# Patient Record
Sex: Female | Born: 1945 | Race: White | Hispanic: No | Marital: Married | Smoking: Never smoker
Health system: Southern US, Community
[De-identification: ages and names within clinical notes are randomized; demographics above are authoritative.]

## PROBLEM LIST (undated history)

## (undated) ENCOUNTER — Emergency Department (HOSPITAL_BASED_OUTPATIENT_CLINIC_OR_DEPARTMENT_OTHER): Payer: Medicare Other | Source: Home / Self Care

## (undated) DIAGNOSIS — T884XXA Failed or difficult intubation, initial encounter: Secondary | ICD-10-CM

## (undated) DIAGNOSIS — C4339 Malignant melanoma of other parts of face: Secondary | ICD-10-CM

## (undated) DIAGNOSIS — E039 Hypothyroidism, unspecified: Secondary | ICD-10-CM

## (undated) DIAGNOSIS — G4700 Insomnia, unspecified: Secondary | ICD-10-CM

## (undated) DIAGNOSIS — I639 Cerebral infarction, unspecified: Secondary | ICD-10-CM

## (undated) DIAGNOSIS — I10 Essential (primary) hypertension: Secondary | ICD-10-CM

## (undated) DIAGNOSIS — D45 Polycythemia vera: Secondary | ICD-10-CM

## (undated) DIAGNOSIS — I491 Atrial premature depolarization: Secondary | ICD-10-CM

## (undated) DIAGNOSIS — I358 Other nonrheumatic aortic valve disorders: Secondary | ICD-10-CM

## (undated) DIAGNOSIS — R0609 Other forms of dyspnea: Secondary | ICD-10-CM

## (undated) DIAGNOSIS — K219 Gastro-esophageal reflux disease without esophagitis: Secondary | ICD-10-CM

## (undated) DIAGNOSIS — M797 Fibromyalgia: Secondary | ICD-10-CM

## (undated) DIAGNOSIS — G8929 Other chronic pain: Secondary | ICD-10-CM

## (undated) DIAGNOSIS — J45909 Unspecified asthma, uncomplicated: Secondary | ICD-10-CM

## (undated) DIAGNOSIS — R569 Unspecified convulsions: Secondary | ICD-10-CM

## (undated) DIAGNOSIS — I6529 Occlusion and stenosis of unspecified carotid artery: Secondary | ICD-10-CM

## (undated) DIAGNOSIS — K5909 Other constipation: Secondary | ICD-10-CM

## (undated) DIAGNOSIS — E785 Hyperlipidemia, unspecified: Secondary | ICD-10-CM

## (undated) DIAGNOSIS — M549 Dorsalgia, unspecified: Secondary | ICD-10-CM

## (undated) DIAGNOSIS — G40209 Localization-related (focal) (partial) symptomatic epilepsy and epileptic syndromes with complex partial seizures, not intractable, without status epilepticus: Secondary | ICD-10-CM

## (undated) DIAGNOSIS — Z9289 Personal history of other medical treatment: Secondary | ICD-10-CM

## (undated) DIAGNOSIS — R0600 Dyspnea, unspecified: Secondary | ICD-10-CM

## (undated) DIAGNOSIS — Z9889 Other specified postprocedural states: Secondary | ICD-10-CM

## (undated) DIAGNOSIS — K810 Acute cholecystitis: Principal | ICD-10-CM

## (undated) DIAGNOSIS — R112 Nausea with vomiting, unspecified: Secondary | ICD-10-CM

## (undated) DIAGNOSIS — Z780 Asymptomatic menopausal state: Secondary | ICD-10-CM

## (undated) DIAGNOSIS — F419 Anxiety disorder, unspecified: Secondary | ICD-10-CM

## (undated) DIAGNOSIS — J302 Other seasonal allergic rhinitis: Secondary | ICD-10-CM

## (undated) DIAGNOSIS — D649 Anemia, unspecified: Secondary | ICD-10-CM

## (undated) DIAGNOSIS — R011 Cardiac murmur, unspecified: Secondary | ICD-10-CM

## (undated) HISTORY — DX: Atrial premature depolarization: I49.1

## (undated) HISTORY — DX: Polycythemia vera: D45

## (undated) HISTORY — DX: Occlusion and stenosis of unspecified carotid artery: I65.29

## (undated) HISTORY — DX: Cerebral infarction, unspecified: I63.9

## (undated) HISTORY — PX: TONSILLECTOMY: SUR1361

## (undated) HISTORY — DX: Cardiac murmur, unspecified: R01.1

## (undated) HISTORY — DX: Asymptomatic menopausal state: Z78.0

## (undated) HISTORY — PX: COLONOSCOPY: SHX174

## (undated) HISTORY — DX: Anemia, unspecified: D64.9

## (undated) HISTORY — PX: NASAL SEPTUM SURGERY: SHX37

## (undated) HISTORY — DX: Hypothyroidism, unspecified: E03.9

## (undated) HISTORY — PX: OTHER SURGICAL HISTORY: SHX169

## (undated) HISTORY — DX: Gastro-esophageal reflux disease without esophagitis: K21.9

## (undated) HISTORY — DX: Unspecified convulsions: R56.9

## (undated) HISTORY — DX: Acute cholecystitis: K81.0

## (undated) HISTORY — DX: Other nonrheumatic aortic valve disorders: I35.8

## (undated) HISTORY — DX: Hyperlipidemia, unspecified: E78.5

## (undated) HISTORY — DX: Fibromyalgia: M79.7

## (undated) HISTORY — DX: Essential (primary) hypertension: I10

## (undated) HISTORY — PX: TUBAL LIGATION: SHX77

## (undated) HISTORY — DX: Unspecified asthma, uncomplicated: J45.909

## (undated) HISTORY — DX: Localization-related (focal) (partial) symptomatic epilepsy and epileptic syndromes with complex partial seizures, not intractable, without status epilepticus: G40.209

## (undated) HISTORY — DX: Malignant melanoma of other parts of face: C43.39

## (undated) SURGERY — COLONOSCOPY
Anesthesia: Moderate Sedation

---

## 1994-03-29 HISTORY — PX: ABDOMINAL HYSTERECTOMY: SHX81

## 1998-08-08 ENCOUNTER — Ambulatory Visit (HOSPITAL_COMMUNITY): Admission: RE | Admit: 1998-08-08 | Discharge: 1998-08-08 | Payer: Self-pay | Admitting: Gastroenterology

## 1999-03-30 DIAGNOSIS — C4339 Malignant melanoma of other parts of face: Secondary | ICD-10-CM

## 1999-03-30 HISTORY — DX: Malignant melanoma of other parts of face: C43.39

## 2000-04-08 ENCOUNTER — Encounter: Admission: RE | Admit: 2000-04-08 | Discharge: 2000-04-08 | Payer: Self-pay | Admitting: Hematology & Oncology

## 2000-04-08 ENCOUNTER — Encounter: Payer: Self-pay | Admitting: Hematology & Oncology

## 2003-01-02 ENCOUNTER — Inpatient Hospital Stay (HOSPITAL_COMMUNITY): Admission: AD | Admit: 2003-01-02 | Discharge: 2003-01-04 | Payer: Self-pay | Admitting: Hematology & Oncology

## 2003-01-02 ENCOUNTER — Encounter: Payer: Self-pay | Admitting: Hematology & Oncology

## 2003-04-05 ENCOUNTER — Inpatient Hospital Stay (HOSPITAL_COMMUNITY): Admission: AD | Admit: 2003-04-05 | Discharge: 2003-04-07 | Payer: Self-pay | Admitting: Hematology & Oncology

## 2003-05-06 ENCOUNTER — Ambulatory Visit (HOSPITAL_COMMUNITY): Admission: RE | Admit: 2003-05-06 | Discharge: 2003-05-06 | Payer: Self-pay | Admitting: Infectious Diseases

## 2003-05-06 ENCOUNTER — Encounter: Admission: RE | Admit: 2003-05-06 | Discharge: 2003-05-06 | Payer: Self-pay | Admitting: Infectious Diseases

## 2003-05-09 ENCOUNTER — Ambulatory Visit (HOSPITAL_COMMUNITY): Admission: RE | Admit: 2003-05-09 | Discharge: 2003-05-09 | Payer: Self-pay | Admitting: Infectious Diseases

## 2003-05-21 ENCOUNTER — Encounter: Admission: RE | Admit: 2003-05-21 | Discharge: 2003-05-21 | Payer: Self-pay | Admitting: Infectious Diseases

## 2003-06-12 ENCOUNTER — Encounter: Admission: RE | Admit: 2003-06-12 | Discharge: 2003-06-12 | Payer: Self-pay | Admitting: Infectious Diseases

## 2003-07-10 ENCOUNTER — Encounter: Admission: RE | Admit: 2003-07-10 | Discharge: 2003-07-10 | Payer: Self-pay | Admitting: Cardiology

## 2003-10-02 ENCOUNTER — Inpatient Hospital Stay (HOSPITAL_COMMUNITY): Admission: RE | Admit: 2003-10-02 | Discharge: 2003-10-04 | Payer: Self-pay | Admitting: Hematology & Oncology

## 2004-01-28 ENCOUNTER — Ambulatory Visit: Payer: Self-pay | Admitting: Hematology & Oncology

## 2004-03-16 ENCOUNTER — Ambulatory Visit: Payer: Self-pay | Admitting: Hematology & Oncology

## 2004-04-01 ENCOUNTER — Inpatient Hospital Stay (HOSPITAL_COMMUNITY): Admission: AD | Admit: 2004-04-01 | Discharge: 2004-04-03 | Payer: Self-pay | Admitting: Hematology & Oncology

## 2004-04-03 ENCOUNTER — Ambulatory Visit: Payer: Self-pay | Admitting: Hematology & Oncology

## 2004-05-13 ENCOUNTER — Ambulatory Visit: Payer: Self-pay | Admitting: Hematology & Oncology

## 2004-07-07 ENCOUNTER — Ambulatory Visit: Payer: Self-pay | Admitting: Hematology & Oncology

## 2004-09-01 ENCOUNTER — Ambulatory Visit: Payer: Self-pay | Admitting: Hematology & Oncology

## 2004-09-30 ENCOUNTER — Inpatient Hospital Stay (HOSPITAL_COMMUNITY): Admission: AD | Admit: 2004-09-30 | Discharge: 2004-10-02 | Payer: Self-pay | Admitting: Hematology & Oncology

## 2004-10-05 ENCOUNTER — Ambulatory Visit: Payer: Self-pay | Admitting: Hematology & Oncology

## 2004-11-03 ENCOUNTER — Ambulatory Visit: Payer: Self-pay | Admitting: Hematology & Oncology

## 2004-12-29 ENCOUNTER — Ambulatory Visit: Payer: Self-pay | Admitting: Hematology & Oncology

## 2005-01-01 ENCOUNTER — Ambulatory Visit (HOSPITAL_COMMUNITY): Admission: RE | Admit: 2005-01-01 | Discharge: 2005-01-01 | Payer: Self-pay | Admitting: Hematology & Oncology

## 2005-02-24 ENCOUNTER — Ambulatory Visit: Payer: Self-pay | Admitting: Hematology & Oncology

## 2005-03-31 ENCOUNTER — Inpatient Hospital Stay (HOSPITAL_COMMUNITY): Admission: EM | Admit: 2005-03-31 | Discharge: 2005-04-02 | Payer: Self-pay | Admitting: Hematology & Oncology

## 2005-03-31 ENCOUNTER — Ambulatory Visit: Payer: Self-pay | Admitting: Hematology & Oncology

## 2005-05-18 ENCOUNTER — Ambulatory Visit: Payer: Self-pay | Admitting: Hematology & Oncology

## 2005-06-30 LAB — CBC WITH DIFFERENTIAL/PLATELET
BASO%: 1.5 % (ref 0.0–2.0)
EOS%: 4.7 % (ref 0.0–7.0)
MCH: 14.2 pg — ABNORMAL LOW (ref 26.0–34.0)
MCHC: 25.9 g/dL — ABNORMAL LOW (ref 32.0–36.0)
MONO#: 0.5 10*3/uL (ref 0.1–0.9)
RBC: 4.48 10*6/uL (ref 3.70–5.32)
RDW: 17.3 % — ABNORMAL HIGH (ref 11.3–14.5)
WBC: 4.5 10*3/uL (ref 3.9–10.0)
lymph#: 0.8 10*3/uL — ABNORMAL LOW (ref 0.9–3.3)

## 2005-07-13 ENCOUNTER — Ambulatory Visit: Payer: Self-pay | Admitting: Hematology & Oncology

## 2005-07-14 LAB — COMPREHENSIVE METABOLIC PANEL
AST: 12 U/L (ref 0–37)
Albumin: 4.7 g/dL (ref 3.5–5.2)
Alkaline Phosphatase: 59 U/L (ref 39–117)
Potassium: 4.2 mEq/L (ref 3.5–5.3)
Sodium: 136 mEq/L (ref 135–145)
Total Bilirubin: 0.5 mg/dL (ref 0.3–1.2)
Total Protein: 7.2 g/dL (ref 6.0–8.3)

## 2005-07-14 LAB — CBC WITH DIFFERENTIAL/PLATELET
Basophils Absolute: 0 10*3/uL (ref 0.0–0.1)
EOS%: 3.9 % (ref 0.0–7.0)
HGB: 6.7 g/dL — CL (ref 11.6–15.9)
MCH: 14.1 pg — ABNORMAL LOW (ref 26.0–34.0)
MONO#: 0.6 10*3/uL (ref 0.1–0.9)
NEUT#: 2.8 10*3/uL (ref 1.5–6.5)
RDW: 16.9 % — ABNORMAL HIGH (ref 11.3–14.5)
WBC: 4.5 10*3/uL (ref 3.9–10.0)
lymph#: 0.9 10*3/uL (ref 0.9–3.3)

## 2005-07-28 LAB — CBC WITH DIFFERENTIAL/PLATELET
Basophils Absolute: 0 10*3/uL (ref 0.0–0.1)
EOS%: 8.5 % — ABNORMAL HIGH (ref 0.0–7.0)
Eosinophils Absolute: 0.4 10*3/uL (ref 0.0–0.5)
HCT: 24.3 % — ABNORMAL LOW (ref 34.8–46.6)
HGB: 6.4 g/dL — CL (ref 11.6–15.9)
MCH: 14.5 pg — ABNORMAL LOW (ref 26.0–34.0)
MCV: 55.1 fL — ABNORMAL LOW (ref 81.0–101.0)
MONO%: 12.9 % (ref 0.0–13.0)
NEUT#: 2.9 10*3/uL (ref 1.5–6.5)
NEUT%: 61 % (ref 39.6–76.8)
Platelets: 213 10*3/uL (ref 145–400)

## 2005-08-11 LAB — CBC WITH DIFFERENTIAL/PLATELET
Basophils Absolute: 0.1 10*3/uL (ref 0.0–0.1)
EOS%: 9.6 % — ABNORMAL HIGH (ref 0.0–7.0)
Eosinophils Absolute: 0.5 10*3/uL (ref 0.0–0.5)
LYMPH%: 16.3 % (ref 14.0–48.0)
MCH: 14.7 pg — ABNORMAL LOW (ref 26.0–34.0)
MCV: 55.3 fL — ABNORMAL LOW (ref 81.0–101.0)
MONO%: 9.4 % (ref 0.0–13.0)
NEUT#: 3.5 10*3/uL (ref 1.5–6.5)
Platelets: 332 10*3/uL (ref 145–400)
RBC: 4.88 10*6/uL (ref 3.70–5.32)
RDW: 17.5 % — ABNORMAL HIGH (ref 11.3–14.5)

## 2005-08-21 ENCOUNTER — Ambulatory Visit: Payer: Self-pay | Admitting: Hematology & Oncology

## 2005-08-26 LAB — CBC WITH DIFFERENTIAL/PLATELET
BASO%: 0 % (ref 0.0–2.0)
LYMPH%: 12.2 % — ABNORMAL LOW (ref 14.0–48.0)
MCHC: 27.9 g/dL — ABNORMAL LOW (ref 32.0–36.0)
MCV: 54.7 fL — ABNORMAL LOW (ref 81.0–101.0)
MONO%: 12.5 % (ref 0.0–13.0)
NEUT#: 3.6 10*3/uL (ref 1.5–6.5)
Platelets: 285 10*3/uL (ref 145–400)
RBC: 4.16 10*6/uL (ref 3.70–5.32)
RDW: 23.5 % — ABNORMAL HIGH (ref 11.3–14.5)
WBC: 5.1 10*3/uL (ref 3.9–10.0)

## 2005-08-26 LAB — TECHNOLOGIST REVIEW

## 2005-09-08 LAB — CBC WITH DIFFERENTIAL/PLATELET
BASO%: 0 % (ref 0.0–2.0)
Basophils Absolute: 0 10*3/uL (ref 0.0–0.1)
EOS%: 2.9 % (ref 0.0–7.0)
HGB: 6.4 g/dL — CL (ref 11.6–15.9)
MCH: 15.3 pg — ABNORMAL LOW (ref 26.0–34.0)
MCV: 54.8 fL — ABNORMAL LOW (ref 81.0–101.0)
MONO%: 15.8 % — ABNORMAL HIGH (ref 0.0–13.0)
RBC: 4.22 10*6/uL (ref 3.70–5.32)
RDW: 23.5 % — ABNORMAL HIGH (ref 11.3–14.5)
lymph#: 0.8 10*3/uL — ABNORMAL LOW (ref 0.9–3.3)

## 2005-09-08 LAB — TECHNOLOGIST REVIEW

## 2005-09-22 LAB — CBC WITH DIFFERENTIAL/PLATELET
BASO%: 0.7 % (ref 0.0–2.0)
Eosinophils Absolute: 0.2 10*3/uL (ref 0.0–0.5)
LYMPH%: 16.5 % (ref 14.0–48.0)
MCHC: 25.5 g/dL — ABNORMAL LOW (ref 32.0–36.0)
MONO#: 0.5 10*3/uL (ref 0.1–0.9)
NEUT#: 3.1 10*3/uL (ref 1.5–6.5)
Platelets: 267 10*3/uL (ref 145–400)
RBC: 4.53 10*6/uL (ref 3.70–5.32)
RDW: 16.6 % — ABNORMAL HIGH (ref 11.3–14.5)
WBC: 4.6 10*3/uL (ref 3.9–10.0)
lymph#: 0.8 10*3/uL — ABNORMAL LOW (ref 0.9–3.3)

## 2005-09-27 ENCOUNTER — Ambulatory Visit: Payer: Self-pay | Admitting: Hematology & Oncology

## 2005-09-27 ENCOUNTER — Inpatient Hospital Stay (HOSPITAL_COMMUNITY): Admission: EM | Admit: 2005-09-27 | Discharge: 2005-09-29 | Payer: Self-pay | Admitting: Hematology & Oncology

## 2005-10-06 ENCOUNTER — Ambulatory Visit: Payer: Self-pay | Admitting: Hematology & Oncology

## 2005-10-06 LAB — CBC WITH DIFFERENTIAL/PLATELET
BASO%: 1.2 % (ref 0.0–2.0)
Basophils Absolute: 0.1 10*3/uL (ref 0.0–0.1)
EOS%: 6.3 % (ref 0.0–7.0)
HCT: 25 % — ABNORMAL LOW (ref 34.8–46.6)
HGB: 6.6 g/dL — CL (ref 11.6–15.9)
LYMPH%: 18.6 % (ref 14.0–48.0)
MCH: 14.6 pg — ABNORMAL LOW (ref 26.0–34.0)
MCHC: 26.6 g/dL — ABNORMAL LOW (ref 32.0–36.0)
MCV: 55 fL — ABNORMAL LOW (ref 81.0–101.0)
NEUT%: 60.5 % (ref 39.6–76.8)
Platelets: 325 10*3/uL (ref 145–400)

## 2005-10-20 LAB — CBC WITH DIFFERENTIAL/PLATELET
BASO%: 0.7 % (ref 0.0–2.0)
Basophils Absolute: 0 10*3/uL (ref 0.0–0.1)
EOS%: 4.3 % (ref 0.0–7.0)
MCH: 13.8 pg — ABNORMAL LOW (ref 26.0–34.0)
MCHC: 25.3 g/dL — ABNORMAL LOW (ref 32.0–36.0)
MCV: 54.7 fL — ABNORMAL LOW (ref 81.0–101.0)
MONO%: 10.8 % (ref 0.0–13.0)
NEUT%: 68.6 % (ref 39.6–76.8)
RDW: 16.2 % — ABNORMAL HIGH (ref 11.3–14.5)
lymph#: 0.8 10*3/uL — ABNORMAL LOW (ref 0.9–3.3)

## 2005-11-03 LAB — CBC WITH DIFFERENTIAL/PLATELET
Basophils Absolute: 0 10*3/uL (ref 0.0–0.1)
Eosinophils Absolute: 0.3 10*3/uL (ref 0.0–0.5)
HGB: 5.9 g/dL — CL (ref 11.6–15.9)
MCV: 55.2 fL — ABNORMAL LOW (ref 81.0–101.0)
MONO#: 0.5 10*3/uL (ref 0.1–0.9)
MONO%: 12 % (ref 0.0–13.0)
NEUT#: 2.6 10*3/uL (ref 1.5–6.5)
RBC: 4.38 10*6/uL (ref 3.70–5.32)
RDW: 16.7 % — ABNORMAL HIGH (ref 11.3–14.5)
WBC: 4.4 10*3/uL (ref 3.9–10.0)
lymph#: 0.9 10*3/uL (ref 0.9–3.3)

## 2005-11-17 LAB — CBC WITH DIFFERENTIAL/PLATELET
Basophils Absolute: 0.1 10*3/uL (ref 0.0–0.1)
Eosinophils Absolute: 0.2 10*3/uL (ref 0.0–0.5)
HGB: 6.6 g/dL — CL (ref 11.6–15.9)
LYMPH%: 14.6 % (ref 14.0–48.0)
MCV: 55 fL — ABNORMAL LOW (ref 81.0–101.0)
MONO#: 0.7 10*3/uL (ref 0.1–0.9)
MONO%: 13 % (ref 0.0–13.0)
NEUT#: 3.5 10*3/uL (ref 1.5–6.5)
Platelets: 294 10*3/uL (ref 145–400)
RBC: 4.75 10*6/uL (ref 3.70–5.32)
WBC: 5.1 10*3/uL (ref 3.9–10.0)

## 2005-11-30 ENCOUNTER — Ambulatory Visit: Payer: Self-pay | Admitting: Hematology & Oncology

## 2005-12-01 LAB — CBC WITH DIFFERENTIAL/PLATELET
Eosinophils Absolute: 0.3 10*3/uL (ref 0.0–0.5)
HCT: 26.3 % — ABNORMAL LOW (ref 34.8–46.6)
LYMPH%: 18.3 % (ref 14.0–48.0)
MCHC: 25.5 g/dL — ABNORMAL LOW (ref 32.0–36.0)
MCV: 55.4 fL — ABNORMAL LOW (ref 81.0–101.0)
MONO#: 0.6 10*3/uL (ref 0.1–0.9)
MONO%: 12.9 % (ref 0.0–13.0)
NEUT#: 3 10*3/uL (ref 1.5–6.5)
NEUT%: 61.3 % (ref 39.6–76.8)
Platelets: 282 10*3/uL (ref 145–400)
WBC: 4.9 10*3/uL (ref 3.9–10.0)

## 2005-12-15 LAB — CBC WITH DIFFERENTIAL/PLATELET
BASO%: 1.2 % (ref 0.0–2.0)
EOS%: 6.7 % (ref 0.0–7.0)
HCT: 26.9 % — ABNORMAL LOW (ref 34.8–46.6)
LYMPH%: 15.4 % (ref 14.0–48.0)
MCH: 13.6 pg — ABNORMAL LOW (ref 26.0–34.0)
MCHC: 25 g/dL — ABNORMAL LOW (ref 32.0–36.0)
MONO%: 8.9 % (ref 0.0–13.0)
NEUT%: 67.8 % (ref 39.6–76.8)
Platelets: 281 10*3/uL (ref 145–400)
RBC: 4.93 10*6/uL (ref 3.70–5.32)

## 2006-01-24 ENCOUNTER — Ambulatory Visit: Payer: Self-pay | Admitting: Hematology & Oncology

## 2006-02-09 LAB — CBC WITH DIFFERENTIAL/PLATELET
BASO%: 1.1 % (ref 0.0–2.0)
EOS%: 7.9 % — ABNORMAL HIGH (ref 0.0–7.0)
HCT: 25.4 % — ABNORMAL LOW (ref 34.8–46.6)
LYMPH%: 14.9 % (ref 14.0–48.0)
MCH: 13.8 pg — ABNORMAL LOW (ref 26.0–34.0)
MCHC: 24.5 g/dL — ABNORMAL LOW (ref 32.0–36.0)
MCV: 56.2 fL — ABNORMAL LOW (ref 81.0–101.0)
MONO%: 10.9 % (ref 0.0–13.0)
NEUT%: 65.2 % (ref 39.6–76.8)
lymph#: 0.7 10*3/uL — ABNORMAL LOW (ref 0.9–3.3)

## 2006-02-24 ENCOUNTER — Ambulatory Visit: Payer: Self-pay | Admitting: Hematology & Oncology

## 2006-02-24 LAB — CBC & DIFF AND RETIC
Basophils Absolute: 0 10*3/uL (ref 0.0–0.1)
EOS%: 5.4 % (ref 0.0–7.0)
Eosinophils Absolute: 0.3 10*3/uL (ref 0.0–0.5)
HCT: 26.6 % — ABNORMAL LOW (ref 34.8–46.6)
HGB: 6.6 g/dL — CL (ref 11.6–15.9)
IRF: 0.45 — ABNORMAL HIGH (ref 0.130–0.330)
MCH: 13.8 pg — ABNORMAL LOW (ref 26.0–34.0)
NEUT#: 3.4 10*3/uL (ref 1.5–6.5)
NEUT%: 67.5 % (ref 39.6–76.8)
RDW: 16 % — ABNORMAL HIGH (ref 11.3–14.5)
RETIC #: 89.8 10*3/uL (ref 19.7–115.1)
lymph#: 0.8 10*3/uL — ABNORMAL LOW (ref 0.9–3.3)

## 2006-02-24 LAB — FERRITIN: Ferritin: <1 ng/mL — ABNORMAL LOW (ref 10–291)

## 2006-03-09 LAB — CBC WITH DIFFERENTIAL/PLATELET
Eosinophils Absolute: 0.4 10*3/uL (ref 0.0–0.5)
HCT: 27.6 % — ABNORMAL LOW (ref 34.8–46.6)
LYMPH%: 17 % (ref 14.0–48.0)
MONO#: 0.7 10*3/uL (ref 0.1–0.9)
NEUT#: 3.6 10*3/uL (ref 1.5–6.5)
NEUT%: 64.4 % (ref 39.6–76.8)
Platelets: 316 10*3/uL (ref 145–400)
WBC: 5.6 10*3/uL (ref 3.9–10.0)

## 2006-03-23 LAB — CBC WITH DIFFERENTIAL/PLATELET
BASO%: 1 % (ref 0.0–2.0)
EOS%: 8.8 % — ABNORMAL HIGH (ref 0.0–7.0)
HCT: 24.1 % — ABNORMAL LOW (ref 34.8–46.6)
LYMPH%: 14.8 % (ref 14.0–48.0)
MCH: 13.8 pg — ABNORMAL LOW (ref 26.0–34.0)
MCHC: 25 g/dL — ABNORMAL LOW (ref 32.0–36.0)
MONO#: 0.6 10*3/uL (ref 0.1–0.9)
NEUT%: 66.5 % (ref 39.6–76.8)
Platelets: 305 10*3/uL (ref 145–400)

## 2006-03-30 ENCOUNTER — Ambulatory Visit: Payer: Self-pay | Admitting: Hematology & Oncology

## 2006-03-30 ENCOUNTER — Inpatient Hospital Stay (HOSPITAL_COMMUNITY): Admission: EM | Admit: 2006-03-30 | Discharge: 2006-04-01 | Payer: Self-pay | Admitting: Hematology & Oncology

## 2006-04-06 ENCOUNTER — Ambulatory Visit: Payer: Self-pay | Admitting: Hematology & Oncology

## 2006-04-06 LAB — CBC WITH DIFFERENTIAL/PLATELET
BASO%: 0.6 % (ref 0.0–2.0)
Basophils Absolute: 0 10*3/uL (ref 0.0–0.1)
HCT: 24 % — ABNORMAL LOW (ref 34.8–46.6)
HGB: 5.9 g/dL — CL (ref 11.6–15.9)
LYMPH%: 17 % (ref 14.0–48.0)
MCH: 13.5 pg — ABNORMAL LOW (ref 26.0–34.0)
MCHC: 24.4 g/dL — ABNORMAL LOW (ref 32.0–36.0)
MONO#: 0.6 10*3/uL (ref 0.1–0.9)
NEUT%: 59 % (ref 39.6–76.8)
Platelets: 268 10*3/uL (ref 145–400)
WBC: 5 10*3/uL (ref 3.9–10.0)

## 2006-04-20 LAB — CBC WITH DIFFERENTIAL/PLATELET
BASO%: 0.6 % (ref 0.0–2.0)
Basophils Absolute: 0 10*3/uL (ref 0.0–0.1)
EOS%: 10.3 % — ABNORMAL HIGH (ref 0.0–7.0)
HCT: 26.1 % — ABNORMAL LOW (ref 34.8–46.6)
LYMPH%: 19.7 % (ref 14.0–48.0)
MCH: 13.6 pg — ABNORMAL LOW (ref 26.0–34.0)
MCHC: 24 g/dL — ABNORMAL LOW (ref 32.0–36.0)
MCV: 56.5 fL — ABNORMAL LOW (ref 81.0–101.0)
MONO%: 12.5 % (ref 0.0–13.0)
NEUT%: 56.8 % (ref 39.6–76.8)
lymph#: 0.8 10*3/uL — ABNORMAL LOW (ref 0.9–3.3)

## 2006-05-04 LAB — CBC WITH DIFFERENTIAL/PLATELET
EOS%: 9.6 % — ABNORMAL HIGH (ref 0.0–7.0)
Eosinophils Absolute: 0.4 10*3/uL (ref 0.0–0.5)
MCV: 56.3 fL — ABNORMAL LOW (ref 81.0–101.0)
MONO%: 11.2 % (ref 0.0–13.0)
NEUT#: 2.7 10*3/uL (ref 1.5–6.5)
RBC: 4.8 10*6/uL (ref 3.70–5.32)
RDW: 15.7 % — ABNORMAL HIGH (ref 11.3–14.5)
WBC: 4.4 10*3/uL (ref 3.9–10.0)

## 2006-05-16 ENCOUNTER — Ambulatory Visit: Payer: Self-pay | Admitting: Hematology & Oncology

## 2006-05-18 LAB — CBC WITH DIFFERENTIAL/PLATELET
Eosinophils Absolute: 0.4 10*3/uL (ref 0.0–0.5)
MONO#: 0.6 10*3/uL (ref 0.1–0.9)
NEUT#: 3.2 10*3/uL (ref 1.5–6.5)
Platelets: 257 10*3/uL (ref 145–400)
RBC: 4.75 10*6/uL (ref 3.70–5.32)
RDW: 16.1 % — ABNORMAL HIGH (ref 11.3–14.5)
WBC: 5 10*3/uL (ref 3.9–10.0)
lymph#: 0.8 10*3/uL — ABNORMAL LOW (ref 0.9–3.3)

## 2006-05-18 LAB — COMPREHENSIVE METABOLIC PANEL
Albumin: 4.2 g/dL (ref 3.5–5.2)
CO2: 27 mEq/L (ref 19–32)
Chloride: 103 mEq/L (ref 96–112)
Glucose, Bld: 88 mg/dL (ref 70–99)
Potassium: 4.1 mEq/L (ref 3.5–5.3)
Sodium: 137 mEq/L (ref 135–145)
Total Protein: 6.5 g/dL (ref 6.0–8.3)

## 2006-05-18 LAB — FERRITIN: Ferritin: <1 ng/mL — ABNORMAL LOW (ref 10–291)

## 2006-06-01 LAB — CBC WITH DIFFERENTIAL/PLATELET
BASO%: 0.5 % (ref 0.0–2.0)
EOS%: 11.3 % — ABNORMAL HIGH (ref 0.0–7.0)
LYMPH%: 17.6 % (ref 14.0–48.0)
MCH: 14.1 pg — ABNORMAL LOW (ref 26.0–34.0)
MCHC: 24.9 g/dL — ABNORMAL LOW (ref 32.0–36.0)
MONO#: 0.5 10*3/uL (ref 0.1–0.9)
RBC: 4.21 10*6/uL (ref 3.70–5.32)
WBC: 5 10*3/uL (ref 3.9–10.0)
lymph#: 0.9 10*3/uL (ref 0.9–3.3)

## 2006-06-15 LAB — CBC WITH DIFFERENTIAL/PLATELET
Basophils Absolute: 0 10*3/uL (ref 0.0–0.1)
Eosinophils Absolute: 0.3 10*3/uL (ref 0.0–0.5)
HGB: 6.4 g/dL — CL (ref 11.6–15.9)
MONO#: 0.5 10*3/uL (ref 0.1–0.9)
NEUT#: 3.9 10*3/uL (ref 1.5–6.5)
RDW: 16 % — ABNORMAL HIGH (ref 11.3–14.5)
lymph#: 0.7 10*3/uL — ABNORMAL LOW (ref 0.9–3.3)

## 2006-06-27 ENCOUNTER — Ambulatory Visit: Payer: Self-pay | Admitting: Hematology & Oncology

## 2006-06-29 LAB — CBC WITH DIFFERENTIAL/PLATELET
Basophils Absolute: 0 10*3/uL (ref 0.0–0.1)
Eosinophils Absolute: 0.3 10*3/uL (ref 0.0–0.5)
HCT: 27.1 % — ABNORMAL LOW (ref 34.8–46.6)
HGB: 6.8 g/dL — CL (ref 11.6–15.9)
MCV: 56.1 fL — ABNORMAL LOW (ref 81.0–101.0)
MONO%: 8.8 % (ref 0.0–13.0)
NEUT#: 3.7 10*3/uL (ref 1.5–6.5)
Platelets: 272 10*3/uL (ref 145–400)
RDW: 15.2 % — ABNORMAL HIGH (ref 11.3–14.5)

## 2006-07-13 LAB — CBC WITH DIFFERENTIAL/PLATELET
Basophils Absolute: 0.1 10*3/uL (ref 0.0–0.1)
Eosinophils Absolute: 0.5 10*3/uL (ref 0.0–0.5)
LYMPH%: 15.5 % (ref 14.0–48.0)
MCV: 56 fL — ABNORMAL LOW (ref 81.0–101.0)
MONO%: 11.2 % (ref 0.0–13.0)
NEUT#: 3.8 10*3/uL (ref 1.5–6.5)
Platelets: 322 10*3/uL (ref 145–400)
RBC: 5.13 10*6/uL (ref 3.70–5.32)

## 2006-07-27 LAB — CBC WITH DIFFERENTIAL/PLATELET
BASO%: 0.7 % (ref 0.0–2.0)
EOS%: 7.7 % — ABNORMAL HIGH (ref 0.0–7.0)
LYMPH%: 18.1 % (ref 14.0–48.0)
MCH: 13.5 pg — ABNORMAL LOW (ref 26.0–34.0)
MCHC: 24.3 g/dL — ABNORMAL LOW (ref 32.0–36.0)
MCV: 55.5 fL — ABNORMAL LOW (ref 81.0–101.0)
MONO%: 10.6 % (ref 0.0–13.0)
Platelets: 264 10*3/uL (ref 145–400)
RBC: 4.66 10*6/uL (ref 3.70–5.32)
WBC: 5.2 10*3/uL (ref 3.9–10.0)

## 2006-08-09 ENCOUNTER — Ambulatory Visit: Payer: Self-pay | Admitting: Hematology & Oncology

## 2006-08-10 LAB — CBC WITH DIFFERENTIAL/PLATELET
BASO%: 0.5 % (ref 0.0–2.0)
EOS%: 8.6 % — ABNORMAL HIGH (ref 0.0–7.0)
MCHC: 24.8 g/dL — ABNORMAL LOW (ref 32.0–36.0)
MONO#: 0.5 10*3/uL (ref 0.1–0.9)
RBC: 4.7 10*6/uL (ref 3.70–5.32)
WBC: 4.8 10*3/uL (ref 3.9–10.0)
lymph#: 0.8 10*3/uL — ABNORMAL LOW (ref 0.9–3.3)

## 2006-08-17 LAB — CBC WITH DIFFERENTIAL/PLATELET
BASO%: 0.6 % (ref 0.0–2.0)
Basophils Absolute: 0 10*3/uL (ref 0.0–0.1)
EOS%: 6.2 % (ref 0.0–7.0)
HCT: 25.9 % — ABNORMAL LOW (ref 34.8–46.6)
HGB: 6.4 g/dL — CL (ref 11.6–15.9)
LYMPH%: 21.9 % (ref 14.0–48.0)
MCH: 13.9 pg — ABNORMAL LOW (ref 26.0–34.0)
MCHC: 24.7 g/dL — ABNORMAL LOW (ref 32.0–36.0)
NEUT%: 59 % (ref 39.6–76.8)
Platelets: 336 10*3/uL (ref 145–400)
lymph#: 0.9 10*3/uL (ref 0.9–3.3)

## 2006-08-24 LAB — CBC WITH DIFFERENTIAL/PLATELET
Basophils Absolute: 0 10*3/uL (ref 0.0–0.1)
EOS%: 4 % (ref 0.0–7.0)
Eosinophils Absolute: 0.2 10*3/uL (ref 0.0–0.5)
HGB: 7.1 g/dL — ABNORMAL LOW (ref 11.6–15.9)
LYMPH%: 17.1 % (ref 14.0–48.0)
MCH: 14 pg — ABNORMAL LOW (ref 26.0–34.0)
MCV: 55.7 fL — ABNORMAL LOW (ref 81.0–101.0)
MONO%: 9.5 % (ref 0.0–13.0)
NEUT#: 3.9 10*3/uL (ref 1.5–6.5)
Platelets: 388 10*3/uL (ref 145–400)
RBC: 5.11 10*6/uL (ref 3.70–5.32)

## 2006-09-07 LAB — CBC WITH DIFFERENTIAL/PLATELET
BASO%: 0.5 % (ref 0.0–2.0)
EOS%: 5.3 % (ref 0.0–7.0)
LYMPH%: 16.3 % (ref 14.0–48.0)
MCHC: 23.7 g/dL — ABNORMAL LOW (ref 32.0–36.0)
MCV: 54.9 fL — ABNORMAL LOW (ref 81.0–101.0)
MONO%: 9.4 % (ref 0.0–13.0)
Platelets: 306 10*3/uL (ref 145–400)
RBC: 4.14 10*6/uL (ref 3.70–5.32)
RDW: 14.6 % — ABNORMAL HIGH (ref 11.3–14.5)

## 2006-09-21 LAB — CBC WITH DIFFERENTIAL/PLATELET
BASO%: 0.5 % (ref 0.0–2.0)
Basophils Absolute: 0 10*3/uL (ref 0.0–0.1)
EOS%: 4.7 % (ref 0.0–7.0)
Eosinophils Absolute: 0.2 10*3/uL (ref 0.0–0.5)
HCT: 24.5 % — ABNORMAL LOW (ref 34.8–46.6)
HGB: 6.1 g/dL — CL (ref 11.6–15.9)
LYMPH%: 17.8 % (ref 14.0–48.0)
MCH: 13.5 pg — ABNORMAL LOW (ref 26.0–34.0)
MCHC: 24.8 g/dL — ABNORMAL LOW (ref 32.0–36.0)
MCV: 54.4 fL — ABNORMAL LOW (ref 81.0–101.0)
MONO#: 0.5 10*3/uL (ref 0.1–0.9)
MONO%: 10.8 % (ref 0.0–13.0)
NEUT#: 3.2 10*3/uL (ref 1.5–6.5)
NEUT%: 66.1 % (ref 39.6–76.8)
Platelets: 238 10*3/uL (ref 145–400)
RBC: 4.51 10*6/uL (ref 3.70–5.32)
RDW: 14.6 % — ABNORMAL HIGH (ref 11.3–14.5)
WBC: 4.9 10*3/uL (ref 3.9–10.0)
lymph#: 0.9 10*3/uL (ref 0.9–3.3)

## 2006-09-28 ENCOUNTER — Inpatient Hospital Stay (HOSPITAL_COMMUNITY): Admission: EM | Admit: 2006-09-28 | Discharge: 2006-09-30 | Payer: Self-pay | Admitting: Hematology & Oncology

## 2006-09-28 ENCOUNTER — Ambulatory Visit: Payer: Self-pay | Admitting: Hematology & Oncology

## 2006-10-03 ENCOUNTER — Ambulatory Visit: Payer: Self-pay | Admitting: Hematology & Oncology

## 2006-10-05 LAB — CBC WITH DIFFERENTIAL/PLATELET
BASO%: 0.8 % (ref 0.0–2.0)
Eosinophils Absolute: 0.4 10*3/uL (ref 0.0–0.5)
HCT: 26.1 % — ABNORMAL LOW (ref 34.8–46.6)
MCHC: 24.1 g/dL — ABNORMAL LOW (ref 32.0–36.0)
MONO#: 0.3 10*3/uL (ref 0.1–0.9)
NEUT#: 2.3 10*3/uL (ref 1.5–6.5)
NEUT%: 63.4 % (ref 39.6–76.8)
WBC: 3.6 10*3/uL — ABNORMAL LOW (ref 3.9–10.0)
lymph#: 0.6 10*3/uL — ABNORMAL LOW (ref 0.9–3.3)

## 2006-10-19 LAB — CBC WITH DIFFERENTIAL/PLATELET
BASO%: 1 % (ref 0.0–2.0)
EOS%: 4.1 % (ref 0.0–7.0)
MCH: 13.7 pg — ABNORMAL LOW (ref 26.0–34.0)
MCHC: 24.3 g/dL — ABNORMAL LOW (ref 32.0–36.0)
RDW: 15.3 % — ABNORMAL HIGH (ref 11.3–14.5)
lymph#: 0.9 10*3/uL (ref 0.9–3.3)

## 2006-11-02 LAB — CBC WITH DIFFERENTIAL/PLATELET
Basophils Absolute: 0 10*3/uL (ref 0.0–0.1)
EOS%: 5.3 % (ref 0.0–7.0)
Eosinophils Absolute: 0.3 10*3/uL (ref 0.0–0.5)
HGB: 6.6 g/dL — CL (ref 11.6–15.9)
MONO#: 0.6 10*3/uL (ref 0.1–0.9)
NEUT#: 3.5 10*3/uL (ref 1.5–6.5)
RDW: 15.6 % — ABNORMAL HIGH (ref 11.3–14.5)
WBC: 5.2 10*3/uL (ref 3.9–10.0)
lymph#: 0.9 10*3/uL (ref 0.9–3.3)

## 2006-11-16 ENCOUNTER — Ambulatory Visit: Payer: Self-pay | Admitting: Hematology & Oncology

## 2006-11-16 LAB — CBC WITH DIFFERENTIAL/PLATELET
Basophils Absolute: 0.1 10*3/uL (ref 0.0–0.1)
Eosinophils Absolute: 0.2 10*3/uL (ref 0.0–0.5)
HGB: 7.3 g/dL — ABNORMAL LOW (ref 11.6–15.9)
MCV: 57.9 fL — ABNORMAL LOW (ref 81.0–101.0)
MONO%: 12.2 % (ref 0.0–13.0)
NEUT#: 4.6 10*3/uL (ref 1.5–6.5)
RDW: 16.3 % — ABNORMAL HIGH (ref 11.3–14.5)

## 2006-11-30 LAB — CBC WITH DIFFERENTIAL/PLATELET
Basophils Absolute: 0 10*3/uL (ref 0.0–0.1)
Eosinophils Absolute: 0.3 10*3/uL (ref 0.0–0.5)
HGB: 6.9 g/dL — CL (ref 11.6–15.9)
LYMPH%: 20.4 % (ref 14.0–48.0)
MCV: 57.3 fL — ABNORMAL LOW (ref 81.0–101.0)
MONO%: 10.7 % (ref 0.0–13.0)
NEUT#: 3.3 10*3/uL (ref 1.5–6.5)
NEUT%: 62.9 % (ref 39.6–76.8)
Platelets: 294 10*3/uL (ref 145–400)
RBC: 4.66 10*6/uL (ref 3.70–5.32)

## 2006-12-07 LAB — CBC WITH DIFFERENTIAL/PLATELET
BASO%: 1.5 % (ref 0.0–2.0)
LYMPH%: 20.1 % (ref 14.0–48.0)
MCH: 13.9 pg — ABNORMAL LOW (ref 26.0–34.0)
MCHC: 24 g/dL — ABNORMAL LOW (ref 32.0–36.0)
MCV: 57.7 fL — ABNORMAL LOW (ref 81.0–101.0)
MONO%: 13.3 % — ABNORMAL HIGH (ref 0.0–13.0)
Platelets: 253 10*3/uL (ref 145–400)
RBC: 4.19 10*6/uL (ref 3.70–5.32)

## 2006-12-14 LAB — CBC WITH DIFFERENTIAL/PLATELET
BASO%: 0.9 % (ref 0.0–2.0)
Basophils Absolute: 0.1 10*3/uL (ref 0.0–0.1)
Eosinophils Absolute: 0.3 10*3/uL (ref 0.0–0.5)
HCT: 26.1 % — ABNORMAL LOW (ref 34.8–46.6)
HGB: 6.4 g/dL — CL (ref 11.6–15.9)
LYMPH%: 18.5 % (ref 14.0–48.0)
MCHC: 24.4 g/dL — ABNORMAL LOW (ref 32.0–36.0)
MONO#: 0.6 10*3/uL (ref 0.1–0.9)
NEUT%: 63.5 % (ref 39.6–76.8)
Platelets: 304 10*3/uL (ref 145–400)
WBC: 5.4 10*3/uL (ref 3.9–10.0)
lymph#: 1 10*3/uL (ref 0.9–3.3)

## 2006-12-28 LAB — CBC WITH DIFFERENTIAL/PLATELET
Basophils Absolute: 0 10*3/uL (ref 0.0–0.1)
EOS%: 5 % (ref 0.0–7.0)
Eosinophils Absolute: 0.2 10*3/uL (ref 0.0–0.5)
HCT: 27.3 % — ABNORMAL LOW (ref 34.8–46.6)
HGB: 6.8 g/dL — CL (ref 11.6–15.9)
LYMPH%: 20.5 % (ref 14.0–48.0)
MCH: 14.7 pg — ABNORMAL LOW (ref 26.0–34.0)
MCV: 58.8 fL — ABNORMAL LOW (ref 81.0–101.0)
MONO%: 12.4 % (ref 0.0–13.0)
NEUT#: 2.6 10*3/uL (ref 1.5–6.5)
NEUT%: 61.6 % (ref 39.6–76.8)
Platelets: 269 10*3/uL (ref 145–400)

## 2007-01-06 ENCOUNTER — Ambulatory Visit: Payer: Self-pay | Admitting: Hematology & Oncology

## 2007-01-11 LAB — CBC WITH DIFFERENTIAL/PLATELET
EOS%: 5.5 % (ref 0.0–7.0)
Eosinophils Absolute: 0.3 10*3/uL (ref 0.0–0.5)
LYMPH%: 19.4 % (ref 14.0–48.0)
MCH: 14.2 pg — ABNORMAL LOW (ref 26.0–34.0)
MCV: 58.5 fL — ABNORMAL LOW (ref 81.0–101.0)
MONO%: 10 % (ref 0.0–13.0)
Platelets: 300 10*3/uL (ref 145–400)
RBC: 4.47 10*6/uL (ref 3.70–5.32)
RDW: 14.7 % — ABNORMAL HIGH (ref 11.3–14.5)

## 2007-01-25 LAB — CBC & DIFF AND RETIC
BASO%: 0.9 % (ref 0.0–2.0)
Eosinophils Absolute: 0.4 10*3/uL (ref 0.0–0.5)
LYMPH%: 14.8 % (ref 14.0–48.0)
MCHC: 23.9 g/dL — ABNORMAL LOW (ref 32.0–36.0)
MCV: 58.3 fL — ABNORMAL LOW (ref 81.0–101.0)
MONO#: 0.6 10*3/uL (ref 0.1–0.9)
MONO%: 9.5 % (ref 0.0–13.0)
NEUT#: 4 10*3/uL (ref 1.5–6.5)
Platelets: 301 10*3/uL (ref 145–400)
RBC: 4.87 10*6/uL (ref 3.70–5.32)
RDW: 14.3 % (ref 11.3–14.5)
RETIC #: 66.7 10*3/uL (ref 19.7–115.1)
Retic %: 1.4 % (ref 0.4–2.3)
WBC: 5.9 10*3/uL (ref 3.9–10.0)

## 2007-02-08 LAB — CBC WITH DIFFERENTIAL/PLATELET
BASO%: 0.9 % (ref 0.0–2.0)
EOS%: 7.9 % — ABNORMAL HIGH (ref 0.0–7.0)
MCH: 14.3 pg — ABNORMAL LOW (ref 26.0–34.0)
MCV: 57.9 fL — ABNORMAL LOW (ref 81.0–101.0)
MONO%: 11.9 % (ref 0.0–13.0)
RBC: 4.25 10*6/uL (ref 3.70–5.32)
RDW: 14.2 % (ref 11.3–14.5)
lymph#: 0.9 10*3/uL (ref 0.9–3.3)

## 2007-02-19 ENCOUNTER — Ambulatory Visit: Payer: Self-pay | Admitting: Hematology & Oncology

## 2007-02-22 LAB — CBC WITH DIFFERENTIAL/PLATELET
Basophils Absolute: 0.1 10*3/uL (ref 0.0–0.1)
EOS%: 3.5 % (ref 0.0–7.0)
HCT: 24.5 % — ABNORMAL LOW (ref 34.8–46.6)
HGB: 6.3 g/dL — CL (ref 11.6–15.9)
MCH: 14.6 pg — ABNORMAL LOW (ref 26.0–34.0)
MCV: 57 fL — ABNORMAL LOW (ref 81.0–101.0)
MONO%: 11.3 % (ref 0.0–13.0)
NEUT%: 68.7 % (ref 39.6–76.8)
Platelets: 284 10*3/uL (ref 145–400)

## 2007-03-08 LAB — CBC WITH DIFFERENTIAL/PLATELET
Basophils Absolute: 0 10*3/uL (ref 0.0–0.1)
EOS%: 9.9 % — ABNORMAL HIGH (ref 0.0–7.0)
HGB: 6.2 g/dL — CL (ref 11.6–15.9)
MCH: 13.7 pg — ABNORMAL LOW (ref 26.0–34.0)
MCV: 56.4 fL — ABNORMAL LOW (ref 81.0–101.0)
MONO%: 10.3 % (ref 0.0–13.0)
RBC: 4.52 10*6/uL (ref 3.70–5.32)
RDW: 13.7 % (ref 11.3–14.5)

## 2007-03-22 LAB — CBC WITH DIFFERENTIAL/PLATELET
BASO%: 1 % (ref 0.0–2.0)
EOS%: 5.7 % (ref 0.0–7.0)
Eosinophils Absolute: 0.3 10*3/uL (ref 0.0–0.5)
MCHC: 23.6 g/dL — ABNORMAL LOW (ref 32.0–36.0)
MCV: 57.4 fL — ABNORMAL LOW (ref 81.0–101.0)
MONO%: 11.4 % (ref 0.0–13.0)
NEUT#: 3.5 10*3/uL (ref 1.5–6.5)
RBC: 4.2 10*6/uL (ref 3.70–5.32)
RDW: 13.4 % (ref 11.3–14.5)

## 2007-03-22 LAB — FERRITIN: Ferritin: <1 ng/mL — ABNORMAL LOW (ref 10–291)

## 2007-03-22 LAB — IRON AND TIBC
Iron: 12 ug/dL — ABNORMAL LOW (ref 42–145)
UIBC: 464 ug/dL

## 2007-04-03 ENCOUNTER — Inpatient Hospital Stay (HOSPITAL_COMMUNITY): Admission: EM | Admit: 2007-04-03 | Discharge: 2007-04-05 | Payer: Self-pay | Admitting: Hematology & Oncology

## 2007-04-03 ENCOUNTER — Ambulatory Visit: Payer: Self-pay | Admitting: Hematology & Oncology

## 2007-04-05 LAB — CBC WITH DIFFERENTIAL/PLATELET
BASO%: 0.4 % (ref 0.0–2.0)
Basophils Absolute: 0 10*3/uL (ref 0.0–0.1)
HCT: 29 % — ABNORMAL LOW (ref 34.8–46.6)
HGB: 6.9 g/dL — CL (ref 11.6–15.9)
MCHC: 23.8 g/dL — ABNORMAL LOW (ref 32.0–36.0)
MONO#: 0.5 10*3/uL (ref 0.1–0.9)
NEUT#: 4.3 10*3/uL (ref 1.5–6.5)
NEUT%: 73.9 % (ref 39.6–76.8)
WBC: 5.8 10*3/uL (ref 3.9–10.0)
lymph#: 0.8 10*3/uL — ABNORMAL LOW (ref 0.9–3.3)

## 2007-04-19 LAB — CBC WITH DIFFERENTIAL/PLATELET
Basophils Absolute: 0.1 10*3/uL (ref 0.0–0.1)
EOS%: 4.7 % (ref 0.0–7.0)
HCT: 25 % — ABNORMAL LOW (ref 34.8–46.6)
HGB: 5.9 g/dL — CL (ref 11.6–15.9)
MCH: 13.5 pg — ABNORMAL LOW (ref 26.0–34.0)
NEUT%: 69.9 % (ref 39.6–76.8)
lymph#: 1 10*3/uL (ref 0.9–3.3)

## 2007-05-03 LAB — CBC & DIFF AND RETIC
Basophils Absolute: 0 10*3/uL (ref 0.0–0.1)
EOS%: 6.1 % (ref 0.0–7.0)
Eosinophils Absolute: 0.3 10*3/uL (ref 0.0–0.5)
HGB: 5.6 g/dL — CL (ref 11.6–15.9)
MCH: 13.3 pg — ABNORMAL LOW (ref 26.0–34.0)
NEUT#: 3.7 10*3/uL (ref 1.5–6.5)
RBC: 4.16 10*6/uL (ref 3.70–5.32)
RDW: 13.8 % (ref 11.3–14.5)
RETIC #: 62 10*3/uL (ref 19.7–115.1)
Retic %: 1.5 % (ref 0.4–2.3)
lymph#: 0.7 10*3/uL — ABNORMAL LOW (ref 0.9–3.3)

## 2007-05-03 LAB — CHCC SMEAR

## 2007-05-17 LAB — CBC WITH DIFFERENTIAL/PLATELET
Eosinophils Absolute: 0.2 10*3/uL (ref 0.0–0.5)
MONO#: 0.6 10*3/uL (ref 0.1–0.9)
NEUT#: 3.5 10*3/uL (ref 1.5–6.5)
Platelets: 264 10*3/uL (ref 145–400)
RBC: 4.37 10*6/uL (ref 3.70–5.32)
RDW: UNDETERMINED % (ref 11.3–14.5)
WBC: 5.1 10*3/uL (ref 3.9–10.0)

## 2007-05-29 ENCOUNTER — Ambulatory Visit: Payer: Self-pay | Admitting: Hematology & Oncology

## 2007-06-05 LAB — CBC WITH DIFFERENTIAL/PLATELET
Basophils Absolute: 0.1 10*3/uL (ref 0.0–0.1)
EOS%: 5 % (ref 0.0–7.0)
HGB: 6.4 g/dL — CL (ref 11.6–15.9)
MCH: 14 pg — ABNORMAL LOW (ref 26.0–34.0)
MCV: 51.5 fL — ABNORMAL LOW (ref 81.0–101.0)
MONO%: 8 % (ref 0.0–13.0)
RDW: 18.3 % — ABNORMAL HIGH (ref 11.3–14.5)

## 2007-06-14 LAB — CBC WITH DIFFERENTIAL/PLATELET
BASO%: 0.8 % (ref 0.0–2.0)
EOS%: 11.2 % — ABNORMAL HIGH (ref 0.0–7.0)
MCH: 14.3 pg — ABNORMAL LOW (ref 26.0–34.0)
MCHC: 27.6 g/dL — ABNORMAL LOW (ref 32.0–36.0)
MCV: 51.8 fL — ABNORMAL LOW (ref 81.0–101.0)
MONO%: 7.3 % (ref 0.0–13.0)
NEUT#: 4.5 10*3/uL (ref 1.5–6.5)
RBC: 4.69 10*6/uL (ref 3.70–5.32)
RDW: 18.8 % — ABNORMAL HIGH (ref 11.3–14.5)

## 2007-06-26 LAB — CBC WITH DIFFERENTIAL/PLATELET
BASO%: 0.7 % (ref 0.0–2.0)
Eosinophils Absolute: 0.6 10*3/uL — ABNORMAL HIGH (ref 0.0–0.5)
MONO#: 0.5 10*3/uL (ref 0.1–0.9)
NEUT#: 3.3 10*3/uL (ref 1.5–6.5)
RBC: 4.24 10*6/uL (ref 3.70–5.32)
RDW: 18.9 % — ABNORMAL HIGH (ref 11.3–14.5)
WBC: 5.3 10*3/uL (ref 3.9–10.0)

## 2007-06-26 LAB — FERRITIN: Ferritin: <1 ng/mL — ABNORMAL LOW (ref 10–291)

## 2007-07-07 ENCOUNTER — Ambulatory Visit: Payer: Self-pay | Admitting: Hematology & Oncology

## 2007-07-12 LAB — CBC WITH DIFFERENTIAL/PLATELET
EOS%: 7.6 % — ABNORMAL HIGH (ref 0.0–7.0)
Eosinophils Absolute: 0.4 10*3/uL (ref 0.0–0.5)
LYMPH%: 19.8 % (ref 14.0–48.0)
MCH: 13.7 pg — ABNORMAL LOW (ref 26.0–34.0)
MCV: 51.4 fL — ABNORMAL LOW (ref 81.0–101.0)
MONO%: 9.5 % (ref 0.0–13.0)
NEUT#: 3.3 10*3/uL (ref 1.5–6.5)
Platelets: 255 10*3/uL (ref 145–400)
RBC: 4.35 10*6/uL (ref 3.70–5.32)

## 2007-07-25 LAB — CBC WITH DIFFERENTIAL/PLATELET
BASO%: 0.5 % (ref 0.0–2.0)
LYMPH%: 18.7 % (ref 14.0–48.0)
MCHC: 24.4 g/dL — ABNORMAL LOW (ref 32.0–36.0)
MCV: 52.3 fL — ABNORMAL LOW (ref 81.0–101.0)
MONO#: 0.7 10*3/uL (ref 0.1–0.9)
MONO%: 11.5 % (ref 0.0–13.0)
Platelets: 289 10*3/uL (ref 145–400)
RBC: 5.04 10*6/uL (ref 3.70–5.32)
WBC: 6 10*3/uL (ref 3.9–10.0)

## 2007-07-26 LAB — C-REACTIVE PROTEIN: CRP: 0.8 mg/dL — ABNORMAL HIGH (ref <0.6)

## 2007-08-11 LAB — CBC WITH DIFFERENTIAL/PLATELET
Basophils Absolute: 0 10*3/uL (ref 0.0–0.1)
EOS%: 4.9 % (ref 0.0–7.0)
LYMPH%: 15.5 % (ref 14.0–48.0)
MCH: 13.9 pg — ABNORMAL LOW (ref 26.0–34.0)
MCV: 52.2 fL — ABNORMAL LOW (ref 81.0–101.0)
MONO%: 9.9 % (ref 0.0–13.0)
Platelets: 265 10*3/uL (ref 145–400)
RBC: 4.33 10*6/uL (ref 3.70–5.32)
RDW: 18.9 % — ABNORMAL HIGH (ref 11.3–14.5)

## 2007-08-17 ENCOUNTER — Ambulatory Visit: Payer: Self-pay | Admitting: Hematology & Oncology

## 2007-09-06 LAB — CBC WITH DIFFERENTIAL/PLATELET
Basophils Absolute: 0 10*3/uL (ref 0.0–0.1)
EOS%: 11.4 % — ABNORMAL HIGH (ref 0.0–7.0)
Eosinophils Absolute: 0.7 10*3/uL — ABNORMAL HIGH (ref 0.0–0.5)
HCT: 25.5 % — ABNORMAL LOW (ref 34.8–46.6)
HGB: 6.7 g/dL — CL (ref 11.6–15.9)
MCH: 13.7 pg — ABNORMAL LOW (ref 26.0–34.0)
MONO#: 0.5 10*3/uL (ref 0.1–0.9)
NEUT%: 65.7 % (ref 39.6–76.8)
lymph#: 0.7 10*3/uL — ABNORMAL LOW (ref 0.9–3.3)

## 2007-09-20 LAB — CBC WITH DIFFERENTIAL/PLATELET
Basophils Absolute: 0 10*3/uL (ref 0.0–0.1)
EOS%: 4.5 % (ref 0.0–7.0)
HCT: 23.7 % — ABNORMAL LOW (ref 34.8–46.6)
HGB: 6.4 g/dL — CL (ref 11.6–15.9)
LYMPH%: 21.2 % (ref 14.0–48.0)
MCH: 14.4 pg — ABNORMAL LOW (ref 26.0–34.0)
MCV: 53.4 fL — ABNORMAL LOW (ref 81.0–101.0)
MONO%: 10 % (ref 0.0–13.0)
NEUT%: 63.3 % (ref 39.6–76.8)
Platelets: 249 10*3/uL (ref 145–400)

## 2007-09-28 ENCOUNTER — Ambulatory Visit: Payer: Self-pay | Admitting: Hematology & Oncology

## 2007-09-28 ENCOUNTER — Inpatient Hospital Stay (HOSPITAL_COMMUNITY): Admission: AD | Admit: 2007-09-28 | Discharge: 2007-09-30 | Payer: Self-pay | Admitting: Hematology & Oncology

## 2007-09-28 ENCOUNTER — Encounter: Payer: Self-pay | Admitting: Hematology & Oncology

## 2007-10-02 ENCOUNTER — Ambulatory Visit: Payer: Self-pay | Admitting: Hematology & Oncology

## 2007-10-04 ENCOUNTER — Ambulatory Visit: Payer: Self-pay | Admitting: Hematology & Oncology

## 2007-10-19 LAB — CBC WITH DIFFERENTIAL (CANCER CENTER ONLY)
HCT: 24.1 % — ABNORMAL LOW (ref 34.8–46.6)
MCH: 14.9 pg — ABNORMAL LOW (ref 26.0–34.0)
MCV: 55 fL — ABNORMAL LOW (ref 81–101)
Platelets: 372 10*3/uL (ref 145–400)
RBC: 4.36 10*6/uL (ref 3.70–5.32)

## 2007-10-19 LAB — MANUAL DIFFERENTIAL (CHCC SATELLITE)
ANC (CHCC HP manual diff): 3.3 10*3/uL (ref 1.5–6.7)
LYMPH: 11 % — ABNORMAL LOW (ref 14–48)
MONO: 7 % (ref 0–13)
PLT EST ~~LOC~~: ADEQUATE

## 2007-11-01 LAB — CBC WITH DIFFERENTIAL (CANCER CENTER ONLY)
BASO#: 0 10*3/uL (ref 0.0–0.2)
Eosinophils Absolute: 0.5 10*3/uL (ref 0.0–0.5)
HGB: 6.8 g/dL — CL (ref 11.6–15.9)
LYMPH#: 1 10*3/uL (ref 0.9–3.3)
NEUT#: 3.4 10*3/uL (ref 1.5–6.5)
Platelets: 277 10*3/uL (ref 145–400)
RBC: 4.58 10*6/uL (ref 3.70–5.32)
WBC: 5.4 10*3/uL (ref 3.9–10.0)

## 2007-11-15 LAB — CBC WITH DIFFERENTIAL (CANCER CENTER ONLY)
BASO#: 0 10*3/uL (ref 0.0–0.2)
BASO%: 0.3 % (ref 0.0–2.0)
EOS%: 7.4 % — ABNORMAL HIGH (ref 0.0–7.0)
HCT: 23.1 % — ABNORMAL LOW (ref 34.8–46.6)
HGB: 6.2 g/dL — CL (ref 11.6–15.9)
LYMPH#: 0.8 10*3/uL — ABNORMAL LOW (ref 0.9–3.3)
LYMPH%: 16.3 % (ref 14.0–48.0)
MCH: 14.4 pg — ABNORMAL LOW (ref 26.0–34.0)
MCHC: 26.9 g/dL — ABNORMAL LOW (ref 32.0–36.0)
MONO%: 8.6 % (ref 0.0–13.0)
NEUT%: 67.4 % (ref 39.6–80.0)
RDW: 19.1 % — ABNORMAL HIGH (ref 10.5–14.6)

## 2007-11-28 ENCOUNTER — Ambulatory Visit: Payer: Self-pay | Admitting: Hematology & Oncology

## 2007-11-29 LAB — CBC WITH DIFFERENTIAL (CANCER CENTER ONLY)
BASO#: 0 10*3/uL (ref 0.0–0.2)
Eosinophils Absolute: 0.3 10*3/uL (ref 0.0–0.5)
HGB: 6.3 g/dL — CL (ref 11.6–15.9)
LYMPH#: 0.9 10*3/uL (ref 0.9–3.3)
NEUT#: 2.5 10*3/uL (ref 1.5–6.5)
RBC: 4.41 10*6/uL (ref 3.70–5.32)
WBC: 4.1 10*3/uL (ref 3.9–10.0)

## 2007-12-13 LAB — CBC WITH DIFFERENTIAL (CANCER CENTER ONLY)
BASO#: 0 10*3/uL (ref 0.0–0.2)
Eosinophils Absolute: 0.3 10*3/uL (ref 0.0–0.5)
HGB: 6.5 g/dL — CL (ref 11.6–15.9)
LYMPH#: 0.9 10*3/uL (ref 0.9–3.3)
MONO#: 0.3 10*3/uL (ref 0.1–0.9)
NEUT#: 3.6 10*3/uL (ref 1.5–6.5)
RBC: 4.61 10*6/uL (ref 3.70–5.32)

## 2007-12-20 LAB — CBC WITH DIFFERENTIAL (CANCER CENTER ONLY)
BASO#: 0 10*3/uL (ref 0.0–0.2)
BASO%: 0.3 % (ref 0.0–2.0)
EOS%: 5.4 % (ref 0.0–7.0)
HGB: 7 g/dL — CL (ref 11.6–15.9)
LYMPH#: 0.7 10*3/uL — ABNORMAL LOW (ref 0.9–3.3)
MCHC: 27.3 g/dL — ABNORMAL LOW (ref 32.0–36.0)
NEUT#: 2.4 10*3/uL (ref 1.5–6.5)
RDW: 20.5 % — ABNORMAL HIGH (ref 10.5–14.6)

## 2007-12-27 LAB — CBC WITH DIFFERENTIAL (CANCER CENTER ONLY)
BASO%: 0.4 % (ref 0.0–2.0)
EOS%: 5.9 % (ref 0.0–7.0)
HGB: 6.1 g/dL — CL (ref 11.6–15.9)
LYMPH#: 0.9 10*3/uL (ref 0.9–3.3)
MCH: 14.5 pg — ABNORMAL LOW (ref 26.0–34.0)
MCV: 54 fL — ABNORMAL LOW (ref 81–101)
NEUT%: 64.4 % (ref 39.6–80.0)
Platelets: 189 10*3/uL (ref 145–400)
RBC: 4.22 10*6/uL (ref 3.70–5.32)
WBC: 4.6 10*3/uL (ref 3.9–10.0)

## 2008-01-10 LAB — CBC WITH DIFFERENTIAL (CANCER CENTER ONLY)
BASO%: 0.3 % (ref 0.0–2.0)
EOS%: 3.8 % (ref 0.0–7.0)
HGB: 6.5 g/dL — CL (ref 11.6–15.9)
LYMPH#: 1 10*3/uL (ref 0.9–3.3)
MCHC: 27.1 g/dL — ABNORMAL LOW (ref 32.0–36.0)
NEUT#: 3.9 10*3/uL (ref 1.5–6.5)
Platelets: 247 10*3/uL (ref 145–400)
RDW: 20.1 % — ABNORMAL HIGH (ref 10.5–14.6)

## 2008-01-10 LAB — TECHNOLOGIST REVIEW CHCC SATELLITE

## 2008-01-23 ENCOUNTER — Ambulatory Visit: Payer: Self-pay | Admitting: Hematology & Oncology

## 2008-01-24 LAB — CBC WITH DIFFERENTIAL (CANCER CENTER ONLY)
BASO#: 0 10*3/uL (ref 0.0–0.2)
Eosinophils Absolute: 0.3 10*3/uL (ref 0.0–0.5)
HGB: 6.9 g/dL — CL (ref 11.6–15.9)
LYMPH%: 21.7 % (ref 14.0–48.0)
MCH: 14.9 pg — ABNORMAL LOW (ref 26.0–34.0)
MCV: 55 fL — ABNORMAL LOW (ref 81–101)
MONO#: 0.5 10*3/uL (ref 0.1–0.9)
MONO%: 8.2 % (ref 0.0–13.0)
NEUT#: 3.6 10*3/uL (ref 1.5–6.5)
RBC: 4.61 10*6/uL (ref 3.70–5.32)
WBC: 5.6 10*3/uL (ref 3.9–10.0)

## 2008-01-24 LAB — TECHNOLOGIST REVIEW CHCC SATELLITE

## 2008-02-07 LAB — CBC WITH DIFFERENTIAL (CANCER CENTER ONLY)
BASO%: 0.4 % (ref 0.0–2.0)
EOS%: 2.4 % (ref 0.0–7.0)
HCT: 22.6 % — ABNORMAL LOW (ref 34.8–46.6)
LYMPH%: 14.9 % (ref 14.0–48.0)
MCHC: 27.3 g/dL — ABNORMAL LOW (ref 32.0–36.0)
MCV: 56 fL — ABNORMAL LOW (ref 81–101)
MONO#: 0.4 10*3/uL (ref 0.1–0.9)
MONO%: 7.8 % (ref 0.0–13.0)
NEUT%: 74.5 % (ref 39.6–80.0)
Platelets: 261 10*3/uL (ref 145–400)
RDW: 20.2 % — ABNORMAL HIGH (ref 10.5–14.6)
WBC: 5 10*3/uL (ref 3.9–10.0)

## 2008-02-21 LAB — CBC WITH DIFFERENTIAL (CANCER CENTER ONLY)
BASO%: 0.3 % (ref 0.0–2.0)
EOS%: 4 % (ref 0.0–7.0)
HCT: 24.5 % — ABNORMAL LOW (ref 34.8–46.6)
LYMPH#: 1.1 10*3/uL (ref 0.9–3.3)
MCHC: 27.1 g/dL — ABNORMAL LOW (ref 32.0–36.0)
MONO#: 0.5 10*3/uL (ref 0.1–0.9)
NEUT#: 5.4 10*3/uL (ref 1.5–6.5)
NEUT%: 74.1 % (ref 39.6–80.0)
Platelets: 280 10*3/uL (ref 145–400)
RDW: 19.1 % — ABNORMAL HIGH (ref 10.5–14.6)
WBC: 7.3 10*3/uL (ref 3.9–10.0)

## 2008-02-28 LAB — FERRITIN: Ferritin: 1 ng/mL — ABNORMAL LOW (ref 10–291)

## 2008-02-28 LAB — CBC WITH DIFFERENTIAL (CANCER CENTER ONLY)
BASO#: 0 10*3/uL (ref 0.0–0.2)
BASO%: 0.2 % (ref 0.0–2.0)
EOS%: 5 % (ref 0.0–7.0)
HGB: 6.3 g/dL — CL (ref 11.6–15.9)
LYMPH#: 0.9 10*3/uL (ref 0.9–3.3)
MCHC: 27.8 g/dL — ABNORMAL LOW (ref 32.0–36.0)
NEUT#: 3.4 10*3/uL (ref 1.5–6.5)
Platelets: 284 10*3/uL (ref 145–400)

## 2008-02-28 LAB — TECHNOLOGIST REVIEW CHCC SATELLITE

## 2008-03-13 ENCOUNTER — Ambulatory Visit: Payer: Self-pay | Admitting: Hematology & Oncology

## 2008-03-13 LAB — TECHNOLOGIST REVIEW CHCC SATELLITE

## 2008-03-13 LAB — CBC WITH DIFFERENTIAL (CANCER CENTER ONLY)
BASO#: 0 10*3/uL (ref 0.0–0.2)
Eosinophils Absolute: 0.2 10*3/uL (ref 0.0–0.5)
HCT: 20.7 % — ABNORMAL LOW (ref 34.8–46.6)
HGB: 5.5 g/dL — CL (ref 11.6–15.9)
LYMPH%: 18.5 % (ref 14.0–48.0)
MCH: 14.7 pg — ABNORMAL LOW (ref 26.0–34.0)
MCV: 55 fL — ABNORMAL LOW (ref 81–101)
MONO#: 0.5 10*3/uL (ref 0.1–0.9)
MONO%: 10 % (ref 0.0–13.0)
RBC: 3.73 10*6/uL (ref 3.70–5.32)
WBC: 4.7 10*3/uL (ref 3.9–10.0)

## 2008-03-27 LAB — TECHNOLOGIST REVIEW CHCC SATELLITE

## 2008-03-27 LAB — CBC WITH DIFFERENTIAL (CANCER CENTER ONLY)
Eosinophils Absolute: 0.4 10*3/uL (ref 0.0–0.5)
HCT: 23.1 % — ABNORMAL LOW (ref 34.8–46.6)
LYMPH%: 15.4 % (ref 14.0–48.0)
MCH: 14.4 pg — ABNORMAL LOW (ref 26.0–34.0)
MCV: 54 fL — ABNORMAL LOW (ref 81–101)
MONO#: 0.4 10*3/uL (ref 0.1–0.9)
MONO%: 7 % (ref 0.0–13.0)
NEUT%: 70.9 % (ref 39.6–80.0)
Platelets: 265 10*3/uL (ref 145–400)
RBC: 4.29 10*6/uL (ref 3.70–5.32)
RDW: 18.5 % — ABNORMAL HIGH (ref 10.5–14.6)

## 2008-04-01 ENCOUNTER — Ambulatory Visit: Payer: Self-pay | Admitting: Hematology & Oncology

## 2008-04-01 ENCOUNTER — Observation Stay (HOSPITAL_COMMUNITY): Admission: AD | Admit: 2008-04-01 | Discharge: 2008-04-03 | Payer: Self-pay | Admitting: Hematology & Oncology

## 2008-04-02 ENCOUNTER — Encounter: Payer: Self-pay | Admitting: Hematology & Oncology

## 2008-04-10 LAB — CBC WITH DIFFERENTIAL (CANCER CENTER ONLY)
BASO#: 0 10*3/uL (ref 0.0–0.2)
BASO%: 0.4 % (ref 0.0–2.0)
EOS%: 5.5 % (ref 0.0–7.0)
HGB: 6.6 g/dL — CL (ref 11.6–15.9)
LYMPH#: 1.1 10*3/uL (ref 0.9–3.3)
MCHC: 26.8 g/dL — ABNORMAL LOW (ref 32.0–36.0)
NEUT#: 4.8 10*3/uL (ref 1.5–6.5)

## 2008-04-24 LAB — CBC WITH DIFFERENTIAL (CANCER CENTER ONLY)
BASO#: 0 10*3/uL (ref 0.0–0.2)
Eosinophils Absolute: 0.4 10*3/uL (ref 0.0–0.5)
HGB: 6 g/dL — CL (ref 11.6–15.9)
LYMPH#: 1 10*3/uL (ref 0.9–3.3)
MCH: 14.5 pg — ABNORMAL LOW (ref 26.0–34.0)
MONO#: 0.4 10*3/uL (ref 0.1–0.9)
NEUT#: 3.2 10*3/uL (ref 1.5–6.5)
RBC: 4.14 10*6/uL (ref 3.70–5.32)
WBC: 5.1 10*3/uL (ref 3.9–10.0)

## 2008-04-24 LAB — TECHNOLOGIST REVIEW CHCC SATELLITE

## 2008-05-07 ENCOUNTER — Ambulatory Visit: Payer: Self-pay | Admitting: Hematology & Oncology

## 2008-05-15 LAB — CBC WITH DIFFERENTIAL (CANCER CENTER ONLY)
BASO%: 0.2 % (ref 0.0–2.0)
EOS%: 3.8 % (ref 0.0–7.0)
HCT: 22.4 % — ABNORMAL LOW (ref 34.8–46.6)
LYMPH%: 25.2 % (ref 14.0–48.0)
MCH: 14.5 pg — ABNORMAL LOW (ref 26.0–34.0)
MCHC: 26.3 g/dL — ABNORMAL LOW (ref 32.0–36.0)
MCV: 55 fL — ABNORMAL LOW (ref 81–101)
MONO%: 6.8 % (ref 0.0–13.0)
NEUT%: 64 % (ref 39.6–80.0)
RDW: 20.3 % — ABNORMAL HIGH (ref 10.5–14.6)

## 2008-05-15 LAB — COMPREHENSIVE METABOLIC PANEL
ALT: <8 U/L (ref 0–35)
BUN: 4 mg/dL — ABNORMAL LOW (ref 6–23)
CO2: 30 mEq/L (ref 19–32)
Calcium: 8.6 mg/dL (ref 8.4–10.5)
Chloride: 100 mEq/L (ref 96–112)
Creatinine, Ser: 0.63 mg/dL (ref 0.40–1.20)

## 2008-05-15 LAB — FERRITIN: Ferritin: 1 ng/mL — ABNORMAL LOW (ref 10–291)

## 2008-05-29 LAB — CBC WITH DIFFERENTIAL (CANCER CENTER ONLY)
BASO#: 0 10*3/uL (ref 0.0–0.2)
Eosinophils Absolute: 0.5 10*3/uL (ref 0.0–0.5)
HGB: 7 g/dL — CL (ref 11.6–15.9)
LYMPH#: 1.1 10*3/uL (ref 0.9–3.3)
LYMPH%: 16.2 % (ref 14.0–48.0)
MCH: 14.6 pg — ABNORMAL LOW (ref 26.0–34.0)
MCV: 55 fL — ABNORMAL LOW (ref 81–101)
MONO#: 0.7 10*3/uL (ref 0.1–0.9)
Platelets: 322 10*3/uL (ref 145–400)
RBC: 4.79 10*6/uL (ref 3.70–5.32)
WBC: 7 10*3/uL (ref 3.9–10.0)

## 2008-06-12 LAB — CBC WITH DIFFERENTIAL (CANCER CENTER ONLY)
Eosinophils Absolute: 0.3 10*3/uL (ref 0.0–0.5)
HCT: 21.7 % — ABNORMAL LOW (ref 34.8–46.6)
LYMPH%: 16.2 % (ref 14.0–48.0)
MCH: 14.5 pg — ABNORMAL LOW (ref 26.0–34.0)
MCV: 55 fL — ABNORMAL LOW (ref 81–101)
MONO#: 0.3 10*3/uL (ref 0.1–0.9)
MONO%: 6.2 % (ref 0.0–13.0)
NEUT%: 71 % (ref 39.6–80.0)
RBC: 3.94 10*6/uL (ref 3.70–5.32)
RDW: 18.9 % — ABNORMAL HIGH (ref 10.5–14.6)
WBC: 5 10*3/uL (ref 3.9–10.0)

## 2008-06-12 LAB — TECHNOLOGIST REVIEW CHCC SATELLITE

## 2008-06-24 ENCOUNTER — Ambulatory Visit: Payer: Self-pay | Admitting: Hematology & Oncology

## 2008-06-26 LAB — CBC WITH DIFFERENTIAL (CANCER CENTER ONLY)
BASO%: 0.3 % (ref 0.0–2.0)
EOS%: 7.3 % — ABNORMAL HIGH (ref 0.0–7.0)
HCT: 22.8 % — ABNORMAL LOW (ref 34.8–46.6)
LYMPH#: 0.8 10*3/uL — ABNORMAL LOW (ref 0.9–3.3)
MCHC: 25.9 g/dL — ABNORMAL LOW (ref 32.0–36.0)
NEUT#: 3.5 10*3/uL (ref 1.5–6.5)
NEUT%: 68.2 % (ref 39.6–80.0)
Platelets: 242 10*3/uL (ref 145–400)
RDW: 19.9 % — ABNORMAL HIGH (ref 10.5–14.6)

## 2008-07-10 LAB — CBC WITH DIFFERENTIAL (CANCER CENTER ONLY)
BASO#: 0 10*3/uL (ref 0.0–0.2)
Eosinophils Absolute: 0.3 10*3/uL (ref 0.0–0.5)
HCT: 24.5 % — ABNORMAL LOW (ref 34.8–46.6)
HGB: 6.3 g/dL — CL (ref 11.6–15.9)
LYMPH%: 20 % (ref 14.0–48.0)
MCH: 14.2 pg — ABNORMAL LOW (ref 26.0–34.0)
MCV: 55 fL — ABNORMAL LOW (ref 81–101)
MONO%: 9.6 % (ref 0.0–13.0)
RBC: 4.47 10*6/uL (ref 3.70–5.32)

## 2008-07-10 LAB — TECHNOLOGIST REVIEW CHCC SATELLITE

## 2008-07-24 LAB — CBC WITH DIFFERENTIAL (CANCER CENTER ONLY)
BASO#: 0 10*3/uL (ref 0.0–0.2)
Eosinophils Absolute: 0.3 10*3/uL (ref 0.0–0.5)
HGB: 6.5 g/dL — CL (ref 11.6–15.9)
LYMPH#: 0.8 10*3/uL — ABNORMAL LOW (ref 0.9–3.3)
NEUT#: 3.9 10*3/uL (ref 1.5–6.5)
RBC: 4.47 10*6/uL (ref 3.70–5.32)

## 2008-07-31 ENCOUNTER — Ambulatory Visit: Payer: Self-pay | Admitting: Hematology & Oncology

## 2008-07-31 LAB — CBC WITH DIFFERENTIAL (CANCER CENTER ONLY)
HGB: 5.7 g/dL — CL (ref 11.6–15.9)
MCHC: 26.7 g/dL — ABNORMAL LOW (ref 32.0–36.0)
RDW: 19.9 % — ABNORMAL HIGH (ref 10.5–14.6)

## 2008-07-31 LAB — MANUAL DIFFERENTIAL (CHCC SATELLITE)
LYMPH: 10 % — ABNORMAL LOW (ref 14–48)
SEG: 80 % — ABNORMAL HIGH (ref 40–75)

## 2008-08-07 LAB — CBC WITH DIFFERENTIAL (CANCER CENTER ONLY)
BASO#: 0 10*3/uL (ref 0.0–0.2)
BASO%: 0.2 % (ref 0.0–2.0)
EOS%: 4.8 % (ref 0.0–7.0)
HGB: 5.6 g/dL — CL (ref 11.6–15.9)
LYMPH#: 1.1 10*3/uL (ref 0.9–3.3)
MCHC: 26.8 g/dL — ABNORMAL LOW (ref 32.0–36.0)
MONO#: 0.4 10*3/uL (ref 0.1–0.9)
NEUT#: 2.7 10*3/uL (ref 1.5–6.5)
RDW: 18.9 % — ABNORMAL HIGH (ref 10.5–14.6)
WBC: 4.4 10*3/uL (ref 3.9–10.0)

## 2008-08-07 LAB — TECHNOLOGIST REVIEW CHCC SATELLITE

## 2008-08-21 LAB — CBC WITH DIFFERENTIAL (CANCER CENTER ONLY)
BASO#: 0 10*3/uL (ref 0.0–0.2)
BASO%: 0.1 % (ref 0.0–2.0)
EOS%: 5.2 % (ref 0.0–7.0)
Eosinophils Absolute: 0.3 10*3/uL (ref 0.0–0.5)
HCT: 22.4 % — ABNORMAL LOW (ref 34.8–46.6)
HGB: 6 g/dL — CL (ref 11.6–15.9)
LYMPH#: 1.2 10*3/uL (ref 0.9–3.3)
LYMPH%: 21.5 % (ref 14.0–48.0)
MCH: 14.5 pg — ABNORMAL LOW (ref 26.0–34.0)
MCHC: 27 g/dL — ABNORMAL LOW (ref 32.0–36.0)
MCV: 54 fL — ABNORMAL LOW (ref 81–101)
MONO#: 0.5 10*3/uL (ref 0.1–0.9)
MONO%: 9.5 % (ref 0.0–13.0)
NEUT#: 3.4 10*3/uL (ref 1.5–6.5)
NEUT%: 63.7 % (ref 39.6–80.0)
Platelets: 293 10*3/uL (ref 145–400)
RBC: 4.17 10*6/uL (ref 3.70–5.32)
RDW: 19.3 % — ABNORMAL HIGH (ref 10.5–14.6)
WBC: 5.4 10*3/uL (ref 3.9–10.0)

## 2008-08-21 LAB — TECHNOLOGIST REVIEW CHCC SATELLITE

## 2008-09-04 ENCOUNTER — Ambulatory Visit: Payer: Self-pay | Admitting: Hematology & Oncology

## 2008-09-04 LAB — CBC WITH DIFFERENTIAL (CANCER CENTER ONLY)
BASO%: 0.2 % (ref 0.0–2.0)
EOS%: 6.8 % (ref 0.0–7.0)
HCT: 22.4 % — ABNORMAL LOW (ref 34.8–46.6)
LYMPH#: 0.8 10*3/uL — ABNORMAL LOW (ref 0.9–3.3)
MCHC: 26.3 g/dL — ABNORMAL LOW (ref 32.0–36.0)
NEUT%: 64.7 % (ref 39.6–80.0)
Platelets: 275 10*3/uL (ref 145–400)
RDW: 19.3 % — ABNORMAL HIGH (ref 10.5–14.6)

## 2008-09-18 LAB — MANUAL DIFFERENTIAL (CHCC SATELLITE)
Eos: 3 % (ref 0–7)
SEG: 78 % — ABNORMAL HIGH (ref 40–75)

## 2008-09-18 LAB — CBC WITH DIFFERENTIAL (CANCER CENTER ONLY)
MCH: 14.1 pg — ABNORMAL LOW (ref 26.0–34.0)
MCHC: 26.8 g/dL — ABNORMAL LOW (ref 32.0–36.0)
MCV: 53 fL — ABNORMAL LOW (ref 81–101)
Platelets: 340 10*3/uL (ref 145–400)
RBC: 4.86 10*6/uL (ref 3.70–5.32)
RDW: 18.6 % — ABNORMAL HIGH (ref 10.5–14.6)

## 2008-09-25 LAB — MANUAL DIFFERENTIAL (CHCC SATELLITE)
ALC: 0.6 10*3/uL (ref 0.6–2.2)
Eos: 4 % (ref 0–7)
MONO: 8 % (ref 0–13)

## 2008-09-25 LAB — CBC WITH DIFFERENTIAL (CANCER CENTER ONLY)
HCT: 25.5 % — ABNORMAL LOW (ref 34.8–46.6)
HGB: 6.7 g/dL — CL (ref 11.6–15.9)
MCH: 13.9 pg — ABNORMAL LOW (ref 26.0–34.0)
MCHC: 26.2 g/dL — ABNORMAL LOW (ref 32.0–36.0)
MCV: 53 fL — ABNORMAL LOW (ref 81–101)
RDW: 19.8 % — ABNORMAL HIGH (ref 10.5–14.6)

## 2008-10-02 LAB — CBC WITH DIFFERENTIAL (CANCER CENTER ONLY)
BASO#: 0 10*3/uL (ref 0.0–0.2)
Eosinophils Absolute: 0.3 10*3/uL (ref 0.0–0.5)
HCT: 18.8 % — ABNORMAL LOW (ref 34.8–46.6)
LYMPH%: 24 % (ref 14.0–48.0)
MCH: 13.8 pg — ABNORMAL LOW (ref 26.0–34.0)
MCV: 53 fL — ABNORMAL LOW (ref 81–101)
MONO#: 0.4 10*3/uL (ref 0.1–0.9)
MONO%: 10.2 % (ref 0.0–13.0)
NEUT%: 58.4 % (ref 39.6–80.0)
RBC: 3.56 10*6/uL — ABNORMAL LOW (ref 3.70–5.32)
WBC: 3.9 10*3/uL (ref 3.9–10.0)

## 2008-10-15 ENCOUNTER — Ambulatory Visit: Payer: Self-pay | Admitting: Hematology & Oncology

## 2008-10-16 LAB — CBC WITH DIFFERENTIAL (CANCER CENTER ONLY)
BASO%: 0.2 % (ref 0.0–2.0)
EOS%: 3.9 % (ref 0.0–7.0)
HCT: 21 % — ABNORMAL LOW (ref 34.8–46.6)
LYMPH%: 17.9 % (ref 14.0–48.0)
MCH: 14.1 pg — ABNORMAL LOW (ref 26.0–34.0)
MCHC: 26.7 g/dL — ABNORMAL LOW (ref 32.0–36.0)
MCV: 53 fL — ABNORMAL LOW (ref 81–101)
MONO#: 0.5 10*3/uL (ref 0.1–0.9)
MONO%: 7.8 % (ref 0.0–13.0)
NEUT%: 70.2 % (ref 39.6–80.0)
RDW: 19.6 % — ABNORMAL HIGH (ref 10.5–14.6)

## 2008-10-30 LAB — COMPREHENSIVE METABOLIC PANEL
Albumin: 4 g/dL (ref 3.5–5.2)
Alkaline Phosphatase: 54 U/L (ref 39–117)
CO2: 26 mEq/L (ref 19–32)
Calcium: 8.4 mg/dL (ref 8.4–10.5)
Chloride: 103 mEq/L (ref 96–112)
Glucose, Bld: 122 mg/dL — ABNORMAL HIGH (ref 70–99)
Potassium: 3.7 mEq/L (ref 3.5–5.3)
Sodium: 138 mEq/L (ref 135–145)
Total Protein: 6.3 g/dL (ref 6.0–8.3)

## 2008-10-30 LAB — CBC WITH DIFFERENTIAL (CANCER CENTER ONLY)
BASO%: 0.2 % (ref 0.0–2.0)
EOS%: 5.9 % (ref 0.0–7.0)
HGB: 5.5 g/dL — CL (ref 11.6–15.9)
LYMPH#: 0.8 10*3/uL — ABNORMAL LOW (ref 0.9–3.3)
MCHC: 26.3 g/dL — ABNORMAL LOW (ref 32.0–36.0)
NEUT#: 2.1 10*3/uL (ref 1.5–6.5)
Platelets: 255 10*3/uL (ref 145–400)
RDW: 19.5 % — ABNORMAL HIGH (ref 10.5–14.6)

## 2008-10-30 LAB — TECHNOLOGIST REVIEW CHCC SATELLITE

## 2008-10-30 LAB — FERRITIN: Ferritin: <1 ng/mL — ABNORMAL LOW (ref 10–291)

## 2008-10-30 LAB — RETICULOCYTES (CHCC): RBC.: 3.97 MIL/uL (ref 3.87–5.11)

## 2008-11-13 LAB — CBC WITH DIFFERENTIAL (CANCER CENTER ONLY)
BASO#: 0 10*3/uL (ref 0.0–0.2)
Eosinophils Absolute: 0.2 10*3/uL (ref 0.0–0.5)
HGB: 6.1 g/dL — CL (ref 11.6–15.9)
MCH: 14.3 pg — ABNORMAL LOW (ref 26.0–34.0)
MCV: 53 fL — ABNORMAL LOW (ref 81–101)
MONO#: 0.3 10*3/uL (ref 0.1–0.9)
NEUT#: 2.6 10*3/uL (ref 1.5–6.5)
Platelets: 283 10*3/uL (ref 145–400)
RBC: 4.24 10*6/uL (ref 3.70–5.32)
WBC: 4.1 10*3/uL (ref 3.9–10.0)

## 2008-11-13 LAB — TECHNOLOGIST REVIEW CHCC SATELLITE

## 2008-11-26 ENCOUNTER — Ambulatory Visit: Payer: Self-pay | Admitting: Hematology & Oncology

## 2008-11-27 LAB — CBC WITH DIFFERENTIAL (CANCER CENTER ONLY)
BASO#: 0 10*3/uL (ref 0.0–0.2)
BASO%: 0.2 % (ref 0.0–2.0)
HCT: 25.2 % — ABNORMAL LOW (ref 34.8–46.6)
HGB: 6.7 g/dL — CL (ref 11.6–15.9)
LYMPH#: 0.8 10*3/uL — ABNORMAL LOW (ref 0.9–3.3)
MONO#: 0.4 10*3/uL (ref 0.1–0.9)
NEUT%: 63.8 % (ref 39.6–80.0)
RBC: 4.76 10*6/uL (ref 3.70–5.32)
WBC: 4 10*3/uL (ref 3.9–10.0)

## 2008-11-27 LAB — TECHNOLOGIST REVIEW CHCC SATELLITE

## 2008-12-11 LAB — MANUAL DIFFERENTIAL (CHCC SATELLITE)
ALC: 0.5 10*3/uL — ABNORMAL LOW (ref 0.6–2.2)
ANC (CHCC HP manual diff): 2.8 10*3/uL (ref 1.5–6.7)
Eos: 1 % (ref 0–7)
LYMPH: 13 % — ABNORMAL LOW (ref 14–48)
MONO: 16 % — ABNORMAL HIGH (ref 0–13)
SEG: 70 % (ref 40–75)

## 2008-12-11 LAB — CBC WITH DIFFERENTIAL (CANCER CENTER ONLY)
HCT: 22.9 % — ABNORMAL LOW (ref 34.8–46.6)
HGB: 6.1 g/dL — CL (ref 11.6–15.9)
MCH: 14.1 pg — ABNORMAL LOW (ref 26.0–34.0)
MCHC: 26.8 g/dL — ABNORMAL LOW (ref 32.0–36.0)

## 2008-12-18 LAB — CBC WITH DIFFERENTIAL (CANCER CENTER ONLY)
HCT: 23.2 % — ABNORMAL LOW (ref 34.8–46.6)
HGB: 6.2 g/dL — CL (ref 11.6–15.9)
MCH: 14.2 pg — ABNORMAL LOW (ref 26.0–34.0)
MCHC: 26.6 g/dL — ABNORMAL LOW (ref 32.0–36.0)
MCV: 53 fL — ABNORMAL LOW (ref 81–101)
Platelets: 325 10*3/uL (ref 145–400)
RBC: 4.37 10*6/uL (ref 3.70–5.32)
RDW: 19.2 % — ABNORMAL HIGH (ref 10.5–14.6)
WBC: 5.9 10*3/uL (ref 3.9–10.0)

## 2008-12-18 LAB — MANUAL DIFFERENTIAL (CHCC SATELLITE)
ALC: 1.2 10*3/uL (ref 0.6–2.2)
ANC (CHCC HP manual diff): 4.3 10*3/uL (ref 1.5–6.7)
MONO: 2 % (ref 0–13)
SEG: 72 % (ref 40–75)

## 2008-12-25 LAB — CBC WITH DIFFERENTIAL (CANCER CENTER ONLY)
BASO%: 0.2 % (ref 0.0–2.0)
EOS%: 3.8 % (ref 0.0–7.0)
HCT: 23.5 % — ABNORMAL LOW (ref 34.8–46.6)
LYMPH#: 0.8 10*3/uL — ABNORMAL LOW (ref 0.9–3.3)
LYMPH%: 18.3 % (ref 14.0–48.0)
MCH: 14.4 pg — ABNORMAL LOW (ref 26.0–34.0)
MCHC: 27.1 g/dL — ABNORMAL LOW (ref 32.0–36.0)
MONO%: 9.3 % (ref 0.0–13.0)
NEUT%: 68.4 % (ref 39.6–80.0)
RDW: 20.1 % — ABNORMAL HIGH (ref 10.5–14.6)

## 2008-12-25 LAB — TECHNOLOGIST REVIEW CHCC SATELLITE

## 2009-01-07 ENCOUNTER — Ambulatory Visit: Payer: Self-pay | Admitting: Hematology & Oncology

## 2009-01-08 LAB — CBC WITH DIFFERENTIAL (CANCER CENTER ONLY)
BASO%: 0.2 % (ref 0.0–2.0)
EOS%: 8.6 % — ABNORMAL HIGH (ref 0.0–7.0)
LYMPH#: 1 10*3/uL (ref 0.9–3.3)
MCHC: 26.8 g/dL — ABNORMAL LOW (ref 32.0–36.0)
NEUT#: 3.3 10*3/uL (ref 1.5–6.5)
Platelets: 308 10*3/uL (ref 145–400)
RDW: 20.6 % — ABNORMAL HIGH (ref 10.5–14.6)

## 2009-01-22 LAB — CBC WITH DIFFERENTIAL (CANCER CENTER ONLY)
BASO%: 0.3 % (ref 0.0–2.0)
LYMPH#: 0.9 10*3/uL (ref 0.9–3.3)
MONO#: 0.4 10*3/uL (ref 0.1–0.9)
Platelets: 287 10*3/uL (ref 145–400)
RDW: 20 % — ABNORMAL HIGH (ref 10.5–14.6)
WBC: 4.6 10*3/uL (ref 3.9–10.0)

## 2009-02-05 LAB — CBC WITH DIFFERENTIAL (CANCER CENTER ONLY)
BASO#: 0 10*3/uL (ref 0.0–0.2)
Eosinophils Absolute: 0.2 10*3/uL (ref 0.0–0.5)
HGB: 7.5 g/dL — ABNORMAL LOW (ref 11.6–15.9)
LYMPH#: 1 10*3/uL (ref 0.9–3.3)
MONO#: 0.6 10*3/uL (ref 0.1–0.9)
NEUT#: 5.2 10*3/uL (ref 1.5–6.5)
Platelets: 328 10*3/uL (ref 145–400)
RBC: 5.12 10*6/uL (ref 3.70–5.32)
WBC: 7.1 10*3/uL (ref 3.9–10.0)

## 2009-02-05 LAB — TECHNOLOGIST REVIEW CHCC SATELLITE

## 2009-02-06 ENCOUNTER — Ambulatory Visit: Payer: Self-pay | Admitting: Hematology & Oncology

## 2009-02-12 LAB — CBC WITH DIFFERENTIAL (CANCER CENTER ONLY)
BASO#: 0 10*3/uL (ref 0.0–0.2)
Eosinophils Absolute: 0.1 10*3/uL (ref 0.0–0.5)
HCT: 19.3 % — ABNORMAL LOW (ref 34.8–46.6)
HGB: 5.2 g/dL — CL (ref 11.6–15.9)
LYMPH%: 17.5 % (ref 14.0–48.0)
MCH: 14.6 pg — ABNORMAL LOW (ref 26.0–34.0)
MCV: 54 fL — ABNORMAL LOW (ref 81–101)
MONO#: 0.4 10*3/uL (ref 0.1–0.9)
MONO%: 8.1 % (ref 0.0–13.0)
NEUT%: 71.3 % (ref 39.6–80.0)
Platelets: 295 10*3/uL (ref 145–400)
RBC: 3.6 10*6/uL — ABNORMAL LOW (ref 3.70–5.32)
WBC: 4.7 10*3/uL (ref 3.9–10.0)

## 2009-02-12 LAB — TECHNOLOGIST REVIEW CHCC SATELLITE

## 2009-02-19 LAB — CBC WITH DIFFERENTIAL (CANCER CENTER ONLY)
BASO%: 0.2 % (ref 0.0–2.0)
EOS%: 4.2 % (ref 0.0–7.0)
HCT: 21.5 % — ABNORMAL LOW (ref 34.8–46.6)
LYMPH%: 21.4 % (ref 14.0–48.0)
MCHC: 27.2 g/dL — ABNORMAL LOW (ref 32.0–36.0)
MCV: 54 fL — ABNORMAL LOW (ref 81–101)
NEUT%: 63.3 % (ref 39.6–80.0)
Platelets: 238 10*3/uL (ref 145–400)
RDW: 19.3 % — ABNORMAL HIGH (ref 10.5–14.6)

## 2009-02-26 LAB — CBC WITH DIFFERENTIAL (CANCER CENTER ONLY)
BASO#: 0 10*3/uL (ref 0.0–0.2)
EOS%: 3.7 % (ref 0.0–7.0)
HCT: 21.4 % — ABNORMAL LOW (ref 34.8–46.6)
HGB: 5.9 g/dL — CL (ref 11.6–15.9)
LYMPH%: 19.7 % (ref 14.0–48.0)
MCH: 14.9 pg — ABNORMAL LOW (ref 26.0–34.0)
MCHC: 27.7 g/dL — ABNORMAL LOW (ref 32.0–36.0)
MCV: 54 fL — ABNORMAL LOW (ref 81–101)
MONO%: 10.9 % (ref 0.0–13.0)
NEUT%: 65.5 % (ref 39.6–80.0)

## 2009-02-26 LAB — TECHNOLOGIST REVIEW CHCC SATELLITE

## 2009-03-05 LAB — MANUAL DIFFERENTIAL (CHCC SATELLITE)
Eos: 3 % (ref 0–7)
SEG: 79 % — ABNORMAL HIGH (ref 40–75)
nRBC: 1 % — ABNORMAL HIGH (ref 0–0)

## 2009-03-05 LAB — CBC WITH DIFFERENTIAL (CANCER CENTER ONLY)
MCH: 14.8 pg — ABNORMAL LOW (ref 26.0–34.0)
MCHC: 27.2 g/dL — ABNORMAL LOW (ref 32.0–36.0)
Platelets: 328 10*3/uL (ref 145–400)

## 2009-03-06 LAB — COMPREHENSIVE METABOLIC PANEL
Albumin: 4.5 g/dL (ref 3.5–5.2)
Alkaline Phosphatase: 56 U/L (ref 39–117)
BUN: 8 mg/dL (ref 6–23)
Calcium: 8.8 mg/dL (ref 8.4–10.5)
Chloride: 98 mEq/L (ref 96–112)
Creatinine, Ser: 0.71 mg/dL (ref 0.40–1.20)
Glucose, Bld: 93 mg/dL (ref 70–99)
Potassium: 3.8 mEq/L (ref 3.5–5.3)

## 2009-03-06 LAB — VITAMIN D 25 HYDROXY (VIT D DEFICIENCY, FRACTURES): Vit D, 25-Hydroxy: 39 ng/mL (ref 30–89)

## 2009-03-12 ENCOUNTER — Ambulatory Visit: Payer: Self-pay | Admitting: Hematology & Oncology

## 2009-03-12 LAB — CBC WITH DIFFERENTIAL (CANCER CENTER ONLY)
BASO%: 0.2 % (ref 0.0–2.0)
EOS%: 3.3 % (ref 0.0–7.0)
LYMPH#: 1 10*3/uL (ref 0.9–3.3)
LYMPH%: 26.7 % (ref 14.0–48.0)
MCHC: 27.8 g/dL — ABNORMAL LOW (ref 32.0–36.0)
MONO#: 0.4 10*3/uL (ref 0.1–0.9)
Platelets: 269 10*3/uL (ref 145–400)
RDW: 20.3 % — ABNORMAL HIGH (ref 10.5–14.6)
WBC: 3.9 10*3/uL (ref 3.9–10.0)

## 2009-03-19 LAB — TECHNOLOGIST REVIEW CHCC SATELLITE

## 2009-03-19 LAB — CBC WITH DIFFERENTIAL (CANCER CENTER ONLY)
BASO%: 0.3 % (ref 0.0–2.0)
EOS%: 3.8 % (ref 0.0–7.0)
HCT: 22.5 % — ABNORMAL LOW (ref 34.8–46.6)
HGB: 6.1 g/dL — CL (ref 11.6–15.9)
LYMPH#: 0.8 10*3/uL — ABNORMAL LOW (ref 0.9–3.3)
MONO#: 0.4 10*3/uL (ref 0.1–0.9)
NEUT#: 2.6 10*3/uL (ref 1.5–6.5)
NEUT%: 65.2 % (ref 39.6–80.0)
RDW: 20.3 % — ABNORMAL HIGH (ref 10.5–14.6)
WBC: 3.9 10*3/uL (ref 3.9–10.0)

## 2009-03-26 LAB — CBC WITH DIFFERENTIAL (CANCER CENTER ONLY)
BASO%: 0.4 % (ref 0.0–2.0)
HCT: 23.8 % — ABNORMAL LOW (ref 34.8–46.6)
LYMPH%: 27.6 % (ref 14.0–48.0)
MCH: 14.7 pg — ABNORMAL LOW (ref 26.0–34.0)
MCV: 55 fL — ABNORMAL LOW (ref 81–101)
MONO#: 0.3 10*3/uL (ref 0.1–0.9)
MONO%: 9.3 % (ref 0.0–13.0)
NEUT%: 56.9 % (ref 39.6–80.0)
RDW: 18.1 % — ABNORMAL HIGH (ref 10.5–14.6)
WBC: 3.6 10*3/uL — ABNORMAL LOW (ref 3.9–10.0)

## 2009-04-02 LAB — CBC WITH DIFFERENTIAL (CANCER CENTER ONLY)
BASO#: 0 10*3/uL (ref 0.0–0.2)
BASO%: 0.2 % (ref 0.0–2.0)
HCT: 26.1 % — ABNORMAL LOW (ref 34.8–46.6)
HGB: 6.9 g/dL — CL (ref 11.6–15.9)
LYMPH#: 1 10*3/uL (ref 0.9–3.3)
LYMPH%: 23.3 % (ref 14.0–48.0)
MCV: 56 fL — ABNORMAL LOW (ref 81–101)
MONO#: 0.3 10*3/uL (ref 0.1–0.9)
NEUT%: 64.1 % (ref 39.6–80.0)
RDW: 18.9 % — ABNORMAL HIGH (ref 10.5–14.6)
WBC: 4.5 10*3/uL (ref 3.9–10.0)

## 2009-04-15 ENCOUNTER — Ambulatory Visit: Payer: Self-pay | Admitting: Hematology & Oncology

## 2009-04-16 LAB — CBC WITH DIFFERENTIAL (CANCER CENTER ONLY)
HCT: 27.1 % — ABNORMAL LOW (ref 34.8–46.6)
HGB: 7.1 g/dL — ABNORMAL LOW (ref 11.6–15.9)
MCH: 14.6 pg — ABNORMAL LOW (ref 26.0–34.0)
MCHC: 26.3 g/dL — ABNORMAL LOW (ref 32.0–36.0)
MCV: 55 fL — ABNORMAL LOW (ref 81–101)
RBC: 4.89 10*6/uL (ref 3.70–5.32)

## 2009-04-16 LAB — MANUAL DIFFERENTIAL (CHCC SATELLITE)
BASO: 1 % (ref 0–2)
Band Neutrophils: 2 % (ref 0–10)
LYMPH: 10 % — ABNORMAL LOW (ref 14–48)
MONO: 7 % (ref 0–13)
PLT EST ~~LOC~~: ADEQUATE

## 2009-04-22 LAB — CBC WITH DIFFERENTIAL (CANCER CENTER ONLY)
EOS%: 4.1 % (ref 0.0–7.0)
Eosinophils Absolute: 0.2 10*3/uL (ref 0.0–0.5)
LYMPH%: 17.6 % (ref 14.0–48.0)
MCH: 14.8 pg — ABNORMAL LOW (ref 26.0–34.0)
MCHC: 26.9 g/dL — ABNORMAL LOW (ref 32.0–36.0)
MCV: 55 fL — ABNORMAL LOW (ref 81–101)
MONO%: 7.6 % (ref 0.0–13.0)
NEUT#: 3.5 10*3/uL (ref 1.5–6.5)
Platelets: 289 10*3/uL (ref 145–400)
RBC: 3.64 10*6/uL — ABNORMAL LOW (ref 3.70–5.32)

## 2009-04-30 LAB — CBC WITH DIFFERENTIAL (CANCER CENTER ONLY)
HGB: 5.6 g/dL — CL (ref 11.6–15.9)
MCH: 14.6 pg — ABNORMAL LOW (ref 26.0–34.0)
MCHC: 26.9 g/dL — ABNORMAL LOW (ref 32.0–36.0)
MCV: 54 fL — ABNORMAL LOW (ref 81–101)

## 2009-04-30 LAB — MANUAL DIFFERENTIAL (CHCC SATELLITE)
BASO: 1 % (ref 0–2)
Eos: 3 % (ref 0–7)
LYMPH: 14 % (ref 14–48)
PLT EST ~~LOC~~: ADEQUATE
SEG: 71 % (ref 40–75)
nRBC: 1 % — ABNORMAL HIGH (ref 0–0)

## 2009-04-30 LAB — COMPREHENSIVE METABOLIC PANEL
ALT: 10 U/L (ref 0–35)
AST: 12 U/L (ref 0–37)
Albumin: 4.2 g/dL (ref 3.5–5.2)
CO2: 30 mEq/L (ref 19–32)
Calcium: 8.6 mg/dL (ref 8.4–10.5)
Chloride: 99 mEq/L (ref 96–112)
Creatinine, Ser: 0.65 mg/dL (ref 0.40–1.20)
Potassium: 3.6 mEq/L (ref 3.5–5.3)
Sodium: 137 mEq/L (ref 135–145)
Total Protein: 6.9 g/dL (ref 6.0–8.3)

## 2009-04-30 LAB — CHCC SATELLITE - SMEAR

## 2009-04-30 LAB — FERRITIN: Ferritin: 1 ng/mL — ABNORMAL LOW (ref 10–291)

## 2009-05-14 LAB — CBC WITH DIFFERENTIAL (CANCER CENTER ONLY)
MCH: 14.4 pg — ABNORMAL LOW (ref 26.0–34.0)
MCHC: 26.1 g/dL — ABNORMAL LOW (ref 32.0–36.0)
MCV: 55 fL — ABNORMAL LOW (ref 81–101)
Platelets: 374 10*3/uL (ref 145–400)
RBC: 4.56 10*6/uL (ref 3.70–5.32)
RDW: 19.5 % — ABNORMAL HIGH (ref 10.5–14.6)

## 2009-05-14 LAB — MANUAL DIFFERENTIAL (CHCC SATELLITE)
ANC (CHCC HP manual diff): 3.5 10*3/uL (ref 1.5–6.7)
LYMPH: 18 % (ref 14–48)
PLT EST ~~LOC~~: ADEQUATE
nRBC: 1 % — ABNORMAL HIGH (ref 0–0)

## 2009-05-15 ENCOUNTER — Ambulatory Visit: Payer: Self-pay | Admitting: Hematology & Oncology

## 2009-05-28 LAB — CBC WITH DIFFERENTIAL (CANCER CENTER ONLY)
BASO#: 0 10*3/uL (ref 0.0–0.2)
Eosinophils Absolute: 0.1 10*3/uL (ref 0.0–0.5)
HCT: 23.1 % — ABNORMAL LOW (ref 34.8–46.6)
LYMPH%: 20.4 % (ref 14.0–48.0)
MCH: 14.1 pg — ABNORMAL LOW (ref 26.0–34.0)
MCV: 54 fL — ABNORMAL LOW (ref 81–101)
MONO#: 0.3 10*3/uL (ref 0.1–0.9)
NEUT%: 67.1 % (ref 39.6–80.0)
RBC: 4.27 10*6/uL (ref 3.70–5.32)
WBC: 3.3 10*3/uL — ABNORMAL LOW (ref 3.9–10.0)

## 2009-05-28 LAB — TECHNOLOGIST REVIEW CHCC SATELLITE

## 2009-06-11 LAB — CBC WITH DIFFERENTIAL (CANCER CENTER ONLY)
BASO#: 0 10*3/uL (ref 0.0–0.2)
EOS%: 3.9 % (ref 0.0–7.0)
HCT: 26.3 % — ABNORMAL LOW (ref 34.8–46.6)
HGB: 6.8 g/dL — CL (ref 11.6–15.9)
LYMPH#: 0.9 10*3/uL (ref 0.9–3.3)
LYMPH%: 22.4 % (ref 14.0–48.0)
MCH: 14.2 pg — ABNORMAL LOW (ref 26.0–34.0)
MCHC: 25.8 g/dL — ABNORMAL LOW (ref 32.0–36.0)
MCV: 55 fL — ABNORMAL LOW (ref 81–101)
NEUT%: 61.3 % (ref 39.6–80.0)

## 2009-06-23 ENCOUNTER — Ambulatory Visit: Payer: Self-pay | Admitting: Hematology & Oncology

## 2009-06-25 LAB — CBC WITH DIFFERENTIAL (CANCER CENTER ONLY)
BASO%: 0.2 % (ref 0.0–2.0)
HCT: 25.2 % — ABNORMAL LOW (ref 34.8–46.6)
LYMPH%: 25.4 % (ref 14.0–48.0)
MCH: 14.1 pg — ABNORMAL LOW (ref 26.0–34.0)
MCV: 54 fL — ABNORMAL LOW (ref 81–101)
MONO%: 9.7 % (ref 0.0–13.0)
NEUT%: 59.5 % (ref 39.6–80.0)
Platelets: 263 10*3/uL (ref 145–400)
RDW: 18.4 % — ABNORMAL HIGH (ref 10.5–14.6)

## 2009-06-25 LAB — TECHNOLOGIST REVIEW CHCC SATELLITE

## 2009-07-02 LAB — CBC WITH DIFFERENTIAL (CANCER CENTER ONLY)
BASO%: 0.4 % (ref 0.0–2.0)
Eosinophils Absolute: 0.1 10*3/uL (ref 0.0–0.5)
LYMPH#: 0.9 10*3/uL (ref 0.9–3.3)
LYMPH%: 30.5 % (ref 14.0–48.0)
MCV: 53 fL — ABNORMAL LOW (ref 81–101)
MONO#: 0.4 10*3/uL (ref 0.1–0.9)
NEUT#: 1.6 10*3/uL (ref 1.5–6.5)
Platelets: 281 10*3/uL (ref 145–400)
RBC: 3.99 10*6/uL (ref 3.70–5.32)
RDW: 19.3 % — ABNORMAL HIGH (ref 10.5–14.6)
WBC: 3.1 10*3/uL — ABNORMAL LOW (ref 3.9–10.0)

## 2009-07-02 LAB — TECHNOLOGIST REVIEW CHCC SATELLITE

## 2009-07-09 LAB — CBC WITH DIFFERENTIAL (CANCER CENTER ONLY)
BASO#: 0 10*3/uL (ref 0.0–0.2)
BASO%: 0.3 % (ref 0.0–2.0)
EOS%: 3.9 % (ref 0.0–7.0)
HCT: 23.1 % — ABNORMAL LOW (ref 34.8–46.6)
HGB: 6 g/dL — CL (ref 11.6–15.9)
LYMPH#: 0.9 10*3/uL (ref 0.9–3.3)
MCHC: 25.9 g/dL — ABNORMAL LOW (ref 32.0–36.0)
MONO#: 0.4 10*3/uL (ref 0.1–0.9)
NEUT#: 2.9 10*3/uL (ref 1.5–6.5)
NEUT%: 66.4 % (ref 39.6–80.0)
WBC: 4.4 10*3/uL (ref 3.9–10.0)

## 2009-07-23 ENCOUNTER — Ambulatory Visit: Payer: Self-pay | Admitting: Hematology & Oncology

## 2009-07-23 LAB — CBC WITH DIFFERENTIAL (CANCER CENTER ONLY)
BASO#: 0 10*3/uL (ref 0.0–0.2)
Eosinophils Absolute: 0.1 10*3/uL (ref 0.0–0.5)
HGB: 6.2 g/dL — CL (ref 11.6–15.9)
MCV: 54 fL — ABNORMAL LOW (ref 81–101)
MONO#: 0.5 10*3/uL (ref 0.1–0.9)
NEUT#: 2.7 10*3/uL (ref 1.5–6.5)
Platelets: 205 10*3/uL (ref 145–400)
RBC: 4.39 10*6/uL (ref 3.70–5.32)
WBC: 4.2 10*3/uL (ref 3.9–10.0)

## 2009-08-06 LAB — CBC WITH DIFFERENTIAL (CANCER CENTER ONLY)
BASO%: 0.3 % (ref 0.0–2.0)
Eosinophils Absolute: 0.2 10*3/uL (ref 0.0–0.5)
HCT: 25.4 % — ABNORMAL LOW (ref 34.8–46.6)
HGB: 6.5 g/dL — CL (ref 11.6–15.9)
LYMPH#: 0.9 10*3/uL (ref 0.9–3.3)
MONO#: 0.3 10*3/uL (ref 0.1–0.9)
NEUT%: 66.7 % (ref 39.6–80.0)
RBC: 4.75 10*6/uL (ref 3.70–5.32)
RDW: 19.4 % — ABNORMAL HIGH (ref 10.5–14.6)
WBC: 4.1 10*3/uL (ref 3.9–10.0)

## 2009-08-06 LAB — COMPREHENSIVE METABOLIC PANEL
ALT: <8 U/L (ref 0–35)
AST: 13 U/L (ref 0–37)
Albumin: 4.5 g/dL (ref 3.5–5.2)
BUN: 6 mg/dL (ref 6–23)
Calcium: 8.7 mg/dL (ref 8.4–10.5)
Chloride: 99 mEq/L (ref 96–112)
Potassium: 3.9 mEq/L (ref 3.5–5.3)

## 2009-08-20 LAB — CBC WITH DIFFERENTIAL (CANCER CENTER ONLY)
BASO#: 0 10*3/uL (ref 0.0–0.2)
BASO%: 0.3 % (ref 0.0–2.0)
EOS%: 2.7 % (ref 0.0–7.0)
HGB: 6.2 g/dL — CL (ref 11.6–15.9)
LYMPH#: 0.9 10*3/uL (ref 0.9–3.3)
MCH: 14 pg — ABNORMAL LOW (ref 26.0–34.0)
MCHC: 26.7 g/dL — ABNORMAL LOW (ref 32.0–36.0)
MONO%: 7.3 % (ref 0.0–13.0)
NEUT#: 3.1 10*3/uL (ref 1.5–6.5)
Platelets: 280 10*3/uL (ref 145–400)

## 2009-08-20 LAB — TECHNOLOGIST REVIEW CHCC SATELLITE

## 2009-09-01 ENCOUNTER — Ambulatory Visit: Payer: Self-pay | Admitting: Hematology & Oncology

## 2009-09-03 LAB — CBC WITH DIFFERENTIAL (CANCER CENTER ONLY)
BASO%: 0.2 % (ref 0.0–2.0)
Eosinophils Absolute: 0.1 10*3/uL (ref 0.0–0.5)
LYMPH#: 1 10*3/uL (ref 0.9–3.3)
MONO#: 0.4 10*3/uL (ref 0.1–0.9)
Platelets: 322 10*3/uL (ref 145–400)
RBC: 4.38 10*6/uL (ref 3.70–5.32)
RDW: 19.4 % — ABNORMAL HIGH (ref 10.5–14.6)
WBC: 4.6 10*3/uL (ref 3.9–10.0)

## 2009-09-17 LAB — MANUAL DIFFERENTIAL (CHCC SATELLITE)
ALC: 0.7 10*3/uL (ref 0.6–2.2)
Eos: 2 % (ref 0–7)
LYMPH: 18 % (ref 14–48)
MONO: 7 % (ref 0–13)
SEG: 73 % (ref 40–75)

## 2009-09-17 LAB — CBC WITH DIFFERENTIAL (CANCER CENTER ONLY)
HCT: 24.3 % — ABNORMAL LOW (ref 34.8–46.6)
HGB: 6.5 g/dL — CL (ref 11.6–15.9)
MCV: 52 fL — ABNORMAL LOW (ref 81–101)
RBC: 4.72 10*6/uL (ref 3.70–5.32)
WBC: 4.2 10*3/uL (ref 3.9–10.0)

## 2009-09-30 ENCOUNTER — Ambulatory Visit: Payer: Self-pay | Admitting: Hematology & Oncology

## 2009-10-01 LAB — COMPREHENSIVE METABOLIC PANEL
Alkaline Phosphatase: 46 U/L (ref 39–117)
BUN: 4 mg/dL — ABNORMAL LOW (ref 6–23)
CO2: 23 mEq/L (ref 19–32)
Glucose, Bld: 109 mg/dL — ABNORMAL HIGH (ref 70–99)
Total Bilirubin: 0.4 mg/dL (ref 0.3–1.2)
Total Protein: 6.6 g/dL (ref 6.0–8.3)

## 2009-10-01 LAB — CBC WITH DIFFERENTIAL (CANCER CENTER ONLY)
BASO#: 0 10*3/uL (ref 0.0–0.2)
EOS%: 3.2 % (ref 0.0–7.0)
Eosinophils Absolute: 0.1 10*3/uL (ref 0.0–0.5)
HCT: 22.4 % — ABNORMAL LOW (ref 34.8–46.6)
HGB: 5.9 g/dL — CL (ref 11.6–15.9)
LYMPH%: 26 % (ref 14.0–48.0)
MCH: 13.6 pg — ABNORMAL LOW (ref 26.0–34.0)
MCHC: 26.4 g/dL — ABNORMAL LOW (ref 32.0–36.0)
MCV: 51 fL — ABNORMAL LOW (ref 81–101)
MONO%: 9.4 % (ref 0.0–13.0)
NEUT%: 61.1 % (ref 39.6–80.0)
RBC: 4.35 10*6/uL (ref 3.70–5.32)

## 2009-10-01 LAB — FERRITIN: Ferritin: <1 ng/mL — ABNORMAL LOW (ref 10–291)

## 2009-10-15 LAB — TECHNOLOGIST REVIEW CHCC SATELLITE

## 2009-10-15 LAB — CBC WITH DIFFERENTIAL (CANCER CENTER ONLY)
BASO#: 0 10*3/uL (ref 0.0–0.2)
EOS%: 3.7 % (ref 0.0–7.0)
Eosinophils Absolute: 0.2 10*3/uL (ref 0.0–0.5)
HGB: 6.3 g/dL — CL (ref 11.6–15.9)
LYMPH#: 1 10*3/uL (ref 0.9–3.3)
MCH: 13.5 pg — ABNORMAL LOW (ref 26.0–34.0)
MONO%: 9.4 % (ref 0.0–13.0)
NEUT#: 2.6 10*3/uL (ref 1.5–6.5)
Platelets: 299 10*3/uL (ref 145–400)
RBC: 4.68 10*6/uL (ref 3.70–5.32)

## 2009-11-10 ENCOUNTER — Ambulatory Visit: Payer: Self-pay | Admitting: Hematology & Oncology

## 2009-11-12 LAB — CBC WITH DIFFERENTIAL (CANCER CENTER ONLY)
BASO#: 0 10*3/uL (ref 0.0–0.2)
Eosinophils Absolute: 0.1 10*3/uL (ref 0.0–0.5)
HGB: 6.1 g/dL — CL (ref 11.6–15.9)
LYMPH#: 1.1 10*3/uL (ref 0.9–3.3)
NEUT#: 2.5 10*3/uL (ref 1.5–6.5)
RBC: 4.54 10*6/uL (ref 3.70–5.32)
WBC: 4.2 10*3/uL (ref 3.9–10.0)

## 2009-11-19 LAB — CBC WITH DIFFERENTIAL (CANCER CENTER ONLY)
Eosinophils Absolute: 0.2 10*3/uL (ref 0.0–0.5)
HCT: 23.5 % — ABNORMAL LOW (ref 34.8–46.6)
LYMPH#: 1 10*3/uL (ref 0.9–3.3)
LYMPH%: 23.5 % (ref 14.0–48.0)
MCV: 51 fL — ABNORMAL LOW (ref 81–101)
MONO#: 0.4 10*3/uL (ref 0.1–0.9)
NEUT%: 62.8 % (ref 39.6–80.0)
Platelets: 285 10*3/uL (ref 145–400)
RBC: 4.56 10*6/uL (ref 3.70–5.32)
WBC: 4.1 10*3/uL (ref 3.9–10.0)

## 2009-11-26 LAB — CBC WITH DIFFERENTIAL (CANCER CENTER ONLY)
BASO#: 0 10*3/uL (ref 0.0–0.2)
BASO%: 0.4 % (ref 0.0–2.0)
Eosinophils Absolute: 0.2 10*3/uL (ref 0.0–0.5)
HCT: 24.2 % — ABNORMAL LOW (ref 34.8–46.6)
HGB: 6.3 g/dL — CL (ref 11.6–15.9)
LYMPH%: 20.3 % (ref 14.0–48.0)
MCV: 52 fL — ABNORMAL LOW (ref 81–101)
MONO#: 0.5 10*3/uL (ref 0.1–0.9)
NEUT%: 63.9 % (ref 39.6–80.0)
RDW: 19.4 % — ABNORMAL HIGH (ref 10.5–14.6)
WBC: 4.2 10*3/uL (ref 3.9–10.0)

## 2009-12-03 ENCOUNTER — Ambulatory Visit: Payer: Self-pay | Admitting: Cardiology

## 2009-12-04 ENCOUNTER — Ambulatory Visit (HOSPITAL_COMMUNITY): Admission: RE | Admit: 2009-12-04 | Discharge: 2009-12-04 | Payer: Self-pay | Admitting: Cardiology

## 2009-12-04 ENCOUNTER — Ambulatory Visit: Payer: Self-pay | Admitting: Cardiology

## 2009-12-10 ENCOUNTER — Ambulatory Visit: Payer: Self-pay | Admitting: Hematology & Oncology

## 2009-12-24 LAB — CBC WITH DIFFERENTIAL (CANCER CENTER ONLY)
BASO%: 0.2 % (ref 0.0–2.0)
HCT: 21.1 % — ABNORMAL LOW (ref 34.8–46.6)
LYMPH#: 1 10*3/uL (ref 0.9–3.3)
MONO#: 0.5 10*3/uL (ref 0.1–0.9)
Platelets: 272 10*3/uL (ref 145–400)
RDW: 19.7 % — ABNORMAL HIGH (ref 10.5–14.6)
WBC: 4 10*3/uL (ref 3.9–10.0)

## 2009-12-24 LAB — TECHNOLOGIST REVIEW CHCC SATELLITE

## 2010-01-07 LAB — CBC WITH DIFFERENTIAL (CANCER CENTER ONLY)
BASO%: 0.2 % (ref 0.0–2.0)
LYMPH%: 22.2 % (ref 14.0–48.0)
MCH: 13.6 pg — ABNORMAL LOW (ref 26.0–34.0)
MCV: 52 fL — ABNORMAL LOW (ref 81–101)
MONO#: 0.4 10*3/uL (ref 0.1–0.9)
MONO%: 10.7 % (ref 0.0–13.0)
NEUT#: 2.4 10*3/uL (ref 1.5–6.5)
Platelets: 312 10*3/uL (ref 145–400)
RDW: 19.6 % — ABNORMAL HIGH (ref 10.5–14.6)
WBC: 3.8 10*3/uL — ABNORMAL LOW (ref 3.9–10.0)

## 2010-01-07 LAB — TECHNOLOGIST REVIEW CHCC SATELLITE

## 2010-01-19 ENCOUNTER — Ambulatory Visit: Payer: Self-pay | Admitting: Hematology & Oncology

## 2010-01-21 LAB — CBC WITH DIFFERENTIAL (CANCER CENTER ONLY)
BASO%: 0.2 % (ref 0.0–2.0)
EOS%: 8.4 % — ABNORMAL HIGH (ref 0.0–7.0)
LYMPH#: 1 10*3/uL (ref 0.9–3.3)
MCHC: 26.2 g/dL — ABNORMAL LOW (ref 32.0–36.0)
MONO#: 0.5 10*3/uL (ref 0.1–0.9)
NEUT#: 2.5 10*3/uL (ref 1.5–6.5)
RDW: 19.6 % — ABNORMAL HIGH (ref 10.5–14.6)
WBC: 4.3 10*3/uL (ref 3.9–10.0)

## 2010-01-21 LAB — TECHNOLOGIST REVIEW CHCC SATELLITE

## 2010-02-04 LAB — COMPREHENSIVE METABOLIC PANEL
ALT: <8 U/L (ref 0–35)
AST: 14 U/L (ref 0–37)
CO2: 24 mEq/L (ref 19–32)
Calcium: 9 mg/dL (ref 8.4–10.5)
Chloride: 97 mEq/L (ref 96–112)
Sodium: 136 mEq/L (ref 135–145)
Total Bilirubin: 0.4 mg/dL (ref 0.3–1.2)
Total Protein: 6.9 g/dL (ref 6.0–8.3)

## 2010-02-04 LAB — CBC WITH DIFFERENTIAL (CANCER CENTER ONLY)
BASO%: 0.3 % (ref 0.0–2.0)
Eosinophils Absolute: 0.2 10*3/uL (ref 0.0–0.5)
MONO#: 0.5 10*3/uL (ref 0.1–0.9)
NEUT#: 2.4 10*3/uL (ref 1.5–6.5)
Platelets: 334 10*3/uL (ref 145–400)
RBC: 4.58 10*6/uL (ref 3.70–5.32)
WBC: 4.4 10*3/uL (ref 3.9–10.0)

## 2010-02-04 LAB — FERRITIN: Ferritin: <1 ng/mL — ABNORMAL LOW (ref 10–291)

## 2010-02-04 LAB — TECHNOLOGIST REVIEW CHCC SATELLITE

## 2010-02-18 ENCOUNTER — Ambulatory Visit: Payer: Self-pay | Admitting: Hematology & Oncology

## 2010-02-18 LAB — CBC WITH DIFFERENTIAL (CANCER CENTER ONLY)
BASO#: 0 10*3/uL (ref 0.0–0.2)
EOS%: 4.6 % (ref 0.0–7.0)
HCT: 20.4 % — ABNORMAL LOW (ref 34.8–46.6)
HGB: 5.3 g/dL — CL (ref 11.6–15.9)
MCH: 13.3 pg — ABNORMAL LOW (ref 26.0–34.0)
MCHC: 26.1 g/dL — ABNORMAL LOW (ref 32.0–36.0)
MCV: 51 fL — ABNORMAL LOW (ref 81–101)
MONO%: 12.3 % (ref 0.0–13.0)
NEUT%: 59.3 % (ref 39.6–80.0)

## 2010-03-04 LAB — CBC WITH DIFFERENTIAL (CANCER CENTER ONLY)
BASO%: 0.2 % (ref 0.0–2.0)
HCT: 21.1 % — ABNORMAL LOW (ref 34.8–46.6)
LYMPH%: 22.6 % (ref 14.0–48.0)
MCHC: 26.3 g/dL — ABNORMAL LOW (ref 32.0–36.0)
MCV: 51 fL — ABNORMAL LOW (ref 81–101)
MONO#: 0.4 10*3/uL (ref 0.1–0.9)
NEUT%: 61.3 % (ref 39.6–80.0)
RDW: 19.7 % — ABNORMAL HIGH (ref 10.5–14.6)

## 2010-03-18 LAB — COMPREHENSIVE METABOLIC PANEL
Albumin: 4.3 g/dL (ref 3.5–5.2)
BUN: 6 mg/dL (ref 6–23)
CO2: 29 mEq/L (ref 19–32)
Calcium: 8.9 mg/dL (ref 8.4–10.5)
Chloride: 98 mEq/L (ref 96–112)
Glucose, Bld: 108 mg/dL — ABNORMAL HIGH (ref 70–99)
Potassium: 3.7 mEq/L (ref 3.5–5.3)

## 2010-03-18 LAB — CBC WITH DIFFERENTIAL (CANCER CENTER ONLY)
BASO#: 0 10*3/uL (ref 0.0–0.2)
EOS%: 5.8 % (ref 0.0–7.0)
HGB: 5.7 g/dL — CL (ref 11.6–15.9)
MCH: 13.5 pg — ABNORMAL LOW (ref 26.0–34.0)
MCHC: 26 g/dL — ABNORMAL LOW (ref 32.0–36.0)
MONO%: 10.5 % (ref 0.0–13.0)
NEUT#: 2.4 10*3/uL (ref 1.5–6.5)
NEUT%: 59.5 % (ref 39.6–80.0)

## 2010-03-18 LAB — FERRITIN: Ferritin: 1 ng/mL — ABNORMAL LOW (ref 10–291)

## 2010-03-18 LAB — RETICULOCYTES (CHCC): Retic Ct Pct: 1.1 % (ref 0.4–3.1)

## 2010-03-31 ENCOUNTER — Ambulatory Visit (HOSPITAL_BASED_OUTPATIENT_CLINIC_OR_DEPARTMENT_OTHER): Payer: Medicare Other | Admitting: Hematology & Oncology

## 2010-04-02 LAB — CBC WITH DIFFERENTIAL (CANCER CENTER ONLY)
BASO#: 0 10*3/uL (ref 0.0–0.2)
BASO%: 0.1 % (ref 0.0–2.0)
EOS%: 3.4 % (ref 0.0–7.0)
Eosinophils Absolute: 0.1 10*3/uL (ref 0.0–0.5)
HCT: 24.3 % — ABNORMAL LOW (ref 34.8–46.6)
HGB: 6.4 g/dL — CL (ref 11.6–15.9)
LYMPH#: 0.7 10*3/uL — ABNORMAL LOW (ref 0.9–3.3)
LYMPH%: 17.4 % (ref 14.0–48.0)
MCH: 13.7 pg — ABNORMAL LOW (ref 26.0–34.0)
MCHC: 26.2 g/dL — ABNORMAL LOW (ref 32.0–36.0)
MCV: 52 fL — ABNORMAL LOW (ref 81–101)
MONO#: 0.4 10*3/uL (ref 0.1–0.9)
MONO%: 10.6 % (ref 0.0–13.0)
NEUT#: 2.7 10*3/uL (ref 1.5–6.5)
NEUT%: 68.5 % (ref 39.6–80.0)
Platelets: 240 10*3/uL (ref 145–400)
RBC: 4.65 10*6/uL (ref 3.70–5.32)
RDW: 20 % — ABNORMAL HIGH (ref 10.5–14.6)
WBC: 4 10*3/uL (ref 3.9–10.0)

## 2010-04-02 LAB — TECHNOLOGIST REVIEW CHCC SATELLITE

## 2010-04-15 LAB — CBC WITH DIFFERENTIAL (CANCER CENTER ONLY)
BASO#: 0 10*3/uL (ref 0.0–0.2)
BASO%: 0.3 % (ref 0.0–2.0)
EOS%: 3 % (ref 0.0–7.0)
Eosinophils Absolute: 0.2 10*3/uL (ref 0.0–0.5)
HCT: 22.3 % — ABNORMAL LOW (ref 34.8–46.6)
HGB: 5.8 g/dL — CL (ref 11.6–15.9)
LYMPH#: 1.1 10*3/uL (ref 0.9–3.3)
LYMPH%: 20 % (ref 14.0–48.0)
MCH: 13.8 pg — ABNORMAL LOW (ref 26.0–34.0)
MCHC: 26.1 g/dL — ABNORMAL LOW (ref 32.0–36.0)
MCV: 53 fL — ABNORMAL LOW (ref 81–101)
MONO#: 0.6 10*3/uL (ref 0.1–0.9)
MONO%: 10.7 % (ref 0.0–13.0)
NEUT#: 3.5 10*3/uL (ref 1.5–6.5)
NEUT%: 66 % (ref 39.6–80.0)
Platelets: 371 10*3/uL (ref 145–400)
RBC: 4.24 10*6/uL (ref 3.70–5.32)
RDW: 19.8 % — ABNORMAL HIGH (ref 10.5–14.6)
WBC: 5.2 10*3/uL (ref 3.9–10.0)

## 2010-04-29 ENCOUNTER — Encounter (HOSPITAL_BASED_OUTPATIENT_CLINIC_OR_DEPARTMENT_OTHER): Payer: Medicare Other | Admitting: Hematology & Oncology

## 2010-04-29 DIAGNOSIS — IMO0001 Reserved for inherently not codable concepts without codable children: Secondary | ICD-10-CM

## 2010-04-29 DIAGNOSIS — D45 Polycythemia vera: Secondary | ICD-10-CM

## 2010-04-29 LAB — CBC WITH DIFFERENTIAL (CANCER CENTER ONLY)
Eosinophils Absolute: 0.1 10*3/uL (ref 0.0–0.5)
LYMPH#: 1.1 10*3/uL (ref 0.9–3.3)
MONO#: 0.5 10*3/uL (ref 0.1–0.9)
MONO%: 10.7 % (ref 0.0–13.0)
NEUT#: 3.3 10*3/uL (ref 1.5–6.5)
Platelets: 268 10*3/uL (ref 145–400)
RBC: 4.32 10*6/uL (ref 3.70–5.32)
WBC: 5 10*3/uL (ref 3.9–10.0)

## 2010-04-29 LAB — TECHNOLOGIST REVIEW CHCC SATELLITE

## 2010-05-12 ENCOUNTER — Ambulatory Visit (INDEPENDENT_AMBULATORY_CARE_PROVIDER_SITE_OTHER): Payer: Medicare Other | Admitting: Cardiology

## 2010-05-12 DIAGNOSIS — I119 Hypertensive heart disease without heart failure: Secondary | ICD-10-CM

## 2010-05-12 DIAGNOSIS — Z79899 Other long term (current) drug therapy: Secondary | ICD-10-CM

## 2010-05-12 DIAGNOSIS — E039 Hypothyroidism, unspecified: Secondary | ICD-10-CM

## 2010-05-13 ENCOUNTER — Encounter (HOSPITAL_BASED_OUTPATIENT_CLINIC_OR_DEPARTMENT_OTHER): Payer: Medicare Other | Admitting: Hematology & Oncology

## 2010-05-13 ENCOUNTER — Other Ambulatory Visit: Payer: Self-pay | Admitting: Hematology & Oncology

## 2010-05-13 DIAGNOSIS — D45 Polycythemia vera: Secondary | ICD-10-CM

## 2010-05-13 DIAGNOSIS — IMO0001 Reserved for inherently not codable concepts without codable children: Secondary | ICD-10-CM

## 2010-05-13 LAB — CBC WITH DIFFERENTIAL (CANCER CENTER ONLY)
BASO%: 0.3 % (ref 0.0–2.0)
HCT: 24.9 % — ABNORMAL LOW (ref 34.8–46.6)
LYMPH#: 1 10*3/uL (ref 0.9–3.3)
MONO#: 0.5 10*3/uL (ref 0.1–0.9)
NEUT%: 60.5 % (ref 39.6–80.0)
RBC: 4.67 10*6/uL (ref 3.70–5.32)
RDW: 20.1 % — ABNORMAL HIGH (ref 10.5–14.6)
WBC: 4.2 10*3/uL (ref 3.9–10.0)

## 2010-05-13 LAB — FERRITIN: Ferritin: 2 ng/mL — ABNORMAL LOW (ref 10–291)

## 2010-05-13 LAB — CHCC SATELLITE - SMEAR

## 2010-05-27 ENCOUNTER — Encounter (HOSPITAL_BASED_OUTPATIENT_CLINIC_OR_DEPARTMENT_OTHER): Payer: Medicare Other | Admitting: Hematology & Oncology

## 2010-05-27 ENCOUNTER — Other Ambulatory Visit: Payer: Self-pay | Admitting: Hematology & Oncology

## 2010-05-27 DIAGNOSIS — IMO0001 Reserved for inherently not codable concepts without codable children: Secondary | ICD-10-CM

## 2010-05-27 DIAGNOSIS — D45 Polycythemia vera: Secondary | ICD-10-CM

## 2010-05-27 LAB — CBC WITH DIFFERENTIAL (CANCER CENTER ONLY)
Eosinophils Absolute: 0.1 10*3/uL (ref 0.0–0.5)
LYMPH%: 20.7 % (ref 14.0–48.0)
MCH: 14.4 pg — ABNORMAL LOW (ref 26.0–34.0)
MCV: 54 fL — ABNORMAL LOW (ref 81–101)
MONO%: 10.7 % (ref 0.0–13.0)
Platelets: 256 10*3/uL (ref 145–400)
RBC: 4.08 10*6/uL (ref 3.70–5.32)
RDW: 19.5 % — ABNORMAL HIGH (ref 10.5–14.6)

## 2010-05-27 LAB — TECHNOLOGIST REVIEW CHCC SATELLITE

## 2010-06-10 ENCOUNTER — Encounter (HOSPITAL_BASED_OUTPATIENT_CLINIC_OR_DEPARTMENT_OTHER): Payer: Medicare Other | Admitting: Hematology & Oncology

## 2010-06-10 ENCOUNTER — Other Ambulatory Visit: Payer: Self-pay | Admitting: Hematology & Oncology

## 2010-06-10 DIAGNOSIS — D45 Polycythemia vera: Secondary | ICD-10-CM

## 2010-06-10 DIAGNOSIS — IMO0001 Reserved for inherently not codable concepts without codable children: Secondary | ICD-10-CM

## 2010-06-10 LAB — CBC WITH DIFFERENTIAL (CANCER CENTER ONLY)
BASO%: 0.2 % (ref 0.0–2.0)
EOS%: 4.5 % (ref 0.0–7.0)
HCT: 24.4 % — ABNORMAL LOW (ref 34.8–46.6)
LYMPH#: 0.7 10*3/uL — ABNORMAL LOW (ref 0.9–3.3)
LYMPH%: 17.9 % (ref 14.0–48.0)
MCH: 13.9 pg — ABNORMAL LOW (ref 26.0–34.0)
MCHC: 23.4 g/dL — ABNORMAL LOW (ref 32.0–36.0)
MCV: 60 fL — ABNORMAL LOW (ref 81–101)
MONO%: 14.9 % — ABNORMAL HIGH (ref 0.0–13.0)
NEUT%: 62.5 % (ref 39.6–80.0)
RDW: 24.1 % — ABNORMAL HIGH (ref 11.1–15.7)

## 2010-06-17 ENCOUNTER — Other Ambulatory Visit: Payer: Self-pay | Admitting: *Deleted

## 2010-06-17 DIAGNOSIS — I10 Essential (primary) hypertension: Secondary | ICD-10-CM

## 2010-06-17 MED ORDER — LISINOPRIL 10 MG PO TABS: 10.0000 mg | ORAL_TABLET | Freq: Every day | ORAL | Status: DC

## 2010-06-24 ENCOUNTER — Other Ambulatory Visit: Payer: Self-pay | Admitting: Hematology & Oncology

## 2010-06-24 ENCOUNTER — Encounter: Payer: Medicare Other | Admitting: Hematology & Oncology

## 2010-06-24 LAB — CBC WITH DIFFERENTIAL (CANCER CENTER ONLY)
EOS%: 6.9 % (ref 0.0–7.0)
LYMPH%: 18.6 % (ref 14.0–48.0)
MCH: 14 pg — ABNORMAL LOW (ref 26.0–34.0)
MCHC: 23.5 g/dL — ABNORMAL LOW (ref 32.0–36.0)
MONO%: 11.5 % (ref 0.0–13.0)
NEUT#: 3.2 10*3/uL (ref 1.5–6.5)
Platelets: 260 10*3/uL (ref 145–400)
RDW: 25 % — ABNORMAL HIGH (ref 11.1–15.7)

## 2010-06-24 LAB — FERRITIN: Ferritin: 2 ng/mL — ABNORMAL LOW (ref 10–291)

## 2010-06-24 LAB — TECHNOLOGIST REVIEW CHCC SATELLITE

## 2010-06-29 ENCOUNTER — Encounter (HOSPITAL_BASED_OUTPATIENT_CLINIC_OR_DEPARTMENT_OTHER): Payer: Medicare Other | Admitting: Hematology & Oncology

## 2010-06-29 DIAGNOSIS — D45 Polycythemia vera: Secondary | ICD-10-CM

## 2010-07-13 ENCOUNTER — Other Ambulatory Visit: Payer: Self-pay | Admitting: Hematology & Oncology

## 2010-07-13 ENCOUNTER — Encounter (HOSPITAL_BASED_OUTPATIENT_CLINIC_OR_DEPARTMENT_OTHER): Payer: Medicare Other | Admitting: Hematology & Oncology

## 2010-07-13 DIAGNOSIS — IMO0001 Reserved for inherently not codable concepts without codable children: Secondary | ICD-10-CM

## 2010-07-13 DIAGNOSIS — D45 Polycythemia vera: Secondary | ICD-10-CM

## 2010-07-13 LAB — CARDIAC PANEL(CRET KIN+CKTOT+MB+TROPI)
Relative Index: INVALID (ref 0.0–2.5)
Total CK: 30 U/L (ref 7–177)
Troponin I: 0.02 ng/mL (ref 0.00–0.06)

## 2010-07-13 LAB — BASIC METABOLIC PANEL
Calcium: 8.5 mg/dL (ref 8.4–10.5)
GFR calc Af Amer: >60 mL/min (ref >60)
GFR calc non Af Amer: >60 mL/min (ref >60)
Glucose, Bld: 88 mg/dL (ref 70–99)
Potassium: 4.1 mEq/L (ref 3.5–5.1)
Sodium: 138 mEq/L (ref 135–145)

## 2010-07-13 LAB — CBC WITH DIFFERENTIAL (CANCER CENTER ONLY)
EOS%: 6 % (ref 0.0–7.0)
Eosinophils Absolute: 0.2 10*3/uL (ref 0.0–0.5)
LYMPH%: 21.5 % (ref 14.0–48.0)
MCH: 14.1 pg — ABNORMAL LOW (ref 26.0–34.0)
MCHC: 23.9 g/dL — ABNORMAL LOW (ref 32.0–36.0)
MCV: 59 fL — ABNORMAL LOW (ref 81–101)
MONO%: 14.8 % — ABNORMAL HIGH (ref 0.0–13.0)
Platelets: 207 10*3/uL (ref 145–400)
RBC: 4.1 10*6/uL (ref 3.70–5.32)

## 2010-07-13 LAB — TECHNOLOGIST REVIEW CHCC SATELLITE

## 2010-07-15 ENCOUNTER — Other Ambulatory Visit: Payer: Self-pay | Admitting: *Deleted

## 2010-07-15 DIAGNOSIS — R609 Edema, unspecified: Secondary | ICD-10-CM

## 2010-07-15 DIAGNOSIS — K219 Gastro-esophageal reflux disease without esophagitis: Secondary | ICD-10-CM

## 2010-07-15 MED ORDER — FAMOTIDINE 20 MG PO TABS: 20.0000 mg | ORAL_TABLET | Freq: Two times a day (BID) | ORAL | Status: DC

## 2010-07-15 MED ORDER — FUROSEMIDE 40 MG PO TABS: 40.0000 mg | ORAL_TABLET | Freq: Two times a day (BID) | ORAL | Status: DC

## 2010-07-15 NOTE — Telephone Encounter (Signed)
Refilled meds per fax request.  

## 2010-07-22 ENCOUNTER — Encounter (HOSPITAL_BASED_OUTPATIENT_CLINIC_OR_DEPARTMENT_OTHER): Payer: Medicare Other | Admitting: Hematology & Oncology

## 2010-07-22 ENCOUNTER — Other Ambulatory Visit: Payer: Self-pay | Admitting: Hematology & Oncology

## 2010-07-22 DIAGNOSIS — D45 Polycythemia vera: Secondary | ICD-10-CM

## 2010-07-22 DIAGNOSIS — IMO0001 Reserved for inherently not codable concepts without codable children: Secondary | ICD-10-CM

## 2010-07-22 LAB — CBC WITH DIFFERENTIAL (CANCER CENTER ONLY)
BASO#: 0 10*3/uL (ref 0.0–0.2)
EOS%: 4.1 % (ref 0.0–7.0)
HCT: 24.1 % — ABNORMAL LOW (ref 34.8–46.6)
HGB: 5.7 g/dL — CL (ref 11.6–15.9)
MCH: 13.8 pg — ABNORMAL LOW (ref 26.0–34.0)
MCHC: 23.7 g/dL — ABNORMAL LOW (ref 32.0–36.0)
MONO%: 12.4 % (ref 0.0–13.0)
NEUT#: 2.7 10*3/uL (ref 1.5–6.5)
NEUT%: 65.3 % (ref 39.6–80.0)

## 2010-08-05 ENCOUNTER — Other Ambulatory Visit: Payer: Self-pay | Admitting: Hematology & Oncology

## 2010-08-05 ENCOUNTER — Encounter (HOSPITAL_BASED_OUTPATIENT_CLINIC_OR_DEPARTMENT_OTHER): Payer: Medicare Other | Admitting: Hematology & Oncology

## 2010-08-05 DIAGNOSIS — IMO0001 Reserved for inherently not codable concepts without codable children: Secondary | ICD-10-CM

## 2010-08-05 DIAGNOSIS — D45 Polycythemia vera: Secondary | ICD-10-CM

## 2010-08-05 LAB — CBC WITH DIFFERENTIAL (CANCER CENTER ONLY)
BASO%: 0.2 % (ref 0.0–2.0)
EOS%: 4.4 % (ref 0.0–7.0)
LYMPH%: 19.2 % (ref 14.0–48.0)
MCH: 14 pg — ABNORMAL LOW (ref 26.0–34.0)
MCHC: 23.7 g/dL — ABNORMAL LOW (ref 32.0–36.0)
MCV: 59 fL — ABNORMAL LOW (ref 81–101)
MONO%: 13.5 % — ABNORMAL HIGH (ref 0.0–13.0)
Platelets: 238 10*3/uL (ref 145–400)
RDW: 24.5 % — ABNORMAL HIGH (ref 11.1–15.7)

## 2010-08-05 LAB — TECHNOLOGIST REVIEW CHCC SATELLITE

## 2010-08-06 ENCOUNTER — Encounter (HOSPITAL_BASED_OUTPATIENT_CLINIC_OR_DEPARTMENT_OTHER): Payer: Medicare Other | Admitting: Hematology & Oncology

## 2010-08-06 DIAGNOSIS — D45 Polycythemia vera: Secondary | ICD-10-CM

## 2010-08-11 NOTE — H&P (Signed)
[name], [name]                 ACCOUNT NO.:  [id number]   MEDICAL RECORD NO.:  [id number]          PATIENT TYPE:  INP   LOCATION:  1416                         FACILITY:  [facility]   PHYSICIAN:  Rose Phi. [name], M.D. DATE OF BIRTH:  [birth year]   DATE OF ADMISSION:  04/01/2008  DATE OF DISCHARGE:                              HISTORY & PHYSICAL   REASON FOR ADMISSION:  1. Chest pain.  2. History of coronary artery disease.  3. Hypertension .  4. Hypothyroidism.  5. Polycythemia.Marland Kitchen   HISTORY OF PRESENT ILLNESS:  [name] is a very nice 65 year old white  female with multiple medical problems.  She has a history of coronary  artery disease.  She has multiple cardiac risk factors.  She does have a  history of polycythemia.  She is on a phlebotomy program.  She has been  having more in the way of some chest discomfort.  This has been mostly  noted with the colder weather.  The pain does not radiate.  There is no  associated diaphoresis.  She has had no nausea or vomiting with this.   I felt that we needed just to evaluate this as an inpatient as she has  limited resources and does live quite a distance away.   PAST MEDICAL HISTORY:  As above.   ALLERGIES:  PENICILLIN.   MEDICATIONS:  1. Armour Thyroid 90 mg p.o. daily.  2. K-Dur 40 meq  p.o. daily.  3. Lisinopril 20 mg p.o. daily.  4. Lasix 80 mg p.o. daily.  5. Halotestin 5 mg p.o. daily.  6. Premarin 0.625 mg p.o. daily.  7. OxyContin 50 mg p.o. b.i.d.  8. OxyIR 5-10 mg p.o. every 2 hours as needed.  9. Protonix 40 mg p.o. daily.  10.Xanax 0.5 mg every 6 hours p.r.n.   SOCIAL HISTORY:  Is negative for tobacco or alcohol use.   PHYSICAL EXAM:  This is a well-developed, well-nourished white female in  no obvious distress.  VITAL SIGNS:  Show a temperature of 97.7, pulse 66, respiratory rate 16,  blood pressure 119/55.  Oxygen saturation on room air was 100%.  HEAD/NECK EXAM:  Shows normocephalic, atraumatic skull.  There  are no  ocular or oral lesions.  She has no adenopathy in the neck.  Thyroid is  not palpable.  LUNGS:  Clear bilaterally.  CARDIAC EXAM:  Regular rate and rhythm with normal S1, S2.  No murmurs,  rubs or bruits.  ABDOMINAL EXAM:  Soft with good bowel sounds.  There are no palpable  abdominal masses, no palpable hepatosplenomegaly.  BACK EXAM:  No tenderness of the spine, ribs or hips.  EXTREMITIES:  Show no clubbing, cyanosis or edema.  NEUROLOGICAL EXAM:  No focal neurological deficits.   All of her labs are pending.   IMPRESSION:  [name] is a 65 year old white female with chest pain.  She has a number of other medical issues.  She does have risk factors  for coronary artery disease.  1. We are going to have to go ahead and admit her.  I will go ahead  and check cardiac enzymes on her.  We will see what her      electrocardiogram shows.  We will also get a two-dimensional      echocardiogram on her to make sure that there are no wall motion      abnormalities.  2. I am going to see about maybe starting her on some low-dose      aspirin.      Rose Phi. [name], M.D.  Electronically Signed     PRE/MEDQ  D:  04/01/2008  T:  04/01/2008  Job:  [id number]

## 2010-08-11 NOTE — Discharge Summary (Signed)
[name], [name]                 ACCOUNT NO.:  [id number]   MEDICAL RECORD NO.:  [id number]          PATIENT TYPE:  INP   LOCATION:  1420                         FACILITY:  [facility]   PHYSICIAN:  Rose Phi. [name], M.D. DATE OF BIRTH:  1945/08/11   DATE OF ADMISSION:  09/28/2007  DATE OF DISCHARGE:  09/30/2007                               DISCHARGE SUMMARY   DIAGNOSIS UPON DISCHARGE:  1. Chest pain - non-cardiac in etiology.  2. Hypertension.  3. Fibromyalgia.  4. Hypothyroidism.   CONDITION UPON DISCHARGE:  Stable.   ACTIVITIES:  As tolerated.   DIET:  Low-sodium cardiac diet.   FOLLOW UP:  The patient will continue to follow up with Dr. [name] as  scheduled.   MEDICATIONS UPON DISCHARGE:  1. Armour Thyroid 90 mg p.o. daily.  2. K-Dur 40 mEq p.o. daily.  3. Lisinopril 2.5 mg p.o. daily.  4. Lasix 80 mg p.o. daily.  5. Halotussin  5 mg p.o. daily.  6. Premarin 0.625 mg p.o. daily.  7. OxyContin 50 mg p.o. b.i.d.  8. Protonix 40 mg p.o. daily.  9. Xanax 0.5 mg q.8 hours p.r.n.  10.Phenergan suppositories 12.5 mg __________.  11.Xanax p.r.n.  12.OxyIR 5/10 mg p.o. q.6 hours p.r.n.   HOSPITAL COURSE:  Ms. [name] was admitted because of exertional chest  pain.  She does have a history of hypertension.  She does have cardiac  risk factors.   She was admitted.  Her admission EKG showed normal sinus rhythm.  She  had no obvious EKG evidence of myocardial ischemia.  Her cardiac panel  on admission showed normal CK-MB fraction.  Troponin-I was normal.  We  did a chest x-ray on her.  Chest x-ray was unremarkable.  No infiltrates  were noted.   We did do an echocardiogram on her.  Echocardiogram showed good wall  motion.  There was no regional wall motion abnormalities.  Ejection  fraction was 65%.  She had a mildly dilated left atrium.   She continued to do well with observation for the next day.  All of her  vital signs were pretty stable.  Oxygen saturations were fine.   Her  cardiac enzymes all turned out fine.   We did check her TSH.  This also was normal.  I did do a urinalysis on  her.  Everything looked fine with urinalysis.   She is very sensitive to generic medications.  She got nauseated with  taking generic OxyContin that the hospital provided.  She had a headache  and nausea with this.  She required a Phenergan suppository.   On the morning of the 4th, I felt that she could go home.  Everything  looked fine with respect to her cardiac enzymes.  Her vital signs were  all stable.  Blood pressure was a little on the lower side but Ms. [name]  was asymptomatic.  She also wanted to go home.  I did not feel that this  was going to be a problem.  The lungs were clear to percussion and  auscultation bilaterally.  Her cardiac exam showed  regular rate and  rhythm with a normal S1 and S2.  There were no murmurs, rubs or bruits.  Abdominal exam soft with good bowel sounds.      Rose Phi. [name], M.D.  Electronically Signed     PRE/MEDQ  D:  09/30/2007  T:  09/30/2007  Job:  [id number]

## 2010-08-11 NOTE — Discharge Summary (Signed)
[name], [name]                 ACCOUNT NO.:  [id number]   MEDICAL RECORD NO.:  [id number]          PATIENT TYPE:  INP   LOCATION:  1311                         FACILITY:  [facility]   PHYSICIAN:  Rose Phi. [name], M.D. DATE OF BIRTH:  1945-09-17   DATE OF ADMISSION:  04/03/2007  DATE OF DISCHARGE:  04/05/2007                               DISCHARGE SUMMARY   DISCHARGE DIAGNOSES:  1. Fibromyalgia.  2. Hypertension.  3. Polycythemia vera.  4. Hypothyroidism.   CONDITION ON DISCHARGE:  Stable.   ACTIVITY:  As tolerated.   DIET:  No restrictions.   FOLLOWUP:  The patient will continue to follow with Dr. [name] in the  office for blood work every two weeks and phlebotomies as needed.   DISCHARGE MEDICATIONS:  1. Armour thyroid 90 mg daily.  2. Lasix 80 mg daily.  3. Halotestin 5 mg daily.  4. Premarin 0.625 mg p.o. daily.  5. Lisinopril 2.5 mg daily.  6. OxyContin 50 mg p.o. q.12 hours.  7. Oxy-IR 5-10 mg p.o. q.4 hours p.r.n.  8. Protonix 40 mg p.o. daily.  9. K-Dur 40 mEq p.o. daily.  10.Xanax 0.5 - 1 mg p.o. q.6 hours p.r.n.   HOSPITAL COURSE:  The patient was admitted because of fibromyalgia flare  up.  She just has very low tolerance for pain.  She becomes very anxious  and nervous when she gets these fibromyalgia flare-ups.  She thinks  somehow it is related to polycythemia.   We admitted her.  We basically just watched her for a day or two. We  really did not need to do any kind of blood work on her.  She recently  had blood work done in the office.   There was no indication for any kind of x-rays.  X-rays were done as an  outpatient.   We watched the patient.  She did well.  She settled down.  She was more  functional.  She was able to eat okay.  There was no nausea and  vomiting.  She had no problems with going to the bathroom.   She is ready to be discharged on the morning of January 7.  All of her  vital signs were stable.      Rose Phi. [name], M.D.  Electronically Signed     PRE/MEDQ  D:  04/05/2007  T:  04/05/2007  Job:  [id number]

## 2010-08-11 NOTE — H&P (Signed)
NAMEDEANNIE, [name]                 ACCOUNT NO.:  [id number]   MEDICAL RECORD NO.:  [id number]          PATIENT TYPE:  INP   LOCATION:  1311                         FACILITY:  [facility]   PHYSICIAN:  Rose Phi. [name], M.D. DATE OF BIRTH:  1946/03/13   DATE OF ADMISSION:  04/03/2007  DATE OF DISCHARGE:                              HISTORY & PHYSICAL   DIAGNOSES:  1. Fibromyalgia.  2. Polycythemia.  3. Hypertension.   HISTORY OF PRESENT ILLNESS:  [name] is a 65 year old white female who  I admit every 6 months so she can get her Medicaid card.  We basically  have to admit her for 2 days.  She has been doing okay otherwise.  She  has not had any problems except for some fibromyalgia outbreaks.  She is  on OxyContin for this.   PAST MEDICAL HISTORY:  1. Remarkable for fibromyalgia.  2. Polycythemia.  3. Hypothyroidism.  4. Hypertension.   ALLERGIES:  1. PENICILLIN  2. CALCIUM CHANNEL BLOCKERS.   MEDICATION:  1. Armour thyroid 90 mg daily.  2. Lasix 80 mg daily.  3. Halotestin 5 mg daily.  4. Premarin 0.65 mg daily.  5. Lisinopril 2.5 mg daily.  6. OxyContin 50 mg p.o. q.12h.  7. Oxy IR 5-10 mg p.o. q.4h. p.r.n.  8. Protonix 40 mg p.o. daily.  9. K-Dur 40 mEq p.o. daily.  10.Xanax 0.5 to 1 mg p.o. q.6h. p.r.n.   SOCIAL HISTORY:  Negative for tobacco or alcohol use.   PHYSICAL EXAMINATION:  GENERAL APPEARANCE:  This is a well-developed,  well-nourished white female in no obvious distress.  VITAL SIGNS:  Temperature 98, pulse 60, respiratory rate 20, blood  pressure 114/57.  HEENT:  Normocephalic, atraumatic skull.  There are no ocular or oral  lesions.  NECK:  There are no palpable cervical or supraclavicular lymph nodes.  LUNGS:  Clear bilaterally.  CARDIOVASCULAR:  Heart exam regular rate and rhythm with normal S1, S2.  There are no murmurs, rubs or bruits.  ABDOMEN:  Soft, good bowel sounds.  There is no palpable abdominal mass.  No palpable hepatosplenomegaly.  BACK EXAM:  No tenderness of the spine, ribs or hips.  EXTREMITIES:  No clubbing, cyanosis or edema.  NEUROLOGIC EXAM:  No focal neurological deficits.   IMPRESSION:  [name] is a 65-year white female with multiple health  problems.  She needs to be admitted every six months for two days so she  can get her Medicaid card. Why this is, I have no idea, but this is just  now it is with her.  I think shows lives in [place] and I am unsure how  the [place] rules are in [place].   We will just watch her to two days.  I do anticipate any problems with  her.  She does not need any x-rays.  She does not need any blood work.  The patient must take brand name medications.      Rose Phi. [name], M.D.  Electronically Signed     PRE/MEDQ  D:  04/03/2007  T:  04/03/2007  Job:  [id number] 

## 2010-08-11 NOTE — Discharge Summary (Signed)
NAMESIARAH, DELEO                 ACCOUNT NO.:  [id number]   MEDICAL RECORD NO.:  [id number]          PATIENT TYPE:  INP   LOCATION:  1416                         FACILITY:  [facility]   PHYSICIAN:  Rose Phi. [name], M.D. DATE OF BIRTH:  11/28/1945   DATE OF ADMISSION:  04/01/2008  DATE OF DISCHARGE:  04/03/2008                               DISCHARGE SUMMARY   DISCHARGE DIAGNOSES:  1. Chest pain - noncardiac in etiology.  2. Hypertension.  3. Hyperlipidemia.  4. Polycythemia vera.  5. Hypothyroidism.  6. Fibromyalgia.   CONDITION ON DISCHARGE:  Stable.   ACTIVITY:  As tolerated.   DIET:  Low-sodium, 2 gm cardiac diet.   FOLLOW-UP PLAN:  1. The patient will follow up with Dr. [name] as per the schedule set      up previously.  2. The patient will follow with Dr. Patty Sermons according to his      schedule.   DISCHARGE MEDICATIONS:  1. Armour Thyroid 120 mg daily.  2. K-Dur 40 mEq p.o. b.i.d.  3. Lisinopril 5 mg p.o. daily.  4. Lasix 80 mg p.o. daily.  5. Zebeta 5 mg daily.  6. Premarin 0.625 mg p.o. daily.  7. Halotestin 5 mg p.o. daily.  8. OxyContin 50 mg p.o. b.i.d.  9. OxyIR 5-10 mg p.o. q.6 h. p.r.n.  10.Xanax 0.5 mg p.o. q.6 h. p.r.n.  11.Sublingual nitro as needed.   HOSPITAL COURSE:  Ms. Chim was admitted because of chest pain.  This  was exacerbated by the cold weather.  She does have a history of  fibromyalgia.  We admitted her to a monitored unit.  An EKG showed  normal sinus rhythm.  She did have cardiac enzymes done.  The cardiac  enzymes showed her troponin-I to be 0.02.  Her creatinine kinase was 30.  Her metabolic panel showed sodium 138, potassium 4.1, BUN 3, creatinine  0.6.  I did do a TSH on her which was normal at 0.092.  Chest x-ray was  done which did not show any infiltrate.  There are no fractures.  There  are no obvious masses.   We did put her on monitor.  Her monitor showed a couple of intermittent  PVCs, but no sustained runs.  All of her  vital signs remained stable  during the hospital stay.  She is afebrile.  Her blood pressure remained  fine.  We did do a 2-D echocardiogram on her on April 02, 2008.  Today, the results are pending.  She had a couple episodes of chest  discomfort in the hospital, but these were short lasting and did not  need any nitroglycerin for relief.  She was eating well.  There was no  nausea or vomiting.  She was ambulating without any problems.  There is  no problem with chest pain.  I felt that on the morning of April 03, 2008,  we could let her go home.   PHYSICAL EXAMINATION:  VITAL SIGNS:  On the morning of April 03, 2008,  all of her vital signs were fine.  Her temperature was 97.5,  pulse 72,  respiratory rate 20, blood pressure 116/65.  LUNGS:  Clear bilaterally.  There are no rales, wheezes or rhonchi.  CARDIAC:  Regular rate and rhythm with normal S1-S2.  There were no  murmurs, rubs or bruits.  ABDOMEN:  Soft with good bowel sounds.  There was no palpable abdominal  mass.  No palpable hepatosplenomegaly.  EXTREMITIES:  Show no clubbing, cyanosis or edema.  SKIN:  No rash, ecchymosis or petechiae.      Rose Phi. [name], M.D.  Electronically Signed     PRE/MEDQ  D:  04/03/2008  T:  04/03/2008  Job:  [id number]

## 2010-08-11 NOTE — Discharge Summary (Signed)
[name], [name]                 ACCOUNT NO.:  [id number]   MEDICAL RECORD NO.:  [id number]          PATIENT TYPE:  INP   LOCATION:  1317                         FACILITY:  [facility]   PHYSICIAN:  Rose Phi. [name], M.D. DATE OF BIRTH:  [birth year]   DATE OF ADMISSION:  09/28/2006  DATE OF DISCHARGE:  09/30/2006                               DISCHARGE SUMMARY   DIAGNOSES UPON DISCHARGE:  1. Polycythemia vera.  2. Hypertension.  3. Fibromyalgia.  4. Hypothyroidism.  5. Panic attack.   CONDITION UPON DISCHARGE:  Stable.   ACTIVITIES:  As tolerated.   DIET:  No restrictions.   FOLLOWUP:  Patient will continue to follow up at the [facility] for blood work and phlebotomy.   MEDICATIONS UPON DISCHARGE:  1. Armour Thyroid 90 mg daily.  2. Lasix 80 mg daily.  3. Halotestin 5 mg daily.  4. Premarin 0.625 mg daily.  5. Lisinopril 2.5 mg daily.  6. Lyrica 15 mg b.i.d.  7. OxyContin 50 mg p.o. b.i.d.  8. Potassium 40 mEq daily.  9. Protonix 40 mg daily.  10.Xanax 0.5 to 1 mg p.o. q.6h. p.r.n.  11.Ciprofloxacin 500 mg p.o. b.i.d. x7 days.   HOSPITAL COURSE:  [name] was admitted because of severe anxiety.  She  has a history of panic attacks.  She is followed by [place] for polycythemia.  Unfortunately, she really does not have any other medical doctor.  She  just could not be handled at home.   She came in to the hospital.  She improved quite nicely.  We did find  out she had a urinary tract infection.  The urine was quite turbid with  many leukocytes and bacteria.  We went ahead and put her on Cipro.   She improved well.  She did have a little bit of a temperature.   Her blood work on admission was okay.  She had relatively normal  electrolytes.  Her BUN was 1, creatinine 0.6, potassium 3.7.  Albumin  was 3.7 with a protein of 6.4.   We were subsequently able to discharge her on the 4th as she was feeling  better.   All of her vital signs were stable upon  discharge.      Rose Phi. [name], M.D.  Electronically Signed     PRE/MEDQ  D:  09/30/2006  T:  09/30/2006  Job:  [id number]

## 2010-08-11 NOTE — H&P (Signed)
[name], [name]                 ACCOUNT NO.:  [id number]   MEDICAL RECORD NO.:  [id number]          PATIENT TYPE:  INP   LOCATION:                               FACILITY:  [facility]   PHYSICIAN:  Rose Phi. [name], M.D. DATE OF BIRTH:  1945/08/04   DATE OF ADMISSION:  09/28/2007  DATE OF DISCHARGE:                              HISTORY & PHYSICAL   REASON FOR ADMISSION:  1. Chest pain.  2. History of coronary artery disease.  3. Hypertension.  4. Polycythemia vera.   HISTORY OF PRESENT ILLNESS:  [name] is a 62-year white female with  multiple medical problems.  I see her for polycythemia vera.  We have  her on a phlebotomy program.  She does have a history of some coronary  artery disease.  She does have hypertension.  She also has fibromyalgia.  She stated today that she again has some chest pain.  This was noted  with some exertion.  It was nonradiating.  There were some associated  with nausea with this.  There is no emesis. She had a little  diaphoresis.  Given her somewhat tenuous state, I felt that we needed to  admit her to a monitored bed and to make sure that she has not had a  myocardial infarction.   PAST MEDICAL HISTORY:  See old records.   ALLERGIES:  Penicillin.   MEDICATIONS:  1. Armour Thyroid 90 mg p.o. daily.  2. K-Dur 40 mEq p.o. daily.  3. Lisinopril 2.5 mg p.o. daily.  4. Lasix 80 mg p.o. daily.  5. Halotussin  5 mg p.o. daily.  6. Premarin 0.65 mg p.o. daily.  7. OxyContin 50 mg p.o. b.i.d.  8. OxyIR 5/10 mg p.o. q.6 h p.r.n.  9. Protonix 40 mg p.o. daily.  10.Xanax 0.5 mg p.o. q.6 h p.r.n.   SOCIAL HISTORY:  There is a past history of tobacco use.  There is no  alcohol use.   REVIEW OF SYSTEMS:  As stated in the history of present illness.  There  has been no bleeding.  There has been no diarrhea or constipation.  She  has had no pleuritic-type pain.  There has been no obvious reflux.  She  has had no cough.   PHYSICAL EXAMINATION:  GENERAL:   This is a somewhat slightly pale-  appearing white female in no obvious distress.  She is alert and  oriented. X3.  VITAL SIGNS:  Temperature 98.7, pulse 70, respiratory 18, blood pressure  140/76.  HEENT:  Normocephalic, atraumatic skull.  Her conjunctivae are pale.  She has no oral lesions.  No adenopathy in her neck.  LUNGS:  Clear bilaterally.  CARDIAC:  Regular rate and rhythm with normal S1-S2.  No murmurs, rubs  or bruits.  ABDOMEN:  Soft with good bowel sounds.  There is no palpable abdominal  mass.  No palpable hepatosplenomegaly.  BACK:  No tenderness of the spine, ribs or hips.  EXTREMITIES:  Shows no clubbing, cyanosis or edema.  NEUROLOGICAL:  No focal neurological deficits.   LABORATORY DATA:  Labs are pending.  IMPRESSION:  [name] is a 62-year white female with polycythemia vera.  She now has some chest pain.  Given her other medical problems, it is  hard to say what this might be.  It certainly could be reflux.  May be  fibromyalgia.Marland Kitchen  However, I feel that she needs to be admitted to a  monitored bed.  I think we need to rule out myocardial ischemia.  We  will get cardiac enzymes on her.  We will do an EKG on her.  I will also  check a chest x-ray.   Will monitor her.  Will see check her enzymes every 8 hours for three  tests.  Also, check her TSH to make sure she does not have an imbalance  with her thyroid.  Will Also check her metabolic panel.      Rose Phi. [name], M.D.  Electronically Signed     PRE/MEDQ  D:  09/28/2007  T:  09/28/2007  Job:  [id number]

## 2010-08-11 NOTE — H&P (Signed)
[name], [name]                 ACCOUNT NO.:  [id number]   MEDICAL RECORD NO.:  [id number]          PATIENT TYPE:  INP   LOCATION:  1317                         FACILITY:  [facility]   PHYSICIAN:  Rose Phi. [name], M.D. DATE OF BIRTH:  09-Oct-1945   DATE OF ADMISSION:  09/28/2006  DATE OF DISCHARGE:                              HISTORY & PHYSICAL   REASON FOR ADMISSION:  1. Panic attacks.  2. Polycythemia.  3. Hypothyroidism.  4. Hypertension.  5. Fibromyalgia.   HISTORY OF PRESENT ILLNESS:  [name] is a 65 year old female with  polycythemia vera.  She gets phlebotomies.  She has a very unusual  variant in which she is very hyperviscous.  Her normal hemoglobin is  below 7.   She comes in with these acute anxiety episodes.  It is hard for her to  take care of her home because of her poor health.   It is felt that she needs to be admitted for a day or so just so that we  can monitor her and to get her back to her normal baseline.  There has  been no fever.  She does state some slight dysuria.  There has been no  nausea and vomiting.  She has had no bleeding.   PAST MEDICAL HISTORY:  See old records.   ALLERGIES:  ALLERGIES TO PENICILLIN.   MEDICATIONS:  Armour thyroid, OxyContin, Zestril, potassium, Lasix,  Xanax, Protonix, Halotestin, Premarin, Lyrica.   SOCIAL HISTORY:  Negative for tobacco or alcohol use.   REVIEW OF SYSTEMS:  As stated previously.   PHYSICAL EXAMINATION:  GENERAL:  A well-developed, well-nourished white  female in no obvious distress.  VITAL SIGNS:  97.1,  pulse 65, respiratory rate 18, blood pressure  100/65.  HEENT:  Head and neck exam shows no ocular or oral lesions.  No scleral  icterus is noted.  No adenopathy noted in neck.  LUNGS:  Clear bilaterally.  CARDIAC EXAM:  Regular rate and rhythm, with no murmurs, rubs, or  bruits.  ABDOMEN:  Exam soft, with good bowel sounds.  There is no  palpable abdominal mass.  No palpable hepatosplenomegaly.  BACK EXAM:  No tenderness of the spine, ribs, or hips.  EXTREMITIES:  Some tenderness over the muscles and long bones from her  fibromyalgia.    Her labs are pending.   IMPRESSION:  [name] is a 65 year old female with polycythemia.  She  now has some problems with anxiety and panic attacks.  She will be  admitted.  We will just monitor her.  We will check some labs on her.  We will do urine culture.      Rose Phi. [name], M.D.  Electronically Signed     PRE/MEDQ  D:  09/30/2006  T:  09/30/2006  Job:  [id number]

## 2010-08-13 ENCOUNTER — Other Ambulatory Visit: Payer: Self-pay | Admitting: Cardiology

## 2010-08-13 DIAGNOSIS — I10 Essential (primary) hypertension: Secondary | ICD-10-CM

## 2010-08-14 NOTE — Telephone Encounter (Signed)
escribe request  

## 2010-08-19 ENCOUNTER — Encounter (HOSPITAL_BASED_OUTPATIENT_CLINIC_OR_DEPARTMENT_OTHER): Payer: Medicare Other | Admitting: Hematology & Oncology

## 2010-08-19 ENCOUNTER — Other Ambulatory Visit: Payer: Self-pay | Admitting: Hematology & Oncology

## 2010-08-19 DIAGNOSIS — D45 Polycythemia vera: Secondary | ICD-10-CM

## 2010-08-19 DIAGNOSIS — IMO0001 Reserved for inherently not codable concepts without codable children: Secondary | ICD-10-CM

## 2010-08-19 LAB — CBC WITH DIFFERENTIAL (CANCER CENTER ONLY)
BASO%: 0.2 % (ref 0.0–2.0)
EOS%: 4.4 % (ref 0.0–7.0)
HCT: 24.4 % — ABNORMAL LOW (ref 34.8–46.6)
LYMPH#: 0.8 10*3/uL — ABNORMAL LOW (ref 0.9–3.3)
MCHC: 24.6 g/dL — ABNORMAL LOW (ref 32.0–36.0)
NEUT#: 2.9 10*3/uL (ref 1.5–6.5)
NEUT%: 64.1 % (ref 39.6–80.0)
RDW: 24.9 % — ABNORMAL HIGH (ref 11.1–15.7)

## 2010-09-02 ENCOUNTER — Other Ambulatory Visit: Payer: Self-pay | Admitting: Hematology & Oncology

## 2010-09-02 ENCOUNTER — Encounter (HOSPITAL_BASED_OUTPATIENT_CLINIC_OR_DEPARTMENT_OTHER): Payer: Medicare Other | Admitting: Hematology & Oncology

## 2010-09-02 DIAGNOSIS — D45 Polycythemia vera: Secondary | ICD-10-CM

## 2010-09-02 DIAGNOSIS — IMO0001 Reserved for inherently not codable concepts without codable children: Secondary | ICD-10-CM

## 2010-09-02 LAB — CBC WITH DIFFERENTIAL (CANCER CENTER ONLY)
BASO%: 0.2 % (ref 0.0–2.0)
EOS%: 3.3 % (ref 0.0–7.0)
Eosinophils Absolute: 0.2 10*3/uL (ref 0.0–0.5)
MCH: 14.2 pg — ABNORMAL LOW (ref 26.0–34.0)
MONO%: 12 % (ref 0.0–13.0)
NEUT#: 3.3 10*3/uL (ref 1.5–6.5)
Platelets: 240 10*3/uL (ref 145–400)
RBC: 4.45 10*6/uL (ref 3.70–5.32)

## 2010-09-02 LAB — TECHNOLOGIST REVIEW CHCC SATELLITE

## 2010-09-02 LAB — RETICULOCYTES (CHCC): Retic Ct Pct: 1.1 % (ref 0.4–3.1)

## 2010-09-02 LAB — FERRITIN: Ferritin: 2 ng/mL — ABNORMAL LOW (ref 10–291)

## 2010-09-09 ENCOUNTER — Other Ambulatory Visit: Payer: Self-pay | Admitting: Cardiology

## 2010-09-09 DIAGNOSIS — E039 Hypothyroidism, unspecified: Secondary | ICD-10-CM

## 2010-09-09 NOTE — Telephone Encounter (Signed)
escribe request  

## 2010-09-11 ENCOUNTER — Other Ambulatory Visit: Payer: Self-pay | Admitting: Cardiology

## 2010-09-11 NOTE — Telephone Encounter (Signed)
escribe request  

## 2010-09-16 ENCOUNTER — Encounter: Payer: Medicare Other | Admitting: Hematology & Oncology

## 2010-09-16 ENCOUNTER — Other Ambulatory Visit: Payer: Self-pay | Admitting: Hematology & Oncology

## 2010-09-21 ENCOUNTER — Encounter: Payer: Self-pay | Admitting: *Deleted

## 2010-09-25 ENCOUNTER — Other Ambulatory Visit: Payer: Self-pay | Admitting: *Deleted

## 2010-09-25 DIAGNOSIS — E785 Hyperlipidemia, unspecified: Secondary | ICD-10-CM

## 2010-09-28 ENCOUNTER — Ambulatory Visit (INDEPENDENT_AMBULATORY_CARE_PROVIDER_SITE_OTHER): Payer: Medicare Other | Admitting: Cardiology

## 2010-09-28 ENCOUNTER — Encounter: Payer: Self-pay | Admitting: Cardiology

## 2010-09-28 ENCOUNTER — Other Ambulatory Visit (INDEPENDENT_AMBULATORY_CARE_PROVIDER_SITE_OTHER): Payer: Medicare Other | Admitting: *Deleted

## 2010-09-28 ENCOUNTER — Other Ambulatory Visit: Payer: Self-pay | Admitting: Hematology & Oncology

## 2010-09-28 ENCOUNTER — Encounter: Payer: Medicare Other | Admitting: Hematology & Oncology

## 2010-09-28 VITALS — BP 140/72 | HR 68 | Wt 169.0 lb

## 2010-09-28 DIAGNOSIS — R0789 Other chest pain: Secondary | ICD-10-CM

## 2010-09-28 DIAGNOSIS — D45 Polycythemia vera: Secondary | ICD-10-CM

## 2010-09-28 DIAGNOSIS — I119 Hypertensive heart disease without heart failure: Secondary | ICD-10-CM

## 2010-09-28 DIAGNOSIS — M797 Fibromyalgia: Secondary | ICD-10-CM | POA: Insufficient documentation

## 2010-09-28 DIAGNOSIS — E038 Other specified hypothyroidism: Secondary | ICD-10-CM | POA: Insufficient documentation

## 2010-09-28 DIAGNOSIS — Z79899 Other long term (current) drug therapy: Secondary | ICD-10-CM

## 2010-09-28 DIAGNOSIS — R5381 Other malaise: Secondary | ICD-10-CM

## 2010-09-28 DIAGNOSIS — I10 Essential (primary) hypertension: Secondary | ICD-10-CM

## 2010-09-28 DIAGNOSIS — R5383 Other fatigue: Secondary | ICD-10-CM

## 2010-09-28 DIAGNOSIS — I358 Other nonrheumatic aortic valve disorders: Secondary | ICD-10-CM

## 2010-09-28 DIAGNOSIS — Z780 Asymptomatic menopausal state: Secondary | ICD-10-CM | POA: Insufficient documentation

## 2010-09-28 DIAGNOSIS — E039 Hypothyroidism, unspecified: Secondary | ICD-10-CM

## 2010-09-28 DIAGNOSIS — I359 Nonrheumatic aortic valve disorder, unspecified: Secondary | ICD-10-CM

## 2010-09-28 DIAGNOSIS — E785 Hyperlipidemia, unspecified: Secondary | ICD-10-CM

## 2010-09-28 LAB — T4, FREE: Free T4: 0.56 ng/dL — ABNORMAL LOW (ref 0.60–1.60)

## 2010-09-28 LAB — HEPATIC FUNCTION PANEL
Albumin: 4.2 g/dL (ref 3.5–5.2)
Bilirubin, Direct: 0 mg/dL (ref 0.0–0.3)
Total Protein: 6.6 g/dL (ref 6.0–8.3)

## 2010-09-28 LAB — LIPID PANEL
HDL: 28.4 mg/dL — ABNORMAL LOW (ref >39.00)
Total CHOL/HDL Ratio: 4
Triglycerides: 144 mg/dL (ref 0.0–149.0)
VLDL: 28.8 mg/dL (ref 0.0–40.0)

## 2010-09-28 LAB — BASIC METABOLIC PANEL
CO2: 29 mEq/L (ref 19–32)
Calcium: 8.6 mg/dL (ref 8.4–10.5)
Creatinine, Ser: 0.6 mg/dL (ref 0.4–1.2)
GFR: 100.15 mL/min (ref >60.00)
Sodium: 136 mEq/L (ref 135–145)

## 2010-09-28 LAB — HEMOGLOBIN AND HEMATOCRIT, BLOOD: HCT: 25.4 % — ABNORMAL LOW (ref 34.8–46.6)

## 2010-09-28 LAB — TSH: TSH: 0.11 u[IU]/mL — ABNORMAL LOW (ref 0.35–5.50)

## 2010-09-28 MED ORDER — BISOPROLOL FUMARATE 10 MG PO TABS: 10.0000 mg | ORAL_TABLET | Freq: Every day | ORAL | Status: DC

## 2010-09-28 MED ORDER — NITROGLYCERIN 0.4 MG SL SUBL: 0.4000 mg | SUBLINGUAL_TABLET | SUBLINGUAL | Status: DC | PRN

## 2010-09-28 NOTE — Progress Notes (Signed)
[name] Date of Birth:  05-22-1945 [facility] / [facility] [address]   [address] [place], [place]  [address] [phone]           Fax   [phone]  History of Present Illness: This pleasant 65 year old Caucasian female is seen for a scheduled followup office visit.  She has a history of hypothyroidism and a history of polycythemia vera.  Her last phlebotomy was about 4 weeks ago.  She tries to keep her hemoglobin under 6 if possible.  She has not been expressing any recent cardiovascular symptoms such as increased dyspnea or chest pain.  She does have occasional substernal chest pain which may be coronary artery spasm and she does have sublingual nitroglycerin on hand for p.r.n. Use.  She does not have a history of known ischemic heart disease.  She had an echocardiogram in 12/04/09 which showed normal left ventricular systolic function with an ejection fraction of 55-60% and normal diastolic function and mild aortic sclerosis.  Current Outpatient Prescriptions  Medication Sig Dispense Refill  . albuterol-ipratropium (COMBIVENT) 18-103 MCG/ACT inhaler Inhale 2 puffs into the lungs as needed.        . ALPRAZolam (XANAX) 0.5 MG tablet Take 0.5 mg by mouth as needed.        Mack Guise THYROID 60 MG tablet TAKE 2 TABLETS EVERY DAY  60 tablet  12  . aspirin 325 MG tablet Take 325 mg by mouth daily.        . bisoprolol (ZEBETA) 10 MG tablet Take 1 tablet (10 mg total) by mouth daily.  30 tablet  12  . cholecalciferol (VITAMIN D) 1000 UNITS tablet Take 1,000 Units by mouth daily.        . ciprofloxacin (CIPRO) 500 MG tablet TAKE 1 TABLET BY MOUTH TWICE A DAY FOR 14 DAYS  28 tablet  2  . clopidogrel (PLAVIX) 75 MG tablet Take 75 mg by mouth daily.        . cyclobenzaprine (FLEXERIL) 10 MG tablet Take 10 mg by mouth as needed.        . diphenhydrAMINE (BENADRYL) 25 MG tablet Take 25 mg by mouth as needed.        Marland Kitchen DOCUSATE SODIUM PO Take 1 tablet by mouth at bedtime.          Marland Kitchen estrogens, conjugated, (PREMARIN) 0.625 MG tablet Take 0.625 mg by mouth 2 (two) times daily.        . famotidine (PEPCID) 20 MG tablet Take 1 tablet (20 mg total) by mouth 2 (two) times daily.  60 tablet  11  . fexofenadine (ALLEGRA) 180 MG tablet Take 180 mg by mouth daily.        . fish oil-omega-3 fatty acids 1000 MG capsule Take 1 g by mouth daily.        . fluoxymesterone (ANDROXY) 10 MG tablet Take by mouth daily. Taking 1/2 daily       . furosemide (LASIX) 40 MG tablet Take 1 tablet (40 mg total) by mouth 2 (two) times daily.  60 tablet  11  . Garlic Oil 1000 MG CAPS Take by mouth 1 dose over 24 hours.        . hydrochlorothiazide (,MICROZIDE/HYDRODIURIL,) 12.5 MG capsule Take 12.5 mg by mouth daily.        Marland Kitchen lisinopril (PRINIVIL,ZESTRIL) 10 MG tablet Take 1 tablet (10 mg total) by mouth daily.  90 tablet  3  . magnesium oxide (MAG-OX) 400 MG tablet  Take 1,200 mg by mouth daily.        Marland Kitchen oxycodone (OXY-IR) 5 MG capsule Take 5 mg by mouth as needed.        . potassium chloride (KLOR-CON) 10 MEQ CR tablet Take 80 mEq by mouth daily.        . promethazine (PHENERGAN) 25 MG tablet Take 25 mg by mouth as needed.        . Sennosides (SENNA LAX PO) Take 1 tablet by mouth at bedtime.        . temazepam (RESTORIL) 30 MG capsule Take 30 mg by mouth at bedtime as needed.        Marland Kitchen DISCONTD: ZEBETA 10 MG tablet TAKE 1 TABLET BY MOUTH EVERY DAY  30 tablet  12  . nitroGLYCERIN (NITROSTAT) 0.4 MG SL tablet Place 1 tablet (0.4 mg total) under the tongue every 5 (five) minutes as needed for chest pain.  100 tablet  3  . DISCONTD: cetirizine (ZYRTEC) 10 MG tablet Take 10 mg by mouth daily.        Marland Kitchen DISCONTD: fluoxymesterone (HALOTESTIN) 10 MG tablet Take 5 mg by mouth daily.          Allergies  Allergen Reactions  . Calcium Channel Blockers   . Codeine   . Penicillins   . Synthroid (Levothyroxine Sodium)     Patient Active Problem List  Diagnoses  . Benign hypertensive heart disease without  heart failure  . Aortic valve sclerosis  . Hypothyroidism  . Polycythemia vera  . Postmenopausal state  . Fibromyalgia    History  Smoking status  . Never Smoker   Smokeless tobacco  . Not on file    History  Alcohol Use No    Family History  Problem Relation Age of Onset  . Heart attack Father   . Coronary artery disease Mother     had aortic valve replacement    Review of Systems: Constitutional: no fever chills diaphoresis or fatigue or change in weight.  Head and neck: no hearing loss, no epistaxis, no photophobia or visual disturbance. Respiratory: No cough, shortness of breath or wheezing. Cardiovascular: No chest pain peripheral edema, palpitations. Gastrointestinal: No abdominal distention, no abdominal pain, no change in bowel habits hematochezia or melena. Genitourinary: No dysuria, no frequency, no urgency, no nocturia. Musculoskeletal:No arthralgias, no back pain, no gait disturbance or myalgias. Neurological: No dizziness, no headaches, no numbness, no seizures, no syncope, no weakness, no tremors. Hematologic: No lymphadenopathy, no easy bruising. Psychiatric: No confusion, no hallucinations, no sleep disturbance.    Physical Exam: Filed Vitals:   09/28/10 1101  BP: 140/72  Pulse: 68  This is a very pale-appearing middle-age woman in no acute distress.Pupils equal and reactive.   Extraocular Movements are full.  There is no scleral icterus.  The mouth and pharynx are normal.  The neck is supple.  The carotids reveal no bruits.  The jugular venous pressure is normal.  The thyroid is not enlarged.  There is no lymphadenopathy.The chest is clear to percussion and auscultation. There are no rales or rhonchi. Expansion of the chest is symmetrical.  Heart reveals a soft systolic ejection murmur at the base.  The breasts reveal no masses.The abdomen is soft and nontender. Bowel sounds are normal. The liver and spleen are not enlarged. There Are no abdominal masses.  There are no bruits.  Normal extremity without phlebitis or edema.Strength is normal and symmetrical in all extremities.  There is no lateralizing weakness.  There are no sensory deficits.The skin is warm and dry.  There is no rash.  There is marked pallor which is chronic for her   Assessment / Plan: Continue same medication.  We gave her a new prescription for sublingual nitroglycerin as well as a new prescription for her EpiPen which she carries with her at home she'll return at 6 month intervals and we'll repeat any lipid panel and hepatic function panel and basal metabolic panel in 6 months

## 2010-09-28 NOTE — Assessment & Plan Note (Signed)
The patient has a past history of essential hypertension.  This has been aggravated by her polycythemia.  She has not been expressing any increased shortness of breath or palpitations.  Her energy level is fair.

## 2010-09-28 NOTE — Assessment & Plan Note (Signed)
She has a past history of a basilar systolic murmur which may be due in part to her severe anemia and her echocardiogram also shows aortic valve sclerosis.  She has not been expressing any symptoms of congestive heart failure or exertional dizziness or syncope.

## 2010-09-28 NOTE — Assessment & Plan Note (Signed)
The patient has a history of hypothyroidism.  She does not do well with Synthroid but does do well with Armour Thyroid

## 2010-09-28 NOTE — Patient Instructions (Signed)
We gave you an Epi-Pen today

## 2010-09-28 NOTE — Assessment & Plan Note (Signed)
The patient is followed closely for her polycythemia vera by Dr. Rexene Edison.She undergoes frequent phlebotomies to keep her hemoglobin Less than 6 g if possible

## 2010-10-01 ENCOUNTER — Encounter (HOSPITAL_BASED_OUTPATIENT_CLINIC_OR_DEPARTMENT_OTHER): Payer: Medicare Other | Admitting: Hematology & Oncology

## 2010-10-01 DIAGNOSIS — D45 Polycythemia vera: Secondary | ICD-10-CM

## 2010-10-01 NOTE — Progress Notes (Signed)
No answer and no machine to leave a message.

## 2010-10-02 ENCOUNTER — Telehealth: Payer: Self-pay | Admitting: *Deleted

## 2010-10-02 NOTE — Telephone Encounter (Signed)
Message copied by Burnell Blanks on Fri Oct 02, 2010  1:49 PM ------      Message from: [name]      Created: Mon Sep 28, 2010 10:18 PM       Please report.Kidney and liver tests are satisfactory.  The lipids are satisfactory except for the low HDL of 28.The TSH is low. Increase Armour Synthroid to 120 mg daily except on 180 mg on Mondays and Thursdays.

## 2010-10-02 NOTE — Progress Notes (Signed)
Advised of labs 

## 2010-10-02 NOTE — Telephone Encounter (Signed)
Advised of labs 

## 2010-10-09 ENCOUNTER — Telehealth: Payer: Self-pay | Admitting: Cardiology

## 2010-10-09 DIAGNOSIS — E039 Hypothyroidism, unspecified: Secondary | ICD-10-CM

## 2010-10-09 NOTE — Telephone Encounter (Signed)
No answer again 

## 2010-10-09 NOTE — Telephone Encounter (Signed)
No answer and patient doesn't have a machine

## 2010-10-09 NOTE — Telephone Encounter (Signed)
Called because Dr. Patty Sermons took a T3 test and her results were low. She wanted Dr. Patty Sermons to please let her have an increase of 1/2 grain of Armour Thyroid every day (cannot take cynthroid) and believes she just needs a refill of the half grains [facility] in [place] [phone]. And woukld like to retest in 6-8 weeks. Would like to do the increase everyday instead of every two weeks. Please call in- she said she would be leaving and would not be able to answer her phone. I have pulled her chart.

## 2010-10-13 MED ORDER — ARMOUR THYROID 60 MG PO TABS: ORAL_TABLET | ORAL | Status: DC

## 2010-10-13 NOTE — Telephone Encounter (Signed)
advised

## 2010-10-13 NOTE — Telephone Encounter (Signed)
Okay to increase dose of Armour Thyroid to 180 mg daily for one month and then recheck TSH free T4 and free T3

## 2010-10-13 NOTE — Telephone Encounter (Signed)
States she just feels bad and she can hardly move.  Would like to increase her dose to daily instead of just 2 x a week and recheck in 1 month.  Advised had discussed with  Dr. Patty Sermons and he wanted her to continue same dose.  Asked me to please discuss again with  Dr. Patty Sermons again.  Please advise

## 2010-10-14 ENCOUNTER — Encounter: Payer: Medicare Other | Admitting: Hematology & Oncology

## 2010-10-14 ENCOUNTER — Other Ambulatory Visit: Payer: Self-pay | Admitting: Hematology & Oncology

## 2010-10-28 ENCOUNTER — Encounter: Payer: Medicare Other | Admitting: Hematology & Oncology

## 2010-10-28 ENCOUNTER — Other Ambulatory Visit: Payer: Self-pay | Admitting: Hematology & Oncology

## 2010-11-05 ENCOUNTER — Other Ambulatory Visit: Payer: Self-pay | Admitting: *Deleted

## 2010-11-05 ENCOUNTER — Encounter (HOSPITAL_BASED_OUTPATIENT_CLINIC_OR_DEPARTMENT_OTHER): Payer: Medicare Other | Admitting: Hematology & Oncology

## 2010-11-05 DIAGNOSIS — G4700 Insomnia, unspecified: Secondary | ICD-10-CM

## 2010-11-05 DIAGNOSIS — D45 Polycythemia vera: Secondary | ICD-10-CM

## 2010-11-05 MED ORDER — TEMAZEPAM 30 MG PO CAPS: 30.0000 mg | ORAL_CAPSULE | Freq: Every evening | ORAL | Status: DC | PRN

## 2010-11-05 NOTE — Telephone Encounter (Signed)
Faxed signed rx back 

## 2010-11-11 ENCOUNTER — Other Ambulatory Visit: Payer: Self-pay | Admitting: Hematology & Oncology

## 2010-11-11 ENCOUNTER — Other Ambulatory Visit: Payer: Self-pay | Admitting: *Deleted

## 2010-11-11 ENCOUNTER — Encounter (HOSPITAL_BASED_OUTPATIENT_CLINIC_OR_DEPARTMENT_OTHER): Payer: Medicare Other | Admitting: Hematology & Oncology

## 2010-11-11 DIAGNOSIS — IMO0001 Reserved for inherently not codable concepts without codable children: Secondary | ICD-10-CM

## 2010-11-11 DIAGNOSIS — F419 Anxiety disorder, unspecified: Secondary | ICD-10-CM

## 2010-11-11 DIAGNOSIS — D45 Polycythemia vera: Secondary | ICD-10-CM

## 2010-11-11 LAB — CBC WITH DIFFERENTIAL (CANCER CENTER ONLY)
BASO%: 0 % (ref 0.0–2.0)
LYMPH#: 0.6 10*3/uL — ABNORMAL LOW (ref 0.9–3.3)
MONO#: 0.5 10*3/uL (ref 0.1–0.9)
NEUT#: 2.4 10*3/uL (ref 1.5–6.5)
Platelets: 194 10*3/uL (ref 145–400)
RDW: 23.4 % — ABNORMAL HIGH (ref 11.1–15.7)
WBC: 3.6 10*3/uL — ABNORMAL LOW (ref 3.9–10.0)

## 2010-11-11 LAB — TECHNOLOGIST REVIEW CHCC SATELLITE

## 2010-11-11 LAB — FERRITIN: Ferritin: 1 ng/mL — ABNORMAL LOW (ref 10–291)

## 2010-11-11 MED ORDER — ALPRAZOLAM 0.5 MG PO TABS: 0.5000 mg | ORAL_TABLET | Freq: Four times a day (QID) | ORAL | Status: DC | PRN

## 2010-11-11 NOTE — Telephone Encounter (Signed)
Refilled meds per fax request. Faxed signed Rx back 

## 2010-11-12 ENCOUNTER — Telehealth: Payer: Self-pay | Admitting: Cardiology

## 2010-11-12 DIAGNOSIS — E039 Hypothyroidism, unspecified: Secondary | ICD-10-CM

## 2010-11-12 DIAGNOSIS — E785 Hyperlipidemia, unspecified: Secondary | ICD-10-CM

## 2010-11-12 DIAGNOSIS — I119 Hypertensive heart disease without heart failure: Secondary | ICD-10-CM

## 2010-11-12 NOTE — Telephone Encounter (Signed)
Scheduled appointment

## 2010-11-12 NOTE — Telephone Encounter (Signed)
Pt wanted to talk to you and wouldn't tell me why please call back

## 2010-11-25 ENCOUNTER — Encounter (HOSPITAL_BASED_OUTPATIENT_CLINIC_OR_DEPARTMENT_OTHER): Payer: Medicare Other | Admitting: Hematology & Oncology

## 2010-11-25 ENCOUNTER — Other Ambulatory Visit: Payer: Medicare Other | Admitting: *Deleted

## 2010-11-25 ENCOUNTER — Other Ambulatory Visit: Payer: Self-pay | Admitting: Hematology & Oncology

## 2010-11-25 DIAGNOSIS — D649 Anemia, unspecified: Secondary | ICD-10-CM

## 2010-11-25 DIAGNOSIS — D45 Polycythemia vera: Secondary | ICD-10-CM

## 2010-11-25 DIAGNOSIS — IMO0001 Reserved for inherently not codable concepts without codable children: Secondary | ICD-10-CM

## 2010-11-25 LAB — HEMOGLOBIN AND HEMATOCRIT, BLOOD
HCT: 25.3 % — ABNORMAL LOW (ref 34.8–46.6)
HGB: 6.3 g/dL — CL (ref 11.6–15.9)

## 2010-12-09 ENCOUNTER — Other Ambulatory Visit: Payer: Self-pay | Admitting: Hematology & Oncology

## 2010-12-09 ENCOUNTER — Encounter: Payer: Medicare Other | Admitting: Hematology & Oncology

## 2010-12-09 LAB — HEMOGLOBIN AND HEMATOCRIT, BLOOD: HCT: 23.5 % — ABNORMAL LOW (ref 34.8–46.6)

## 2010-12-10 ENCOUNTER — Other Ambulatory Visit: Payer: Self-pay | Admitting: Cardiology

## 2010-12-10 DIAGNOSIS — B999 Unspecified infectious disease: Secondary | ICD-10-CM

## 2010-12-16 LAB — LIPID PANEL
Triglycerides: 120
VLDL: 24

## 2010-12-16 LAB — COMPREHENSIVE METABOLIC PANEL
Alkaline Phosphatase: 54
BUN: 2 — ABNORMAL LOW
Chloride: 100
Glucose, Bld: 86
Potassium: 3.7
Total Bilirubin: 0.6
Total Protein: 6.1

## 2010-12-23 ENCOUNTER — Encounter: Payer: Medicare Other | Admitting: Hematology & Oncology

## 2010-12-23 ENCOUNTER — Other Ambulatory Visit: Payer: Self-pay | Admitting: Hematology & Oncology

## 2010-12-23 LAB — HEMOGLOBIN AND HEMATOCRIT, BLOOD: HGB: 6.2 g/dL — CL (ref 11.6–15.9)

## 2010-12-24 ENCOUNTER — Encounter (HOSPITAL_BASED_OUTPATIENT_CLINIC_OR_DEPARTMENT_OTHER): Payer: Medicare Other | Admitting: Hematology & Oncology

## 2010-12-24 DIAGNOSIS — D45 Polycythemia vera: Secondary | ICD-10-CM

## 2010-12-24 LAB — CARDIAC PANEL(CRET KIN+CKTOT+MB+TROPI)
CK, MB: 1
CK, MB: 1.1
CK, MB: 1.1
Relative Index: INVALID
Relative Index: INVALID
Total CK: 36
Troponin I: <0.01

## 2010-12-24 LAB — URINALYSIS, ROUTINE W REFLEX MICROSCOPIC
Bilirubin Urine: NEGATIVE
Glucose, UA: NEGATIVE
Hgb urine dipstick: NEGATIVE
Specific Gravity, Urine: 1.006
pH: 6

## 2010-12-24 LAB — URINE CULTURE: Special Requests: NEGATIVE

## 2010-12-24 LAB — BASIC METABOLIC PANEL
Chloride: 101
Creatinine, Ser: 0.57
GFR calc Af Amer: >60
Potassium: 3.9

## 2010-12-24 LAB — TSH: TSH: 1.42 (ref 0.350–4.500)

## 2010-12-24 LAB — URINE MICROSCOPIC-ADD ON

## 2011-01-06 ENCOUNTER — Other Ambulatory Visit: Payer: Self-pay | Admitting: Cardiology

## 2011-01-06 ENCOUNTER — Other Ambulatory Visit: Payer: Self-pay | Admitting: Hematology & Oncology

## 2011-01-06 ENCOUNTER — Encounter (HOSPITAL_BASED_OUTPATIENT_CLINIC_OR_DEPARTMENT_OTHER): Payer: Medicare Other | Admitting: Hematology & Oncology

## 2011-01-06 DIAGNOSIS — D45 Polycythemia vera: Secondary | ICD-10-CM

## 2011-01-06 DIAGNOSIS — IMO0001 Reserved for inherently not codable concepts without codable children: Secondary | ICD-10-CM

## 2011-01-06 LAB — CBC WITH DIFFERENTIAL (CANCER CENTER ONLY)
BASO#: 0 10*3/uL (ref 0.0–0.2)
BASO%: 0.4 % (ref 0.0–2.0)
EOS%: 3.3 % (ref 0.0–7.0)
HGB: 6.2 g/dL — CL (ref 11.6–15.9)
LYMPH#: 0.8 10*3/uL — ABNORMAL LOW (ref 0.9–3.3)
MCHC: 25.1 g/dL — ABNORMAL LOW (ref 32.0–36.0)
NEUT#: 3.8 10*3/uL (ref 1.5–6.5)
Platelets: 198 10*3/uL (ref 145–400)

## 2011-01-06 LAB — COMPREHENSIVE METABOLIC PANEL
AST: 14 U/L (ref 0–37)
Alkaline Phosphatase: 66 U/L (ref 39–117)
BUN: 5 mg/dL — ABNORMAL LOW (ref 6–23)
Creatinine, Ser: 0.67 mg/dL (ref 0.50–1.10)
Glucose, Bld: 89 mg/dL (ref 70–99)
Potassium: 3.5 mEq/L (ref 3.5–5.3)
Total Bilirubin: 0.5 mg/dL (ref 0.3–1.2)

## 2011-01-06 LAB — RETICULOCYTES (CHCC)
RBC.: 4.19 MIL/uL (ref 3.87–5.11)
Retic Ct Pct: 1.7 % (ref 0.4–2.3)

## 2011-01-06 LAB — FERRITIN: Ferritin: 2 ng/mL — ABNORMAL LOW (ref 10–291)

## 2011-01-06 LAB — CHCC SATELLITE - SMEAR

## 2011-01-11 ENCOUNTER — Encounter (HOSPITAL_BASED_OUTPATIENT_CLINIC_OR_DEPARTMENT_OTHER): Payer: Medicare Other | Admitting: Hematology & Oncology

## 2011-01-11 DIAGNOSIS — IMO0001 Reserved for inherently not codable concepts without codable children: Secondary | ICD-10-CM

## 2011-01-11 DIAGNOSIS — D45 Polycythemia vera: Secondary | ICD-10-CM

## 2011-01-12 LAB — URINALYSIS, ROUTINE W REFLEX MICROSCOPIC
Glucose, UA: NEGATIVE
Nitrite: NEGATIVE
Specific Gravity, Urine: 1.015
pH: 8

## 2011-01-12 LAB — COMPREHENSIVE METABOLIC PANEL
Albumin: 3.7
Alkaline Phosphatase: 52
BUN: 1 — ABNORMAL LOW
Creatinine, Ser: 0.68
Glucose, Bld: 114 — ABNORMAL HIGH
Potassium: 3.7
Total Bilirubin: 0.6
Total Protein: 6.4

## 2011-01-12 LAB — URINE CULTURE
Colony Count: 30000
Special Requests: NEGATIVE

## 2011-01-12 LAB — URINE MICROSCOPIC-ADD ON

## 2011-01-19 ENCOUNTER — Other Ambulatory Visit: Payer: Self-pay | Admitting: Cardiology

## 2011-01-19 NOTE — Telephone Encounter (Signed)
pls rtn call wants refill called in today, pt completely out

## 2011-01-20 ENCOUNTER — Other Ambulatory Visit: Payer: Self-pay | Admitting: Hematology & Oncology

## 2011-01-20 ENCOUNTER — Encounter (HOSPITAL_BASED_OUTPATIENT_CLINIC_OR_DEPARTMENT_OTHER): Payer: Medicare Other | Admitting: Hematology & Oncology

## 2011-01-20 DIAGNOSIS — IMO0001 Reserved for inherently not codable concepts without codable children: Secondary | ICD-10-CM

## 2011-01-20 DIAGNOSIS — D45 Polycythemia vera: Secondary | ICD-10-CM

## 2011-01-20 LAB — CBC WITH DIFFERENTIAL (CANCER CENTER ONLY)
BASO%: 0.7 % (ref 0.0–2.0)
EOS%: 2.2 % (ref 0.0–7.0)
HCT: 23.1 % — ABNORMAL LOW (ref 34.8–46.6)
LYMPH%: 17.5 % (ref 14.0–48.0)
MCHC: 24.7 g/dL — ABNORMAL LOW (ref 32.0–36.0)
MCV: 62 fL — ABNORMAL LOW (ref 81–101)
MONO%: 14.9 % — ABNORMAL HIGH (ref 0.0–13.0)
NEUT%: 64.7 % (ref 39.6–80.0)
Platelets: 241 10*3/uL (ref 145–400)
RDW: 23.1 % — ABNORMAL HIGH (ref 11.1–15.7)
WBC: 4.6 10*3/uL (ref 3.9–10.0)

## 2011-01-20 LAB — TECHNOLOGIST REVIEW CHCC SATELLITE

## 2011-02-03 ENCOUNTER — Other Ambulatory Visit: Payer: Self-pay | Admitting: Hematology & Oncology

## 2011-02-03 ENCOUNTER — Other Ambulatory Visit (HOSPITAL_BASED_OUTPATIENT_CLINIC_OR_DEPARTMENT_OTHER): Payer: Medicare Other | Admitting: Lab

## 2011-02-03 DIAGNOSIS — IMO0001 Reserved for inherently not codable concepts without codable children: Secondary | ICD-10-CM

## 2011-02-03 DIAGNOSIS — D45 Polycythemia vera: Secondary | ICD-10-CM

## 2011-02-03 LAB — CBC WITH DIFFERENTIAL (CANCER CENTER ONLY)
Eosinophils Absolute: 0.1 10*3/uL (ref 0.0–0.5)
LYMPH#: 0.7 10*3/uL — ABNORMAL LOW (ref 0.9–3.3)
MONO#: 0.5 10*3/uL (ref 0.1–0.9)
MONO%: 10.3 % (ref 0.0–13.0)
NEUT#: 3.3 10*3/uL (ref 1.5–6.5)
Platelets: 211 10*3/uL (ref 145–400)
RBC: 4.12 10*6/uL (ref 3.70–5.32)
WBC: 4.7 10*3/uL (ref 3.9–10.0)

## 2011-02-03 LAB — TECHNOLOGIST REVIEW CHCC SATELLITE

## 2011-02-04 ENCOUNTER — Ambulatory Visit: Payer: Medicare Other

## 2011-02-04 VITALS — BP 122/69 | HR 67 | Temp 97.7°F

## 2011-02-04 DIAGNOSIS — D45 Polycythemia vera: Secondary | ICD-10-CM

## 2011-02-04 NOTE — Progress Notes (Signed)
[name] presents today for phlebotomy per MD orders. Phlebotomy procedure started at 1115 and ended at 1130. 500 grams removed. Patient observed for 30 minutes after procedure without any incident. Patient tolerated procedure well. IV needle removed intact.

## 2011-02-12 ENCOUNTER — Telehealth: Payer: Self-pay | Admitting: Cardiology

## 2011-02-12 ENCOUNTER — Other Ambulatory Visit: Payer: Self-pay | Admitting: *Deleted

## 2011-02-12 NOTE — Telephone Encounter (Signed)
New Problem:  Wanted to know if we were prescribing Ms. Lowden any narcotics.

## 2011-02-12 NOTE — Telephone Encounter (Signed)
Dr Gustavo Lah nurse was called.  I wasn't able to find anything about pt receiving any oxycodone from Dr Patty Sermons.

## 2011-02-16 ENCOUNTER — Other Ambulatory Visit: Payer: Self-pay | Admitting: *Deleted

## 2011-02-16 DIAGNOSIS — D45 Polycythemia vera: Secondary | ICD-10-CM

## 2011-02-16 MED ORDER — CLOPIDOGREL BISULFATE 75 MG PO TABS: 75.0000 mg | ORAL_TABLET | Freq: Every day | ORAL | Status: DC

## 2011-02-17 ENCOUNTER — Other Ambulatory Visit: Payer: Self-pay | Admitting: Hematology & Oncology

## 2011-02-17 ENCOUNTER — Other Ambulatory Visit (HOSPITAL_BASED_OUTPATIENT_CLINIC_OR_DEPARTMENT_OTHER): Payer: Medicare Other | Admitting: Lab

## 2011-02-17 DIAGNOSIS — IMO0001 Reserved for inherently not codable concepts without codable children: Secondary | ICD-10-CM

## 2011-02-17 DIAGNOSIS — D45 Polycythemia vera: Secondary | ICD-10-CM

## 2011-02-17 LAB — CBC WITH DIFFERENTIAL (CANCER CENTER ONLY)
BASO#: 0 10*3/uL (ref 0.0–0.2)
EOS%: 3.9 % (ref 0.0–7.0)
Eosinophils Absolute: 0.2 10*3/uL (ref 0.0–0.5)
LYMPH%: 19.1 % (ref 14.0–48.0)
MCH: 15.4 pg — ABNORMAL LOW (ref 26.0–34.0)
MCHC: 24.6 g/dL — ABNORMAL LOW (ref 32.0–36.0)
MCV: 63 fL — ABNORMAL LOW (ref 81–101)
MONO%: 14.9 % — ABNORMAL HIGH (ref 0.0–13.0)
NEUT#: 2.5 10*3/uL (ref 1.5–6.5)
Platelets: 200 10*3/uL (ref 145–400)
RBC: 3.69 10*6/uL — ABNORMAL LOW (ref 3.70–5.32)

## 2011-03-02 ENCOUNTER — Other Ambulatory Visit: Payer: Medicare Other | Admitting: *Deleted

## 2011-03-03 ENCOUNTER — Other Ambulatory Visit (INDEPENDENT_AMBULATORY_CARE_PROVIDER_SITE_OTHER): Payer: Medicare Other | Admitting: *Deleted

## 2011-03-03 ENCOUNTER — Telehealth: Payer: Self-pay | Admitting: *Deleted

## 2011-03-03 ENCOUNTER — Other Ambulatory Visit: Payer: Self-pay | Admitting: Hematology & Oncology

## 2011-03-03 ENCOUNTER — Other Ambulatory Visit (HOSPITAL_BASED_OUTPATIENT_CLINIC_OR_DEPARTMENT_OTHER): Payer: Medicare Other | Admitting: Lab

## 2011-03-03 DIAGNOSIS — D45 Polycythemia vera: Secondary | ICD-10-CM

## 2011-03-03 DIAGNOSIS — I119 Hypertensive heart disease without heart failure: Secondary | ICD-10-CM

## 2011-03-03 DIAGNOSIS — E039 Hypothyroidism, unspecified: Secondary | ICD-10-CM

## 2011-03-03 DIAGNOSIS — IMO0001 Reserved for inherently not codable concepts without codable children: Secondary | ICD-10-CM

## 2011-03-03 LAB — CBC WITH DIFFERENTIAL (CANCER CENTER ONLY)
BASO%: 0 % (ref 0.0–2.0)
EOS%: 2.3 % (ref 0.0–7.0)
HCT: 24.7 % — ABNORMAL LOW (ref 34.8–46.6)
LYMPH#: 0.6 10*3/uL — ABNORMAL LOW (ref 0.9–3.3)
MCHC: 24.3 g/dL — ABNORMAL LOW (ref 32.0–36.0)
NEUT%: 73.6 % (ref 39.6–80.0)
Platelets: 179 10*3/uL (ref 145–400)
RDW: 22.2 % — ABNORMAL HIGH (ref 11.1–15.7)

## 2011-03-03 LAB — LIPID PANEL
LDL Cholesterol: 80 mg/dL (ref 0–99)
Total CHOL/HDL Ratio: 5
Triglycerides: 93 mg/dL (ref 0.0–149.0)

## 2011-03-03 LAB — CBC WITH DIFFERENTIAL/PLATELET
Basophils Absolute: 0 10*3/uL (ref 0.0–0.1)
Eosinophils Absolute: 0.1 10*3/uL (ref 0.0–0.7)
HCT: 23.3 % — CL (ref 36.0–46.0)
Hemoglobin: 6.4 g/dL — CL (ref 12.0–15.0)
Lymphs Abs: 0.5 10*3/uL — ABNORMAL LOW (ref 0.7–4.0)
MCHC: 27.7 g/dL — ABNORMAL LOW (ref 30.0–36.0)
MCV: 57.1 fl — ABNORMAL LOW (ref 78.0–100.0)
Monocytes Absolute: 0.4 10*3/uL (ref 0.1–1.0)
Monocytes Relative: 9 % (ref 3.0–12.0)
Neutro Abs: 3.6 10*3/uL (ref 1.4–7.7)
Platelets: 106 10*3/uL — ABNORMAL LOW (ref 150.0–400.0)
RDW: 22.6 % — ABNORMAL HIGH (ref 11.5–14.6)

## 2011-03-03 LAB — BASIC METABOLIC PANEL
BUN: 7 mg/dL (ref 6–23)
CO2: 29 mEq/L (ref 19–32)
Calcium: 8.6 mg/dL (ref 8.4–10.5)
Chloride: 102 mEq/L (ref 96–112)
Creatinine, Ser: 0.6 mg/dL (ref 0.4–1.2)
Glucose, Bld: 90 mg/dL (ref 70–99)

## 2011-03-03 LAB — HEPATIC FUNCTION PANEL
AST: 14 U/L (ref 0–37)
Alkaline Phosphatase: 66 U/L (ref 39–117)
Bilirubin, Direct: 0.1 mg/dL (ref 0.0–0.3)

## 2011-03-03 NOTE — Telephone Encounter (Signed)
Spoke with husband and patient aware of labs.  Will forward to Dr [name]

## 2011-03-03 NOTE — Telephone Encounter (Signed)
No answer, will go ahead and forward to

## 2011-03-03 NOTE — Telephone Encounter (Signed)
Lab called with critical results, HGB 6.4/ HCT 23.3.

## 2011-03-03 NOTE — Telephone Encounter (Signed)
Low hemoglobin is normal for her.  She has polycythemia and has frequent phlebotomies.

## 2011-03-05 ENCOUNTER — Other Ambulatory Visit: Payer: Self-pay | Admitting: *Deleted

## 2011-03-05 DIAGNOSIS — D45 Polycythemia vera: Secondary | ICD-10-CM

## 2011-03-05 MED ORDER — OXYCODONE HCL 15 MG PO TB12: 15.0000 mg | ORAL_TABLET | Freq: Three times a day (TID) | ORAL | Status: DC

## 2011-03-05 MED ORDER — OXYCODONE HCL 20 MG PO TB12: 20.0000 mg | ORAL_TABLET | Freq: Two times a day (BID) | ORAL | Status: DC

## 2011-03-05 MED ORDER — OXYCODONE HCL 10 MG PO TB12: 10.0000 mg | ORAL_TABLET | Freq: Two times a day (BID) | ORAL | Status: DC

## 2011-03-08 ENCOUNTER — Encounter: Payer: Self-pay | Admitting: Cardiology

## 2011-03-08 ENCOUNTER — Ambulatory Visit (INDEPENDENT_AMBULATORY_CARE_PROVIDER_SITE_OTHER): Payer: Medicare Other | Admitting: Cardiology

## 2011-03-08 VITALS — BP 126/70 | HR 66 | Ht 63.0 in | Wt 166.0 lb

## 2011-03-08 DIAGNOSIS — I358 Other nonrheumatic aortic valve disorders: Secondary | ICD-10-CM

## 2011-03-08 DIAGNOSIS — E039 Hypothyroidism, unspecified: Secondary | ICD-10-CM

## 2011-03-08 DIAGNOSIS — J40 Bronchitis, not specified as acute or chronic: Secondary | ICD-10-CM

## 2011-03-08 DIAGNOSIS — I359 Nonrheumatic aortic valve disorder, unspecified: Secondary | ICD-10-CM

## 2011-03-08 DIAGNOSIS — I119 Hypertensive heart disease without heart failure: Secondary | ICD-10-CM

## 2011-03-08 DIAGNOSIS — D45 Polycythemia vera: Secondary | ICD-10-CM

## 2011-03-08 DIAGNOSIS — J3489 Other specified disorders of nose and nasal sinuses: Secondary | ICD-10-CM

## 2011-03-08 MED ORDER — AZITHROMYCIN 500 MG PO TABS: 500.0000 mg | ORAL_TABLET | Freq: Two times a day (BID) | ORAL | Status: AC

## 2011-03-08 NOTE — Assessment & Plan Note (Signed)
The patient has had some recent worsening and signs and symptoms of early bronchitis.  She is requesting a azithromycin.  He states that the normal dose never helps her but she is helped with a azithromycin 500 mg one twice a day and we sent that into her pharmacy today

## 2011-03-08 NOTE — Assessment & Plan Note (Signed)
She has a long history of polycythemia rubra para.  She does best if her hemoglobin is Below 6.

## 2011-03-08 NOTE — Assessment & Plan Note (Signed)
Patient has not been aware of any recent PVCs.  She has had no syncopal episodes.

## 2011-03-08 NOTE — Progress Notes (Signed)
Stark Klein Date of Birth:  12-28-1945 [facility] / [facility] [address]   [address] [place], [place]  [address] [phone]           Fax   [phone]  History of Present Illness: This pleasant 65 year old Caucasian female is seen for a scheduled followup office visit.  As a history of polycythemia vera and a history of hypothyroidism to his history of PVCs in the past she has hypothyroidism and aortic valve sclerosis and essential hypertension.  She has atypical chest pain and does not have any history of ischemic heart disease.  Current Outpatient Prescriptions  Medication Sig Dispense Refill  . albuterol-ipratropium (COMBIVENT) 18-103 MCG/ACT inhaler Inhale 2 puffs into the lungs as needed.        . ALPRAZolam (XANAX) 0.5 MG tablet Take 1 tablet (0.5 mg total) by mouth every 6 (six) hours as needed.  120 tablet  5  . aspirin 325 MG tablet Take 325 mg by mouth daily.        . bisoprolol (ZEBETA) 10 MG tablet Take 1 tablet (10 mg total) by mouth daily.  30 tablet  12  . cholecalciferol (VITAMIN D) 1000 UNITS tablet Take 1,000 Units by mouth daily.        . ciprofloxacin (CIPRO) 500 MG tablet TAKE 1 TABLET BY MOUTH TWICE A DAY FOR 14 DAYS  28 tablet  2  . clopidogrel (PLAVIX) 75 MG tablet Take 1 tablet (75 mg total) by mouth daily.  30 tablet  3  . cyclobenzaprine (FLEXERIL) 10 MG tablet Take 10 mg by mouth as needed.        . diphenhydrAMINE (BENADRYL) 25 MG tablet Take 25 mg by mouth as needed.        Marland Kitchen DOCUSATE SODIUM PO Take 1 tablet by mouth at bedtime.        Marland Kitchen estrogens, conjugated, (PREMARIN) 0.625 MG tablet Take 0.625 mg by mouth 2 (two) times daily.        . famotidine (PEPCID) 20 MG tablet Take 1 tablet (20 mg total) by mouth 2 (two) times daily.  60 tablet  11  . fexofenadine (ALLEGRA) 180 MG tablet Take 180 mg by mouth daily.        . fish oil-omega-3 fatty acids 1000 MG capsule Take 1 g by mouth daily.        . fluoxymesterone (ANDROXY) 10 MG tablet  Take by mouth daily. Taking 1/2 daily       . furosemide (LASIX) 40 MG tablet Take 1 tablet (40 mg total) by mouth 2 (two) times daily.  60 tablet  11  . Garlic Oil 1000 MG CAPS Take by mouth 1 dose over 24 hours.        . hydrochlorothiazide (MICROZIDE) 12.5 MG capsule TAKE 1 CAPSULE EVERY DAY  30 capsule  6  . lisinopril (PRINIVIL,ZESTRIL) 10 MG tablet Take 1 tablet (10 mg total) by mouth daily.  90 tablet  3  . magnesium oxide (MAG-OX) 400 MG tablet Take 1,200 mg by mouth daily.        . nitroGLYCERIN (NITROSTAT) 0.4 MG SL tablet Place 1 tablet (0.4 mg total) under the tongue every 5 (five) minutes as needed for chest pain.  100 tablet  3  . oxycodone (OXY-IR) 5 MG capsule Take 5 mg by mouth as needed.        Marland Kitchen oxyCODONE (OXYCONTIN) 10 MG 12 hr tablet Take 1 tablet (10 mg total) by mouth every  12 (twelve) hours.  60 tablet  0  . oxyCODONE (OXYCONTIN) 15 MG TB12 Take 1 tablet (15 mg total) by mouth every 8 (eight) hours.  75 tablet  0  . oxyCODONE (OXYCONTIN) 20 MG 12 hr tablet Take 1 tablet (20 mg total) by mouth every 12 (twelve) hours.  90 tablet  0  . potassium chloride (KLOR-CON) 10 MEQ CR tablet Take 80 mEq by mouth daily.        . promethazine (PHENERGAN) 25 MG tablet Take 25 mg by mouth as needed.        . Sennosides (SENNA LAX PO) Take 1 tablet by mouth at bedtime.        . temazepam (RESTORIL) 30 MG capsule Take 1 capsule (30 mg total) by mouth at bedtime as needed.  30 capsule  3  . thyroid (ARMOUR THYROID) 60 MG tablet as directed. 2 and 1/2 daily       . azithromycin (ZITHROMAX) 500 MG tablet Take 1 tablet (500 mg total) by mouth 2 (two) times daily.  12 tablet  1    Allergies  Allergen Reactions  . Calcium Channel Blockers   . Codeine   . Penicillins   . Synthroid (Levothyroxine Sodium)     Patient Active Problem List  Diagnoses  . Benign hypertensive heart disease without heart failure  . Aortic valve sclerosis  . Hypothyroidism  . Polycythemia vera  . Postmenopausal  state  . Fibromyalgia    History  Smoking status  . Never Smoker   Smokeless tobacco  . Not on file    History  Alcohol Use No    Family History  Problem Relation Age of Onset  . Heart attack Father   . Coronary artery disease Mother     had aortic valve replacement    Review of Systems: Constitutional: no fever chills diaphoresis or fatigue or change in weight.  Head and neck: no hearing loss, no epistaxis, no photophobia or visual disturbance. Respiratory: No cough, shortness of breath or wheezing. Cardiovascular: No chest pain peripheral edema, palpitations. Gastrointestinal: No abdominal distention, no abdominal pain, no change in bowel habits hematochezia or melena. Genitourinary: No dysuria, no frequency, no urgency, no nocturia. Musculoskeletal:No arthralgias, no back pain, no gait disturbance or myalgias. Neurological: No dizziness, no headaches, no numbness, no seizures, no syncope, no weakness, no tremors. Hematologic: No lymphadenopathy, no easy bruising. Psychiatric: No confusion, no hallucinations, no sleep disturbance.    Physical Exam: Filed Vitals:   03/08/11 1435  BP: 126/70  Pulse: 66   the general appearance reveals a well-developed well-nourished woman in no distress.Pupils equal and reactive.   Extraocular Movements are full.  There is no scleral icterus.  The mouth and pharynx are normal.  The neck is supple.  The carotids reveal no bruits.  The jugular venous pressure is normal.  The thyroid is not enlarged.  There is no lymphadenopathy.The chest is clear to percussion and auscultation. There are no rales or rhonchi. Expansion of the chest is symmetrical.  When she lies down there is very distant sporadic wheezing audible.The precordium is quiet.  The first heart sound is normal.  The second heart sound is physiologically split.  There is no murmur gallop rub or click.  There is no abnormal lift or heave.  The abdomen is soft and nontender. Bowel  sounds are normal. The liver and spleen are not enlarged. There Are no abdominal masses. There are no bruits.  Extremities no phlebitis or edema.  Integument reveals pallor from severe chronic anemiaStrength is normal and symmetrical in all extremities.  There is no lateralizing weakness.  There are no sensory deficits.       Assessment / Plan: Continue present medication.  Recheck in 6 months for followup office visit and EKG and fasting lab work including thyroid function

## 2011-03-08 NOTE — Assessment & Plan Note (Signed)
No chest pain.  No shortness of breath.  No headaches or dizzy spells.

## 2011-03-08 NOTE — Patient Instructions (Signed)
Your physician recommends that you continue on your current medications as directed. Please refer to the Current Medication list given to you today.  Your physician wants you to follow-up in: 6 months. You will receive a reminder letter in the mail two months in advance. If you don't receive a letter, please call our office to schedule the follow-up appointment.  

## 2011-03-16 ENCOUNTER — Other Ambulatory Visit: Payer: Self-pay | Admitting: *Deleted

## 2011-03-16 ENCOUNTER — Ambulatory Visit (HOSPITAL_BASED_OUTPATIENT_CLINIC_OR_DEPARTMENT_OTHER): Payer: Medicare Other

## 2011-03-16 DIAGNOSIS — D45 Polycythemia vera: Secondary | ICD-10-CM

## 2011-03-16 DIAGNOSIS — G4700 Insomnia, unspecified: Secondary | ICD-10-CM

## 2011-03-16 MED ORDER — TEMAZEPAM 30 MG PO CAPS: 30.0000 mg | ORAL_CAPSULE | Freq: Every evening | ORAL | Status: DC | PRN

## 2011-03-16 NOTE — Telephone Encounter (Signed)
Refilled meds per fax request.  

## 2011-03-16 NOTE — Progress Notes (Signed)
Patient ok for phlebotomy per dr. [name].  Clarified order prior to doing phlebotomy

## 2011-03-16 NOTE — Patient Instructions (Signed)
Therapeutic Phlebotomy Care After Refer to this sheet in the next few weeks. These instructions provide you with information on caring for yourself after your procedure. Your caregiver may also give you more specific instructions. Your treatment has been planned according to current medical practices, but problems sometimes occur. Call your caregiver if you have any problems or questions after your procedure. HOME CARE INSTRUCTIONS Most people can go back to their normal activities right away. Before you leave, be sure to ask if there is anything you should or should not do. In general, it would be wise to:  Keep the bandage dry. You can remove the bandage after about 5 hours.   Eat well-balanced meals for the next 24 hours.   Drink enough fluids to keep your urine clear or pale yellow.   Avoid drinking alcohol minimally until after eating.   Avoid smoking for at least 30 minutes after the procedure.   Avoid strenous physical activity or heavy lifting or pulling for about 5 hours after the procedure.   Athletes should avoid strenous exercise for 12 hours or more.   Change positions slowly for the remainder of the day to prevent lightheadedness or fainting.   If you feel lightheaded, lie down until the feeling subsides.   If you have bleeding from the needle insertion site, elevate your arm and press firmly on the site until the bleeding stops.   If bruising or bleeding appears under the skin, apply ice to the area for 15 to 20 minutes, 3 to 4 times per day. Put the ice in a plastic bag and place a towel between the bag of ice and your skin. Do this while you are awake for the first 24 hours. The ice packs can be stopped before 24 hours if the swelling goes away. If swelling persists after 24 hours, a warm, moist washcloth can be applied to the area for 15 to 20 minutes, 3 to 4 times per day. The warm, moist treatments can be stopped when the swelling goes away.   It is important to  continue further therapeutic phlebotomy as directed by your caregiver.  SEEK MEDICAL CARE IF:  There is bleeding or fluid leaking from the needle insertion site.   The needle insertion site becomes swollen, red, or sore.   You feel lightheaded, dizzy or nauseated, and the feeling does not go away.   You notice new bruising at the needle insertion site.   You feel more weak or tired than normal.   You develop a fever.  SEEK IMMEDIATE MEDICAL CARE IF:   There is increased bleeding, pain, or swelling from the needle insertion site.   You have severe nausea or vomiting.   You have chest pain.   You have trouble breathing.  MAKE SURE YOU:  Understand these instructions.   Will watch your condition.   Will get help right away if you are not doing well or get worse.  Document Released: 08/17/2010 Document Revised: 11/25/2010 Document Reviewed: 08/17/2010 [facility] Patient Information 2012 [place], [place].

## 2011-03-16 NOTE — Progress Notes (Signed)
[name] presents today for phlebotomy per MD orders. Phlebotomy procedure started at 1130 and ended at 1145.   500 grams removed. Patient observed for 30 minutes after procedure without any incident. Patient tolerated procedure well. IV needle removed intact.

## 2011-03-18 ENCOUNTER — Other Ambulatory Visit: Payer: Self-pay | Admitting: Hematology & Oncology

## 2011-03-18 ENCOUNTER — Ambulatory Visit (HOSPITAL_BASED_OUTPATIENT_CLINIC_OR_DEPARTMENT_OTHER): Payer: Medicare Other | Admitting: Hematology & Oncology

## 2011-03-18 ENCOUNTER — Other Ambulatory Visit (HOSPITAL_BASED_OUTPATIENT_CLINIC_OR_DEPARTMENT_OTHER): Payer: Medicare Other | Admitting: Lab

## 2011-03-18 VITALS — BP 118/66 | HR 93 | Temp 97.0°F | Ht 63.0 in | Wt 167.0 lb

## 2011-03-18 DIAGNOSIS — D45 Polycythemia vera: Secondary | ICD-10-CM

## 2011-03-18 DIAGNOSIS — J329 Chronic sinusitis, unspecified: Secondary | ICD-10-CM

## 2011-03-18 DIAGNOSIS — J3489 Other specified disorders of nose and nasal sinuses: Secondary | ICD-10-CM

## 2011-03-18 DIAGNOSIS — D509 Iron deficiency anemia, unspecified: Secondary | ICD-10-CM

## 2011-03-18 LAB — COMPREHENSIVE METABOLIC PANEL
ALT: 8 U/L (ref 0–35)
AST: 12 U/L (ref 0–37)
Albumin: 4.3 g/dL (ref 3.5–5.2)
BUN: 8 mg/dL (ref 6–23)
Calcium: 8.6 mg/dL (ref 8.4–10.5)
Chloride: 99 mEq/L (ref 96–112)
Potassium: 3.9 mEq/L (ref 3.5–5.3)
Sodium: 138 mEq/L (ref 135–145)
Total Protein: 6.4 g/dL (ref 6.0–8.3)

## 2011-03-18 LAB — CBC WITH DIFFERENTIAL (CANCER CENTER ONLY)
BASO%: 0 % (ref 0.0–2.0)
LYMPH%: 15 % (ref 14.0–48.0)
MCH: 15.2 pg — ABNORMAL LOW (ref 26.0–34.0)
MCV: 63 fL — ABNORMAL LOW (ref 81–101)
MONO%: 11.9 % (ref 0.0–13.0)
Platelets: 222 10*3/uL (ref 145–400)
RDW: 23 % — ABNORMAL HIGH (ref 11.1–15.7)
WBC: 4.8 10*3/uL (ref 3.9–10.0)

## 2011-03-18 LAB — IRON AND TIBC: UIBC: 559 ug/dL — ABNORMAL HIGH (ref 125–400)

## 2011-03-18 NOTE — Progress Notes (Signed)
This office note has been dictated.

## 2011-03-19 NOTE — Progress Notes (Signed)
CC:   [name], M.D.  DIAGNOSES: 1. Polycythemia vera, hyperviscosity variant. 2. Chronic fibromyalgia. 3. Coronary artery disease.  CURRENT THERAPY:  Phlebotomy to maintain a hemoglobin less than 6.  INTERIM HISTORY:  Ms. Harms comes in for her follow-up.  She is doing okay.  She got phlebotomized yesterday.  This did help her out.  She does feel a little bit better today.  She has had no problem with fever.  She has had some sinus congestion. She keeps apparently having sinus problems.  We probably are going to refer her back to the infectious disease clinic at [facility]  She has seen Dr. Ninetta Lights before.  We will see about getting an appointment for her over there.  She has had no change in bowel or bladder habits.  She has gained some weight.  Her appetite remains good.  She has had no leg swelling.  There have been no rashes.  She continues on OxyContin and oxycodone.  This has helped her quite a bit.  She is functional with this.  Her quality of life is certainly a lot better.  PHYSICAL EXAMINATION:  General Appearance:  This is a pale white female in no obvious distress.  Vital Signs:  97.4, pulse 93, respiratory rate 20, blood pressure 118/66.  Weight is 167.  Head and Neck Exam:  Shows a normocephalic, atraumatic skull.  There are no ocular or oral lesions. There are no palpable cervical or supraclavicular lymph nodes.  Lungs: Clear bilaterally.  Cardiac Exam:  Regular rate and rhythm with a normal S1 and S2.  She has a 1/6 systolic ejection murmur.  Abdominal Exam: Soft with good bowel sounds.  There is no fluid wave.  There is no palpable hepatosplenomegaly.  Back Exam:  No tenderness over the spine, ribs or hips.  Extremities:  Show no clubbing, cyanosis or edema.  She has good range of motion of her joints.  Neurological Exam:  Shows no focal neurological deficits.  LABORATORY STUDIES:  White cell count is 4.8, hemoglobin 6.1, hematocrit 25.3,  platelet count 222.  MCV is 63.  IMPRESSION:  Ms. Fulbright is a 65 year old white female with hyperviscosity variant polycythemia.  We really need to be aggressive in keeping her hemoglobin less than 6.  Below 6, she functions incredibly well.  We will continue to have her blood work checked every couple of weeks. We will then plan to get her back to see [place] in another 2 months.  Again, we will see about referring her to the infectious disease clinic at [facility].    ______________________________ Josph Macho, M.D. PRE/MEDQ  D:  03/18/2011  T:  03/19/2011  Job:  [id number]

## 2011-03-24 ENCOUNTER — Telehealth: Payer: Self-pay | Admitting: Hematology & Oncology

## 2011-03-24 NOTE — Telephone Encounter (Signed)
Patient called for phlebotomy appointment on 03-25-11; no times available.  I offered her 03-26-11 at 12:15.  Patient said OK; I scheduled appointment.

## 2011-03-25 ENCOUNTER — Other Ambulatory Visit: Payer: Self-pay | Admitting: Hematology & Oncology

## 2011-03-25 DIAGNOSIS — D45 Polycythemia vera: Secondary | ICD-10-CM

## 2011-03-26 ENCOUNTER — Ambulatory Visit (HOSPITAL_BASED_OUTPATIENT_CLINIC_OR_DEPARTMENT_OTHER): Payer: Medicare Other

## 2011-03-26 VITALS — BP 115/69 | HR 82 | Temp 97.4°F

## 2011-03-26 DIAGNOSIS — A499 Bacterial infection, unspecified: Secondary | ICD-10-CM

## 2011-03-26 DIAGNOSIS — D45 Polycythemia vera: Secondary | ICD-10-CM

## 2011-03-26 MED ORDER — CEPHALEXIN 500 MG PO CAPS: 500.0000 mg | ORAL_CAPSULE | Freq: Two times a day (BID) | ORAL | Status: DC

## 2011-03-26 NOTE — Progress Notes (Signed)
Addended by: Arlan Organ R on: 03/26/2011 01:18 PM   Modules accepted: Orders

## 2011-03-31 ENCOUNTER — Other Ambulatory Visit (HOSPITAL_BASED_OUTPATIENT_CLINIC_OR_DEPARTMENT_OTHER): Payer: Medicare Other | Admitting: Lab

## 2011-03-31 ENCOUNTER — Other Ambulatory Visit: Payer: Self-pay | Admitting: *Deleted

## 2011-03-31 ENCOUNTER — Other Ambulatory Visit: Payer: Self-pay | Admitting: Hematology & Oncology

## 2011-03-31 DIAGNOSIS — D45 Polycythemia vera: Secondary | ICD-10-CM

## 2011-03-31 DIAGNOSIS — IMO0001 Reserved for inherently not codable concepts without codable children: Secondary | ICD-10-CM | POA: Diagnosis not present

## 2011-03-31 DIAGNOSIS — M797 Fibromyalgia: Secondary | ICD-10-CM

## 2011-03-31 LAB — CBC WITH DIFFERENTIAL (CANCER CENTER ONLY)
BASO#: 0 10*3/uL (ref 0.0–0.2)
Eosinophils Absolute: 0.2 10*3/uL (ref 0.0–0.5)
HCT: 23.4 % — ABNORMAL LOW (ref 34.8–46.6)
HGB: 5.6 g/dL — CL (ref 11.6–15.9)
LYMPH#: 0.8 10*3/uL — ABNORMAL LOW (ref 0.9–3.3)
LYMPH%: 12.9 % — ABNORMAL LOW (ref 14.0–48.0)
MCV: 64 fL — ABNORMAL LOW (ref 81–101)
MONO#: 0.6 10*3/uL (ref 0.1–0.9)
NEUT%: 73 % (ref 39.6–80.0)
RBC: 3.65 10*6/uL — ABNORMAL LOW (ref 3.70–5.32)
WBC: 5.8 10*3/uL (ref 3.9–10.0)

## 2011-03-31 MED ORDER — OXYCODONE HCL 15 MG PO TABS: 15.0000 mg | ORAL_TABLET | Freq: Three times a day (TID) | ORAL | Status: DC | PRN

## 2011-03-31 MED ORDER — OXYCODONE HCL 10 MG PO TB12: 10.0000 mg | ORAL_TABLET | Freq: Two times a day (BID) | ORAL | Status: DC

## 2011-03-31 MED ORDER — OXYCODONE HCL 20 MG PO TB12: 20.0000 mg | ORAL_TABLET | Freq: Three times a day (TID) | ORAL | Status: DC

## 2011-03-31 NOTE — Telephone Encounter (Signed)
Pt arrived to the office for her lab appt stating she needs to have her meds refilled. Asked if she called ahead of time and she stated "I did but didn't wait when I was placed on hold because I don't have many minutes on my phone". Reviewed previous rx schedule and dated appropriately. Last rx was given on 03/08/11 for Oxycontin,&  Oxycodone.

## 2011-04-14 ENCOUNTER — Other Ambulatory Visit: Payer: Self-pay | Admitting: Hematology & Oncology

## 2011-04-14 ENCOUNTER — Other Ambulatory Visit (HOSPITAL_BASED_OUTPATIENT_CLINIC_OR_DEPARTMENT_OTHER): Payer: Medicare Other | Admitting: Lab

## 2011-04-14 DIAGNOSIS — J329 Chronic sinusitis, unspecified: Secondary | ICD-10-CM

## 2011-04-14 DIAGNOSIS — D45 Polycythemia vera: Secondary | ICD-10-CM | POA: Diagnosis not present

## 2011-04-14 LAB — CBC WITH DIFFERENTIAL (CANCER CENTER ONLY)
BASO#: 0 10*3/uL (ref 0.0–0.2)
BASO%: 0.3 % (ref 0.0–2.0)
EOS%: 4.1 % (ref 0.0–7.0)
HCT: 24.3 % — ABNORMAL LOW (ref 34.8–46.6)
HGB: 6 g/dL — CL (ref 11.6–15.9)
LYMPH%: 15.8 % (ref 14.0–48.0)
MCHC: 24.7 g/dL — ABNORMAL LOW (ref 32.0–36.0)
MCV: 62 fL — ABNORMAL LOW (ref 81–101)
NEUT%: 68.3 % (ref 39.6–80.0)
RDW: 22.4 % — ABNORMAL HIGH (ref 11.1–15.7)

## 2011-04-28 ENCOUNTER — Other Ambulatory Visit (HOSPITAL_BASED_OUTPATIENT_CLINIC_OR_DEPARTMENT_OTHER): Payer: Medicare Other | Admitting: Lab

## 2011-04-28 DIAGNOSIS — D45 Polycythemia vera: Secondary | ICD-10-CM | POA: Diagnosis not present

## 2011-04-28 DIAGNOSIS — IMO0001 Reserved for inherently not codable concepts without codable children: Secondary | ICD-10-CM

## 2011-04-28 LAB — CBC WITH DIFFERENTIAL (CANCER CENTER ONLY)
BASO#: 0 10*3/uL (ref 0.0–0.2)
BASO%: 0.3 % (ref 0.0–2.0)
Eosinophils Absolute: 0.1 10*3/uL (ref 0.0–0.5)
HCT: 26.5 % — ABNORMAL LOW (ref 34.8–46.6)
HGB: 6.3 g/dL — CL (ref 11.6–15.9)
LYMPH%: 18.4 % (ref 14.0–48.0)
MCV: 63 fL — ABNORMAL LOW (ref 81–101)
MONO#: 0.6 10*3/uL (ref 0.1–0.9)
NEUT%: 63.6 % (ref 39.6–80.0)
RDW: 22.8 % — ABNORMAL HIGH (ref 11.1–15.7)
WBC: 3.9 10*3/uL (ref 3.9–10.0)

## 2011-04-30 ENCOUNTER — Ambulatory Visit (HOSPITAL_BASED_OUTPATIENT_CLINIC_OR_DEPARTMENT_OTHER): Payer: Medicare Other

## 2011-04-30 ENCOUNTER — Other Ambulatory Visit: Payer: Self-pay | Admitting: *Deleted

## 2011-04-30 VITALS — BP 118/65 | HR 71 | Temp 98.0°F

## 2011-04-30 DIAGNOSIS — D45 Polycythemia vera: Secondary | ICD-10-CM | POA: Diagnosis not present

## 2011-04-30 MED ORDER — CEPHALEXIN 500 MG PO CAPS: 500.0000 mg | ORAL_CAPSULE | Freq: Two times a day (BID) | ORAL | Status: DC

## 2011-04-30 NOTE — Patient Instructions (Signed)

## 2011-04-30 NOTE — Progress Notes (Signed)
[name] presents today for phlebotomy per MD orders. Phlebotomy procedure started at 1045 and ended at 1055 500 grams removed. Patient observed for 30 minutes after procedure without any incident. Patient tolerated procedure well. IV needle removed intact.

## 2011-05-06 NOTE — Telephone Encounter (Signed)
Medication approved for Cephalexin per dr. [name].

## 2011-05-10 ENCOUNTER — Other Ambulatory Visit: Payer: Self-pay | Admitting: *Deleted

## 2011-05-10 MED ORDER — AZITHROMYCIN 500 MG PO TABS: 500.0000 mg | ORAL_TABLET | Freq: Two times a day (BID) | ORAL | Status: DC

## 2011-05-10 NOTE — Telephone Encounter (Signed)
Refilled azithromycin one time only

## 2011-05-12 ENCOUNTER — Other Ambulatory Visit: Payer: Self-pay | Admitting: *Deleted

## 2011-05-12 ENCOUNTER — Other Ambulatory Visit (HOSPITAL_BASED_OUTPATIENT_CLINIC_OR_DEPARTMENT_OTHER): Payer: Medicare Other | Admitting: Lab

## 2011-05-12 DIAGNOSIS — D45 Polycythemia vera: Secondary | ICD-10-CM

## 2011-05-12 DIAGNOSIS — IMO0001 Reserved for inherently not codable concepts without codable children: Secondary | ICD-10-CM

## 2011-05-12 DIAGNOSIS — M797 Fibromyalgia: Secondary | ICD-10-CM

## 2011-05-12 LAB — CBC WITH DIFFERENTIAL (CANCER CENTER ONLY)
BASO#: 0 10*3/uL (ref 0.0–0.2)
Eosinophils Absolute: 0.1 10*3/uL (ref 0.0–0.5)
HGB: 5.9 g/dL — CL (ref 11.6–15.9)
LYMPH#: 0.8 10*3/uL — ABNORMAL LOW (ref 0.9–3.3)
NEUT#: 2.6 10*3/uL (ref 1.5–6.5)
RBC: 3.84 10*6/uL (ref 3.70–5.32)
WBC: 4.1 10*3/uL (ref 3.9–10.0)

## 2011-05-12 MED ORDER — OXYCODONE HCL 15 MG PO TABS: 15.0000 mg | ORAL_TABLET | Freq: Three times a day (TID) | ORAL | Status: DC | PRN

## 2011-05-12 MED ORDER — OXYCODONE HCL 20 MG PO TB12: 20.0000 mg | ORAL_TABLET | Freq: Three times a day (TID) | ORAL | Status: DC

## 2011-05-12 MED ORDER — OXYCODONE HCL 10 MG PO TB12: 10.0000 mg | ORAL_TABLET | Freq: Two times a day (BID) | ORAL | Status: DC

## 2011-05-12 NOTE — Telephone Encounter (Signed)
Pt called on Monday 05/10/11 asking to have her Oxycontin 10 and 20 mg tabs along with Oxy-IR prescriptions ready for pick up when she came in for her labs today. Will route for Dr [name] for approval.

## 2011-05-25 ENCOUNTER — Other Ambulatory Visit: Payer: Self-pay | Admitting: *Deleted

## 2011-05-26 ENCOUNTER — Other Ambulatory Visit (HOSPITAL_BASED_OUTPATIENT_CLINIC_OR_DEPARTMENT_OTHER): Payer: Medicare Other | Admitting: Lab

## 2011-05-26 DIAGNOSIS — IMO0001 Reserved for inherently not codable concepts without codable children: Secondary | ICD-10-CM | POA: Diagnosis not present

## 2011-05-26 DIAGNOSIS — D45 Polycythemia vera: Secondary | ICD-10-CM

## 2011-05-26 LAB — CBC WITH DIFFERENTIAL (CANCER CENTER ONLY)
BASO#: 0 10*3/uL (ref 0.0–0.2)
Eosinophils Absolute: 0.2 10*3/uL (ref 0.0–0.5)
HGB: 6.6 g/dL — CL (ref 11.6–15.9)
LYMPH%: 18.2 % (ref 14.0–48.0)
MCH: 15.5 pg — ABNORMAL LOW (ref 26.0–34.0)
MCV: 64 fL — ABNORMAL LOW (ref 81–101)
MONO#: 0.6 10*3/uL (ref 0.1–0.9)
MONO%: 12.6 % (ref 0.0–13.0)
NEUT#: 3 10*3/uL (ref 1.5–6.5)
RBC: 4.26 10*6/uL (ref 3.70–5.32)

## 2011-05-27 ENCOUNTER — Ambulatory Visit (HOSPITAL_BASED_OUTPATIENT_CLINIC_OR_DEPARTMENT_OTHER): Payer: Medicare Other

## 2011-05-27 VITALS — BP 133/72 | HR 63 | Temp 96.6°F

## 2011-05-27 DIAGNOSIS — D45 Polycythemia vera: Secondary | ICD-10-CM

## 2011-05-27 NOTE — Patient Instructions (Signed)

## 2011-05-27 NOTE — Progress Notes (Signed)
[name] presents today for phlebotomy per MD orders. Phlebotomy procedure started at 1100 and ended at 1115. 500 grams removed. Patient observed for 30 minutes after procedure without any incident. Patient tolerated procedure well. IV needle removed intact.   

## 2011-06-04 ENCOUNTER — Other Ambulatory Visit: Payer: Self-pay | Admitting: Hematology & Oncology

## 2011-06-09 ENCOUNTER — Other Ambulatory Visit: Payer: Medicare Other | Admitting: Lab

## 2011-06-09 ENCOUNTER — Other Ambulatory Visit (HOSPITAL_BASED_OUTPATIENT_CLINIC_OR_DEPARTMENT_OTHER): Payer: Medicare Other | Admitting: Lab

## 2011-06-09 ENCOUNTER — Ambulatory Visit (HOSPITAL_BASED_OUTPATIENT_CLINIC_OR_DEPARTMENT_OTHER): Payer: Medicare Other | Admitting: Hematology & Oncology

## 2011-06-09 VITALS — BP 115/54 | HR 76 | Temp 96.6°F | Wt 170.0 lb

## 2011-06-09 DIAGNOSIS — IMO0001 Reserved for inherently not codable concepts without codable children: Secondary | ICD-10-CM

## 2011-06-09 DIAGNOSIS — G8929 Other chronic pain: Secondary | ICD-10-CM

## 2011-06-09 DIAGNOSIS — D45 Polycythemia vera: Secondary | ICD-10-CM

## 2011-06-09 DIAGNOSIS — M797 Fibromyalgia: Secondary | ICD-10-CM

## 2011-06-09 LAB — CBC WITH DIFFERENTIAL (CANCER CENTER ONLY)
BASO%: 0.2 % (ref 0.0–2.0)
EOS%: 3.4 % (ref 0.0–7.0)
HCT: 25.7 % — ABNORMAL LOW (ref 34.8–46.6)
LYMPH#: 0.9 10*3/uL (ref 0.9–3.3)
LYMPH%: 16.2 % (ref 14.0–48.0)
MCHC: 24.1 g/dL — ABNORMAL LOW (ref 32.0–36.0)
MCV: 64 fL — ABNORMAL LOW (ref 81–101)
MONO%: 12 % (ref 0.0–13.0)
NEUT%: 68.2 % (ref 39.6–80.0)
RDW: 22.6 % — ABNORMAL HIGH (ref 11.1–15.7)

## 2011-06-09 MED ORDER — OXYCODONE HCL 10 MG PO TB12: 10.0000 mg | ORAL_TABLET | Freq: Two times a day (BID) | ORAL | Status: DC

## 2011-06-09 MED ORDER — OXYCODONE HCL 20 MG PO TB12: 20.0000 mg | ORAL_TABLET | Freq: Three times a day (TID) | ORAL | Status: DC

## 2011-06-09 MED ORDER — OXYCODONE HCL 15 MG PO TABS: 15.0000 mg | ORAL_TABLET | Freq: Three times a day (TID) | ORAL | Status: DC | PRN

## 2011-06-09 NOTE — Progress Notes (Signed)
Diagnosis: Polycythemia vera-hyperviscosity variant.  Current therapy: Phlebotomy to maintain hemoglobin below 6  Interim history: [name]  comes in for followup. She is doing okay. She was lastphlebotomized 2 weeks ago. She really optimizes her function when her hemoglobin is below 6. She does have bad fibromyalgia and chronic pain issues. She is on OxyContin and oxycodone which does make your life much more functional.  She's not had any chest pain. She is fatigue. I told her that this was because she is markedly iron deficient which she needs to be.  She is on a lot of medications. She is  followed by Dr. Patty Sermons  of cardiology and he has not have to make any adjustments to her medication list.  Her physical exam this is a well-developed well-nourished white female with alopecia. Vital signs show a temperature of 96.6. Pulse 76. Respiratory rate 16. Blood pressure 115/54. Weight is 170 pounds. Head and neck exam shows moderate alopecia. She haspale conjunctiva. There is no adenopathy in her neck. Lungs are clear bilaterally. Cardiac exam regular rate and rhythm with a normal S1 and S2. She has a 1/6 systolic ejection murmur. Abdominal exam soft with good bowel sounds. There is no fluid wave. There's no palpable hepatosplenomegaly. Back exam no tenderness over the spine ribs or hips. Extremities shows no clubbing cyanosis or edem Skin exam no rashes ecchymoses or petechia.    Laboratory studies white count 5.7 he was 6.2 hematocrit 25.7 reticulocyte count 229. MCV is 64.   Impression: [name] is a 66 year old white female with. Polycythemia. She has a markedly hyper viscus for August. She really doesn't with a markedly low hemoglobin.  We will phlebotomize her tomorrow.  We will have her come back every 2 weeks with lab work. Biopsy her back in 8 weeks.

## 2011-06-10 ENCOUNTER — Ambulatory Visit (HOSPITAL_BASED_OUTPATIENT_CLINIC_OR_DEPARTMENT_OTHER): Payer: Medicare Other

## 2011-06-10 VITALS — BP 114/68 | HR 79 | Temp 97.6°F

## 2011-06-10 DIAGNOSIS — D45 Polycythemia vera: Secondary | ICD-10-CM | POA: Diagnosis not present

## 2011-06-10 NOTE — Patient Instructions (Signed)
Therapeutic Phlebotomy Therapeutic phlebotomy is the controlled removal of blood from your body for the purpose of treating a medical condition. It is similar to donating blood. Usually, about a pint (470 mL) of blood is removed. The average adult has 9 to 12 pints (4.3 to 5.7 L) of blood. Therapeutic phlebotomy may be used to treat the following medical conditions:  Hemochromatosis. This is a condition in which there is too much iron in the blood.   Polycythemia vera. This is a condition in which there are too many red cells in the blood.   Porphyria cutanea tarda. This is a disease usually passed from one generation to the next (inherited). It is a condition in which an important part of hemoglobin is not made properly. This results in the build up of abnormal amounts of porphyrins in the body.   Sickle cell disease. This is an inherited disease. It is a condition in which the red blood cells form an abnormal crescent shape rather than a round shape.  LET YOUR CAREGIVER KNOW ABOUT:  Allergies.   Medicines taken including herbs, eyedrops, over-the-counter medicines, and creams.   Use of steroids (by mouth or creams).   Previous problems with anesthetics or numbing medicine.   History of blood clots.   History of bleeding or blood problems.   Previous surgery.   Possibility of pregnancy, if this applies.  RISKS AND COMPLICATIONS This is a simple and safe procedure. Problems are unlikely. However, problems can occur and may include:  Nausea or lightheadedness.   Low blood pressure.   Soreness, bleeding, swelling, or bruising at the needle insertion site.   Infection.  BEFORE THE PROCEDURE  This is a procedure that can be done as an outpatient. Confirm the time that you need to arrive for your procedure. Confirm whether there is a need to fast or withhold any medications. It is helpful to wear clothing with sleeves that can be raised above the elbow. A blood sample may be done  to determine the amount of red blood cells or iron in your blood. Plan ahead of time to have someone drive you home after the procedure. PROCEDURE The entire procedure from preparation through recovery takes about 1 hour. The actual collection takes about 10 to 15 minutes.  A needle will be inserted into your vein.   Tubing and a collection bag will be attached to that needle.   Blood will flow through the needle and tubing into the collection bag.   You may be asked to open and close your hand slowly and continuously during the entire collection.   Once the specified amount of blood has been removed from your body, the collection bag and tubing will be clamped.   The needle will be removed.   Pressure will be held on the site of the needle insertion to stop the bleeding. Then a bandage will be placed over the needle insertion site.  AFTER THE PROCEDURE  Your recovery will be assessed and monitored. If there are no problems, as an outpatient, you should be able to go home shortly after the procedure.  Document Released: 08/17/2010 Document Revised: 03/04/2011 Document Reviewed: 08/17/2010 [employer] Patient Information 2012 [employer]. 

## 2011-06-10 NOTE — Progress Notes (Signed)
[name] presents today for phlebotomy per MD orders. Phlebotomy procedure started at 1250 and ended at 1305  500 grams removed. Patient observed for 30 minutes after procedure without any incident. Patient tolerated procedure well. IV needle removed intact.

## 2011-06-16 ENCOUNTER — Other Ambulatory Visit (HOSPITAL_BASED_OUTPATIENT_CLINIC_OR_DEPARTMENT_OTHER): Payer: Medicare Other | Admitting: Lab

## 2011-06-16 DIAGNOSIS — D45 Polycythemia vera: Secondary | ICD-10-CM | POA: Diagnosis not present

## 2011-06-16 LAB — CBC WITH DIFFERENTIAL (CANCER CENTER ONLY)
EOS%: 2.9 % (ref 0.0–7.0)
MCH: 15.6 pg — ABNORMAL LOW (ref 26.0–34.0)
MCHC: 24.2 g/dL — ABNORMAL LOW (ref 32.0–36.0)
MONO%: 11.6 % (ref 0.0–13.0)
NEUT#: 3.1 10*3/uL (ref 1.5–6.5)
Platelets: 201 10*3/uL (ref 145–400)
RDW: 22.4 % — ABNORMAL HIGH (ref 11.1–15.7)

## 2011-06-23 ENCOUNTER — Other Ambulatory Visit (HOSPITAL_BASED_OUTPATIENT_CLINIC_OR_DEPARTMENT_OTHER): Payer: Medicare Other | Admitting: Lab

## 2011-06-23 DIAGNOSIS — D45 Polycythemia vera: Secondary | ICD-10-CM | POA: Diagnosis not present

## 2011-06-23 DIAGNOSIS — E039 Hypothyroidism, unspecified: Secondary | ICD-10-CM

## 2011-06-23 LAB — CBC WITH DIFFERENTIAL (CANCER CENTER ONLY)
BASO#: 0 10*3/uL (ref 0.0–0.2)
Eosinophils Absolute: 0.3 10*3/uL (ref 0.0–0.5)
HCT: 24 % — ABNORMAL LOW (ref 34.8–46.6)
HGB: 5.7 g/dL — CL (ref 11.6–15.9)
LYMPH%: 14.8 % (ref 14.0–48.0)
MCH: 15.2 pg — ABNORMAL LOW (ref 26.0–34.0)
MCV: 64 fL — ABNORMAL LOW (ref 81–101)
MONO%: 12.3 % (ref 0.0–13.0)
NEUT%: 67.3 % (ref 39.6–80.0)
RBC: 3.75 10*6/uL (ref 3.70–5.32)

## 2011-06-24 DIAGNOSIS — H43819 Vitreous degeneration, unspecified eye: Secondary | ICD-10-CM | POA: Diagnosis not present

## 2011-06-29 ENCOUNTER — Telehealth: Payer: Self-pay | Admitting: Cardiology

## 2011-06-29 DIAGNOSIS — E039 Hypothyroidism, unspecified: Secondary | ICD-10-CM

## 2011-06-29 DIAGNOSIS — I10 Essential (primary) hypertension: Secondary | ICD-10-CM

## 2011-06-29 MED ORDER — THYROID 60 MG PO TABS: 60.0000 mg | ORAL_TABLET | Freq: Every day | ORAL | Status: DC

## 2011-06-29 MED ORDER — BISOPROLOL FUMARATE 10 MG PO TABS: ORAL_TABLET | ORAL | Status: DC

## 2011-06-29 MED ORDER — LISINOPRIL 10 MG PO TABS: ORAL_TABLET | ORAL | Status: DC

## 2011-06-29 NOTE — Telephone Encounter (Signed)
Rx's sent to pharmacy.  

## 2011-06-29 NOTE — Telephone Encounter (Signed)
Those increases are okay.

## 2011-06-29 NOTE — Telephone Encounter (Signed)
Will forward to  Dr. Patty Sermons for review to make sure ok to take 2 Bisoprolol daily

## 2011-06-29 NOTE — Telephone Encounter (Signed)
PT CALLING Re  MEDS THAT NEED TO BE CALLED IN EARLY,  LISINOPRIL 10MG , BISOPROLOL 10MG , AND ARMOUR THYROID 60MG , bp has been high so has been taking two tabs instead of one, and thyroid was two tabs and she takes 2 1/2 , uses [facility] [place] [phone], pt requeseting call when done [phone]

## 2011-07-06 ENCOUNTER — Other Ambulatory Visit: Payer: Self-pay | Admitting: Cardiology

## 2011-07-06 DIAGNOSIS — N390 Urinary tract infection, site not specified: Secondary | ICD-10-CM

## 2011-07-06 DIAGNOSIS — G4700 Insomnia, unspecified: Secondary | ICD-10-CM

## 2011-07-07 ENCOUNTER — Other Ambulatory Visit: Payer: Self-pay | Admitting: *Deleted

## 2011-07-07 ENCOUNTER — Other Ambulatory Visit (HOSPITAL_BASED_OUTPATIENT_CLINIC_OR_DEPARTMENT_OTHER): Payer: Medicare Other | Admitting: Lab

## 2011-07-07 DIAGNOSIS — D45 Polycythemia vera: Secondary | ICD-10-CM

## 2011-07-07 DIAGNOSIS — M797 Fibromyalgia: Secondary | ICD-10-CM

## 2011-07-07 DIAGNOSIS — IMO0001 Reserved for inherently not codable concepts without codable children: Secondary | ICD-10-CM

## 2011-07-07 LAB — CBC WITH DIFFERENTIAL (CANCER CENTER ONLY)
BASO#: 0 10*3/uL (ref 0.0–0.2)
BASO%: 0.2 % (ref 0.0–2.0)
HCT: 25 % — ABNORMAL LOW (ref 34.8–46.6)
HGB: 6 g/dL — CL (ref 11.6–15.9)
LYMPH#: 0.7 10*3/uL — ABNORMAL LOW (ref 0.9–3.3)
MONO#: 0.7 10*3/uL (ref 0.1–0.9)
NEUT%: 63.2 % (ref 39.6–80.0)
WBC: 4.2 10*3/uL (ref 3.9–10.0)

## 2011-07-07 MED ORDER — OXYCODONE HCL 20 MG PO TB12: 20.0000 mg | ORAL_TABLET | Freq: Three times a day (TID) | ORAL | Status: DC

## 2011-07-07 MED ORDER — OXYCODONE HCL 15 MG PO TABS: 15.0000 mg | ORAL_TABLET | Freq: Three times a day (TID) | ORAL | Status: DC | PRN

## 2011-07-07 MED ORDER — OXYCODONE HCL 10 MG PO TB12: 10.0000 mg | ORAL_TABLET | Freq: Two times a day (BID) | ORAL | Status: DC

## 2011-07-07 NOTE — Telephone Encounter (Signed)
Pt called yesterday requesting Oxycontin and Oxy-IR refills to be picked up tomorrow while she is here for her lab appt.

## 2011-07-07 NOTE — Progress Notes (Signed)
Pt came to the office today for a CBC check. She wishes to come back later on this week for a phleb but wants to have her CBC checked prior to doing so. Sent request to scheduling.

## 2011-07-11 MED ORDER — TEMAZEPAM 30 MG PO CAPS: 30.0000 mg | ORAL_CAPSULE | Freq: Every evening | ORAL | Status: DC | PRN

## 2011-07-15 ENCOUNTER — Ambulatory Visit (HOSPITAL_BASED_OUTPATIENT_CLINIC_OR_DEPARTMENT_OTHER): Payer: Medicare Other

## 2011-07-15 ENCOUNTER — Ambulatory Visit: Payer: Medicare Other | Admitting: Lab

## 2011-07-15 DIAGNOSIS — D45 Polycythemia vera: Secondary | ICD-10-CM

## 2011-07-15 LAB — CBC WITH DIFFERENTIAL (CANCER CENTER ONLY)
Eosinophils Absolute: 0.1 10*3/uL (ref 0.0–0.5)
HCT: 26.7 % — ABNORMAL LOW (ref 34.8–46.6)
LYMPH%: 10.9 % — ABNORMAL LOW (ref 14.0–48.0)
MCH: 15.3 pg — ABNORMAL LOW (ref 26.0–34.0)
MCV: 64 fL — ABNORMAL LOW (ref 81–101)
MONO%: 12.1 % (ref 0.0–13.0)
NEUT%: 74.4 % (ref 39.6–80.0)
Platelets: 220 10*3/uL (ref 145–400)
RBC: 4.17 10*6/uL (ref 3.70–5.32)

## 2011-07-15 NOTE — Patient Instructions (Signed)
Therapeutic Phlebotomy Therapeutic phlebotomy is the controlled removal of blood from your body for the purpose of treating a medical condition. It is similar to donating blood. Usually, about a pint (470 mL) of blood is removed. The average adult has 9 to 12 pints (4.3 to 5.7 L) of blood. Therapeutic phlebotomy may be used to treat the following medical conditions:  Hemochromatosis. This is a condition in which there is too much iron in the blood.   Polycythemia vera. This is a condition in which there are too many red cells in the blood.   Porphyria cutanea tarda. This is a disease usually passed from one generation to the next (inherited). It is a condition in which an important part of hemoglobin is not made properly. This results in the build up of abnormal amounts of porphyrins in the body.   Sickle cell disease. This is an inherited disease. It is a condition in which the red blood cells form an abnormal crescent shape rather than a round shape.  LET YOUR CAREGIVER KNOW ABOUT:  Allergies.   Medicines taken including herbs, eyedrops, over-the-counter medicines, and creams.   Use of steroids (by mouth or creams).   Previous problems with anesthetics or numbing medicine.   History of blood clots.   History of bleeding or blood problems.   Previous surgery.   Possibility of pregnancy, if this applies.  RISKS AND COMPLICATIONS This is a simple and safe procedure. Problems are unlikely. However, problems can occur and may include:  Nausea or lightheadedness.   Low blood pressure.   Soreness, bleeding, swelling, or bruising at the needle insertion site.   Infection.  BEFORE THE PROCEDURE  This is a procedure that can be done as an outpatient. Confirm the time that you need to arrive for your procedure. Confirm whether there is a need to fast or withhold any medications. It is helpful to wear clothing with sleeves that can be raised above the elbow. A blood sample may be done  to determine the amount of red blood cells or iron in your blood. Plan ahead of time to have someone drive you home after the procedure. PROCEDURE The entire procedure from preparation through recovery takes about 1 hour. The actual collection takes about 10 to 15 minutes.  A needle will be inserted into your vein.   Tubing and a collection bag will be attached to that needle.   Blood will flow through the needle and tubing into the collection bag.   You may be asked to open and close your hand slowly and continuously during the entire collection.   Once the specified amount of blood has been removed from your body, the collection bag and tubing will be clamped.   The needle will be removed.   Pressure will be held on the site of the needle insertion to stop the bleeding. Then a bandage will be placed over the needle insertion site.  AFTER THE PROCEDURE  Your recovery will be assessed and monitored. If there are no problems, as an outpatient, you should be able to go home shortly after the procedure.  Document Released: 08/17/2010 Document Revised: 03/04/2011 Document Reviewed: 08/17/2010 [employer] Patient Information 2012 [employer]. 

## 2011-07-15 NOTE — Progress Notes (Signed)
Stark Klein presents today for phlebotomy per MD orders. Phlebotomy procedure started at 1250 and ended at 1305 500 grams removed. Patient observed for 30 minutes after procedure without any incident. Patient tolerated procedure well. IV needle removed intact.

## 2011-07-16 ENCOUNTER — Other Ambulatory Visit: Payer: Medicare Other | Admitting: Lab

## 2011-07-21 ENCOUNTER — Other Ambulatory Visit (HOSPITAL_BASED_OUTPATIENT_CLINIC_OR_DEPARTMENT_OTHER): Payer: Medicare Other | Admitting: Lab

## 2011-07-21 DIAGNOSIS — D45 Polycythemia vera: Secondary | ICD-10-CM

## 2011-07-21 LAB — CBC WITH DIFFERENTIAL (CANCER CENTER ONLY)
BASO#: 0 10*3/uL (ref 0.0–0.2)
EOS%: 3 % (ref 0.0–7.0)
Eosinophils Absolute: 0.1 10*3/uL (ref 0.0–0.5)
HCT: 22.5 % — ABNORMAL LOW (ref 34.8–46.6)
HGB: 5.4 g/dL — CL (ref 11.6–15.9)
LYMPH%: 15.3 % (ref 14.0–48.0)
MCH: 15.3 pg — ABNORMAL LOW (ref 26.0–34.0)
MCHC: 24 g/dL — ABNORMAL LOW (ref 32.0–36.0)
MCV: 64 fL — ABNORMAL LOW (ref 81–101)
MONO%: 14 % — ABNORMAL HIGH (ref 0.0–13.0)
NEUT#: 3.2 10*3/uL (ref 1.5–6.5)
NEUT%: 67.5 % (ref 39.6–80.0)

## 2011-07-22 DIAGNOSIS — H04129 Dry eye syndrome of unspecified lacrimal gland: Secondary | ICD-10-CM | POA: Diagnosis not present

## 2011-07-22 DIAGNOSIS — H3410 Central retinal artery occlusion, unspecified eye: Secondary | ICD-10-CM | POA: Diagnosis not present

## 2011-08-04 ENCOUNTER — Ambulatory Visit: Payer: Medicare Other | Admitting: Hematology & Oncology

## 2011-08-04 ENCOUNTER — Other Ambulatory Visit: Payer: Self-pay | Admitting: *Deleted

## 2011-08-04 ENCOUNTER — Other Ambulatory Visit (HOSPITAL_BASED_OUTPATIENT_CLINIC_OR_DEPARTMENT_OTHER): Payer: Medicare Other | Admitting: Lab

## 2011-08-04 DIAGNOSIS — M797 Fibromyalgia: Secondary | ICD-10-CM

## 2011-08-04 DIAGNOSIS — D45 Polycythemia vera: Secondary | ICD-10-CM | POA: Diagnosis not present

## 2011-08-04 LAB — CBC WITH DIFFERENTIAL (CANCER CENTER ONLY)
BASO#: 0 10*3/uL (ref 0.0–0.2)
EOS%: 2.9 % (ref 0.0–7.0)
Eosinophils Absolute: 0.1 10*3/uL (ref 0.0–0.5)
HCT: 24.9 % — ABNORMAL LOW (ref 34.8–46.6)
HGB: 6 g/dL — CL (ref 11.6–15.9)
LYMPH#: 0.5 10*3/uL — ABNORMAL LOW (ref 0.9–3.3)
MCH: 15.2 pg — ABNORMAL LOW (ref 26.0–34.0)
MCHC: 24.1 g/dL — ABNORMAL LOW (ref 32.0–36.0)
NEUT%: 67.1 % (ref 39.6–80.0)
RBC: 3.94 10*6/uL (ref 3.70–5.32)

## 2011-08-04 MED ORDER — OXYCODONE HCL 15 MG PO TABS: 15.0000 mg | ORAL_TABLET | Freq: Three times a day (TID) | ORAL | Status: DC | PRN

## 2011-08-04 MED ORDER — OXYCODONE HCL 10 MG PO TB12: 10.0000 mg | ORAL_TABLET | Freq: Two times a day (BID) | ORAL | Status: DC

## 2011-08-04 MED ORDER — OXYCODONE HCL 20 MG PO TB12: 20.0000 mg | ORAL_TABLET | Freq: Three times a day (TID) | ORAL | Status: DC

## 2011-08-04 NOTE — Telephone Encounter (Signed)
Pt left message asking to have her pain medications refilled while here for her lab appt.  Will route to Dr Ennever for approval and place at the front desk when ready. 

## 2011-08-09 ENCOUNTER — Other Ambulatory Visit: Payer: Self-pay | Admitting: Cardiology

## 2011-08-09 NOTE — Telephone Encounter (Signed)
Refilled furosemide and microzide

## 2011-08-12 ENCOUNTER — Ambulatory Visit (HOSPITAL_BASED_OUTPATIENT_CLINIC_OR_DEPARTMENT_OTHER): Payer: Medicare Other

## 2011-08-12 VITALS — BP 120/69 | HR 76 | Temp 97.4°F

## 2011-08-12 DIAGNOSIS — D45 Polycythemia vera: Secondary | ICD-10-CM | POA: Diagnosis not present

## 2011-08-12 NOTE — Progress Notes (Signed)
[name] presents today for phlebotomy per MD orders. Phlebotomy procedure started at 1155 and ended at 1203. Approximately  removed. Patient observed for 30 minutes after procedure without any incident. Patient tolerated procedure well. IV needle removed intact.

## 2011-08-12 NOTE — Patient Instructions (Signed)

## 2011-08-18 ENCOUNTER — Other Ambulatory Visit (HOSPITAL_BASED_OUTPATIENT_CLINIC_OR_DEPARTMENT_OTHER): Payer: Medicare Other | Admitting: Lab

## 2011-08-18 ENCOUNTER — Ambulatory Visit: Payer: Medicare Other | Admitting: Hematology & Oncology

## 2011-08-18 ENCOUNTER — Other Ambulatory Visit: Payer: Medicare Other | Admitting: Lab

## 2011-08-18 DIAGNOSIS — D45 Polycythemia vera: Secondary | ICD-10-CM

## 2011-08-18 LAB — CBC WITH DIFFERENTIAL (CANCER CENTER ONLY)
BASO%: 0 % (ref 0.0–2.0)
Eosinophils Absolute: 0.1 10*3/uL (ref 0.0–0.5)
LYMPH%: 11 % — ABNORMAL LOW (ref 14.0–48.0)
MCH: 15.3 pg — ABNORMAL LOW (ref 26.0–34.0)
MONO%: 12.2 % (ref 0.0–13.0)
NEUT#: 3.2 10*3/uL (ref 1.5–6.5)
Platelets: 227 10*3/uL (ref 145–400)
RBC: 3.59 10*6/uL — ABNORMAL LOW (ref 3.70–5.32)
RDW: 22.5 % — ABNORMAL HIGH (ref 11.1–15.7)
WBC: 4.3 10*3/uL (ref 3.9–10.0)

## 2011-09-01 ENCOUNTER — Other Ambulatory Visit: Payer: Medicare Other | Admitting: Lab

## 2011-09-01 ENCOUNTER — Ambulatory Visit (HOSPITAL_BASED_OUTPATIENT_CLINIC_OR_DEPARTMENT_OTHER): Payer: Medicare Other | Admitting: Hematology & Oncology

## 2011-09-01 ENCOUNTER — Other Ambulatory Visit (HOSPITAL_BASED_OUTPATIENT_CLINIC_OR_DEPARTMENT_OTHER): Payer: Medicare Other | Admitting: Lab

## 2011-09-01 ENCOUNTER — Other Ambulatory Visit: Payer: Self-pay | Admitting: *Deleted

## 2011-09-01 VITALS — BP 137/52 | HR 81 | Temp 97.6°F | Ht 63.0 in | Wt 172.0 lb

## 2011-09-01 DIAGNOSIS — M797 Fibromyalgia: Secondary | ICD-10-CM

## 2011-09-01 DIAGNOSIS — D509 Iron deficiency anemia, unspecified: Secondary | ICD-10-CM

## 2011-09-01 DIAGNOSIS — D45 Polycythemia vera: Secondary | ICD-10-CM

## 2011-09-01 DIAGNOSIS — J329 Chronic sinusitis, unspecified: Secondary | ICD-10-CM

## 2011-09-01 LAB — CBC WITH DIFFERENTIAL (CANCER CENTER ONLY)
BASO%: 0.2 % (ref 0.0–2.0)
EOS%: 4 % (ref 0.0–7.0)
HCT: 24.8 % — ABNORMAL LOW (ref 34.8–46.6)
LYMPH%: 16.9 % (ref 14.0–48.0)
MCH: 15.5 pg — ABNORMAL LOW (ref 26.0–34.0)
MCHC: 23.4 g/dL — ABNORMAL LOW (ref 32.0–36.0)
MCV: 66 fL — ABNORMAL LOW (ref 81–101)
MONO#: 0.5 10*3/uL (ref 0.1–0.9)
MONO%: 10.8 % (ref 0.0–13.0)
NEUT%: 68.1 % (ref 39.6–80.0)
Platelets: 240 10*3/uL (ref 145–400)
RDW: 22.5 % — ABNORMAL HIGH (ref 11.1–15.7)
WBC: 4.3 10*3/uL (ref 3.9–10.0)

## 2011-09-01 LAB — COMPREHENSIVE METABOLIC PANEL
ALT: <8 U/L (ref 0–35)
AST: 12 U/L (ref 0–37)
Alkaline Phosphatase: 69 U/L (ref 39–117)
CO2: 32 mEq/L (ref 19–32)
Creatinine, Ser: 0.65 mg/dL (ref 0.50–1.10)
Sodium: 139 mEq/L (ref 135–145)
Total Bilirubin: 0.5 mg/dL (ref 0.3–1.2)
Total Protein: 6.6 g/dL (ref 6.0–8.3)

## 2011-09-01 LAB — FERRITIN: Ferritin: <1 ng/mL — ABNORMAL LOW (ref 10–291)

## 2011-09-01 MED ORDER — OXYCODONE HCL 15 MG PO TABS: 15.0000 mg | ORAL_TABLET | Freq: Three times a day (TID) | ORAL | Status: DC | PRN

## 2011-09-01 MED ORDER — OXYCODONE HCL 10 MG PO TB12: 10.0000 mg | ORAL_TABLET | Freq: Two times a day (BID) | ORAL | Status: DC

## 2011-09-01 MED ORDER — OXYCODONE HCL 20 MG PO TB12: 20.0000 mg | ORAL_TABLET | Freq: Three times a day (TID) | ORAL | Status: DC

## 2011-09-01 NOTE — Telephone Encounter (Signed)
Pt here for an appt. Needs to have rx refilled. Routed to Dr [name] for approval.

## 2011-09-01 NOTE — Progress Notes (Signed)
This office note has been dictated.

## 2011-09-02 NOTE — Progress Notes (Signed)
CC:   [name], M.D.  DIAGNOSIS:  Polycythemia vera, hyperviscosity variant.  CURRENT THERAPY:  Phlebotomy to maintain a hemoglobin below 6.  INTERIM HISTORY:  [name] comes in for followup.  She is doing okay. She has really had no specific complaints outside of some issues with trembling.  I am not sure exactly what is going on with that.  She also was noting some pain in the back of her scalp.  This appeared to be more so on the left side. Said the pain was  sharp and radiated up to her eye.  She was wondering if this might be some kind of temporal vein issue.  I told her it did not sound like temporal arteritis to me.  She also is taking, I think, estrogen with a low amount of testosterone. Again, I do not think that this is related to her polycythemia.  I told her this is something that helps her quality of life and that she should continue on this.  She continues to have fibromyalgia.  OxyContin and oxycodone seems to help her out with this.  She is markedly iron deficient.  Her ferritin is routinely less than 4.  She was last phlebotomized, I think, probably back in late April or early May.  PHYSICAL EXAMINATION:  This is a pale appearing white female in no obvious distress.  Vital signs:  97.6, pulse 81, respiratory rate 20, blood pressure 137/52.  Weight is 172.  Head and neck:  Normocephalic, atraumatic skull.  There are no ocular or oral lesions.  There are no palpable cervical or supraclavicular lymph nodes.  Lungs:  Clear to percussion and auscultation bilaterally.  Cardiac:  Regular rate and rhythm with a normal S1 and S2.  She has a 1/6 systolic ejection murmur, which is chronic.  Abdomen:  Soft with good bowel sounds.  She has no fluid wave.  There is no guarding or rebound tenderness.  There is no palpable hepatosplenomegaly. Back: No tenderness over the spine, ribs, or hips.  Extremities:  No clubbing, cyanosis or edema.  Neurologic:  No focal  neurological deficits.  Skin:  No rash, ecchymosis or petechia.  LABORATORY STUDIES:  White cell count is 4.3, hemoglobin 5.8, hematocrit 24.8, platelet count is 240.  MCV is 66.  IMPRESSION:  [name] is a 66 year old white female with a hyperviscosity variant of polycythemia.  She literally "lives" with hemoglobin below 6.  This is how she is most functional.  She is followed every 2 weeks with lab work.  We will continue this. She will always tell us when she feels she needs to be phlebotomized.  Will plan to get her back to see [place] in another 8 weeks.  Again, I am not sure why she is having some of these issues.  I did recommend that she  could be seen by Neurology.  I am not sure if she will do this or not.  She does see Dr. Patty Sermons for some of her primary care issues.  He might be able to make some recommendations.    ______________________________ Josph Macho, M.D. PRE/MEDQ  D:  09/01/2011  T:  09/02/2011  Job:  [id number]

## 2011-09-15 ENCOUNTER — Other Ambulatory Visit (HOSPITAL_BASED_OUTPATIENT_CLINIC_OR_DEPARTMENT_OTHER): Payer: Medicare Other | Admitting: Lab

## 2011-09-15 DIAGNOSIS — D45 Polycythemia vera: Secondary | ICD-10-CM

## 2011-09-15 LAB — CBC WITH DIFFERENTIAL (CANCER CENTER ONLY)
BASO#: 0 10*3/uL (ref 0.0–0.2)
EOS%: 3 % (ref 0.0–7.0)
HGB: 5.8 g/dL — CL (ref 11.6–15.9)
LYMPH#: 0.7 10*3/uL — ABNORMAL LOW (ref 0.9–3.3)
MCHC: 23.9 g/dL — ABNORMAL LOW (ref 32.0–36.0)
MONO#: 0.6 10*3/uL (ref 0.1–0.9)
NEUT#: 2.9 10*3/uL (ref 1.5–6.5)
RBC: 3.67 10*6/uL — ABNORMAL LOW (ref 3.70–5.32)
WBC: 4.3 10*3/uL (ref 3.9–10.0)

## 2011-09-29 ENCOUNTER — Other Ambulatory Visit (HOSPITAL_BASED_OUTPATIENT_CLINIC_OR_DEPARTMENT_OTHER): Payer: Medicare Other | Admitting: Lab

## 2011-09-29 ENCOUNTER — Ambulatory Visit: Payer: Medicare Other | Admitting: Hematology & Oncology

## 2011-09-29 ENCOUNTER — Other Ambulatory Visit: Payer: Self-pay | Admitting: *Deleted

## 2011-09-29 DIAGNOSIS — D45 Polycythemia vera: Secondary | ICD-10-CM | POA: Diagnosis not present

## 2011-09-29 DIAGNOSIS — M797 Fibromyalgia: Secondary | ICD-10-CM

## 2011-09-29 LAB — CBC WITH DIFFERENTIAL (CANCER CENTER ONLY)
BASO#: 0 10*3/uL (ref 0.0–0.2)
Eosinophils Absolute: 0.2 10*3/uL (ref 0.0–0.5)
HCT: 27.3 % — ABNORMAL LOW (ref 34.8–46.6)
HGB: 6.6 g/dL — CL (ref 11.6–15.9)
LYMPH#: 0.8 10*3/uL — ABNORMAL LOW (ref 0.9–3.3)
MCH: 15.7 pg — ABNORMAL LOW (ref 26.0–34.0)
NEUT#: 3 10*3/uL (ref 1.5–6.5)
RBC: 4.2 10*6/uL (ref 3.70–5.32)

## 2011-09-29 MED ORDER — OXYCODONE HCL 20 MG PO TB12: 20.0000 mg | ORAL_TABLET | Freq: Three times a day (TID) | ORAL | Status: DC

## 2011-09-29 MED ORDER — OXYCODONE HCL 15 MG PO TABS: 15.0000 mg | ORAL_TABLET | Freq: Three times a day (TID) | ORAL | Status: DC | PRN

## 2011-09-29 MED ORDER — OXYCODONE HCL 10 MG PO TB12: 10.0000 mg | ORAL_TABLET | Freq: Two times a day (BID) | ORAL | Status: DC

## 2011-09-29 NOTE — Telephone Encounter (Signed)
Pt left message asking to have her pain medications refilled while here for her lab appt.  Will route to Dr Ennever for approval and place at the front desk when ready. 

## 2011-10-04 ENCOUNTER — Ambulatory Visit (HOSPITAL_BASED_OUTPATIENT_CLINIC_OR_DEPARTMENT_OTHER): Payer: Medicare Other

## 2011-10-04 VITALS — BP 129/68 | HR 82 | Temp 97.2°F

## 2011-10-04 DIAGNOSIS — D45 Polycythemia vera: Secondary | ICD-10-CM

## 2011-10-04 NOTE — Patient Instructions (Signed)
Therapeutic Phlebotomy Therapeutic phlebotomy is the controlled removal of blood from your body for the purpose of treating a medical condition. It is similar to donating blood. Usually, about a pint (470 mL) of blood is removed. The average adult has 9 to 12 pints (4.3 to 5.7 L) of blood. Therapeutic phlebotomy may be used to treat the following medical conditions:  Hemochromatosis. This is a condition in which there is too much iron in the blood.   Polycythemia vera. This is a condition in which there are too many red cells in the blood.   Porphyria cutanea tarda. This is a disease usually passed from one generation to the next (inherited). It is a condition in which an important part of hemoglobin is not made properly. This results in the build up of abnormal amounts of porphyrins in the body.   Sickle cell disease. This is an inherited disease. It is a condition in which the red blood cells form an abnormal crescent shape rather than a round shape.  LET YOUR CAREGIVER KNOW ABOUT:  Allergies.   Medicines taken including herbs, eyedrops, over-the-counter medicines, and creams.   Use of steroids (by mouth or creams).   Previous problems with anesthetics or numbing medicine.   History of blood clots.   History of bleeding or blood problems.   Previous surgery.   Possibility of pregnancy, if this applies.  RISKS AND COMPLICATIONS This is a simple and safe procedure. Problems are unlikely. However, problems can occur and may include:  Nausea or lightheadedness.   Low blood pressure.   Soreness, bleeding, swelling, or bruising at the needle insertion site.   Infection.  BEFORE THE PROCEDURE  This is a procedure that can be done as an outpatient. Confirm the time that you need to arrive for your procedure. Confirm whether there is a need to fast or withhold any medications. It is helpful to wear clothing with sleeves that can be raised above the elbow. A blood sample may be done  to determine the amount of red blood cells or iron in your blood. Plan ahead of time to have someone drive you home after the procedure. PROCEDURE The entire procedure from preparation through recovery takes about 1 hour. The actual collection takes about 10 to 15 minutes.  A needle will be inserted into your vein.   Tubing and a collection bag will be attached to that needle.   Blood will flow through the needle and tubing into the collection bag.   You may be asked to open and close your hand slowly and continuously during the entire collection.   Once the specified amount of blood has been removed from your body, the collection bag and tubing will be clamped.   The needle will be removed.   Pressure will be held on the site of the needle insertion to stop the bleeding. Then a bandage will be placed over the needle insertion site.  AFTER THE PROCEDURE  Your recovery will be assessed and monitored. If there are no problems, as an outpatient, you should be able to go home shortly after the procedure.  Document Released: 08/17/2010 Document Revised: 03/04/2011 Document Reviewed: 08/17/2010 [employer] Patient Information 2012 [employer]. 

## 2011-10-04 NOTE — Progress Notes (Signed)
[name] presents today for phlebotomy per MD orders. Phlebotomy procedure started at 1300 and ended at1315 500 grams removed. Patient observed for 30 minutes after procedure without any incident. Patient tolerated procedure well. IV needle removed intact.

## 2011-10-06 ENCOUNTER — Other Ambulatory Visit: Payer: Self-pay | Admitting: Hematology & Oncology

## 2011-10-13 ENCOUNTER — Other Ambulatory Visit (HOSPITAL_BASED_OUTPATIENT_CLINIC_OR_DEPARTMENT_OTHER): Payer: Medicare Other | Admitting: Lab

## 2011-10-13 ENCOUNTER — Telehealth: Payer: Self-pay | Admitting: Hematology & Oncology

## 2011-10-13 DIAGNOSIS — D45 Polycythemia vera: Secondary | ICD-10-CM

## 2011-10-13 LAB — CBC WITH DIFFERENTIAL (CANCER CENTER ONLY)
BASO%: 0 % (ref 0.0–2.0)
EOS%: 5.1 % (ref 0.0–7.0)
HCT: 24.5 % — ABNORMAL LOW (ref 34.8–46.6)
LYMPH#: 0.7 10*3/uL — ABNORMAL LOW (ref 0.9–3.3)
MCHC: 24.1 g/dL — ABNORMAL LOW (ref 32.0–36.0)
MONO#: 0.5 10*3/uL (ref 0.1–0.9)
NEUT#: 1.8 10*3/uL (ref 1.5–6.5)
Platelets: 215 10*3/uL (ref 145–400)
RDW: 22 % — ABNORMAL HIGH (ref 11.1–15.7)
WBC: 3.1 10*3/uL — ABNORMAL LOW (ref 3.9–10.0)

## 2011-10-13 NOTE — Telephone Encounter (Signed)
Pt resch phlebotomy appt from 10/13/11 to 10/20/11

## 2011-10-13 NOTE — Telephone Encounter (Signed)
Pt called and cx her 1:30pm phlebotomy appt but stated she will keep her 1pm lab appt

## 2011-10-20 ENCOUNTER — Ambulatory Visit (HOSPITAL_BASED_OUTPATIENT_CLINIC_OR_DEPARTMENT_OTHER): Payer: Medicare Other

## 2011-10-20 VITALS — BP 139/73 | HR 60

## 2011-10-20 DIAGNOSIS — D45 Polycythemia vera: Secondary | ICD-10-CM | POA: Diagnosis not present

## 2011-10-20 NOTE — Progress Notes (Signed)
[name] presents today for phlebotomy per MD orders. Phlebotomy procedure started at 1150 and ended at 1205 cc  500 cc removed. Patient tolerated procedure well. IV needle removed intact.

## 2011-10-20 NOTE — Patient Instructions (Signed)
Therapeutic Phlebotomy Therapeutic phlebotomy is the controlled removal of blood from your body for the purpose of treating a medical condition. It is similar to donating blood. Usually, about a pint (470 mL) of blood is removed. The average adult has 9 to 12 pints (4.3 to 5.7 L) of blood. Therapeutic phlebotomy may be used to treat the following medical conditions:  Hemochromatosis. This is a condition in which there is too much iron in the blood.   Polycythemia vera. This is a condition in which there are too many red cells in the blood.   Porphyria cutanea tarda. This is a disease usually passed from one generation to the next (inherited). It is a condition in which an important part of hemoglobin is not made properly. This results in the build up of abnormal amounts of porphyrins in the body.   Sickle cell disease. This is an inherited disease. It is a condition in which the red blood cells form an abnormal crescent shape rather than a round shape.  LET YOUR CAREGIVER KNOW ABOUT:  Allergies.   Medicines taken including herbs, eyedrops, over-the-counter medicines, and creams.   Use of steroids (by mouth or creams).   Previous problems with anesthetics or numbing medicine.   History of blood clots.   History of bleeding or blood problems.   Previous surgery.   Possibility of pregnancy, if this applies.  RISKS AND COMPLICATIONS This is a simple and safe procedure. Problems are unlikely. However, problems can occur and may include:  Nausea or lightheadedness.   Low blood pressure.   Soreness, bleeding, swelling, or bruising at the needle insertion site.   Infection.  BEFORE THE PROCEDURE  This is a procedure that can be done as an outpatient. Confirm the time that you need to arrive for your procedure. Confirm whether there is a need to fast or withhold any medications. It is helpful to wear clothing with sleeves that can be raised above the elbow. A blood sample may be done  to determine the amount of red blood cells or iron in your blood. Plan ahead of time to have someone drive you home after the procedure. PROCEDURE The entire procedure from preparation through recovery takes about 1 hour. The actual collection takes about 10 to 15 minutes.  A needle will be inserted into your vein.   Tubing and a collection bag will be attached to that needle.   Blood will flow through the needle and tubing into the collection bag.   You may be asked to open and close your hand slowly and continuously during the entire collection.   Once the specified amount of blood has been removed from your body, the collection bag and tubing will be clamped.   The needle will be removed.   Pressure will be held on the site of the needle insertion to stop the bleeding. Then a bandage will be placed over the needle insertion site.  AFTER THE PROCEDURE  Your recovery will be assessed and monitored. If there are no problems, as an outpatient, you should be able to go home shortly after the procedure.  Document Released: 08/17/2010 Document Revised: 03/04/2011 Document Reviewed: 08/17/2010 [employer] Patient Information 2012 [employer]. 

## 2011-10-26 ENCOUNTER — Other Ambulatory Visit: Payer: Self-pay | Admitting: Cardiology

## 2011-10-26 NOTE — Telephone Encounter (Signed)
Reviewed Rx, Rx bisoprolol has remaining refills within the pharmacy. Spoke with pharmacy to confirm. Refill request denied.

## 2011-10-27 ENCOUNTER — Other Ambulatory Visit (HOSPITAL_BASED_OUTPATIENT_CLINIC_OR_DEPARTMENT_OTHER): Payer: Medicare Other | Admitting: Lab

## 2011-10-27 ENCOUNTER — Ambulatory Visit (HOSPITAL_BASED_OUTPATIENT_CLINIC_OR_DEPARTMENT_OTHER): Payer: Medicare Other | Admitting: Hematology & Oncology

## 2011-10-27 ENCOUNTER — Other Ambulatory Visit: Payer: Medicare Other | Admitting: Lab

## 2011-10-27 ENCOUNTER — Other Ambulatory Visit: Payer: Self-pay | Admitting: *Deleted

## 2011-10-27 VITALS — BP 111/44 | HR 76 | Temp 97.4°F | Ht 63.0 in | Wt 172.0 lb

## 2011-10-27 DIAGNOSIS — M797 Fibromyalgia: Secondary | ICD-10-CM

## 2011-10-27 DIAGNOSIS — D45 Polycythemia vera: Secondary | ICD-10-CM

## 2011-10-27 DIAGNOSIS — D509 Iron deficiency anemia, unspecified: Secondary | ICD-10-CM

## 2011-10-27 LAB — CBC WITH DIFFERENTIAL (CANCER CENTER ONLY)
BASO#: 0 10*3/uL (ref 0.0–0.2)
BASO%: 0.2 % (ref 0.0–2.0)
EOS%: 1.4 % (ref 0.0–7.0)
HCT: 24.5 % — ABNORMAL LOW (ref 34.8–46.6)
HGB: 5.9 g/dL — CL (ref 11.6–15.9)
LYMPH#: 0.8 10*3/uL — ABNORMAL LOW (ref 0.9–3.3)
MONO#: 0.7 10*3/uL (ref 0.1–0.9)
NEUT#: 3.5 10*3/uL (ref 1.5–6.5)
NEUT%: 69.5 % (ref 39.6–80.0)
RBC: 3.73 10*6/uL (ref 3.70–5.32)
WBC: 5.1 10*3/uL (ref 3.9–10.0)

## 2011-10-27 MED ORDER — OXYCODONE HCL 20 MG PO TB12: 20.0000 mg | ORAL_TABLET | Freq: Three times a day (TID) | ORAL | Status: DC

## 2011-10-27 MED ORDER — OXYCODONE HCL 10 MG PO TB12: 10.0000 mg | ORAL_TABLET | Freq: Two times a day (BID) | ORAL | Status: DC

## 2011-10-27 MED ORDER — OXYCODONE HCL 15 MG PO TABS: 15.0000 mg | ORAL_TABLET | Freq: Three times a day (TID) | ORAL | Status: DC | PRN

## 2011-10-27 NOTE — Telephone Encounter (Signed)
Pt left message asking to have her pain medications refilled while here for her lab appt.  Will route to Dr Ennever for approval and place at the front desk when ready. 

## 2011-10-27 NOTE — Progress Notes (Signed)
This office note has been dictated.

## 2011-10-28 NOTE — Progress Notes (Signed)
CC:   [name], M.D.  DIAGNOSIS:  Polycythemia vera, hyperviscosity variant.  CURRENT THERAPY:  Phlebotomy to maintain hemoglobin below 6.  INTERIM HISTORY:  [name] comes in for followup.  She is doing okay. She is having some neurological issues.  She says she is having tremors. She has some weakness in her right leg.  She says that the knee just gives out.  She wants to see a neurologist.  She wants to see 1 in [place].  I certainly have no problems with this.  I could not imagine this being from the polycythemia.  She clearly is not hyperviscous.  I just wonder if it is not from all the medicines that she is taking with some interactions that are ongoing with these medications.  She clearly is iron deficient.  I suppose that being iron deficient would affect her muscle function.  Her last ferritin was less than 1 that we checked back in June.  She has had no bleeding.  She has had no fever.  There has been no cough.  She has awful fibromyalgia.  She is on OxyContin and oxycodone, which actually help her out quite a bit and make her quite functional.  PHYSICAL EXAMINATION:  General:  This is a well-developed, well- nourished white female who is slightly pale.  Vital Signs:  Temperature 97.4, pulse 76, respiratory rate 18, blood pressure 111/44, weight is 172.  Head and Neck Exam:  Shows a normocephalic, atraumatic skull. There are no ocular or oral lesions.  There are no palpable cervical or supraclavicular lymph nodes.  Lungs:  Clear bilaterally.  Cardiac Exam: Regular rate and rhythm with a normal S1 and S2.  She has a 1/6 systolic ejection murmur.  Extremities:  Show no clubbing, cyanosis, or edema. Neurological Exam:  Shows no focal neurological deficits.  I do not detect any obvious tremors.  Skin Exam:  Shows some slight paleness to the skin.  LABORATORY STUDIES:  White cell count 5.1, hemoglobin 5.9, hematocrit 24.5, platelet count 295.  MCV is  66.  IMPRESSION:  [name] is a 66 year old white female with polycythemia vera.  This is a definite hyperviscosity-type variant.  She wants to have another phlebotomy.  We will go ahead and get this set up for next week.  We have her blood checked every 2 weeks.  I gave her scripts for medications.  I will plan to see her back myself in another 2 months.    ______________________________ Josph Macho, M.D. PRE/MEDQ  D:  10/27/2011  T:  10/28/2011  Job:  [id number]

## 2011-11-04 ENCOUNTER — Ambulatory Visit (HOSPITAL_BASED_OUTPATIENT_CLINIC_OR_DEPARTMENT_OTHER): Payer: Medicare Other

## 2011-11-04 VITALS — BP 129/74 | HR 69 | Temp 97.1°F | Resp 20 | Ht 63.0 in | Wt 172.0 lb

## 2011-11-04 DIAGNOSIS — C880 Waldenstrom macroglobulinemia: Secondary | ICD-10-CM | POA: Diagnosis not present

## 2011-11-04 DIAGNOSIS — D45 Polycythemia vera: Secondary | ICD-10-CM

## 2011-11-04 LAB — CBC WITH DIFFERENTIAL (CANCER CENTER ONLY)
BASO#: 0 10*3/uL (ref 0.0–0.2)
EOS%: 2.1 % (ref 0.0–7.0)
Eosinophils Absolute: 0.1 10*3/uL (ref 0.0–0.5)
HCT: 24.5 % — ABNORMAL LOW (ref 34.8–46.6)
HGB: 6 g/dL — CL (ref 11.6–15.9)
LYMPH#: 0.8 10*3/uL — ABNORMAL LOW (ref 0.9–3.3)
MCH: 15.7 pg — ABNORMAL LOW (ref 26.0–34.0)
MCHC: 24.5 g/dL — ABNORMAL LOW (ref 32.0–36.0)
NEUT#: 4.5 10*3/uL (ref 1.5–6.5)
NEUT%: 73.3 % (ref 39.6–80.0)
RBC: 3.82 10*6/uL (ref 3.70–5.32)

## 2011-11-04 NOTE — Progress Notes (Signed)
[name] presents today for phlebotomy per MD orders. Phlebotomy procedure started at 1200 and ended at 1215 500 cc removed. Patient tolerated procedure well. IV needle removed intact.

## 2011-11-04 NOTE — Patient Instructions (Signed)
Therapeutic Phlebotomy Therapeutic phlebotomy is the controlled removal of blood from your body for the purpose of treating a medical condition. It is similar to donating blood. Usually, about a pint (470 mL) of blood is removed. The average adult has 9 to 12 pints (4.3 to 5.7 L) of blood. Therapeutic phlebotomy may be used to treat the following medical conditions:  Hemochromatosis. This is a condition in which there is too much iron in the blood.   Polycythemia vera. This is a condition in which there are too many red cells in the blood.   Porphyria cutanea tarda. This is a disease usually passed from one generation to the next (inherited). It is a condition in which an important part of hemoglobin is not made properly. This results in the build up of abnormal amounts of porphyrins in the body.   Sickle cell disease. This is an inherited disease. It is a condition in which the red blood cells form an abnormal crescent shape rather than a round shape.  LET YOUR CAREGIVER KNOW ABOUT:  Allergies.   Medicines taken including herbs, eyedrops, over-the-counter medicines, and creams.   Use of steroids (by mouth or creams).   Previous problems with anesthetics or numbing medicine.   History of blood clots.   History of bleeding or blood problems.   Previous surgery.   Possibility of pregnancy, if this applies.  RISKS AND COMPLICATIONS This is a simple and safe procedure. Problems are unlikely. However, problems can occur and may include:  Nausea or lightheadedness.   Low blood pressure.   Soreness, bleeding, swelling, or bruising at the needle insertion site.   Infection.  BEFORE THE PROCEDURE  This is a procedure that can be done as an outpatient. Confirm the time that you need to arrive for your procedure. Confirm whether there is a need to fast or withhold any medications. It is helpful to wear clothing with sleeves that can be raised above the elbow. A blood sample may be done  to determine the amount of red blood cells or iron in your blood. Plan ahead of time to have someone drive you home after the procedure. PROCEDURE The entire procedure from preparation through recovery takes about 1 hour. The actual collection takes about 10 to 15 minutes.  A needle will be inserted into your vein.   Tubing and a collection bag will be attached to that needle.   Blood will flow through the needle and tubing into the collection bag.   You may be asked to open and close your hand slowly and continuously during the entire collection.   Once the specified amount of blood has been removed from your body, the collection bag and tubing will be clamped.   The needle will be removed.   Pressure will be held on the site of the needle insertion to stop the bleeding. Then a bandage will be placed over the needle insertion site.  AFTER THE PROCEDURE  Your recovery will be assessed and monitored. If there are no problems, as an outpatient, you should be able to go home shortly after the procedure.  Document Released: 08/17/2010 Document Revised: 03/04/2011 Document Reviewed: 08/17/2010 [employer] Patient Information 2012 [employer]. 

## 2011-11-10 ENCOUNTER — Other Ambulatory Visit (HOSPITAL_BASED_OUTPATIENT_CLINIC_OR_DEPARTMENT_OTHER): Payer: Medicare Other | Admitting: Lab

## 2011-11-10 ENCOUNTER — Other Ambulatory Visit (INDEPENDENT_AMBULATORY_CARE_PROVIDER_SITE_OTHER): Payer: Medicare Other

## 2011-11-10 DIAGNOSIS — I119 Hypertensive heart disease without heart failure: Secondary | ICD-10-CM | POA: Diagnosis not present

## 2011-11-10 DIAGNOSIS — E039 Hypothyroidism, unspecified: Secondary | ICD-10-CM

## 2011-11-10 DIAGNOSIS — D45 Polycythemia vera: Secondary | ICD-10-CM

## 2011-11-10 LAB — BASIC METABOLIC PANEL
BUN: 7 mg/dL (ref 6–23)
CO2: 29 mEq/L (ref 19–32)
Chloride: 98 mEq/L (ref 96–112)
Creatinine, Ser: 0.7 mg/dL (ref 0.4–1.2)
Potassium: 4.2 mEq/L (ref 3.5–5.1)

## 2011-11-10 LAB — LIPID PANEL
LDL Cholesterol: 60 mg/dL (ref 0–99)
Total CHOL/HDL Ratio: 4
VLDL: 25.6 mg/dL (ref 0.0–40.0)

## 2011-11-10 LAB — CBC WITH DIFFERENTIAL (CANCER CENTER ONLY)
BASO%: 0.3 % (ref 0.0–2.0)
EOS%: 3.1 % (ref 0.0–7.0)
LYMPH%: 14 % (ref 14.0–48.0)
MCHC: 23.7 g/dL — ABNORMAL LOW (ref 32.0–36.0)
MCV: 65 fL — ABNORMAL LOW (ref 81–101)
MONO#: 0.4 10*3/uL (ref 0.1–0.9)
MONO%: 11.8 % (ref 0.0–13.0)
NEUT%: 70.8 % (ref 39.6–80.0)
Platelets: 282 10*3/uL (ref 145–400)
RDW: 20.6 % — ABNORMAL HIGH (ref 11.1–15.7)
WBC: 3.6 10*3/uL — ABNORMAL LOW (ref 3.9–10.0)

## 2011-11-10 LAB — HEPATIC FUNCTION PANEL
Albumin: 3.6 g/dL (ref 3.5–5.2)
Alkaline Phosphatase: 58 U/L (ref 39–117)
Bilirubin, Direct: 0 mg/dL (ref 0.0–0.3)

## 2011-11-10 LAB — T4, FREE: Free T4: 0.71 ng/dL (ref 0.60–1.60)

## 2011-11-10 LAB — TSH: TSH: 0.05 u[IU]/mL — ABNORMAL LOW (ref 0.35–5.50)

## 2011-11-10 NOTE — Progress Notes (Signed)
Quick Note:  Please make copy of labs for patient visit. ______ 

## 2011-11-18 ENCOUNTER — Ambulatory Visit (INDEPENDENT_AMBULATORY_CARE_PROVIDER_SITE_OTHER): Payer: Medicare Other | Admitting: Cardiology

## 2011-11-18 ENCOUNTER — Encounter: Payer: Self-pay | Admitting: Cardiology

## 2011-11-18 VITALS — BP 120/60 | HR 65 | Ht 63.0 in | Wt 175.0 lb

## 2011-11-18 DIAGNOSIS — I359 Nonrheumatic aortic valve disorder, unspecified: Secondary | ICD-10-CM

## 2011-11-18 DIAGNOSIS — E039 Hypothyroidism, unspecified: Secondary | ICD-10-CM

## 2011-11-18 DIAGNOSIS — E871 Hypo-osmolality and hyponatremia: Secondary | ICD-10-CM | POA: Diagnosis not present

## 2011-11-18 DIAGNOSIS — I119 Hypertensive heart disease without heart failure: Secondary | ICD-10-CM

## 2011-11-18 DIAGNOSIS — I358 Other nonrheumatic aortic valve disorders: Secondary | ICD-10-CM

## 2011-11-18 NOTE — Assessment & Plan Note (Signed)
The patient remains clinically euthyroid on current therapy.  TSH is normal.

## 2011-11-18 NOTE — Progress Notes (Signed)
[name] Date of Birth:  01-03-1946 [facility] [address] [place], [place]  [address] [phone]         Fax   [phone]  History of Present Illness: This pleasant 66 year old Caucasian female is seen for a scheduled followup office visit. As a history of polycythemia vera and a history of hypothyroidism to his history of PVCs in the past she has hypothyroidism and aortic valve sclerosis and essential hypertension. She has atypical chest pain and does not have any history of ischemic heart disease.  Since last visit she has had a new problem.  She thinks it may have started almost a year ago.  Her right knee he will suddenly almost gave out and she almost falls.  Her right thigh and knee start to shake.  At times her right arm has also been shaking and weak.  There is no associated pain.  She has not had a neurologic evaluation.  She is extremely anemic but this is normal for her after phlebotomy.  Current Outpatient Prescriptions  Medication Sig Dispense Refill  . albuterol-ipratropium (COMBIVENT) 18-103 MCG/ACT inhaler Inhale 2 puffs into the lungs as needed.        . ALPRAZolam (XANAX) 0.5 MG tablet Take 1 tablet (0.5 mg total) by mouth every 6 (six) hours as needed.  120 tablet  5  . aspirin 325 MG tablet Take 325 mg by mouth daily.        . bisoprolol (ZEBETA) 10 MG tablet Daily and as needed  60 tablet  12  . cholecalciferol (VITAMIN D) 1000 UNITS tablet Take 1,000 Units by mouth daily.        . cyclobenzaprine (FLEXERIL) 10 MG tablet Take 10 mg by mouth as needed.        . diphenhydrAMINE (BENADRYL) 25 MG tablet Take 25 mg by mouth as needed.        Marland Kitchen DOCUSATE SODIUM PO Take 1 tablet by mouth at bedtime.        Marland Kitchen estrogens, conjugated, (PREMARIN) 0.625 MG tablet Take 0.625 mg by mouth 2 (two) times daily.        . famotidine (PEPCID) 20 MG tablet TAKE 1 TABLET BY MOUTH TWICE A DAY  60 tablet  11  . fexofenadine (ALLEGRA) 180 MG tablet Take 180 mg by  mouth daily.        . fish oil-omega-3 fatty acids 1000 MG capsule Take 1 g by mouth daily.        . fluoxymesterone (ANDROXY) 10 MG tablet Take by mouth daily. Taking 1/4 of a 10 mg. Tab.  2.5 mg.      . furosemide (LASIX) 40 MG tablet       . Garlic Oil 1000 MG CAPS Take by mouth 1 dose over 24 hours.        . hydrochlorothiazide (MICROZIDE) 12.5 MG capsule TAKE 1 CAPSULE EVERY DAY  30 capsule  6  . lisinopril (PRINIVIL,ZESTRIL) 10 MG tablet Twice a day or as directed  60 tablet  11  . magnesium oxide (MAG-OX) 400 MG tablet Take 1,200 mg by mouth daily.        Marland Kitchen oxyCODONE (OXYCONTIN) 10 MG 12 hr tablet Take 1 tablet (10 mg total) by mouth every 12 (twelve) hours. Brand name medically necessary  60 tablet  0  . oxyCODONE (OXYCONTIN) 20 MG 12 hr tablet Take 1 tablet (20 mg total) by mouth every 8 (eight) hours.  90 tablet  0  .  PLAVIX 75 MG tablet TAKE 1 TABLET EVERY DAY  30 tablet  3  . potassium chloride (KLOR-CON) 10 MEQ CR tablet Take 80 mEq by mouth daily.       . promethazine (PHENERGAN) 25 MG tablet Take 25 mg by mouth as needed.        . Sennosides (SENNA LAX PO) Take 1 tablet by mouth at bedtime.        . temazepam (RESTORIL) 30 MG capsule Take 1 capsule (30 mg total) by mouth at bedtime as needed.  30 capsule  3  . thyroid (ARMOUR THYROID) 60 MG tablet Take 1 tablet (60 mg total) by mouth daily. 2 and 1/2 daily  75 tablet  11  . Valerian 100 MG CAPS Take by mouth daily after supper.      Marland Kitchen DISCONTD: furosemide (LASIX) 40 MG tablet TAKE 1 TABLET BY MOUTH TWICE A DAY  60 tablet  11  . nitroGLYCERIN (NITROSTAT) 0.4 MG SL tablet Place 1 tablet (0.4 mg total) under the tongue every 5 (five) minutes as needed for chest pain.  100 tablet  3  . oxyCODONE (ROXICODONE) 15 MG immediate release tablet Take 1 tablet (15 mg total) by mouth every 8 (eight) hours as needed for pain.  75 tablet  0    Allergies  Allergen Reactions  . Calcium Channel Blockers   . Codeine   . Penicillins   .  Synthroid (Levothyroxine Sodium)     Patient Active Problem List  Diagnosis  . Benign hypertensive heart disease without heart failure  . Aortic valve sclerosis  . Hypothyroidism  . Polycythemia vera  . Postmenopausal state  . Fibromyalgia  . Bronchitis    History  Smoking status  . Never Smoker   Smokeless tobacco  . Not on file    History  Alcohol Use No    Family History  Problem Relation Age of Onset  . Heart attack Father   . Coronary artery disease Mother     had aortic valve replacement    Review of Systems: Constitutional: no fever chills diaphoresis or fatigue or change in weight.  Head and neck: no hearing loss, no epistaxis, no photophobia or visual disturbance. Respiratory: No cough, shortness of breath or wheezing. Cardiovascular: No chest pain peripheral edema, palpitations. Gastrointestinal: No abdominal distention, no abdominal pain, no change in bowel habits hematochezia or melena. Genitourinary: No dysuria, no frequency, no urgency, no nocturia. Musculoskeletal:No arthralgias, no back pain, no gait disturbance or myalgias. Neurological: No dizziness, no headaches, no numbness, no seizures, no syncope, no weakness, no tremors. Hematologic: No lymphadenopathy, no easy bruising. Psychiatric: No confusion, no hallucinations, no sleep disturbance.    Physical Exam: Filed Vitals:   11/18/11 1133  BP: 120/60  Pulse: 65   the general appearance reveals a well-developed well-nourished woman in no distress.The head and neck exam reveals pupils equal and reactive.  Extraocular movements are full.  There is no scleral icterus.  The mouth and pharynx are normal.  The neck is supple.  The carotids reveal no bruits.  The jugular venous pressure is normal.  The  thyroid is not enlarged.  There is no lymphadenopathy.  The chest is clear to percussion and auscultation.  There are no rales or rhonchi.  Expansion of the chest is symmetrical.  The precordium is quiet.   The first heart sound is normal.  The second heart sound is physiologically split.  There is no murmur gallop rub or click.  There is  no abnormal lift or heave.  The abdomen is soft and nontender.  The bowel sounds are normal.  The liver and spleen are not enlarged.  There are no abdominal masses.  There are no abdominal bruits.  Extremities reveal good pedal pulses.  There is no phlebitis or edema.  There is no cyanosis or clubbing.  Strength is normal and symmetrical in all extremities.  There is no lateralizing weakness.  There are no sensory deficits.  Deep tendon reflexes appear to be decreased in the lower extremity.  There is no clonus.  The skin is warm and dry.  There is no rash.   Her electrocardiogram shows normal sinus rhythm and is within normal limits.   Assessment / Plan: The cause for her symptoms of intermittent right leg and right arm weakness is not readily apparent.  Her lab work is satisfactory acknowledging  the chronically low hemoglobin from her polycythemia rubra vera.  Her serum sodium is slightly low at 134 but I doubt if this is playing a significant role in her symptoms.  Nonetheless we will cut back on her furosemide 40 mg to just once a day.  We will refer her to [facility] for further evaluation of her neurologic symptoms Recheck here in 6 months for followup office visit lipid panel hepatic function panel and basal metabolic panel.

## 2011-11-18 NOTE — Patient Instructions (Addendum)
Decrease your Lasix to once daily  Your physician wants you to follow-up in: 6 months with fasting labs (lp/bmet/hfp)  You will receive a reminder letter in the mail two months in advance. If you don't receive a letter, please call our office to schedule the follow-up appointment.   Appointment with Dr Craige Cotta on 12/06/11 at 1:45   [facility] [address]  [address]  [place], [place] [address]

## 2011-11-18 NOTE — Assessment & Plan Note (Signed)
No dizziness.  No syncope.  No symptoms of CHF or angina pectoris.

## 2011-11-18 NOTE — Assessment & Plan Note (Signed)
Blood pressure has been satisfactory on current therapy.  No chest pain.  No increased dyspnea.  No recent palpitations.

## 2011-11-24 ENCOUNTER — Other Ambulatory Visit (HOSPITAL_BASED_OUTPATIENT_CLINIC_OR_DEPARTMENT_OTHER): Payer: Medicare Other | Admitting: Lab

## 2011-11-24 DIAGNOSIS — C8800 Waldenstrom macroglobulinemia not having achieved remission: Secondary | ICD-10-CM

## 2011-11-24 DIAGNOSIS — D45 Polycythemia vera: Secondary | ICD-10-CM

## 2011-11-24 LAB — CBC WITH DIFFERENTIAL (CANCER CENTER ONLY)
BASO%: 0 % (ref 0.0–2.0)
Eosinophils Absolute: 0.1 10*3/uL (ref 0.0–0.5)
HCT: 21.7 % — ABNORMAL LOW (ref 34.8–46.6)
HGB: 5.1 g/dL — CL (ref 11.6–15.9)
LYMPH#: 0.8 10*3/uL — ABNORMAL LOW (ref 0.9–3.3)
LYMPH%: 20 % (ref 14.0–48.0)
MCV: 65 fL — ABNORMAL LOW (ref 81–101)
MONO#: 0.7 10*3/uL (ref 0.1–0.9)
NEUT%: 60 % (ref 39.6–80.0)
RBC: 3.34 10*6/uL — ABNORMAL LOW (ref 3.70–5.32)
RDW: 21 % — ABNORMAL HIGH (ref 11.1–15.7)
WBC: 4 10*3/uL (ref 3.9–10.0)

## 2011-12-02 DIAGNOSIS — D235 Other benign neoplasm of skin of trunk: Secondary | ICD-10-CM | POA: Diagnosis not present

## 2011-12-02 DIAGNOSIS — Z85820 Personal history of malignant melanoma of skin: Secondary | ICD-10-CM | POA: Diagnosis not present

## 2011-12-02 DIAGNOSIS — D485 Neoplasm of uncertain behavior of skin: Secondary | ICD-10-CM | POA: Diagnosis not present

## 2011-12-03 ENCOUNTER — Other Ambulatory Visit: Payer: Self-pay | Admitting: Hematology & Oncology

## 2011-12-06 DIAGNOSIS — G609 Hereditary and idiopathic neuropathy, unspecified: Secondary | ICD-10-CM | POA: Diagnosis not present

## 2011-12-06 DIAGNOSIS — E119 Type 2 diabetes mellitus without complications: Secondary | ICD-10-CM | POA: Diagnosis not present

## 2011-12-06 DIAGNOSIS — R29898 Other symptoms and signs involving the musculoskeletal system: Secondary | ICD-10-CM | POA: Diagnosis not present

## 2011-12-08 ENCOUNTER — Other Ambulatory Visit: Payer: Self-pay | Admitting: *Deleted

## 2011-12-08 ENCOUNTER — Other Ambulatory Visit (HOSPITAL_BASED_OUTPATIENT_CLINIC_OR_DEPARTMENT_OTHER): Payer: Medicare Other | Admitting: Lab

## 2011-12-08 DIAGNOSIS — D45 Polycythemia vera: Secondary | ICD-10-CM

## 2011-12-08 DIAGNOSIS — M797 Fibromyalgia: Secondary | ICD-10-CM

## 2011-12-08 LAB — CBC WITH DIFFERENTIAL (CANCER CENTER ONLY)
BASO#: 0 10*3/uL (ref 0.0–0.2)
Eosinophils Absolute: 0.1 10*3/uL (ref 0.0–0.5)
HCT: 22.4 % — ABNORMAL LOW (ref 34.8–46.6)
HGB: 5.3 g/dL — CL (ref 11.6–15.9)
LYMPH#: 0.7 10*3/uL — ABNORMAL LOW (ref 0.9–3.3)
MCH: 14.9 pg — ABNORMAL LOW (ref 26.0–34.0)
MCHC: 23.7 g/dL — ABNORMAL LOW (ref 32.0–36.0)
MONO%: 15.3 % — ABNORMAL HIGH (ref 0.0–13.0)
NEUT#: 1.9 10*3/uL (ref 1.5–6.5)
NEUT%: 59.5 % (ref 39.6–80.0)
RBC: 3.55 10*6/uL — ABNORMAL LOW (ref 3.70–5.32)

## 2011-12-08 MED ORDER — OXYCODONE HCL 20 MG PO TB12: 20.0000 mg | ORAL_TABLET | Freq: Three times a day (TID) | ORAL | Status: DC

## 2011-12-08 MED ORDER — OXYCODONE HCL 10 MG PO TB12: 10.0000 mg | ORAL_TABLET | Freq: Two times a day (BID) | ORAL | Status: DC

## 2011-12-08 MED ORDER — OXYCODONE HCL 15 MG PO TABS: 15.0000 mg | ORAL_TABLET | Freq: Three times a day (TID) | ORAL | Status: DC | PRN

## 2011-12-08 NOTE — Telephone Encounter (Signed)
Pt left message asking to have her pain medications refilled while here for her lab appt.  Will route to Dr Ennever for approval and place at the front desk when ready. 

## 2011-12-15 DIAGNOSIS — G8190 Hemiplegia, unspecified affecting unspecified side: Secondary | ICD-10-CM | POA: Diagnosis not present

## 2011-12-22 ENCOUNTER — Other Ambulatory Visit (HOSPITAL_BASED_OUTPATIENT_CLINIC_OR_DEPARTMENT_OTHER): Payer: Medicare Other | Admitting: Lab

## 2011-12-22 ENCOUNTER — Ambulatory Visit (HOSPITAL_BASED_OUTPATIENT_CLINIC_OR_DEPARTMENT_OTHER): Payer: Medicare Other | Admitting: Hematology & Oncology

## 2011-12-22 VITALS — BP 116/41 | HR 61 | Temp 98.0°F | Resp 18 | Ht 63.0 in | Wt 178.0 lb

## 2011-12-22 DIAGNOSIS — C880 Waldenstrom macroglobulinemia: Secondary | ICD-10-CM | POA: Diagnosis not present

## 2011-12-22 DIAGNOSIS — D45 Polycythemia vera: Secondary | ICD-10-CM | POA: Diagnosis not present

## 2011-12-22 LAB — CBC WITH DIFFERENTIAL (CANCER CENTER ONLY)
BASO#: 0 10*3/uL (ref 0.0–0.2)
Eosinophils Absolute: 0.1 10*3/uL (ref 0.0–0.5)
HCT: 23.7 % — ABNORMAL LOW (ref 34.8–46.6)
HGB: 5.8 g/dL — CL (ref 11.6–15.9)
LYMPH#: 0.6 10*3/uL — ABNORMAL LOW (ref 0.9–3.3)
MCHC: 24.5 g/dL — ABNORMAL LOW (ref 32.0–36.0)
MONO#: 0.4 10*3/uL (ref 0.1–0.9)
NEUT#: 2 10*3/uL (ref 1.5–6.5)
NEUT%: 63.7 % (ref 39.6–80.0)
RBC: 3.8 10*6/uL (ref 3.70–5.32)

## 2011-12-22 NOTE — Progress Notes (Signed)
This office note has been dictated.

## 2011-12-22 NOTE — Patient Instructions (Signed)
call us with problems

## 2011-12-23 DIAGNOSIS — R209 Unspecified disturbances of skin sensation: Secondary | ICD-10-CM | POA: Diagnosis not present

## 2011-12-23 DIAGNOSIS — M5020 Other cervical disc displacement, unspecified cervical region: Secondary | ICD-10-CM | POA: Diagnosis not present

## 2011-12-23 DIAGNOSIS — M542 Cervicalgia: Secondary | ICD-10-CM | POA: Diagnosis not present

## 2011-12-23 DIAGNOSIS — G5600 Carpal tunnel syndrome, unspecified upper limb: Secondary | ICD-10-CM | POA: Diagnosis not present

## 2011-12-23 NOTE — Progress Notes (Signed)
CC:   [name], M.D. [facility], Fax [phone]  DIAGNOSES: 1. Polycythemia vera, hyperviscosity variant. 2. Severe fibromyalgia.  CURRENT THERAPY:  Phlebotomy to maintain hemoglobin below 6.  INTERIM HISTORY:  [name] comes in for followup.  She is doing okay. She saw a neurologist, I think at [facility].  Apparently, she was found have a low B12 level.  It was recommended that she start some vitamin B12.  [name] did not want start vitamin B12 until she saw me. She is worried that her vitamin B12 would make her hemoglobin go up too quickly.  I reassured her that I did not think that it would given that she is markedly iron deficient.  I told her to do the vitamin B12 weekly for now and then go once a month after 4 treatments.  Again, I think that she will be okay with B12 replacement.  I do not see that this is going to affect her hemoglobin to any great degree.  Her fibromyalgia is bothering her quite a bit.  This does seem to happen with the change in seasons.  She is on OxyContin and oxycodone for this, which does keep her quite functional.  She has had no problems with her bowels or bladder.  She has had no leg swelling.  She has had no cough.  There is no dysphagia or odynophagia.  PHYSICAL EXAMINATION:  This is a well-developed, well-nourished white female who is quite pale.  Vital signs:  Temperature of 98, pulse 61, respiratory rate 18, blood pressure 116/41.  Weight is 178.  Head and neck:  Normocephalic, atraumatic skull.  There are no ocular or oral lesions.  There are no palpable cervical or supraclavicular lymph nodes. Lungs:  Clear bilaterally.  Cardiac:  Regular rate and rhythm with a normal S1 and S2.  There are no murmurs, rubs or bruits.  Abdomen:  Soft with good bowel sounds.  There is no palpable abdominal mass.  There is no fluid wave.  There is no palpable hepatosplenomegaly.  Back:  No tenderness over the spine, ribs or hips.   Extremities:  No clubbing, cyanosis or edema.  She does have some tenderness over the muscles in her thighs and upper arms.  Skin:  Shows the skin to be quite pale.  No rashes are noted.  Neurologic:  No focal neurological deficits.  She still may have a little bit of an, I think, resting tremor.  LABORATORY STUDIES:  White cell count of 3.1, hemoglobin 5.8, hematocrit 23.7, platelet count 192.  IMPRESSION:  [name] is a 66 year old white female with polycythemia. She has a hyperviscosity variant.  She does not need to be phlebotomized right now.  She has done pretty well with this, actually.  Again, we will see if the vitamin B12 has an adverse effect on her blood counts.  We have her blood checked every 2 weeks.  I will plan to see her back myself in 2 months.  Again, she comes in every 2 weeks for a followup CBC. She is markedly iron deficient, so again I would be shocked if she had issues with her hemoglobin going up quickly.  ADDENDUM:  [name] says she is supposed to have EMG studies done.  I do not see any problems with her having this.    ______________________________ Josph Macho, M.D. PRE/MEDQ  D:  12/22/2011  T:  12/23/2011  Job:  [id number]

## 2012-01-05 ENCOUNTER — Other Ambulatory Visit: Payer: Self-pay | Admitting: *Deleted

## 2012-01-05 ENCOUNTER — Other Ambulatory Visit (HOSPITAL_BASED_OUTPATIENT_CLINIC_OR_DEPARTMENT_OTHER): Payer: Medicare Other | Admitting: Lab

## 2012-01-05 DIAGNOSIS — D45 Polycythemia vera: Secondary | ICD-10-CM | POA: Diagnosis not present

## 2012-01-05 DIAGNOSIS — M797 Fibromyalgia: Secondary | ICD-10-CM

## 2012-01-05 LAB — CBC WITH DIFFERENTIAL (CANCER CENTER ONLY)
BASO#: 0 10*3/uL (ref 0.0–0.2)
EOS%: 3.1 % (ref 0.0–7.0)
Eosinophils Absolute: 0.1 10*3/uL (ref 0.0–0.5)
HCT: 24.4 % — ABNORMAL LOW (ref 34.8–46.6)
HGB: 5.9 g/dL — CL (ref 11.6–15.9)
LYMPH%: 16 % (ref 14.0–48.0)
MCH: 15 pg — ABNORMAL LOW (ref 26.0–34.0)
MCHC: 24.2 g/dL — ABNORMAL LOW (ref 32.0–36.0)
MCV: 62 fL — ABNORMAL LOW (ref 81–101)
MONO%: 14.1 % — ABNORMAL HIGH (ref 0.0–13.0)
NEUT#: 2.8 10*3/uL (ref 1.5–6.5)
NEUT%: 66.8 % (ref 39.6–80.0)
RBC: 3.94 10*6/uL (ref 3.70–5.32)

## 2012-01-05 MED ORDER — OXYCODONE HCL 10 MG PO TB12: 10.0000 mg | ORAL_TABLET | Freq: Two times a day (BID) | ORAL | Status: DC

## 2012-01-05 MED ORDER — OXYCODONE HCL 20 MG PO TB12: 20.0000 mg | ORAL_TABLET | Freq: Three times a day (TID) | ORAL | Status: DC

## 2012-01-05 MED ORDER — OXYCODONE HCL 15 MG PO TABS: 15.0000 mg | ORAL_TABLET | Freq: Three times a day (TID) | ORAL | Status: DC | PRN

## 2012-01-05 NOTE — Telephone Encounter (Signed)
Pt left message asking to have her pain medications refilled while here for her lab appt.  Will route to Dr [name] for approval and place at the front desk when ready.

## 2012-01-06 ENCOUNTER — Other Ambulatory Visit: Payer: Self-pay | Admitting: Cardiology

## 2012-01-06 DIAGNOSIS — G4700 Insomnia, unspecified: Secondary | ICD-10-CM

## 2012-01-06 DIAGNOSIS — N390 Urinary tract infection, site not specified: Secondary | ICD-10-CM

## 2012-01-06 DIAGNOSIS — J329 Chronic sinusitis, unspecified: Secondary | ICD-10-CM

## 2012-01-06 NOTE — Telephone Encounter (Signed)
[name]  Could you look at this refill not sure if we refill this meds. [name]

## 2012-01-12 DIAGNOSIS — M4712 Other spondylosis with myelopathy, cervical region: Secondary | ICD-10-CM | POA: Diagnosis not present

## 2012-01-12 DIAGNOSIS — M4802 Spinal stenosis, cervical region: Secondary | ICD-10-CM | POA: Diagnosis not present

## 2012-01-12 DIAGNOSIS — M5000 Cervical disc disorder with myelopathy, unspecified cervical region: Secondary | ICD-10-CM | POA: Diagnosis not present

## 2012-01-17 ENCOUNTER — Other Ambulatory Visit: Payer: Self-pay | Admitting: *Deleted

## 2012-01-17 NOTE — Progress Notes (Signed)
Pt called stating that she thinks she will need a phlebotomy this week. She is starting to feel run down and is having headaches ( her usual symptoms when her hgb goes above 6.). Coming in for scheduled labs on 10/23. Asked to be scheduled for a phlebotomy on 01/20/12 at 1300 if her counts permit. Request sent to scheduling to have this appt set up.

## 2012-01-19 ENCOUNTER — Other Ambulatory Visit (HOSPITAL_BASED_OUTPATIENT_CLINIC_OR_DEPARTMENT_OTHER): Payer: Medicare Other | Admitting: Lab

## 2012-01-19 DIAGNOSIS — G609 Hereditary and idiopathic neuropathy, unspecified: Secondary | ICD-10-CM | POA: Diagnosis not present

## 2012-01-19 DIAGNOSIS — D509 Iron deficiency anemia, unspecified: Secondary | ICD-10-CM

## 2012-01-19 DIAGNOSIS — H40059 Ocular hypertension, unspecified eye: Secondary | ICD-10-CM | POA: Diagnosis not present

## 2012-01-19 DIAGNOSIS — H2589 Other age-related cataract: Secondary | ICD-10-CM | POA: Diagnosis not present

## 2012-01-19 DIAGNOSIS — D45 Polycythemia vera: Secondary | ICD-10-CM

## 2012-01-19 LAB — CBC WITH DIFFERENTIAL (CANCER CENTER ONLY)
BASO%: 0.2 % (ref 0.0–2.0)
EOS%: 2.5 % (ref 0.0–7.0)
HCT: 25.9 % — ABNORMAL LOW (ref 34.8–46.6)
LYMPH#: 0.8 10*3/uL — ABNORMAL LOW (ref 0.9–3.3)
LYMPH%: 16.6 % (ref 14.0–48.0)
MCHC: 24.3 g/dL — ABNORMAL LOW (ref 32.0–36.0)
MCV: 62 fL — ABNORMAL LOW (ref 81–101)
NEUT%: 66.6 % (ref 39.6–80.0)
RDW: 22.8 % — ABNORMAL HIGH (ref 11.1–15.7)

## 2012-01-20 ENCOUNTER — Ambulatory Visit (HOSPITAL_BASED_OUTPATIENT_CLINIC_OR_DEPARTMENT_OTHER): Payer: Medicare Other

## 2012-01-20 ENCOUNTER — Other Ambulatory Visit: Payer: Self-pay | Admitting: *Deleted

## 2012-01-20 VITALS — BP 105/57 | HR 74 | Temp 98.6°F | Resp 20

## 2012-01-20 DIAGNOSIS — D45 Polycythemia vera: Secondary | ICD-10-CM

## 2012-01-20 MED ORDER — LIDOCAINE-PRILOCAINE 2.5-2.5 % EX CREA: TOPICAL_CREAM | CUTANEOUS | Status: DC | PRN

## 2012-01-20 NOTE — Progress Notes (Signed)
[name] presents today for phlebotomy per MD orders. Phlebotomy procedure started at 1300 and ended at1315 500 grams removed. Patient observed for 30 minutes after procedure without any incident. Patient tolerated procedure well. IV needle removed intact.   

## 2012-01-20 NOTE — Patient Instructions (Addendum)
Therapeutic Phlebotomy Therapeutic phlebotomy is the controlled removal of blood from your body for the purpose of treating a medical condition. It is similar to donating blood. Usually, about a pint (470 mL) of blood is removed. The average adult has 9 to 12 pints (4.3 to 5.7 L) of blood. Therapeutic phlebotomy may be used to treat the following medical conditions:  Hemochromatosis. This is a condition in which there is too much iron in the blood.  Polycythemia vera. This is a condition in which there are too many red cells in the blood.  Porphyria cutanea tarda. This is a disease usually passed from one generation to the next (inherited). It is a condition in which an important part of hemoglobin is not made properly. This results in the build up of abnormal amounts of porphyrins in the body.  Sickle cell disease. This is an inherited disease. It is a condition in which the red blood cells form an abnormal crescent shape rather than a round shape. LET YOUR CAREGIVER KNOW ABOUT:  Allergies.  Medicines taken including herbs, eyedrops, over-the-counter medicines, and creams.  Use of steroids (by mouth or creams).  Previous problems with anesthetics or numbing medicine.  History of blood clots.  History of bleeding or blood problems.  Previous surgery.  Possibility of pregnancy, if this applies. RISKS AND COMPLICATIONS This is a simple and safe procedure. Problems are unlikely. However, problems can occur and may include:  Nausea or lightheadedness.  Low blood pressure.  Soreness, bleeding, swelling, or bruising at the needle insertion site.  Infection. BEFORE THE PROCEDURE  This is a procedure that can be done as an outpatient. Confirm the time that you need to arrive for your procedure. Confirm whether there is a need to fast or withhold any medications. It is helpful to wear clothing with sleeves that can be raised above the elbow. A blood sample may be done to determine the  amount of red blood cells or iron in your blood. Plan ahead of time to have someone drive you home after the procedure. PROCEDURE The entire procedure from preparation through recovery takes about 1 hour. The actual collection takes about 10 to 15 minutes.  A needle will be inserted into your vein.  Tubing and a collection bag will be attached to that needle.  Blood will flow through the needle and tubing into the collection bag.  You may be asked to open and close your hand slowly and continuously during the entire collection.  Once the specified amount of blood has been removed from your body, the collection bag and tubing will be clamped.  The needle will be removed.  Pressure will be held on the site of the needle insertion to stop the bleeding. Then a bandage will be placed over the needle insertion site. AFTER THE PROCEDURE  Your recovery will be assessed and monitored. If there are no problems, as an outpatient, you should be able to go home shortly after the procedure.  Document Released: 08/17/2010 Document Revised: 06/07/2011 Document Reviewed: 08/17/2010 [employer] Patient Information 2013 [employer].  

## 2012-02-02 ENCOUNTER — Other Ambulatory Visit: Payer: Self-pay | Admitting: *Deleted

## 2012-02-02 ENCOUNTER — Other Ambulatory Visit (HOSPITAL_BASED_OUTPATIENT_CLINIC_OR_DEPARTMENT_OTHER): Payer: Medicare Other | Admitting: Lab

## 2012-02-02 DIAGNOSIS — C880 Waldenstrom macroglobulinemia: Secondary | ICD-10-CM | POA: Diagnosis not present

## 2012-02-02 DIAGNOSIS — M797 Fibromyalgia: Secondary | ICD-10-CM

## 2012-02-02 DIAGNOSIS — D45 Polycythemia vera: Secondary | ICD-10-CM | POA: Diagnosis not present

## 2012-02-02 LAB — CBC WITH DIFFERENTIAL (CANCER CENTER ONLY)
BASO%: 0.3 % (ref 0.0–2.0)
EOS%: 2.8 % (ref 0.0–7.0)
LYMPH#: 0.7 10*3/uL — ABNORMAL LOW (ref 0.9–3.3)
MCHC: 24 g/dL — ABNORMAL LOW (ref 32.0–36.0)
NEUT#: 2.6 10*3/uL (ref 1.5–6.5)
Platelets: 233 10*3/uL (ref 145–400)
RDW: 22.8 % — ABNORMAL HIGH (ref 11.1–15.7)

## 2012-02-02 MED ORDER — OXYCODONE HCL 10 MG PO TB12: 10.0000 mg | ORAL_TABLET | Freq: Two times a day (BID) | ORAL | Status: DC

## 2012-02-02 MED ORDER — OXYCODONE HCL 20 MG PO TB12: 20.0000 mg | ORAL_TABLET | Freq: Three times a day (TID) | ORAL | Status: DC

## 2012-02-02 MED ORDER — ROXICODONE 15 MG PO TABS: 15.0000 mg | ORAL_TABLET | Freq: Three times a day (TID) | ORAL | Status: DC | PRN

## 2012-02-02 NOTE — Telephone Encounter (Signed)
Pt left message asking to have her pain medications refilled while here for her lab appt.  Will route to Dr Ennever for approval and place at the front desk when ready. 

## 2012-02-07 ENCOUNTER — Other Ambulatory Visit: Payer: Self-pay | Admitting: Hematology & Oncology

## 2012-02-16 ENCOUNTER — Ambulatory Visit (HOSPITAL_BASED_OUTPATIENT_CLINIC_OR_DEPARTMENT_OTHER): Payer: Medicare Other | Admitting: Hematology & Oncology

## 2012-02-16 ENCOUNTER — Other Ambulatory Visit (HOSPITAL_BASED_OUTPATIENT_CLINIC_OR_DEPARTMENT_OTHER): Payer: Medicare Other | Admitting: Lab

## 2012-02-16 VITALS — BP 129/40 | HR 65 | Temp 97.8°F | Resp 18 | Ht 63.0 in | Wt 176.0 lb

## 2012-02-16 DIAGNOSIS — D45 Polycythemia vera: Secondary | ICD-10-CM

## 2012-02-16 LAB — CBC WITH DIFFERENTIAL (CANCER CENTER ONLY)
BASO#: 0 10*3/uL (ref 0.0–0.2)
EOS%: 2.3 % (ref 0.0–7.0)
HCT: 26 % — ABNORMAL LOW (ref 34.8–46.6)
HGB: 6.2 g/dL — CL (ref 11.6–15.9)
LYMPH%: 11.9 % — ABNORMAL LOW (ref 14.0–48.0)
MCH: 15.2 pg — ABNORMAL LOW (ref 26.0–34.0)
MCHC: 23.8 g/dL — ABNORMAL LOW (ref 32.0–36.0)
MCV: 64 fL — ABNORMAL LOW (ref 81–101)
MONO%: 9.8 % (ref 0.0–13.0)
NEUT#: 4 10*3/uL (ref 1.5–6.5)
NEUT%: 75.8 % (ref 39.6–80.0)

## 2012-02-16 NOTE — Progress Notes (Signed)
This office note has been dictated.

## 2012-02-17 ENCOUNTER — Ambulatory Visit (HOSPITAL_BASED_OUTPATIENT_CLINIC_OR_DEPARTMENT_OTHER): Payer: Medicare Other

## 2012-02-17 DIAGNOSIS — D45 Polycythemia vera: Secondary | ICD-10-CM | POA: Diagnosis not present

## 2012-02-17 NOTE — Progress Notes (Signed)
[name] presents today for phlebotomy per MD orders. Phlebotomy procedure started at 1210 and ended at 1225. 500 grams removed. Patient observed for 30 minutes after procedure without any incident. Patient tolerated procedure well. IV needle removed intact.

## 2012-02-17 NOTE — Progress Notes (Signed)
CC:   [name], M.D. Clydene Fake, M.D.  DIAGNOSES: 1. Polycythemia vera-hyperviscosity variant. 2. Severe fibromyalgia.  CURRENT THERAPY:  Phlebotomy to maintain hemoglobin less than 6.  INTERIM HISTORY:  Ms. Chiarenza comes in for her followup.  Unfortunately, she now has neck issues.  She was seen by Dr. Phoebe Perch of Neurosurgery. She did have an MRI done.  This does show significant stenotic changes. Thankfully, there is nothing that would really require surgery.  She seems to be doing okay with this.  She is bothered by the fibromyalgia.  This has been a real problem for her for quite a while.  She has had no weakness.  Occasionally she would feel her legs "buckle."  Her iron is nonexistent from the phlebotomies.  She has had no change in bowel or bladder habits.  There has been no chest pain.  There has been no cough.  She has been on Halotestin.  She has cut the dose back by 75%.  She feels this may be helpful her decrease her frequency of phlebotomies.  PHYSICAL EXAMINATION:  General:  This is a pale-appearing white female in no obvious distress.  Vital signs:  97.8, pulse 65, respiratory rate 18, blood pressure 129/40.  Weight is 176.  Head and neck: Normocephalic, atraumatic skull.  There are no ocular or oral lesions. There are no palpable cervical or supraclavicular lymph nodes.  Lungs: Clear bilaterally.  Cardiac:  Regular rate and rhythm with a normal S1 and S2.  There are no murmurs, rubs, or bruits.  Abdomen:  Soft with good bowel sounds.  There is no palpable abdominal mass.  There is no fluid wave.  There is no palpable hepatosplenomegaly.  Back:  No tenderness over the spine, ribs, or hips.  Extremities:  No clubbing, cyanosis, or edema.  Neurologic:  No focal neurological deficits.  LABORATORY STUDIES:  White cell count 5.3, hemoglobin 6.2, hematocrit 26, platelet count 220.  MCV is 64.  IMPRESSION:  Ms. Klecha is a 66 year old female with polycythemia  vera. Again, she is best functional with a hemoglobin less than 6.  We will go ahead and set her up with a phlebotomy tomorrow.  From my point of view, I do not see any problem with her having surgery for her neck, if necessary.  Her body and cardiac status are basically used to her hemoglobin being low.  As such, I think any additional stress of surgery would not be too difficult for her to manage.  We will continue to follow her blood counts every 2 weeks.  I will plan to see her back myself in another 2 months.    ______________________________ Josph Macho, M.D. PRE/MEDQ  D:  02/16/2012  T:  02/17/2012  Job:  [id number]

## 2012-02-17 NOTE — Patient Instructions (Addendum)
Therapeutic Phlebotomy Therapeutic phlebotomy is the controlled removal of blood from your body for the purpose of treating a medical condition. It is similar to donating blood. Usually, about a pint (470 mL) of blood is removed. The average adult has 9 to 12 pints (4.3 to 5.7 L) of blood. Therapeutic phlebotomy may be used to treat the following medical conditions:  Hemochromatosis. This is a condition in which there is too much iron in the blood.  Polycythemia vera. This is a condition in which there are too many red cells in the blood.  Porphyria cutanea tarda. This is a disease usually passed from one generation to the next (inherited). It is a condition in which an important part of hemoglobin is not made properly. This results in the build up of abnormal amounts of porphyrins in the body.  Sickle cell disease. This is an inherited disease. It is a condition in which the red blood cells form an abnormal crescent shape rather than a round shape. LET YOUR CAREGIVER KNOW ABOUT:  Allergies.  Medicines taken including herbs, eyedrops, over-the-counter medicines, and creams.  Use of steroids (by mouth or creams).  Previous problems with anesthetics or numbing medicine.  History of blood clots.  History of bleeding or blood problems.  Previous surgery.  Possibility of pregnancy, if this applies. RISKS AND COMPLICATIONS This is a simple and safe procedure. Problems are unlikely. However, problems can occur and may include:  Nausea or lightheadedness.  Low blood pressure.  Soreness, bleeding, swelling, or bruising at the needle insertion site.  Infection. BEFORE THE PROCEDURE  This is a procedure that can be done as an outpatient. Confirm the time that you need to arrive for your procedure. Confirm whether there is a need to fast or withhold any medications. It is helpful to wear clothing with sleeves that can be raised above the elbow. A blood sample may be done to determine the  amount of red blood cells or iron in your blood. Plan ahead of time to have someone drive you home after the procedure. PROCEDURE The entire procedure from preparation through recovery takes about 1 hour. The actual collection takes about 10 to 15 minutes.  A needle will be inserted into your vein.  Tubing and a collection bag will be attached to that needle.  Blood will flow through the needle and tubing into the collection bag.  You may be asked to open and close your hand slowly and continuously during the entire collection.  Once the specified amount of blood has been removed from your body, the collection bag and tubing will be clamped.  The needle will be removed.  Pressure will be held on the site of the needle insertion to stop the bleeding. Then a bandage will be placed over the needle insertion site. AFTER THE PROCEDURE  Your recovery will be assessed and monitored. If there are no problems, as an outpatient, you should be able to go home shortly after the procedure.  Document Released: 08/17/2010 Document Revised: 06/07/2011 Document Reviewed: 08/17/2010 [employer] Patient Information 2013 [employer].  

## 2012-02-25 ENCOUNTER — Telehealth: Payer: Self-pay | Admitting: Cardiology

## 2012-02-25 DIAGNOSIS — E876 Hypokalemia: Secondary | ICD-10-CM

## 2012-02-25 NOTE — Telephone Encounter (Signed)
New message:  Needs a pres. For potassium called into [facility]  [phone].  Please call in today if possible.  She has been without for several days and pharmacy states they have faxed with no reply.

## 2012-02-28 MED ORDER — POTASSIUM CHLORIDE ER 10 MEQ PO TBCR: EXTENDED_RELEASE_TABLET | ORAL | Status: DC

## 2012-02-28 NOTE — Telephone Encounter (Signed)
[facility] 

## 2012-03-01 ENCOUNTER — Other Ambulatory Visit (HOSPITAL_BASED_OUTPATIENT_CLINIC_OR_DEPARTMENT_OTHER): Payer: Medicare Other | Admitting: Lab

## 2012-03-01 ENCOUNTER — Other Ambulatory Visit: Payer: Self-pay | Admitting: *Deleted

## 2012-03-01 DIAGNOSIS — M797 Fibromyalgia: Secondary | ICD-10-CM

## 2012-03-01 DIAGNOSIS — D45 Polycythemia vera: Secondary | ICD-10-CM | POA: Diagnosis not present

## 2012-03-01 DIAGNOSIS — J40 Bronchitis, not specified as acute or chronic: Secondary | ICD-10-CM

## 2012-03-01 LAB — CBC WITH DIFFERENTIAL (CANCER CENTER ONLY)
BASO#: 0 10*3/uL (ref 0.0–0.2)
BASO%: 0.3 % (ref 0.0–2.0)
EOS%: 2.9 % (ref 0.0–7.0)
HCT: 22.8 % — ABNORMAL LOW (ref 34.8–46.6)
HGB: 5.4 g/dL — CL (ref 11.6–15.9)
LYMPH%: 22.2 % (ref 14.0–48.0)
MCH: 15.2 pg — ABNORMAL LOW (ref 26.0–34.0)
MCHC: 23.7 g/dL — ABNORMAL LOW (ref 32.0–36.0)
MCV: 64 fL — ABNORMAL LOW (ref 81–101)
NEUT%: 60.6 % (ref 39.6–80.0)
RDW: 22.9 % — ABNORMAL HIGH (ref 11.1–15.7)

## 2012-03-01 MED ORDER — ROXICODONE 15 MG PO TABS: 15.0000 mg | ORAL_TABLET | Freq: Three times a day (TID) | ORAL | Status: DC | PRN

## 2012-03-01 MED ORDER — OXYCODONE HCL 20 MG PO TB12: 20.0000 mg | ORAL_TABLET | Freq: Three times a day (TID) | ORAL | Status: DC

## 2012-03-01 MED ORDER — OXYCODONE HCL 10 MG PO TB12: 10.0000 mg | ORAL_TABLET | Freq: Two times a day (BID) | ORAL | Status: DC

## 2012-03-01 MED ORDER — CEPHALEXIN 500 MG PO CAPS: 500.0000 mg | ORAL_CAPSULE | Freq: Two times a day (BID) | ORAL | Status: DC

## 2012-03-01 NOTE — Telephone Encounter (Signed)
Pt left message asking to have her pain medications and Keflex refilled while here for her lab appt.  Will route to Dr [name] for approval and place at the front desk when ready.

## 2012-03-02 ENCOUNTER — Telehealth: Payer: Self-pay | Admitting: Cardiology

## 2012-03-02 NOTE — Telephone Encounter (Signed)
Gave her Dr Con Memos name and number

## 2012-03-02 NOTE — Telephone Encounter (Signed)
PT CALLING RE A LETTER FROM [employer] , SHE NEEDS TO TALK WITH MELINDA RE A PCP, PLS CALL [phone] (HAVE TO DIAL [phone])

## 2012-03-06 ENCOUNTER — Other Ambulatory Visit: Payer: Self-pay | Admitting: Hematology & Oncology

## 2012-03-08 ENCOUNTER — Other Ambulatory Visit: Payer: Self-pay

## 2012-03-08 DIAGNOSIS — M545 Low back pain: Secondary | ICD-10-CM | POA: Diagnosis not present

## 2012-03-08 MED ORDER — HYDROCHLOROTHIAZIDE 12.5 MG PO CAPS: 12.5000 mg | ORAL_CAPSULE | Freq: Every day | ORAL | Status: DC

## 2012-03-15 ENCOUNTER — Other Ambulatory Visit: Payer: Medicare Other | Admitting: Lab

## 2012-03-15 DIAGNOSIS — D45 Polycythemia vera: Secondary | ICD-10-CM

## 2012-03-15 LAB — CBC WITH DIFFERENTIAL (CANCER CENTER ONLY)
BASO#: 0 10*3/uL (ref 0.0–0.2)
BASO%: 0.2 % (ref 0.0–2.0)
EOS%: 2.4 % (ref 0.0–7.0)
HCT: 25.4 % — ABNORMAL LOW (ref 34.8–46.6)
HGB: 6 g/dL — CL (ref 11.6–15.9)
LYMPH#: 0.6 10*3/uL — ABNORMAL LOW (ref 0.9–3.3)
MCHC: 23.6 g/dL — ABNORMAL LOW (ref 32.0–36.0)
MONO#: 0.6 10*3/uL (ref 0.1–0.9)
NEUT#: 2.9 10*3/uL (ref 1.5–6.5)
NEUT%: 69.5 % (ref 39.6–80.0)
WBC: 4.2 10*3/uL (ref 3.9–10.0)

## 2012-03-16 ENCOUNTER — Ambulatory Visit: Payer: Medicare Other

## 2012-03-16 VITALS — BP 131/57 | HR 66 | Temp 97.0°F | Resp 20

## 2012-03-16 DIAGNOSIS — D45 Polycythemia vera: Secondary | ICD-10-CM | POA: Diagnosis not present

## 2012-03-16 NOTE — Patient Instructions (Addendum)
Therapeutic Phlebotomy Therapeutic phlebotomy is the controlled removal of blood from your body for the purpose of treating a medical condition. It is similar to donating blood. Usually, about a pint (470 mL) of blood is removed. The average adult has 9 to 12 pints (4.3 to 5.7 L) of blood. Therapeutic phlebotomy may be used to treat the following medical conditions:  Hemochromatosis. This is a condition in which there is too much iron in the blood.  Polycythemia vera. This is a condition in which there are too many red cells in the blood.  Porphyria cutanea tarda. This is a disease usually passed from one generation to the next (inherited). It is a condition in which an important part of hemoglobin is not made properly. This results in the build up of abnormal amounts of porphyrins in the body.  Sickle cell disease. This is an inherited disease. It is a condition in which the red blood cells form an abnormal crescent shape rather than a round shape. LET YOUR CAREGIVER KNOW ABOUT:  Allergies.  Medicines taken including herbs, eyedrops, over-the-counter medicines, and creams.  Use of steroids (by mouth or creams).  Previous problems with anesthetics or numbing medicine.  History of blood clots.  History of bleeding or blood problems.  Previous surgery.  Possibility of pregnancy, if this applies. RISKS AND COMPLICATIONS This is a simple and safe procedure. Problems are unlikely. However, problems can occur and may include:  Nausea or lightheadedness.  Low blood pressure.  Soreness, bleeding, swelling, or bruising at the needle insertion site.  Infection. BEFORE THE PROCEDURE  This is a procedure that can be done as an outpatient. Confirm the time that you need to arrive for your procedure. Confirm whether there is a need to fast or withhold any medications. It is helpful to wear clothing with sleeves that can be raised above the elbow. A blood sample may be done to determine the  amount of red blood cells or iron in your blood. Plan ahead of time to have someone drive you home after the procedure. PROCEDURE The entire procedure from preparation through recovery takes about 1 hour. The actual collection takes about 10 to 15 minutes.  A needle will be inserted into your vein.  Tubing and a collection bag will be attached to that needle.  Blood will flow through the needle and tubing into the collection bag.  You may be asked to open and close your hand slowly and continuously during the entire collection.  Once the specified amount of blood has been removed from your body, the collection bag and tubing will be clamped.  The needle will be removed.  Pressure will be held on the site of the needle insertion to stop the bleeding. Then a bandage will be placed over the needle insertion site. AFTER THE PROCEDURE  Your recovery will be assessed and monitored. If there are no problems, as an outpatient, you should be able to go home shortly after the procedure.  Document Released: 08/17/2010 Document Revised: 06/07/2011 Document Reviewed: 08/17/2010 [employer] Patient Information 2013 [employer].  

## 2012-03-16 NOTE — Progress Notes (Signed)
[name] presents today for phlebotomy per MD orders. Phlebotomy procedure started at 1100 and ended at 1115. 500 grams removed. Patient observed for 30 minutes after procedure without any incident. Patient tolerated procedure well. IV needle removed intact.

## 2012-03-30 ENCOUNTER — Ambulatory Visit (HOSPITAL_BASED_OUTPATIENT_CLINIC_OR_DEPARTMENT_OTHER): Payer: Medicare Other | Admitting: Lab

## 2012-03-30 ENCOUNTER — Other Ambulatory Visit: Payer: Self-pay | Admitting: *Deleted

## 2012-03-30 DIAGNOSIS — D45 Polycythemia vera: Secondary | ICD-10-CM

## 2012-03-30 DIAGNOSIS — J40 Bronchitis, not specified as acute or chronic: Secondary | ICD-10-CM

## 2012-03-30 DIAGNOSIS — M797 Fibromyalgia: Secondary | ICD-10-CM

## 2012-03-30 LAB — CBC WITH DIFFERENTIAL (CANCER CENTER ONLY)
BASO#: 0 10*3/uL (ref 0.0–0.2)
Eosinophils Absolute: 0.2 10*3/uL (ref 0.0–0.5)
HCT: 23.1 % — ABNORMAL LOW (ref 34.8–46.6)
HGB: 5.5 g/dL — CL (ref 11.6–15.9)
LYMPH#: 0.8 10*3/uL — ABNORMAL LOW (ref 0.9–3.3)
MCHC: 23.8 g/dL — ABNORMAL LOW (ref 32.0–36.0)
NEUT#: 3 10*3/uL (ref 1.5–6.5)
NEUT%: 66.3 % (ref 39.6–80.0)
RBC: 3.57 10*6/uL — ABNORMAL LOW (ref 3.70–5.32)

## 2012-03-30 MED ORDER — OXYCODONE HCL 10 MG PO TB12: 10.0000 mg | ORAL_TABLET | Freq: Two times a day (BID) | ORAL | Status: DC

## 2012-03-30 MED ORDER — CEPHALEXIN 500 MG PO CAPS: 500.0000 mg | ORAL_CAPSULE | Freq: Two times a day (BID) | ORAL | Status: DC

## 2012-03-30 MED ORDER — OXYCODONE HCL 20 MG PO TB12: 20.0000 mg | ORAL_TABLET | Freq: Three times a day (TID) | ORAL | Status: DC

## 2012-03-30 MED ORDER — ROXICODONE 15 MG PO TABS: 15.0000 mg | ORAL_TABLET | Freq: Three times a day (TID) | ORAL | Status: DC | PRN

## 2012-03-30 NOTE — Telephone Encounter (Signed)
Pt left message asking to have her pain medications refilled while here for her lab appt. Last filled 03/01/12. Will route to Dr [name] for approval and place at the front desk when ready.

## 2012-04-12 ENCOUNTER — Other Ambulatory Visit (HOSPITAL_BASED_OUTPATIENT_CLINIC_OR_DEPARTMENT_OTHER): Payer: Medicare Other | Admitting: Lab

## 2012-04-12 DIAGNOSIS — D45 Polycythemia vera: Secondary | ICD-10-CM

## 2012-04-12 LAB — CBC WITH DIFFERENTIAL (CANCER CENTER ONLY)
BASO#: 0 10*3/uL (ref 0.0–0.2)
Eosinophils Absolute: 0.2 10*3/uL (ref 0.0–0.5)
HCT: 24.3 % — ABNORMAL LOW (ref 34.8–46.6)
HGB: 5.9 g/dL — CL (ref 11.6–15.9)
LYMPH#: 0.4 10*3/uL — ABNORMAL LOW (ref 0.9–3.3)
NEUT#: 7.1 10*3/uL — ABNORMAL HIGH (ref 1.5–6.5)
NEUT%: 84.8 % — ABNORMAL HIGH (ref 39.6–80.0)
RBC: 3.8 10*6/uL (ref 3.70–5.32)

## 2012-04-18 ENCOUNTER — Other Ambulatory Visit: Payer: Self-pay | Admitting: *Deleted

## 2012-04-19 ENCOUNTER — Ambulatory Visit: Payer: Medicare Other | Admitting: Hematology & Oncology

## 2012-04-19 ENCOUNTER — Other Ambulatory Visit: Payer: Medicare Other | Admitting: Lab

## 2012-04-20 ENCOUNTER — Ambulatory Visit: Payer: Medicare Other

## 2012-04-26 ENCOUNTER — Other Ambulatory Visit (HOSPITAL_BASED_OUTPATIENT_CLINIC_OR_DEPARTMENT_OTHER): Payer: Medicare Other | Admitting: Lab

## 2012-04-26 ENCOUNTER — Ambulatory Visit (HOSPITAL_BASED_OUTPATIENT_CLINIC_OR_DEPARTMENT_OTHER): Payer: Medicare Other | Admitting: Hematology & Oncology

## 2012-04-26 ENCOUNTER — Telehealth: Payer: Self-pay | Admitting: Hematology & Oncology

## 2012-04-26 ENCOUNTER — Encounter: Payer: Self-pay | Admitting: *Deleted

## 2012-04-26 VITALS — BP 127/56 | HR 64 | Temp 97.6°F | Resp 16 | Ht 63.0 in | Wt 172.0 lb

## 2012-04-26 DIAGNOSIS — D45 Polycythemia vera: Secondary | ICD-10-CM

## 2012-04-26 DIAGNOSIS — R42 Dizziness and giddiness: Secondary | ICD-10-CM

## 2012-04-26 DIAGNOSIS — M797 Fibromyalgia: Secondary | ICD-10-CM

## 2012-04-26 LAB — CBC WITH DIFFERENTIAL (CANCER CENTER ONLY)
BASO#: 0 10*3/uL (ref 0.0–0.2)
Eosinophils Absolute: 0.1 10*3/uL (ref 0.0–0.5)
HCT: 20.6 % — ABNORMAL LOW (ref 34.8–46.6)
HGB: 5 g/dL — CL (ref 11.6–15.9)
LYMPH%: 15 % (ref 14.0–48.0)
MCH: 15.7 pg — ABNORMAL LOW (ref 26.0–34.0)
MCHC: 24.3 g/dL — ABNORMAL LOW (ref 32.0–36.0)
MCV: 65 fL — ABNORMAL LOW (ref 81–101)
MONO%: 14.1 % — ABNORMAL HIGH (ref 0.0–13.0)
NEUT#: 3.2 10*3/uL (ref 1.5–6.5)
NEUT%: 68.5 % (ref 39.6–80.0)
RBC: 3.19 10*6/uL — ABNORMAL LOW (ref 3.70–5.32)

## 2012-04-26 MED ORDER — ROXICODONE 15 MG PO TABS: 15.0000 mg | ORAL_TABLET | Freq: Three times a day (TID) | ORAL | Status: DC | PRN

## 2012-04-26 MED ORDER — OXYCODONE HCL 10 MG PO TB12: 10.0000 mg | ORAL_TABLET | Freq: Two times a day (BID) | ORAL | Status: DC

## 2012-04-26 MED ORDER — OXYCODONE HCL 20 MG PO TB12: 20.0000 mg | ORAL_TABLET | Freq: Three times a day (TID) | ORAL | Status: DC

## 2012-04-26 NOTE — Telephone Encounter (Signed)
Opened in error

## 2012-04-26 NOTE — Progress Notes (Signed)
This office note has been dictated.

## 2012-04-27 ENCOUNTER — Encounter: Payer: Self-pay | Admitting: Hematology & Oncology

## 2012-04-27 NOTE — Progress Notes (Signed)
CC:   Sarah A. Felicity Coyer, MD [name], M.D. Clydene Fake, M.D.  DIAGNOSES: 1. Polycythemia vera, hyperviscosity variant. 2. Severe fibromyalgia. 3. Undefined neurological disorder.  CURRENT THERAPY:  Phlebotomy to maintain her hemoglobin less than 6.  INTERIM HISTORY:  [name] comes in for followup.  She is doing okay, although this neurological issue is causing her some problems.  It is not clear as to what this is.  She has seen a neurologist.  She is taking vitamin B12.  It is hard to say if this has made a difference for her.  I told her to keep taking this.  She has had occasional headache.  There has been some dizziness.  There is no double vision.  There is no cough or shortness of breath.  She has had no change in bowel or bladder habits.  There has been no diaphoresis.  There has been no leg swelling.  PHYSICAL EXAMINATION:  General:  This is a pale-appearing white female in no obvious distress.  Vital signs:  Temperature of 97.6, pulse 64, respiratory rate 16, blood pressure 127/56.  Weight is 172.  Head and neck:  Normocephalic, atraumatic skull.  There are no ocular or oral lesions.  She has some partial alopecia.  Conjunctivae are pale.  Pupils react appropriately.  Neck:  Supple, with no adenopathy.  Lungs:  Clear bilaterally.  Cardiac:  Regular rate and rhythm with a normal S1, S2. There are no murmurs, rubs or bruits.  Abdomen:  Soft with good bowel sounds.  There is no palpable abdominal mass.  There is no fluid wave. There is no palpable hepatosplenomegaly.  Extremities:  No clubbing, cyanosis or edema.  She has good range of motion of her joints.  She has tenderness over the long bones.  Back:  Shows some tenderness along the paravertebral spine.  LABORATORY STUDIES:  White cell count is 4.6, hemoglobin 5, hematocrit 20.6, platelet count 224,000.  MCV is 65.  IMPRESSION:  [name] is a 67 year old white female with a hyperviscosity variant  of polycythemia.  She really does well with her hemoglobin below 6.  I realize that this is quite unusual, but for [name] this works perfectly for her.  Of note, she is profoundly iron deficient.  I do not know if this is causing some of the neurological issues that she might be having.  She is on B12.  She was worried about taking this as she thought this was going to increase her blood count.  I reassured that it was not as she is iron deficient and, as such, cannot really use the B12 for blood production.  We will go ahead and plan to get her back every 2 weeks as we are doing. She will have her blood work done every 2 weeks.  I will see her back in 2 months.    ______________________________ Josph Macho, M.D. PRE/MEDQ  D:  04/26/2012  T:  04/26/2012  Job:  [id number]

## 2012-05-03 ENCOUNTER — Other Ambulatory Visit: Payer: Medicare Other | Admitting: Lab

## 2012-05-10 ENCOUNTER — Other Ambulatory Visit (HOSPITAL_BASED_OUTPATIENT_CLINIC_OR_DEPARTMENT_OTHER): Payer: Medicare Other | Admitting: Lab

## 2012-05-10 DIAGNOSIS — D45 Polycythemia vera: Secondary | ICD-10-CM

## 2012-05-10 LAB — CBC WITH DIFFERENTIAL (CANCER CENTER ONLY)
BASO%: 0.2 % (ref 0.0–2.0)
Eosinophils Absolute: 0.5 10*3/uL (ref 0.0–0.5)
HCT: 22.2 % — ABNORMAL LOW (ref 34.8–46.6)
LYMPH%: 21.4 % (ref 14.0–48.0)
MCH: 14.6 pg — ABNORMAL LOW (ref 26.0–34.0)
MCV: 62 fL — ABNORMAL LOW (ref 81–101)
MONO#: 0.6 10*3/uL (ref 0.1–0.9)
NEUT%: 55.5 % (ref 39.6–80.0)
RDW: 21.1 % — ABNORMAL HIGH (ref 11.1–15.7)
WBC: 4.6 10*3/uL (ref 3.9–10.0)

## 2012-05-24 ENCOUNTER — Other Ambulatory Visit (HOSPITAL_BASED_OUTPATIENT_CLINIC_OR_DEPARTMENT_OTHER): Payer: Medicare Other | Admitting: Lab

## 2012-05-24 ENCOUNTER — Other Ambulatory Visit: Payer: Self-pay | Admitting: *Deleted

## 2012-05-24 DIAGNOSIS — D45 Polycythemia vera: Secondary | ICD-10-CM

## 2012-05-24 DIAGNOSIS — M797 Fibromyalgia: Secondary | ICD-10-CM

## 2012-05-24 LAB — CBC WITH DIFFERENTIAL (CANCER CENTER ONLY)
BASO#: 0 10*3/uL (ref 0.0–0.2)
Eosinophils Absolute: 0 10*3/uL (ref 0.0–0.5)
HGB: 5.5 g/dL — CL (ref 11.6–15.9)
LYMPH%: 12.2 % — ABNORMAL LOW (ref 14.0–48.0)
MCH: 14.7 pg — ABNORMAL LOW (ref 26.0–34.0)
MCV: 63 fL — ABNORMAL LOW (ref 81–101)
MONO%: 9.1 % (ref 0.0–13.0)
RBC: 3.74 10*6/uL (ref 3.70–5.32)

## 2012-05-24 MED ORDER — ROXICODONE 15 MG PO TABS: 15.0000 mg | ORAL_TABLET | Freq: Three times a day (TID) | ORAL | Status: DC | PRN

## 2012-05-24 MED ORDER — OXYCODONE HCL 10 MG PO TB12: 10.0000 mg | ORAL_TABLET | Freq: Two times a day (BID) | ORAL | Status: DC

## 2012-05-24 MED ORDER — OXYCODONE HCL 20 MG PO TB12: 20.0000 mg | ORAL_TABLET | Freq: Three times a day (TID) | ORAL | Status: DC

## 2012-05-24 NOTE — Telephone Encounter (Signed)
Pt left message asking to have her pain medications refilled with her lab visit on 05/24/12.. Will route to Dr [name] for approval and place at the front desk when ready.

## 2012-06-07 ENCOUNTER — Other Ambulatory Visit (HOSPITAL_BASED_OUTPATIENT_CLINIC_OR_DEPARTMENT_OTHER): Payer: Medicare Other | Admitting: Lab

## 2012-06-07 DIAGNOSIS — D45 Polycythemia vera: Secondary | ICD-10-CM

## 2012-06-07 LAB — CBC WITH DIFFERENTIAL (CANCER CENTER ONLY)
BASO#: 0 10*3/uL (ref 0.0–0.2)
EOS%: 6.1 % (ref 0.0–7.0)
Eosinophils Absolute: 0.2 10*3/uL (ref 0.0–0.5)
HGB: 5.5 g/dL — CL (ref 11.6–15.9)
LYMPH%: 20 % (ref 14.0–48.0)
MCH: 14.9 pg — ABNORMAL LOW (ref 26.0–34.0)
MCHC: 23.6 g/dL — ABNORMAL LOW (ref 32.0–36.0)
MCV: 63 fL — ABNORMAL LOW (ref 81–101)
MONO%: 12.8 % (ref 0.0–13.0)
NEUT#: 2.3 10*3/uL (ref 1.5–6.5)
RBC: 3.69 10*6/uL — ABNORMAL LOW (ref 3.70–5.32)

## 2012-06-09 ENCOUNTER — Telehealth: Payer: Self-pay | Admitting: Hematology & Oncology

## 2012-06-09 NOTE — Telephone Encounter (Signed)
Called pt to see if she would see PA but she insisted on seeing MD. I changed appointment to 3-25 at 11:30

## 2012-06-20 ENCOUNTER — Other Ambulatory Visit: Payer: Medicare Other | Admitting: Lab

## 2012-06-20 ENCOUNTER — Telehealth: Payer: Self-pay | Admitting: Hematology & Oncology

## 2012-06-20 ENCOUNTER — Ambulatory Visit: Payer: Medicare Other | Admitting: Hematology & Oncology

## 2012-06-20 NOTE — Telephone Encounter (Signed)
Pt cancelled due to weather, will call back to reschedule

## 2012-06-21 ENCOUNTER — Ambulatory Visit: Payer: Medicare Other | Admitting: Hematology & Oncology

## 2012-06-21 ENCOUNTER — Other Ambulatory Visit: Payer: Medicare Other | Admitting: Lab

## 2012-06-21 ENCOUNTER — Other Ambulatory Visit: Payer: Self-pay | Admitting: *Deleted

## 2012-06-21 DIAGNOSIS — J329 Chronic sinusitis, unspecified: Secondary | ICD-10-CM

## 2012-06-22 ENCOUNTER — Telehealth: Payer: Self-pay | Admitting: Hematology & Oncology

## 2012-06-22 NOTE — Telephone Encounter (Signed)
Patient called and resch 06/21/12 missed apt for 06/27/12

## 2012-06-23 ENCOUNTER — Other Ambulatory Visit: Payer: Self-pay | Admitting: *Deleted

## 2012-06-27 ENCOUNTER — Ambulatory Visit (HOSPITAL_BASED_OUTPATIENT_CLINIC_OR_DEPARTMENT_OTHER): Payer: Medicare Other | Admitting: Hematology & Oncology

## 2012-06-27 ENCOUNTER — Other Ambulatory Visit (HOSPITAL_BASED_OUTPATIENT_CLINIC_OR_DEPARTMENT_OTHER): Payer: Medicare Other | Admitting: Lab

## 2012-06-27 ENCOUNTER — Other Ambulatory Visit: Payer: Self-pay | Admitting: *Deleted

## 2012-06-27 VITALS — BP 147/51 | HR 60 | Temp 97.9°F | Resp 16 | Ht 63.0 in | Wt 165.0 lb

## 2012-06-27 DIAGNOSIS — M797 Fibromyalgia: Secondary | ICD-10-CM

## 2012-06-27 DIAGNOSIS — D45 Polycythemia vera: Secondary | ICD-10-CM

## 2012-06-27 LAB — CBC WITH DIFFERENTIAL (CANCER CENTER ONLY)
BASO%: 0.5 % (ref 0.0–2.0)
EOS%: 8.1 % — ABNORMAL HIGH (ref 0.0–7.0)
Eosinophils Absolute: 0.3 10*3/uL (ref 0.0–0.5)
LYMPH%: 11.6 % — ABNORMAL LOW (ref 14.0–48.0)
MCH: 14.9 pg — ABNORMAL LOW (ref 26.0–34.0)
MCHC: 24.2 g/dL — ABNORMAL LOW (ref 32.0–36.0)
MCV: 62 fL — ABNORMAL LOW (ref 81–101)
MONO%: 13.4 % — ABNORMAL HIGH (ref 0.0–13.0)
NEUT#: 2.5 10*3/uL (ref 1.5–6.5)
Platelets: 246 10*3/uL (ref 145–400)
RBC: 4.23 10*6/uL (ref 3.70–5.32)
RDW: 23.5 % — ABNORMAL HIGH (ref 11.1–15.7)

## 2012-06-27 MED ORDER — ROXICODONE 15 MG PO TABS: 15.0000 mg | ORAL_TABLET | Freq: Three times a day (TID) | ORAL | Status: DC | PRN

## 2012-06-27 MED ORDER — ALPRAZOLAM 0.5 MG PO TABS: 0.5000 mg | ORAL_TABLET | Freq: Four times a day (QID) | ORAL | Status: DC | PRN

## 2012-06-27 MED ORDER — OXYCODONE HCL 20 MG PO TB12: 20.0000 mg | ORAL_TABLET | Freq: Three times a day (TID) | ORAL | Status: DC

## 2012-06-27 MED ORDER — OXYCODONE HCL 10 MG PO TB12: 10.0000 mg | ORAL_TABLET | Freq: Two times a day (BID) | ORAL | Status: DC

## 2012-06-27 NOTE — Telephone Encounter (Signed)
Pt called on 06/26/12 asking to have her rx given to her while she was here today. She wants to save herself a trip since they live such a far distance. Called [facility] [place]. She last had her rx filled on 06/06/12 therefore would be due on 07/04/12. Will route rx to Dr [name] for approval & signature then place at the front desk for signature.

## 2012-06-27 NOTE — Progress Notes (Signed)
CC:   Bernette Redbird, M.D. [name], M.D.  DIAGNOSIS:  Polycythemia vera, hyperviscosity variant.  CURRENT THERAPY:  Phlebotomy to maintain a hemoglobin less than 6.  INTERIM HISTORY:  Ms. Dragovich comes in for her followup.  She is doing okay.  She has not been phlebotomized probably for about 10 weeks.  This is quite a long time for her.  I forgot to mention that she is on vitamin B12 now.  She did see a neurologist.  Her B12 level was a little bit on the low side.  She feels that the B12 is making her feel a little bit better.  Her fibromyalgia has not "flared up".  She is on OxyContin and oxycodone for this.  This helps control this and it makes her functional.  Again, she is quite iron deficient.  Her last iron studies showed a ferritin of 2.  She has had a little bit of wheezing.  She had a little bit of urinary tract infection.  She recently was on Keflex.  She has had no headache.  There has been no double vision or blurred vision.  PHYSICAL EXAMINATION:  General:  This is a pale appearing white female in no obvious distress.  Vital signs:  Show temperature 97.6, pulse 60, respiratory rate 16, blood pressure 147/50.  Weight is 165.  Head and neck:  Shows a normocephalic, atraumatic skull.  There are no ocular or oral lesions.  There are no palpable cervical or supraclavicular lymph nodes.  Lungs:  Clear bilaterally.  Cardiac:  Regular rate and rhythm with a normal S1, S2.  There are no murmurs, rubs or bruits.  Abdomen: Soft with good bowel sounds.  There is no palpable abdominal mass. There is no fluid wave.  There is no palpable hepatosplenomegaly. Extremities:  No clubbing, cyanosis or edema.  Skin:  Somewhat pale.  LABORATORY STUDIES:  White cell count is 3.7, hemoglobin 6.3, hematocrit 26, platelet count is 246.  MCV is 62.  IMPRESSION:  Ms. Kepple is a very nice 67 year old white female with polycythemia vera.  She has a hyperviscosity variant and as such  she really does well with hemoglobin less than 6.  We will get her set up with a phlebotomy this week.  We check her blood counts every couple weeks.  We will plan to get her back to see [place] in another 2 months, which is typically standard for her.  We did refill her medications today.  She does need a colonoscopy.  I recommended Dr. Matthias Hughs.  She will call him.    ______________________________ Josph Macho, M.D. PRE/MEDQ  D:  06/27/2012  T:  06/27/2012  Job:  [id number]

## 2012-06-27 NOTE — Addendum Note (Signed)
Addended by: Cathi Roan on: 06/27/2012 02:12 PM   Modules accepted: Orders, Medications

## 2012-06-27 NOTE — Progress Notes (Signed)
This office note has been dictated.

## 2012-06-29 ENCOUNTER — Ambulatory Visit (HOSPITAL_BASED_OUTPATIENT_CLINIC_OR_DEPARTMENT_OTHER): Payer: Medicare Other

## 2012-06-29 VITALS — BP 112/72 | HR 72 | Temp 97.5°F

## 2012-06-29 DIAGNOSIS — D45 Polycythemia vera: Secondary | ICD-10-CM

## 2012-06-29 NOTE — Patient Instructions (Addendum)

## 2012-06-29 NOTE — Progress Notes (Signed)
[name] presents today for phlebotomy per MD orders. Phlebotomy procedure started at 1110 and ended at 1120 500 grams removed. Patient observed for 30 minutes after procedure without any incident. Patient tolerated procedure well. IV needle removed intact.   

## 2012-06-30 ENCOUNTER — Encounter: Payer: Self-pay | Admitting: Cardiology

## 2012-07-01 ENCOUNTER — Other Ambulatory Visit: Payer: Self-pay | Admitting: Hematology & Oncology

## 2012-07-03 ENCOUNTER — Other Ambulatory Visit: Payer: Self-pay | Admitting: *Deleted

## 2012-07-03 DIAGNOSIS — I10 Essential (primary) hypertension: Secondary | ICD-10-CM

## 2012-07-03 DIAGNOSIS — E039 Hypothyroidism, unspecified: Secondary | ICD-10-CM

## 2012-07-03 MED ORDER — BISOPROLOL FUMARATE 10 MG PO TABS: ORAL_TABLET | ORAL | Status: DC

## 2012-07-03 MED ORDER — ARMOUR THYROID 60 MG PO TABS: 60.0000 mg | ORAL_TABLET | Freq: Every day | ORAL | Status: DC

## 2012-07-03 MED ORDER — FAMOTIDINE 20 MG PO TABS: 20.0000 mg | ORAL_TABLET | Freq: Two times a day (BID) | ORAL | Status: DC

## 2012-07-05 ENCOUNTER — Other Ambulatory Visit: Payer: Self-pay | Admitting: *Deleted

## 2012-07-05 ENCOUNTER — Telehealth: Payer: Self-pay | Admitting: Cardiology

## 2012-07-05 ENCOUNTER — Other Ambulatory Visit (HOSPITAL_BASED_OUTPATIENT_CLINIC_OR_DEPARTMENT_OTHER): Payer: Medicare Other | Admitting: Lab

## 2012-07-05 DIAGNOSIS — D45 Polycythemia vera: Secondary | ICD-10-CM

## 2012-07-05 LAB — CBC WITH DIFFERENTIAL (CANCER CENTER ONLY)
BASO#: 0 10*3/uL (ref 0.0–0.2)
Eosinophils Absolute: 0.4 10*3/uL (ref 0.0–0.5)
HCT: 22.5 % — ABNORMAL LOW (ref 34.8–46.6)
LYMPH%: 15.2 % (ref 14.0–48.0)
MCH: 15.2 pg — ABNORMAL LOW (ref 26.0–34.0)
MCV: 63 fL — ABNORMAL LOW (ref 81–101)
MONO#: 0.5 10*3/uL (ref 0.1–0.9)
MONO%: 12.2 % (ref 0.0–13.0)
NEUT%: 62.2 % (ref 39.6–80.0)
Platelets: 292 10*3/uL (ref 145–400)
RBC: 3.56 10*6/uL — ABNORMAL LOW (ref 3.70–5.32)
WBC: 3.7 10*3/uL — ABNORMAL LOW (ref 3.9–10.0)

## 2012-07-05 NOTE — Telephone Encounter (Signed)
Discussed medications with Amy and what Dr [name] would write for

## 2012-07-05 NOTE — Telephone Encounter (Signed)
Reviewed with Dr [name]. To have Dr Patty Sermons rx Plavix. Spoke to [place], [place] with Dr Patty Sermons. They will be happy to take over prescribing it along with azithromycin. Dr [name] will continue to prescribe her narcotics and phenergan. Bobi made aware.

## 2012-07-05 NOTE — Telephone Encounter (Signed)
New problem   Amy/Dr [name] office want to let you know what medication the pt need to be taking. Please call Amy concerning this matter.

## 2012-07-12 ENCOUNTER — Other Ambulatory Visit: Payer: Medicare Other | Admitting: Lab

## 2012-07-19 ENCOUNTER — Other Ambulatory Visit (HOSPITAL_BASED_OUTPATIENT_CLINIC_OR_DEPARTMENT_OTHER): Payer: Medicare Other | Admitting: Lab

## 2012-07-19 DIAGNOSIS — D45 Polycythemia vera: Secondary | ICD-10-CM

## 2012-07-19 LAB — CBC WITH DIFFERENTIAL (CANCER CENTER ONLY)
BASO%: 0.3 % (ref 0.0–2.0)
EOS%: 20.7 % — ABNORMAL HIGH (ref 0.0–7.0)
HCT: 23.3 % — ABNORMAL LOW (ref 34.8–46.6)
LYMPH%: 21 % (ref 14.0–48.0)
MCH: 15.3 pg — ABNORMAL LOW (ref 26.0–34.0)
MCHC: 23.2 g/dL — ABNORMAL LOW (ref 32.0–36.0)
MCV: 66 fL — ABNORMAL LOW (ref 81–101)
MONO#: 0.4 10*3/uL (ref 0.1–0.9)
MONO%: 10.3 % (ref 0.0–13.0)
NEUT%: 47.7 % (ref 39.6–80.0)
Platelets: 129 10*3/uL — ABNORMAL LOW (ref 145–400)
RDW: 22.1 % — ABNORMAL HIGH (ref 11.1–15.7)
WBC: 3.8 10*3/uL — ABNORMAL LOW (ref 3.9–10.0)

## 2012-07-26 ENCOUNTER — Other Ambulatory Visit: Payer: Medicare Other | Admitting: Lab

## 2012-08-01 ENCOUNTER — Other Ambulatory Visit: Payer: Self-pay | Admitting: *Deleted

## 2012-08-01 DIAGNOSIS — M797 Fibromyalgia: Secondary | ICD-10-CM

## 2012-08-01 MED ORDER — OXYCODONE HCL 20 MG PO TB12: 20.0000 mg | ORAL_TABLET | Freq: Three times a day (TID) | ORAL | Status: DC

## 2012-08-01 MED ORDER — ROXICODONE 15 MG PO TABS: 15.0000 mg | ORAL_TABLET | Freq: Three times a day (TID) | ORAL | Status: DC | PRN

## 2012-08-01 MED ORDER — OXYCODONE HCL 10 MG PO TB12: 10.0000 mg | ORAL_TABLET | Freq: Two times a day (BID) | ORAL | Status: DC

## 2012-08-01 NOTE — Telephone Encounter (Signed)
Pt called yesterday 07/31/12 asking to have her pain medications refilled with her lab visit today. Will route to Dr [name] for approval and place at the front desk when ready.

## 2012-08-02 ENCOUNTER — Other Ambulatory Visit (HOSPITAL_BASED_OUTPATIENT_CLINIC_OR_DEPARTMENT_OTHER): Payer: Medicare Other | Admitting: Lab

## 2012-08-02 ENCOUNTER — Telehealth: Payer: Self-pay | Admitting: Hematology & Oncology

## 2012-08-02 DIAGNOSIS — D45 Polycythemia vera: Secondary | ICD-10-CM

## 2012-08-02 LAB — CBC WITH DIFFERENTIAL (CANCER CENTER ONLY)
BASO#: 0 10*3/uL (ref 0.0–0.2)
BASO%: 0.5 % (ref 0.0–2.0)
EOS%: 6.6 % (ref 0.0–7.0)
LYMPH#: 0.9 10*3/uL (ref 0.9–3.3)
MCH: 15.4 pg — ABNORMAL LOW (ref 26.0–34.0)
MCHC: 24 g/dL — ABNORMAL LOW (ref 32.0–36.0)
MONO%: 12.9 % (ref 0.0–13.0)
NEUT#: 2.4 10*3/uL (ref 1.5–6.5)
Platelets: 278 10*3/uL (ref 145–400)
RDW: 22.4 % — ABNORMAL HIGH (ref 11.1–15.7)

## 2012-08-02 NOTE — Telephone Encounter (Signed)
Pt was here today and wanted to update her files to release information to  her husband Onalee Hua), in regards to billing, records and other related information w [facility].  I have updated all related HIPPA and ROI and they are located in documents and media manger.   Pt has signed all hard copies, as well as, e-sign at this time.

## 2012-08-03 ENCOUNTER — Ambulatory Visit (HOSPITAL_BASED_OUTPATIENT_CLINIC_OR_DEPARTMENT_OTHER): Payer: Medicare Other

## 2012-08-03 VITALS — BP 116/74 | HR 74 | Temp 97.9°F

## 2012-08-03 DIAGNOSIS — D45 Polycythemia vera: Secondary | ICD-10-CM

## 2012-08-03 NOTE — Progress Notes (Signed)
[name] presents today for phlebotomy per MD orders. Phlebotomy procedure started at 1310 and ended at 1315. 500 grams removed. Patient observed for 30 minutes after procedure without any incident. Patient tolerated procedure well. IV needle removed intact.

## 2012-08-03 NOTE — Patient Instructions (Addendum)
Therapeutic Phlebotomy Therapeutic phlebotomy is the controlled removal of blood from your body for the purpose of treating a medical condition. It is similar to donating blood. Usually, about a pint (470 mL) of blood is removed. The average adult has 9 to 12 pints (4.3 to 5.7 L) of blood. Therapeutic phlebotomy may be used to treat the following medical conditions:  Hemochromatosis. This is a condition in which there is too much iron in the blood.  Polycythemia vera. This is a condition in which there are too many red cells in the blood.  Porphyria cutanea tarda. This is a disease usually passed from one generation to the next (inherited). It is a condition in which an important part of hemoglobin is not made properly. This results in the build up of abnormal amounts of porphyrins in the body.  Sickle cell disease. This is an inherited disease. It is a condition in which the red blood cells form an abnormal crescent shape rather than a round shape. LET YOUR CAREGIVER KNOW ABOUT:  Allergies.  Medicines taken including herbs, eyedrops, over-the-counter medicines, and creams.  Use of steroids (by mouth or creams).  Previous problems with anesthetics or numbing medicine.  History of blood clots.  History of bleeding or blood problems.  Previous surgery.  Possibility of pregnancy, if this applies. RISKS AND COMPLICATIONS This is a simple and safe procedure. Problems are unlikely. However, problems can occur and may include:  Nausea or lightheadedness.  Low blood pressure.  Soreness, bleeding, swelling, or bruising at the needle insertion site.  Infection. BEFORE THE PROCEDURE  This is a procedure that can be done as an outpatient. Confirm the time that you need to arrive for your procedure. Confirm whether there is a need to fast or withhold any medications. It is helpful to wear clothing with sleeves that can be raised above the elbow. A blood sample may be done to determine the  amount of red blood cells or iron in your blood. Plan ahead of time to have someone drive you home after the procedure. PROCEDURE The entire procedure from preparation through recovery takes about 1 hour. The actual collection takes about 10 to 15 minutes.  A needle will be inserted into your vein.  Tubing and a collection bag will be attached to that needle.  Blood will flow through the needle and tubing into the collection bag.  You may be asked to open and close your hand slowly and continuously during the entire collection.  Once the specified amount of blood has been removed from your body, the collection bag and tubing will be clamped.  The needle will be removed.  Pressure will be held on the site of the needle insertion to stop the bleeding. Then a bandage will be placed over the needle insertion site. AFTER THE PROCEDURE  Your recovery will be assessed and monitored. If there are no problems, as an outpatient, you should be able to go home shortly after the procedure.  Document Released: 08/17/2010 Document Revised: 06/07/2011 Document Reviewed: 08/17/2010 [employer] Patient Information 2013 [employer].  

## 2012-08-04 ENCOUNTER — Other Ambulatory Visit: Payer: Self-pay | Admitting: Cardiology

## 2012-08-04 DIAGNOSIS — G4700 Insomnia, unspecified: Secondary | ICD-10-CM

## 2012-08-04 MED ORDER — TEMAZEPAM 30 MG PO CAPS: ORAL_CAPSULE | ORAL | Status: DC

## 2012-08-04 NOTE — Telephone Encounter (Signed)
Refill Request    TEMAZEPAM 30 mg to [facility] in [place]. [phone]

## 2012-08-04 NOTE — Telephone Encounter (Signed)
Called to pharmacy 

## 2012-08-09 ENCOUNTER — Other Ambulatory Visit: Payer: Medicare Other | Admitting: Lab

## 2012-08-16 ENCOUNTER — Ambulatory Visit: Payer: Medicare Other | Admitting: Hematology & Oncology

## 2012-08-16 ENCOUNTER — Other Ambulatory Visit (HOSPITAL_BASED_OUTPATIENT_CLINIC_OR_DEPARTMENT_OTHER): Payer: Medicare Other | Admitting: Lab

## 2012-08-16 ENCOUNTER — Ambulatory Visit (HOSPITAL_BASED_OUTPATIENT_CLINIC_OR_DEPARTMENT_OTHER): Payer: Medicare Other | Admitting: Hematology & Oncology

## 2012-08-16 ENCOUNTER — Other Ambulatory Visit: Payer: Medicare Other | Admitting: Lab

## 2012-08-16 VITALS — BP 126/50 | HR 63 | Temp 98.1°F | Resp 16 | Ht 63.0 in | Wt 165.0 lb

## 2012-08-16 DIAGNOSIS — D45 Polycythemia vera: Secondary | ICD-10-CM

## 2012-08-16 LAB — CBC WITH DIFFERENTIAL (CANCER CENTER ONLY)
BASO#: 0 10*3/uL (ref 0.0–0.2)
BASO%: 0.3 % (ref 0.0–2.0)
EOS%: 5.9 % (ref 0.0–7.0)
HGB: 5.8 g/dL — CL (ref 11.6–15.9)
LYMPH%: 16.3 % (ref 14.0–48.0)
MCH: 15.1 pg — ABNORMAL LOW (ref 26.0–34.0)
MONO#: 0.6 10*3/uL (ref 0.1–0.9)
NEUT#: 2.5 10*3/uL (ref 1.5–6.5)
NEUT%: 62.2 % (ref 39.6–80.0)
Platelets: 180 10*3/uL (ref 145–400)
RBC: 3.83 10*6/uL (ref 3.70–5.32)
WBC: 3.9 10*3/uL (ref 3.9–10.0)

## 2012-08-16 NOTE — Progress Notes (Signed)
This office note has been dictated.

## 2012-08-17 ENCOUNTER — Other Ambulatory Visit: Payer: Self-pay | Admitting: *Deleted

## 2012-08-17 NOTE — Progress Notes (Signed)
CC:   [name], M.D.  DIAGNOSIS:  Polycythemia vera, hyperviscosity variant.  CURRENT THERAPY:  Phlebotomy to maintain her hemoglobin between 5.5-6.  INTERIM HISTORY:  [name] comes in for her followup.  She is feeling fairly well.  She was, I think, last phlebotomized about a month ago.  She does take vitamin B12.  She feels that this is making her feel a little bit better.  She is having some spine issues.  She does not want to have any kind of surgery, however.  She has had no problems with bleeding or bruising.  There has been no cough or shortness or breath.  She has had no nausea or vomiting.  There has been no leg swelling.  She does have fibromyalgia.  This seems to be under fairly good control.  PHYSICAL EXAMINATION:  General:  This is a well-developed, well- nourished white female in no obvious distress.  Vital signs: Temperature of 98.1, pulse 63, respiratory rate 16, blood pressure 126/50.  Weight is 165.  Head and neck:  Normocephalic, atraumatic skull.  There are no ocular or oral lesions.  She has no scleral icterus.  Her conjunctivae are pale.  Lungs:  Clear bilaterally. Cardiac:  Regular rate and rhythm, with a normal S1 and S2.  She may have an extra beat.  She has a 1/6 systolic ejection murmur.  Abdomen: Soft with good bowel sounds.  There is no palpable abdominal mass. There is no fluid wave.  There is no palpable hepatosplenomegaly.  Back: No tenderness over the spine, ribs, or hips.  Extremities:  Show no clubbing, cyanosis or edema.  Neurological:  Shows no focal neurological deficits.  LABORATORY STUDIES:  White cell count is 3.9, hemoglobin 5.8, hematocrit 24.9, platelet count 180,000.  MCV is 65.  IMPRESSION:  Ms. Kurka is a very nice 67 year old white female with polycythemia vera.  She has a hyperviscosity variant.  She really does well with her hemoglobin below 6.  We will follow her every 2 weeks as we have been doing.  We will  check her blood work every 2 weeks.  I will plan to see her back in 6-8 weeks.   ______________________________ Josph Macho, M.D. PRE/MEDQ  D:  08/16/2012  T:  08/17/2012  Job:  [id number]

## 2012-08-23 ENCOUNTER — Other Ambulatory Visit: Payer: Medicare Other | Admitting: Lab

## 2012-08-23 ENCOUNTER — Ambulatory Visit: Payer: Medicare Other | Admitting: Hematology & Oncology

## 2012-08-30 ENCOUNTER — Other Ambulatory Visit (HOSPITAL_BASED_OUTPATIENT_CLINIC_OR_DEPARTMENT_OTHER): Payer: Medicare Other | Admitting: Lab

## 2012-08-30 ENCOUNTER — Other Ambulatory Visit: Payer: Self-pay | Admitting: *Deleted

## 2012-08-30 DIAGNOSIS — M797 Fibromyalgia: Secondary | ICD-10-CM

## 2012-08-30 DIAGNOSIS — D45 Polycythemia vera: Secondary | ICD-10-CM

## 2012-08-30 LAB — CBC WITH DIFFERENTIAL (CANCER CENTER ONLY)
BASO%: 0.2 % (ref 0.0–2.0)
EOS%: 3.7 % (ref 0.0–7.0)
HCT: 24.3 % — ABNORMAL LOW (ref 34.8–46.6)
LYMPH#: 1 10*3/uL (ref 0.9–3.3)
MCH: 15.6 pg — ABNORMAL LOW (ref 26.0–34.0)
MCHC: 23.9 g/dL — ABNORMAL LOW (ref 32.0–36.0)
MONO%: 14.5 % — ABNORMAL HIGH (ref 0.0–13.0)
NEUT%: 63.2 % (ref 39.6–80.0)
RDW: 22.1 % — ABNORMAL HIGH (ref 11.1–15.7)

## 2012-08-30 MED ORDER — OXYCODONE HCL 10 MG PO TB12: 10.0000 mg | ORAL_TABLET | Freq: Two times a day (BID) | ORAL | Status: DC

## 2012-08-30 MED ORDER — OXYCODONE HCL 20 MG PO TB12: 20.0000 mg | ORAL_TABLET | Freq: Three times a day (TID) | ORAL | Status: DC

## 2012-08-30 MED ORDER — ROXICODONE 15 MG PO TABS: 15.0000 mg | ORAL_TABLET | Freq: Three times a day (TID) | ORAL | Status: DC | PRN

## 2012-08-30 NOTE — Telephone Encounter (Signed)
Pt called 08/28/12 asking to have her pain medications refilled with her lab visit today. Will route to Dr [name] for approval and place at the front desk when ready.

## 2012-08-31 ENCOUNTER — Ambulatory Visit (HOSPITAL_BASED_OUTPATIENT_CLINIC_OR_DEPARTMENT_OTHER): Payer: Medicare Other

## 2012-08-31 DIAGNOSIS — D45 Polycythemia vera: Secondary | ICD-10-CM

## 2012-08-31 NOTE — Patient Instructions (Signed)
Therapeutic Phlebotomy Therapeutic phlebotomy is the controlled removal of blood from your body for the purpose of treating a medical condition. It is similar to donating blood. Usually, about a pint (470 mL) of blood is removed. The average adult has 9 to 12 pints (4.3 to 5.7 L) of blood. Therapeutic phlebotomy may be used to treat the following medical conditions:  Hemochromatosis. This is a condition in which there is too much iron in the blood.  Polycythemia vera. This is a condition in which there are too many red cells in the blood.  Porphyria cutanea tarda. This is a disease usually passed from one generation to the next (inherited). It is a condition in which an important part of hemoglobin is not made properly. This results in the build up of abnormal amounts of porphyrins in the body.  Sickle cell disease. This is an inherited disease. It is a condition in which the red blood cells form an abnormal crescent shape rather than a round shape. LET YOUR CAREGIVER KNOW ABOUT:  Allergies.  Medicines taken including herbs, eyedrops, over-the-counter medicines, and creams.  Use of steroids (by mouth or creams).  Previous problems with anesthetics or numbing medicine.  History of blood clots.  History of bleeding or blood problems.  Previous surgery.  Possibility of pregnancy, if this applies. RISKS AND COMPLICATIONS This is a simple and safe procedure. Problems are unlikely. However, problems can occur and may include:  Nausea or lightheadedness.  Low blood pressure.  Soreness, bleeding, swelling, or bruising at the needle insertion site.  Infection. BEFORE THE PROCEDURE  This is a procedure that can be done as an outpatient. Confirm the time that you need to arrive for your procedure. Confirm whether there is a need to fast or withhold any medications. It is helpful to wear clothing with sleeves that can be raised above the elbow. A blood sample may be done to determine the  amount of red blood cells or iron in your blood. Plan ahead of time to have someone drive you home after the procedure. PROCEDURE The entire procedure from preparation through recovery takes about 1 hour. The actual collection takes about 10 to 15 minutes.  A needle will be inserted into your vein.  Tubing and a collection bag will be attached to that needle.  Blood will flow through the needle and tubing into the collection bag.  You may be asked to open and close your hand slowly and continuously during the entire collection.  Once the specified amount of blood has been removed from your body, the collection bag and tubing will be clamped.  The needle will be removed.  Pressure will be held on the site of the needle insertion to stop the bleeding. Then a bandage will be placed over the needle insertion site. AFTER THE PROCEDURE  Your recovery will be assessed and monitored. If there are no problems, as an outpatient, you should be able to go home shortly after the procedure.  Document Released: 08/17/2010 Document Revised: 06/07/2011 Document Reviewed: 08/17/2010 [employer] Patient Information 2014 [employer].  

## 2012-09-07 ENCOUNTER — Other Ambulatory Visit: Payer: Self-pay | Admitting: *Deleted

## 2012-09-07 MED ORDER — FUROSEMIDE 40 MG PO TABS: 40.0000 mg | ORAL_TABLET | Freq: Two times a day (BID) | ORAL | Status: DC

## 2012-09-07 NOTE — Telephone Encounter (Signed)
Patient has been taking Lasix twice a day, ok to continue per  Dr. Patty Sermons

## 2012-09-13 ENCOUNTER — Other Ambulatory Visit (HOSPITAL_BASED_OUTPATIENT_CLINIC_OR_DEPARTMENT_OTHER): Payer: Medicare Other | Admitting: Lab

## 2012-09-13 DIAGNOSIS — D45 Polycythemia vera: Secondary | ICD-10-CM

## 2012-09-13 LAB — CBC WITH DIFFERENTIAL (CANCER CENTER ONLY)
BASO#: 0 10*3/uL (ref 0.0–0.2)
Eosinophils Absolute: 0.1 10*3/uL (ref 0.0–0.5)
HGB: 5.3 g/dL — CL (ref 11.6–15.9)
LYMPH%: 22.6 % (ref 14.0–48.0)
MCH: 15.2 pg — ABNORMAL LOW (ref 26.0–34.0)
MCV: 66 fL — ABNORMAL LOW (ref 81–101)
MONO#: 0.6 10*3/uL (ref 0.1–0.9)
MONO%: 16.3 % — ABNORMAL HIGH (ref 0.0–13.0)
NEUT#: 2.1 10*3/uL (ref 1.5–6.5)
RBC: 3.49 10*6/uL — ABNORMAL LOW (ref 3.70–5.32)
WBC: 3.5 10*3/uL — ABNORMAL LOW (ref 3.9–10.0)

## 2012-09-26 DIAGNOSIS — I639 Cerebral infarction, unspecified: Secondary | ICD-10-CM

## 2012-09-26 HISTORY — DX: Cerebral infarction, unspecified: I63.9

## 2012-09-27 ENCOUNTER — Other Ambulatory Visit (HOSPITAL_BASED_OUTPATIENT_CLINIC_OR_DEPARTMENT_OTHER): Payer: Medicare Other | Admitting: Lab

## 2012-09-27 ENCOUNTER — Other Ambulatory Visit: Payer: Self-pay | Admitting: *Deleted

## 2012-09-27 DIAGNOSIS — D45 Polycythemia vera: Secondary | ICD-10-CM

## 2012-09-27 DIAGNOSIS — M797 Fibromyalgia: Secondary | ICD-10-CM

## 2012-09-27 LAB — CBC WITH DIFFERENTIAL (CANCER CENTER ONLY)
Eosinophils Absolute: 0.1 10*3/uL (ref 0.0–0.5)
HCT: 24.2 % — ABNORMAL LOW (ref 34.8–46.6)
LYMPH%: 21.8 % (ref 14.0–48.0)
MCH: 15.2 pg — ABNORMAL LOW (ref 26.0–34.0)
MCV: 64 fL — ABNORMAL LOW (ref 81–101)
MONO#: 0.7 10*3/uL (ref 0.1–0.9)
MONO%: 16.5 % — ABNORMAL HIGH (ref 0.0–13.0)
NEUT%: 58.2 % (ref 39.6–80.0)
Platelets: 225 10*3/uL (ref 145–400)
RBC: 3.76 10*6/uL (ref 3.70–5.32)
WBC: 4 10*3/uL (ref 3.9–10.0)

## 2012-09-27 MED ORDER — OXYCODONE HCL 10 MG PO TB12: 10.0000 mg | ORAL_TABLET | Freq: Two times a day (BID) | ORAL | Status: DC

## 2012-09-27 MED ORDER — ROXICODONE 15 MG PO TABS: 15.0000 mg | ORAL_TABLET | Freq: Three times a day (TID) | ORAL | Status: DC | PRN

## 2012-09-27 MED ORDER — OXYCODONE HCL 20 MG PO TB12: 20.0000 mg | ORAL_TABLET | Freq: Three times a day (TID) | ORAL | Status: DC

## 2012-09-27 NOTE — Telephone Encounter (Signed)
Pt called yesterday afternoon asking to have her pain medications refilled with her lab visit today. Will route to Dr [name] for approval and place at the front desk when ready.

## 2012-09-28 ENCOUNTER — Telehealth: Payer: Self-pay | Admitting: Hematology & Oncology

## 2012-09-28 NOTE — Telephone Encounter (Signed)
Patient and spoke with Rn.  Per Alvino Chapel to sch patient for phlebotomy on 10/09/12 at 12noon

## 2012-09-30 ENCOUNTER — Emergency Department (HOSPITAL_COMMUNITY): Payer: Medicare Other

## 2012-09-30 ENCOUNTER — Encounter (HOSPITAL_COMMUNITY): Payer: Self-pay | Admitting: *Deleted

## 2012-09-30 ENCOUNTER — Inpatient Hospital Stay (HOSPITAL_COMMUNITY)
Admission: EM | Admit: 2012-09-30 | Discharge: 2012-10-12 | DRG: 981 | Disposition: A | Discharge status: Home / Self Care | Payer: Medicare Other | Attending: Internal Medicine | Admitting: Internal Medicine

## 2012-09-30 ENCOUNTER — Other Ambulatory Visit: Payer: Self-pay

## 2012-09-30 DIAGNOSIS — Z780 Asymptomatic menopausal state: Secondary | ICD-10-CM

## 2012-09-30 DIAGNOSIS — I119 Hypertensive heart disease without heart failure: Secondary | ICD-10-CM

## 2012-09-30 DIAGNOSIS — I498 Other specified cardiac arrhythmias: Secondary | ICD-10-CM | POA: Diagnosis not present

## 2012-09-30 DIAGNOSIS — I6529 Occlusion and stenosis of unspecified carotid artery: Secondary | ICD-10-CM | POA: Diagnosis present

## 2012-09-30 DIAGNOSIS — I2510 Atherosclerotic heart disease of native coronary artery without angina pectoris: Secondary | ICD-10-CM

## 2012-09-30 DIAGNOSIS — Z954 Presence of other heart-valve replacement: Secondary | ICD-10-CM

## 2012-09-30 DIAGNOSIS — A419 Sepsis, unspecified organism: Secondary | ICD-10-CM

## 2012-09-30 DIAGNOSIS — F411 Generalized anxiety disorder: Secondary | ICD-10-CM | POA: Diagnosis present

## 2012-09-30 DIAGNOSIS — G8190 Hemiplegia, unspecified affecting unspecified side: Secondary | ICD-10-CM | POA: Diagnosis not present

## 2012-09-30 DIAGNOSIS — D45 Polycythemia vera: Secondary | ICD-10-CM

## 2012-09-30 DIAGNOSIS — R531 Weakness: Secondary | ICD-10-CM

## 2012-09-30 DIAGNOSIS — K810 Acute cholecystitis: Secondary | ICD-10-CM

## 2012-09-30 DIAGNOSIS — K8020 Calculus of gallbladder without cholecystitis without obstruction: Secondary | ICD-10-CM

## 2012-09-30 DIAGNOSIS — R4701 Aphasia: Secondary | ICD-10-CM | POA: Diagnosis not present

## 2012-09-30 DIAGNOSIS — K8001 Calculus of gallbladder with acute cholecystitis with obstruction: Principal | ICD-10-CM | POA: Diagnosis present

## 2012-09-30 DIAGNOSIS — Z79899 Other long term (current) drug therapy: Secondary | ICD-10-CM

## 2012-09-30 DIAGNOSIS — IMO0001 Reserved for inherently not codable concepts without codable children: Secondary | ICD-10-CM

## 2012-09-30 DIAGNOSIS — I6350 Cerebral infarction due to unspecified occlusion or stenosis of unspecified cerebral artery: Secondary | ICD-10-CM | POA: Diagnosis not present

## 2012-09-30 DIAGNOSIS — E038 Other specified hypothyroidism: Secondary | ICD-10-CM | POA: Diagnosis present

## 2012-09-30 DIAGNOSIS — K219 Gastro-esophageal reflux disease without esophagitis: Secondary | ICD-10-CM | POA: Diagnosis present

## 2012-09-30 DIAGNOSIS — I358 Other nonrheumatic aortic valve disorders: Secondary | ICD-10-CM

## 2012-09-30 DIAGNOSIS — N390 Urinary tract infection, site not specified: Secondary | ICD-10-CM | POA: Diagnosis not present

## 2012-09-30 DIAGNOSIS — R109 Unspecified abdominal pain: Secondary | ICD-10-CM

## 2012-09-30 DIAGNOSIS — R6521 Severe sepsis with septic shock: Secondary | ICD-10-CM

## 2012-09-30 DIAGNOSIS — I359 Nonrheumatic aortic valve disorder, unspecified: Secondary | ICD-10-CM | POA: Diagnosis present

## 2012-09-30 DIAGNOSIS — Z79890 Hormone replacement therapy (postmenopausal): Secondary | ICD-10-CM

## 2012-09-30 DIAGNOSIS — K5900 Constipation, unspecified: Secondary | ICD-10-CM

## 2012-09-30 DIAGNOSIS — R112 Nausea with vomiting, unspecified: Secondary | ICD-10-CM | POA: Diagnosis present

## 2012-09-30 DIAGNOSIS — I639 Cerebral infarction, unspecified: Secondary | ICD-10-CM

## 2012-09-30 DIAGNOSIS — Z8673 Personal history of transient ischemic attack (TIA), and cerebral infarction without residual deficits: Secondary | ICD-10-CM

## 2012-09-30 DIAGNOSIS — G894 Chronic pain syndrome: Secondary | ICD-10-CM | POA: Diagnosis present

## 2012-09-30 DIAGNOSIS — M797 Fibromyalgia: Secondary | ICD-10-CM

## 2012-09-30 DIAGNOSIS — D649 Anemia, unspecified: Secondary | ICD-10-CM | POA: Diagnosis present

## 2012-09-30 DIAGNOSIS — J40 Bronchitis, not specified as acute or chronic: Secondary | ICD-10-CM

## 2012-09-30 DIAGNOSIS — E039 Hypothyroidism, unspecified: Secondary | ICD-10-CM

## 2012-09-30 DIAGNOSIS — E785 Hyperlipidemia, unspecified: Secondary | ICD-10-CM | POA: Diagnosis present

## 2012-09-30 DIAGNOSIS — Z7982 Long term (current) use of aspirin: Secondary | ICD-10-CM

## 2012-09-30 DIAGNOSIS — R6520 Severe sepsis without septic shock: Secondary | ICD-10-CM | POA: Diagnosis not present

## 2012-09-30 LAB — CBC WITH DIFFERENTIAL/PLATELET
Basophils Absolute: 0 10*3/uL (ref 0.0–0.1)
Basophils Relative: 0 % (ref 0–1)
Eosinophils Absolute: 0 10*3/uL (ref 0.0–0.7)
Eosinophils Relative: 0 % (ref 0–5)
HCT: 23.1 % — ABNORMAL LOW (ref 36.0–46.0)
Hemoglobin: 5.7 g/dL — CL (ref 12.0–15.0)
MCH: 15.2 pg — ABNORMAL LOW (ref 26.0–34.0)
MCHC: 24.7 g/dL — ABNORMAL LOW (ref 30.0–36.0)
Monocytes Absolute: 0.3 10*3/uL (ref 0.1–1.0)
Monocytes Relative: 5 % (ref 3–12)
RDW: 21.6 % — ABNORMAL HIGH (ref 11.5–15.5)

## 2012-09-30 LAB — COMPREHENSIVE METABOLIC PANEL
ALT: 6 U/L (ref 0–35)
AST: 15 U/L (ref 0–37)
Alkaline Phosphatase: 52 U/L (ref 39–117)
CO2: 31 mEq/L (ref 19–32)
Calcium: 8.9 mg/dL (ref 8.4–10.5)
GFR calc Af Amer: 83 mL/min — ABNORMAL LOW (ref >90)
Glucose, Bld: 123 mg/dL — ABNORMAL HIGH (ref 70–99)
Potassium: 3.3 mEq/L — ABNORMAL LOW (ref 3.5–5.1)
Sodium: 135 mEq/L (ref 135–145)
Total Protein: 6.6 g/dL (ref 6.0–8.3)

## 2012-09-30 LAB — POCT I-STAT TROPONIN I

## 2012-09-30 LAB — CBC
MCH: 15 pg — ABNORMAL LOW (ref 26.0–34.0)
MCV: 61.3 fL — ABNORMAL LOW (ref 78.0–100.0)
Platelets: 205 10*3/uL (ref 150–400)
RBC: 4.06 MIL/uL (ref 3.87–5.11)
RDW: 21.5 % — ABNORMAL HIGH (ref 11.5–15.5)
WBC: 10 10*3/uL (ref 4.0–10.5)

## 2012-09-30 LAB — POCT I-STAT, CHEM 8
BUN: 9 mg/dL (ref 6–23)
Chloride: 94 mEq/L — ABNORMAL LOW (ref 96–112)
Creatinine, Ser: 1 mg/dL (ref 0.50–1.10)
Sodium: 137 mEq/L (ref 135–145)
TCO2: 32 mmol/L (ref 0–100)

## 2012-09-30 LAB — CREATININE, SERUM
Creatinine, Ser: 0.76 mg/dL (ref 0.50–1.10)
GFR calc non Af Amer: 84 mL/min — ABNORMAL LOW (ref >90)

## 2012-09-30 MED ORDER — ASPIRIN 325 MG PO TABS
325.0000 mg | ORAL_TABLET | ORAL | Status: AC
  Administered 2012-09-30: 325 mg via ORAL
  Filled 2012-09-30: qty 1

## 2012-09-30 MED ORDER — HYDROMORPHONE HCL PF 1 MG/ML IJ SOLN
1.0000 mg | Freq: Once | INTRAMUSCULAR | Status: AC
  Administered 2012-09-30: 1 mg via INTRAVENOUS
  Filled 2012-09-30: qty 1

## 2012-09-30 MED ORDER — HYDROMORPHONE HCL PF 1 MG/ML IJ SOLN
0.5000 mg | Freq: Once | INTRAMUSCULAR | Status: AC
  Administered 2012-09-30: 0.5 mg via INTRAVENOUS
  Filled 2012-09-30: qty 1

## 2012-09-30 MED ORDER — ONDANSETRON HCL 4 MG/2ML IJ SOLN
4.0000 mg | Freq: Once | INTRAMUSCULAR | Status: AC
  Administered 2012-09-30: 4 mg via INTRAVENOUS

## 2012-09-30 MED ORDER — ESTROGENS CONJUGATED 0.625 MG PO TABS
0.6250 mg | ORAL_TABLET | Freq: Two times a day (BID) | ORAL | Status: DC
  Administered 2012-09-30 – 2012-10-12 (×23): 0.625 mg via ORAL
  Filled 2012-09-30 (×29): qty 1

## 2012-09-30 MED ORDER — OXYCODONE HCL ER 10 MG PO T12A
20.0000 mg | EXTENDED_RELEASE_TABLET | Freq: Three times a day (TID) | ORAL | Status: DC
  Administered 2012-09-30 – 2012-10-01 (×4): 20 mg via ORAL
  Filled 2012-09-30 (×2): qty 2
  Filled 2012-09-30: qty 1
  Filled 2012-09-30: qty 2

## 2012-09-30 MED ORDER — ONDANSETRON HCL 4 MG/2ML IJ SOLN
4.0000 mg | Freq: Once | INTRAMUSCULAR | Status: AC
  Administered 2012-09-30: 4 mg via INTRAVENOUS
  Filled 2012-09-30: qty 2

## 2012-09-30 MED ORDER — HYDROMORPHONE HCL PF 1 MG/ML IJ SOLN
0.5000 mg | Freq: Once | INTRAMUSCULAR | Status: AC
  Administered 2012-09-30: 0.5 mg via INTRAVENOUS

## 2012-09-30 MED ORDER — SODIUM CHLORIDE 0.9 % IV BOLUS (SEPSIS)
500.0000 mL | Freq: Once | INTRAVENOUS | Status: AC
  Administered 2012-09-30: 500 mL via INTRAVENOUS

## 2012-09-30 MED ORDER — FENTANYL CITRATE 0.05 MG/ML IJ SOLN
50.0000 ug | Freq: Once | INTRAMUSCULAR | Status: AC
  Administered 2012-09-30: 50 ug via INTRAVENOUS
  Filled 2012-09-30: qty 2

## 2012-09-30 MED ORDER — THYROID 30 MG PO TABS
150.0000 mg | ORAL_TABLET | Freq: Every day | ORAL | Status: DC
  Filled 2012-09-30: qty 1

## 2012-09-30 MED ORDER — ALPRAZOLAM 0.5 MG PO TABS
0.5000 mg | ORAL_TABLET | Freq: Four times a day (QID) | ORAL | Status: DC | PRN
  Administered 2012-09-30 – 2012-10-11 (×11): 0.5 mg via ORAL
  Filled 2012-09-30 (×11): qty 1

## 2012-09-30 MED ORDER — DIPHENHYDRAMINE HCL 50 MG/ML IJ SOLN
12.5000 mg | Freq: Four times a day (QID) | INTRAMUSCULAR | Status: DC | PRN
  Administered 2012-10-10: 12.5 mg via INTRAVENOUS
  Filled 2012-09-30: qty 1

## 2012-09-30 MED ORDER — TEMAZEPAM 15 MG PO CAPS
30.0000 mg | ORAL_CAPSULE | Freq: Every evening | ORAL | Status: DC | PRN
  Administered 2012-09-30: 30 mg via ORAL
  Filled 2012-09-30 (×2): qty 1

## 2012-09-30 MED ORDER — ONDANSETRON HCL 4 MG/2ML IJ SOLN
4.0000 mg | Freq: Four times a day (QID) | INTRAMUSCULAR | Status: DC | PRN
  Administered 2012-09-30: 4 mg via INTRAVENOUS
  Filled 2012-09-30: qty 2

## 2012-09-30 MED ORDER — ENOXAPARIN SODIUM 40 MG/0.4ML ~~LOC~~ SOLN
40.0000 mg | SUBCUTANEOUS | Status: DC
  Administered 2012-09-30 – 2012-10-01 (×2): 40 mg via SUBCUTANEOUS
  Filled 2012-09-30 (×3): qty 0.4

## 2012-09-30 MED ORDER — ONDANSETRON HCL 4 MG/2ML IJ SOLN
INTRAMUSCULAR | Status: AC
  Administered 2012-09-30: 4 mg
  Filled 2012-09-30: qty 2

## 2012-09-30 MED ORDER — OXYCODONE HCL 10 MG PO TB12
10.0000 mg | ORAL_TABLET | Freq: Two times a day (BID) | ORAL | Status: DC
  Administered 2012-09-30: 10 mg via ORAL
  Filled 2012-09-30 (×3): qty 1

## 2012-09-30 MED ORDER — KCL IN DEXTROSE-NACL 20-5-0.45 MEQ/L-%-% IV SOLN
INTRAVENOUS | Status: DC
  Administered 2012-09-30 – 2012-10-01 (×2): via INTRAVENOUS
  Administered 2012-10-01: 100 mL/h via INTRAVENOUS
  Filled 2012-09-30 (×7): qty 1000

## 2012-09-30 MED ORDER — DIPHENHYDRAMINE HCL 12.5 MG/5ML PO ELIX: 12.5000 mg | ORAL_SOLUTION | Freq: Four times a day (QID) | ORAL | Status: DC | PRN

## 2012-09-30 MED ORDER — PROMETHAZINE HCL 25 MG/ML IJ SOLN
12.5000 mg | INTRAMUSCULAR | Status: DC | PRN
  Administered 2012-09-30: 10:00:00 via INTRAVENOUS
  Administered 2012-09-30 (×2): 12.5 mg via INTRAVENOUS
  Filled 2012-09-30 (×3): qty 1

## 2012-09-30 MED ORDER — IOHEXOL 350 MG/ML SOLN
100.0000 mL | Freq: Once | INTRAVENOUS | Status: AC | PRN
  Administered 2012-09-30: 100 mL via INTRAVENOUS

## 2012-09-30 MED ORDER — CIPROFLOXACIN IN D5W 400 MG/200ML IV SOLN
400.0000 mg | Freq: Two times a day (BID) | INTRAVENOUS | Status: DC
  Administered 2012-09-30 – 2012-10-04 (×8): 400 mg via INTRAVENOUS
  Filled 2012-09-30 (×10): qty 200

## 2012-09-30 MED ORDER — ONDANSETRON HCL 4 MG/2ML IJ SOLN
INTRAMUSCULAR | Status: AC
  Filled 2012-09-30: qty 2

## 2012-09-30 MED ORDER — PANTOPRAZOLE SODIUM 40 MG IV SOLR
40.0000 mg | Freq: Every day | INTRAVENOUS | Status: DC
  Administered 2012-09-30 – 2012-10-04 (×5): 40 mg via INTRAVENOUS
  Filled 2012-09-30 (×7): qty 40

## 2012-09-30 MED ORDER — OXYCODONE HCL ER 10 MG PO T12A
10.0000 mg | EXTENDED_RELEASE_TABLET | Freq: Two times a day (BID) | ORAL | Status: DC
  Administered 2012-09-30 – 2012-10-01 (×2): 10 mg via ORAL
  Filled 2012-09-30 (×2): qty 1
  Filled 2012-09-30: qty 2

## 2012-09-30 MED ORDER — OXYCODONE HCL 20 MG PO TB12
20.0000 mg | ORAL_TABLET | Freq: Three times a day (TID) | ORAL | Status: DC
  Administered 2012-09-30: 20 mg via ORAL
  Filled 2012-09-30 (×5): qty 1

## 2012-09-30 MED ORDER — CYCLOBENZAPRINE HCL 10 MG PO TABS
10.0000 mg | ORAL_TABLET | Freq: Two times a day (BID) | ORAL | Status: DC | PRN
  Administered 2012-09-30 – 2012-10-11 (×13): 10 mg via ORAL
  Filled 2012-09-30 (×12): qty 1

## 2012-09-30 MED ORDER — THYROID 30 MG PO TABS
150.0000 mg | ORAL_TABLET | Freq: Every day | ORAL | Status: DC
  Administered 2012-09-30 – 2012-10-03 (×3): 150 mg via ORAL
  Filled 2012-09-30 (×5): qty 1

## 2012-09-30 MED ORDER — OXYCODONE HCL 10 MG PO TB12: 10.0000 mg | ORAL_TABLET | Freq: Two times a day (BID) | ORAL | Status: DC

## 2012-09-30 MED ORDER — HYDROCHLOROTHIAZIDE 12.5 MG PO CAPS
12.5000 mg | ORAL_CAPSULE | Freq: Every day | ORAL | Status: DC
  Administered 2012-09-30: 12.5 mg via ORAL
  Filled 2012-09-30 (×3): qty 1

## 2012-09-30 MED ORDER — IPRATROPIUM-ALBUTEROL 18-103 MCG/ACT IN AERO
2.0000 | INHALATION_SPRAY | Freq: Four times a day (QID) | RESPIRATORY_TRACT | Status: DC | PRN
  Filled 2012-09-30: qty 14.7

## 2012-09-30 MED ORDER — LISINOPRIL 10 MG PO TABS
10.0000 mg | ORAL_TABLET | Freq: Two times a day (BID) | ORAL | Status: DC
  Administered 2012-09-30 – 2012-10-01 (×3): 10 mg via ORAL
  Filled 2012-09-30 (×6): qty 1

## 2012-09-30 MED ORDER — BISOPROLOL FUMARATE 10 MG PO TABS
10.0000 mg | ORAL_TABLET | Freq: Every day | ORAL | Status: DC
  Administered 2012-09-30 – 2012-10-02 (×2): 10 mg via ORAL
  Filled 2012-09-30 (×3): qty 1

## 2012-09-30 MED ORDER — HYDROMORPHONE HCL PF 1 MG/ML IJ SOLN
1.0000 mg | INTRAMUSCULAR | Status: DC | PRN
  Administered 2012-09-30 – 2012-10-02 (×12): 1 mg via INTRAVENOUS
  Filled 2012-09-30 (×13): qty 1

## 2012-09-30 NOTE — ED Notes (Signed)
Pt reports no decrease in chest pain, MD made aware and to follow up. Visitor at bedside and will continue to monitor pt. Pt HOB adjusted for pt comfort.

## 2012-09-30 NOTE — ED Notes (Signed)
Pt states "the pain in my chest has defused and its gone lower now".

## 2012-09-30 NOTE — ED Notes (Signed)
Dr. Tseui at the bedside. 

## 2012-09-30 NOTE — ED Notes (Signed)
Dr. Hyacinth Meeker into room, at [facility].

## 2012-09-30 NOTE — Consult Note (Signed)
[facility] INPATIENT PROGRESS NOTE  Name: [name]      MRN: [id number]    Location: A08C/A08C  Date: 09/30/2012 Time:10:22 AM   Subjective: Interval History:Enrique W Darin abdominal pain the last 24 hours.  Pain is mid epigastric and radiating to RUQ.  She feels warm but no objective fever.  She denied headache, mucositis.  She has nausea but no vomiting.  She denied SOB, chest pain, visible bleeding symptoms.  The rest of the 14 point review of system was negative.   Objective: Vital signs in last 24 hours: Temp:  [97.5 F (36.4 C)] 97.5 F (36.4 C) (07/05 0036) Pulse Rate:  [50-95] 80 (07/05 0930) Resp:  [12-23] 17 (07/05 0930) BP: (140-168)/(36-96) 140/96 mmHg (07/05 0930) SpO2:  [91 %-100 %] 91 % (07/05 0930)     PHYSICAL EXAM: Gen: Well-nourished woman, distressed from abdominal pain. Eyes: No scleral icterus or jaundice. ENT: There was no oropharyngeal lesions. Neck was supple without thyromegaly. Lymphatics: Negative for cervical, supraclavicular, axillary, or inguinal adenopathy.  Respiratory: Lungs were clear bilaterally without wheezing or crackles. Cardiovascular: normal heart rate and rhythm; S1/S2; without murmur, rubs, or gallop. There was no pedal edema. GI: Abdomen was soft, flat, nondistended, RUQ discomfort.  Musculoskeletal exam: No spinal tenderness on palpation of vertebral spine. Skin exam was without ecchymosis, petechiae. Neuro exam was nonfocal. Patient was alert and oriented. Attention was good. Language was appropriate. Mood was normal without depression. Speech was not pressured. Thought content was not tangential.        Studies/Results: Results for orders placed during the hospital encounter of 09/30/12 (from the past 48 hour(s))  COMPREHENSIVE METABOLIC PANEL     Status: Abnormal   Collection Time    09/30/12 12:39 AM      Result Value Range   Sodium 135  135 - 145 mEq/L   Potassium 3.3 (*) 3.5 - 5.1 mEq/L   Chloride 95 (*) 96 - 112  mEq/L   CO2 31  19 - 32 mEq/L   Glucose, Bld 123 (*) 70 - 99 mg/dL   BUN 9  6 - 23 mg/dL   Creatinine, Ser 3.08  0.50 - 1.10 mg/dL   Calcium 8.9  8.4 - 65.7 mg/dL   Total Protein 6.6  6.0 - 8.3 g/dL   Albumin 3.5  3.5 - 5.2 g/dL   AST 15  0 - 37 U/L   ALT 6  0 - 35 U/L   Alkaline Phosphatase 52  39 - 117 U/L   Total Bilirubin 0.3  0.3 - 1.2 mg/dL   GFR calc non Af Amer 71 (*) >90 mL/min   GFR calc Af Amer 83 (*) >90 mL/min   Comment:            The eGFR has been calculated     using the CKD EPI equation.     This calculation has not been     validated in all clinical     situations.     eGFR's persistently     <90 mL/min signify     possible Chronic Kidney Disease.  LIPASE, BLOOD     Status: None   Collection Time    09/30/12 12:39 AM      Result Value Range   Lipase 23  11 - 59 U/L  POCT I-STAT TROPONIN I     Status: None   Collection Time    09/30/12 12:57 AM      Result Value  Range   Troponin i, poc 0.02  0.00 - 0.08 ng/mL   Comment 3            Comment: Due to the release kinetics of cTnI,     a negative result within the first hours     of the onset of symptoms does not rule out     myocardial infarction with certainty.     If myocardial infarction is still suspected,     repeat the test at appropriate intervals.  POCT I-STAT, CHEM 8     Status: Abnormal   Collection Time    09/30/12  1:15 AM      Result Value Range   Sodium 137  135 - 145 mEq/L   Potassium 3.4 (*) 3.5 - 5.1 mEq/L   Chloride 94 (*) 96 - 112 mEq/L   BUN 9  6 - 23 mg/dL   Creatinine, Ser 9.81  0.50 - 1.10 mg/dL   Glucose, Bld 191 (*) 70 - 99 mg/dL   Calcium, Ion 4.78 (*) 1.13 - 1.30 mmol/L   TCO2 32  0 - 100 mmol/L   Hemoglobin 8.2 (*) 12.0 - 15.0 g/dL   HCT 29.5 (*) 62.1 - 30.8 %  CBC WITH DIFFERENTIAL     Status: Abnormal   Collection Time    09/30/12  5:35 AM      Result Value Range   WBC 7.4  4.0 - 10.5 K/uL   RBC 3.75 (*) 3.87 - 5.11 MIL/uL   Hemoglobin 5.7 (*) 12.0 - 15.0 g/dL    Comment: DELTA CHECK NOTED     CRITICAL RESULT CALLED TO, READ BACK BY AND VERIFIED WITH:      TO MBROWN(RN) BY TEC 09/30/12 AT 0604   HCT 23.1 (*) 36.0 - 46.0 %   MCV 61.6 (*) 78.0 - 100.0 fL   MCH 15.2 (*) 26.0 - 34.0 pg   MCHC 24.7 (*) 30.0 - 36.0 g/dL   RDW 65.7 (*) 84.6 - 96.2 %   Platelets 202  150 - 400 K/uL   Comment: PLATELET COUNT CONFIRMED BY SMEAR   Neutrophils Relative % 90 (*) 43 - 77 %   Neutro Abs 6.6  1.7 - 7.7 K/uL   Lymphocytes Relative 5 (*) 12 - 46 %   Lymphs Abs 0.4 (*) 0.7 - 4.0 K/uL   Monocytes Relative 5  3 - 12 %   Monocytes Absolute 0.3  0.1 - 1.0 K/uL   Eosinophils Relative 0  0 - 5 %   Eosinophils Absolute 0.0  0.0 - 0.7 K/uL   Basophils Relative 0  0 - 1 %   Basophils Absolute 0.0  0.0 - 0.1 K/uL   US Abdomen Complete  09/30/2012   *RADIOLOGY REPORT*  Clinical Data:  Mid epigastric pain.   ABDOMINAL ULTRASOUND COMPLETE  Comparison:  None.  Findings:  Gallbladder:  Multiple stones are identified within the gallbladder measuring up to 8 mm.  No gallbladder wall thickening or pericholecystic fluid.  Negative sonographic Murphy's sign.  Common Bile Duct:  The common bile duct is increased in caliber measuring up to 8.2 mm.  Liver: No focal mass lesion identified.  Within normal limits in parenchymal echogenicity.  IVC:  Appears normal.  Pancreas:  No abnormality identified.  Spleen:  Within normal limits in size and echotexture.  Right kidney:  Normal in size and parenchymal echogenicity.  No evidence of mass or hydronephrosis.  Left kidney:  Normal in size and parenchymal echogenicity.  No evidence of mass or hydronephrosis.  Abdominal Aorta:  No aneurysm identified.  IMPRESSION:  1.  Gallstones. 2.  Increased caliber of the common bile duct which measures up to 8.2 mm. If there is clinical concern for choledocholithiasis an MRCP may be helpful.   Original Report Authenticated By: Signa Kell, M.D.   Dg Chest Port 1 View  09/30/2012   *RADIOLOGY REPORT*  Clinical  Data: Mid sternal chest pain since 06:00 p.m.  PORTABLE CHEST - 1 VIEW  Comparison: 04/01/2008  Findings: The heart size and pulmonary vascularity are normal. The lungs appear clear and expanded without focal air space disease or consolidation. No blunting of the costophrenic angles.  No pneumothorax.  Mediastinal contours appear intact.  No significant change since previous study.  IMPRESSION: No evidence of active pulmonary disease.   Original Report Authenticated By: Burman Nieves, M.D.   Ct Angio Chest Aorta W/cm &/or Wo/cm  09/30/2012   *RADIOLOGY REPORT*  Clinical Data:  Chest pain radiating to the back.  Epigastric pain. Nausea and vomiting.  CT ANGIOGRAPHY CHEST, ABDOMEN AND PELVIS  Technique:  Multidetector CT imaging through the chest, abdomen and pelvis was performed using the standard protocol during bolus administration of intravenous contrast.  Multiplanar reconstructed images including MIPs were obtained and reviewed to evaluate the vascular anatomy.  Contrast: OMNIPAQUE IOHEXOL 350 MG/ML SOLN  Comparison:   None.  CTA CHEST  Findings:  Noncontrast CT images of the chest demonstrate no evidence of intramural thrombus in the thoracic aorta.  Suggestion of minimal calcification in the coronary arteries.  Contrast enhanced images demonstrate normal caliber thoracic aorta. No evidence of aneurysm.  Thoracic aorta is patent without evidence of dissection or significant thrombus.  Normal appearance of the great vessels.  Visualized pulmonary arteries appear patent without evidence of central pulmonary embolus.  Normal heart size.  No significant lymphadenopathy in the chest. The esophagus is decompressed.  Thyroid gland appears atrophic.  No pleural effusions.  Visualization of the lungs is limited due to respiratory motion artifact but there appears to be a patchy mosaic pattern suggesting edema or emphysema.  No focal consolidation.  No pneumothorax.  Airways appear patent.  Degenerative changes  in the thoracic spine with normal alignment.  No destructive bone lesions are appreciated.   Review of the MIP images confirms the above findings.  IMPRESSION: No evidence of thoracic aortic dissection.  CTA ABDOMEN AND PELVIS  Findings:  Normal caliber abdominal aorta.  No aneurysm.  The abdominal aorta, celiac axis, superior mesenteric artery, bilateral single renal arteries, the inferior mesenteric artery, and bilateral iliac, common iliac, and femoral arteries appear patent. No evidence of dissection or significant thrombus.  The gallbladder is distended with suggestion of sludge or possibly layering small stones.  No wall thickening or inflammatory infiltration is suggested.  The liver, spleen, pancreas, adrenal glands, inferior vena cava, and retroperitoneal lymph nodes are unremarkable.  Sub centimeter parenchymal cysts in both kidneys. No evidence of hydronephrosis or solid mass.  The esophagus, small bowel, and colon are mostly decompressed.  No abnormal bowel distension.  No free air or free fluid in the abdomen.  Pelvis:  The uterus appears atrophic or surgically absent.  No abnormal adnexal masses.  Rectosigmoid colon is stool-filled without evidence of diverticulitis.  The appendix is normal. No free or loculated pelvic fluid collections.  No significant pelvic lymphadenopathy.  Mild degenerative changes in the lumbar spine.  Normal alignment. No destructive bone lesions appreciated.   Review of  the MIP images confirms the above findings.  IMPRESSION: No evidence of aneurysm or dissection of the abdominal aorta.  The gallbladder is distended and contains sludge or small stones.   Original Report Authenticated By: Burman Nieves, M.D.   Ct Angio Abd/pel W/ And/or W/o  09/30/2012   *RADIOLOGY REPORT*  Clinical Data:  Chest pain radiating to the back.  Epigastric pain. Nausea and vomiting.  CT ANGIOGRAPHY CHEST, ABDOMEN AND PELVIS  Technique:  Multidetector CT imaging through the chest, abdomen and  pelvis was performed using the standard protocol during bolus administration of intravenous contrast.  Multiplanar reconstructed images including MIPs were obtained and reviewed to evaluate the vascular anatomy.  Contrast: OMNIPAQUE IOHEXOL 350 MG/ML SOLN  Comparison:   None.  CTA CHEST  Findings:  Noncontrast CT images of the chest demonstrate no evidence of intramural thrombus in the thoracic aorta.  Suggestion of minimal calcification in the coronary arteries.  Contrast enhanced images demonstrate normal caliber thoracic aorta. No evidence of aneurysm.  Thoracic aorta is patent without evidence of dissection or significant thrombus.  Normal appearance of the great vessels.  Visualized pulmonary arteries appear patent without evidence of central pulmonary embolus.  Normal heart size.  No significant lymphadenopathy in the chest. The esophagus is decompressed.  Thyroid gland appears atrophic.  No pleural effusions.  Visualization of the lungs is limited due to respiratory motion artifact but there appears to be a patchy mosaic pattern suggesting edema or emphysema.  No focal consolidation.  No pneumothorax.  Airways appear patent.  Degenerative changes in the thoracic spine with normal alignment.  No destructive bone lesions are appreciated.   Review of the MIP images confirms the above findings.  IMPRESSION: No evidence of thoracic aortic dissection.  CTA ABDOMEN AND PELVIS  Findings:  Normal caliber abdominal aorta.  No aneurysm.  The abdominal aorta, celiac axis, superior mesenteric artery, bilateral single renal arteries, the inferior mesenteric artery, and bilateral iliac, common iliac, and femoral arteries appear patent. No evidence of dissection or significant thrombus.  The gallbladder is distended with suggestion of sludge or possibly layering small stones.  No wall thickening or inflammatory infiltration is suggested.  The liver, spleen, pancreas, adrenal glands, inferior vena cava, and  retroperitoneal lymph nodes are unremarkable.  Sub centimeter parenchymal cysts in both kidneys. No evidence of hydronephrosis or solid mass.  The esophagus, small bowel, and colon are mostly decompressed.  No abnormal bowel distension.  No free air or free fluid in the abdomen.  Pelvis:  The uterus appears atrophic or surgically absent.  No abnormal adnexal masses.  Rectosigmoid colon is stool-filled without evidence of diverticulitis.  The appendix is normal. No free or loculated pelvic fluid collections.  No significant pelvic lymphadenopathy.  Mild degenerative changes in the lumbar spine.  Normal alignment. No destructive bone lesions appreciated.   Review of the MIP images confirms the above findings.  IMPRESSION: No evidence of aneurysm or dissection of the abdominal aorta.  The gallbladder is distended and contains sludge or small stones.   Original Report Authenticated By: Burman Nieves, M.D.     MEDICATIONS: reviewed.     PROBLEM LIST:  1.  Gall stone.  2.  Polycythemia vera with hyperviscosity with goal Hgb less than 6.  3.  History of CAD.  4.  Chronic pain syndrome, fibromyalgia.  5.  Hyperlipidemia. 6.  HTN. 7.  Hypothyroidism.       RECOMMENDATIONS:  - Continue to monitor CBC.  Permissive anemia with Hgb  around 5.  No transfusion given history of hyperviscosity.  - Hold off on Plavix and Aspirin given need for possible cholecystectomy per Gen Surg during this admission. - Pain control per primary team.  - I will alert Dr. [name] of her admission.

## 2012-09-30 NOTE — ED Notes (Addendum)
Pt c/o CP, burning, sternal area, onset 1800, no relief with ntg x2 PTA, card MD & PCP is Dr. Patty Sermons. Also reports nv (denies: sob, dizziness or other sx), pain worse with movement. Pt alert, NAD, calm, mildly restless, interactive, resps e/u, skin cool & dry, pale, h/o low hgb, states, "they try to keep it low". No longer taking plavix.

## 2012-09-30 NOTE — ED Notes (Signed)
MD at the bedside  

## 2012-09-30 NOTE — ED Provider Notes (Signed)
Ultrasound reviewed.  Continuing to have pain and required IV pain medicine.  General surgery and oncology consulted.  Nelia Shi, MD 09/30/12 [phone]

## 2012-09-30 NOTE — ED Provider Notes (Signed)
History    CSN: [id number] Arrival date & time 09/30/12  0032  First MD Initiated Contact with Patient 09/30/12 0034     Chief Complaint  Patient presents with  . Chest Pain   (Consider location/radiation/quality/duration/timing/severity/associated sxs/prior Treatment) HPI Comments: Pt with hx of P. Vera - gets frequent blood draws and according to hem onc notes by Dr. [name] - likes to keep Hgb around 6.  She states that at 6 PM, she had acute onset of CP which is located in the lower sternal area.  Constant, severe and worse than any pain that she has ever had in the past - she has associated n/v.  No swelling, no cough or sob.  She has no history of CAD per Dr. Yevonne Pax notes in the MR.  She has hx of GERD but it does not feel like this - this pain does radiate to her lower back    The history is provided by the patient, medical records and a relative.   Past Medical History  Diagnosis Date  . 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 Fibromyalgia   . Hypothyroidism   . Essential hypertension   . Postmenopausal state     on hormone replacement therapy  . Aortic valve sclerosis     by echocardiogram 12/04/2009  . PAC (premature atrial contraction)     history of  . Coronary artery disease   . Hyperlipidemia   . History of echocardiogram 12/04/2009    showed mild ventricle hypertrophy and normal EF at 55-60% -- mild aortic valve sclerosis  -- trace mitral regurgitation -- slightly pulmonary artery pressure at 47  --  [name], M.D.   . Fatigue    History reviewed. No pertinent past surgical history. Family History  Problem Relation Age of Onset  . Heart attack Father   . Coronary artery disease Mother     had aortic valve replacement   History  Substance Use Topics  . Smoking status: Never Smoker   . Smokeless tobacco: Not on file  . Alcohol Use: No   OB History   Grav Para Term Preterm Abortions TAB SAB Ect Mult Living                  Review of Systems  All other systems reviewed and are negative.    Allergies  Calcium channel blockers; Codeine; Penicillins; and Synthroid  Home Medications   Current Outpatient Rx  Name  Route  Sig  Dispense  Refill  . albuterol-ipratropium (COMBIVENT) 18-103 MCG/ACT inhaler   Inhalation   Inhale 2 puffs into the lungs every 6 (six) hours as needed for wheezing.         Marland Kitchen ALPRAZolam (XANAX) 0.5 MG tablet   Oral   Take 1 tablet (0.5 mg total) by mouth every 6 (six) hours as needed.   120 tablet   5   . aspirin 325 MG tablet   Oral   Take 325 mg by mouth daily.           . bisoprolol (ZEBETA) 10 MG tablet   Oral   Take 10 mg by mouth daily.         . cholecalciferol (VITAMIN D) 1000 UNITS tablet   Oral   Take 3,000 Units by mouth daily.          . clopidogrel (PLAVIX) 75 MG tablet   Oral   Take 75 mg by mouth daily.         Marland Kitchen  cyclobenzaprine (FLEXERIL) 10 MG tablet   Oral   Take 10 mg by mouth 3 (three) times daily as needed for muscle spasms.          . diphenhydrAMINE (BENADRYL) 25 MG tablet   Oral   Take 50 mg by mouth every 8 (eight) hours as needed.          Marland Kitchen DOCUSATE SODIUM PO   Oral   Take 1 tablet by mouth at bedtime.           Marland Kitchen estrogens, conjugated, (PREMARIN) 0.625 MG tablet   Oral   Take 0.625 mg by mouth 2 (two) times daily.           . famotidine (PEPCID) 20 MG tablet   Oral   Take 1 tablet (20 mg total) by mouth 2 (two) times daily.   60 tablet   11   . fexofenadine (ALLEGRA) 180 MG tablet   Oral   Take 180 mg by mouth daily.           . fish oil-omega-3 fatty acids 1000 MG capsule   Oral   Take 1 g by mouth daily.           . fluoxymesterone (ANDROXY) 10 MG tablet   Oral   Take 2.5 mg by mouth daily.          . furosemide (LASIX) 40 MG tablet   Oral   Take 1 tablet (40 mg total) by mouth 2 (two) times daily.   60 tablet   11   . Garlic Oil 1000 MG CAPS   Oral   Take 1 capsule by mouth daily.           . hydrochlorothiazide (MICROZIDE) 12.5 MG capsule   Oral   Take 1 capsule (12.5 mg total) by mouth daily.   30 capsule   6   . lidocaine-prilocaine (EMLA) cream   Topical   Apply topically as needed. Apply to phlebotomy site at least one hour prior to phlebotomy.   30 g   0   . lisinopril (PRINIVIL,ZESTRIL) 10 MG tablet   Oral   Take 10 mg by mouth 2 (two) times daily.          . magnesium oxide (MAG-OX) 400 MG tablet   Oral   Take 1,200 mg by mouth daily.           Marland Kitchen oxyCODONE (OXYCONTIN) 10 MG 12 hr tablet   Oral   Take 1 tablet (10 mg total) by mouth every 12 (twelve) hours. Brand name medically necessary.   60 tablet   0     Dispense as written.   Marland Kitchen oxyCODONE (OXYCONTIN) 20 MG 12 hr tablet   Oral   Take 1 tablet (20 mg total) by mouth every 8 (eight) hours. Brand name medically necessary.   90 tablet   0     Dispense as written.   . potassium chloride (K-DUR) 10 MEQ tablet   Oral   Take 80 mEq by mouth daily.         . promethazine (PHENERGAN) 25 MG tablet   Oral   Take 25 mg by mouth every 8 (eight) hours as needed for nausea.          Marland Kitchen ROXICODONE 15 MG immediate release tablet   Oral   Take 1 tablet (15 mg total) by mouth every 8 (eight) hours as needed for pain. Brand name medically necessary.   90 tablet  0     Dispense as written.   . Sennosides (SENNA LAX PO)   Oral   Take 1 tablet by mouth at bedtime.           . temazepam (RESTORIL) 30 MG capsule   Oral   Take 30 mg by mouth at bedtime as needed for sleep.         Marland Kitchen thyroid (ARMOUR) 60 MG tablet   Oral   Take 150 mg by mouth daily before breakfast.         . Valerian 100 MG CAPS   Oral   Take 1 capsule by mouth daily.           BP 167/75  Pulse 64  Temp(Src) 97.5 F (36.4 C) (Oral)  Resp 19  SpO2 98% Physical Exam  Nursing note and vitals reviewed. Constitutional: She appears well-developed and well-nourished.  Uncomfortable appearing  HENT:   Head: Normocephalic and atraumatic.  Mouth/Throat: Oropharynx is clear and moist. No oropharyngeal exudate.  MM dry  Eyes: EOM are normal. Pupils are equal, round, and reactive to light. Right eye exhibits no discharge. Left eye exhibits no discharge. No scleral icterus.  Pale conjunctivae  Neck: Normal range of motion. Neck supple. No JVD present. No thyromegaly present.  Cardiovascular: Regular rhythm, normal heart sounds and intact distal pulses.  Exam reveals no gallop and no friction rub.   No murmur heard. Bradycardia to 50  Pulmonary/Chest: Effort normal and breath sounds normal. No respiratory distress. She has no wheezes. She has no rales. She exhibits no tenderness.  Abdominal: Soft. Bowel sounds are normal. She exhibits no distension and no mass. There is tenderness ( mild epigastric ttp, not same as her underlying pain).  No other abd ttp. No guarding and no peritoneal signs  Musculoskeletal: Normal range of motion. She exhibits no edema and no tenderness.  Lymphadenopathy:    She has no cervical adenopathy.  Neurological: She is alert. Coordination normal.  Skin: Skin is warm and dry. No rash noted. No erythema.  Psychiatric: She has a normal mood and affect. Her behavior is normal.    ED Course  Procedures (including critical care time) Labs Reviewed  COMPREHENSIVE METABOLIC PANEL - Abnormal; Notable for the following:    Potassium 3.3 (*)    Chloride 95 (*)    Glucose, Bld 123 (*)    GFR calc non Af Amer 71 (*)    GFR calc Af Amer 83 (*)    All other components within normal limits  CBC WITH DIFFERENTIAL - Abnormal; Notable for the following:    RBC 3.75 (*)    Hemoglobin 5.7 (*)    HCT 23.1 (*)    MCV 61.6 (*)    MCH 15.2 (*)    MCHC 24.7 (*)    RDW 21.6 (*)    Neutrophils Relative % 90 (*)    Lymphocytes Relative 5 (*)    Lymphs Abs 0.4 (*)    All other components within normal limits  POCT I-STAT, CHEM 8 - Abnormal; Notable for the following:     Potassium 3.4 (*)    Chloride 94 (*)    Glucose, Bld 125 (*)    Calcium, Ion 1.10 (*)    Hemoglobin 8.2 (*)    HCT 24.0 (*)    All other components within normal limits  LIPASE, BLOOD  CBC WITH DIFFERENTIAL  POCT I-STAT TROPONIN I   Dg Chest Port 1 View  09/30/2012   *RADIOLOGY REPORT*  Clinical Data: Mid sternal chest pain since 06:00 p.m.  PORTABLE CHEST - 1 VIEW  Comparison: 04/01/2008  Findings: The heart size and pulmonary vascularity are normal. The lungs appear clear and expanded without focal air space disease or consolidation. No blunting of the costophrenic angles.  No pneumothorax.  Mediastinal contours appear intact.  No significant change since previous study.  IMPRESSION: No evidence of active pulmonary disease.   Original Report Authenticated By: Burman Nieves, M.D.   Ct Angio Chest Aorta W/cm &/or Wo/cm  09/30/2012   *RADIOLOGY REPORT*  Clinical Data:  Chest pain radiating to the back.  Epigastric pain. Nausea and vomiting.  CT ANGIOGRAPHY CHEST, ABDOMEN AND PELVIS  Technique:  Multidetector CT imaging through the chest, abdomen and pelvis was performed using the standard protocol during bolus administration of intravenous contrast.  Multiplanar reconstructed images including MIPs were obtained and reviewed to evaluate the vascular anatomy.  Contrast: OMNIPAQUE IOHEXOL 350 MG/ML SOLN  Comparison:   None.  CTA CHEST  Findings:  Noncontrast CT images of the chest demonstrate no evidence of intramural thrombus in the thoracic aorta.  Suggestion of minimal calcification in the coronary arteries.  Contrast enhanced images demonstrate normal caliber thoracic aorta. No evidence of aneurysm.  Thoracic aorta is patent without evidence of dissection or significant thrombus.  Normal appearance of the great vessels.  Visualized pulmonary arteries appear patent without evidence of central pulmonary embolus.  Normal heart size.  No significant lymphadenopathy in the chest. The esophagus is  decompressed.  Thyroid gland appears atrophic.  No pleural effusions.  Visualization of the lungs is limited due to respiratory motion artifact but there appears to be a patchy mosaic pattern suggesting edema or emphysema.  No focal consolidation.  No pneumothorax.  Airways appear patent.  Degenerative changes in the thoracic spine with normal alignment.  No destructive bone lesions are appreciated.   Review of the MIP images confirms the above findings.  IMPRESSION: No evidence of thoracic aortic dissection.  CTA ABDOMEN AND PELVIS  Findings:  Normal caliber abdominal aorta.  No aneurysm.  The abdominal aorta, celiac axis, superior mesenteric artery, bilateral single renal arteries, the inferior mesenteric artery, and bilateral iliac, common iliac, and femoral arteries appear patent. No evidence of dissection or significant thrombus.  The gallbladder is distended with suggestion of sludge or possibly layering small stones.  No wall thickening or inflammatory infiltration is suggested.  The liver, spleen, pancreas, adrenal glands, inferior vena cava, and retroperitoneal lymph nodes are unremarkable.  Sub centimeter parenchymal cysts in both kidneys. No evidence of hydronephrosis or solid mass.  The esophagus, small bowel, and colon are mostly decompressed.  No abnormal bowel distension.  No free air or free fluid in the abdomen.  Pelvis:  The uterus appears atrophic or surgically absent.  No abnormal adnexal masses.  Rectosigmoid colon is stool-filled without evidence of diverticulitis.  The appendix is normal. No free or loculated pelvic fluid collections.  No significant pelvic lymphadenopathy.  Mild degenerative changes in the lumbar spine.  Normal alignment. No destructive bone lesions appreciated.   Review of the MIP images confirms the above findings.  IMPRESSION: No evidence of aneurysm or dissection of the abdominal aorta.  The gallbladder is distended and contains sludge or small stones.   Original Report  Authenticated By: Burman Nieves, M.D.   Ct Angio Abd/pel W/ And/or W/o  09/30/2012   *RADIOLOGY REPORT*  Clinical Data:  Chest pain radiating to the back.  Epigastric pain. Nausea and vomiting.  CT ANGIOGRAPHY CHEST, ABDOMEN AND PELVIS  Technique:  Multidetector CT imaging through the chest, abdomen and pelvis was performed using the standard protocol during bolus administration of intravenous contrast.  Multiplanar reconstructed images including MIPs were obtained and reviewed to evaluate the vascular anatomy.  Contrast: OMNIPAQUE IOHEXOL 350 MG/ML SOLN  Comparison:   None.  CTA CHEST  Findings:  Noncontrast CT images of the chest demonstrate no evidence of intramural thrombus in the thoracic aorta.  Suggestion of minimal calcification in the coronary arteries.  Contrast enhanced images demonstrate normal caliber thoracic aorta. No evidence of aneurysm.  Thoracic aorta is patent without evidence of dissection or significant thrombus.  Normal appearance of the great vessels.  Visualized pulmonary arteries appear patent without evidence of central pulmonary embolus.  Normal heart size.  No significant lymphadenopathy in the chest. The esophagus is decompressed.  Thyroid gland appears atrophic.  No pleural effusions.  Visualization of the lungs is limited due to respiratory motion artifact but there appears to be a patchy mosaic pattern suggesting edema or emphysema.  No focal consolidation.  No pneumothorax.  Airways appear patent.  Degenerative changes in the thoracic spine with normal alignment.  No destructive bone lesions are appreciated.   Review of the MIP images confirms the above findings.  IMPRESSION: No evidence of thoracic aortic dissection.  CTA ABDOMEN AND PELVIS  Findings:  Normal caliber abdominal aorta.  No aneurysm.  The abdominal aorta, celiac axis, superior mesenteric artery, bilateral single renal arteries, the inferior mesenteric artery, and bilateral iliac, common iliac, and femoral  arteries appear patent. No evidence of dissection or significant thrombus.  The gallbladder is distended with suggestion of sludge or possibly layering small stones.  No wall thickening or inflammatory infiltration is suggested.  The liver, spleen, pancreas, adrenal glands, inferior vena cava, and retroperitoneal lymph nodes are unremarkable.  Sub centimeter parenchymal cysts in both kidneys. No evidence of hydronephrosis or solid mass.  The esophagus, small bowel, and colon are mostly decompressed.  No abnormal bowel distension.  No free air or free fluid in the abdomen.  Pelvis:  The uterus appears atrophic or surgically absent.  No abnormal adnexal masses.  Rectosigmoid colon is stool-filled without evidence of diverticulitis.  The appendix is normal. No free or loculated pelvic fluid collections.  No significant pelvic lymphadenopathy.  Mild degenerative changes in the lumbar spine.  Normal alignment. No destructive bone lesions appreciated.   Review of the MIP images confirms the above findings.  IMPRESSION: No evidence of aneurysm or dissection of the abdominal aorta.  The gallbladder is distended and contains sludge or small stones.   Original Report Authenticated By: Burman Nieves, M.D.   No diagnosis found. On a MDM  The pt had large amount of vomit of stomach contents / food on initial arrival - she does not have reproducible pain on my exam, she has no fever and has mild hypertension - labs pending, CXR, ECG is non ischemic, pain meds and nausea meds ordered.  ED ECG REPORT  I personally interpreted this EKG   Date: 09/30/2012   Rate: 48  Rhythm: sinus bradycardia  QRS Axis: normal  Intervals: normal  ST/T Wave abnormalities: normal  Conduction Disutrbances:none  Narrative Interpretation:   Old EKG Reviewed: c/w 04/02/08, no sig changes - rate slower  Pt is on BB likely accounting for her bradycardia.    I have given the pt several doses of IV dilaudid - 0.5mg  each, she has had minimal  relief -  she now tells me that she is on 30mg /20mg /30mg  of Oxycontin a day tid respectively - will give larger dose of dilaudid.  She states that the pain has moved from the lower chest area to the RUQ / mid abd - on reexam she has ttp in this area and has some guarding.  The CT scan shows that she has no dissection / no AAA but does have gall bladder distention and sludge / small stones but no other thickening or fluid etc..    [place] ordered.   Change of shift - care signed out to Dr. Radford Pax.  Vida Roller, MD 09/30/12 [phone]

## 2012-09-30 NOTE — Consult Note (Signed)
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  Will hold aspirin - use Lovenox, but will hold prior to surgery  Admit for IV hydration/ IV abx/ pain control  Probable lap chole with IOC this admission (?Monday) 1. Cholelithiases; Surgery per Dr Corliss Skains ( Surgeon) 2. HTN; controlled on current meds 3. PCV; per surgery will leave Hg at current level  4.  CAD; per Pt's Cardiologist note fm 7/2/2012She does not have a history of known ischemic heart disease. She had an echocardiogram in 12/04/09 which showed normal left ventricular systolic function with an ejection fraction of 55-60% and normal diastolic function and mild aortic sclerosis. Currently no SSx of ACS( Troponin i x 1 negative). Obtain CE x 3. Slight change most likely 2dary to uncontrolled pain. 5.  Hypothyroidism; per patient and husband unable to take Synthroid. On Armour Thyroid 120 mg daily (patient has home meds) 6. Fibromyalgia; on Home med regimen 4. Bowel Regimen; Pt and Husband very concerned she is not on Home regimen. If Pt's abd pain becomes controlled would recommend starting Docusate Sodium 50 mg TID, and Senna 15 mg BID in step wise fashoin, otherwise counterindicated    5.   I will followup again tomorrow. Please contact me if I can be of assistance in the meanwhile. Thank you for this consultation.  Chief Complaint: Abdominal pain, nausea and  vomiting  67 yo WF PMHx (on chronic opioids and benzodiazepines), PCV managed by (Dr. [name], hematologist oncologist) treated with phlebotomy goal Hgb between 5 and 6, fibromyalgia with chronic pain, CVA, CAD, HTN, presents with acute onset of epigastric pain radiating through to her back, nausea, vomiting, abdominal distention after attending a neighbors cookout where she consumed hamburgers, baked beans, potato chips. She was brought to the ED, where she was ruled out for any cardiac event. CTA showed no sign of ischemic events in the chest or abd/pelvis. She does have significant gallstones with increased CBD diameter (8.2 mm). Her pain is not well-controlled with IV Dilaudid, although she does take very large narcotic doses at baseline. TODAY patient in extreme pain rated at 10/10 even though she had already received 12.5 mg of Phenergan plus Dilaudid 1 mg IV. After patient was given an additional 12.5 mg of Phenergan plus additional 1 mg of IV Dilaudid patient appear to be much more comfortable and able to interact appropriately.   Abd [place] 09/30/2012:  1. Gallstones.  2. Increased caliber of the common bile duct which measures up to  8.2 mm. Abd CTA 09/30/2012 No evidence of aneurysm or dissection of the abdominal aorta. The  gallbladder is distended and contains sludge or small stones.  CXR 09/30/2012 No evidence of active pulmonary disease.     Review of Systems: Negative chest pain, negative shortness of breath, positive excruciating abdominal rated 10/10   Past Medical History  Diagnosis Date  . 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 Fibromyalgia   . Hypothyroidism   . Essential hypertension   .  Postmenopausal state     on hormone replacement therapy  . Aortic valve sclerosis     by echocardiogram 12/04/2009  . PAC (premature atrial contraction)     history of  . Coronary artery disease   . Hyperlipidemia   . History of echocardiogram 12/04/2009    showed mild  ventricle hypertrophy and normal EF at 55-60% -- mild aortic valve sclerosis  -- trace mitral regurgitation -- slightly pulmonary artery pressure at 47  --  [name], M.D.   . Fatigue    History reviewed. No pertinent past surgical history. Social History:  reports that she has never smoked. She does not have any smokeless tobacco history on file. She reports that she does not drink alcohol or use illicit drugs.  Allergies  Allergen Reactions  . Calcium Channel Blockers   . Codeine   . Penicillins   . Synthroid (Levothyroxine Sodium)    Family History  Problem Relation Age of Onset  . Heart attack Father   . Coronary artery disease Mother     had aortic valve replacement    Prior to Admission medications   Medication Sig Start Date End Date Taking? Authorizing Provider  albuterol-ipratropium (COMBIVENT) 18-103 MCG/ACT inhaler Inhale 2 puffs into the lungs every 6 (six) hours as needed for wheezing.   Yes Historical Provider, MD  ALPRAZolam [name]) 0.5 MG tablet Take 1 tablet (0.5 mg total) by mouth every 6 (six) hours as needed. 06/27/12  Yes Josph Macho, MD  aspirin 325 MG tablet Take 325 mg by mouth daily.     Yes Historical Provider, MD  bisoprolol (ZEBETA) 10 MG tablet Take 10 mg by mouth daily.   Yes Historical Provider, MD  cholecalciferol (VITAMIN D) 1000 UNITS tablet Take 3,000 Units by mouth daily.    Yes Historical Provider, MD  clopidogrel (PLAVIX) 75 MG tablet Take 75 mg by mouth daily.   Yes Historical Provider, MD  cyclobenzaprine (FLEXERIL) 10 MG tablet Take 10 mg by mouth 3 (three) times daily as needed for muscle spasms.    Yes Historical Provider, MD  diphenhydrAMINE (BENADRYL) 25 MG tablet Take 50 mg by mouth every 8 (eight) hours as needed.    Yes Historical Provider, MD  DOCUSATE SODIUM PO Take 1 tablet by mouth at bedtime.     Yes Historical Provider, MD  estrogens, conjugated, (PREMARIN) 0.625 MG tablet Take 0.625 mg by mouth 2 (two) times daily.      Yes Historical Provider, MD  famotidine (PEPCID) 20 MG tablet Take 1 tablet (20 mg total) by mouth 2 (two) times daily. 07/03/12  Yes [name], MD  fexofenadine (ALLEGRA) 180 MG tablet Take 180 mg by mouth daily.     Yes Historical Provider, MD  fish oil-omega-3 fatty acids 1000 MG capsule Take 1 g by mouth daily.     Yes Historical Provider, MD  fluoxymesterone (ANDROXY) 10 MG tablet Take 2.5 mg by mouth daily.    Yes Historical Provider, MD  furosemide (LASIX) 40 MG tablet Take 1 tablet (40 mg total) by mouth 2 (two) times daily. 09/07/12  Yes [name], MD  Garlic Oil 1000 MG CAPS Take 1 capsule by mouth daily.    Yes Historical Provider, MD  hydrochlorothiazide (MICROZIDE) 12.5 MG capsule Take 1 capsule (12.5 mg total) by mouth daily. 03/08/12  Yes [name], MD  lidocaine-prilocaine (EMLA) cream Apply topically as needed. Apply to phlebotomy site at least one hour prior to phlebotomy. 01/20/12  Yes Rose Phi  Ennever, MD  lisinopril (PRINIVIL,ZESTRIL) 10 MG tablet Take 10 mg by mouth 2 (two) times daily.  06/29/11  Yes [name], MD  magnesium oxide (MAG-OX) 400 MG tablet Take 1,200 mg by mouth daily.     Yes Historical Provider, MD  oxyCODONE (OXYCONTIN) 10 MG 12 hr tablet Take 1 tablet (10 mg total) by mouth every 12 (twelve) hours. Brand name medically necessary. 09/27/12  Yes Josph Macho, MD  oxyCODONE (OXYCONTIN) 20 MG 12 hr tablet Take 1 tablet (20 mg total) by mouth every 8 (eight) hours. Brand name medically necessary. 09/27/12  Yes Josph Macho, MD  potassium chloride (K-DUR) 10 MEQ tablet Take 80 mEq by mouth daily.   Yes Historical Provider, MD  promethazine (PHENERGAN) 25 MG tablet Take 25 mg by mouth every 8 (eight) hours as needed for nausea.    Yes Historical Provider, MD  ROXICODONE 15 MG immediate release tablet Take 1 tablet (15 mg total) by mouth every 8 (eight) hours as needed for pain. Brand name medically necessary. 09/27/12 01/10/13 Yes Josph Macho, MD  Sennosides (SENNA LAX PO) Take 1 tablet by mouth at bedtime.     Yes Historical Provider, MD  temazepam (RESTORIL) 30 MG capsule Take 30 mg by mouth at bedtime as needed for sleep.   Yes Historical Provider, MD  thyroid (ARMOUR) 60 MG tablet Take 150 mg by mouth daily before breakfast.   Yes Historical Provider, MD  Valerian 100 MG CAPS Take 1 capsule by mouth daily.    Yes Historical Provider, MD   Physical Exam: Blood pressure 154/63, pulse 76, temperature 97.5 F (36.4 C), temperature source Oral, resp. rate 18, SpO2 93.00%. Filed Vitals:   09/30/12 0630 09/30/12 0730 09/30/12 0737 09/30/12 0800  BP: 152/72 155/79 155/79 154/63  Pulse: 77 65  76  Temp:      TempSrc:      Resp: 17 19 18 18   SpO2: 91% 100% 97% 93%     General: Alert, AD  Eyes: PERRLA  Cardiovascular: Regular rhythm and rate, negative murmurs rubs or gallops, DP/PT pulse 2+  Respiratory: Clear to auscultation bilateral  Abdomen: Soft moderate to severe tenderness RUQ/epigastric area  Psychiatric: Anxious   Labs on Admission:  Basic Metabolic Panel:  Recent Labs Lab 09/30/12 0039 09/30/12 0115  NA 135 137  K 3.3* 3.4*  CL 95* 94*  CO2 31  --   GLUCOSE 123* 125*  BUN 9 9  CREATININE 0.82 1.00  CALCIUM 8.9  --    Liver Function Tests:  Recent Labs Lab 09/30/12 0039  AST 15  ALT 6  ALKPHOS 52  BILITOT 0.3  PROT 6.6  ALBUMIN 3.5    Recent Labs Lab 09/30/12 0039  LIPASE 23   No results found for this basename: AMMONIA,  in the last 168 hours CBC:  Recent Labs Lab 09/27/12 1303 09/30/12 0115 09/30/12 0535  WBC 4.0  --  7.4  NEUTROABS 2.3  --  6.6  HGB 5.7* 8.2* 5.7*  HCT 24.2* 24.0* 23.1*  MCV 64*  --  61.6*  PLT 225 Large platelets present  --  202   Cardiac Enzymes: No results found for this basename: CKTOTAL, CKMB, CKMBINDEX, TROPONINI,  in the last 168 hours BNP: No components found with this basename: POCBNP,  CBG: No results found for this basename:  GLUCAP,  in the last 168 hours  Radiological Exams on Admission: US Abdomen Complete  09/30/2012   *RADIOLOGY REPORT*  Clinical  Data:  Mid epigastric pain.   ABDOMINAL ULTRASOUND COMPLETE  Comparison:  None.  Findings:  Gallbladder:  Multiple stones are identified within the gallbladder measuring up to 8 mm.  No gallbladder wall thickening or pericholecystic fluid.  Negative sonographic Murphy's sign.  Common Bile Duct:  The common bile duct is increased in caliber measuring up to 8.2 mm.  Liver: No focal mass lesion identified.  Within normal limits in parenchymal echogenicity.  IVC:  Appears normal.  Pancreas:  No abnormality identified.  Spleen:  Within normal limits in size and echotexture.  Right kidney:  Normal in size and parenchymal echogenicity.  No evidence of mass or hydronephrosis.  Left kidney:  Normal in size and parenchymal echogenicity.  No evidence of mass or hydronephrosis.  Abdominal Aorta:  No aneurysm identified.  IMPRESSION:  1.  Gallstones. 2.  Increased caliber of the common bile duct which measures up to 8.2 mm. If there is clinical concern for choledocholithiasis an MRCP may be helpful.   Original Report Authenticated By: Signa Kell, M.D.   Dg Chest Port 1 View  09/30/2012   *RADIOLOGY REPORT*  Clinical Data: Mid sternal chest pain since 06:00 p.m.  PORTABLE CHEST - 1 VIEW  Comparison: 04/01/2008  Findings: The heart size and pulmonary vascularity are normal. The lungs appear clear and expanded without focal air space disease or consolidation. No blunting of the costophrenic angles.  No pneumothorax.  Mediastinal contours appear intact.  No significant change since previous study.  IMPRESSION: No evidence of active pulmonary disease.   Original Report Authenticated By: Burman Nieves, M.D.   Ct Angio Chest Aorta W/cm &/or Wo/cm  09/30/2012   *RADIOLOGY REPORT*  Clinical Data:  Chest pain radiating to the back.  Epigastric pain. Nausea and vomiting.  CT ANGIOGRAPHY CHEST, ABDOMEN  AND PELVIS  Technique:  Multidetector CT imaging through the chest, abdomen and pelvis was performed using the standard protocol during bolus administration of intravenous contrast.  Multiplanar reconstructed images including MIPs were obtained and reviewed to evaluate the vascular anatomy.  Contrast: OMNIPAQUE IOHEXOL 350 MG/ML SOLN  Comparison:   None.  CTA CHEST  Findings:  Noncontrast CT images of the chest demonstrate no evidence of intramural thrombus in the thoracic aorta.  Suggestion of minimal calcification in the coronary arteries.  Contrast enhanced images demonstrate normal caliber thoracic aorta. No evidence of aneurysm.  Thoracic aorta is patent without evidence of dissection or significant thrombus.  Normal appearance of the great vessels.  Visualized pulmonary arteries appear patent without evidence of central pulmonary embolus.  Normal heart size.  No significant lymphadenopathy in the chest. The esophagus is decompressed.  Thyroid gland appears atrophic.  No pleural effusions.  Visualization of the lungs is limited due to respiratory motion artifact but there appears to be a patchy mosaic pattern suggesting edema or emphysema.  No focal consolidation.  No pneumothorax.  Airways appear patent.  Degenerative changes in the thoracic spine with normal alignment.  No destructive bone lesions are appreciated.   Review of the MIP images confirms the above findings.  IMPRESSION: No evidence of thoracic aortic dissection.  CTA ABDOMEN AND PELVIS  Findings:  Normal caliber abdominal aorta.  No aneurysm.  The abdominal aorta, celiac axis, superior mesenteric artery, bilateral single renal arteries, the inferior mesenteric artery, and bilateral iliac, common iliac, and femoral arteries appear patent. No evidence of dissection or significant thrombus.  The gallbladder is distended with suggestion of sludge or possibly layering small stones.  No  wall thickening or inflammatory infiltration is suggested.   The liver, spleen, pancreas, adrenal glands, inferior vena cava, and retroperitoneal lymph nodes are unremarkable.  Sub centimeter parenchymal cysts in both kidneys. No evidence of hydronephrosis or solid mass.  The esophagus, small bowel, and colon are mostly decompressed.  No abnormal bowel distension.  No free air or free fluid in the abdomen.  Pelvis:  The uterus appears atrophic or surgically absent.  No abnormal adnexal masses.  Rectosigmoid colon is stool-filled without evidence of diverticulitis.  The appendix is normal. No free or loculated pelvic fluid collections.  No significant pelvic lymphadenopathy.  Mild degenerative changes in the lumbar spine.  Normal alignment. No destructive bone lesions appreciated.   Review of the MIP images confirms the above findings.  IMPRESSION: No evidence of aneurysm or dissection of the abdominal aorta.  The gallbladder is distended and contains sludge or small stones.   Original Report Authenticated By: Burman Nieves, M.D.   Ct Angio Abd/pel W/ And/or W/o  09/30/2012   *RADIOLOGY REPORT*  Clinical Data:  Chest pain radiating to the back.  Epigastric pain. Nausea and vomiting.  CT ANGIOGRAPHY CHEST, ABDOMEN AND PELVIS  Technique:  Multidetector CT imaging through the chest, abdomen and pelvis was performed using the standard protocol during bolus administration of intravenous contrast.  Multiplanar reconstructed images including MIPs were obtained and reviewed to evaluate the vascular anatomy.  Contrast: OMNIPAQUE IOHEXOL 350 MG/ML SOLN  Comparison:   None.  CTA CHEST  Findings:  Noncontrast CT images of the chest demonstrate no evidence of intramural thrombus in the thoracic aorta.  Suggestion of minimal calcification in the coronary arteries.  Contrast enhanced images demonstrate normal caliber thoracic aorta. No evidence of aneurysm.  Thoracic aorta is patent without evidence of dissection or significant thrombus.  Normal appearance of the great vessels.   Visualized pulmonary arteries appear patent without evidence of central pulmonary embolus.  Normal heart size.  No significant lymphadenopathy in the chest. The esophagus is decompressed.  Thyroid gland appears atrophic.  No pleural effusions.  Visualization of the lungs is limited due to respiratory motion artifact but there appears to be a patchy mosaic pattern suggesting edema or emphysema.  No focal consolidation.  No pneumothorax.  Airways appear patent.  Degenerative changes in the thoracic spine with normal alignment.  No destructive bone lesions are appreciated.   Review of the MIP images confirms the above findings.  IMPRESSION: No evidence of thoracic aortic dissection.  CTA ABDOMEN AND PELVIS  Findings:  Normal caliber abdominal aorta.  No aneurysm.  The abdominal aorta, celiac axis, superior mesenteric artery, bilateral single renal arteries, the inferior mesenteric artery, and bilateral iliac, common iliac, and femoral arteries appear patent. No evidence of dissection or significant thrombus.  The gallbladder is distended with suggestion of sludge or possibly layering small stones.  No wall thickening or inflammatory infiltration is suggested.  The liver, spleen, pancreas, adrenal glands, inferior vena cava, and retroperitoneal lymph nodes are unremarkable.  Sub centimeter parenchymal cysts in both kidneys. No evidence of hydronephrosis or solid mass.  The esophagus, small bowel, and colon are mostly decompressed.  No abnormal bowel distension.  No free air or free fluid in the abdomen.  Pelvis:  The uterus appears atrophic or surgically absent.  No abnormal adnexal masses.  Rectosigmoid colon is stool-filled without evidence of diverticulitis.  The appendix is normal. No free or loculated pelvic fluid collections.  No significant pelvic lymphadenopathy.  Mild degenerative changes in the  lumbar spine.  Normal alignment. No destructive bone lesions appreciated.   Review of the MIP images confirms the  above findings.  IMPRESSION: No evidence of aneurysm or dissection of the abdominal aorta.  The gallbladder is distended and contains sludge or small stones.   Original Report Authenticated By: Burman Nieves, M.D.    EKG: When compared to EKG from 11/18/2011. Sinus bradycardia, mild QT prolongation, some nonspecific ST-T wave changes(inverted T. in V2 which was previously upright).  Time spent: 60 minutes  Drema Dallas [facility] Pager [phone] If 7PM-7AM, please contact night-coverage [email or web address] Password [facility] 09/30/2012, 8:49 AM

## 2012-09-30 NOTE — H&P (Signed)
Sarah UNCAPHER is an 67 y.o. female.   Chief Complaint: Epigastric pain, nausea/ vomiting HPI: 67 yo female with complex PMH of polycythemia vera (Dr. [name]) treated with phlebotomy and Hgb between 5 and 6, fibromyalgia with chronic pain, history of stroke, CAD, HTN, presents with acute onset of epigastric pain radiating through to her back, nausea, vomiting, abdominal distention.  She ate a large meal at a cookout yesterday prior to the onset of her symptoms.  She was brought to the ED, where she was ruled out for any cardiac event.  CTA showed no sign of ischemic events in the chest or abd/pelvis.  She does have significant gallstones with increased CBD diameter (8.2 mm).  Her pain is not well-controlled with IV Dilaudid, although she does take very large narcotic doses at baseline.  Past Medical History  Diagnosis Date  . 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 Fibromyalgia   . Hypothyroidism   . Essential hypertension   . Postmenopausal state     on hormone replacement therapy  . Aortic valve sclerosis     by echocardiogram 12/04/2009  . PAC (premature atrial contraction)     history of  . Coronary artery disease   . Hyperlipidemia   . History of echocardiogram 12/04/2009    showed mild ventricle hypertrophy and normal EF at 55-60% -- mild aortic valve sclerosis  -- trace mitral regurgitation -- slightly pulmonary artery pressure at 47  --  Sarah Clement, M.D.   . Fatigue     History reviewed. No pertinent past surgical history. Hysterectomy   Family History  Problem Relation Age of Onset  . Heart attack Father   . Coronary artery disease Mother     had aortic valve replacement   Social History:  reports that she has never smoked. She does not have any smokeless tobacco history on file. She reports that she does not drink alcohol or use illicit drugs.  Allergies:  Allergies  Allergen Reactions  . Calcium Channel Blockers   . Codeine   . Penicillins   .  Synthroid (Levothyroxine Sodium)     Prior to Admission medications   Medication Sig Start Date End Date Taking? Authorizing Provider  albuterol-ipratropium (COMBIVENT) 18-103 MCG/ACT inhaler Inhale 2 puffs into the lungs every 6 (six) hours as needed for wheezing.   Yes Historical Provider, Hobbs  ALPRAZolam [name]) 0.5 MG tablet Take 1 tablet (0.5 mg total) by mouth every 6 (six) hours as needed. 06/27/12  Yes [name]  aspirin 325 MG tablet Take 325 mg by mouth daily.     Yes Historical Provider, Hobbs  bisoprolol (ZEBETA) 10 MG tablet Take 10 mg by mouth daily.   Yes Historical Provider, Hobbs  cholecalciferol (VITAMIN D) 1000 UNITS tablet Take 3,000 Units by mouth daily.    Yes Historical Provider, Hobbs  clopidogrel (PLAVIX) 75 MG tablet Take 75 mg by mouth daily.   Yes Historical Provider, Hobbs  cyclobenzaprine (FLEXERIL) 10 MG tablet Take 10 mg by mouth 3 (three) times daily as needed for muscle spasms.    Yes Historical Provider, Hobbs  diphenhydrAMINE (BENADRYL) 25 MG tablet Take 50 mg by mouth every 8 (eight) hours as needed.    Yes Historical Provider, Hobbs  DOCUSATE SODIUM PO Take 1 tablet by mouth at bedtime.     Yes Historical Provider, Hobbs  estrogens, conjugated, (PREMARIN) 0.625 MG tablet Take 0.625 mg by mouth 2 (two) times daily.     Yes  Historical Provider, Hobbs  famotidine (PEPCID) 20 MG tablet Take 1 tablet (20 mg total) by mouth 2 (two) times daily. 07/03/12  Yes Sarah Clement, Hobbs  fexofenadine (ALLEGRA) 180 MG tablet Take 180 mg by mouth daily.     Yes Historical Provider, Hobbs  fish oil-omega-3 fatty acids 1000 MG capsule Take 1 g by mouth daily.     Yes Historical Provider, Hobbs  fluoxymesterone (ANDROXY) 10 MG tablet Take 2.5 mg by mouth daily.    Yes Historical Provider, Hobbs  furosemide (LASIX) 40 MG tablet Take 1 tablet (40 mg total) by mouth 2 (two) times daily. 09/07/12  Yes Sarah Clement, Hobbs  Garlic Oil 1000 MG CAPS Take 1 capsule by mouth daily.    Yes Historical Provider, Hobbs   hydrochlorothiazide (MICROZIDE) 12.5 MG capsule Take 1 capsule (12.5 mg total) by mouth daily. 03/08/12  Yes Sarah Clement, Hobbs  lidocaine-prilocaine (EMLA) cream Apply topically as needed. Apply to phlebotomy site at least one hour prior to phlebotomy. 01/20/12  Yes [name]  lisinopril (PRINIVIL,ZESTRIL) 10 MG tablet Take 10 mg by mouth 2 (two) times daily.  06/29/11  Yes Sarah Clement, Hobbs  magnesium oxide (MAG-OX) 400 MG tablet Take 1,200 mg by mouth daily.     Yes Historical Provider, Hobbs  oxyCODONE (OXYCONTIN) 10 MG 12 hr tablet Take 1 tablet (10 mg total) by mouth every 12 (twelve) hours. Brand name medically necessary. 09/27/12  Yes [name]  oxyCODONE (OXYCONTIN) 20 MG 12 hr tablet Take 1 tablet (20 mg total) by mouth every 8 (eight) hours. Brand name medically necessary. 09/27/12  Yes [name]  potassium chloride (K-DUR) 10 MEQ tablet Take 80 mEq by mouth daily.   Yes Historical Provider, Hobbs  promethazine (PHENERGAN) 25 MG tablet Take 25 mg by mouth every 8 (eight) hours as needed for nausea.    Yes Historical Provider, Hobbs  ROXICODONE 15 MG immediate release tablet Take 1 tablet (15 mg total) by mouth every 8 (eight) hours as needed for pain. Brand name medically necessary. 09/27/12 01/10/13 Yes [name]  Sennosides (SENNA LAX PO) Take 1 tablet by mouth at bedtime.     Yes Historical Provider, Hobbs  temazepam (RESTORIL) 30 MG capsule Take 30 mg by mouth at bedtime as needed for sleep.   Yes Historical Provider, Hobbs  thyroid (ARMOUR) 60 MG tablet Take 150 mg by mouth daily before breakfast.   Yes Historical Provider, Hobbs  Valerian 100 MG CAPS Take 1 capsule by mouth daily.    Yes Historical Provider, Hobbs    Results for orders placed during the hospital encounter of 09/30/12 (from the past 48 hour(s))  COMPREHENSIVE METABOLIC PANEL     Status: Abnormal   Collection Time    09/30/12 12:39 AM      Result Value Range   Sodium 135  135 - 145 mEq/L    Potassium 3.3 (*) 3.5 - 5.1 mEq/L   Chloride 95 (*) 96 - 112 mEq/L   CO2 31  19 - 32 mEq/L   Glucose, Bld 123 (*) 70 - 99 mg/dL   BUN 9  6 - 23 mg/dL   Creatinine, Ser 9.56  0.50 - 1.10 mg/dL   Calcium 8.9  8.4 - 21.3 mg/dL   Total Protein 6.6  6.0 - 8.3 g/dL   Albumin 3.5  3.5 - 5.2 g/dL   AST 15  0 - 37 U/L   ALT 6  0 -  35 U/L   Alkaline Phosphatase 52  39 - 117 U/L   Total Bilirubin 0.3  0.3 - 1.2 mg/dL   GFR calc non Af Amer 71 (*) >90 mL/min   GFR calc Af Amer 83 (*) >90 mL/min   Comment:            The eGFR has been calculated     using the CKD EPI equation.     This calculation has not been     validated in all clinical     situations.     eGFR's persistently     <90 mL/min signify     possible Chronic Kidney Disease.  LIPASE, BLOOD     Status: None   Collection Time    09/30/12 12:39 AM      Result Value Range   Lipase 23  11 - 59 U/L  POCT I-STAT TROPONIN I     Status: None   Collection Time    09/30/12 12:57 AM      Result Value Range   Troponin i, poc 0.02  0.00 - 0.08 ng/mL   Comment 3            Comment: Due to the release kinetics of cTnI,     a negative result within the first hours     of the onset of symptoms does not rule out     myocardial infarction with certainty.     If myocardial infarction is still suspected,     repeat the test at appropriate intervals.  POCT I-STAT, CHEM 8     Status: Abnormal   Collection Time    09/30/12  1:15 AM      Result Value Range   Sodium 137  135 - 145 mEq/L   Potassium 3.4 (*) 3.5 - 5.1 mEq/L   Chloride 94 (*) 96 - 112 mEq/L   BUN 9  6 - 23 mg/dL   Creatinine, Ser 9.60  0.50 - 1.10 mg/dL   Glucose, Bld 454 (*) 70 - 99 mg/dL   Calcium, Ion 0.98 (*) 1.13 - 1.30 mmol/L   TCO2 32  0 - 100 mmol/L   Hemoglobin 8.2 (*) 12.0 - 15.0 g/dL   HCT 11.9 (*) 14.7 - 82.9 %  CBC WITH DIFFERENTIAL     Status: Abnormal   Collection Time    09/30/12  5:35 AM      Result Value Range   WBC 7.4  4.0 - 10.5 K/uL   RBC 3.75 (*)  3.87 - 5.11 MIL/uL   Hemoglobin 5.7 (*) 12.0 - 15.0 g/dL   Comment: DELTA CHECK NOTED     CRITICAL RESULT CALLED TO, READ BACK BY AND VERIFIED WITH:      TO MBROWN(RN) BY TEC 09/30/12 AT 0604   HCT 23.1 (*) 36.0 - 46.0 %   MCV 61.6 (*) 78.0 - 100.0 fL   MCH 15.2 (*) 26.0 - 34.0 pg   MCHC 24.7 (*) 30.0 - 36.0 g/dL   RDW 56.2 (*) 13.0 - 86.5 %   Platelets 202  150 - 400 K/uL   Comment: PLATELET COUNT CONFIRMED BY SMEAR   Neutrophils Relative % 90 (*) 43 - 77 %   Neutro Abs 6.6  1.7 - 7.7 K/uL   Lymphocytes Relative 5 (*) 12 - 46 %   Lymphs Abs 0.4 (*) 0.7 - 4.0 K/uL   Monocytes Relative 5  3 - 12 %   Monocytes Absolute 0.3  0.1 - 1.0 K/uL  Eosinophils Relative 0  0 - 5 %   Eosinophils Absolute 0.0  0.0 - 0.7 K/uL   Basophils Relative 0  0 - 1 %   Basophils Absolute 0.0  0.0 - 0.1 K/uL   US Abdomen Complete  09/30/2012   *RADIOLOGY REPORT*  Clinical Data:  Mid epigastric pain.   ABDOMINAL ULTRASOUND COMPLETE  Comparison:  None.  Findings:  Gallbladder:  Multiple stones are identified within the gallbladder measuring up to 8 mm.  No gallbladder wall thickening or pericholecystic fluid.  Negative sonographic Murphy's sign.  Common Bile Duct:  The common bile duct is increased in caliber measuring up to 8.2 mm.  Liver: No focal mass lesion identified.  Within normal limits in parenchymal echogenicity.  IVC:  Appears normal.  Pancreas:  No abnormality identified.  Spleen:  Within normal limits in size and echotexture.  Right kidney:  Normal in size and parenchymal echogenicity.  No evidence of mass or hydronephrosis.  Left kidney:  Normal in size and parenchymal echogenicity.  No evidence of mass or hydronephrosis.  Abdominal Aorta:  No aneurysm identified.  IMPRESSION:  1.  Gallstones. 2.  Increased caliber of the common bile duct which measures up to 8.2 mm. If there is clinical concern for choledocholithiasis an MRCP may be helpful.   Original Report Authenticated By: Signa Kell, M.D.   Dg  Chest Port 1 View  09/30/2012   *RADIOLOGY REPORT*  Clinical Data: Mid sternal chest pain since 06:00 p.m.  PORTABLE CHEST - 1 VIEW  Comparison: 04/01/2008  Findings: The heart size and pulmonary vascularity are normal. The lungs appear clear and expanded without focal air space disease or consolidation. No blunting of the costophrenic angles.  No pneumothorax.  Mediastinal contours appear intact.  No significant change since previous study.  IMPRESSION: No evidence of active pulmonary disease.   Original Report Authenticated By: Burman Nieves, M.D.   Ct Angio Chest Aorta W/cm &/or Wo/cm  09/30/2012   *RADIOLOGY REPORT*  Clinical Data:  Chest pain radiating to the back.  Epigastric pain. Nausea and vomiting.  CT ANGIOGRAPHY CHEST, ABDOMEN AND PELVIS  Technique:  Multidetector CT imaging through the chest, abdomen and pelvis was performed using the standard protocol during bolus administration of intravenous contrast.  Multiplanar reconstructed images including MIPs were obtained and reviewed to evaluate the vascular anatomy.  Contrast: OMNIPAQUE IOHEXOL 350 MG/ML SOLN  Comparison:   None.  CTA CHEST  Findings:  Noncontrast CT images of the chest demonstrate no evidence of intramural thrombus in the thoracic aorta.  Suggestion of minimal calcification in the coronary arteries.  Contrast enhanced images demonstrate normal caliber thoracic aorta. No evidence of aneurysm.  Thoracic aorta is patent without evidence of dissection or significant thrombus.  Normal appearance of the great vessels.  Visualized pulmonary arteries appear patent without evidence of central pulmonary embolus.  Normal heart size.  No significant lymphadenopathy in the chest. The esophagus is decompressed.  Thyroid gland appears atrophic.  No pleural effusions.  Visualization of the lungs is limited due to respiratory motion artifact but there appears to be a patchy mosaic pattern suggesting edema or emphysema.  No focal consolidation.   No pneumothorax.  Airways appear patent.  Degenerative changes in the thoracic spine with normal alignment.  No destructive bone lesions are appreciated.   Review of the MIP images confirms the above findings.  IMPRESSION: No evidence of thoracic aortic dissection.  CTA ABDOMEN AND PELVIS  Findings:  Normal caliber abdominal aorta.  No aneurysm.  The abdominal aorta, celiac axis, superior mesenteric artery, bilateral single renal arteries, the inferior mesenteric artery, and bilateral iliac, common iliac, and femoral arteries appear patent. No evidence of dissection or significant thrombus.  The gallbladder is distended with suggestion of sludge or possibly layering small stones.  No wall thickening or inflammatory infiltration is suggested.  The liver, spleen, pancreas, adrenal glands, inferior vena cava, and retroperitoneal lymph nodes are unremarkable.  Sub centimeter parenchymal cysts in both kidneys. No evidence of hydronephrosis or solid mass.  The esophagus, small bowel, and colon are mostly decompressed.  No abnormal bowel distension.  No free air or free fluid in the abdomen.  Pelvis:  The uterus appears atrophic or surgically absent.  No abnormal adnexal masses.  Rectosigmoid colon is stool-filled without evidence of diverticulitis.  The appendix is normal. No free or loculated pelvic fluid collections.  No significant pelvic lymphadenopathy.  Mild degenerative changes in the lumbar spine.  Normal alignment. No destructive bone lesions appreciated.   Review of the MIP images confirms the above findings.  IMPRESSION: No evidence of aneurysm or dissection of the abdominal aorta.  The gallbladder is distended and contains sludge or small stones.   Original Report Authenticated By: Burman Nieves, M.D.   Ct Angio Abd/pel W/ And/or W/o  09/30/2012   *RADIOLOGY REPORT*  Clinical Data:  Chest pain radiating to the back.  Epigastric pain. Nausea and vomiting.  CT ANGIOGRAPHY CHEST, ABDOMEN AND PELVIS   Technique:  Multidetector CT imaging through the chest, abdomen and pelvis was performed using the standard protocol during bolus administration of intravenous contrast.  Multiplanar reconstructed images including MIPs were obtained and reviewed to evaluate the vascular anatomy.  Contrast: OMNIPAQUE IOHEXOL 350 MG/ML SOLN  Comparison:   None.  CTA CHEST  Findings:  Noncontrast CT images of the chest demonstrate no evidence of intramural thrombus in the thoracic aorta.  Suggestion of minimal calcification in the coronary arteries.  Contrast enhanced images demonstrate normal caliber thoracic aorta. No evidence of aneurysm.  Thoracic aorta is patent without evidence of dissection or significant thrombus.  Normal appearance of the great vessels.  Visualized pulmonary arteries appear patent without evidence of central pulmonary embolus.  Normal heart size.  No significant lymphadenopathy in the chest. The esophagus is decompressed.  Thyroid gland appears atrophic.  No pleural effusions.  Visualization of the lungs is limited due to respiratory motion artifact but there appears to be a patchy mosaic pattern suggesting edema or emphysema.  No focal consolidation.  No pneumothorax.  Airways appear patent.  Degenerative changes in the thoracic spine with normal alignment.  No destructive bone lesions are appreciated.   Review of the MIP images confirms the above findings.  IMPRESSION: No evidence of thoracic aortic dissection.  CTA ABDOMEN AND PELVIS  Findings:  Normal caliber abdominal aorta.  No aneurysm.  The abdominal aorta, celiac axis, superior mesenteric artery, bilateral single renal arteries, the inferior mesenteric artery, and bilateral iliac, common iliac, and femoral arteries appear patent. No evidence of dissection or significant thrombus.  The gallbladder is distended with suggestion of sludge or possibly layering small stones.  No wall thickening or inflammatory infiltration is suggested.  The liver,  spleen, pancreas, adrenal glands, inferior vena cava, and retroperitoneal lymph nodes are unremarkable.  Sub centimeter parenchymal cysts in both kidneys. No evidence of hydronephrosis or solid mass.  The esophagus, small bowel, and colon are mostly decompressed.  No abnormal bowel distension.  No free air or  free fluid in the abdomen.  Pelvis:  The uterus appears atrophic or surgically absent.  No abnormal adnexal masses.  Rectosigmoid colon is stool-filled without evidence of diverticulitis.  The appendix is normal. No free or loculated pelvic fluid collections.  No significant pelvic lymphadenopathy.  Mild degenerative changes in the lumbar spine.  Normal alignment. No destructive bone lesions appreciated.   Review of the MIP images confirms the above findings.  IMPRESSION: No evidence of aneurysm or dissection of the abdominal aorta.  The gallbladder is distended and contains sludge or small stones.   Original Report Authenticated By: Burman Nieves, M.D.    Review of Systems  Eyes: Negative.   Cardiovascular: Positive for chest pain.  Gastrointestinal: Positive for nausea, vomiting and abdominal pain.  Musculoskeletal: Positive for myalgias and back pain.  Neurological: Negative.   Endo/Heme/Allergies: Negative.     Blood pressure 154/63, pulse 76, temperature 97.5 F (36.4 C), temperature source Oral, resp. rate 18, SpO2 93.00%. Physical Exam  WDWN in NAD HEENT:  EOMI, sclera anicteric Neck:  No masses, no thyromegaly Lungs:  CTA bilaterally; normal respiratory effort CV:  Regular rate and rhythm; no murmurs Abd:  +bowel sounds, mildly distended; tender in epigastrium and RUQ Ext:  Well-perfused; no edema Skin:  Warm, dry; no sign of jaundice  Assessment/Plan Symptomatic cholelithiasis/ possible early cholecystitis Polycythemia with iatrogenic anemia Fibromyalgia with chronic pain Hypertension History of CAD/ CVA  Plan: Hematology and Medicine have been consulted to help with  her management Leave her Hgb where it is for now. Will hold aspirin - use Lovenox, but will hold prior to surgery Admit for IV hydration/ IV abx/ pain control Probable lap chole with IOC this admission (?Monday)     Diamantina Edinger K. 09/30/2012, 8:25 AM

## 2012-10-01 ENCOUNTER — Encounter (HOSPITAL_COMMUNITY): Admission: EM | Disposition: A | Discharge status: Home / Self Care | Payer: Self-pay | Source: Home / Self Care

## 2012-10-01 ENCOUNTER — Inpatient Hospital Stay (HOSPITAL_COMMUNITY): Payer: Medicare Other

## 2012-10-01 DIAGNOSIS — D72829 Elevated white blood cell count, unspecified: Secondary | ICD-10-CM

## 2012-10-01 DIAGNOSIS — I2510 Atherosclerotic heart disease of native coronary artery without angina pectoris: Secondary | ICD-10-CM

## 2012-10-01 LAB — COMPREHENSIVE METABOLIC PANEL
ALT: 5 U/L (ref 0–35)
AST: 11 U/L (ref 0–37)
Albumin: 3 g/dL — ABNORMAL LOW (ref 3.5–5.2)
CO2: 30 mEq/L (ref 19–32)
Calcium: 8.6 mg/dL (ref 8.4–10.5)
Chloride: 98 mEq/L (ref 96–112)
Creatinine, Ser: 0.76 mg/dL (ref 0.50–1.10)
GFR calc non Af Amer: 84 mL/min — ABNORMAL LOW (ref >90)
Sodium: 134 mEq/L — ABNORMAL LOW (ref 135–145)

## 2012-10-01 LAB — CBC WITH DIFFERENTIAL/PLATELET
Basophils Relative: 0 % (ref 0–1)
Eosinophils Absolute: 0 10*3/uL (ref 0.0–0.7)
Eosinophils Relative: 0 % (ref 0–5)
Hemoglobin: 5.5 g/dL — CL (ref 12.0–15.0)
Lymphs Abs: 0.6 10*3/uL — ABNORMAL LOW (ref 0.7–4.0)
MCH: 15.1 pg — ABNORMAL LOW (ref 26.0–34.0)
MCHC: 24.6 g/dL — ABNORMAL LOW (ref 30.0–36.0)
MCV: 61.4 fL — ABNORMAL LOW (ref 78.0–100.0)
Monocytes Absolute: 2.5 10*3/uL — ABNORMAL HIGH (ref 0.1–1.0)
Monocytes Relative: 11 % (ref 3–12)
RBC: 3.65 MIL/uL — ABNORMAL LOW (ref 3.87–5.11)

## 2012-10-01 LAB — URINALYSIS, ROUTINE W REFLEX MICROSCOPIC
Glucose, UA: NEGATIVE mg/dL
Protein, ur: 30 mg/dL — AB
Specific Gravity, Urine: 1.018 (ref 1.005–1.030)
pH: 5.5 (ref 5.0–8.0)

## 2012-10-01 LAB — URINE MICROSCOPIC-ADD ON

## 2012-10-01 SURGERY — LAPAROSCOPIC CHOLECYSTECTOMY WITH INTRAOPERATIVE CHOLANGIOGRAM
Anesthesia: General | Site: Abdomen

## 2012-10-01 MED ORDER — TECHNETIUM TC 99M MEBROFENIN IV KIT
5.0000 | PACK | Freq: Once | INTRAVENOUS | Status: AC | PRN
  Administered 2012-10-01: 5 via INTRAVENOUS

## 2012-10-01 MED ORDER — ACETAMINOPHEN 325 MG PO TABS
650.0000 mg | ORAL_TABLET | ORAL | Status: DC | PRN
  Administered 2012-10-01 – 2012-10-12 (×9): 650 mg via ORAL
  Filled 2012-10-01: qty 2
  Filled 2012-10-01: qty 1
  Filled 2012-10-01 (×8): qty 2

## 2012-10-01 MED ORDER — POTASSIUM CHLORIDE 20 MEQ/15ML (10%) PO LIQD
40.0000 meq | Freq: Once | ORAL | Status: AC
  Administered 2012-10-01: 40 meq via ORAL
  Filled 2012-10-01: qty 30

## 2012-10-01 MED ORDER — OXYCODONE HCL ER 10 MG PO T12A
20.0000 mg | EXTENDED_RELEASE_TABLET | ORAL | Status: DC
  Administered 2012-10-02 – 2012-10-04 (×6): 20 mg via ORAL
  Filled 2012-10-01: qty 1
  Filled 2012-10-01 (×2): qty 2
  Filled 2012-10-01: qty 1
  Filled 2012-10-01: qty 2
  Filled 2012-10-01: qty 1
  Filled 2012-10-01: qty 2
  Filled 2012-10-01: qty 1

## 2012-10-01 MED ORDER — METOPROLOL TARTRATE 1 MG/ML IV SOLN: 5.0000 mg | INTRAVENOUS | Status: DC | PRN

## 2012-10-01 MED ORDER — MORPHINE SULFATE 4 MG/ML IJ SOLN
3.0000 mg | Freq: Once | INTRAMUSCULAR | Status: AC
  Administered 2012-10-01: 3 mg via INTRAVENOUS

## 2012-10-01 MED ORDER — METRONIDAZOLE IN NACL 5-0.79 MG/ML-% IV SOLN
500.0000 mg | Freq: Three times a day (TID) | INTRAVENOUS | Status: DC
  Administered 2012-10-01 – 2012-10-03 (×7): 500 mg via INTRAVENOUS
  Filled 2012-10-01 (×8): qty 100

## 2012-10-01 MED ORDER — MORPHINE SULFATE 4 MG/ML IJ SOLN
INTRAMUSCULAR | Status: AC
  Filled 2012-10-01: qty 1

## 2012-10-01 MED ORDER — KETOROLAC TROMETHAMINE 30 MG/ML IJ SOLN: 30.0000 mg | Freq: Once | INTRAMUSCULAR | Status: AC

## 2012-10-01 MED ORDER — DOCUSATE SODIUM 100 MG PO CAPS
100.0000 mg | ORAL_CAPSULE | Freq: Every day | ORAL | Status: DC
  Administered 2012-10-03 – 2012-10-12 (×9): 100 mg via ORAL
  Filled 2012-10-01 (×10): qty 1

## 2012-10-01 MED ORDER — OXYCODONE HCL ER 10 MG PO T12A
10.0000 mg | EXTENDED_RELEASE_TABLET | ORAL | Status: DC
  Administered 2012-10-01 – 2012-10-04 (×6): 10 mg via ORAL
  Filled 2012-10-01 (×5): qty 1

## 2012-10-01 MED ORDER — SENNA 8.6 MG PO TABS
1.0000 | ORAL_TABLET | Freq: Every day | ORAL | Status: DC
  Administered 2012-10-01 – 2012-10-05 (×5): 8.6 mg via ORAL
  Filled 2012-10-01 (×7): qty 1

## 2012-10-01 NOTE — Progress Notes (Signed)
The patient states that her front two top teeth are very fragile and can break easily.  She will need a tooth guard for intubation.  Please pass this on to anesthesia staff.  Roland Rack, RN

## 2012-10-01 NOTE — Progress Notes (Signed)
Pt claimed to have a drug allergy to morphine (itching but no SOB).  Morphine was ordered and given at 1410, pt was closely monitored, no untoward reaction noted so far at this time.  Pt. more comfortable and proceeded with HIDA scan.

## 2012-10-01 NOTE — Progress Notes (Signed)
[name] Progress Note    Subjective: Spoke with the patient and her husband in detail about her symptoms and her current problems.  She continues to be diffusely tender in her abdomen, including the LLQ and the RLQ.  Tenderness is not specific to the RUQ.  Her WBC is 21K on antibiotics, and her ultrasound had not wall thickening, no sonographic Murphy's sign, no pericholecystic fluid--no signs of acute inflammation.    Objective: Vital signs in last 24 hours: Temp:  [98.4 F (36.9 C)-102.4 F (39.1 C)] 101 F (38.3 C) (07/06 0648) Pulse Rate:  [70-101] 101 (07/06 0524) Resp:  [13-20] 19 (07/06 0524) BP: (126-155)/(48-96) 148/58 mmHg (07/06 0524) SpO2:  [91 %-100 %] 95 % (07/06 0524) Last BM Date: 09/28/12  Intake/Output from previous day: 07/05 0701 - 07/06 0700 In: 1723.3 [P.O.:100; I.V.:1623.3] Out: 300 [Urine:300] Intake/Output this shift:    General: No acute distress.  Very pale.  Short of breath with speaking.  Lungs: Clear  Abd: Diffusely tender without peritonitis.  Seems to have heightened response to pain.  Extremities: No current signs of DVT  Neuro: Intact  Lab Results:  @LABLAST2 (wbc:2,hgb:2,hct:2,plt:2) BMET  Recent Labs  09/30/12 0039 09/30/12 0115 09/30/12 1220 10/01/12 0500  NA 135 137  --  134*  K 3.3* 3.4*  --  3.3*  CL 95* 94*  --  98  CO2 31  --   --  30  GLUCOSE 123* 125*  --  123*  BUN 9 9  --  8  CREATININE 0.82 1.00 0.76 0.76  CALCIUM 8.9  --   --  8.6   PT/INR No results found for this basename: LABPROT, INR,  in the last 72 hours ABG No results found for this basename: PHART, PCO2, PO2, HCO3,  in the last 72 hours  Studies/Results: US Abdomen Complete  09/30/2012   *RADIOLOGY REPORT*  Clinical Data:  Mid epigastric pain.   ABDOMINAL ULTRASOUND COMPLETE  Comparison:  None.  Findings:  Gallbladder:  Multiple stones are identified within the gallbladder measuring up to 8 mm.  No gallbladder wall thickening or pericholecystic fluid.   Negative sonographic Murphy's sign.  Common Bile Duct:  The common bile duct is increased in caliber measuring up to 8.2 mm.  Liver: No focal mass lesion identified.  Within normal limits in parenchymal echogenicity.  IVC:  Appears normal.  Pancreas:  No abnormality identified.  Spleen:  Within normal limits in size and echotexture.  Right kidney:  Normal in size and parenchymal echogenicity.  No evidence of mass or hydronephrosis.  Left kidney:  Normal in size and parenchymal echogenicity.  No evidence of mass or hydronephrosis.  Abdominal Aorta:  No aneurysm identified.  IMPRESSION:  1.  Gallstones. 2.  Increased caliber of the common bile duct which measures up to 8.2 mm. If there is clinical concern for choledocholithiasis an MRCP may be helpful.   Original Report Authenticated By: Signa Kell, M.D.   Dg Chest Port 1 View  09/30/2012   *RADIOLOGY REPORT*  Clinical Data: Mid sternal chest pain since 06:00 p.m.  PORTABLE CHEST - 1 VIEW  Comparison: 04/01/2008  Findings: The heart size and pulmonary vascularity are normal. The lungs appear clear and expanded without focal air space disease or consolidation. No blunting of the costophrenic angles.  No pneumothorax.  Mediastinal contours appear intact.  No significant change since previous study.  IMPRESSION: No evidence of active pulmonary disease.   Original Report Authenticated By: Burman Nieves, M.D.   Ct  Angio Chest Aorta W/cm &/or Wo/cm  09/30/2012   *RADIOLOGY REPORT*  Clinical Data:  Chest pain radiating to the back.  Epigastric pain. Nausea and vomiting.  CT ANGIOGRAPHY CHEST, ABDOMEN AND PELVIS  Technique:  Multidetector CT imaging through the chest, abdomen and pelvis was performed using the standard protocol during bolus administration of intravenous contrast.  Multiplanar reconstructed images including MIPs were obtained and reviewed to evaluate the vascular anatomy.  Contrast: OMNIPAQUE IOHEXOL 350 MG/ML SOLN  Comparison:   None.  CTA  CHEST  Findings:  Noncontrast CT images of the chest demonstrate no evidence of intramural thrombus in the thoracic aorta.  Suggestion of minimal calcification in the coronary arteries.  Contrast enhanced images demonstrate normal caliber thoracic aorta. No evidence of aneurysm.  Thoracic aorta is patent without evidence of dissection or significant thrombus.  Normal appearance of the great vessels.  Visualized pulmonary arteries appear patent without evidence of central pulmonary embolus.  Normal heart size.  No significant lymphadenopathy in the chest. The esophagus is decompressed.  Thyroid gland appears atrophic.  No pleural effusions.  Visualization of the lungs is limited due to respiratory motion artifact but there appears to be a patchy mosaic pattern suggesting edema or emphysema.  No focal consolidation.  No pneumothorax.  Airways appear patent.  Degenerative changes in the thoracic spine with normal alignment.  No destructive bone lesions are appreciated.   Review of the MIP images confirms the above findings.  IMPRESSION: No evidence of thoracic aortic dissection.  CTA ABDOMEN AND PELVIS  Findings:  Normal caliber abdominal aorta.  No aneurysm.  The abdominal aorta, celiac axis, superior mesenteric artery, bilateral single renal arteries, the inferior mesenteric artery, and bilateral iliac, common iliac, and femoral arteries appear patent. No evidence of dissection or significant thrombus.  The gallbladder is distended with suggestion of sludge or possibly layering small stones.  No wall thickening or inflammatory infiltration is suggested.  The liver, spleen, pancreas, adrenal glands, inferior vena cava, and retroperitoneal lymph nodes are unremarkable.  Sub centimeter parenchymal cysts in both kidneys. No evidence of hydronephrosis or solid mass.  The esophagus, small bowel, and colon are mostly decompressed.  No abnormal bowel distension.  No free air or free fluid in the abdomen.  Pelvis:  The uterus  appears atrophic or surgically absent.  No abnormal adnexal masses.  Rectosigmoid colon is stool-filled without evidence of diverticulitis.  The appendix is normal. No free or loculated pelvic fluid collections.  No significant pelvic lymphadenopathy.  Mild degenerative changes in the lumbar spine.  Normal alignment. No destructive bone lesions appreciated.   Review of the MIP images confirms the above findings.  IMPRESSION: No evidence of aneurysm or dissection of the abdominal aorta.  The gallbladder is distended and contains sludge or small stones.   Original Report Authenticated By: Burman Nieves, M.D.   Ct Angio Abd/pel W/ And/or W/o  09/30/2012   *RADIOLOGY REPORT*  Clinical Data:  Chest pain radiating to the back.  Epigastric pain. Nausea and vomiting.  CT ANGIOGRAPHY CHEST, ABDOMEN AND PELVIS  Technique:  Multidetector CT imaging through the chest, abdomen and pelvis was performed using the standard protocol during bolus administration of intravenous contrast.  Multiplanar reconstructed images including MIPs were obtained and reviewed to evaluate the vascular anatomy.  Contrast: OMNIPAQUE IOHEXOL 350 MG/ML SOLN  Comparison:   None.  CTA CHEST  Findings:  Noncontrast CT images of the chest demonstrate no evidence of intramural thrombus in the thoracic aorta.  Suggestion of minimal calcification in the coronary arteries.  Contrast enhanced images demonstrate normal caliber thoracic aorta. No evidence of aneurysm.  Thoracic aorta is patent without evidence of dissection or significant thrombus.  Normal appearance of the great vessels.  Visualized pulmonary arteries appear patent without evidence of central pulmonary embolus.  Normal heart size.  No significant lymphadenopathy in the chest. The esophagus is decompressed.  Thyroid gland appears atrophic.  No pleural effusions.  Visualization of the lungs is limited due to respiratory motion artifact but there appears to be a patchy mosaic pattern  suggesting edema or emphysema.  No focal consolidation.  No pneumothorax.  Airways appear patent.  Degenerative changes in the thoracic spine with normal alignment.  No destructive bone lesions are appreciated.   Review of the MIP images confirms the above findings.  IMPRESSION: No evidence of thoracic aortic dissection.  CTA ABDOMEN AND PELVIS  Findings:  Normal caliber abdominal aorta.  No aneurysm.  The abdominal aorta, celiac axis, superior mesenteric artery, bilateral single renal arteries, the inferior mesenteric artery, and bilateral iliac, common iliac, and femoral arteries appear patent. No evidence of dissection or significant thrombus.  The gallbladder is distended with suggestion of sludge or possibly layering small stones.  No wall thickening or inflammatory infiltration is suggested.  The liver, spleen, pancreas, adrenal glands, inferior vena cava, and retroperitoneal lymph nodes are unremarkable.  Sub centimeter parenchymal cysts in both kidneys. No evidence of hydronephrosis or solid mass.  The esophagus, small bowel, and colon are mostly decompressed.  No abnormal bowel distension.  No free air or free fluid in the abdomen.  Pelvis:  The uterus appears atrophic or surgically absent.  No abnormal adnexal masses.  Rectosigmoid colon is stool-filled without evidence of diverticulitis.  The appendix is normal. No free or loculated pelvic fluid collections.  No significant pelvic lymphadenopathy.  Mild degenerative changes in the lumbar spine.  Normal alignment. No destructive bone lesions appreciated.   Review of the MIP images confirms the above findings.  IMPRESSION: No evidence of aneurysm or dissection of the abdominal aorta.  The gallbladder is distended and contains sludge or small stones.   Original Report Authenticated By: Burman Nieves, M.D.    Anti-infectives: Anti-infectives   Start     Dose/Rate Route Frequency Ordered Stop   10/01/12 0815  metroNIDAZOLE (FLAGYL) IVPB 500 mg     500  mg 100 mL/hr over 60 Minutes Intravenous Every 8 hours 10/01/12 0810     09/30/12 1200  ciprofloxacin (CIPRO) IVPB 400 mg     400 mg 200 mL/hr over 60 Minutes Intravenous Every 12 hours 09/30/12 1057        Assessment/Plan: s/p Procedure(s): LAPAROSCOPIC CHOLECYSTECTOMY WITH INTRAOPERATIVE CHOLANGIOGRAM Have cancelled surgery for today. Needs further workup for source of leukocytosis. Also, the patient has been on Plavix and ASA--would prefer for those to be off longer unless she was thought to be septic from her GB, which I do not think is the case.  Lastly, her hemoglobin is in her "normal" range, but still very low if we are considering surgery with possible blood loss.  LOS: 1 day   Marta Lamas. Gae Bon, MD, FACS [phone] [facility] 10/01/2012

## 2012-10-01 NOTE — Progress Notes (Addendum)
[facility]                                                                                Patient Demographics  [name], is a 67 y.o. female, DOB - [birth year], WGN:[id number], HQI:[id number]  Admit date - 09/30/2012  Admitting Physician Md Montez Morita, MD  Outpatient Primary MD for the patient is [name], MD  LOS - 1   Chief Complaint  Patient presents with  . Chest Pain        Assessment & Plan    1. Cholelithiases; Surgery per Dr Corliss Skains ( Surgeon) - she is currently NPO, on cipro, have added flagyl, plan per [facility].   2. HTN; controlled on current meds, IV lopressor PRN added.   3. PCV; per Haematology will leave Hg at current level , if transfusion desired please contact haem -onc          4. CAD; per Pt's Cardiologist note fm 7/2/2012She does not have a history of known ischemic heart disease. She had an echocardiogram in 12/04/09 which showed normal left ventricular systolic function with an ejection fraction of 55-60% and normal diastolic function and mild aortic sclerosis. Trop -ve x 3, pain free now.           5. Hypothyroidism; per patient and husband unable to take Synthroid. On Armour Thyroid 120 mg daily (patient has home meds) , stable , outpt PCP followup post DC.                   6. Fibromyalgia; on Home med regimen , rsume once taking PO.           7.Bowel Regimen; Pt and Husband very concerned she is not on Home regimen. If Pt's abd pain becomes controlled would recommend starting Docusate Sodium 50 mg TID, and Senna 15 mg BID in step wise fashoin, continue once taking PO.           8.Low K - replace PO (in PM) & IV.    DVT Prophylaxis  Lovenox    Lab Results  Component Value Date   PLT 216 10/01/2012    Medications  Scheduled Meds: . bisoprolol  10 mg Oral Daily  . ciprofloxacin  400 mg Intravenous Q12H  . enoxaparin (LOVENOX) injection  40 mg Subcutaneous Q24H  . estrogens (conjugated)  0.625 mg Oral BID  . hydrochlorothiazide   12.5 mg Oral Daily  . lisinopril  10 mg Oral BID  . metronidazole  500 mg Intravenous Q8H  . OxyCODONE  10 mg Oral Q12H  . OxyCODONE  20 mg Oral Q8H  . pantoprazole (PROTONIX) IV  40 mg Intravenous QHS  . potassium chloride  40 mEq Oral Once  . thyroid  150 mg Oral QAC breakfast   Continuous Infusions: . dextrose 5 % and 0.45 % NaCl with KCl 20 mEq/L 100 mL/hr at 09/30/12 1246   PRN Meds:.acetaminophen, albuterol-ipratropium, ALPRAZolam, cyclobenzaprine, diphenhydrAMINE, diphenhydrAMINE, HYDROmorphone (DILAUDID) injection, ondansetron, promethazine, temazepam  Antibiotics    Anti-infectives   Start     Dose/Rate Route Frequency Ordered Stop   10/01/12 0815  metroNIDAZOLE (FLAGYL) IVPB 500 mg     500 mg 100 mL/hr  over 60 Minutes Intravenous Every 8 hours 10/01/12 0810     09/30/12 1200  ciprofloxacin (CIPRO) IVPB 400 mg     400 mg 200 mL/hr over 60 Minutes Intravenous Every 12 hours 09/30/12 1057         Time Spent in minutes  35   [name] K M.D on 10/01/2012 at 8:28 AM  Between 7am to 7pm - Pager - [phone]  After 7pm go to [email or web address] - password [email or web address]  And look for the night coverage person covering for me after hours  [facility] Office  [phone]    Subjective:   [name] today has, No headache, No chest pain, +ve abdominal pain - No Nausea, No new weakness tingling or numbness, No Cough - SOB.    Objective:   Filed Vitals:   09/30/12 2108 10/01/12 0233 10/01/12 0524 10/01/12 0648  BP: 131/48 128/51 148/58   Pulse: 96 93 101   Temp: 100.3 F (37.9 C) 100.2 F (37.9 C) 102.4 F (39.1 C) 101 F (38.3 C)  TempSrc: Oral Oral    Resp: 18 18 19    SpO2: 96% 94% 95%     Wt Readings from Last 3 Encounters:  08/16/12 74.844 kg (165 lb)  06/27/12 74.844 kg (165 lb)  04/26/12 78.019 kg (172 lb)     Intake/Output Summary (Last 24 hours) at 10/01/12 0828 Last data filed at 10/01/12 0500  Gross per 24 hour  Intake 1723.33 ml   Output    300 ml  Net 1423.33 ml    Exam Awake Alert, Oriented X 3, No new F.N deficits, Normal affect [place].AT,PERRAL Supple Neck,No JVD, No cervical lymphadenopathy appriciated.  Symmetrical Chest wall movement, Good air movement bilaterally, CTAB RRR,No Gallops,Rubs or new Murmurs, No Parasternal Heave +ve B.Sounds, Abd Soft, +ve RUQ tenderness , No organomegaly appriciated, No rebound - guarding or rigidity. No Cyanosis, Clubbing or edema, No new Rash or bruise      Data Review   Micro Results No results found for this or any previous visit (from the past 240 hour(s)).  Radiology Reports US Abdomen Complete  09/30/2012   *RADIOLOGY REPORT*  Clinical Data:  Mid epigastric pain.   ABDOMINAL ULTRASOUND COMPLETE  Comparison:  None.  Findings:  Gallbladder:  Multiple stones are identified within the gallbladder measuring up to 8 mm.  No gallbladder wall thickening or pericholecystic fluid.  Negative sonographic Murphy's sign.  Common Bile Duct:  The common bile duct is increased in caliber measuring up to 8.2 mm.  Liver: No focal mass lesion identified.  Within normal limits in parenchymal echogenicity.  IVC:  Appears normal.  Pancreas:  No abnormality identified.  Spleen:  Within normal limits in size and echotexture.  Right kidney:  Normal in size and parenchymal echogenicity.  No evidence of mass or hydronephrosis.  Left kidney:  Normal in size and parenchymal echogenicity.  No evidence of mass or hydronephrosis.  Abdominal Aorta:  No aneurysm identified.  IMPRESSION:  1.  Gallstones. 2.  Increased caliber of the common bile duct which measures up to 8.2 mm. If there is clinical concern for choledocholithiasis an MRCP may be helpful.   Original Report Authenticated By: Signa Kell, M.D.   Dg Chest Port 1 View  09/30/2012   *RADIOLOGY REPORT*  Clinical Data: Mid sternal chest pain since 06:00 p.m.  PORTABLE CHEST - 1 VIEW  Comparison: 04/01/2008  Findings: The heart size and pulmonary  vascularity are normal. The lungs appear clear and expanded without  focal air space disease or consolidation. No blunting of the costophrenic angles.  No pneumothorax.  Mediastinal contours appear intact.  No significant change since previous study.  IMPRESSION: No evidence of active pulmonary disease.   Original Report Authenticated By: Burman Nieves, M.D.   Ct Angio Chest Aorta W/cm &/or Wo/cm  09/30/2012   *RADIOLOGY REPORT*  Clinical Data:  Chest pain radiating to the back.  Epigastric pain. Nausea and vomiting.  CT ANGIOGRAPHY CHEST, ABDOMEN AND PELVIS  Technique:  Multidetector CT imaging through the chest, abdomen and pelvis was performed using the standard protocol during bolus administration of intravenous contrast.  Multiplanar reconstructed images including MIPs were obtained and reviewed to evaluate the vascular anatomy.  Contrast: OMNIPAQUE IOHEXOL 350 MG/ML SOLN  Comparison:   None.  CTA CHEST  Findings:  Noncontrast CT images of the chest demonstrate no evidence of intramural thrombus in the thoracic aorta.  Suggestion of minimal calcification in the coronary arteries.  Contrast enhanced images demonstrate normal caliber thoracic aorta. No evidence of aneurysm.  Thoracic aorta is patent without evidence of dissection or significant thrombus.  Normal appearance of the great vessels.  Visualized pulmonary arteries appear patent without evidence of central pulmonary embolus.  Normal heart size.  No significant lymphadenopathy in the chest. The esophagus is decompressed.  Thyroid gland appears atrophic.  No pleural effusions.  Visualization of the lungs is limited due to respiratory motion artifact but there appears to be a patchy mosaic pattern suggesting edema or emphysema.  No focal consolidation.  No pneumothorax.  Airways appear patent.  Degenerative changes in the thoracic spine with normal alignment.  No destructive bone lesions are appreciated.   Review of the MIP images confirms the  above findings.  IMPRESSION: No evidence of thoracic aortic dissection.  CTA ABDOMEN AND PELVIS  Findings:  Normal caliber abdominal aorta.  No aneurysm.  The abdominal aorta, celiac axis, superior mesenteric artery, bilateral single renal arteries, the inferior mesenteric artery, and bilateral iliac, common iliac, and femoral arteries appear patent. No evidence of dissection or significant thrombus.  The gallbladder is distended with suggestion of sludge or possibly layering small stones.  No wall thickening or inflammatory infiltration is suggested.  The liver, spleen, pancreas, adrenal glands, inferior vena cava, and retroperitoneal lymph nodes are unremarkable.  Sub centimeter parenchymal cysts in both kidneys. No evidence of hydronephrosis or solid mass.  The esophagus, small bowel, and colon are mostly decompressed.  No abnormal bowel distension.  No free air or free fluid in the abdomen.  Pelvis:  The uterus appears atrophic or surgically absent.  No abnormal adnexal masses.  Rectosigmoid colon is stool-filled without evidence of diverticulitis.  The appendix is normal. No free or loculated pelvic fluid collections.  No significant pelvic lymphadenopathy.  Mild degenerative changes in the lumbar spine.  Normal alignment. No destructive bone lesions appreciated.   Review of the MIP images confirms the above findings.  IMPRESSION: No evidence of aneurysm or dissection of the abdominal aorta.  The gallbladder is distended and contains sludge or small stones.   Original Report Authenticated By: Burman Nieves, M.D.   Ct Angio Abd/pel W/ And/or W/o  09/30/2012   *RADIOLOGY REPORT*  Clinical Data:  Chest pain radiating to the back.  Epigastric pain. Nausea and vomiting.  CT ANGIOGRAPHY CHEST, ABDOMEN AND PELVIS  Technique:  Multidetector CT imaging through the chest, abdomen and pelvis was performed using the standard protocol during bolus administration of intravenous contrast.  Multiplanar reconstructed images  including MIPs were obtained and reviewed to evaluate the vascular anatomy.  Contrast: OMNIPAQUE IOHEXOL 350 MG/ML SOLN  Comparison:   None.  CTA CHEST  Findings:  Noncontrast CT images of the chest demonstrate no evidence of intramural thrombus in the thoracic aorta.  Suggestion of minimal calcification in the coronary arteries.  Contrast enhanced images demonstrate normal caliber thoracic aorta. No evidence of aneurysm.  Thoracic aorta is patent without evidence of dissection or significant thrombus.  Normal appearance of the great vessels.  Visualized pulmonary arteries appear patent without evidence of central pulmonary embolus.  Normal heart size.  No significant lymphadenopathy in the chest. The esophagus is decompressed.  Thyroid gland appears atrophic.  No pleural effusions.  Visualization of the lungs is limited due to respiratory motion artifact but there appears to be a patchy mosaic pattern suggesting edema or emphysema.  No focal consolidation.  No pneumothorax.  Airways appear patent.  Degenerative changes in the thoracic spine with normal alignment.  No destructive bone lesions are appreciated.   Review of the MIP images confirms the above findings.  IMPRESSION: No evidence of thoracic aortic dissection.  CTA ABDOMEN AND PELVIS  Findings:  Normal caliber abdominal aorta.  No aneurysm.  The abdominal aorta, celiac axis, superior mesenteric artery, bilateral single renal arteries, the inferior mesenteric artery, and bilateral iliac, common iliac, and femoral arteries appear patent. No evidence of dissection or significant thrombus.  The gallbladder is distended with suggestion of sludge or possibly layering small stones.  No wall thickening or inflammatory infiltration is suggested.  The liver, spleen, pancreas, adrenal glands, inferior vena cava, and retroperitoneal lymph nodes are unremarkable.  Sub centimeter parenchymal cysts in both kidneys. No evidence of hydronephrosis or solid mass.  The  esophagus, small bowel, and colon are mostly decompressed.  No abnormal bowel distension.  No free air or free fluid in the abdomen.  Pelvis:  The uterus appears atrophic or surgically absent.  No abnormal adnexal masses.  Rectosigmoid colon is stool-filled without evidence of diverticulitis.  The appendix is normal. No free or loculated pelvic fluid collections.  No significant pelvic lymphadenopathy.  Mild degenerative changes in the lumbar spine.  Normal alignment. No destructive bone lesions appreciated.   Review of the MIP images confirms the above findings.  IMPRESSION: No evidence of aneurysm or dissection of the abdominal aorta.  The gallbladder is distended and contains sludge or small stones.   Original Report Authenticated By: Burman Nieves, M.D.    CBC  Recent Labs Lab 09/27/12 1303 09/30/12 0115 09/30/12 0535 09/30/12 1220 10/01/12 0500  WBC 4.0  --  7.4 10.0 21.7*  HGB 5.7* 8.2* 5.7* 6.1* 5.5*  HCT 24.2* 24.0* 23.1* 24.9* 22.4*  PLT 225 Large platelets present  --  202 205 216  MCV 64*  --  61.6* 61.3* 61.4*  MCH 15.2*  --  15.2* 15.0* 15.1*  MCHC 23.6*  --  24.7* 24.5* 24.6*  RDW 22.4*  --  21.6* 21.5* 21.5*  LYMPHSABS 0.9  --  0.4*  --  0.6*  MONOABS  --   --  0.3  --  2.5*  EOSABS 0.1  --  0.0  --  0.0  BASOSABS 0.0  --  0.0  --  0.0    Chemistries   Recent Labs Lab 09/30/12 0039 09/30/12 0115 09/30/12 1220 10/01/12 0500  NA 135 137  --  134*  K 3.3* 3.4*  --  3.3*  CL 95* 94*  --  98  CO2 31  --   --  30  GLUCOSE 123* 125*  --  123*  BUN 9 9  --  8  CREATININE 0.82 1.00 0.76 0.76  CALCIUM 8.9  --   --  8.6  MG  --   --   --  1.8  AST 15  --   --  11  ALT 6  --   --  5  ALKPHOS 52  --   --  49  BILITOT 0.3  --   --  0.6   ------------------------------------------------------------------------------------------------------------------ CrCl is unknown because both a height and weight (above a minimum accepted value) are required for this  calculation. ------------------------------------------------------------------------------------------------------------------ No results found for this basename: HGBA1C,  in the last 72 hours ------------------------------------------------------------------------------------------------------------------ No results found for this basename: CHOL, HDL, LDLCALC, TRIG, CHOLHDL, LDLDIRECT,  in the last 72 hours ------------------------------------------------------------------------------------------------------------------ No results found for this basename: TSH, T4TOTAL, FREET3, T3FREE, THYROIDAB,  in the last 72 hours ------------------------------------------------------------------------------------------------------------------ No results found for this basename: VITAMINB12, FOLATE, FERRITIN, TIBC, IRON, RETICCTPCT,  in the last 72 hours  Coagulation profile No results found for this basename: INR, PROTIME,  in the last 168 hours  No results found for this basename: DDIMER,  in the last 72 hours  Cardiac Enzymes  Recent Labs Lab 09/30/12 1911 09/30/12 2334 10/01/12 0500  TROPONINI <0.30 <0.30 <0.30   ------------------------------------------------------------------------------------------------------------------ No components found with this basename: POCBNP,

## 2012-10-01 NOTE — Progress Notes (Signed)
UA ordered this AM and Urine C&S have not been done.  Patient spiked fever to 102.4.  Even so she feels better.  She wants me to do her surgery, but does not want it tonight.  I will not be available to do surgery until Wednesday.  .Probably should have cholecystectomy tomorrow.  HIDA was + for cystic duct obstruction.  Currently no diffuse peritonitis.  Marta Lamas. Gae Bon, MD, FACS [phone] [facility] Surgery

## 2012-10-02 ENCOUNTER — Inpatient Hospital Stay (HOSPITAL_COMMUNITY): Payer: Medicare Other

## 2012-10-02 ENCOUNTER — Encounter (HOSPITAL_COMMUNITY): Payer: Self-pay | Admitting: Radiology

## 2012-10-02 DIAGNOSIS — A419 Sepsis, unspecified organism: Secondary | ICD-10-CM

## 2012-10-02 DIAGNOSIS — K8020 Calculus of gallbladder without cholecystitis without obstruction: Secondary | ICD-10-CM

## 2012-10-02 DIAGNOSIS — R6521 Severe sepsis with septic shock: Secondary | ICD-10-CM

## 2012-10-02 DIAGNOSIS — J40 Bronchitis, not specified as acute or chronic: Secondary | ICD-10-CM

## 2012-10-02 HISTORY — PX: OTHER SURGICAL HISTORY: SHX169

## 2012-10-02 LAB — CBC WITH DIFFERENTIAL/PLATELET
Basophils Absolute: 0 10*3/uL (ref 0.0–0.1)
Eosinophils Relative: 0 % (ref 0–5)
HCT: 22.6 % — ABNORMAL LOW (ref 36.0–46.0)
Lymphocytes Relative: 2 % — ABNORMAL LOW (ref 12–46)
Monocytes Relative: 8 % (ref 3–12)
Platelets: ADEQUATE 10*3/uL (ref 150–400)
RBC: 3.6 MIL/uL — ABNORMAL LOW (ref 3.87–5.11)
RDW: 21.6 % — ABNORMAL HIGH (ref 11.5–15.5)
WBC: 37.6 10*3/uL — ABNORMAL HIGH (ref 4.0–10.5)

## 2012-10-02 LAB — COMPREHENSIVE METABOLIC PANEL
Alkaline Phosphatase: 78 U/L (ref 39–117)
BUN: 13 mg/dL (ref 6–23)
Creatinine, Ser: 1.02 mg/dL (ref 0.50–1.10)
GFR calc Af Amer: 64 mL/min — ABNORMAL LOW (ref >90)
Glucose, Bld: 110 mg/dL — ABNORMAL HIGH (ref 70–99)
Potassium: 4.6 mEq/L (ref 3.5–5.1)
Total Bilirubin: 0.6 mg/dL (ref 0.3–1.2)
Total Protein: 6.3 g/dL (ref 6.0–8.3)

## 2012-10-02 LAB — SURGICAL PCR SCREEN: Staphylococcus aureus: NEGATIVE

## 2012-10-02 LAB — MAGNESIUM: Magnesium: 2.1 mg/dL (ref 1.5–2.5)

## 2012-10-02 LAB — APTT: aPTT: 42 seconds — ABNORMAL HIGH (ref 24–37)

## 2012-10-02 LAB — GLUCOSE, CAPILLARY: Glucose-Capillary: 89 mg/dL (ref 70–99)

## 2012-10-02 LAB — MRSA PCR SCREENING: MRSA by PCR: NEGATIVE

## 2012-10-02 LAB — TSH: TSH: 0.021 u[IU]/mL — ABNORMAL LOW (ref 0.350–4.500)

## 2012-10-02 LAB — PROCALCITONIN: Procalcitonin: 1.56 ng/mL

## 2012-10-02 MED ORDER — ZOLPIDEM TARTRATE 5 MG PO TABS: 5.0000 mg | ORAL_TABLET | Freq: Every evening | ORAL | Status: DC | PRN

## 2012-10-02 MED ORDER — IOHEXOL 300 MG/ML  SOLN
50.0000 mL | Freq: Once | INTRAMUSCULAR | Status: AC | PRN
  Administered 2012-10-02: 10 mL via INTRAVENOUS

## 2012-10-02 MED ORDER — DEXTROSE 50 % IV SOLN
INTRAVENOUS | Status: AC
  Filled 2012-10-02: qty 50

## 2012-10-02 MED ORDER — SODIUM CHLORIDE 0.9 % IV SOLN
INTRAVENOUS | Status: AC
  Administered 2012-10-02 – 2012-10-04 (×4): via INTRAVENOUS

## 2012-10-02 MED ORDER — FENTANYL CITRATE 0.05 MG/ML IJ SOLN
INTRAMUSCULAR | Status: AC
  Filled 2012-10-02: qty 2

## 2012-10-02 MED ORDER — MIDAZOLAM HCL 2 MG/2ML IJ SOLN
INTRAMUSCULAR | Status: AC | PRN
  Administered 2012-10-02 (×2): 1 mg via INTRAVENOUS

## 2012-10-02 MED ORDER — MEPERIDINE HCL 50 MG/ML IJ SOLN
INTRAMUSCULAR | Status: AC
  Filled 2012-10-02: qty 1

## 2012-10-02 MED ORDER — FENTANYL CITRATE 0.05 MG/ML IJ SOLN
INTRAMUSCULAR | Status: AC | PRN
  Administered 2012-10-02 (×3): 50 ug via INTRAVENOUS

## 2012-10-02 MED ORDER — MIDAZOLAM HCL 2 MG/2ML IJ SOLN
INTRAMUSCULAR | Status: AC
  Filled 2012-10-02: qty 2

## 2012-10-02 MED ORDER — DEXTROSE-NACL 5-0.45 % IV SOLN
INTRAVENOUS | Status: DC
  Administered 2012-10-02: 09:00:00 via INTRAVENOUS

## 2012-10-02 MED ORDER — DEXTROSE 50 % IV SOLN: 25.0000 mL | Freq: Once | INTRAVENOUS | Status: AC | PRN

## 2012-10-02 MED ORDER — SODIUM CHLORIDE 0.9 % IV BOLUS (SEPSIS)
500.0000 mL | Freq: Once | INTRAVENOUS | Status: AC
  Administered 2012-10-02: 500 mL via INTRAVENOUS

## 2012-10-02 MED ORDER — MEPERIDINE HCL 25 MG/ML IJ SOLN
INTRAMUSCULAR | Status: AC | PRN
  Administered 2012-10-02: 50 mg via INTRAVENOUS

## 2012-10-02 MED ORDER — ENOXAPARIN SODIUM 40 MG/0.4ML ~~LOC~~ SOLN
40.0000 mg | SUBCUTANEOUS | Status: DC
  Administered 2012-10-03 – 2012-10-09 (×7): 40 mg via SUBCUTANEOUS
  Filled 2012-10-02 (×10): qty 0.4

## 2012-10-02 MED ORDER — OXYCODONE HCL 5 MG PO TABS
5.0000 mg | ORAL_TABLET | ORAL | Status: DC | PRN
  Administered 2012-10-02 – 2012-10-03 (×5): 5 mg via ORAL
  Administered 2012-10-04 – 2012-10-12 (×6): 10 mg via ORAL
  Filled 2012-10-02 (×2): qty 1
  Filled 2012-10-02 (×7): qty 2
  Filled 2012-10-02 (×3): qty 1
  Filled 2012-10-02: qty 2

## 2012-10-02 MED ORDER — BIOTENE DRY MOUTH MT LIQD
15.0000 mL | Freq: Two times a day (BID) | OROMUCOSAL | Status: DC
  Administered 2012-10-03 – 2012-10-11 (×10): 15 mL via OROMUCOSAL

## 2012-10-02 MED ORDER — CHLORHEXIDINE GLUCONATE 0.12 % MT SOLN
15.0000 mL | Freq: Two times a day (BID) | OROMUCOSAL | Status: DC
  Administered 2012-10-02 – 2012-10-04 (×4): 15 mL via OROMUCOSAL
  Filled 2012-10-02 (×3): qty 15

## 2012-10-02 NOTE — Progress Notes (Signed)
Pt heart rate in the 120's bp 103/59 temp 102 o2 stats 84 on room air pt was given o2 2l nasal cannula and 650mg  of tylenol po for fever HR decreased to 113. Will continue to monitor. Ilean Skill LPN

## 2012-10-02 NOTE — H&P (Signed)
[name] is an 67 y.o. female.   Chief Complaint:  RUQ Abd pain; N/V x 3 days Elevated wbc( 37.6) Cholecystitis per [place] HIDA + obstruction T max: 102.7 Hypotensive; septic - transfer to unit now Scheduled for percutaneous cholecystostomy drain placement  HPI: polycythemia Vera (hg 5.5); UTI; hypothyroid; fibromyalgia; HTN; PAC; CAD  Past Medical History  Diagnosis Date  . 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 Fibromyalgia   . Hypothyroidism   . Essential hypertension   . Postmenopausal state     on hormone replacement therapy  . Aortic valve sclerosis     by echocardiogram 12/04/2009  . PAC (premature atrial contraction)     history of  . Coronary artery disease   . Hyperlipidemia   . History of echocardiogram 12/04/2009    showed mild ventricle hypertrophy and normal EF at 55-60% -- mild aortic valve sclerosis  -- trace mitral regurgitation -- slightly pulmonary artery pressure at 47  --  [name], M.D.   . Fatigue     History reviewed. No pertinent past surgical history.  Family History  Problem Relation Age of Onset  . Heart attack Father   . Coronary artery disease Mother     had aortic valve replacement   Social History:  reports that she has never smoked. She does not have any smokeless tobacco history on file. She reports that she does not drink alcohol or use illicit drugs.  Allergies:  Allergies  Allergen Reactions  . Calcium Channel Blockers   . Codeine   . Penicillins   . Synthroid (Levothyroxine Sodium)     Medications Prior to Admission  Medication Sig Dispense Refill  . albuterol-ipratropium (COMBIVENT) 18-103 MCG/ACT inhaler Inhale 2 puffs into the lungs every 6 (six) hours as needed for wheezing.      Marland Kitchen ALPRAZolam (XANAX) 0.5 MG tablet Take 1 tablet (0.5 mg total) by mouth every 6 (six) hours as needed.  120 tablet  5  . aspirin 325 MG tablet Take 325 mg by mouth daily.        . bisoprolol (ZEBETA) 10 MG tablet Take  10 mg by mouth daily.      . cholecalciferol (VITAMIN D) 1000 UNITS tablet Take 3,000 Units by mouth daily.       . clopidogrel (PLAVIX) 75 MG tablet Take 75 mg by mouth daily.      . cyclobenzaprine (FLEXERIL) 10 MG tablet Take 10 mg by mouth 3 (three) times daily as needed for muscle spasms.       . diphenhydrAMINE (BENADRYL) 25 MG tablet Take 50 mg by mouth every 8 (eight) hours as needed.       Marland Kitchen DOCUSATE SODIUM PO Take 1 tablet by mouth at bedtime.        Marland Kitchen estrogens, conjugated, (PREMARIN) 0.625 MG tablet Take 0.625 mg by mouth 2 (two) times daily.        . famotidine (PEPCID) 20 MG tablet Take 1 tablet (20 mg total) by mouth 2 (two) times daily.  60 tablet  11  . fexofenadine (ALLEGRA) 180 MG tablet Take 180 mg by mouth daily.        . fish oil-omega-3 fatty acids 1000 MG capsule Take 1 g by mouth daily.        . fluoxymesterone (ANDROXY) 10 MG tablet Take 2.5 mg by mouth daily.       . furosemide (LASIX) 40 MG tablet Take 1 tablet (40 mg total) by mouth  2 (two) times daily.  60 tablet  11  . Garlic Oil 1000 MG CAPS Take 1 capsule by mouth daily.       . hydrochlorothiazide (MICROZIDE) 12.5 MG capsule Take 1 capsule (12.5 mg total) by mouth daily.  30 capsule  6  . lidocaine-prilocaine (EMLA) cream Apply topically as needed. Apply to phlebotomy site at least one hour prior to phlebotomy.  30 g  0  . lisinopril (PRINIVIL,ZESTRIL) 10 MG tablet Take 10 mg by mouth 2 (two) times daily.       . magnesium oxide (MAG-OX) 400 MG tablet Take 1,200 mg by mouth daily.        Marland Kitchen oxyCODONE (OXYCONTIN) 10 MG 12 hr tablet Take 1 tablet (10 mg total) by mouth every 12 (twelve) hours. Brand name medically necessary.  60 tablet  0  . oxyCODONE (OXYCONTIN) 20 MG 12 hr tablet Take 1 tablet (20 mg total) by mouth every 8 (eight) hours. Brand name medically necessary.  90 tablet  0  . potassium chloride (K-DUR) 10 MEQ tablet Take 80 mEq by mouth daily.      . promethazine (PHENERGAN) 25 MG tablet Take 25 mg by  mouth every 8 (eight) hours as needed for nausea.       Marland Kitchen ROXICODONE 15 MG immediate release tablet Take 1 tablet (15 mg total) by mouth every 8 (eight) hours as needed for pain. Brand name medically necessary.  90 tablet  0  . Sennosides (SENNA LAX PO) Take 1 tablet by mouth at bedtime.        . temazepam (RESTORIL) 30 MG capsule Take 30 mg by mouth at bedtime as needed for sleep.      Marland Kitchen thyroid (ARMOUR) 60 MG tablet Take 150 mg by mouth daily before breakfast.      . Valerian 100 MG CAPS Take 1 capsule by mouth daily.         Results for orders placed during the hospital encounter of 09/30/12 (from the past 48 hour(s))  CREATININE, SERUM     Status: Abnormal   Collection Time    09/30/12 12:20 PM      Result Value Range   Creatinine, Ser 0.76  0.50 - 1.10 mg/dL   GFR calc non Af Amer 84 (*) >90 mL/min   GFR calc Af Amer >90  >90 mL/min   Comment:            The eGFR has been calculated     using the CKD EPI equation.     This calculation has not been     validated in all clinical     situations.     eGFR's persistently     <90 mL/min signify     possible Chronic Kidney Disease.  CBC     Status: Abnormal   Collection Time    09/30/12 12:20 PM      Result Value Range   WBC 10.0  4.0 - 10.5 K/uL   RBC 4.06  3.87 - 5.11 MIL/uL   Hemoglobin 6.1 (*) 12.0 - 15.0 g/dL   Comment: CRITICAL VALUE NOTED.  VALUE IS CONSISTENT WITH PREVIOUSLY REPORTED AND CALLED VALUE.   HCT 24.9 (*) 36.0 - 46.0 %   MCV 61.3 (*) 78.0 - 100.0 fL   MCH 15.0 (*) 26.0 - 34.0 pg   MCHC 24.5 (*) 30.0 - 36.0 g/dL   RDW 40.9 (*) 81.1 - 91.4 %   Platelets 205  150 - 400 K/uL  Comment: LARGE PLATELETS PRESENT     PLATELET COUNT CONFIRMED BY SMEAR  TROPONIN I     Status: None   Collection Time    09/30/12  7:11 PM      Result Value Range   Troponin I <0.30  <0.30 ng/mL   Comment:            Due to the release kinetics of cTnI,     a negative result within the first hours     of the onset of symptoms does  not rule out     myocardial infarction with certainty.     If myocardial infarction is still suspected,     repeat the test at appropriate intervals.  TROPONIN I     Status: None   Collection Time    09/30/12 11:34 PM      Result Value Range   Troponin I <0.30  <0.30 ng/mL   Comment:            Due to the release kinetics of cTnI,     a negative result within the first hours     of the onset of symptoms does not rule out     myocardial infarction with certainty.     If myocardial infarction is still suspected,     repeat the test at appropriate intervals.  COMPREHENSIVE METABOLIC PANEL     Status: Abnormal   Collection Time    10/01/12  5:00 AM      Result Value Range   Sodium 134 (*) 135 - 145 mEq/L   Potassium 3.3 (*) 3.5 - 5.1 mEq/L   Chloride 98  96 - 112 mEq/L   CO2 30  19 - 32 mEq/L   Glucose, Bld 123 (*) 70 - 99 mg/dL   BUN 8  6 - 23 mg/dL   Creatinine, Ser 2.13  0.50 - 1.10 mg/dL   Calcium 8.6  8.4 - 08.6 mg/dL   Total Protein 6.0  6.0 - 8.3 g/dL   Albumin 3.0 (*) 3.5 - 5.2 g/dL   AST 11  0 - 37 U/L   ALT 5  0 - 35 U/L   Alkaline Phosphatase 49  39 - 117 U/L   Total Bilirubin 0.6  0.3 - 1.2 mg/dL   GFR calc non Af Amer 84 (*) >90 mL/min   GFR calc Af Amer >90  >90 mL/min   Comment:            The eGFR has been calculated     using the CKD EPI equation.     This calculation has not been     validated in all clinical     situations.     eGFR's persistently     <90 mL/min signify     possible Chronic Kidney Disease.  CBC WITH DIFFERENTIAL     Status: Abnormal   Collection Time    10/01/12  5:00 AM      Result Value Range   WBC 21.7 (*) 4.0 - 10.5 K/uL   Comment: WHITE COUNT CONFIRMED ON SMEAR   RBC 3.65 (*) 3.87 - 5.11 MIL/uL   Hemoglobin 5.5 (*) 12.0 - 15.0 g/dL   Comment: CRITICAL VALUE NOTED.  VALUE IS CONSISTENT WITH PREVIOUSLY REPORTED AND CALLED VALUE.   HCT 22.4 (*) 36.0 - 46.0 %   MCV 61.4 (*) 78.0 - 100.0 fL   MCH 15.1 (*) 26.0 - 34.0 pg   MCHC  24.6 (*) 30.0 - 36.0 g/dL  RDW 21.5 (*) 11.5 - 15.5 %   Platelets 216  150 - 400 K/uL   Comment: PLATELET COUNT CONFIRMED BY SMEAR   Neutrophils Relative % 87 (*) 43 - 77 %   Neutro Abs 19.6 (*) 1.7 - 7.7 K/uL   Lymphocytes Relative 3 (*) 12 - 46 %   Lymphs Abs 0.6 (*) 0.7 - 4.0 K/uL   Monocytes Relative 11  3 - 12 %   Monocytes Absolute 2.5 (*) 0.1 - 1.0 K/uL   Eosinophils Relative 0  0 - 5 %   Eosinophils Absolute 0.0  0.0 - 0.7 K/uL   Basophils Relative 0  0 - 1 %   Basophils Absolute 0.0  0.0 - 0.1 K/uL  MAGNESIUM     Status: None   Collection Time    10/01/12  5:00 AM      Result Value Range   Magnesium 1.8  1.5 - 2.5 mg/dL  TROPONIN I     Status: None   Collection Time    10/01/12  5:00 AM      Result Value Range   Troponin I <0.30  <0.30 ng/mL   Comment:            Due to the release kinetics of cTnI,     a negative result within the first hours     of the onset of symptoms does not rule out     myocardial infarction with certainty.     If myocardial infarction is still suspected,     repeat the test at appropriate intervals.  URINALYSIS, ROUTINE W REFLEX MICROSCOPIC     Status: Abnormal   Collection Time    10/01/12  9:04 PM      Result Value Range   Color, Urine AMBER (*) YELLOW   Comment: BIOCHEMICALS MAY BE AFFECTED BY COLOR   APPearance CLOUDY (*) CLEAR   Specific Gravity, Urine 1.018  1.005 - 1.030   pH 5.5  5.0 - 8.0   Glucose, UA NEGATIVE  NEGATIVE mg/dL   Hgb urine dipstick TRACE (*) NEGATIVE   Bilirubin Urine NEGATIVE  NEGATIVE   Ketones, ur NEGATIVE  NEGATIVE mg/dL   Protein, ur 30 (*) NEGATIVE mg/dL   Urobilinogen, UA 0.2  0.0 - 1.0 mg/dL   Nitrite NEGATIVE  NEGATIVE   Leukocytes, UA LARGE (*) NEGATIVE  URINE MICROSCOPIC-ADD ON     Status: Abnormal   Collection Time    10/01/12  9:04 PM      Result Value Range   Squamous Epithelial / LPF MANY (*) RARE   WBC, UA TOO NUMEROUS TO COUNT  <3 WBC/hpf   RBC / HPF 3-6  <3 RBC/hpf   Bacteria, UA MANY  (*) RARE   Casts HYALINE CASTS (*) NEGATIVE  SURGICAL PCR SCREEN     Status: None   Collection Time    10/01/12 11:28 PM      Result Value Range   MRSA, PCR NEGATIVE  NEGATIVE   Staphylococcus aureus NEGATIVE  NEGATIVE   Comment:            The Xpert SA Assay (FDA     approved for NASAL specimens     in patients over 57 years of age),     is one component of     a comprehensive surveillance     program.  Test performance has     been validated by [employer] for patients greater  than or equal to 40 year old.     It is not intended     to diagnose infection nor to     guide or monitor treatment.  COMPREHENSIVE METABOLIC PANEL     Status: Abnormal   Collection Time    10/02/12  5:05 AM      Result Value Range   Sodium 134 (*) 135 - 145 mEq/L   Potassium 4.6  3.5 - 5.1 mEq/L   Chloride 99  96 - 112 mEq/L   CO2 24  19 - 32 mEq/L   Glucose, Bld 110 (*) 70 - 99 mg/dL   BUN 13  6 - 23 mg/dL   Creatinine, Ser 1.61  0.50 - 1.10 mg/dL   Calcium 8.7  8.4 - 09.6 mg/dL   Total Protein 6.3  6.0 - 8.3 g/dL   Albumin 3.0 (*) 3.5 - 5.2 g/dL   AST 19  0 - 37 U/L   ALT 8  0 - 35 U/L   Alkaline Phosphatase 78  39 - 117 U/L   Total Bilirubin 0.6  0.3 - 1.2 mg/dL   GFR calc non Af Amer 55 (*) >90 mL/min   GFR calc Af Amer 64 (*) >90 mL/min   Comment:            The eGFR has been calculated     using the CKD EPI equation.     This calculation has not been     validated in all clinical     situations.     eGFR's persistently     <90 mL/min signify     possible Chronic Kidney Disease.  CBC WITH DIFFERENTIAL     Status: Abnormal   Collection Time    10/02/12  5:05 AM      Result Value Range   WBC 37.6 (*) 4.0 - 10.5 K/uL   Comment: WHITE COUNT CONFIRMED ON SMEAR   RBC 3.60 (*) 3.87 - 5.11 MIL/uL   Hemoglobin 5.5 (*) 12.0 - 15.0 g/dL   Comment: CRITICAL VALUE NOTED.  VALUE IS CONSISTENT WITH PREVIOUSLY REPORTED AND CALLED VALUE.   HCT 22.6 (*) 36.0 - 46.0 %   MCV 62.8 (*)  78.0 - 100.0 fL   MCH 15.3 (*) 26.0 - 34.0 pg   MCHC 24.3 (*) 30.0 - 36.0 g/dL   RDW 04.5 (*) 40.9 - 81.1 %   Platelets    150 - 400 K/uL   Value: PLATELET CLUMPS NOTED ON SMEAR, COUNT APPEARS ADEQUATE   Neutrophils Relative % 90 (*) 43 - 77 %   Lymphocytes Relative 2 (*) 12 - 46 %   Monocytes Relative 8  3 - 12 %   Eosinophils Relative 0  0 - 5 %   Basophils Relative 0  0 - 1 %   Neutro Abs 33.8 (*) 1.7 - 7.7 K/uL   Lymphs Abs 0.8  0.7 - 4.0 K/uL   Monocytes Absolute 3.0 (*) 0.1 - 1.0 K/uL   Eosinophils Absolute 0.0  0.0 - 0.7 K/uL   Basophils Absolute 0.0  0.0 - 0.1 K/uL   RBC Morphology RARE NRBCs     Comment: TARGET CELLS   Smear Review LARGE PLATELETS PRESENT    MAGNESIUM     Status: None   Collection Time    10/02/12  5:05 AM      Result Value Range   Magnesium 2.1  1.5 - 2.5 mg/dL   Nm Hepatobiliary  11/27/4780   *RADIOLOGY REPORT*  Clinical Data: Gallstones, possible acute cholecystitis  NUCLEAR MEDICINE HEPATOHBILIARY INCLUDE GB  Radiopharmaceutical:  5.5 mCi 104m technetium Choletec  Comparison: CT abdomen/pelvis 09/30/2012  Findings: Normal hepatic parenchymal uptake of the radiotracer with excretion into the intrahepatic and common bile duct.  Radiotracer enters the small bowel progresses distally.  At 60 minutes, there was no radiotracer within the gallbladder lumen.  Therefore, 3 mg of morphine was administered intravenously.  Continued nonvisualization of the gallbladder at 30 minutes post morphine administration.  IMPRESSION:  Nonvisualization of the gallbladder at 60 minutes and also 30 minutes post morphine challenge.  Findings are consistent with obstruction of the cystic duct as can be seen in acute cholecystitis.  These results were called by telephone on 10/01/2012 at 3:20 p.m. to Dr. Lindie Spruce, who verbally acknowledged these results.   Original Report Authenticated By: Malachy Moan, M.D.    Review of Systems  Constitutional: Positive for weight loss and  malaise/fatigue.  Respiratory: Negative for shortness of breath.   Cardiovascular: Negative for chest pain.  Gastrointestinal: Positive for nausea, vomiting and abdominal pain.  Neurological: Positive for weakness and headaches.    Blood pressure 78/45, pulse 100, temperature 100.9 F (38.3 C), temperature source Oral, resp. rate 16, SpO2 100.00%. Physical Exam  Constitutional: She is oriented to person, place, and time.  Minimally groggy  Cardiovascular: Normal rate, regular rhythm and normal heart sounds.   No murmur heard. Respiratory: Effort normal and breath sounds normal.  GI: Soft. Bowel sounds are normal. She exhibits distension. There is tenderness.  Musculoskeletal: Normal range of motion.  Neurological: She is alert and oriented to person, place, and time.  Skin: Skin is warm.  Psychiatric: She has a normal mood and affect. Her behavior is normal. Judgment and thought content normal.     Assessment/Plan Cholecystis; +HIDA High fever; low BP; septic Scheduled for perc chole drain Pt and husband aware of procedure benefits and risks and agreeable to proceed Consent signed and in chart  Tanicia Wolaver A 10/02/2012, 11:12 AM

## 2012-10-02 NOTE — Progress Notes (Addendum)
  Subjective: She has low bp, tachycardia, fever, ab pain  Objective: Vital signs in last 24 hours: Temp:  [98.4 F (36.9 C)-102.7 F (39.3 C)] 100.9 F (38.3 C) (07/07 0624) Pulse Rate:  [69-127] 100 (07/07 0932) Resp:  [16-21] 16 (07/07 0932) BP: (78-136)/(45-69) 99/56 mmHg (07/07 1135) SpO2:  [95 %-100 %] 100 % (07/07 0932) Last BM Date: 09/28/12  Intake/Output from previous day:   Intake/Output this shift:    General appearance: no distress GI: tender diffusely most prominent in ruq with murphys sign  Lab Results:   Recent Labs  10/01/12 0500 10/02/12 0505  WBC 21.7* 37.6*  HGB 5.5* 5.5*  HCT 22.4* 22.6*  PLT 216 PLATELET CLUMPS NOTED ON SMEAR, COUNT APPEARS ADEQUATE   BMET  Recent Labs  10/01/12 0500 10/02/12 0505  NA 134* 134*  K 3.3* 4.6  CL 98 99  CO2 30 24  GLUCOSE 123* 110*  BUN 8 13  CREATININE 0.76 1.02  CALCIUM 8.6 8.7   PT/INR  Recent Labs  10/02/12 1021  LABPROT 18.1*  INR 1.54*    Studies/Results: Nm Hepatobiliary  10/01/2012   *RADIOLOGY REPORT*  Clinical Data: Gallstones, possible acute cholecystitis  NUCLEAR MEDICINE HEPATOHBILIARY INCLUDE GB  Radiopharmaceutical:  5.5 mCi 64m technetium Choletec  Comparison: CT abdomen/pelvis 09/30/2012  Findings: Normal hepatic parenchymal uptake of the radiotracer with excretion into the intrahepatic and common bile duct.  Radiotracer enters the small bowel progresses distally.  At 60 minutes, there was no radiotracer within the gallbladder lumen.  Therefore, 3 mg of morphine was administered intravenously.  Continued nonvisualization of the gallbladder at 30 minutes post morphine administration.  IMPRESSION:  Nonvisualization of the gallbladder at 60 minutes and also 30 minutes post morphine challenge.  Findings are consistent with obstruction of the cystic duct as can be seen in acute cholecystitis.  These results were called by telephone on 10/01/2012 at 3:20 p.m. to Dr. Lindie Spruce, who verbally  acknowledged these results.   Original Report Authenticated By: Malachy Moan, M.D.    Anti-infectives: Anti-infectives   Start     Dose/Rate Route Frequency Ordered Stop   10/01/12 0815  metroNIDAZOLE (FLAGYL) IVPB 500 mg     500 mg 100 mL/hr over 60 Minutes Intravenous Every 8 hours 10/01/12 0810     09/30/12 1200  ciprofloxacin (CIPRO) IVPB 400 mg     400 mg 200 mL/hr over 60 Minutes Intravenous Every 12 hours 09/30/12 1057        Assessment/Plan: 1.Acute cholecystitis  She clearly is ill from her gallbladder although her urine does appear abnormal.  We discussed options including perc drain and surgery.  I think surgery will be somewhat problematic right now given aspirin use and her low hct.  This would definitively take care of the problem but certainly transfusions are a risk which she really does not want.  We discussed a drain as a temporary fix to this with a cholecystectomy later.  There is a chance this might not be successful and then would proceed with surgery but I think overall this is safer.  We discussed these options with she and her husband for about 30 minutes.  Will plan drain today. 2. Sepsis- tx to icu, abx, needs gb drained, treat ua 3. p vera will follow hct, oncology has seen    [facility] 10/02/2012

## 2012-10-02 NOTE — Progress Notes (Signed)
Hypoglycemic Event  CBG: 63  Treatment: D50 IV 25 mL  Symptoms: None  Follow-up CBG: Time:1445 CBG Result:135  Possible Reasons for Event: Inadequate meal intake  Comments/MD notified:Dana Allan, Doristine Johns  Remember to initiate Hypoglycemia Order Set & complete

## 2012-10-02 NOTE — Consult Note (Addendum)
I very much appreciate the great care being provided by the surgical service in the hospitalist.  [name] will likely undergo laparoscopic cholecystectomy on Wednesday.  Her hemoglobin, for her, is normal. She is very viscous with her blood and has had problems with cerebrovascular issues if her hemoglobin gets above 7. She currently is off aspirin and Plavix.  She is on IV antibiotics. She is afebrile. White cell count is pending. She's not having much in the way of pain with her gallbladder.  She is on OxyContin and oxycodone chronically which help with her fibromyalgia pain.  I would make sure that Dr. Patty Sermons, cardiologist, is "on board" as she does have cardiovascular issues that need to be cleared before surgery.  Her temperature is 100.9. Blood pressure 102/55. Lungs are clear bilaterally. Cardiac exam tachycardic but regular. Abdomen is soft. Bowel sounds are decreased. There is some tenderness to palpation over at the. There is no spinal megaly. Extremities shows no clubbing cyanosis or edema. Neurological exam is nonfocal.  Again, there is no need for a transfusion. This is her normal hemoglobin that she functions best with.  We will continue to follow her along and help out in any way possible.

## 2012-10-02 NOTE — Progress Notes (Addendum)
[facility] consult note                                                                                Patient Demographics  [name], is a 67 y.o. female, DOB - 1945-07-22, ZOX:[id number], WJX:[id number]  Admit date - 09/30/2012  Admitting Physician Md Montez Morita, MD  Outpatient Primary MD for the patient is [name], MD  LOS - 2   Chief Complaint  Patient presents with  . Chest Pain        Assessment & Plan    1. Cholelithiases with high suspicion for acute cholecystitis, patient continues to have extremely high white count along with fevers and low blood pressures, close to being septic, she was on IV Cipro and I had added IV Flagyl on 10/01/2012, since blood pressures are low I will discontinue her ACE inhibitor and change IV fluids to normal saline + IVF bolus, will the primary team general surgery to look into surgical intervention as soon as possible, I have told this to patient and her husband bed side however they are wanting to wait till then stay for surgical procedure, I have told him clearly that in my opinion surgery should be performed as soon as possible. I will call and inform surgery the primary team of my opinion, Blood cultures now and move to step down.   Addendum - much more hypotensive, IVF bolus running, D/W PCCM Dr Delford Field who suggests to cancel step down and to send to ICU directly.   2. HTN- blood pressure now on the lower side due to #1 above, stop lisinopril, change IV fluids to normal saline, holding parameters written for bisoprolol.   3. PCV; per Haematology will leave Hg at current level , if transfusion desired please contact haem -onc, ordered all Lab work via Pediatric tubes.          4. CAD; per Pt's Cardiologist note fm 09/28/2010, She does not have a history of known ischemic heart disease. She had an echocardiogram in 12/04/09 which showed normal left ventricular systolic function with an ejection fraction of 55-60% and normal  diastolic function and mild aortic sclerosis. Trop -ve x 3, she remains chest pain free.           5. Hypothyroidism; per patient and husband unable to take Synthroid. On Armour Thyroid 120 mg daily (patient has home meds) , stable , outpt PCP followup post DC.                   6. Fibromyalgia; on Home med regimen , rsume once taking PO.           7.Bowel Regimen; on home regimen            8.Low K - replaced and stable.             9. UTI - on cipro, follow Ur and Blood cultures.      DVT Prophylaxis  Lovenox    Lab Results  Component Value Date   PLT PLATELET CLUMPS NOTED ON SMEAR, COUNT APPEARS ADEQUATE 10/02/2012    Medications  Scheduled Meds: . bisoprolol  10 mg Oral Daily  . ciprofloxacin  400 mg Intravenous Q12H  . docusate sodium  100 mg Oral Daily  . enoxaparin (LOVENOX) injection  40 mg Subcutaneous Q24H  . estrogens (conjugated)  0.625 mg Oral BID  . ketorolac  30 mg Intravenous Once  . metronidazole  500 mg Intravenous Q8H  . OxyCODONE  10 mg Oral Custom  . OxyCODONE  20 mg Oral Custom  . pantoprazole (PROTONIX) IV  40 mg Intravenous QHS  . senna  1 tablet Oral QHS  . thyroid  150 mg Oral QAC breakfast   Continuous Infusions: . sodium chloride     PRN Meds:.acetaminophen, albuterol-ipratropium, ALPRAZolam, cyclobenzaprine, diphenhydrAMINE, HYDROmorphone (DILAUDID) injection, metoprolol, ondansetron, temazepam  Antibiotics    Anti-infectives   Start     Dose/Rate Route Frequency Ordered Stop   10/01/12 0815  metroNIDAZOLE (FLAGYL) IVPB 500 mg     500 mg 100 mL/hr over 60 Minutes Intravenous Every 8 hours 10/01/12 0810     09/30/12 1200  ciprofloxacin (CIPRO) IVPB 400 mg     400 mg 200 mL/hr over 60 Minutes Intravenous Every 12 hours 09/30/12 1057         Time Spent in minutes  35   Syana Degraffenreid K M.D on 10/02/2012 at 9:25 AM  Between 7am to 7pm - Pager - [phone]  After 7pm go to [email or web address] - password [email or web address]  And look for the  night coverage person covering for me after hours  [facility] Office  [phone]    Subjective:   Srihitha Tagliaferri today has, No headache, No chest pain, +ve abdominal pain - No Nausea, No new weakness tingling or numbness, No Cough - SOB.    Objective:   Filed Vitals:   10/01/12 1754 10/01/12 2053 10/02/12 0507 10/02/12 0624  BP: 130/57 127/60 103/59 102/55  Pulse: 109 69 127 113  Temp: 101 F (38.3 C) 98.4 F (36.9 C) 102.7 F (39.3 C) 100.9 F (38.3 C)  TempSrc:  Oral Oral Oral  Resp: 20 18 21 18   SpO2: 95% 100% 98% 98%    Wt Readings from Last 3 Encounters:  08/16/12 74.844 kg (165 lb)  06/27/12 74.844 kg (165 lb)  04/26/12 78.019 kg (172 lb)    No intake or output data in the 24 hours ending 10/02/12 0925  Exam Awake, but appears tired, No new F.N deficits, Normal affect [place].AT,PERRAL Supple Neck,No JVD, No cervical lymphadenopathy appriciated.  Symmetrical Chest wall movement, Good air movement bilaterally, CTAB RRR,No Gallops,Rubs or new Murmurs, No Parasternal Heave +ve B.Sounds, Abd Soft, +ve RUQ tenderness , No organomegaly appriciated, No rebound - guarding or rigidity. No Cyanosis, Clubbing or edema, No new Rash or bruise      Data Review   Micro Results Recent Results (from the past 240 hour(s))  SURGICAL PCR SCREEN     Status: None   Collection Time    10/01/12 11:28 PM      Result Value Range Status   MRSA, PCR NEGATIVE  NEGATIVE Final   Staphylococcus aureus NEGATIVE  NEGATIVE Final   Comment:            The Xpert SA Assay (FDA     approved for NASAL specimens     in patients over 68 years of age),     is one component of     a comprehensive surveillance     program.  Test performance has     been validated by [employer] for patients greater  than or equal to 54 year old.     It is not intended     to diagnose infection nor to     guide or monitor treatment.    Radiology Reports US Abdomen Complete  09/30/2012    *RADIOLOGY REPORT*  Clinical Data:  Mid epigastric pain.   ABDOMINAL ULTRASOUND COMPLETE  Comparison:  None.  Findings:  Gallbladder:  Multiple stones are identified within the gallbladder measuring up to 8 mm.  No gallbladder wall thickening or pericholecystic fluid.  Negative sonographic Murphy's sign.  Common Bile Duct:  The common bile duct is increased in caliber measuring up to 8.2 mm.  Liver: No focal mass lesion identified.  Within normal limits in parenchymal echogenicity.  IVC:  Appears normal.  Pancreas:  No abnormality identified.  Spleen:  Within normal limits in size and echotexture.  Right kidney:  Normal in size and parenchymal echogenicity.  No evidence of mass or hydronephrosis.  Left kidney:  Normal in size and parenchymal echogenicity.  No evidence of mass or hydronephrosis.  Abdominal Aorta:  No aneurysm identified.  IMPRESSION:  1.  Gallstones. 2.  Increased caliber of the common bile duct which measures up to 8.2 mm. If there is clinical concern for choledocholithiasis an MRCP may be helpful.   Original Report Authenticated By: Signa Kell, M.D.   Dg Chest Port 1 View  09/30/2012   *RADIOLOGY REPORT*  Clinical Data: Mid sternal chest pain since 06:00 p.m.  PORTABLE CHEST - 1 VIEW  Comparison: 04/01/2008  Findings: The heart size and pulmonary vascularity are normal. The lungs appear clear and expanded without focal air space disease or consolidation. No blunting of the costophrenic angles.  No pneumothorax.  Mediastinal contours appear intact.  No significant change since previous study.  IMPRESSION: No evidence of active pulmonary disease.   Original Report Authenticated By: Burman Nieves, M.D.   Ct Angio Chest Aorta W/cm &/or Wo/cm  09/30/2012   *RADIOLOGY REPORT*  Clinical Data:  Chest pain radiating to the back.  Epigastric pain. Nausea and vomiting.  CT ANGIOGRAPHY CHEST, ABDOMEN AND PELVIS  Technique:  Multidetector CT imaging through the chest, abdomen and pelvis was performed  using the standard protocol during bolus administration of intravenous contrast.  Multiplanar reconstructed images including MIPs were obtained and reviewed to evaluate the vascular anatomy.  Contrast: OMNIPAQUE IOHEXOL 350 MG/ML SOLN  Comparison:   None.  CTA CHEST  Findings:  Noncontrast CT images of the chest demonstrate no evidence of intramural thrombus in the thoracic aorta.  Suggestion of minimal calcification in the coronary arteries.  Contrast enhanced images demonstrate normal caliber thoracic aorta. No evidence of aneurysm.  Thoracic aorta is patent without evidence of dissection or significant thrombus.  Normal appearance of the great vessels.  Visualized pulmonary arteries appear patent without evidence of central pulmonary embolus.  Normal heart size.  No significant lymphadenopathy in the chest. The esophagus is decompressed.  Thyroid gland appears atrophic.  No pleural effusions.  Visualization of the lungs is limited due to respiratory motion artifact but there appears to be a patchy mosaic pattern suggesting edema or emphysema.  No focal consolidation.  No pneumothorax.  Airways appear patent.  Degenerative changes in the thoracic spine with normal alignment.  No destructive bone lesions are appreciated.   Review of the MIP images confirms the above findings.  IMPRESSION: No evidence of thoracic aortic dissection.  CTA ABDOMEN AND PELVIS  Findings:  Normal caliber abdominal aorta.  No aneurysm.  The abdominal aorta, celiac axis, superior mesenteric artery, bilateral single renal arteries, the inferior mesenteric artery, and bilateral iliac, common iliac, and femoral arteries appear patent. No evidence of dissection or significant thrombus.  The gallbladder is distended with suggestion of sludge or possibly layering small stones.  No wall thickening or inflammatory infiltration is suggested.  The liver, spleen, pancreas, adrenal glands, inferior vena cava, and retroperitoneal lymph nodes are  unremarkable.  Sub centimeter parenchymal cysts in both kidneys. No evidence of hydronephrosis or solid mass.  The esophagus, small bowel, and colon are mostly decompressed.  No abnormal bowel distension.  No free air or free fluid in the abdomen.  Pelvis:  The uterus appears atrophic or surgically absent.  No abnormal adnexal masses.  Rectosigmoid colon is stool-filled without evidence of diverticulitis.  The appendix is normal. No free or loculated pelvic fluid collections.  No significant pelvic lymphadenopathy.  Mild degenerative changes in the lumbar spine.  Normal alignment. No destructive bone lesions appreciated.   Review of the MIP images confirms the above findings.  IMPRESSION: No evidence of aneurysm or dissection of the abdominal aorta.  The gallbladder is distended and contains sludge or small stones.   Original Report Authenticated By: Burman Nieves, M.D.   Ct Angio Abd/pel W/ And/or W/o  09/30/2012   *RADIOLOGY REPORT*  Clinical Data:  Chest pain radiating to the back.  Epigastric pain. Nausea and vomiting.  CT ANGIOGRAPHY CHEST, ABDOMEN AND PELVIS  Technique:  Multidetector CT imaging through the chest, abdomen and pelvis was performed using the standard protocol during bolus administration of intravenous contrast.  Multiplanar reconstructed images including MIPs were obtained and reviewed to evaluate the vascular anatomy.  Contrast: OMNIPAQUE IOHEXOL 350 MG/ML SOLN  Comparison:   None.  CTA CHEST  Findings:  Noncontrast CT images of the chest demonstrate no evidence of intramural thrombus in the thoracic aorta.  Suggestion of minimal calcification in the coronary arteries.  Contrast enhanced images demonstrate normal caliber thoracic aorta. No evidence of aneurysm.  Thoracic aorta is patent without evidence of dissection or significant thrombus.  Normal appearance of the great vessels.  Visualized pulmonary arteries appear patent without evidence of central pulmonary embolus.  Normal  heart size.  No significant lymphadenopathy in the chest. The esophagus is decompressed.  Thyroid gland appears atrophic.  No pleural effusions.  Visualization of the lungs is limited due to respiratory motion artifact but there appears to be a patchy mosaic pattern suggesting edema or emphysema.  No focal consolidation.  No pneumothorax.  Airways appear patent.  Degenerative changes in the thoracic spine with normal alignment.  No destructive bone lesions are appreciated.   Review of the MIP images confirms the above findings.  IMPRESSION: No evidence of thoracic aortic dissection.  CTA ABDOMEN AND PELVIS  Findings:  Normal caliber abdominal aorta.  No aneurysm.  The abdominal aorta, celiac axis, superior mesenteric artery, bilateral single renal arteries, the inferior mesenteric artery, and bilateral iliac, common iliac, and femoral arteries appear patent. No evidence of dissection or significant thrombus.  The gallbladder is distended with suggestion of sludge or possibly layering small stones.  No wall thickening or inflammatory infiltration is suggested.  The liver, spleen, pancreas, adrenal glands, inferior vena cava, and retroperitoneal lymph nodes are unremarkable.  Sub centimeter parenchymal cysts in both kidneys. No evidence of hydronephrosis or solid mass.  The esophagus, small bowel, and colon are mostly decompressed.  No abnormal bowel distension.  No free air or free fluid in  the abdomen.  Pelvis:  The uterus appears atrophic or surgically absent.  No abnormal adnexal masses.  Rectosigmoid colon is stool-filled without evidence of diverticulitis.  The appendix is normal. No free or loculated pelvic fluid collections.  No significant pelvic lymphadenopathy.  Mild degenerative changes in the lumbar spine.  Normal alignment. No destructive bone lesions appreciated.   Review of the MIP images confirms the above findings.  IMPRESSION: No evidence of aneurysm or dissection of the abdominal aorta.  The  gallbladder is distended and contains sludge or small stones.   Original Report Authenticated By: Burman Nieves, M.D.    CBC  Recent Labs Lab 09/27/12 1303 09/30/12 0115 09/30/12 0535 09/30/12 1220 10/01/12 0500 10/02/12 0505  WBC 4.0  --  7.4 10.0 21.7* 37.6*  HGB 5.7* 8.2* 5.7* 6.1* 5.5* 5.5*  HCT 24.2* 24.0* 23.1* 24.9* 22.4* 22.6*  PLT 225 Large platelets present  --  202 205 216 PLATELET CLUMPS NOTED ON SMEAR, COUNT APPEARS ADEQUATE  MCV 64*  --  61.6* 61.3* 61.4* 62.8*  MCH 15.2*  --  15.2* 15.0* 15.1* 15.3*  MCHC 23.6*  --  24.7* 24.5* 24.6* 24.3*  RDW 22.4*  --  21.6* 21.5* 21.5* 21.6*  LYMPHSABS 0.9  --  0.4*  --  0.6* 0.8  MONOABS  --   --  0.3  --  2.5* 3.0*  EOSABS 0.1  --  0.0  --  0.0 0.0  BASOSABS 0.0  --  0.0  --  0.0 0.0    Chemistries   Recent Labs Lab 09/30/12 0039 09/30/12 0115 09/30/12 1220 10/01/12 0500 10/02/12 0505  NA 135 137  --  134* 134*  K 3.3* 3.4*  --  3.3* 4.6  CL 95* 94*  --  98 99  CO2 31  --   --  30 24  GLUCOSE 123* 125*  --  123* 110*  BUN 9 9  --  8 13  CREATININE 0.82 1.00 0.76 0.76 1.02  CALCIUM 8.9  --   --  8.6 8.7  MG  --   --   --  1.8 2.1  AST 15  --   --  11 19  ALT 6  --   --  5 8  ALKPHOS 52  --   --  49 78  BILITOT 0.3  --   --  0.6 0.6   ------------------------------------------------------------------------------------------------------------------ CrCl is unknown because both a height and weight (above a minimum accepted value) are required for this calculation. ------------------------------------------------------------------------------------------------------------------ No results found for this basename: HGBA1C,  in the last 72 hours ------------------------------------------------------------------------------------------------------------------ No results found for this basename: CHOL, HDL, LDLCALC, TRIG, CHOLHDL, LDLDIRECT,  in the last 72  hours ------------------------------------------------------------------------------------------------------------------ No results found for this basename: TSH, T4TOTAL, FREET3, T3FREE, THYROIDAB,  in the last 72 hours ------------------------------------------------------------------------------------------------------------------ No results found for this basename: VITAMINB12, FOLATE, FERRITIN, TIBC, IRON, RETICCTPCT,  in the last 72 hours  Coagulation profile No results found for this basename: INR, PROTIME,  in the last 168 hours  No results found for this basename: DDIMER,  in the last 72 hours  Cardiac Enzymes  Recent Labs Lab 09/30/12 1911 09/30/12 2334 10/01/12 0500  TROPONINI <0.30 <0.30 <0.30   ------------------------------------------------------------------------------------------------------------------ No components found with this basename: POCBNP,

## 2012-10-02 NOTE — Procedures (Signed)
Successful 49fr RUQ PERC CHOLECYSTOSTOMY NO COMP STABLE 50CC EXUDATIVE BILE ASPIRATED GS/CX SENT FULL REPORT IN PACS

## 2012-10-02 NOTE — Consult Note (Signed)
PULMONARY  / CRITICAL CARE MEDICINE  Name: [name] MRN: [id number] DOB: January 19, 1946    ADMISSION DATE:  09/30/2012 CONSULTATION DATE:  7/5  REFERRING MD :  Lindie Spruce PRIMARY SERVICE: Surgery  CHIEF COMPLAINT:  sepsis  BRIEF PATIENT DESCRIPTION: 67yo female with hx polycythemia vera, admitted 7/5 with epigastric abd pain.  Found to have cholelithiases.  Was being medically managed while waiting for surgery off plavix.  On 7/7 developed hypotension, worsening abd pain, likely sepsis and PCCM consulted.   SIGNIFICANT EVENTS / STUDIES:    LINES / TUBES:   CULTURES: Blood cultures 7/7>>> Urine 7/7>>>  ANTIBIOTICS: Cipro 7/6>>> Flagyl 7/6>>>  HISTORY OF PRESENT ILLNESS:  67yo female with hx polycythemia vera, admitted 7/5 with epigastric abd pain.  Found to have significant cholelithiasis and likely acute cholecystitis.  Developed sepsis picture and PCCM consulted.  Pt/husband only want Dr. Lindie Spruce to do surgery which he is not available for until 7/9.  Currently with high fevers, leukocytosis, abd pain/distension, hypotension.  Mild SOB.  Denies chest pain.    PAST MEDICAL HISTORY :  Past Medical History  Diagnosis Date  . 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 Fibromyalgia   . Hypothyroidism   . Essential hypertension   . Postmenopausal state     on hormone replacement therapy  . Aortic valve sclerosis     by echocardiogram 12/04/2009  . PAC (premature atrial contraction)     history of  . Coronary artery disease   . Hyperlipidemia   . History of echocardiogram 12/04/2009    showed mild ventricle hypertrophy and normal EF at 55-60% -- mild aortic valve sclerosis  -- trace mitral regurgitation -- slightly pulmonary artery pressure at 47  --  [name], M.D.   . Fatigue    History reviewed. No pertinent past surgical history. Prior to Admission medications   Medication Sig Start Date End Date Taking? Authorizing Provider  albuterol-ipratropium  (COMBIVENT) 18-103 MCG/ACT inhaler Inhale 2 puffs into the lungs every 6 (six) hours as needed for wheezing.   Yes Historical Provider, MD  ALPRAZolam [name]) 0.5 MG tablet Take 1 tablet (0.5 mg total) by mouth every 6 (six) hours as needed. 06/27/12  Yes Josph Macho, MD  aspirin 325 MG tablet Take 325 mg by mouth daily.     Yes Historical Provider, MD  bisoprolol (ZEBETA) 10 MG tablet Take 10 mg by mouth daily.   Yes Historical Provider, MD  cholecalciferol (VITAMIN D) 1000 UNITS tablet Take 3,000 Units by mouth daily.    Yes Historical Provider, MD  clopidogrel (PLAVIX) 75 MG tablet Take 75 mg by mouth daily.   Yes Historical Provider, MD  cyclobenzaprine (FLEXERIL) 10 MG tablet Take 10 mg by mouth 3 (three) times daily as needed for muscle spasms.    Yes Historical Provider, MD  diphenhydrAMINE (BENADRYL) 25 MG tablet Take 50 mg by mouth every 8 (eight) hours as needed.    Yes Historical Provider, MD  DOCUSATE SODIUM PO Take 1 tablet by mouth at bedtime.     Yes Historical Provider, MD  estrogens, conjugated, (PREMARIN) 0.625 MG tablet Take 0.625 mg by mouth 2 (two) times daily.     Yes Historical Provider, MD  famotidine (PEPCID) 20 MG tablet Take 1 tablet (20 mg total) by mouth 2 (two) times daily. 07/03/12  Yes [name], MD  fexofenadine (ALLEGRA) 180 MG tablet Take 180 mg by mouth daily.     Yes Historical Provider, MD  fish oil-omega-3 fatty acids 1000 MG capsule Take 1 g by mouth daily.     Yes Historical Provider, MD  fluoxymesterone (ANDROXY) 10 MG tablet Take 2.5 mg by mouth daily.    Yes Historical Provider, MD  furosemide (LASIX) 40 MG tablet Take 1 tablet (40 mg total) by mouth 2 (two) times daily. 09/07/12  Yes [name], MD  Garlic Oil 1000 MG CAPS Take 1 capsule by mouth daily.    Yes Historical Provider, MD  hydrochlorothiazide (MICROZIDE) 12.5 MG capsule Take 1 capsule (12.5 mg total) by mouth daily. 03/08/12  Yes [name], MD  lidocaine-prilocaine (EMLA)  cream Apply topically as needed. Apply to phlebotomy site at least one hour prior to phlebotomy. 01/20/12  Yes Josph Macho, MD  lisinopril (PRINIVIL,ZESTRIL) 10 MG tablet Take 10 mg by mouth 2 (two) times daily.  06/29/11  Yes [name], MD  magnesium oxide (MAG-OX) 400 MG tablet Take 1,200 mg by mouth daily.     Yes Historical Provider, MD  oxyCODONE (OXYCONTIN) 10 MG 12 hr tablet Take 1 tablet (10 mg total) by mouth every 12 (twelve) hours. Brand name medically necessary. 09/27/12  Yes Josph Macho, MD  oxyCODONE (OXYCONTIN) 20 MG 12 hr tablet Take 1 tablet (20 mg total) by mouth every 8 (eight) hours. Brand name medically necessary. 09/27/12  Yes Josph Macho, MD  potassium chloride (K-DUR) 10 MEQ tablet Take 80 mEq by mouth daily.   Yes Historical Provider, MD  promethazine (PHENERGAN) 25 MG tablet Take 25 mg by mouth every 8 (eight) hours as needed for nausea.    Yes Historical Provider, MD  ROXICODONE 15 MG immediate release tablet Take 1 tablet (15 mg total) by mouth every 8 (eight) hours as needed for pain. Brand name medically necessary. 09/27/12 01/10/13 Yes Josph Macho, MD  Sennosides (SENNA LAX PO) Take 1 tablet by mouth at bedtime.     Yes Historical Provider, MD  temazepam (RESTORIL) 30 MG capsule Take 30 mg by mouth at bedtime as needed for sleep.   Yes Historical Provider, MD  thyroid (ARMOUR) 60 MG tablet Take 150 mg by mouth daily before breakfast.   Yes Historical Provider, MD  Valerian 100 MG CAPS Take 1 capsule by mouth daily.    Yes Historical Provider, MD   Allergies  Allergen Reactions  . Calcium Channel Blockers   . Codeine   . Penicillins   . Synthroid (Levothyroxine Sodium)     FAMILY HISTORY:  Family History  Problem Relation Age of Onset  . Heart attack Father   . Coronary artery disease Mother     had aortic valve replacement   SOCIAL HISTORY:  reports that she has never smoked. She does not have any smokeless tobacco history on file. She  reports that she does not drink alcohol or use illicit drugs.  REVIEW OF SYSTEMS:  Per HPI - all other systems reviewed and were neg.   VITAL SIGNS: Temp:  [98.4 F (36.9 C)-102.7 F (39.3 C)] 100.9 F (38.3 C) (07/07 0624) Pulse Rate:  [69-127] 100 (07/07 0932) Resp:  [16-21] 16 (07/07 0932) BP: (78-136)/(45-69) 78/45 mmHg (07/07 0932) SpO2:  [95 %-100 %] 100 % (07/07 0932) HEMODYNAMICS:   VENTILATOR SETTINGS:   INTAKE / OUTPUT: Intake/Output     07/06 0701 - 07/07 0700 07/07 0701 - 07/08 0700   P.O.     I.V.     Total Intake       Urine  Total Output       Net            Urine Occurrence 2 x      PHYSICAL EXAMINATION: General:  Chronically ill appearing female, NAD  Neuro:  Awake, alert, appropriate HEENT:  Mm dry, pale  Cardiovascular:  s1s2 distant Lungs:  resps even non labored on [place], diminished bases  Abdomen:  Distended, firm, tender diffusely, hypoactive BS Ext:  Warm and dry, scant BLE edema    LABS:  Recent Labs Lab 09/30/12 0039 09/30/12 0115  09/30/12 1220 09/30/12 1911 09/30/12 2334 10/01/12 0500 10/02/12 0505  HGB  --  8.2*  < > 6.1*  --   --  5.5* 5.5*  WBC  --   --   < > 10.0  --   --  21.7* 37.6*  PLT  --   --   < > 205  --   --  216 PLATELET CLUMPS NOTED ON SMEAR, COUNT APPEARS ADEQUATE  NA 135 137  --   --   --   --  134* 134*  K 3.3* 3.4*  --   --   --   --  3.3* 4.6  CL 95* 94*  --   --   --   --  98 99  CO2 31  --   --   --   --   --  30 24  GLUCOSE 123* 125*  --   --   --   --  123* 110*  BUN 9 9  --   --   --   --  8 13  CREATININE 0.82 1.00  --  0.76  --   --  0.76 1.02  CALCIUM 8.9  --   --   --   --   --  8.6 8.7  MG  --   --   --   --   --   --  1.8 2.1  AST 15  --   --   --   --   --  11 19  ALT 6  --   --   --   --   --  5 8  ALKPHOS 52  --   --   --   --   --  49 78  BILITOT 0.3  --   --   --   --   --  0.6 0.6  PROT 6.6  --   --   --   --   --  6.0 6.3  ALBUMIN 3.5  --   --   --   --   --  3.0* 3.0*  TROPONINI  --    --   --   --  <0.30 <0.30 <0.30  --   < > = values in this interval not displayed. No results found for this basename: GLUCAP,  in the last 168 hours  CXR:   Nm Hepatobiliary  10/01/2012   *RADIOLOGY REPORT*  Clinical Data: Gallstones, possible acute cholecystitis  NUCLEAR MEDICINE HEPATOHBILIARY INCLUDE GB  Radiopharmaceutical:  5.5 mCi 73m technetium Choletec  Comparison: CT abdomen/pelvis 09/30/2012  Findings: Normal hepatic parenchymal uptake of the radiotracer with excretion into the intrahepatic and common bile duct.  Radiotracer enters the small bowel progresses distally.  At 60 minutes, there was no radiotracer within the gallbladder lumen.  Therefore, 3 mg of morphine was administered intravenously.  Continued nonvisualization of the gallbladder at 30 minutes post morphine administration.  IMPRESSION:  Nonvisualization of the gallbladder at 60 minutes and also 30 minutes post morphine challenge.  Findings are consistent with obstruction of the cystic duct as can be seen in acute cholecystitis.  These results were called by telephone on 10/01/2012 at 3:20 p.m. to Dr. Lindie Spruce, who verbally acknowledged these results.   Original Report Authenticated By: Malachy Moan, M.D.    ASSESSMENT / PLAN:  PULMONARY Dyspnea - r/t sepsis, abd distension  P:  O2 as needed  pulm hygiene  F/u CXR   CARDIOVASCULAR Hypotension  Shock - septic P:  Volume resuscitation  Hold Ace, bisoprolol  Likely need CVL for CVP/pressors - refusing at this time, will re-eval after fluids  Check lactate, pct  Tx ICU  Will hold on sepsis protocol with no CVP for fluid guidance, ensure all other labs done   RENAL A: No acute issue  F/u chem  Hold Ace   GASTROINTESTINAL Cholelithiasis  Likely acute cholecystitis  - HIDA was + for cystic duct obstruction Abd pain  P:   Surgery following  Planned Perc choley drain 7/7  Pt refusing lap choley until 7/9 when Dr. Lindie Spruce available  HEMATOLOGIC Polycythemia  Vera - baseline Hgb 5-6, hyperviscosity.  Should not get above 7 per hematology (Ennever)  P:  F/u cbc  Monitor hgb closely if OR  INFECTIOUS Presumed acute cholecystitis  P:   F/u lactate, pct  abx as above  Source control - per drain per IR   ENDOCRINE Hx hypothyroid, allergy to synthroid  P:   Check TSH   NEUROLOGIC Chronic pain  anxiety P:  Cont xanax  Avoid oversedation  Hold dilaudid for now with significant hypotension  Had toradol x 1 this am    I have personally obtained a history, examined the patient, evaluated laboratory and imaging results, formulated the assessment and plan and placed orders. CRITICAL CARE: The patient is critically ill with multiple organ systems failure and requires high complexity decision making for assessment and support, frequent evaluation and titration of therapies, application of advanced monitoring technologies and extensive interpretation of multiple databases. Critical Care Time devoted to patient care services described in this note is 45 minutes.   Alyson Reedy, M.D. Pulmonary and Critical Care Medicine [facility] Pager: [phone]  10/02/2012, 11:11 AM

## 2012-10-03 ENCOUNTER — Ambulatory Visit: Payer: Medicare Other | Admitting: Internal Medicine

## 2012-10-03 DIAGNOSIS — A419 Sepsis, unspecified organism: Secondary | ICD-10-CM

## 2012-10-03 DIAGNOSIS — D649 Anemia, unspecified: Secondary | ICD-10-CM

## 2012-10-03 LAB — GLUCOSE, CAPILLARY
Glucose-Capillary: 93 mg/dL (ref 70–99)
Glucose-Capillary: 89 mg/dL (ref 70–99)
Glucose-Capillary: 141 mg/dL — ABNORMAL HIGH (ref 70–99)

## 2012-10-03 LAB — CBC WITH DIFFERENTIAL/PLATELET
Basophils Relative: 0 % (ref 0–1)
Eosinophils Absolute: 0 10*3/uL (ref 0.0–0.7)
Hemoglobin: 5.2 g/dL — CL (ref 12.0–15.0)
Lymphs Abs: 0.8 10*3/uL (ref 0.7–4.0)
MCH: 14.9 pg — ABNORMAL LOW (ref 26.0–34.0)
MCHC: 23.9 g/dL — ABNORMAL LOW (ref 30.0–36.0)
Monocytes Absolute: 1.4 10*3/uL — ABNORMAL HIGH (ref 0.1–1.0)
Neutrophils Relative %: 89 % — ABNORMAL HIGH (ref 43–77)
Platelets: 222 10*3/uL (ref 150–400)

## 2012-10-03 LAB — URINE CULTURE: Colony Count: NO GROWTH

## 2012-10-03 LAB — COMPREHENSIVE METABOLIC PANEL
Albumin: 2.7 g/dL — ABNORMAL LOW (ref 3.5–5.2)
Alkaline Phosphatase: 61 U/L (ref 39–117)
BUN: 15 mg/dL (ref 6–23)
Calcium: 8.6 mg/dL (ref 8.4–10.5)
Potassium: 3.9 mEq/L (ref 3.5–5.1)
Total Protein: 6 g/dL (ref 6.0–8.3)

## 2012-10-03 LAB — PROCALCITONIN: Procalcitonin: 1.86 ng/mL

## 2012-10-03 LAB — ABO/RH: ABO/RH(D): O POS

## 2012-10-03 LAB — MAGNESIUM: Magnesium: 2.3 mg/dL (ref 1.5–2.5)

## 2012-10-03 MED ORDER — THYROID 120 MG PO TABS
120.0000 mg | ORAL_TABLET | Freq: Every day | ORAL | Status: DC
  Administered 2012-10-04: 120 mg via ORAL
  Filled 2012-10-03 (×2): qty 1

## 2012-10-03 MED ORDER — TEMAZEPAM 15 MG PO CAPS
30.0000 mg | ORAL_CAPSULE | Freq: Every evening | ORAL | Status: DC | PRN
  Administered 2012-10-03: 30 mg via ORAL
  Filled 2012-10-03: qty 2

## 2012-10-03 MED ORDER — ACETAMINOPHEN 325 MG PO TABS
650.0000 mg | ORAL_TABLET | Freq: Once | ORAL | Status: AC
  Administered 2012-10-03: 650 mg via ORAL
  Filled 2012-10-03: qty 2

## 2012-10-03 MED ORDER — DIPHENHYDRAMINE HCL 25 MG PO CAPS
25.0000 mg | ORAL_CAPSULE | Freq: Once | ORAL | Status: AC
  Administered 2012-10-03: 25 mg via ORAL
  Filled 2012-10-03: qty 1

## 2012-10-03 NOTE — Progress Notes (Signed)
PULMONARY  / CRITICAL CARE MEDICINE  Name: [name] MRN: [id number] DOB: [birth year]    ADMISSION DATE:  09/30/2012 CONSULTATION DATE:  7/5  REFERRING MD :  Lindie Spruce PRIMARY SERVICE: Surgery  BRIEF PATIENT DESCRIPTION: 67yo female with hx polycythemia vera, admitted 7/5 with epigastric abd pain.  Found to have cholelithiases.  Was being medically managed while waiting for surgery off plavix.  On 7/7 developed hypotension, worsening abd pain, likely sepsis and PCCM consulted.   SIGNIFICANT EVENTS / STUDIES:  Perc Cholecystostomy 7/7 - cultures sent  LINES / TUBES: Refusing CVL  CULTURES: Blood cultures 7/7>>> NGTD Urine 7/7>>> No growth final Perc Cholecystostomy 7/7 - cultures sent  ANTIBIOTICS: Cipro 7/6>>> Flagyl 7/6>>>  HISTORY OF PRESENT ILLNESS:  67yo female with hx polycythemia vera, admitted 7/5 with epigastric abd pain.  Found to have significant cholelithiasis and likely acute cholecystitis.  Developed sepsis picture and PCCM consulted.  Pt/husband only want Dr. Lindie Spruce to do surgery which he is not available for until 7/9.  Currently with high fevers, leukocytosis, abd pain/distension, hypotension.  Mild SOB.  Denies chest pain.    VITAL SIGNS: Temp:  [97.9 F (36.6 C)-102.8 F (39.3 C)] 97.9 F (36.6 C) (07/08 0813) Pulse Rate:  [63-123] 102 (07/08 0900) Resp:  [14-31] 19 (07/08 0900) BP: (78-143)/(45-76) 121/52 mmHg (07/08 0900) SpO2:  [87 %-100 %] 100 % (07/08 0900) HEMODYNAMICS:   VENTILATOR SETTINGS:   INTAKE / OUTPUT: Intake/Output     07/07 0701 - 07/08 0700 07/08 0701 - 07/09 0700   I.V. 2091.7 250   IV Piggyback 1400    Total Intake 3491.7 250   Urine 1150    Drains 220    Total Output 1370     Net +2121.7 +250        Urine Occurrence 2 x     PHYSICAL EXAMINATION: General:  Chronically ill appearing female, NAD  Neuro:  Awake, alert, appropriate HEENT:  Mm dry, pale  Cardiovascular:  s1s2 distant Lungs:  resps even non labored on [place],  diminished bases  Abdomen:  Distended, firm, tender diffusely, hypoactive BS Ext:  Warm and dry, scant BLE edema    LABS:  Recent Labs Lab 09/30/12 1911 09/30/12 2334 10/01/12 0500 10/02/12 0505 10/02/12 1021 10/02/12 1451 10/03/12 0400  HGB  --   --  5.5* 5.5*  --   --  5.2*  WBC  --   --  21.7* 37.6*  --   --  19.9*  PLT  --   --  216 PLATELET CLUMPS NOTED ON SMEAR, COUNT APPEARS ADEQUATE  --   --  222  NA  --   --  134* 134*  --   --  135  K  --   --  3.3* 4.6  --   --  3.9  CL  --   --  98 99  --   --  102  CO2  --   --  30 24  --   --  24  GLUCOSE  --   --  123* 110*  --   --  127*  BUN  --   --  8 13  --   --  15  CREATININE  --   --  0.76 1.02  --   --  0.80  CALCIUM  --   --  8.6 8.7  --   --  8.6  MG  --   --  1.8 2.1  --   --  2.3  AST  --   --  11 19  --   --  13  ALT  --   --  5 8  --   --  9  ALKPHOS  --   --  49 78  --   --  61  BILITOT  --   --  0.6 0.6  --   --  0.4  PROT  --   --  6.0 6.3  --   --  6.0  ALBUMIN  --   --  3.0* 3.0*  --   --  2.7*  APTT  --   --   --   --  42*  --   --   INR  --   --   --   --  1.54*  --   --   LATICACIDVEN  --   --   --   --   --  1.0  --   TROPONINI <0.30 <0.30 <0.30  --   --   --   --   PROCALCITON  --   --   --   --   --  1.56 1.86    Recent Labs Lab 10/02/12 1403 10/02/12 1452 10/02/12 1950 10/03/12 0329 10/03/12 0811  GLUCAP 63* 135* 89 93 89    CXR (7/7) - Cardiomegaly, Low lung volumes  ASSESSMENT / PLAN:  PULMONARY Dyspnea - r/t sepsis, abd distension  P:  Supplemental Oxygen as needed  CARDIOVASCULAR Hypotension  Shock - septic P:  Patient has improved with volume resuscitation.  MAP >65. Will continue IV fluids and monitor closely.  RENAL A: No acute issue  Daily BMP Holding ACEI   GASTROINTESTINAL Cholelithiasis, acute cholecystitis  - HIDA was + for cystic duct obstruction Abd pain  P:   Surgery following  Perc drain done 7/7; awaiting cultures Patient to be transferred to  med-surg per surgery. Drain to stay in for 6-8 weeks then re-evaluation for cholecystecomy  HEMATOLOGIC 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  Should not get above 7 per hematology (Ennever)  P:  Daily CBC  INFECTIOUS Presumed acute cholecystitis  P:   Continuing Cipro; Surgery D/C'd flagyl Awaiting culture results  ENDOCRINE Hx hypothyroid, allergy to synthroid  P:   TSH - 0.021 Continue Thyroid Armour  NEUROLOGIC Chronic pain  anxiety P:  Cont xanax   TODAY'S SUMMARY: Transfer to med-surg, continue IV fluids and Cipro  [name] Other DO Family Medicine PGY-2  Will transfer to [facility] under surgery's service, PCCM will sign off, please call back if needed.  Patient seen and examined, agree with above note.  I dictated the care and orders written for this patient under my direction.  Alyson Reedy, MD [phone]

## 2012-10-03 NOTE — Progress Notes (Signed)
Agree with above, I also discussed miralax today.  Discussed plan with Dr [name] also and reiterated to patient this was plan.

## 2012-10-03 NOTE — Progress Notes (Signed)
The events of yesterday have been noted. It appears that she was on the verge of developing sepsis if not had developed this from her gallbladder. She underwent a cholecystotomy. Hopefully, her gallbladder will be taken out in a day or so.  Her blood pressure is better. She is now in the ICU. She still having right upper quadrant pain. She's not eating much.  Given that there is some sepsis, I suspect that she likely has some hemolysis. I believe that we probably will need to transfuse her with one unit of blood. I spoke to her about this. I explained to her why I felt this would be necessary. I think it unit of blood would help stabilize her, and would make things easier for her having surgery.  Her hemoglobin is 5.2 today. White cell  count 19.9. Platelet count 222. Her electrolytes looked okay.  She is on IV antibiotics.  I'm not sure when the surgery will be for gallbladder. This clearly needs to come out. Hopefully this we done today or tomorrow. I suspect that tomorrow probably will be when surgery is done.  She is afebrile. Blood pressure 111/57. Heart rate 102.  Lungs are clear. Cardiac exam tachycardic but regular. Abdomen is soft. She has a cholecystectomy tube in the right upper quadrant. Bowel sounds are somewhat decreased. Extremities shows no clubbing cyanosis or edema. Skin exam no rashes.  Again, I will transfuse her one unit of blood. Again I believe with her "sepsis" and infected gallbladder, she has a higher risk of hemolysis and this could compromise her cardiovascular status. I reassured her that she be monitored and that one unit of blood would not "thickening" her blood.  I appreciate a great care that she is receiving.  [name] 14:1

## 2012-10-03 NOTE — Progress Notes (Signed)
Subjective: Patient with sepsis, cholecystitis s/p chole drain 7/7. She states she is feeling better and less pain around incision site. Drain with current output.  Objective: Vital signs in last 24 hours: Temp:  [97.9 F (36.6 C)-102.8 F (39.3 C)] 98 F (36.7 C) (07/08 1215) Pulse Rate:  [63-123] 98 (07/08 1300) Resp:  [14-31] 20 (07/08 1300) BP: (94-143)/(48-76) 118/62 mmHg (07/08 1300) SpO2:  [87 %-100 %] 95 % (07/08 1300) Last BM Date: 09/28/12  Intake/Output from previous day: 07/07 0701 - 07/08 0700 In: 3491.7 [I.V.:2091.7; IV Piggyback:1400] Out: 1370 [Urine:1150; Drains:220] Intake/Output this shift: Total I/O In: 1012.5 [I.V.:1000; Blood:12.5] Out: -   PE: Temp: 97.9 VSS  WBC 19.9 (37.6 on 7/7) Output 200cc (220 cc on 7/7) Drain C/D/I with bilious clear fluid and purulent blood tinged strands within.  Lab Results:   Recent Labs  10/02/12 0505 10/03/12 0400  WBC 37.6* 19.9*  HGB 5.5* 5.2*  HCT 22.6* 21.8*  PLT PLATELET CLUMPS NOTED ON SMEAR, COUNT APPEARS ADEQUATE 222   BMET  Recent Labs  10/02/12 0505 10/03/12 0400  NA 134* 135  K 4.6 3.9  CL 99 102  CO2 24 24  GLUCOSE 110* 127*  BUN 13 15  CREATININE 1.02 0.80  CALCIUM 8.7 8.6   PT/INR  Recent Labs  10/02/12 1021  LABPROT 18.1*  INR 1.54*   ABG No results found for this basename: PHART, PCO2, PO2, HCO3,  in the last 72 hours  Studies/Results: Nm Hepatobiliary  10/01/2012   *RADIOLOGY REPORT*  Clinical Data: Gallstones, possible acute cholecystitis  NUCLEAR MEDICINE HEPATOHBILIARY INCLUDE GB  Radiopharmaceutical:  5.5 mCi 26m technetium Choletec  Comparison: CT abdomen/pelvis 09/30/2012  Findings: Normal hepatic parenchymal uptake of the radiotracer with excretion into the intrahepatic and common bile duct.  Radiotracer enters the small bowel progresses distally.  At 60 minutes, there was no radiotracer within the gallbladder lumen.  Therefore, 3 mg of morphine was administered  intravenously.  Continued nonvisualization of the gallbladder at 30 minutes post morphine administration.  IMPRESSION:  Nonvisualization of the gallbladder at 60 minutes and also 30 minutes post morphine challenge.  Findings are consistent with obstruction of the cystic duct as can be seen in acute cholecystitis.  These results were called by telephone on 10/01/2012 at 3:20 p.m. to Dr. Lindie Spruce, who verbally acknowledged these results.   Original Report Authenticated By: Malachy Moan, M.D.   Ir Perc Cholecystostomy  10/02/2012   *RADIOLOGY REPORT*  Clinical Data: Acute calculus cholecystitis, nonoperative candidate  ULTRASOUND FLUOROSCOPIC PERCUTANEOUS TRANSHEPATIC CHOLECYSTOSTOMY  Date:  10/02/2012 12:00:00  Radiologist:  M. Ruel Favors, M.D.  Medications:  2 mg Versed, 150 mcg Fentanyl, 50 mg Demerol, the patient is already receiving antibiotics.  Guidance:  Ultrasound fluoroscopic  Fluoroscopy time:  42 seconds  Sedation time:  40 minutes  Contrast volume:  None.  Complications:  No immediate  PROCEDURE/FINDINGS:  Informed consent was obtained from the patient following explanation of the procedure, risks, benefits and alternatives. The patient understands, agrees and consents for the procedure. All questions were addressed.  A time out was performed.  Maximal barrier sterile technique utilized including caps, mask, sterile gowns, sterile gloves, large sterile drape, hand hygiene, and betadine  Previous imaging reviewed.  Preliminary ultrasound performed in the right upper quadrant.  The distended gallbladder containing gallstones was localized.  Under sterile conditions and local anesthesia, a 21 gauge 15 cm access needle was advanced under direct ultrasound through a transhepatic window into the gallbladder.  Needle  position confirmed with ultrasound.  There was return of bile.  Guide wire advanced.  Transitional dilator inserted.  Guide wire exchanged for an Amplatz guide wire.  Tract dilatation performed to  insert a 10-French drain catheter. Retention loop formed in the gallbladder.  Syringe aspiration yielded 40 ml exudative bile.  Sample sent for Gram stain and culture.  Catheter secured with a Prolene suture and connected to external gravity drainage.  Sterile dressing applied.  No immediate complication.  IMPRESSION: Successful ultrasound and fluoroscopic 10-French cholecystostomy   Original Report Authenticated By: Judie Petit. Shick, M.D.   Dg Chest Portable 1 View  10/02/2012   *RADIOLOGY REPORT*  Clinical Data: Dyspnea.  PORTABLE CHEST - 1 VIEW  Comparison: One-view chest 09/30/2012.  Findings: The heart to size is exaggerated by low lung volumes. Mild pulmonary vascular congestion is present.  Minimal bibasilar atelectasis is noted. The visualized soft tissues and bony thorax are unremarkable.  IMPRESSION:  1.  Borderline cardiomegaly and mild pulmonary vascular congestion, suggesting early congestive heart failure. 2.  Minimal bibasilar atelectasis is associated with low lung volumes.   Original Report Authenticated By: Marin Roberts, M.D.    Anti-infectives: Anti-infectives   Start     Dose/Rate Route Frequency Ordered Stop   10/01/12 0815  metroNIDAZOLE (FLAGYL) IVPB 500 mg  Status:  Discontinued     500 mg 100 mL/hr over 60 Minutes Intravenous Every 8 hours 10/01/12 0810 10/03/12 0816   09/30/12 1200  ciprofloxacin (CIPRO) IVPB 400 mg     400 mg 200 mL/hr over 60 Minutes Intravenous Every 12 hours 09/30/12 1057        Assessment/Plan: Sepsis with cholecystitis s/p chole drain on 7/7. Drain intact with bilious output. WBC trending down and patient is afeb. Will continue to follow.  Plan per [facility]  LOS: 3 days    Kaisen Ackers A 10/03/2012

## 2012-10-03 NOTE — Progress Notes (Signed)
UR Completed.  [name] [name] [phone] 10/03/2012  

## 2012-10-03 NOTE — Progress Notes (Signed)
Patient ID: [name], female   DOB: 15-Dec-1945, 67 y.o.   MRN: [id number]    Subjective: Pt feels some better today.  Was confused about surgery being today or tomorrow after her drain was placed yesterday.  I had a long d/w her about this.  I explained to her that she would keep the drain for 6-8 weeks and then consider surgery at that time.  She had no nausea and is passing some flatus.  Abdominal pain is better than yesterday.  Objective: Vital signs in last 24 hours: Temp:  [97.9 F (36.6 C)-102.8 F (39.3 C)] 97.9 F (36.6 C) (07/08 0813) Pulse Rate:  [63-123] 100 (07/08 0800) Resp:  [14-31] 24 (07/08 0800) BP: (78-143)/(45-76) 121/67 mmHg (07/08 0800) SpO2:  [87 %-100 %] 100 % (07/08 0800) Last BM Date: 09/28/12  Intake/Output from previous day: 07/07 0701 - 07/08 0700 In: 3491.7 [I.V.:2091.7; IV Piggyback:1400] Out: 1370 [Urine:1150; Drains:220] Intake/Output this shift:    PE: Abd: soft, some distention, hypoactive BS, tender in LLQ, periumbilical, minimal in RUQ, drain in place with minimal bile currently. Heart: regular Lungs: CTAB  Lab Results:   Recent Labs  10/02/12 0505 10/03/12 0400  WBC 37.6* 19.9*  HGB 5.5* 5.2*  HCT 22.6* 21.8*  PLT PLATELET CLUMPS NOTED ON SMEAR, COUNT APPEARS ADEQUATE 222   BMET  Recent Labs  10/02/12 0505 10/03/12 0400  NA 134* 135  K 4.6 3.9  CL 99 102  CO2 24 24  GLUCOSE 110* 127*  BUN 13 15  CREATININE 1.02 0.80  CALCIUM 8.7 8.6   PT/INR  Recent Labs  10/02/12 1021  LABPROT 18.1*  INR 1.54*   CMP     Component Value Date/Time   NA 135 10/03/2012 0400   K 3.9 10/03/2012 0400   CL 102 10/03/2012 0400   CO2 24 10/03/2012 0400   GLUCOSE 127* 10/03/2012 0400   BUN 15 10/03/2012 0400   CREATININE 0.80 10/03/2012 0400   CALCIUM 8.6 10/03/2012 0400   PROT 6.0 10/03/2012 0400   ALBUMIN 2.7* 10/03/2012 0400   AST 13 10/03/2012 0400   ALT 9 10/03/2012 0400   ALKPHOS 61 10/03/2012 0400   BILITOT 0.4 10/03/2012 0400   GFRNONAA 74*  10/03/2012 0400   GFRAA 85* 10/03/2012 0400   Lipase     Component Value Date/Time   LIPASE 23 09/30/2012 0039       Studies/Results: Nm Hepatobiliary  10/01/2012   *RADIOLOGY REPORT*  Clinical Data: Gallstones, possible acute cholecystitis  NUCLEAR MEDICINE HEPATOHBILIARY INCLUDE GB  Radiopharmaceutical:  5.5 mCi 9m technetium Choletec  Comparison: CT abdomen/pelvis 09/30/2012  Findings: Normal hepatic parenchymal uptake of the radiotracer with excretion into the intrahepatic and common bile duct.  Radiotracer enters the small bowel progresses distally.  At 60 minutes, there was no radiotracer within the gallbladder lumen.  Therefore, 3 mg of morphine was administered intravenously.  Continued nonvisualization of the gallbladder at 30 minutes post morphine administration.  IMPRESSION:  Nonvisualization of the gallbladder at 60 minutes and also 30 minutes post morphine challenge.  Findings are consistent with obstruction of the cystic duct as can be seen in acute cholecystitis.  These results were called by telephone on 10/01/2012 at 3:20 p.m. to Dr. Lindie Spruce, who verbally acknowledged these results.   Original Report Authenticated By: Malachy Moan, M.D.   Ir Perc Cholecystostomy  10/02/2012   *RADIOLOGY REPORT*  Clinical Data: Acute calculus cholecystitis, nonoperative candidate  ULTRASOUND FLUOROSCOPIC PERCUTANEOUS TRANSHEPATIC CHOLECYSTOSTOMY  Date:  10/02/2012  12:00:00  Radiologist:  Judie Petit. Ruel Favors, M.D.  Medications:  2 mg Versed, 150 mcg Fentanyl, 50 mg Demerol, the patient is already receiving antibiotics.  Guidance:  Ultrasound fluoroscopic  Fluoroscopy time:  42 seconds  Sedation time:  40 minutes  Contrast volume:  None.  Complications:  No immediate  PROCEDURE/FINDINGS:  Informed consent was obtained from the patient following explanation of the procedure, risks, benefits and alternatives. The patient understands, agrees and consents for the procedure. All questions were addressed.  A time out  was performed.  Maximal barrier sterile technique utilized including caps, mask, sterile gowns, sterile gloves, large sterile drape, hand hygiene, and betadine  Previous imaging reviewed.  Preliminary ultrasound performed in the right upper quadrant.  The distended gallbladder containing gallstones was localized.  Under sterile conditions and local anesthesia, a 21 gauge 15 cm access needle was advanced under direct ultrasound through a transhepatic window into the gallbladder.  Needle position confirmed with ultrasound.  There was return of bile.  Guide wire advanced.  Transitional dilator inserted.  Guide wire exchanged for an Amplatz guide wire.  Tract dilatation performed to insert a 10-French drain catheter. Retention loop formed in the gallbladder.  Syringe aspiration yielded 40 ml exudative bile.  Sample sent for Gram stain and culture.  Catheter secured with a Prolene suture and connected to external gravity drainage.  Sterile dressing applied.  No immediate complication.  IMPRESSION: Successful ultrasound and fluoroscopic 10-French cholecystostomy   Original Report Authenticated By: Judie Petit. Shick, M.D.   Dg Chest Portable 1 View  10/02/2012   *RADIOLOGY REPORT*  Clinical Data: Dyspnea.  PORTABLE CHEST - 1 VIEW  Comparison: One-view chest 09/30/2012.  Findings: The heart to size is exaggerated by low lung volumes. Mild pulmonary vascular congestion is present.  Minimal bibasilar atelectasis is noted. The visualized soft tissues and bony thorax are unremarkable.  IMPRESSION:  1.  Borderline cardiomegaly and mild pulmonary vascular congestion, suggesting early congestive heart failure. 2.  Minimal bibasilar atelectasis is associated with low lung volumes.   Original Report Authenticated By: Marin Roberts, M.D.    Anti-infectives: Anti-infectives   Start     Dose/Rate Route Frequency Ordered Stop   10/01/12 0815  metroNIDAZOLE (FLAGYL) IVPB 500 mg  Status:  Discontinued     500 mg 100 mL/hr over 60  Minutes Intravenous Every 8 hours 10/01/12 0810 10/03/12 0816   09/30/12 1200  ciprofloxacin (CIPRO) IVPB 400 mg     400 mg 200 mL/hr over 60 Minutes Intravenous Every 12 hours 09/30/12 1057         Assessment/Plan  1. Cholecystitis 2. Sepsis 3. PCV 4. Anemia, secondary to phlebotomy 5. Fibromyalgia   Plan: 1. Patient appears much better today.  WBC is down to 19K from 37K yesterday.  Her BP is much better today. 2. Will transfer her back out to the floor. 3. Dc Flagyl as this is not indicated right now for her gallbladder.  Her urine culture is negative at this time. 4. BC are pending still 5. Explained to the patient thoroughly again today, that she will not be getting an operation this admission.  She will need to keep her drain for 6-8 weeks and then reconsider surgery at that time.   6. Defer to Dr. [name] on need for transfusion at this time.   7. Will start clear liquids today 8. Patient needs to get up and mobilize. 9. Appreciate medicine and CCM assistance with this patient.  LOS: 3 days  Yitzchok Carriger E 10/03/2012, 8:20 AM Pager: [phone]

## 2012-10-04 ENCOUNTER — Inpatient Hospital Stay (HOSPITAL_COMMUNITY): Payer: Medicare Other

## 2012-10-04 DIAGNOSIS — R0602 Shortness of breath: Secondary | ICD-10-CM

## 2012-10-04 DIAGNOSIS — I6350 Cerebral infarction due to unspecified occlusion or stenosis of unspecified cerebral artery: Secondary | ICD-10-CM

## 2012-10-04 DIAGNOSIS — A419 Sepsis, unspecified organism: Secondary | ICD-10-CM

## 2012-10-04 DIAGNOSIS — I119 Hypertensive heart disease without heart failure: Secondary | ICD-10-CM

## 2012-10-04 DIAGNOSIS — K8020 Calculus of gallbladder without cholecystitis without obstruction: Secondary | ICD-10-CM

## 2012-10-04 DIAGNOSIS — I2510 Atherosclerotic heart disease of native coronary artery without angina pectoris: Secondary | ICD-10-CM

## 2012-10-04 DIAGNOSIS — I369 Nonrheumatic tricuspid valve disorder, unspecified: Secondary | ICD-10-CM

## 2012-10-04 DIAGNOSIS — R5381 Other malaise: Secondary | ICD-10-CM

## 2012-10-04 LAB — BASIC METABOLIC PANEL
BUN: 10 mg/dL (ref 6–23)
CO2: 24 mEq/L (ref 19–32)
Calcium: 8.2 mg/dL — ABNORMAL LOW (ref 8.4–10.5)
Creatinine, Ser: 0.65 mg/dL (ref 0.50–1.10)
GFR calc non Af Amer: 89 mL/min — ABNORMAL LOW (ref >90)
Glucose, Bld: 91 mg/dL (ref 70–99)
Sodium: 135 mEq/L (ref 135–145)

## 2012-10-04 LAB — COMPREHENSIVE METABOLIC PANEL
ALT: 9 U/L (ref 0–35)
AST: 13 U/L (ref 0–37)
Calcium: 8.2 mg/dL — ABNORMAL LOW (ref 8.4–10.5)
Sodium: 135 mEq/L (ref 135–145)
Total Protein: 5.6 g/dL — ABNORMAL LOW (ref 6.0–8.3)

## 2012-10-04 LAB — TYPE AND SCREEN
ABO/RH(D): O POS
Antibody Screen: NEGATIVE

## 2012-10-04 LAB — CBC
Hemoglobin: 6.2 g/dL — CL (ref 12.0–15.0)
MCH: 16.8 pg — ABNORMAL LOW (ref 26.0–34.0)
MCHC: 25.8 g/dL — ABNORMAL LOW (ref 30.0–36.0)
MCV: 64.9 fL — ABNORMAL LOW (ref 78.0–100.0)
RBC: 3.7 MIL/uL — ABNORMAL LOW (ref 3.87–5.11)

## 2012-10-04 LAB — GLUCOSE, CAPILLARY: Glucose-Capillary: 86 mg/dL (ref 70–99)

## 2012-10-04 MED ORDER — FUROSEMIDE 10 MG/ML IJ SOLN
40.0000 mg | Freq: Once | INTRAMUSCULAR | Status: AC
  Administered 2012-10-04: 40 mg via INTRAVENOUS
  Filled 2012-10-04 (×2): qty 4

## 2012-10-04 MED ORDER — CIPROFLOXACIN HCL 500 MG PO TABS
500.0000 mg | ORAL_TABLET | Freq: Two times a day (BID) | ORAL | Status: AC
  Administered 2012-10-04 – 2012-10-09 (×12): 500 mg via ORAL
  Filled 2012-10-04 (×17): qty 1

## 2012-10-04 MED ORDER — POTASSIUM CHLORIDE CRYS ER 20 MEQ PO TBCR
40.0000 meq | EXTENDED_RELEASE_TABLET | Freq: Two times a day (BID) | ORAL | Status: DC
  Administered 2012-10-04 – 2012-10-11 (×14): 40 meq via ORAL
  Filled 2012-10-04 (×18): qty 2

## 2012-10-04 MED ORDER — FUROSEMIDE 40 MG PO TABS
40.0000 mg | ORAL_TABLET | Freq: Two times a day (BID) | ORAL | Status: DC
Start: 2012-10-04 — End: 2012-10-04
  Administered 2012-10-04: 40 mg via ORAL
  Filled 2012-10-04: qty 1

## 2012-10-04 MED ORDER — CLOPIDOGREL BISULFATE 75 MG PO TABS: 75.0000 mg | ORAL_TABLET | Freq: Every day | ORAL | Status: DC

## 2012-10-04 MED ORDER — OXYCODONE HCL 20 MG PO TB12: 20.0000 mg | ORAL_TABLET | Freq: Three times a day (TID) | ORAL | Status: DC

## 2012-10-04 MED ORDER — FUROSEMIDE 80 MG PO TABS: 80.0000 mg | ORAL_TABLET | Freq: Every day | ORAL | Status: DC

## 2012-10-04 MED ORDER — BISOPROLOL FUMARATE 10 MG PO TABS
10.0000 mg | ORAL_TABLET | Freq: Every day | ORAL | Status: DC
  Administered 2012-10-04 – 2012-10-12 (×8): 10 mg via ORAL
  Filled 2012-10-04 (×9): qty 1

## 2012-10-04 MED ORDER — OXYCODONE HCL ER 10 MG PO T12A: 10.0000 mg | EXTENDED_RELEASE_TABLET | Freq: Two times a day (BID) | ORAL | Status: DC

## 2012-10-04 MED ORDER — TEMAZEPAM 15 MG PO CAPS
30.0000 mg | ORAL_CAPSULE | Freq: Every evening | ORAL | Status: DC | PRN
  Administered 2012-10-04: 30 mg via ORAL
  Filled 2012-10-04 (×2): qty 1

## 2012-10-04 MED ORDER — LISINOPRIL 10 MG PO TABS
10.0000 mg | ORAL_TABLET | Freq: Two times a day (BID) | ORAL | Status: DC
  Administered 2012-10-04: 10 mg via ORAL

## 2012-10-04 MED ORDER — OXYCODONE HCL ER 20 MG PO T12A
20.0000 mg | EXTENDED_RELEASE_TABLET | ORAL | Status: DC
  Administered 2012-10-04 – 2012-10-12 (×23): 20 mg via ORAL
  Filled 2012-10-04 (×12): qty 2
  Filled 2012-10-04: qty 1
  Filled 2012-10-04 (×2): qty 2
  Filled 2012-10-04: qty 1
  Filled 2012-10-04: qty 2
  Filled 2012-10-04: qty 1
  Filled 2012-10-04 (×6): qty 2

## 2012-10-04 MED ORDER — HYDROCHLOROTHIAZIDE 12.5 MG PO CAPS
12.5000 mg | ORAL_CAPSULE | Freq: Every day | ORAL | Status: DC
  Administered 2012-10-04: 12.5 mg via ORAL

## 2012-10-04 MED ORDER — OXYCODONE HCL ER 10 MG PO T12A: 20.0000 mg | EXTENDED_RELEASE_TABLET | Freq: Three times a day (TID) | ORAL | Status: DC

## 2012-10-04 MED ORDER — THYROID 30 MG PO TABS
150.0000 mg | ORAL_TABLET | Freq: Every day | ORAL | Status: DC
  Administered 2012-10-05 – 2012-10-12 (×7): 150 mg via ORAL
  Filled 2012-10-04 (×11): qty 1

## 2012-10-04 MED ORDER — OXYCODONE HCL 10 MG PO TB12: 10.0000 mg | ORAL_TABLET | Freq: Two times a day (BID) | ORAL | Status: DC

## 2012-10-04 MED ORDER — POTASSIUM CHLORIDE ER 10 MEQ PO TBCR: 80.0000 meq | EXTENDED_RELEASE_TABLET | Freq: Every day | ORAL | Status: DC

## 2012-10-04 MED ORDER — FUROSEMIDE 40 MG PO TABS
40.0000 mg | ORAL_TABLET | Freq: Every day | ORAL | Status: AC
  Administered 2012-10-05: 40 mg via ORAL
  Filled 2012-10-04: qty 1

## 2012-10-04 MED ORDER — FUROSEMIDE 10 MG/ML IJ SOLN: 20.0000 mg | Freq: Once | INTRAMUSCULAR | Status: DC

## 2012-10-04 MED ORDER — LISINOPRIL 10 MG PO TABS
10.0000 mg | ORAL_TABLET | Freq: Every day | ORAL | Status: DC
  Administered 2012-10-05 – 2012-10-06 (×2): 10 mg via ORAL
  Filled 2012-10-04 (×3): qty 1

## 2012-10-04 NOTE — Progress Notes (Addendum)
[facility] PROGRESS NOTE  [name] ZOX:[id number] DOB: 1945/09/13 DOA: 09/30/2012 PCP: [name], MD  Assessment/Plan: Principal Problem:   Benign hypertensive heart disease without heart failure Active Problems:   Hypothyroidism   Polycythemia vera(238.4)   Fibromyalgia   CAD (coronary artery disease)   Cholelithiases   Unspecified constipation   Septic shock   Please note that this is not a new consult, Dr. Thedore Mins was following the patient up until 07/07  Patient tachypneic,SOB ,PUFFY  States she has repeatedly requested to see Dr Patty Sermons, doesn't know why she keeps seeing other providers     Cholelithiasis-acute cholecystitis, patient had a percutaneous drain placed on 7/7, WBC has trended down, status post HIDA scan with cystic duct obstruction, surgery following, Drain to stay in for 6-8 weeks then re-evaluation for cholecystecomy   Shortness of breath Unlikely to be pulmonary embolism, CT and negative on 7/5 Patient has been on DVT prophylaxis  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 Should not get above 7 per hematology (Ennever, transfused one unit by Dr. Twanna Hy on 7/8, hemoglobin up to 6.2   Hypertension, HCTZ/lisinopril/Lasix resumed-discontinue HCTZ and decrease lisinopril dosing, patient may be tachycardic because of low-grade fever,  rebound tachycardia, bisoprolol resumed, no definite pulmonary vascular congestion on chest x-ray therefore lasix iv    CAD   Pt's Cardiologist note fm 7/2/2012She does not have a history of known ischemic heart disease. She had an echocardiogram in 12/04/09 which showed normal left ventricular systolic function with an ejection fraction of 55-60% and normal diastolic function and mild aortic sclerosis. Trop -ve x 3, repeat enzymes, tele, troponin  Hypothyroidism; per patient and husband unable to take Synthroid. On Armour Thyroid 120 mg daily (patient has home  meds      HPI/Subjective: Complaining of moderate shortness of breath  Objective: Filed Vitals:   10/03/12 2204 10/04/12 0132 10/04/12 0431 10/04/12 0948  BP: 139/73 111/58 145/82 142/74  Pulse: 100 108 107 114  Temp: 99 F (37.2 C) 97.3 F (36.3 C) 99.3 F (37.4 C) 99.4 F (37.4 C)  TempSrc:  Oral Oral Oral  Resp: 20 20 18 18   SpO2: 96% 100% 98% 94%    Intake/Output Summary (Last 24 hours) at 10/04/12 1225 Last data filed at 10/04/12 1055  Gross per 24 hour  Intake   2935 ml  Output    250 ml  Net   2685 ml    Exam:  HENT:  Head: Atraumatic.  Nose: Nose normal.  Mouth/Throat: Oropharynx is clear and moist.  Eyes: Conjunctivae are normal. Pupils are equal, round, and reactive to light. No scleral icterus.  Neck: Neck supple. No tracheal deviation present.  Cardiovascular: Normal rate, regular rhythm, normal heart sounds and intact distal pulses.  Pulmonary/Chest: Effort normal and breath sounds normal. No respiratory distress.  Abdominal: Soft. Normal appearance and bowel sounds are normal. She exhibits no distension. There is no tenderness.  Musculoskeletal: She exhibits no edema and no tenderness.  Neurological: She is alert. No cranial nerve deficit.    Data Reviewed: Basic Metabolic Panel:  Recent Labs Lab 09/30/12 0039 09/30/12 0115 09/30/12 1220 10/01/12 0500 10/02/12 0505 10/03/12 0400 10/04/12 0610  NA 135 137  --  134* 134* 135 135  K 3.3* 3.4*  --  3.3* 4.6 3.9 4.0  CL 95* 94*  --  98 99 102 104  CO2 31  --   --  30 24 24 24   GLUCOSE 123* 125*  --  123* 110*  127* 91  BUN 9 9  --  8 13 15 10   CREATININE 0.82 1.00 0.76 0.76 1.02 0.80 0.65  CALCIUM 8.9  --   --  8.6 8.7 8.6 8.2*  MG  --   --   --  1.8 2.1 2.3  --     Liver Function Tests:  Recent Labs Lab 09/30/12 0039 10/01/12 0500 10/02/12 0505 10/03/12 0400  AST 15 11 19 13   ALT 6 5 8 9   ALKPHOS 52 49 78 61  BILITOT 0.3 0.6 0.6 0.4  PROT 6.6 6.0 6.3 6.0  ALBUMIN 3.5 3.0*  3.0* 2.7*    Recent Labs Lab 09/30/12 0039  LIPASE 23   No results found for this basename: AMMONIA,  in the last 168 hours  CBC:  Recent Labs Lab 09/27/12 1303  09/30/12 0535 09/30/12 1220 10/01/12 0500 10/02/12 0505 10/03/12 0400 10/04/12 0610  WBC 4.0  < > 7.4 10.0 21.7* 37.6* 19.9* 10.1  NEUTROABS 2.3  --  6.6  --  19.6* 33.8* 17.7*  --   HGB 5.7*  < > 5.7* 6.1* 5.5* 5.5* 5.2* 6.2*  HCT 24.2*  < > 23.1* 24.9* 22.4* 22.6* 21.8* 24.0*  MCV 64*  < > 61.6* 61.3* 61.4* 62.8* 62.6* 64.9*  PLT 225 Large platelets present  < > 202 205 216 PLATELET CLUMPS NOTED ON SMEAR, COUNT APPEARS ADEQUATE 222 181  < > = values in this interval not displayed.  Cardiac Enzymes:  Recent Labs Lab 09/30/12 1911 09/30/12 2334 10/01/12 0500  TROPONINI <0.30 <0.30 <0.30   BNP (last 3 results) No results found for this basename: PROBNP,  in the last 8760 hours   CBG:  Recent Labs Lab 10/03/12 0811 10/03/12 1129 10/03/12 1554 10/04/12 0009 10/04/12 0804  GLUCAP 89 141* 93 101* 86    Recent Results (from the past 240 hour(s))  URINE CULTURE     Status: None   Collection Time    10/01/12  9:04 PM      Result Value Range Status   Specimen Description URINE, CLEAN CATCH   Final   Special Requests NONE   Final   Culture  Setup Time 10/02/2012 02:48   Final   Colony Count NO GROWTH   Final   Culture NO GROWTH   Final   Report Status 10/03/2012 FINAL   Final  SURGICAL PCR SCREEN     Status: None   Collection Time    10/01/12 11:28 PM      Result Value Range Status   MRSA, PCR NEGATIVE  NEGATIVE Final   Staphylococcus aureus NEGATIVE  NEGATIVE Final   Comment:            The Xpert SA Assay (FDA     approved for NASAL specimens     in patients over 64 years of age),     is one component of     a comprehensive surveillance     program.  Test performance has     been validated by [employer] for patients greater     than or equal to 14 year old.     It is not intended      to diagnose infection nor to     guide or monitor treatment.  CULTURE, ROUTINE-ABSCESS     Status: None   Collection Time    10/02/12  1:55 PM      Result Value Range Status   Specimen Description ABSCESS GALL  BLADDER   Final   Special Requests NONE   Final   Gram Stain     Final   Value: NO WBC SEEN     NO SQUAMOUS EPITHELIAL CELLS SEEN     MODERATE GRAM POSITIVE RODS     MODERATE GRAM POSITIVE COCCI     IN PAIRS FEW GRAM NEGATIVE RODS   Culture MULTIPLE ORGANISMS PRESENT, NONE PREDOMINANT   Final   Report Status PENDING   Incomplete  ANAEROBIC CULTURE     Status: None   Collection Time    10/02/12  1:55 PM      Result Value Range Status   Specimen Description ABSCESS GALL BLADDER   Final   Special Requests NONE   Final   Gram Stain     Final   Value: NO WBC SEEN     NO SQUAMOUS EPITHELIAL CELLS SEEN     MODERATE GRAM POSITIVE RODS     MODERATE GRAM POSITIVE COCCI     IN PAIRS FEW GRAM NEGATIVE RODS   Culture     Final   Value: NO ANAEROBES ISOLATED; CULTURE IN PROGRESS FOR 5 DAYS   Report Status PENDING   Incomplete  MRSA PCR SCREENING     Status: None   Collection Time    10/02/12  2:09 PM      Result Value Range Status   MRSA by PCR NEGATIVE  NEGATIVE Final   Comment:            The GeneXpert MRSA Assay (FDA     approved for NASAL specimens     only), is one component of a     comprehensive MRSA colonization     surveillance program. It is not     intended to diagnose MRSA     infection nor to guide or     monitor treatment for     MRSA infections.  CULTURE, BLOOD (ROUTINE X 2)     Status: None   Collection Time    10/02/12  2:30 PM      Result Value Range Status   Specimen Description BLOOD RIGHT ARM   Final   Special Requests BOTTLES DRAWN AEROBIC AND ANAEROBIC 10CC   Final   Culture  Setup Time 10/02/2012 21:33   Final   Culture     Final   Value:        BLOOD CULTURE RECEIVED NO GROWTH TO DATE CULTURE WILL BE HELD FOR 5 DAYS BEFORE ISSUING A FINAL  NEGATIVE REPORT   Report Status PENDING   Incomplete  CULTURE, BLOOD (ROUTINE X 2)     Status: None   Collection Time    10/02/12  2:40 PM      Result Value Range Status   Specimen Description BLOOD RIGHT ARM   Final   Special Requests BOTTLES DRAWN AEROBIC AND ANAEROBIC 10CC   Final   Culture  Setup Time 10/02/2012 21:33   Final   Culture     Final   Value:        BLOOD CULTURE RECEIVED NO GROWTH TO DATE CULTURE WILL BE HELD FOR 5 DAYS BEFORE ISSUING A FINAL NEGATIVE REPORT   Report Status PENDING   Incomplete     Studies: Nm Hepatobiliary  10/01/2012   *RADIOLOGY REPORT*  Clinical Data: Gallstones, possible acute cholecystitis  NUCLEAR MEDICINE HEPATOHBILIARY INCLUDE GB  Radiopharmaceutical:  5.5 mCi 7m technetium Choletec  Comparison: CT abdomen/pelvis 09/30/2012  Findings: Normal hepatic parenchymal uptake of the  radiotracer with excretion into the intrahepatic and common bile duct.  Radiotracer enters the small bowel progresses distally.  At 60 minutes, there was no radiotracer within the gallbladder lumen.  Therefore, 3 mg of morphine was administered intravenously.  Continued nonvisualization of the gallbladder at 30 minutes post morphine administration.  IMPRESSION:  Nonvisualization of the gallbladder at 60 minutes and also 30 minutes post morphine challenge.  Findings are consistent with obstruction of the cystic duct as can be seen in acute cholecystitis.  These results were called by telephone on 10/01/2012 at 3:20 p.m. to Dr. Lindie Spruce, who verbally acknowledged these results.   Original Report Authenticated By: Malachy Moan, M.D.   US Abdomen Complete  09/30/2012   *RADIOLOGY REPORT*  Clinical Data:  Mid epigastric pain.   ABDOMINAL ULTRASOUND COMPLETE  Comparison:  None.  Findings:  Gallbladder:  Multiple stones are identified within the gallbladder measuring up to 8 mm.  No gallbladder wall thickening or pericholecystic fluid.  Negative sonographic Murphy's sign.  Common Bile Duct:   The common bile duct is increased in caliber measuring up to 8.2 mm.  Liver: No focal mass lesion identified.  Within normal limits in parenchymal echogenicity.  IVC:  Appears normal.  Pancreas:  No abnormality identified.  Spleen:  Within normal limits in size and echotexture.  Right kidney:  Normal in size and parenchymal echogenicity.  No evidence of mass or hydronephrosis.  Left kidney:  Normal in size and parenchymal echogenicity.  No evidence of mass or hydronephrosis.  Abdominal Aorta:  No aneurysm identified.  IMPRESSION:  1.  Gallstones. 2.  Increased caliber of the common bile duct which measures up to 8.2 mm. If there is clinical concern for choledocholithiasis an MRCP may be helpful.   Original Report Authenticated By: Signa Kell, M.D.   Ir Perc Cholecystostomy  10/02/2012   *RADIOLOGY REPORT*  Clinical Data: Acute calculus cholecystitis, nonoperative candidate  ULTRASOUND FLUOROSCOPIC PERCUTANEOUS TRANSHEPATIC CHOLECYSTOSTOMY  Date:  10/02/2012 12:00:00  Radiologist:  M. Ruel Favors, M.D.  Medications:  2 mg Versed, 150 mcg Fentanyl, 50 mg Demerol, the patient is already receiving antibiotics.  Guidance:  Ultrasound fluoroscopic  Fluoroscopy time:  42 seconds  Sedation time:  40 minutes  Contrast volume:  None.  Complications:  No immediate  PROCEDURE/FINDINGS:  Informed consent was obtained from the patient following explanation of the procedure, risks, benefits and alternatives. The patient understands, agrees and consents for the procedure. All questions were addressed.  A time out was performed.  Maximal barrier sterile technique utilized including caps, mask, sterile gowns, sterile gloves, large sterile drape, hand hygiene, and betadine  Previous imaging reviewed.  Preliminary ultrasound performed in the right upper quadrant.  The distended gallbladder containing gallstones was localized.  Under sterile conditions and local anesthesia, a 21 gauge 15 cm access needle was advanced under direct  ultrasound through a transhepatic window into the gallbladder.  Needle position confirmed with ultrasound.  There was return of bile.  Guide wire advanced.  Transitional dilator inserted.  Guide wire exchanged for an Amplatz guide wire.  Tract dilatation performed to insert a 10-French drain catheter. Retention loop formed in the gallbladder.  Syringe aspiration yielded 40 ml exudative bile.  Sample sent for Gram stain and culture.  Catheter secured with a Prolene suture and connected to external gravity drainage.  Sterile dressing applied.  No immediate complication.  IMPRESSION: Successful ultrasound and fluoroscopic 10-French cholecystostomy   Original Report Authenticated By: Judie Petit. Miles Costain, M.D.   Dg Chest [place]  1 View  10/04/2012   *RADIOLOGY REPORT*  Clinical Data: Increased shortness of breath, evaluate for possible fluid overload  PORTABLE CHEST - 1 VIEW  Comparison: Portable chest x-ray of 10/02/2012  Findings: The lungs appear slightly better aerated.  Mild cardiomegaly is stable.  No definite pulmonary vascular congestion is currently seen.  There are degenerative changes throughout the thoracic spine.  IMPRESSION: Slightly better aeration.  Stable cardiomegaly.  No definite pulmonary vascular congestion.   Original Report Authenticated By: Dwyane Dee, M.D.   Dg Chest Portable 1 View  10/02/2012   *RADIOLOGY REPORT*  Clinical Data: Dyspnea.  PORTABLE CHEST - 1 VIEW  Comparison: One-view chest 09/30/2012.  Findings: The heart to size is exaggerated by low lung volumes. Mild pulmonary vascular congestion is present.  Minimal bibasilar atelectasis is noted. The visualized soft tissues and bony thorax are unremarkable.  IMPRESSION:  1.  Borderline cardiomegaly and mild pulmonary vascular congestion, suggesting early congestive heart failure. 2.  Minimal bibasilar atelectasis is associated with low lung volumes.   Original Report Authenticated By: Marin Roberts, M.D.   Dg Chest Port 1 View  09/30/2012    *RADIOLOGY REPORT*  Clinical Data: Mid sternal chest pain since 06:00 p.m.  PORTABLE CHEST - 1 VIEW  Comparison: 04/01/2008  Findings: The heart size and pulmonary vascularity are normal. The lungs appear clear and expanded without focal air space disease or consolidation. No blunting of the costophrenic angles.  No pneumothorax.  Mediastinal contours appear intact.  No significant change since previous study.  IMPRESSION: No evidence of active pulmonary disease.   Original Report Authenticated By: Burman Nieves, M.D.   Ct Angio Chest Aorta W/cm &/or Wo/cm  09/30/2012   *RADIOLOGY REPORT*  Clinical Data:  Chest pain radiating to the back.  Epigastric pain. Nausea and vomiting.  CT ANGIOGRAPHY CHEST, ABDOMEN AND PELVIS  Technique:  Multidetector CT imaging through the chest, abdomen and pelvis was performed using the standard protocol during bolus administration of intravenous contrast.  Multiplanar reconstructed images including MIPs were obtained and reviewed to evaluate the vascular anatomy.  Contrast: OMNIPAQUE IOHEXOL 350 MG/ML SOLN  Comparison:   None.  CTA CHEST  Findings:  Noncontrast CT images of the chest demonstrate no evidence of intramural thrombus in the thoracic aorta.  Suggestion of minimal calcification in the coronary arteries.  Contrast enhanced images demonstrate normal caliber thoracic aorta. No evidence of aneurysm.  Thoracic aorta is patent without evidence of dissection or significant thrombus.  Normal appearance of the great vessels.  Visualized pulmonary arteries appear patent without evidence of central pulmonary embolus.  Normal heart size.  No significant lymphadenopathy in the chest. The esophagus is decompressed.  Thyroid gland appears atrophic.  No pleural effusions.  Visualization of the lungs is limited due to respiratory motion artifact but there appears to be a patchy mosaic pattern suggesting edema or emphysema.  No focal consolidation.  No pneumothorax.  Airways appear  patent.  Degenerative changes in the thoracic spine with normal alignment.  No destructive bone lesions are appreciated.   Review of the MIP images confirms the above findings.  IMPRESSION: No evidence of thoracic aortic dissection.  CTA ABDOMEN AND PELVIS  Findings:  Normal caliber abdominal aorta.  No aneurysm.  The abdominal aorta, celiac axis, superior mesenteric artery, bilateral single renal arteries, the inferior mesenteric artery, and bilateral iliac, common iliac, and femoral arteries appear patent. No evidence of dissection or significant thrombus.  The gallbladder is distended with suggestion of sludge or  possibly layering small stones.  No wall thickening or inflammatory infiltration is suggested.  The liver, spleen, pancreas, adrenal glands, inferior vena cava, and retroperitoneal lymph nodes are unremarkable.  Sub centimeter parenchymal cysts in both kidneys. No evidence of hydronephrosis or solid mass.  The esophagus, small bowel, and colon are mostly decompressed.  No abnormal bowel distension.  No free air or free fluid in the abdomen.  Pelvis:  The uterus appears atrophic or surgically absent.  No abnormal adnexal masses.  Rectosigmoid colon is stool-filled without evidence of diverticulitis.  The appendix is normal. No free or loculated pelvic fluid collections.  No significant pelvic lymphadenopathy.  Mild degenerative changes in the lumbar spine.  Normal alignment. No destructive bone lesions appreciated.   Review of the MIP images confirms the above findings.  IMPRESSION: No evidence of aneurysm or dissection of the abdominal aorta.  The gallbladder is distended and contains sludge or small stones.   Original Report Authenticated By: Burman Nieves, M.D.   Ct Angio Abd/pel W/ And/or W/o  09/30/2012   *RADIOLOGY REPORT*  Clinical Data:  Chest pain radiating to the back.  Epigastric pain. Nausea and vomiting.  CT ANGIOGRAPHY CHEST, ABDOMEN AND PELVIS  Technique:  Multidetector CT imaging  through the chest, abdomen and pelvis was performed using the standard protocol during bolus administration of intravenous contrast.  Multiplanar reconstructed images including MIPs were obtained and reviewed to evaluate the vascular anatomy.  Contrast: OMNIPAQUE IOHEXOL 350 MG/ML SOLN  Comparison:   None.  CTA CHEST  Findings:  Noncontrast CT images of the chest demonstrate no evidence of intramural thrombus in the thoracic aorta.  Suggestion of minimal calcification in the coronary arteries.  Contrast enhanced images demonstrate normal caliber thoracic aorta. No evidence of aneurysm.  Thoracic aorta is patent without evidence of dissection or significant thrombus.  Normal appearance of the great vessels.  Visualized pulmonary arteries appear patent without evidence of central pulmonary embolus.  Normal heart size.  No significant lymphadenopathy in the chest. The esophagus is decompressed.  Thyroid gland appears atrophic.  No pleural effusions.  Visualization of the lungs is limited due to respiratory motion artifact but there appears to be a patchy mosaic pattern suggesting edema or emphysema.  No focal consolidation.  No pneumothorax.  Airways appear patent.  Degenerative changes in the thoracic spine with normal alignment.  No destructive bone lesions are appreciated.   Review of the MIP images confirms the above findings.  IMPRESSION: No evidence of thoracic aortic dissection.  CTA ABDOMEN AND PELVIS  Findings:  Normal caliber abdominal aorta.  No aneurysm.  The abdominal aorta, celiac axis, superior mesenteric artery, bilateral single renal arteries, the inferior mesenteric artery, and bilateral iliac, common iliac, and femoral arteries appear patent. No evidence of dissection or significant thrombus.  The gallbladder is distended with suggestion of sludge or possibly layering small stones.  No wall thickening or inflammatory infiltration is suggested.  The liver, spleen, pancreas, adrenal glands,  inferior vena cava, and retroperitoneal lymph nodes are unremarkable.  Sub centimeter parenchymal cysts in both kidneys. No evidence of hydronephrosis or solid mass.  The esophagus, small bowel, and colon are mostly decompressed.  No abnormal bowel distension.  No free air or free fluid in the abdomen.  Pelvis:  The uterus appears atrophic or surgically absent.  No abnormal adnexal masses.  Rectosigmoid colon is stool-filled without evidence of diverticulitis.  The appendix is normal. No free or loculated pelvic fluid collections.  No significant pelvic lymphadenopathy.  Mild degenerative changes in the lumbar spine.  Normal alignment. No destructive bone lesions appreciated.   Review of the MIP images confirms the above findings.  IMPRESSION: No evidence of aneurysm or dissection of the abdominal aorta.  The gallbladder is distended and contains sludge or small stones.   Original Report Authenticated By: Burman Nieves, M.D.    Scheduled Meds: . antiseptic oral rinse  15 mL Mouth Rinse q12n4p  . bisoprolol  10 mg Oral Daily  . chlorhexidine  15 mL Mouth Rinse BID  . ciprofloxacin  500 mg Oral BID  . docusate sodium  100 mg Oral Daily  . enoxaparin (LOVENOX) injection  40 mg Subcutaneous Q24H  . estrogens (conjugated)  0.625 mg Oral BID  . furosemide  40 mg Oral BID  . ketorolac  30 mg Intravenous Once  . lisinopril  10 mg Oral BID  . OxyCODONE  10 mg Oral Q12H  . OxyCODONE  20 mg Oral Custom  . pantoprazole (PROTONIX) IV  40 mg Intravenous QHS  . potassium chloride  40 mEq Oral BID  . senna  1 tablet Oral QHS  . thyroid  120 mg Oral QAC breakfast   Continuous Infusions:   Principal Problem:   Benign hypertensive heart disease without heart failure Active Problems:   Hypothyroidism   Polycythemia vera(238.4)   Fibromyalgia   CAD (coronary artery disease)   Cholelithiases   Unspecified constipation   Septic shock    Time spent: 40 minutes   [facility]  [facility] Pager [phone]. If 8PM-8AM, please contact night-coverage at [email or web address], password [facility] 10/04/2012, 12:25 PM  LOS: 4 days

## 2012-10-04 NOTE — Progress Notes (Signed)
PT Cancellation Note  Patient Details Name: LAKISA LOTZ MRN: [id number] DOB: 11-26-1945   Cancelled Treatment:    Reason Eval/Treat Not Completed: Patient at procedure or test/unavailable. Initiated eval however transportation arrived to take pt to [facility]. PT to return as able 10/05/12.    Lyncoln Maskell, Becky Sax 10/04/2012, 3:17 PM

## 2012-10-04 NOTE — Progress Notes (Signed)
Patient ID: [name], female   DOB: [birth year], 67 y.o.   MRN: [id number]    Subjective: Pt c/o SOB today.  States she normally takes 80mg  lasiz and HCTZ daily which she has not gotten here.  Otherwise, her pain is much better today and she feels better, except for continued weakness, that she thinks may be slightly worse.  Tolerated clear liquids well.  Objective: Vital signs in last 24 hours: Temp:  [97.3 F (36.3 C)-99.4 F (37.4 C)] 99.4 F (37.4 C) (07/09 0948) Pulse Rate:  [49-114] 114 (07/09 0948) Resp:  [16-23] 18 (07/09 0948) BP: (94-153)/(49-87) 142/74 mmHg (07/09 0948) SpO2:  [93 %-100 %] 94 % (07/09 0948) Last BM Date: 09/29/12  Intake/Output from previous day: 07/08 0701 - 07/09 0700 In: 3702.5 [P.O.:340; I.V.:3000; Blood:362.5] Out: 250 [Drains:250] Intake/Output this shift: Total I/O In: 120 [P.O.:120] Out: -   PE: Abd: soft, much less tender, +BS, NE, drain with bilious output Heart: mild tachy Lungs: decreased BS at bases  Lab Results:   Recent Labs  10/03/12 0400 10/04/12 0610  WBC 19.9* 10.1  HGB 5.2* 6.2*  HCT 21.8* 24.0*  PLT 222 181   BMET  Recent Labs  10/03/12 0400 10/04/12 0610  NA 135 135  K 3.9 4.0  CL 102 104  CO2 24 24  GLUCOSE 127* 91  BUN 15 10  CREATININE 0.80 0.65  CALCIUM 8.6 8.2*   PT/INR  Recent Labs  10/02/12 1021  LABPROT 18.1*  INR 1.54*   CMP     Component Value Date/Time   NA 135 10/04/2012 0610   K 4.0 10/04/2012 0610   CL 104 10/04/2012 0610   CO2 24 10/04/2012 0610   GLUCOSE 91 10/04/2012 0610   BUN 10 10/04/2012 0610   CREATININE 0.65 10/04/2012 0610   CALCIUM 8.2* 10/04/2012 0610   PROT 6.0 10/03/2012 0400   ALBUMIN 2.7* 10/03/2012 0400   AST 13 10/03/2012 0400   ALT 9 10/03/2012 0400   ALKPHOS 61 10/03/2012 0400   BILITOT 0.4 10/03/2012 0400   GFRNONAA 89* 10/04/2012 0610   GFRAA >90 10/04/2012 0610   Lipase     Component Value Date/Time   LIPASE 23 09/30/2012 0039       Studies/Results: Ir Perc  Cholecystostomy  10/02/2012   *RADIOLOGY REPORT*  Clinical Data: Acute calculus cholecystitis, nonoperative candidate  ULTRASOUND FLUOROSCOPIC PERCUTANEOUS TRANSHEPATIC CHOLECYSTOSTOMY  Date:  10/02/2012 12:00:00  Radiologist:  M. Ruel Favors, M.D.  Medications:  2 mg Versed, 150 mcg Fentanyl, 50 mg Demerol, the patient is already receiving antibiotics.  Guidance:  Ultrasound fluoroscopic  Fluoroscopy time:  42 seconds  Sedation time:  40 minutes  Contrast volume:  None.  Complications:  No immediate  PROCEDURE/FINDINGS:  Informed consent was obtained from the patient following explanation of the procedure, risks, benefits and alternatives. The patient understands, agrees and consents for the procedure. All questions were addressed.  A time out was performed.  Maximal barrier sterile technique utilized including caps, mask, sterile gowns, sterile gloves, large sterile drape, hand hygiene, and betadine  Previous imaging reviewed.  Preliminary ultrasound performed in the right upper quadrant.  The distended gallbladder containing gallstones was localized.  Under sterile conditions and local anesthesia, a 21 gauge 15 cm access needle was advanced under direct ultrasound through a transhepatic window into the gallbladder.  Needle position confirmed with ultrasound.  There was return of bile.  Guide wire advanced.  Transitional dilator inserted.  Guide wire exchanged for an  Amplatz guide wire.  Tract dilatation performed to insert a 10-French drain catheter. Retention loop formed in the gallbladder.  Syringe aspiration yielded 40 ml exudative bile.  Sample sent for Gram stain and culture.  Catheter secured with a Prolene suture and connected to external gravity drainage.  Sterile dressing applied.  No immediate complication.  IMPRESSION: Successful ultrasound and fluoroscopic 10-French cholecystostomy   Original Report Authenticated By: Judie Petit. Shick, M.D.   Dg Chest Portable 1 View  10/02/2012   *RADIOLOGY REPORT*  Clinical  Data: Dyspnea.  PORTABLE CHEST - 1 VIEW  Comparison: One-view chest 09/30/2012.  Findings: The heart to size is exaggerated by low lung volumes. Mild pulmonary vascular congestion is present.  Minimal bibasilar atelectasis is noted. The visualized soft tissues and bony thorax are unremarkable.  IMPRESSION:  1.  Borderline cardiomegaly and mild pulmonary vascular congestion, suggesting early congestive heart failure. 2.  Minimal bibasilar atelectasis is associated with low lung volumes.   Original Report Authenticated By: Marin Roberts, M.D.    Anti-infectives: Anti-infectives   Start     Dose/Rate Route Frequency Ordered Stop   10/04/12 1130  ciprofloxacin (CIPRO) tablet 500 mg     500 mg Oral 2 times daily 10/04/12 1121     10/01/12 0815  metroNIDAZOLE (FLAGYL) IVPB 500 mg  Status:  Discontinued     500 mg 100 mL/hr over 60 Minutes Intravenous Every 8 hours 10/01/12 0810 10/03/12 0816   09/30/12 1200  ciprofloxacin (CIPRO) IVPB 400 mg  Status:  Discontinued     400 mg 200 mL/hr over 60 Minutes Intravenous Every 12 hours 09/30/12 1057 10/04/12 1121       Assessment/Plan  1. Acute cholecystitis 2. Shortness of breath, ? Fluid overload 3. PCV, with associated anemia, secondary to phlebotomy 4. Hypothyroidism 5. CAD 6. Deconditioning  Plan: 1. Suspect patient is SOB secondary to fluid overload given IVFs at 125cc and no home lasix or HCTZ.  However, we will get a CXR to eval for this and to rule out any other potential causes.  I doubt this is secondary to a clot.  She is tachy today; however, she has been off of her beta blocker as well. She has been on DVT prophylaxis and her hgn is only 6.2.  I will SLIV and restart her home lasix and HCTZ. 2. Other home meds were restarted as well 3. Dc IV cipro and change to oral cipro 4. Advance to low fat diet 5. I have asked medicine to reconsult since CCM signed off.  They have questions about her armour thyroid meds. 6. PT eval for  deconditioning and weakness 7. Hopefully home within the next 1-2 days pending improvement in her current problems. 8. Dc CBG monitoring.  She is not diabetic and her CBGs are find   LOS: 4 days    Euva Rundell E 10/04/2012, 11:22 AM Pager: [phone]

## 2012-10-04 NOTE — Progress Notes (Signed)
Subjective: Perc chole drain placed 7/7 Doing very well Wbc down; afeb Output great Feels better  Objective: Vital signs in last 24 hours: Temp:  [97.3 F (36.3 C)-99.4 F (37.4 C)] 99.4 F (37.4 C) (07/09 0948) Pulse Rate:  [49-114] 114 (07/09 0948) Resp:  [16-23] 18 (07/09 0948) BP: (94-153)/(49-87) 142/74 mmHg (07/09 0948) SpO2:  [93 %-100 %] 94 % (07/09 0948) Last BM Date: 09/29/12  Intake/Output from previous day: 07/08 0701 - 07/09 0700 In: 3702.5 [P.O.:340; I.V.:3000; Blood:362.5] Out: 250 [Drains:250] Intake/Output this shift: Total I/O In: 120 [P.O.:120] Out: -   PE:  Afeb; vss Wbc 10.1 Chole drain inract Output 250 cc yesterday 50 cc in bag- bilious Site clean and dry; sl tender Cx: Gr + cocci and rods  Lab Results:   Recent Labs  10/03/12 0400 10/04/12 0610  WBC 19.9* 10.1  HGB 5.2* 6.2*  HCT 21.8* 24.0*  PLT 222 181   BMET  Recent Labs  10/03/12 0400 10/04/12 0610  NA 135 135  K 3.9 4.0  CL 102 104  CO2 24 24  GLUCOSE 127* 91  BUN 15 10  CREATININE 0.80 0.65  CALCIUM 8.6 8.2*   PT/INR  Recent Labs  10/02/12 1021  LABPROT 18.1*  INR 1.54*   ABG No results found for this basename: PHART, PCO2, PO2, HCO3,  in the last 72 hours  Studies/Results: Ir Perc Cholecystostomy  10/02/2012   *RADIOLOGY REPORT*  Clinical Data: Acute calculus cholecystitis, nonoperative candidate  ULTRASOUND FLUOROSCOPIC PERCUTANEOUS TRANSHEPATIC CHOLECYSTOSTOMY  Date:  10/02/2012 12:00:00  Radiologist:  M. Ruel Favors, M.D.  Medications:  2 mg Versed, 150 mcg Fentanyl, 50 mg Demerol, the patient is already receiving antibiotics.  Guidance:  Ultrasound fluoroscopic  Fluoroscopy time:  42 seconds  Sedation time:  40 minutes  Contrast volume:  None.  Complications:  No immediate  PROCEDURE/FINDINGS:  Informed consent was obtained from the patient following explanation of the procedure, risks, benefits and alternatives. The patient understands, agrees and  consents for the procedure. All questions were addressed.  A time out was performed.  Maximal barrier sterile technique utilized including caps, mask, sterile gowns, sterile gloves, large sterile drape, hand hygiene, and betadine  Previous imaging reviewed.  Preliminary ultrasound performed in the right upper quadrant.  The distended gallbladder containing gallstones was localized.  Under sterile conditions and local anesthesia, a 21 gauge 15 cm access needle was advanced under direct ultrasound through a transhepatic window into the gallbladder.  Needle position confirmed with ultrasound.  There was return of bile.  Guide wire advanced.  Transitional dilator inserted.  Guide wire exchanged for an Amplatz guide wire.  Tract dilatation performed to insert a 10-French drain catheter. Retention loop formed in the gallbladder.  Syringe aspiration yielded 40 ml exudative bile.  Sample sent for Gram stain and culture.  Catheter secured with a Prolene suture and connected to external gravity drainage.  Sterile dressing applied.  No immediate complication.  IMPRESSION: Successful ultrasound and fluoroscopic 10-French cholecystostomy   Original Report Authenticated By: Judie Petit. Shick, M.D.   Dg Chest Portable 1 View  10/02/2012   *RADIOLOGY REPORT*  Clinical Data: Dyspnea.  PORTABLE CHEST - 1 VIEW  Comparison: One-view chest 09/30/2012.  Findings: The heart to size is exaggerated by low lung volumes. Mild pulmonary vascular congestion is present.  Minimal bibasilar atelectasis is noted. The visualized soft tissues and bony thorax are unremarkable.  IMPRESSION:  1.  Borderline cardiomegaly and mild pulmonary vascular congestion, suggesting early congestive heart  failure. 2.  Minimal bibasilar atelectasis is associated with low lung volumes.   Original Report Authenticated By: Marin Roberts, M.D.    Anti-infectives: Anti-infectives   Start     Dose/Rate Route Frequency Ordered Stop   10/01/12 0815  metroNIDAZOLE  (FLAGYL) IVPB 500 mg  Status:  Discontinued     500 mg 100 mL/hr over 60 Minutes Intravenous Every 8 hours 10/01/12 0810 10/03/12 0816   09/30/12 1200  ciprofloxacin (CIPRO) IVPB 400 mg     400 mg 200 mL/hr over 60 Minutes Intravenous Every 12 hours 09/30/12 1057        Assessment/Plan: s/p Procedure(s): LAPAROSCOPIC CHOLECYSTECTOMY WITH INTRAOPERATIVE CHOLANGIOGRAM (N/A)   Chole drain intact Output good Feels better Plan per CCS   LOS: 4 days    [name] A 10/04/2012

## 2012-10-04 NOTE — Progress Notes (Signed)
  Echocardiogram 2D Echocardiogram has been performed.  Mclean Moya 10/04/2012, 3:10 PM

## 2012-10-05 ENCOUNTER — Telehealth: Payer: Self-pay | Admitting: Hematology & Oncology

## 2012-10-05 ENCOUNTER — Inpatient Hospital Stay (HOSPITAL_COMMUNITY): Payer: Medicare Other

## 2012-10-05 DIAGNOSIS — E039 Hypothyroidism, unspecified: Secondary | ICD-10-CM

## 2012-10-05 DIAGNOSIS — I639 Cerebral infarction, unspecified: Secondary | ICD-10-CM | POA: Diagnosis present

## 2012-10-05 DIAGNOSIS — IMO0001 Reserved for inherently not codable concepts without codable children: Secondary | ICD-10-CM

## 2012-10-05 DIAGNOSIS — R5381 Other malaise: Secondary | ICD-10-CM

## 2012-10-05 HISTORY — DX: Cerebral infarction, unspecified: I63.9

## 2012-10-05 LAB — T4, FREE: Free T4: 0.82 ng/dL (ref 0.80–1.80)

## 2012-10-05 LAB — CULTURE, ROUTINE-ABSCESS

## 2012-10-05 LAB — URINALYSIS, ROUTINE W REFLEX MICROSCOPIC
Ketones, ur: NEGATIVE mg/dL
Nitrite: NEGATIVE
Protein, ur: NEGATIVE mg/dL
pH: 5 (ref 5.0–8.0)

## 2012-10-05 LAB — BASIC METABOLIC PANEL
BUN: 8 mg/dL (ref 6–23)
Chloride: 97 mEq/L (ref 96–112)
GFR calc Af Amer: >90 mL/min (ref >90)
Potassium: 3.9 mEq/L (ref 3.5–5.1)
Sodium: 134 mEq/L — ABNORMAL LOW (ref 135–145)

## 2012-10-05 LAB — CBC WITH DIFFERENTIAL/PLATELET
Basophils Absolute: 0 10*3/uL (ref 0.0–0.1)
Basophils Relative: 0 % (ref 0–1)
HCT: 26.7 % — ABNORMAL LOW (ref 36.0–46.0)
Hemoglobin: 7.1 g/dL — ABNORMAL LOW (ref 12.0–15.0)
Lymphocytes Relative: 5 % — ABNORMAL LOW (ref 12–46)
Monocytes Relative: 11 % (ref 3–12)
Neutro Abs: 9.7 10*3/uL — ABNORMAL HIGH (ref 1.7–7.7)
RDW: 24.9 % — ABNORMAL HIGH (ref 11.5–15.5)
WBC: 11.7 10*3/uL — ABNORMAL HIGH (ref 4.0–10.5)

## 2012-10-05 LAB — PRO B NATRIURETIC PEPTIDE: Pro B Natriuretic peptide (BNP): 2326 pg/mL — ABNORMAL HIGH (ref 0–125)

## 2012-10-05 LAB — URINE MICROSCOPIC-ADD ON

## 2012-10-05 MED ORDER — SODIUM CHLORIDE 0.9 % IV SOLN
INTRAVENOUS | Status: DC
  Administered 2012-10-05 – 2012-10-07 (×3): via INTRAVENOUS

## 2012-10-05 MED ORDER — OXYCODONE HCL ER 10 MG PO T12A: 10.0000 mg | EXTENDED_RELEASE_TABLET | Freq: Every day | ORAL | Status: DC | PRN

## 2012-10-05 MED ORDER — POLYETHYLENE GLYCOL 3350 17 G PO PACK
17.0000 g | PACK | Freq: Every day | ORAL | Status: DC
  Administered 2012-10-05: 17 g via ORAL
  Filled 2012-10-05 (×2): qty 1

## 2012-10-05 MED ORDER — ASPIRIN 325 MG PO TABS
325.0000 mg | ORAL_TABLET | Freq: Every day | ORAL | Status: DC
  Administered 2012-10-05 – 2012-10-09 (×5): 325 mg via ORAL
  Filled 2012-10-05 (×8): qty 1

## 2012-10-05 MED ORDER — PANTOPRAZOLE SODIUM 40 MG PO TBEC
40.0000 mg | DELAYED_RELEASE_TABLET | Freq: Every day | ORAL | Status: DC
  Administered 2012-10-05 – 2012-10-11 (×6): 40 mg via ORAL
  Filled 2012-10-05 (×7): qty 1

## 2012-10-05 MED ORDER — TEMAZEPAM 15 MG PO CAPS
15.0000 mg | ORAL_CAPSULE | Freq: Every evening | ORAL | Status: DC | PRN
  Administered 2012-10-05 – 2012-10-11 (×7): 15 mg via ORAL
  Filled 2012-10-05 (×7): qty 1

## 2012-10-05 NOTE — Progress Notes (Signed)
Subjective: Pts husband is very upset and unpleasant stating that the patients pain medication is being given at the wrong time.  Furthermore, stating that she needs to resume mineral oil for the constipation and a laxative.  He states she is more lethargic today, the patient concurs.  She reports that her shortness of breath has improved.  She reports that her pain has improved from yesterday.  +flatus, she has not had a bm yet. Pt is resting comfortably in bed  Objective: Vital signs in last 24 hours: Temp:  [98.3 F (36.8 C)-99.4 F (37.4 C)] 98.9 F (37.2 C) (07/10 0631) Pulse Rate:  [83-110] 91 (07/10 0631) Resp:  [18] 18 (07/10 0631) BP: (131-137)/(70-90) 136/90 mmHg (07/10 0631) SpO2:  [92 %-96 %] 92 % (07/10 0631) Last BM Date: 09/29/12  Intake/Output from previous day: 07/09 0701 - 07/10 0700 In: 600 [P.O.:600] Out: 145 [Drains:145] Intake/Output this shift: Total I/O In: -  Out: 20 [Drains:20]  General appearance: alert, cooperative, appears older than stated age and no distress Resp: clear to auscultation bilaterally Cardio: regular rate and rhythm, S1, S2 normal, no murmur, click, rub or gallop GI: bowel sounds are present, abdomen is soft round and mildly distended.  right perc drain with bilious output Extremities: extremities normal, atraumatic, no cyanosis or edema  Lab Results:   Recent Labs  10/04/12 0610 10/05/12 1000  WBC 10.1 11.7*  HGB 6.2* 7.1*  HCT 24.0* 26.7*  PLT 181 231   BMET  Recent Labs  10/04/12 1309 10/05/12 1000  NA 135 134*  K 3.8 3.9  CL 103 97  CO2 23 27  GLUCOSE 90 134*  BUN 8 8  CREATININE 0.57 0.60  CALCIUM 8.2* 8.6   Studies/Results: Dg Chest Port 1 View  10/04/2012   *RADIOLOGY REPORT*  Clinical Data: Increased shortness of breath, evaluate for possible fluid overload  PORTABLE CHEST - 1 VIEW  Comparison: Portable chest x-ray of 10/02/2012  Findings: The lungs appear slightly better aerated.  Mild cardiomegaly is  stable.  No definite pulmonary vascular congestion is currently seen.  There are degenerative changes throughout the thoracic spine.  IMPRESSION: Slightly better aeration.  Stable cardiomegaly.  No definite pulmonary vascular congestion.   Original Report Authenticated By: Dwyane Dee, M.D.    Anti-infectives: Anti-infectives   Start     Dose/Rate Route Frequency Ordered Stop   10/04/12 1300  ciprofloxacin (CIPRO) tablet 500 mg     500 mg Oral 2 times daily 10/04/12 1121     10/01/12 0815  metroNIDAZOLE (FLAGYL) IVPB 500 mg  Status:  Discontinued     500 mg 100 mL/hr over 60 Minutes Intravenous Every 8 hours 10/01/12 0810 10/03/12 0816   09/30/12 1200  ciprofloxacin (CIPRO) IVPB 400 mg  Status:  Discontinued     400 mg 200 mL/hr over 60 Minutes Intravenous Every 12 hours 09/30/12 1057 10/04/12 1121      Assessment/Plan: 1. Acute cholecystitis  2. Shortness of breath-improved, echo normal.  Dr. Patty Sermons saw pt this am.  Lasix resumed. 3. PCV, with associated anemia, secondary to phlebotomy  4. Hypothyroidism  5. CAD  6. Deconditioning  -Drain-23ml bilious/24h -husband reports she is "more sleepy" this is multifactorial including anemia, pain medication, acute disease.  Defer armour thyroid to internal medicine and continue to monitor -addressed pain medication and changed to appropriate time.  Also relayed the information to nursing -add miralax,  I will discuss with Dr. Dwain Sarna regarding mineral oil as i am not familiar  with its use for constipation and GBD -continue with low fat diet -PT evaluation today -hopefully discharge tomorrow pending physical therapy evaluation -husband wishes to follow up with Dr. Lindie Spruce   LOS: 5 days    Bonner Puna [facility] ANP-BC Pager [phone]  10/05/2012 11:36 AM

## 2012-10-05 NOTE — Telephone Encounter (Signed)
pts husband cx 7-14 phlebotomy said would reschedule after 7-17 MD appointment

## 2012-10-05 NOTE — Progress Notes (Signed)
PT Cancellation Note  Patient Details Name: CARAGH GASPER MRN: [id number] DOB: Nov 18, 1945   Cancelled Treatment:    Reason Eval/Treat Not Completed: Medical issues which prohibited therapy. Noted possibly new neuro changes. Per RN patient about to go down for MRI, will hold PT eval until this is complete. Will f/u tomorrow.   [facility] HELEN 10/05/2012, 3:51 PM Pager: [phone]

## 2012-10-05 NOTE — Progress Notes (Addendum)
While doing a regular round, pt's husband states, "when am I gonna see Dr. Benjamine Mola?"  Asked husband if theres anything I can do.  He says, "no.  I need Dr. Benjamine Mola".  Asked husband to please let me know what it is so I can see if I can address any issue.  Husband then states, "Her speech is slurred now.  Her right arm is weaker than it was this am".  Neuro assessment done at this time.  Right arm weakness seems to be the same as it was on initial assessment upon which husband said it was baseline weaker than left.  Pt's speech is slightly garbled every few words but intelligible.  Pt is lethargic as she has been the entire shift.  Pt's pupils 4 Round brisk bilaterally.  Pt followed all commands.  Denies any decreased sensation.  Face symmetrical.  During neuro assessment, pt continues to feed herself with the left arm as if nothing is wrong.  Notified Dr. Benjamine Mola of findings and pt's husband's concerns who is coming to assess pt herself.  Stressed to husband the importance of him letting nursing staff know of any changes so we can act fast if anything acute was to occur.  Husband just looked at me and didn't acknowledge instructions.

## 2012-10-05 NOTE — Progress Notes (Signed)
Ms. Croker is back up on [address]. She is not going to have surgery now for about 4-6 weeks. She has a cholecystotomy tube in.  She is low but lethargic this morning. I don't know this is from her sleep medicine. There's not much in any when necessary IV medications.  Blood pressure is much better.  Your echocardiogram done. His good wall motion function. LVEF is about 60%. Am I sure why her pro-beta natruretic peptide is so high.  No CBC back yet today. She did get 1 unit of blood 2 days ago. This is secondary to anticipating surgery and also some hypotensive changes.  She may need some physical therapy. She does tend to decompensate quickly when she can't do her normal routine.  Cultures are negative. She is on ciprofloxacin.  She is afebrile. Blood pressure 136/90. Lungs are clear. Cardiac exam regular rate and rhythm with no murmurs rubs or bruits. Abdomen is soft. She has decreased bowel sounds. Her cholecystotomy tube is in the right upper quadrant. Extremities shows some trace edema in her legs.  From a hematologic point of view, not much that we need to do for her. As long as she's hemodynamically stable, I would not transfuse her.  Hopefully, she will be able to have her gallbladder out, likely around Labor Day.  Lawerance Sabal 1:5-7

## 2012-10-05 NOTE — Progress Notes (Signed)
Patient ID: [name], female   DOB: Jul 29, 1945, 67 y.o.   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  I have consulted Dr. Roseanne Reno from Neurology.  I also let the patient's husband know the findings. Violeta Gelinas, MD, MPH, FACS Pager: [phone]

## 2012-10-05 NOTE — Progress Notes (Signed)
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 Should not get above 7 per hematology (Ennever, transfused one unit by Dr. Twanna Hy on 7/8, hemoglobin up to 6.2 )  Hypertension, HCTZ/lisinopril/Lasix resumed-discontinue HCTZ and decrease lisinopril dosing  CAD Pt's Cardiologist note fm 09/28/2010 She does not have a history of known ischemic heart disease. She had an echocardiogram in 12/04/09 which showed normal left ventricular systolic function with an ejection fraction of 55-60% and normal diastolic function and mild aortic sclerosis.    Hypothyroidism; per patient and husband unable to take Synthroid. On Armour Thyroid 120 mg daily (patient has home meds) TSH suppressed check free t4 and will need to recheck as an outaptient  Weakness- >? Related to pain medications- I verified the doseage and way patietn takes with [facility] in [place] -MRI to r/o CVA -U/A -chest x ray neg for PNA on 7/9   Code Status: full Family Communication: patient/husband Disposition Plan: per surgery      HPI/Subjective: Husband notices right arm weakness yesterday evening Having some coordination issues  Objective: Filed Vitals:   10/04/12 0948 10/04/12 1337 10/04/12 2141 10/05/12 0631  BP: 142/74 137/74 131/70 136/90  Pulse: 114 110 83 91  Temp: 99.4 F (37.4 C) 99.4 F (37.4 C) 98.3 F (36.8 C) 98.9 F (37.2 C)  TempSrc: Oral Oral Oral Oral  Resp: 18  18 18 18   SpO2: 94% 96% 95% 92%    Intake/Output Summary (Last 24 hours) at 10/05/12 1237 Last data filed at 10/05/12 0900  Gross per 24 hour  Intake    480 ml  Output    165 ml  Net    315 ml   There were no vitals filed for this visit.  Exam:   General:  Slow to respond  Cardiovascular: rrr  Respiratory: no wheezing  Abdomen: +BS, soft  Musculoskeletal: moves all 4 ext   Data Reviewed: Basic Metabolic Panel:  Recent Labs Lab 09/30/12 0115  10/01/12 0500 10/02/12 0505 10/03/12 0400 10/04/12 0610 10/04/12 1309 10/05/12 1000  NA 137  --  134* 134* 135 135 135 134*  K 3.4*  --  3.3* 4.6 3.9 4.0 3.8 3.9  CL 94*  --  98 99 102 104 103 97  CO2  --   --  30 24 24 24 23 27   GLUCOSE 125*  --  123* 110* 127* 91 90 134*  BUN 9  --  8 13 15 10 8 8   CREATININE 1.00  < > 0.76 1.02 0.80 0.65 0.57 0.60  CALCIUM  --   --  8.6 8.7 8.6 8.2* 8.2* 8.6  MG  --   --  1.8 2.1 2.3  --   --   --   < > = values in this interval not displayed. Liver Function Tests:  Recent Labs Lab 09/30/12 0039 10/01/12 0500 10/02/12 0505 10/03/12 0400 10/04/12 1309  AST 15 11 19 13 13   ALT 6 5 8 9 9   ALKPHOS 52 49 78 61  46  BILITOT 0.3 0.6 0.6 0.4 0.4  PROT 6.6 6.0 6.3 6.0 5.6*  ALBUMIN 3.5 3.0* 3.0* 2.7* 2.5*    Recent Labs Lab 09/30/12 0039  LIPASE 23   No results found for this basename: AMMONIA,  in the last 168 hours CBC:  Recent Labs Lab 09/30/12 0535  10/01/12 0500 10/02/12 0505 10/03/12 0400 10/04/12 0610 10/05/12 1000  WBC 7.4  < > 21.7* 37.6* 19.9* 10.1 11.7*  NEUTROABS 6.6  --  19.6* 33.8* 17.7*  --  9.7*  HGB 5.7*  < > 5.5* 5.5* 5.2* 6.2* 7.1*  HCT 23.1*  < > 22.4* 22.6* 21.8* 24.0* 26.7*  MCV 61.6*  < > 61.4* 62.8* 62.6* 64.9* 64.5*  PLT 202  < > 216 PLATELET CLUMPS NOTED ON SMEAR, COUNT APPEARS ADEQUATE 222 181 231  < > = values in this interval not displayed. Cardiac Enzymes:  Recent Labs Lab 09/30/12 1911 09/30/12 2334 10/01/12 0500 10/04/12 1323   TROPONINI <0.30 <0.30 <0.30 <0.30   BNP (last 3 results)  Recent Labs  10/05/12 0455  PROBNP 2326.0*   CBG:  Recent Labs Lab 10/03/12 0811 10/03/12 1129 10/03/12 1554 10/04/12 0009 10/04/12 0804  GLUCAP 89 141* 93 101* 86    Recent Results (from the past 240 hour(s))  URINE CULTURE     Status: None   Collection Time    10/01/12  9:04 PM      Result Value Range Status   Specimen Description URINE, CLEAN CATCH   Final   Special Requests NONE   Final   Culture  Setup Time 10/02/2012 02:48   Final   Colony Count NO GROWTH   Final   Culture NO GROWTH   Final   Report Status 10/03/2012 FINAL   Final  SURGICAL PCR SCREEN     Status: None   Collection Time    10/01/12 11:28 PM      Result Value Range Status   MRSA, PCR NEGATIVE  NEGATIVE Final   Staphylococcus aureus NEGATIVE  NEGATIVE Final   Comment:            The Xpert SA Assay (FDA     approved for NASAL specimens     in patients over 70 years of age),     is one component of     a comprehensive surveillance     program.  Test performance has     been validated by [employer] for patients greater     than or equal to 68 year old.     It is not intended     to diagnose infection nor to     guide or monitor treatment.  CULTURE, ROUTINE-ABSCESS     Status: None   Collection Time    10/02/12  1:55 PM      Result Value Range Status   Specimen Description ABSCESS GALL BLADDER   Final   Special Requests NONE   Final   Gram Stain     Final   Value: NO WBC SEEN     NO SQUAMOUS EPITHELIAL CELLS SEEN     MODERATE GRAM POSITIVE RODS     MODERATE GRAM POSITIVE COCCI     IN PAIRS FEW GRAM NEGATIVE RODS   Culture     Final   Value: MULTIPLE ORGANISMS PRESENT, NONE PREDOMINANT     Note: NO STAPHYLOCOCCUS AUREUS ISOLATED NO GROUP A STREP (S.PYOGENES) ISOLATED   Report Status 10/05/2012 FINAL  Final  ANAEROBIC CULTURE     Status: None   Collection Time    10/02/12  1:55 PM      Result Value Range Status    Specimen Description ABSCESS GALL BLADDER   Final   Special Requests NONE   Final   Gram Stain     Final   Value: NO WBC SEEN     NO SQUAMOUS EPITHELIAL CELLS SEEN     MODERATE GRAM POSITIVE RODS     MODERATE GRAM POSITIVE COCCI     IN PAIRS FEW GRAM NEGATIVE RODS   Culture     Final   Value: NO ANAEROBES ISOLATED; CULTURE IN PROGRESS FOR 5 DAYS   Report Status PENDING   Incomplete  MRSA PCR SCREENING     Status: None   Collection Time    10/02/12  2:09 PM      Result Value Range Status   MRSA by PCR NEGATIVE  NEGATIVE Final   Comment:            The GeneXpert MRSA Assay (FDA     approved for NASAL specimens     only), is one component of a     comprehensive MRSA colonization     surveillance program. It is not     intended to diagnose MRSA     infection nor to guide or     monitor treatment for     MRSA infections.  CULTURE, BLOOD (ROUTINE X 2)     Status: None   Collection Time    10/02/12  2:30 PM      Result Value Range Status   Specimen Description BLOOD RIGHT ARM   Final   Special Requests BOTTLES DRAWN AEROBIC AND ANAEROBIC 10CC   Final   Culture  Setup Time 10/02/2012 21:33   Final   Culture     Final   Value:        BLOOD CULTURE RECEIVED NO GROWTH TO DATE CULTURE WILL BE HELD FOR 5 DAYS BEFORE ISSUING A FINAL NEGATIVE REPORT   Report Status PENDING   Incomplete  CULTURE, BLOOD (ROUTINE X 2)     Status: None   Collection Time    10/02/12  2:40 PM      Result Value Range Status   Specimen Description BLOOD RIGHT ARM   Final   Special Requests BOTTLES DRAWN AEROBIC AND ANAEROBIC 10CC   Final   Culture  Setup Time 10/02/2012 21:33   Final   Culture     Final   Value:        BLOOD CULTURE RECEIVED NO GROWTH TO DATE CULTURE WILL BE HELD FOR 5 DAYS BEFORE ISSUING A FINAL NEGATIVE REPORT   Report Status PENDING   Incomplete     Studies: Dg Chest Port 1 View  10/04/2012   *RADIOLOGY REPORT*  Clinical Data: Increased shortness of breath, evaluate for possible fluid  overload  PORTABLE CHEST - 1 VIEW  Comparison: Portable chest x-ray of 10/02/2012  Findings: The lungs appear slightly better aerated.  Mild cardiomegaly is stable.  No definite pulmonary vascular congestion is currently seen.  There are degenerative changes throughout the thoracic spine.  IMPRESSION: Slightly better aeration.  Stable cardiomegaly.  No definite pulmonary vascular congestion.   Original Report Authenticated By: Dwyane Dee, M.D.    Scheduled Meds: . antiseptic oral rinse  15 mL Mouth Rinse q12n4p  . bisoprolol  10 mg Oral Daily  . ciprofloxacin  500 mg Oral BID  .  docusate sodium  100 mg Oral Daily  . enoxaparin (LOVENOX) injection  40 mg Subcutaneous Q24H  . estrogens (conjugated)  0.625 mg Oral BID  . ketorolac  30 mg Intravenous Once  . lisinopril  10 mg Oral Daily  . OxyCODONE  20 mg Oral Custom  . pantoprazole  40 mg Oral Q1200  . polyethylene glycol  17 g Oral Daily  . potassium chloride  40 mEq Oral BID  . senna  1 tablet Oral QHS  . thyroid  150 mg Oral QAC breakfast   Continuous Infusions:   Principal Problem:   Benign hypertensive heart disease without heart failure Active Problems:   Hypothyroidism   Polycythemia vera(238.4)   Fibromyalgia   CAD (coronary artery disease)   Cholelithiases   Unspecified constipation   Septic shock    Time spent: 35    [facility], Ziza Hastings  [facility] Pager [phone]. If 7PM-7AM, please contact night-coverage at [email or web address], password [facility] 10/05/2012, 12:37 PM  LOS: 5 days

## 2012-10-05 NOTE — Progress Notes (Signed)
Seen for social call. Patient doing well. 2D echo yesterday was unremarkable.  Anticipate discharge soon.

## 2012-10-05 NOTE — Consult Note (Signed)
Referring Physician: Dr. Violeta Gelinas    Chief Complaint: New-onset left cerebral infarctions.  HPI: [name] is an 67 y.o. female with a history of polycythemia, hypertension and hyperlipidemia who was admitted on 09/30/2012 with acute cholecystitis. She status post drain placement. She experienced sudden onset of weakness involving her right hand as well as change in speech at around noon on 10/04/2012. 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 MRA showed markedly diminished flow and possible obstruction of left ICA. Patient has been taking aspirin and Plavix since admission. NIH stroke score was 2.  LSN: Prednisone on 10/04/2012 tPA Given: No: Beyond time window for treatment consideration MRankin: 2  Past Medical History  Diagnosis Date  . 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 Fibromyalgia   . Hypothyroidism   . Essential hypertension   . Postmenopausal state     on hormone replacement therapy  . Aortic valve sclerosis     by echocardiogram 12/04/2009  . PAC (premature atrial contraction)     history of  . Coronary artery disease   . Hyperlipidemia   . History of echocardiogram 12/04/2009    showed mild ventricle hypertrophy and normal EF at 55-60% -- mild aortic valve sclerosis  -- trace mitral regurgitation -- slightly pulmonary artery pressure at 47  --  [name], M.D.   . Fatigue     Family History  Problem Relation Age of Onset  . Heart attack Father   . Coronary artery disease Mother     had aortic valve replacement     Medications:  I have reviewed the patient's current medications. Scheduled: . antiseptic oral rinse  15 mL Mouth Rinse q12n4p  . aspirin  325 mg Oral Daily  . bisoprolol  10 mg Oral Daily  . ciprofloxacin  500 mg Oral BID  . docusate sodium  100 mg Oral Daily  . enoxaparin (LOVENOX) injection  40 mg Subcutaneous Q24H  . estrogens (conjugated)  0.625 mg Oral BID  . ketorolac  30 mg Intravenous Once   . lisinopril  10 mg Oral Daily  . OxyCODONE  20 mg Oral Custom  . pantoprazole  40 mg Oral Q1200  . polyethylene glycol  17 g Oral Daily  . potassium chloride  40 mEq Oral BID  . senna  1 tablet Oral QHS  . thyroid  150 mg Oral QAC breakfast   Continuous: . 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 ENT ROS: negative for - epistaxis, nasal discharge, oral lesions, sore throat, tinnitus or vertigo Allergy and Immunology ROS: negative for - hives or itchy/watery eyes Hematological and Lymphatic ROS: negative for - bleeding problems, bruising or swollen lymph nodes Endocrine ROS: negative for - galactorrhea, hair pattern changes, polydipsia/polyuria or temperature intolerance Respiratory ROS: negative for - cough, hemoptysis, shortness of breath or wheezing Cardiovascular ROS: negative for - chest pain, dyspnea on exertion, edema or irregular heartbeat Gastrointestinal ROS: Acute cholecystitis with drain placement Genito-Urinary ROS: negative for - dysuria, hematuria, incontinence or urinary frequency/urgency Musculoskeletal ROS: negative for - joint swelling or muscular weakness Neurological ROS: as noted in HPI Dermatological  ROS: negative for rash and skin lesion changes  Physical Examination: Blood pressure 134/52, pulse 115, temperature 98.6 F (37 C), temperature source Oral, resp. rate 19, SpO2 100.00%.  Neurologic Examination: Mental Status: Alert, oriented, thought content appropriate.  Speech fluent  but slow without evidence of aphasia. Able to follow commands without difficulty. Cranial Nerves: II-Visual fields were normal. III/IV/VI-Pupils were equal and reacted. Extraocular movements were full and conjugate.    V/VII-no facial numbness and no facial weakness. VIII-normal. X-no significant dysarthria; symmetrical palatal movement. Motor: Mild drift of right upper extremity against gravity; moderately severe right hand weakness; motor exam otherwise unremarkable. Sensory: Normal throughout. Deep Tendon Reflexes: Trace to 1+ and symmetric. Plantars: Mute bilaterally Cerebellar: Moderately impaired with use of right upper extremity.  Mr [name] Head Wo Contrast  10/05/2012   *RADIOLOGY REPORT*  Clinical Data:  Right arm weakness and abnormal speech.  MRI HEAD WITHOUT CONTRAST MRA HEAD WITHOUT CONTRAST  Technique:  Multiplanar, multiecho pulse sequences of the brain and surrounding structures were obtained without intravenous contrast. Angiographic images of the head were obtained using MRA technique without contrast.  Comparison:  MRI brain at [facility] 12/15/2011.  MRI HEAD  Findings:  Scattered areas of restricted diffusion are evident within a watershed distribution between the left ACA and MCA territories.  T2 hyperintensities are associated with the areas of acute infarction.  Mild generalized atrophy is otherwise stable.  No hemorrhage or mass lesion is evident.  The ventricles are of normal size.  A remote lacunar infarct of the right caudate head is stable.  Marrow signal in the upper cervical spine and calvarium is decreased relative to the prior study.  No discrete lesions are evident. Abnormal signal is present within the left internal carotid arteries suggesting occlusion or high-grade stenosis.  The flow is present in the right internal carotid artery and the posterior circulation.  The globes and orbits are intact.  Minimal mucosal thickening is present throughout the paranasal  sinuses.  No fluid levels are present.  The mastoid air cells are clear.  IMPRESSION:  1. Scattered acute / subacute non hemorrhagic infarcts within a watershed territory between the left ACA and MCA distributions. 2.  Occluded or extremely slow flow within the left internal carotid artery at the skull base. 3.  Stable atrophy and minimal white matter disease otherwise. 4.  Decreased T1 marrow signal within the upper cervical spine and calvarium.  This is compatible with the patient's known anemia.  MRA HEAD  Findings: Markedly diminished signal is evident within the left internal carotid artery, particularly within the cavernous segment. There is diminished signal in the left A1 segment compared the right.  The left M1 segment is less robust than on the right with more pronounced segmental attenuation of distal left MCA branch vessels.  The right vertebral artery is slightly dominant to the left.  The PICA origins are visualized and normal.  The basilar artery is within normal limits.  Both posterior cerebral arteries originate from the basilar tip.  MRA signal is evident into much more distal left PCA branch vessels.  There is attenuation of right-sided PCA branch vessels.  IMPRESSION:  1.  Markedly diminished signal within the left internal carotid artery and branch vessels.  This corresponds to the area of infarction and raises concern for mil proximal stenosis. 2.  There may be a high-grade stenosis within the cavernous segment. 3.  Prominent left PCA branch vessels.  This is likely related to attempts at collateral flow.  These results were called by telephone on 10/05/2012 at 08:05 p.m. to Tana Conch, R.N., [facility] 6NT, who verbally acknowledged these results.   Original Report Authenticated By: Marin Roberts, M.D.   Mr Brain Wo Contrast  10/05/2012   *RADIOLOGY REPORT*  Clinical Data:  Right arm weakness and abnormal speech.  MRI HEAD WITHOUT CONTRAST MRA HEAD WITHOUT CONTRAST  Technique:   Multiplanar, multiecho pulse sequences of the brain and surrounding structures were obtained without intravenous contrast. Angiographic images of the head were obtained using MRA technique without contrast.  Comparison:  MRI brain at [facility] 12/15/2011.  MRI HEAD  Findings:  Scattered areas of restricted diffusion are evident within a watershed distribution between the left ACA and MCA territories.  T2 hyperintensities are associated with the areas of acute infarction.  Mild generalized atrophy is otherwise stable.  No hemorrhage or mass lesion is evident.  The ventricles are of normal size.  A remote lacunar infarct of the right caudate head is stable.  Marrow signal in the upper cervical spine and calvarium is decreased relative to the prior study.  No discrete lesions are evident. Abnormal signal is present within the left internal carotid arteries suggesting occlusion or high-grade stenosis.  The flow is present in the right internal carotid artery and the posterior circulation.  The globes and orbits are intact.  Minimal mucosal thickening is present throughout the paranasal sinuses.  No fluid levels are present.  The mastoid air cells are clear.  IMPRESSION:  1. Scattered acute / subacute non hemorrhagic infarcts within a watershed territory between the left ACA and MCA distributions. 2.  Occluded or extremely slow flow within the left internal carotid artery at the skull base. 3.  Stable atrophy and minimal white matter disease otherwise. 4.  Decreased T1 marrow signal within the upper cervical spine and calvarium.  This is compatible with the patient's known anemia.  MRA HEAD  Findings: Markedly diminished signal is evident within the left internal carotid artery, particularly within the cavernous segment. There is diminished signal in the left A1 segment compared the right.  The left M1 segment is less robust than on the right with more pronounced segmental attenuation of distal left MCA branch  vessels.  The right vertebral artery is slightly dominant to the left.  The PICA origins are visualized and normal.  The basilar artery is within normal limits.  Both posterior cerebral arteries originate from the basilar tip.  MRA signal is evident into much more distal left PCA branch vessels.  There is attenuation of right-sided PCA branch vessels.  IMPRESSION:  1.  Markedly diminished signal within the left internal carotid artery and branch vessels.  This corresponds to the area of infarction and raises concern for mil proximal stenosis. 2.  There may be a high-grade stenosis within the cavernous segment. 3.  Prominent left PCA branch vessels.  This is likely related to attempts at collateral flow.  These results were called by telephone on 10/05/2012 at 08:05 p.m. to Tana Conch, R.N., [facility] 6NT, who verbally acknowledged these results.   Original Report Authenticated By: Marin Roberts, M.D.   Dg Chest Port 1 View  10/04/2012   *RADIOLOGY REPORT*  Clinical Data: Increased shortness of breath, evaluate for possible fluid overload  PORTABLE CHEST - 1 VIEW  Comparison: Portable chest x-ray of 10/02/2012  Findings: The lungs appear slightly better aerated.  Mild cardiomegaly is stable.  No definite pulmonary vascular congestion is currently seen.  There are degenerative changes throughout  the thoracic spine.  IMPRESSION: Slightly better aeration.  Stable cardiomegaly.  No definite pulmonary vascular congestion.   Original Report Authenticated By: Dwyane Dee, M.D.    Assessment: 67 y.o. 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  Stroke Risk Factors - hyperlipidemia and hypertension  Plan: 1. HgbA1c, fasting lipid panel 2. PT consult, OT consult, Speech consult 3. Carotid dopplers 4. Prophylactic therapy-Antiplatelet med: Plavix 5. Risk factor modification 6. Telemetry monitoring   C.R.  Roseanne Reno, MD [facility] [phone] 10/05/2012, 9:25 PM

## 2012-10-05 NOTE — Progress Notes (Signed)
In room to update patient and husband and found husband now feeding patient.  He states, "now she is not using her left arm."  (which she was just using 5 minutes prior to feed herself grapes.)  Pt swallowing food without any s/s of dysphagia.  Will continue to monitor and follow up as needed.

## 2012-10-05 NOTE — Progress Notes (Signed)
Dr. Benjamine Mola in room assessing pt at this time.

## 2012-10-06 DIAGNOSIS — I6350 Cerebral infarction due to unspecified occlusion or stenosis of unspecified cerebral artery: Secondary | ICD-10-CM

## 2012-10-06 DIAGNOSIS — I119 Hypertensive heart disease without heart failure: Secondary | ICD-10-CM

## 2012-10-06 LAB — BASIC METABOLIC PANEL
GFR calc Af Amer: >90 mL/min (ref >90)
GFR calc non Af Amer: >90 mL/min (ref >90)
Potassium: 4.4 mEq/L (ref 3.5–5.1)
Sodium: 136 mEq/L (ref 135–145)

## 2012-10-06 LAB — CBC
MCHC: 26.2 g/dL — ABNORMAL LOW (ref 30.0–36.0)
RDW: 25.8 % — ABNORMAL HIGH (ref 11.5–15.5)
WBC: 8.6 10*3/uL (ref 4.0–10.5)

## 2012-10-06 LAB — VITAMIN B12: Vitamin B-12: 1045 pg/mL — ABNORMAL HIGH (ref 211–911)

## 2012-10-06 MED ORDER — POLYETHYLENE GLYCOL 3350 17 G PO PACK
17.0000 g | PACK | Freq: Three times a day (TID) | ORAL | Status: DC
  Administered 2012-10-06 – 2012-10-11 (×11): 17 g via ORAL
  Filled 2012-10-06 (×21): qty 1

## 2012-10-06 MED ORDER — FUROSEMIDE 40 MG PO TABS
40.0000 mg | ORAL_TABLET | Freq: Every day | ORAL | Status: DC
  Administered 2012-10-06 – 2012-10-12 (×6): 40 mg via ORAL
  Filled 2012-10-06 (×9): qty 1

## 2012-10-06 MED ORDER — SENNA 8.6 MG PO TABS
1.0000 | ORAL_TABLET | Freq: Three times a day (TID) | ORAL | Status: DC
  Administered 2012-10-06 – 2012-10-12 (×17): 8.6 mg via ORAL
  Filled 2012-10-06 (×24): qty 1

## 2012-10-06 MED ORDER — CLOPIDOGREL BISULFATE 75 MG PO TABS
75.0000 mg | ORAL_TABLET | Freq: Every day | ORAL | Status: DC
  Administered 2012-10-06 – 2012-10-09 (×4): 75 mg via ORAL
  Filled 2012-10-06 (×7): qty 1

## 2012-10-06 MED ORDER — MINERAL OIL PO OIL
TOPICAL_OIL | Freq: Every day | ORAL | Status: DC
  Administered 2012-10-06: 20:00:00 via ORAL
  Administered 2012-10-07 – 2012-10-08 (×2): 60 mL via ORAL
  Administered 2012-10-09 – 2012-10-10 (×2): 1 mL via ORAL
  Administered 2012-10-11: 22:00:00 via ORAL
  Filled 2012-10-06 (×2): qty 60
  Filled 2012-10-06 (×2): qty 30
  Filled 2012-10-06: qty 60
  Filled 2012-10-06: qty 30
  Filled 2012-10-06: qty 60
  Filled 2012-10-06: qty 30
  Filled 2012-10-06 (×2): qty 60
  Filled 2012-10-06: qty 30

## 2012-10-06 MED ORDER — SORBITOL 70 % SOLN
960.0000 mL | TOPICAL_OIL | Freq: Once | ORAL | Status: AC
  Administered 2012-10-06: 960 mL via RECTAL
  Filled 2012-10-06: qty 240

## 2012-10-06 MED ORDER — OXYCODONE HCL ER 10 MG PO T12A: 10.0000 mg | EXTENDED_RELEASE_TABLET | Freq: Three times a day (TID) | ORAL | Status: DC

## 2012-10-06 NOTE — Progress Notes (Signed)
VASCULAR LAB PRELIMINARY  PRELIMINARY  PRELIMINARY  PRELIMINARY  Carotid duplex completed.    Preliminary report:  Right ::  40-59% proximal internal carotid artery stenosis with turbulent flow..  Left -  There is a 60% to 79% highest end of scale ICA stenosis in the bulb with turbulent flow throughout the ICA. Bilateral - Vertebral artery flow is antegrade.  Eular Panek, RVS 10/06/2012, 9:33 PM

## 2012-10-06 NOTE — Progress Notes (Signed)
Stroke Team Progress Note  HISTORY [name] is an 67 y.o. female with a history of polycythemia, hypertension and hyperlipidemia who was admitted on 09/30/2012 with acute cholecystitis. She status post drain placement. She experienced sudden onset of weakness involving her right hand as well as change in speech at around noon on 10/04/2012 in the hospital. MRI of her brain performed 10/05/2012 showed left MCA/ACA watershed infarcts. MRA showed markedly diminished flow and possible obstruction of left ICA. Patient has been taking aspirin and Plavix since admission. NIH stroke score was 2. Patient was not a TPA candidate secondary to recent drain placement and lack of recognition of acute stroke.  SUBJECTIVE Her husbnad is at the bedside.  Overall she feels her condition is stable - she is having difficulty getting her words out and finding the right words. Right had weakness noted yesterday per her.  OBJECTIVE Most recent Vital Signs: Filed Vitals:   10/05/12 0631 10/05/12 1300 10/05/12 2030 10/06/12 0622  BP: 136/90 110/50 134/52 130/62  Pulse: 91 57 115 84  Temp: 98.9 F (37.2 C) 98.6 F (37 C)  98.1 F (36.7 C)  TempSrc: Oral Oral  Oral  Resp: 18 19  17   SpO2: 92% 91% 100% 100%   CBG (last 3)   Recent Labs  10/03/12 1554 10/04/12 0009 10/04/12 0804  GLUCAP 93 101* 86    IV Fluid Intake:   . sodium chloride 75 mL/hr (10/06/12 0902)    MEDICATIONS  . antiseptic oral rinse  15 mL Mouth Rinse q12n4p  . aspirin  325 mg Oral Daily  . bisoprolol  10 mg Oral Daily  . ciprofloxacin  500 mg Oral BID  . docusate sodium  100 mg Oral Daily  . enoxaparin (LOVENOX) injection  40 mg Subcutaneous Q24H  . estrogens (conjugated)  0.625 mg Oral BID  . furosemide  40 mg Oral Daily  . ketorolac  30 mg Intravenous Once  . lisinopril  10 mg Oral Daily  . mineral oil   Oral Q2000  . OxyCODONE  20 mg Oral Custom  . pantoprazole  40 mg Oral Q1200  . polyethylene glycol  17 g Oral TID  .  potassium chloride  40 mEq Oral BID  . senna  1 tablet Oral TID  . thyroid  150 mg Oral QAC breakfast   PRN:  acetaminophen, albuterol-ipratropium, ALPRAZolam, cyclobenzaprine, diphenhydrAMINE, ondansetron, oxyCODONE, temazepam  Diet:  Fat Restricted thin liquids Activity:  Ambulate in hall DVT Prophylaxis:  Lovenox 40 mg sq daily   CLINICALLY SIGNIFICANT STUDIES Basic Metabolic Panel:  Recent Labs Lab 10/02/12 0505 10/03/12 0400  10/04/12 1309 10/05/12 1000  NA 134* 135  < > 135 134*  K 4.6 3.9  < > 3.8 3.9  CL 99 102  < > 103 97  CO2 24 24  < > 23 27  GLUCOSE 110* 127*  < > 90 134*  BUN 13 15  < > 8 8  CREATININE 1.02 0.80  < > 0.57 0.60  CALCIUM 8.7 8.6  < > 8.2* 8.6  MG 2.1 2.3  --   --   --   < > = values in this interval not displayed. Liver Function Tests:  Recent Labs Lab 10/03/12 0400 10/04/12 1309  AST 13 13  ALT 9 9  ALKPHOS 61 46  BILITOT 0.4 0.4  PROT 6.0 5.6*  ALBUMIN 2.7* 2.5*   CBC:  Recent Labs Lab 10/03/12 0400 10/04/12 0610 10/05/12 1000  WBC 19.9* 10.1  11.7*  NEUTROABS 17.7*  --  9.7*  HGB 5.2* 6.2* 7.1*  HCT 21.8* 24.0* 26.7*  MCV 62.6* 64.9* 64.5*  PLT 222 181 231   Coagulation:  Recent Labs Lab 10/02/12 1021  LABPROT 18.1*  INR 1.54*   Cardiac Enzymes:  Recent Labs Lab 09/30/12 2334 10/01/12 0500 10/04/12 1323  TROPONINI <0.30 <0.30 <0.30   Urinalysis:  Recent Labs Lab 10/01/12 2104 10/05/12 1638  COLORURINE AMBER* YELLOW  LABSPEC 1.018 1.013  PHURINE 5.5 5.0  GLUCOSEU NEGATIVE NEGATIVE  HGBUR TRACE* NEGATIVE  BILIRUBINUR NEGATIVE NEGATIVE  KETONESUR NEGATIVE NEGATIVE  PROTEINUR 30* NEGATIVE  UROBILINOGEN 0.2 0.2  NITRITE NEGATIVE NEGATIVE  LEUKOCYTESUR LARGE* TRACE*   Lipid Panel    Component Value Date/Time   CHOL 112 11/10/2011 1001   TRIG 128.0 11/10/2011 1001   HDL 26.70* 11/10/2011 1001   CHOLHDL 4 11/10/2011 1001   VLDL 25.6 11/10/2011 1001   LDLCALC 60 11/10/2011 1001   HgbA1C  No results found for  this basename: HGBA1C    Urine Drug Screen:   No results found for this basename: labopia, cocainscrnur, labbenz, amphetmu, thcu, labbarb    Alcohol Level: No results found for this basename: ETH,  in the last 168 hours   CT of the brain    MRI of the brain  10/05/2012    1. Scattered acute / subacute non hemorrhagic infarcts within a watershed territory between the left ACA and MCA distributions. 2.  Occluded or extremely slow flow within the left internal carotid artery at the skull base. 3.  Stable atrophy and minimal white matter disease otherwise. 4.  Decreased T1 marrow signal within the upper cervical spine and calvarium.  This is compatible with the patient's known anemia.   MRA of the brain  10/05/2012    1.  Markedly diminished signal within the left internal carotid artery and branch vessels.  This corresponds to the area of infarction and raises concern for mil proximal stenosis. 2.  There may be a high-grade stenosis within the cavernous segment. 3.  Prominent left PCA branch vessels.  This is likely related to attempts at collateral flow.    2D Echocardiogram  EF 65-70% with no source of embolus.   Carotid Doppler    CXR  10/04/2012    Slightly better aeration.  Stable cardiomegaly.  No definite pulmonary vascular congestion.   EKG  sinus bradycardia.   Therapy Recommendations home health PT  Physical Exam   Pleasant middle aged Caucasian lady not in distress.Awake alert. Afebrile. Head is nontraumatic. Neck is supple without bruit. Hearing is normal. Cardiac exam no murmur or gallop. Lungs are clear to auscultation. Distal pulses are well felt. Neurological Exam : Awake alert oriented x3. Slightly hesitant speech with occasional word finding difficulties. Able to name, repeat and comprehend quite well. No dysarthria. Extraocular moments are full range without nystagmus. Visual acuity and fields appear normal. Face is symmetric without weakness. Tongue is midline. Motor system  exam reveals no upper or lower eczema to drift but fine finger movements are diminished on the right. Orbits left-to-right approximately. Minimum weakness of right grip and foot tapping on the right. Sensation is intact coordination is slow but accurate. Gait was not tested. ASSESSMENT [name] is a 67 y.o. female who developed right arm weakness and difficulty with speech while hospitalized for a gallbladder attack. 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  While polycythemia can cause stroke, given the distribution of her infarct, it is most likely due to hypoperfusion in the setting of carotid stenosis; it may have been worsened by hypotension. Carotid dopplers will confirm or refute our suspicions.  On aspirin 325 mg orally every day and clopidogrel 75 mg orally every day prior to admission. Now on aspirin 325 mg orally every day for secondary stroke prevention. Patient with resultant right hemiparesis and expressive aphasia. Work up underway.   Acute cholecystitis  Polycythemia vera Hypertension CAD  Hyperlipidemia, LDL 60, on no statin PTA, now on no statin, at goal LDL < 100   Hospital day # 6  TREATMENT/PLAN  Continue aspirin 325 mg orally every day and clopidogrel 75 mg orally every day for secondary stroke prevention as prior to admission. I do not bebeive blood letting for polycythemia will help as cause of stroke is likely hemodynamic from proximal carotid stenosis which may need revascularization.  F/u stroke work up  Of note, patient has been seen by a [facility] neurologist within the past year - Loyal Gambler - [phone]  Annie Main, MSN, RN, ANVP-BC, ANP-BC, [facility] Pager: [phone] 10/06/2012 11:33 AM  I have personally obtained a history, examined the patient, evaluated imaging results, and formulated the assessment and plan of care. I  agree with the above.  Delia Heady, MD

## 2012-10-06 NOTE — Progress Notes (Signed)
Shockingly enough, [name] did have a CVA yesterday. This, goes to show how her blood is even despite a low hemoglobin. I think that we will have to phlebotomize her. She's hemodynamically stable. She's not going to have surgery. There is no sepsis. She really needs to keep the hemoglobin below 6.  Neurology has seen her. MRI showed areas of acute infarct. There may be some issues with carotid stenosis. I don't know if she needs any type of angiogram to sort this out. I don't know she's any type of angioplasty if there is a stenotic lesion.  From a gallbladder point of view, she seems to be doing okay. She has the cholecystotomy tube.  She definitely will need rehabilitation. I suspect she likely will need inpatient rehabilitation. She [place]. It would be hard for her to do any type of outpatient rehabilitation.  Vital signs are stable. Blood pressure 130/62. She is afebrile. Lungs are clear. Cardiac exam regular in rhythm with no murmurs rubs or bruits. Abdomen is soft. She has a cholecystotomy tube in the right upper quadrant. Bowel sounds are slightly decreased. She does have some weakness in the right arm.  Again, [name] is shown [place] her hyper coagulability from her polycythemia. I will defer to neurology as to how they feel this needs to be managed.  We will continue to pray for her.  Pete E.  1Thes 5:16-18

## 2012-10-06 NOTE — Progress Notes (Signed)
Subjective: She had stroke on mr overnight, from gb standpoint she is doing ok  Objective: Vital signs in last 24 hours: Temp:  [98.1 F (36.7 C)-98.6 F (37 C)] 98.1 F (36.7 C) (07/11 0622) Pulse Rate:  [57-115] 84 (07/11 0622) Resp:  [17-19] 17 (07/11 0622) BP: (110-134)/(50-62) 130/62 mmHg (07/11 0622) SpO2:  [91 %-100 %] 100 % (07/11 0622) Last BM Date: 09/29/12  Intake/Output from previous day: 07/10 0701 - 07/11 0700 In: 30  Out: 725 [Urine:625; Drains:100] Intake/Output this shift: Total I/O In: 120 [P.O.:120] Out: -   GI: soft, drain in place functional  Lab Results:   Recent Labs  10/04/12 0610 10/05/12 1000  WBC 10.1 11.7*  HGB 6.2* 7.1*  HCT 24.0* 26.7*  PLT 181 231   BMET  Recent Labs  10/04/12 1309 10/05/12 1000  NA 135 134*  K 3.8 3.9  CL 103 97  CO2 23 27  GLUCOSE 90 134*  BUN 8 8  CREATININE 0.57 0.60  CALCIUM 8.2* 8.6   PT/INR No results found for this basename: LABPROT, INR,  in the last 72 hours ABG No results found for this basename: PHART, PCO2, PO2, HCO3,  in the last 72 hours  Studies/Results: Mr Shirlee Latch Wo Contrast  10/05/2012   *RADIOLOGY REPORT*  Clinical Data:  Right arm weakness and abnormal speech.  MRI HEAD WITHOUT CONTRAST MRA HEAD WITHOUT CONTRAST  Technique:  Multiplanar, multiecho pulse sequences of the brain and surrounding structures were obtained without intravenous contrast. Angiographic images of the head were obtained using MRA technique without contrast.  Comparison:  MRI brain at [facility] 12/15/2011.  MRI HEAD  Findings:  Scattered areas of restricted diffusion are evident within a watershed distribution between the left ACA and MCA territories.  T2 hyperintensities are associated with the areas of acute infarction.  Mild generalized atrophy is otherwise stable.  No hemorrhage or mass lesion is evident.  The ventricles are of normal size.  A remote lacunar infarct of the right caudate head is stable.   Marrow signal in the upper cervical spine and calvarium is decreased relative to the prior study.  No discrete lesions are evident. Abnormal signal is present within the left internal carotid arteries suggesting occlusion or high-grade stenosis.  The flow is present in the right internal carotid artery and the posterior circulation.  The globes and orbits are intact.  Minimal mucosal thickening is present throughout the paranasal sinuses.  No fluid levels are present.  The mastoid air cells are clear.  IMPRESSION:  1. Scattered acute / subacute non hemorrhagic infarcts within a watershed territory between the left ACA and MCA distributions. 2.  Occluded or extremely slow flow within the left internal carotid artery at the skull base. 3.  Stable atrophy and minimal white matter disease otherwise. 4.  Decreased T1 marrow signal within the upper cervical spine and calvarium.  This is compatible with the patient's known anemia.  MRA HEAD  Findings: Markedly diminished signal is evident within the left internal carotid artery, particularly within the cavernous segment. There is diminished signal in the left A1 segment compared the right.  The left M1 segment is less robust than on the right with more pronounced segmental attenuation of distal left MCA branch vessels.  The right vertebral artery is slightly dominant to the left.  The PICA origins are visualized and normal.  The basilar artery is within normal limits.  Both posterior cerebral arteries originate from the basilar tip.  MRA signal  is evident into much more distal left PCA branch vessels.  There is attenuation of right-sided PCA branch vessels.  IMPRESSION:  1.  Markedly diminished signal within the left internal carotid artery and branch vessels.  This corresponds to the area of infarction and raises concern for mil proximal stenosis. 2.  There may be a high-grade stenosis within the cavernous segment. 3.  Prominent left PCA branch vessels.  This is likely  related to attempts at collateral flow.  These results were called by telephone on 10/05/2012 at 08:05 p.m. to Tana Conch, R.N., [facility] 6NT, who verbally acknowledged these results.   Original Report Authenticated By: Marin Roberts, M.D.   Mr Brain Wo Contrast  10/05/2012   *RADIOLOGY REPORT*  Clinical Data:  Right arm weakness and abnormal speech.  MRI HEAD WITHOUT CONTRAST MRA HEAD WITHOUT CONTRAST  Technique:  Multiplanar, multiecho pulse sequences of the brain and surrounding structures were obtained without intravenous contrast. Angiographic images of the head were obtained using MRA technique without contrast.  Comparison:  MRI brain at [facility] 12/15/2011.  MRI HEAD  Findings:  Scattered areas of restricted diffusion are evident within a watershed distribution between the left ACA and MCA territories.  T2 hyperintensities are associated with the areas of acute infarction.  Mild generalized atrophy is otherwise stable.  No hemorrhage or mass lesion is evident.  The ventricles are of normal size.  A remote lacunar infarct of the right caudate head is stable.  Marrow signal in the upper cervical spine and calvarium is decreased relative to the prior study.  No discrete lesions are evident. Abnormal signal is present within the left internal carotid arteries suggesting occlusion or high-grade stenosis.  The flow is present in the right internal carotid artery and the posterior circulation.  The globes and orbits are intact.  Minimal mucosal thickening is present throughout the paranasal sinuses.  No fluid levels are present.  The mastoid air cells are clear.  IMPRESSION:  1. Scattered acute / subacute non hemorrhagic infarcts within a watershed territory between the left ACA and MCA distributions. 2.  Occluded or extremely slow flow within the left internal carotid artery at the skull base. 3.  Stable atrophy and minimal white matter disease otherwise. 4.  Decreased T1 marrow signal within the  upper cervical spine and calvarium.  This is compatible with the patient's known anemia.  MRA HEAD  Findings: Markedly diminished signal is evident within the left internal carotid artery, particularly within the cavernous segment. There is diminished signal in the left A1 segment compared the right.  The left M1 segment is less robust than on the right with more pronounced segmental attenuation of distal left MCA branch vessels.  The right vertebral artery is slightly dominant to the left.  The PICA origins are visualized and normal.  The basilar artery is within normal limits.  Both posterior cerebral arteries originate from the basilar tip.  MRA signal is evident into much more distal left PCA branch vessels.  There is attenuation of right-sided PCA branch vessels.  IMPRESSION:  1.  Markedly diminished signal within the left internal carotid artery and branch vessels.  This corresponds to the area of infarction and raises concern for mil proximal stenosis. 2.  There may be a high-grade stenosis within the cavernous segment. 3.  Prominent left PCA branch vessels.  This is likely related to attempts at collateral flow.  These results were called by telephone on 10/05/2012 at 08:05 p.m. to Bjorn Loser., [facility]  6NT, who verbally acknowledged these results.   Original Report Authenticated By: Marin Roberts, M.D.   Dg Chest Port 1 View  10/04/2012   *RADIOLOGY REPORT*  Clinical Data: Increased shortness of breath, evaluate for possible fluid overload  PORTABLE CHEST - 1 VIEW  Comparison: Portable chest x-ray of 10/02/2012  Findings: The lungs appear slightly better aerated.  Mild cardiomegaly is stable.  No definite pulmonary vascular congestion is currently seen.  There are degenerative changes throughout the thoracic spine.  IMPRESSION: Slightly better aeration.  Stable cardiomegaly.  No definite pulmonary vascular congestion.   Original Report Authenticated By: Dwyane Dee, M.D.     Anti-infectives: Anti-infectives   Start     Dose/Rate Route Frequency Ordered Stop   10/04/12 1300  ciprofloxacin (CIPRO) tablet 500 mg     500 mg Oral 2 times daily 10/04/12 1121     10/01/12 0815  metroNIDAZOLE (FLAGYL) IVPB 500 mg  Status:  Discontinued     500 mg 100 mL/hr over 60 Minutes Intravenous Every 8 hours 10/01/12 0810 10/03/12 0816   09/30/12 1200  ciprofloxacin (CIPRO) IVPB 400 mg  Status:  Discontinued     400 mg 200 mL/hr over 60 Minutes Intravenous Every 12 hours 09/30/12 1057 10/04/12 1121      Assessment/Plan: Cholecystitis s/p perc chole  She is doing well for gb.  This has been complicated by a stroke now.  We will continue following for gb and she should remain on abx for now.  Dr Benjamine Mola had kindly agreed to take over the medical care for this patient.  [facility] 10/06/2012

## 2012-10-06 NOTE — Evaluation (Addendum)
Physical Therapy Evaluation Patient Details Name: [name] MRN: [id number] DOB: 01-03-1946 Today's Date: 10/06/2012 Time: 4098-1191 PT Time Calculation (min): 33 min  PT Assessment / Plan / Recommendation History of Present Illness  pt admitted with epigatric pain, N/V.  Pt found to have cholecystitis,  Perc. drain placed.  Complications from polycythemia and Hgb level may have contributed to a new watershed CVA with R side weakness  Upper > lower and slurred speech.  Clinical Impression  Pt appears to have made good progress since her stroke happened.  Still doesn't attend to R UE well or try to use it functionally with mobility, but though weak, the arm is capable to assist if cued.  Will need HHPT to improve function with potential need of OPPT if UE function is slow to return.    PT Assessment  Patient needs continued PT services    Follow Up Recommendations  Home health PT;Supervision for mobility/OOB    Does the patient have the potential to tolerate intense rehabilitation      Barriers to Discharge        Equipment Recommendations  None recommended by PT    Recommendations for Other Services     Frequency Min 3X/week    Precautions / Restrictions Precautions Precautions: Fall Restrictions Weight Bearing Restrictions: No   Pertinent Vitals/Pain       Mobility  Bed Mobility Bed Mobility: Scooting to HOB;Sit to Supine Sitting - Scoot to Edge of Bed: 5: Supervision Sit to Supine: 4: Min guard;HOB flat Scooting to HOB: 4: Min assist;Other (comment) ([place] [facility] the [facility]) Details for Bed Mobility Assistance: moves generally well, does not use R UE to assist spontaneously Transfers Transfers: Sit to Stand;Stand to Sit Sit to Stand: 4: Min guard;From elevated surface;From chair/3-in-1 Stand to Sit: 4: Min guard;With upper extremity assist;To bed Details for Transfer Assistance: v/tc's for use of R UE on armres or to reach back to  bed. Ambulation/Gait Ambulation/Gait Assistance: 4: Min guard Ambulation Distance (Feet): 350 Feet Assistive device: Other (Comment) (IV pole) Ambulation/Gait Assistance Details: generally steady with heel/toe pattern equally both LE's Gait Pattern: Step-through pattern Stairs: No Modified Rankin (Stroke Patients Only) Pre-Morbid Rankin Score: No symptoms Modified Rankin: Moderate disability    Exercises     PT Diagnosis: Other (comment) (Hemiparesis mostly affecting R UE, mildly R LE)  PT Problem List: Decreased strength;Decreased activity tolerance;Decreased mobility;Decreased knowledge of use of DME PT Treatment Interventions: DME instruction;Gait training;Stair training;Therapeutic activities;Functional mobility training;Neuromuscular re-education;Patient/family education     PT Goals(Current goals can be found in the care plan section) Acute Rehab PT Goals Patient Stated Goal: Home with husband PT Goal Formulation: With patient Time For Goal Achievement: 10/13/12 Potential to Achieve Goals: Good  Visit Information  Last PT Received On: 10/06/12 Assistance Needed: +1 History of Present Illness: pt admitted with epigatric pain, N/V.  Pt found to have cholecystitis,  Perc. drain placed.  Complications from polycythemia and Hgb level may have contributed to a new watershed CVA with R side weakness  Upper > lower and slurred speech.       Prior Functioning  Home Living Family/patient expects to be discharged to:: Private residence Living Arrangements: Spouse/significant other Available Help at Discharge: Family Type of Home: House Home Access: Stairs to enter Secretary/administrator of Steps: 2 Entrance Stairs-Rails: Right;Left Home Layout: One level Home Equipment: Environmental consultant - 4 wheels;Bedside commode;Shower seat;Wheelchair - manual Prior Function Level of Independence: Independent with assistive device(s) Communication Communication: Expressive difficulties;Other  (comment) (  halted speech)    Cognition  Cognition Arousal/Alertness: Awake/alert Behavior During Therapy: WFL for tasks assessed/performed Overall Cognitive Status: Within Functional Limits for tasks assessed    Extremity/Trunk Assessment Upper Extremity Assessment Upper Extremity Assessment: RUE deficits/detail RUE Deficits / Details: weak grip at 3/5, grossly weak bicep, tricep and shoulder at 3+/5; uses the R UE only when cued to do so. (sensation Upper and LE WFL per pt and testing.) RUE Coordination: decreased fine motor Lower Extremity Assessment Lower Extremity Assessment: RLE deficits/detail RLE Deficits / Details: generally 4/5 and mildly uncoordinated vs L LE, L UE shows more deficits than R LE Cervical / Trunk Assessment Cervical / Trunk Assessment: Kyphotic   Balance Balance Balance Assessed: Yes Static Sitting Balance Static Sitting - Balance Support: Left upper extremity supported;Feet supported Static Sitting - Level of Assistance: 5: Stand by assistance Static Standing Balance Static Standing - Balance Support: Left upper extremity supported Static Standing - Level of Assistance: 5: Stand by assistance  End of Session PT - End of Session Activity Tolerance: Patient tolerated treatment well Patient left: in bed;with call bell/phone within reach Nurse Communication: Mobility status  GP     [name], [name] 10/06/2012, 11:59 AM

## 2012-10-06 NOTE — Evaluation (Signed)
Speech Language Pathology Evaluation Patient Details Name: [name] MRN: [id number] DOB: [birth year] Today's Date: 10/06/2012 Time: 4098-1191 SLP Time Calculation (min): 22 min  Problem List:  Patient Active Problem List   Diagnosis Date Noted  . CVA (cerebral infarction) 10/05/2012  . Septic shock 10/02/2012  . CAD (coronary artery disease) 09/30/2012  . Cholelithiases 09/30/2012  . Unspecified constipation 09/30/2012  . Bronchitis 03/08/2011  . Benign hypertensive heart disease without heart failure 09/28/2010  . Aortic valve sclerosis 09/28/2010  . Hypothyroidism 09/28/2010  . Polycythemia vera(238.4) 09/28/2010  . Postmenopausal state 09/28/2010  . Fibromyalgia 09/28/2010   Past Medical History:  Past Medical History  Diagnosis Date  . 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 Fibromyalgia   . Hypothyroidism   . Essential hypertension   . Postmenopausal state     on hormone replacement therapy  . Aortic valve sclerosis     by echocardiogram 12/04/2009  . PAC (premature atrial contraction)     history of  . Coronary artery disease   . Hyperlipidemia   . History of echocardiogram 12/04/2009    showed mild ventricle hypertrophy and normal EF at 55-60% -- mild aortic valve sclerosis  -- trace mitral regurgitation -- slightly pulmonary artery pressure at 47  --  [name], M.D.   . Fatigue    Past Surgical History: History reviewed. No pertinent past surgical history. HPI:  [name] is an 67 y.o. female with a history of polycythemia, hypertension and hyperlipidemia who was admitted on 09/30/2012 with acute cholecystitis. She status post drain placement. She experienced sudden onset of weakness involving her right hand as well as change in speech at around noon on 10/04/2012. 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 MRA showed markedly diminished flow and possible obstruction of left ICA.   Assessment / Plan /  Recommendation Clinical Impression  Pt demonstrates a moderate expressive aphasia, most noticable at conversation level. Pt observed to have semantic paraphasias, perseveration and anomia impacting word finding, particularly with conversatonal speech. SLP offered basic word finding strategies. She will benefit from continued SLP therapy to facilitate functional communication.     SLP Assessment  Patient needs continued Speech Lanaguage Pathology Services    Follow Up Recommendations       Frequency and Duration min 2x/week  2 weeks   Pertinent Vitals/Pain NA   SLP Goals  SLP Goals Potential to Achieve Goals: Good SLP Goal #1: Pt will make requests at phrase level during functional task with min verbal cues for word finding.  SLP Goal #1 - Progress: Progressing toward goal SLP Goal #2: Pt will use compensatory strategies for word finding during conversation with moderate verbal cues over 10 minutes.   SLP Evaluation Prior Functioning  Cognitive/Linguistic Baseline: Within functional limits Type of Home: House  Lives With: Spouse Available Help at Discharge: Family   Cognition  Overall Cognitive Status: Within Functional Limits for tasks assessed Arousal/Alertness: Awake/alert Orientation Level: Oriented X4 Attention: Focused;Sustained Focused Attention: Appears intact Sustained Attention: Impaired Sustained Attention Impairment: Verbal complex Awareness:  (right neglect) Problem Solving: Impaired Safety/Judgment: Appears intact    Comprehension  Auditory Comprehension Overall Auditory Comprehension: Appears within functional limits for tasks assessed    Expression Verbal Expression Overall Verbal Expression: Impaired Initiation: No impairment Automatic Speech: Name;Day of week;Counting;Social Response Level of Generative/Spontaneous Verbalization: Phrase;Word Repetition: No impairment Naming: Impairment Responsive: 76-100% accurate Confrontation:  Impaired Convergent: 75-100% accurate Divergent: 75-100% accurate Verbal Errors: Semantic paraphasias;Perseveration;Aware of  errors Pragmatics: No impairment   Oral / Motor Oral Motor/Sensory Function Overall Oral Motor/Sensory Function: Appears within functional limits for tasks assessed Motor Speech Overall Motor Speech: Appears within functional limits for tasks assessed   GO    Harlon Ditty, MA CCC-SLP [phone]   Claudine Mouton 10/06/2012, 3:23 PM

## 2012-10-06 NOTE — Progress Notes (Signed)
[facility] PROGRESS NOTE  [name] ZOX:[id number] DOB: 11-15-1945 DOA: 09/30/2012 PCP: [name], MD  Assessment/Plan: Weakness- +MRI for CVA - neurology consulted -appreciate their assistance (FLp, HgbA1C) - Pt/OT/SLP -carotids - difficult situation with a complicated patient  Cholelithiasis-acute cholecystitis, patient had a percutaneous drain placed on 7/7, WBC has trended down, status post HIDA scan with cystic duct obstruction, surgery following,  Drain to stay in for 6-8 weeks then re-evaluation for cholecystecomy  abx per surgery  Chronic pain -trying to schedule as patient takes at home- family very difficult  Shortness of breath  Wean off O2 -resume lasix -echo ok   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 Should not get above 7 per hematology (Ennever, transfused one unit by Dr. Twanna Hy on 7/8, hemoglobin up to 6.2 ) Suspect with lab draws will slowly decrease to desired levels -Ennover ordered 500cc plebotomy  Hypertension, HCTZ/lisinopril/Lasix resumed-discontinue HCTZ and decrease lisinopril dosing  CAD Pt's Cardiologist note fm 09/28/2010 She does not have a history of known ischemic heart disease. She had an echocardiogram in 12/04/09 which showed normal left ventricular systolic function with an ejection fraction of 55-60% and normal diastolic function and mild aortic sclerosis.    Hypothyroidism; per patient and husband unable to take Synthroid. On Armour Thyroid 120 mg daily (patient has home meds) TSH suppressed; free t4 ok and will need to recheck as an outaptient  Constipation- added bowel regimen with enema   - will assume attending role from surgery as there are multiple medical problems  Code Status: full Family Communication: patient/husband- answered all the questions Disposition Plan: rehab      HPI/Subjective: +MRI last night Patient has no complaints Answered all the husbands questions   Objective: Filed  Vitals:   10/05/12 0631 10/05/12 1300 10/05/12 2030 10/06/12 0622  BP: 136/90 110/50 134/52 130/62  Pulse: 91 57 115 84  Temp: 98.9 F (37.2 C) 98.6 F (37 C)  98.1 F (36.7 C)  TempSrc: Oral Oral  Oral  Resp: 18 19  17   SpO2: 92% 91% 100% 100%    Intake/Output Summary (Last 24 hours) at 10/06/12 1059 Last data filed at 10/06/12 0946  Gross per 24 hour  Intake    150 ml  Output    705 ml  Net   -555 ml   There were no vitals filed for this visit.  Exam:   General:  Slow to respond  Cardiovascular: rrr  Respiratory: no wheezing  Abdomen: +BS, soft  Musculoskeletal: right sided weakness   Data Reviewed: Basic Metabolic Panel:  Recent Labs Lab 09/30/12 0115  10/01/12 0500 10/02/12 0505 10/03/12 0400 10/04/12 0610 10/04/12 1309 10/05/12 1000  NA 137  --  134* 134* 135 135 135 134*  K 3.4*  --  3.3* 4.6 3.9 4.0 3.8 3.9  CL 94*  --  98 99 102 104 103 97  CO2  --   --  30 24 24 24 23 27   GLUCOSE 125*  --  123* 110* 127* 91 90 134*  BUN 9  --  8 13 15 10 8 8   CREATININE 1.00  < > 0.76 1.02 0.80 0.65 0.57 0.60  CALCIUM  --   --  8.6 8.7 8.6 8.2* 8.2* 8.6  MG  --   --  1.8 2.1 2.3  --   --   --   < > = values in this interval not displayed. Liver Function Tests:  Recent Labs Lab 09/30/12 0039 10/01/12 0500  10/02/12 0505 10/03/12 0400 10/04/12 1309  AST 15 11 19 13 13   ALT 6 5 8 9 9   ALKPHOS 52 49 78 61 46  BILITOT 0.3 0.6 0.6 0.4 0.4  PROT 6.6 6.0 6.3 6.0 5.6*  ALBUMIN 3.5 3.0* 3.0* 2.7* 2.5*    Recent Labs Lab 09/30/12 0039  LIPASE 23   No results found for this basename: AMMONIA,  in the last 168 hours CBC:  Recent Labs Lab 09/30/12 0535  10/01/12 0500 10/02/12 0505 10/03/12 0400 10/04/12 0610 10/05/12 1000  WBC 7.4  < > 21.7* 37.6* 19.9* 10.1 11.7*  NEUTROABS 6.6  --  19.6* 33.8* 17.7*  --  9.7*  HGB 5.7*  < > 5.5* 5.5* 5.2* 6.2* 7.1*  HCT 23.1*  < > 22.4* 22.6* 21.8* 24.0* 26.7*  MCV 61.6*  < > 61.4* 62.8* 62.6* 64.9* 64.5*  PLT  202  < > 216 PLATELET CLUMPS NOTED ON SMEAR, COUNT APPEARS ADEQUATE 222 181 231  < > = values in this interval not displayed. Cardiac Enzymes:  Recent Labs Lab 09/30/12 1911 09/30/12 2334 10/01/12 0500 10/04/12 1323  TROPONINI <0.30 <0.30 <0.30 <0.30   BNP (last 3 results)  Recent Labs  10/05/12 0455  PROBNP 2326.0*   CBG:  Recent Labs Lab 10/03/12 0811 10/03/12 1129 10/03/12 1554 10/04/12 0009 10/04/12 0804  GLUCAP 89 141* 93 101* 86    Recent Results (from the past 240 hour(s))  URINE CULTURE     Status: None   Collection Time    10/01/12  9:04 PM      Result Value Range Status   Specimen Description URINE, CLEAN CATCH   Final   Special Requests NONE   Final   Culture  Setup Time 10/02/2012 02:48   Final   Colony Count NO GROWTH   Final   Culture NO GROWTH   Final   Report Status 10/03/2012 FINAL   Final  SURGICAL PCR SCREEN     Status: None   Collection Time    10/01/12 11:28 PM      Result Value Range Status   MRSA, PCR NEGATIVE  NEGATIVE Final   Staphylococcus aureus NEGATIVE  NEGATIVE Final   Comment:            The Xpert SA Assay (FDA     approved for NASAL specimens     in patients over 60 years of age),     is one component of     a comprehensive surveillance     program.  Test performance has     been validated by [employer] for patients greater     than or equal to 71 year old.     It is not intended     to diagnose infection nor to     guide or monitor treatment.  CULTURE, ROUTINE-ABSCESS     Status: None   Collection Time    10/02/12  1:55 PM      Result Value Range Status   Specimen Description ABSCESS GALL BLADDER   Final   Special Requests NONE   Final   Gram Stain     Final   Value: NO WBC SEEN     NO SQUAMOUS EPITHELIAL CELLS SEEN     MODERATE GRAM POSITIVE RODS     MODERATE GRAM POSITIVE COCCI     IN PAIRS FEW GRAM NEGATIVE RODS   Culture     Final   Value: MULTIPLE ORGANISMS  PRESENT, NONE PREDOMINANT     Note: NO  STAPHYLOCOCCUS AUREUS ISOLATED NO GROUP A STREP (S.PYOGENES) ISOLATED   Report Status 10/05/2012 FINAL   Final  ANAEROBIC CULTURE     Status: None   Collection Time    10/02/12  1:55 PM      Result Value Range Status   Specimen Description ABSCESS GALL BLADDER   Final   Special Requests NONE   Final   Gram Stain     Final   Value: NO WBC SEEN     NO SQUAMOUS EPITHELIAL CELLS SEEN     MODERATE GRAM POSITIVE RODS     MODERATE GRAM POSITIVE COCCI     IN PAIRS FEW GRAM NEGATIVE RODS   Culture     Final   Value: NO ANAEROBES ISOLATED; CULTURE IN PROGRESS FOR 5 DAYS   Report Status PENDING   Incomplete  MRSA PCR SCREENING     Status: None   Collection Time    10/02/12  2:09 PM      Result Value Range Status   MRSA by PCR NEGATIVE  NEGATIVE Final   Comment:            The GeneXpert MRSA Assay (FDA     approved for NASAL specimens     only), is one component of a     comprehensive MRSA colonization     surveillance program. It is not     intended to diagnose MRSA     infection nor to guide or     monitor treatment for     MRSA infections.  CULTURE, BLOOD (ROUTINE X 2)     Status: None   Collection Time    10/02/12  2:30 PM      Result Value Range Status   Specimen Description BLOOD RIGHT ARM   Final   Special Requests BOTTLES DRAWN AEROBIC AND ANAEROBIC 10CC   Final   Culture  Setup Time 10/02/2012 21:33   Final   Culture     Final   Value:        BLOOD CULTURE RECEIVED NO GROWTH TO DATE CULTURE WILL BE HELD FOR 5 DAYS BEFORE ISSUING A FINAL NEGATIVE REPORT   Report Status PENDING   Incomplete  CULTURE, BLOOD (ROUTINE X 2)     Status: None   Collection Time    10/02/12  2:40 PM      Result Value Range Status   Specimen Description BLOOD RIGHT ARM   Final   Special Requests BOTTLES DRAWN AEROBIC AND ANAEROBIC 10CC   Final   Culture  Setup Time 10/02/2012 21:33   Final   Culture     Final   Value:        BLOOD CULTURE RECEIVED NO GROWTH TO DATE CULTURE WILL BE HELD FOR 5 DAYS  BEFORE ISSUING A FINAL NEGATIVE REPORT   Report Status PENDING   Incomplete     Studies: Mr Shirlee Latch UX Contrast  10/05/2012   *RADIOLOGY REPORT*  Clinical Data:  Right arm weakness and abnormal speech.  MRI HEAD WITHOUT CONTRAST MRA HEAD WITHOUT CONTRAST  Technique:  Multiplanar, multiecho pulse sequences of the brain and surrounding structures were obtained without intravenous contrast. Angiographic images of the head were obtained using MRA technique without contrast.  Comparison:  MRI brain at [facility] 12/15/2011.  MRI HEAD  Findings:  Scattered areas of restricted diffusion are evident within a watershed distribution between the left ACA and MCA territories.  T2  hyperintensities are associated with the areas of acute infarction.  Mild generalized atrophy is otherwise stable.  No hemorrhage or mass lesion is evident.  The ventricles are of normal size.  A remote lacunar infarct of the right caudate head is stable.  Marrow signal in the upper cervical spine and calvarium is decreased relative to the prior study.  No discrete lesions are evident. Abnormal signal is present within the left internal carotid arteries suggesting occlusion or high-grade stenosis.  The flow is present in the right internal carotid artery and the posterior circulation.  The globes and orbits are intact.  Minimal mucosal thickening is present throughout the paranasal sinuses.  No fluid levels are present.  The mastoid air cells are clear.  IMPRESSION:  1. Scattered acute / subacute non hemorrhagic infarcts within a watershed territory between the left ACA and MCA distributions. 2.  Occluded or extremely slow flow within the left internal carotid artery at the skull base. 3.  Stable atrophy and minimal white matter disease otherwise. 4.  Decreased T1 marrow signal within the upper cervical spine and calvarium.  This is compatible with the patient's known anemia.  MRA HEAD  Findings: Markedly diminished signal is evident  within the left internal carotid artery, particularly within the cavernous segment. There is diminished signal in the left A1 segment compared the right.  The left M1 segment is less robust than on the right with more pronounced segmental attenuation of distal left MCA branch vessels.  The right vertebral artery is slightly dominant to the left.  The PICA origins are visualized and normal.  The basilar artery is within normal limits.  Both posterior cerebral arteries originate from the basilar tip.  MRA signal is evident into much more distal left PCA branch vessels.  There is attenuation of right-sided PCA branch vessels.  IMPRESSION:  1.  Markedly diminished signal within the left internal carotid artery and branch vessels.  This corresponds to the area of infarction and raises concern for mil proximal stenosis. 2.  There may be a high-grade stenosis within the cavernous segment. 3.  Prominent left PCA branch vessels.  This is likely related to attempts at collateral flow.  These results were called by telephone on 10/05/2012 at 08:05 p.m. to Tana Conch, R.N., [facility] 6NT, who verbally acknowledged these results.   Original Report Authenticated By: Marin Roberts, M.D.   Mr Brain Wo Contrast  10/05/2012   *RADIOLOGY REPORT*  Clinical Data:  Right arm weakness and abnormal speech.  MRI HEAD WITHOUT CONTRAST MRA HEAD WITHOUT CONTRAST  Technique:  Multiplanar, multiecho pulse sequences of the brain and surrounding structures were obtained without intravenous contrast. Angiographic images of the head were obtained using MRA technique without contrast.  Comparison:  MRI brain at [facility] 12/15/2011.  MRI HEAD  Findings:  Scattered areas of restricted diffusion are evident within a watershed distribution between the left ACA and MCA territories.  T2 hyperintensities are associated with the areas of acute infarction.  Mild generalized atrophy is otherwise stable.  No hemorrhage or mass lesion is  evident.  The ventricles are of normal size.  A remote lacunar infarct of the right caudate head is stable.  Marrow signal in the upper cervical spine and calvarium is decreased relative to the prior study.  No discrete lesions are evident. Abnormal signal is present within the left internal carotid arteries suggesting occlusion or high-grade stenosis.  The flow is present in the right internal carotid artery and the posterior circulation.  The globes and orbits are intact.  Minimal mucosal thickening is present throughout the paranasal sinuses.  No fluid levels are present.  The mastoid air cells are clear.  IMPRESSION:  1. Scattered acute / subacute non hemorrhagic infarcts within a watershed territory between the left ACA and MCA distributions. 2.  Occluded or extremely slow flow within the left internal carotid artery at the skull base. 3.  Stable atrophy and minimal white matter disease otherwise. 4.  Decreased T1 marrow signal within the upper cervical spine and calvarium.  This is compatible with the patient's known anemia.  MRA HEAD  Findings: Markedly diminished signal is evident within the left internal carotid artery, particularly within the cavernous segment. There is diminished signal in the left A1 segment compared the right.  The left M1 segment is less robust than on the right with more pronounced segmental attenuation of distal left MCA branch vessels.  The right vertebral artery is slightly dominant to the left.  The PICA origins are visualized and normal.  The basilar artery is within normal limits.  Both posterior cerebral arteries originate from the basilar tip.  MRA signal is evident into much more distal left PCA branch vessels.  There is attenuation of right-sided PCA branch vessels.  IMPRESSION:  1.  Markedly diminished signal within the left internal carotid artery and branch vessels.  This corresponds to the area of infarction and raises concern for mil proximal stenosis. 2.  There may be  a high-grade stenosis within the cavernous segment. 3.  Prominent left PCA branch vessels.  This is likely related to attempts at collateral flow.  These results were called by telephone on 10/05/2012 at 08:05 p.m. to Tana Conch, R.N., [facility] 6NT, who verbally acknowledged these results.   Original Report Authenticated By: Marin Roberts, M.D.   Dg Chest Port 1 View  10/04/2012   *RADIOLOGY REPORT*  Clinical Data: Increased shortness of breath, evaluate for possible fluid overload  PORTABLE CHEST - 1 VIEW  Comparison: Portable chest x-ray of 10/02/2012  Findings: The lungs appear slightly better aerated.  Mild cardiomegaly is stable.  No definite pulmonary vascular congestion is currently seen.  There are degenerative changes throughout the thoracic spine.  IMPRESSION: Slightly better aeration.  Stable cardiomegaly.  No definite pulmonary vascular congestion.   Original Report Authenticated By: Dwyane Dee, M.D.    Scheduled Meds: . antiseptic oral rinse  15 mL Mouth Rinse q12n4p  . aspirin  325 mg Oral Daily  . bisoprolol  10 mg Oral Daily  . ciprofloxacin  500 mg Oral BID  . docusate sodium  100 mg Oral Daily  . enoxaparin (LOVENOX) injection  40 mg Subcutaneous Q24H  . estrogens (conjugated)  0.625 mg Oral BID  . furosemide  40 mg Oral Daily  . ketorolac  30 mg Intravenous Once  . lisinopril  10 mg Oral Daily  . mineral oil   Oral Q2000  . OxyCODONE  20 mg Oral Custom  . pantoprazole  40 mg Oral Q1200  . polyethylene glycol  17 g Oral TID  . potassium chloride  40 mEq Oral BID  . senna  1 tablet Oral TID  . thyroid  150 mg Oral QAC breakfast   Continuous Infusions: . sodium chloride 75 mL/hr (10/06/12 0902)    Principal Problem:   Benign hypertensive heart disease without heart failure Active Problems:   Hypothyroidism   Polycythemia vera(238.4)   Fibromyalgia   CAD (coronary artery disease)   Cholelithiases   Unspecified  constipation   Septic shock   CVA (cerebral  infarction)    Time spent: 35    [facility], Kurtis Anastasia  [facility] Pager [phone]. If 7PM-7AM, please contact night-coverage at [email or web address], password [facility] 10/06/2012, 10:59 AM  LOS: 6 days

## 2012-10-06 NOTE — Progress Notes (Addendum)
Late Entry: Dr. Alfredo Batty called  2006, shortly after patient returned from radiology,  to state that patient indeed had an acute infarct. Dr Janee Morn was notified with [facility] and Hospitalist was notified with [facility]. Charge nurse aware and clarified with [facility] if Code Stroke needed to be initiated. Will continue to monitor and assess patient.

## 2012-10-07 DIAGNOSIS — K810 Acute cholecystitis: Secondary | ICD-10-CM

## 2012-10-07 DIAGNOSIS — I63239 Cerebral infarction due to unspecified occlusion or stenosis of unspecified carotid arteries: Secondary | ICD-10-CM

## 2012-10-07 HISTORY — DX: Acute cholecystitis: K81.0

## 2012-10-07 LAB — LIPID PANEL
LDL Cholesterol: 23 mg/dL (ref 0–99)
Triglycerides: 121 mg/dL (ref <150)
VLDL: 24 mg/dL (ref 0–40)

## 2012-10-07 LAB — CBC
MCH: 17.1 pg — ABNORMAL LOW (ref 26.0–34.0)
MCHC: 26 g/dL — ABNORMAL LOW (ref 30.0–36.0)
MCV: 66 fL — ABNORMAL LOW (ref 78.0–100.0)
Platelets: 196 10*3/uL (ref 150–400)
RDW: 26.2 % — ABNORMAL HIGH (ref 11.5–15.5)

## 2012-10-07 LAB — ANAEROBIC CULTURE

## 2012-10-07 MED ORDER — LISINOPRIL 5 MG PO TABS
5.0000 mg | ORAL_TABLET | Freq: Every day | ORAL | Status: DC
  Administered 2012-10-07 – 2012-10-08 (×2): 5 mg via ORAL
  Filled 2012-10-07 (×3): qty 1

## 2012-10-07 NOTE — Evaluation (Signed)
Occupational Therapy Evaluation Patient Details Name: [name] MRN: [id number] DOB: Oct 09, 1945 Today's Date: 10/07/2012 Time: 4098-1191 OT Time Calculation (min): 26 min  OT Assessment / Plan / Recommendation History of present illness Pt with a history of polycythemia, hypertension and hyperlipidemia who was admitted on 09/30/2012 with acute cholecystitis. She status post drain placement. She experienced sudden onset of weakness involving her right hand as well as change in speech at around noon on 10/04/2012. 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 MRA showed markedly diminished flow and possible obstruction of left ICA. Marland Kitchen    Clinical Impression   Pt demonstrating RUE weakness and decreased sensation as well as decreased attention to RUE. Also with poor activity tolerance during ADLs/functional mobility. Will continue to follow acutely in order to address below problem list. Recommending 24/7 assist at home with [facility] for d/c planning.    OT Assessment  Patient needs continued OT Services    Follow Up Recommendations  Home health OT;Supervision/Assistance - 24 hour    Barriers to Discharge      Equipment Recommendations  None recommended by OT    Recommendations for Other Services    Frequency  Min 3X/week    Precautions / Restrictions Precautions Precautions: Fall   Pertinent Vitals/Pain See vitals    ADL  Upper Body Bathing: Simulated;Supervision/safety Where Assessed - Upper Body Bathing: Unsupported sitting Lower Body Bathing: Simulated;Moderate assistance Where Assessed - Lower Body Bathing: Unsupported sit to stand Upper Body Dressing: Performed;Minimal assistance Where Assessed - Upper Body Dressing: Unsupported sitting Lower Body Dressing: Performed;Moderate assistance Where Assessed - Lower Body Dressing: Supported sit to stand Toilet Transfer: Mining engineer Method: Sit to Barista:   (bed) Equipment Used: Gait belt Transfers/Ambulation Related to ADLs: Min assist for ambulation.  Assist for steadying and balance due to tendency to veer to right side. ADL Comments: Pt generally weak. 2/4 dyspnea during functional mobility.      OT Diagnosis: Generalized weakness;Paresis  OT Problem List: Decreased strength;Impaired balance (sitting and/or standing);Decreased activity tolerance;Decreased safety awareness;Decreased knowledge of use of DME or AE;Impaired UE functional use;Impaired sensation OT Treatment Interventions: Self-care/ADL training;DME and/or AE instruction;Therapeutic activities;Patient/family education;Balance training;Energy conservation   OT Goals(Current goals can be found in the care plan section) Acute Rehab OT Goals Patient Stated Goal: Home with husband OT Goal Formulation: With patient Time For Goal Achievement: 10/21/12 Potential to Achieve Goals: Good  Visit Information  Last OT Received On: 10/07/12 Assistance Needed: +1 History of Present Illness: pt admitted with epigatric pain, N/V.  Pt found to have cholecystitis,  Perc. drain placed.  Complications from polycythemia and Hgb level may have contributed to a new watershed CVA with R side weakness  Upper > lower and slurred speech.       Prior Functioning     Home Living Family/patient expects to be discharged to:: Private residence Living Arrangements: Spouse/significant other Available Help at Discharge: Family;Available 24 hours/day Type of Home: House Home Access: Stairs to enter [employer] of Steps: 2 Entrance Stairs-Rails: Right;Left Home Layout: One level Home Equipment: Walker - 4 wheels;Bedside commode;Shower seat;Wheelchair - manual  Lives With: Spouse Prior Function Level of Independence: Independent with assistive device(s) Communication Communication: Expressive difficulties Dominant Hand: Right         Vision/Perception     Cognition   Cognition Arousal/Alertness: Awake/alert Behavior During Therapy: WFL for tasks assessed/performed Overall Cognitive Status: Impaired/Different from baseline Area of Impairment: Safety/judgement Safety/Judgement: Decreased awareness of deficits  General Comments: Pt unaware of RUE position or placement at times.     Extremity/Trunk Assessment Upper Extremity Assessment Upper Extremity Assessment: RUE deficits/detail RUE Deficits / Details: Grossly 3+/5 RUE Sensation: decreased light touch;decreased proprioception RUE Coordination: decreased fine motor;decreased gross motor     Mobility Bed Mobility Bed Mobility: Supine to Sit;Sitting - Scoot to Edge of Bed;Sit to Supine Supine to Sit: 3: Mod assist Sitting - Scoot to Edge of Bed: 5: Supervision Sit to Supine: 4: Min guard;HOB flat Details for Bed Mobility Assistance: Assist for elevating trunk OOB. Transfers Transfers: Sit to Stand;Stand to Sit Sit to Stand: 4: Min guard;From bed;With upper extremity assist Stand to Sit: 4: Min guard;To bed;With upper extremity assist     Exercise     Balance Balance Balance Assessed: Yes Static Sitting Balance Static Sitting - Balance Support: No upper extremity supported;Feet supported Static Sitting - Level of Assistance: 5: Stand by assistance Static Standing Balance Static Standing - Balance Support: No upper extremity supported Static Standing - Level of Assistance: 5: Stand by assistance;4: Min assist Static Standing - Comment/# of Minutes: Close guarding for safety while standing 1-2 minutes.   End of Session OT - End of Session Equipment Utilized During Treatment: Gait belt Activity Tolerance: Patient tolerated treatment well Patient left: in bed;with call bell/phone within reach;with family/visitor present Nurse Communication: Mobility status  GO   10/07/2012 Cipriano Mile OTR/L Pager [phone] Office [phone]   Cipriano Mile 10/07/2012, 5:04 PM

## 2012-10-07 NOTE — Progress Notes (Signed)
Benign hypertensive heart disease without heart failure  Assessment: Stable from GB standpoint  Plan: Leave drain for several weeks. Will need decisions about carotids before deciding about any surgery for GB   Subjective: Feels better from abd standpoint, not much pain  Objective: Vital signs in last 24 hours: Temp:  [97.7 F (36.5 C)-98.3 F (36.8 C)] 98.3 F (36.8 C) (07/12 0509) Pulse Rate:  [71-75] 75 (07/12 0509) Resp:  [16-18] 18 (07/12 0509) BP: (115-133)/(60-62) 115/60 mmHg (07/12 0509) SpO2:  [97 %-100 %] 100 % (07/12 0509) Weight:  [164 lb (74.39 kg)-164 lb 0.7 oz (74.41 kg)] 164 lb 0.7 oz (74.41 kg) (07/12 0509) Last BM Date: 10/06/12  Intake/Output from previous day: 07/11 0701 - 07/12 0700 In: 985 [P.O.:180; I.V.:800] Out: 3128 [Urine:2980; Drains:145; Stool:3]  General appearance: alert, cooperative and pale GI: soft, non-tender; bowel sounds normal; no masses,  no organomegaly Drain producing bile, clear, not purulent  Lab Results:  Results for orders placed during the hospital encounter of 09/30/12 (from the past 24 hour(s))  CBC     Status: Abnormal   Collection Time    10/06/12 11:55 AM      Result Value Range   WBC 8.6  4.0 - 10.5 K/uL   RBC 3.72 (*) 3.87 - 5.11 MIL/uL   Hemoglobin 6.4 (*) 12.0 - 15.0 g/dL   HCT 16.1 (*) 09.6 - 04.5 %   MCV 65.6 (*) 78.0 - 100.0 fL   MCH 17.2 (*) 26.0 - 34.0 pg   MCHC 26.2 (*) 30.0 - 36.0 g/dL   RDW 40.9 (*) 81.1 - 91.4 %   Platelets 221  150 - 400 K/uL  BASIC METABOLIC PANEL     Status: Abnormal   Collection Time    10/06/12 11:55 AM      Result Value Range   Sodium 136  135 - 145 mEq/L   Potassium 4.4  3.5 - 5.1 mEq/L   Chloride 101  96 - 112 mEq/L   CO2 31  19 - 32 mEq/L   Glucose, Bld 108 (*) 70 - 99 mg/dL   BUN 7  6 - 23 mg/dL   Creatinine, Ser 7.82  0.50 - 1.10 mg/dL   Calcium 8.3 (*) 8.4 - 10.5 mg/dL   GFR calc non Af Amer >90  >90 mL/min   GFR calc Af Amer >90  >90 mL/min  VITAMIN B12      Status: Abnormal   Collection Time    10/06/12 11:55 AM      Result Value Range   Vitamin B-12 1045 (*) 211 - 911 pg/mL  CBC     Status: Abnormal   Collection Time    10/07/12  7:00 AM      Result Value Range   WBC 8.1  4.0 - 10.5 K/uL   RBC 3.56 (*) 3.87 - 5.11 MIL/uL   Hemoglobin 6.1 (*) 12.0 - 15.0 g/dL   HCT 95.6 (*) 21.3 - 08.6 %   MCV 66.0 (*) 78.0 - 100.0 fL   MCH 17.1 (*) 26.0 - 34.0 pg   MCHC 26.0 (*) 30.0 - 36.0 g/dL   RDW 57.8 (*) 46.9 - 62.9 %   Platelets 196  150 - 400 K/uL  LIPID PANEL     Status: Abnormal   Collection Time    10/07/12  7:00 AM      Result Value Range   Cholesterol 58  0 - 200 mg/dL   Triglycerides 528  <413 mg/dL  HDL 11 (*) >39 mg/dL   Total CHOL/HDL Ratio 5.3     VLDL 24  0 - 40 mg/dL   LDL Cholesterol 23  0 - 99 mg/dL     Studies/Results Radiology     MEDS, Scheduled . antiseptic oral rinse  15 mL Mouth Rinse q12n4p  . aspirin  325 mg Oral Daily  . bisoprolol  10 mg Oral Daily  . ciprofloxacin  500 mg Oral BID  . clopidogrel  75 mg Oral Daily  . docusate sodium  100 mg Oral Daily  . enoxaparin (LOVENOX) injection  40 mg Subcutaneous Q24H  . estrogens (conjugated)  0.625 mg Oral BID  . furosemide  40 mg Oral Daily  . lisinopril  5 mg Oral Daily  . mineral oil   Oral Q2000  . OxyCODONE  20 mg Oral Custom  . pantoprazole  40 mg Oral Q1200  . polyethylene glycol  17 g Oral TID  . potassium chloride  40 mEq Oral BID  . senna  1 tablet Oral TID  . thyroid  150 mg Oral QAC breakfast       LOS: 7 days    Currie Paris, MD, [facility], [place] [phone]   10/07/2012 9:43 AM

## 2012-10-07 NOTE — Progress Notes (Addendum)
[facility] PROGRESS NOTE  VONNE MCDANEL ZOX:[id number] DOB: Jul 20, 1945 DOA: 09/30/2012 PCP: [name], MD  Assessment/Plan: Weakness- +MRI for CVA - neurology consulted -appreciate their assistance (FLp- LDL 23, HDL 11, HgbA1C) - Pt/OT/SLP -carotids- will defer to neurology- ? 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 Should not get above 7 per hematology (Ennever, transfused one unit by Dr. Twanna Hy on 7/8, hemoglobin up to 6.2 ) Suspect with lab draws will slowly decrease to desired levels -Ennover ordered 500cc plebotomy- Hgb 6.1 do not think patient will need  Hypertension, discontinue HCTZ and decrease lisinopril dosing  CAD Pt's Cardiologist note fm 09/28/2010 She does not have a history of known ischemic heart disease. She had an echocardiogram in 12/04/09 which showed normal left ventricular systolic function with an ejection fraction of 55-60% and normal diastolic function and mild aortic sclerosis.    Hypothyroidism; per patient and husband unable to take Synthroid. On Armour Thyroid 120 mg daily (patient has home meds) TSH suppressed; free t4 ok and will need to recheck as an outaptient  Constipation- added bowel regimen with enema- did not need enema     Code Status: full Family Communication: patient/husband- answered all the questions Disposition Plan: home health PT/OT/SLP/RN      HPI/Subjective: +BM No SOB, no CP Sitting in  chair   Objective: Filed Vitals:   10/05/12 2030 10/06/12 0622 10/06/12 1330 10/07/12 0509  BP: 134/52 130/62 133/62 115/60  Pulse: 115 84 71 75  Temp:  98.1 F (36.7 C) 97.7 F (36.5 C) 98.3 F (36.8 C)  TempSrc:  Oral Oral Oral  Resp:  17 16 18   Height:   5\' 3"  (1.6 m) 5\' 3"  (1.6 m)  Weight:   74.39 kg (164 lb) 74.41 kg (164 lb 0.7 oz)  SpO2: 100% 100% 97% 100%    Intake/Output Summary (Last 24 hours) at 10/07/12 0938 Last data filed at 10/07/12 0629  Gross per 24 hour  Intake    985 ml  Output   3128 ml  Net  -2143 ml   Filed Weights   10/06/12 1330 10/07/12 0509  Weight: 74.39 kg (164 lb) 74.41 kg (164 lb 0.7 oz)    Exam:   General:  Slow to respond  Cardiovascular: rrr  Respiratory: no wheezing  Abdomen: +BS, soft  Musculoskeletal: right sided weakness   Data Reviewed: Basic Metabolic Panel:  Recent Labs Lab 10/01/12 0500 10/02/12 0505 10/03/12 0400 10/04/12 0610 10/04/12 1309 10/05/12 1000 10/06/12 1155  NA 134* 134* 135 135 135 134* 136  K 3.3* 4.6 3.9 4.0 3.8 3.9 4.4  CL 98 99 102 104 103 97 101  CO2 30 24 24 24 23 27 31   GLUCOSE 123* 110* 127* 91 90 134* 108*  BUN 8 13 15 10 8 8 7   CREATININE 0.76 1.02 0.80 0.65 0.57 0.60 0.59  CALCIUM 8.6 8.7 8.6 8.2* 8.2* 8.6 8.3*  MG 1.8 2.1 2.3  --   --   --   --  Liver Function Tests:  Recent Labs Lab 10/01/12 0500 10/02/12 0505 10/03/12 0400 10/04/12 1309  AST 11 19 13 13   ALT 5 8 9 9   ALKPHOS 49 78 61 46  BILITOT 0.6 0.6 0.4 0.4  PROT 6.0 6.3 6.0 5.6*  ALBUMIN 3.0* 3.0* 2.7* 2.5*   No results found for this basename: LIPASE, AMYLASE,  in the last 168 hours No results found for this basename: AMMONIA,  in the last 168 hours CBC:  Recent Labs Lab 10/01/12 0500 10/02/12 0505 10/03/12 0400 10/04/12 0610 10/05/12 1000 10/06/12 1155 10/07/12 0700  WBC 21.7* 37.6* 19.9* 10.1 11.7* 8.6 8.1  NEUTROABS 19.6* 33.8* 17.7*  --  9.7*  --   --   HGB 5.5* 5.5* 5.2* 6.2* 7.1* 6.4* 6.1*   HCT 22.4* 22.6* 21.8* 24.0* 26.7* 24.4* 23.5*  MCV 61.4* 62.8* 62.6* 64.9* 64.5* 65.6* 66.0*  PLT 216 PLATELET CLUMPS NOTED ON SMEAR, COUNT APPEARS ADEQUATE 222 181 231 221 196   Cardiac Enzymes:  Recent Labs Lab 09/30/12 1911 09/30/12 2334 10/01/12 0500 10/04/12 1323  TROPONINI <0.30 <0.30 <0.30 <0.30   BNP (last 3 results)  Recent Labs  10/05/12 0455  PROBNP 2326.0*   CBG:  Recent Labs Lab 10/03/12 0811 10/03/12 1129 10/03/12 1554 10/04/12 0009 10/04/12 0804  GLUCAP 89 141* 93 101* 86    Recent Results (from the past 240 hour(s))  URINE CULTURE     Status: None   Collection Time    10/01/12  9:04 PM      Result Value Range Status   Specimen Description URINE, CLEAN CATCH   Final   Special Requests NONE   Final   Culture  Setup Time 10/02/2012 02:48   Final   Colony Count NO GROWTH   Final   Culture NO GROWTH   Final   Report Status 10/03/2012 FINAL   Final  SURGICAL PCR SCREEN     Status: None   Collection Time    10/01/12 11:28 PM      Result Value Range Status   MRSA, PCR NEGATIVE  NEGATIVE Final   Staphylococcus aureus NEGATIVE  NEGATIVE Final   Comment:            The Xpert SA Assay (FDA     approved for NASAL specimens     in patients over 47 years of age),     is one component of     a comprehensive surveillance     program.  Test performance has     been validated by [employer] for patients greater     than or equal to 47 year old.     It is not intended     to diagnose infection nor to     guide or monitor treatment.  CULTURE, ROUTINE-ABSCESS     Status: None   Collection Time    10/02/12  1:55 PM      Result Value Range Status   Specimen Description ABSCESS GALL BLADDER   Final   Special Requests NONE   Final   Gram Stain     Final   Value: NO WBC SEEN     NO SQUAMOUS EPITHELIAL CELLS SEEN     MODERATE GRAM POSITIVE RODS     MODERATE GRAM POSITIVE COCCI     IN PAIRS FEW GRAM NEGATIVE RODS   Culture     Final   Value:  MULTIPLE ORGANISMS PRESENT, NONE PREDOMINANT     Note:  NO STAPHYLOCOCCUS AUREUS ISOLATED NO GROUP A STREP (S.PYOGENES) ISOLATED   Report Status 10/05/2012 FINAL   Final  ANAEROBIC CULTURE     Status: None   Collection Time    10/02/12  1:55 PM      Result Value Range Status   Specimen Description ABSCESS GALL BLADDER   Final   Special Requests NONE   Final   Gram Stain     Final   Value: NO WBC SEEN     NO SQUAMOUS EPITHELIAL CELLS SEEN     MODERATE GRAM POSITIVE RODS     MODERATE GRAM POSITIVE COCCI     IN PAIRS FEW GRAM NEGATIVE RODS   Culture     Final   Value: NO ANAEROBES ISOLATED; CULTURE IN PROGRESS FOR 5 DAYS   Report Status PENDING   Incomplete  MRSA PCR SCREENING     Status: None   Collection Time    10/02/12  2:09 PM      Result Value Range Status   MRSA by PCR NEGATIVE  NEGATIVE Final   Comment:            The GeneXpert MRSA Assay (FDA     approved for NASAL specimens     only), is one component of a     comprehensive MRSA colonization     surveillance program. It is not     intended to diagnose MRSA     infection nor to guide or     monitor treatment for     MRSA infections.  CULTURE, BLOOD (ROUTINE X 2)     Status: None   Collection Time    10/02/12  2:30 PM      Result Value Range Status   Specimen Description BLOOD RIGHT ARM   Final   Special Requests BOTTLES DRAWN AEROBIC AND ANAEROBIC 10CC   Final   Culture  Setup Time 10/02/2012 21:33   Final   Culture     Final   Value:        BLOOD CULTURE RECEIVED NO GROWTH TO DATE CULTURE WILL BE HELD FOR 5 DAYS BEFORE ISSUING A FINAL NEGATIVE REPORT   Report Status PENDING   Incomplete  CULTURE, BLOOD (ROUTINE X 2)     Status: None   Collection Time    10/02/12  2:40 PM      Result Value Range Status   Specimen Description BLOOD RIGHT ARM   Final   Special Requests BOTTLES DRAWN AEROBIC AND ANAEROBIC 10CC   Final   Culture  Setup Time 10/02/2012 21:33   Final   Culture     Final   Value:        BLOOD CULTURE  RECEIVED NO GROWTH TO DATE CULTURE WILL BE HELD FOR 5 DAYS BEFORE ISSUING A FINAL NEGATIVE REPORT   Report Status PENDING   Incomplete     Studies: Mr Shirlee Latch ZO Contrast  10/05/2012   *RADIOLOGY REPORT*  Clinical Data:  Right arm weakness and abnormal speech.  MRI HEAD WITHOUT CONTRAST MRA HEAD WITHOUT CONTRAST  Technique:  Multiplanar, multiecho pulse sequences of the brain and surrounding structures were obtained without intravenous contrast. Angiographic images of the head were obtained using MRA technique without contrast.  Comparison:  MRI brain at [facility] 12/15/2011.  MRI HEAD  Findings:  Scattered areas of restricted diffusion are evident within a watershed distribution between the left ACA and MCA territories.  T2 hyperintensities are associated with the areas of acute infarction.  Mild generalized atrophy is otherwise stable.  No hemorrhage or mass lesion is evident.  The ventricles are of normal size.  A remote lacunar infarct of the right caudate head is stable.  Marrow signal in the upper cervical spine and calvarium is decreased relative to the prior study.  No discrete lesions are evident. Abnormal signal is present within the left internal carotid arteries suggesting occlusion or high-grade stenosis.  The flow is present in the right internal carotid artery and the posterior circulation.  The globes and orbits are intact.  Minimal mucosal thickening is present throughout the paranasal sinuses.  No fluid levels are present.  The mastoid air cells are clear.  IMPRESSION:  1. Scattered acute / subacute non hemorrhagic infarcts within a watershed territory between the left ACA and MCA distributions. 2.  Occluded or extremely slow flow within the left internal carotid artery at the skull base. 3.  Stable atrophy and minimal white matter disease otherwise. 4.  Decreased T1 marrow signal within the upper cervical spine and calvarium.  This is compatible with the patient's known anemia.   MRA HEAD  Findings: Markedly diminished signal is evident within the left internal carotid artery, particularly within the cavernous segment. There is diminished signal in the left A1 segment compared the right.  The left M1 segment is less robust than on the right with more pronounced segmental attenuation of distal left MCA branch vessels.  The right vertebral artery is slightly dominant to the left.  The PICA origins are visualized and normal.  The basilar artery is within normal limits.  Both posterior cerebral arteries originate from the basilar tip.  MRA signal is evident into much more distal left PCA branch vessels.  There is attenuation of right-sided PCA branch vessels.  IMPRESSION:  1.  Markedly diminished signal within the left internal carotid artery and branch vessels.  This corresponds to the area of infarction and raises concern for mil proximal stenosis. 2.  There may be a high-grade stenosis within the cavernous segment. 3.  Prominent left PCA branch vessels.  This is likely related to attempts at collateral flow.  These results were called by telephone on 10/05/2012 at 08:05 p.m. to Tana Conch, R.N., [facility] 6NT, who verbally acknowledged these results.   Original Report Authenticated By: Marin Roberts, M.D.   Mr Brain Wo Contrast  10/05/2012   *RADIOLOGY REPORT*  Clinical Data:  Right arm weakness and abnormal speech.  MRI HEAD WITHOUT CONTRAST MRA HEAD WITHOUT CONTRAST  Technique:  Multiplanar, multiecho pulse sequences of the brain and surrounding structures were obtained without intravenous contrast. Angiographic images of the head were obtained using MRA technique without contrast.  Comparison:  MRI brain at [facility] 12/15/2011.  MRI HEAD  Findings:  Scattered areas of restricted diffusion are evident within a watershed distribution between the left ACA and MCA territories.  T2 hyperintensities are associated with the areas of acute infarction.  Mild generalized atrophy is  otherwise stable.  No hemorrhage or mass lesion is evident.  The ventricles are of normal size.  A remote lacunar infarct of the right caudate head is stable.  Marrow signal in the upper cervical spine and calvarium is decreased relative to the prior study.  No discrete lesions are evident. Abnormal signal is present within the left internal carotid arteries suggesting occlusion or high-grade stenosis.  The flow is present in the right internal carotid artery and the posterior circulation.  The globes and orbits are intact.  Minimal mucosal thickening  is present throughout the paranasal sinuses.  No fluid levels are present.  The mastoid air cells are clear.  IMPRESSION:  1. Scattered acute / subacute non hemorrhagic infarcts within a watershed territory between the left ACA and MCA distributions. 2.  Occluded or extremely slow flow within the left internal carotid artery at the skull base. 3.  Stable atrophy and minimal white matter disease otherwise. 4.  Decreased T1 marrow signal within the upper cervical spine and calvarium.  This is compatible with the patient's known anemia.  MRA HEAD  Findings: Markedly diminished signal is evident within the left internal carotid artery, particularly within the cavernous segment. There is diminished signal in the left A1 segment compared the right.  The left M1 segment is less robust than on the right with more pronounced segmental attenuation of distal left MCA branch vessels.  The right vertebral artery is slightly dominant to the left.  The PICA origins are visualized and normal.  The basilar artery is within normal limits.  Both posterior cerebral arteries originate from the basilar tip.  MRA signal is evident into much more distal left PCA branch vessels.  There is attenuation of right-sided PCA branch vessels.  IMPRESSION:  1.  Markedly diminished signal within the left internal carotid artery and branch vessels.  This corresponds to the area of infarction and raises  concern for mil proximal stenosis. 2.  There may be a high-grade stenosis within the cavernous segment. 3.  Prominent left PCA branch vessels.  This is likely related to attempts at collateral flow.  These results were called by telephone on 10/05/2012 at 08:05 p.m. to Tana Conch, R.N., [facility] 6NT, who verbally acknowledged these results.   Original Report Authenticated By: Marin Roberts, M.D.    Scheduled Meds: . antiseptic oral rinse  15 mL Mouth Rinse q12n4p  . aspirin  325 mg Oral Daily  . bisoprolol  10 mg Oral Daily  . ciprofloxacin  500 mg Oral BID  . clopidogrel  75 mg Oral Daily  . docusate sodium  100 mg Oral Daily  . enoxaparin (LOVENOX) injection  40 mg Subcutaneous Q24H  . estrogens (conjugated)  0.625 mg Oral BID  . furosemide  40 mg Oral Daily  . lisinopril  5 mg Oral Daily  . mineral oil   Oral Q2000  . OxyCODONE  20 mg Oral Custom  . pantoprazole  40 mg Oral Q1200  . polyethylene glycol  17 g Oral TID  . potassium chloride  40 mEq Oral BID  . senna  1 tablet Oral TID  . thyroid  150 mg Oral QAC breakfast   Continuous Infusions:    Principal Problem:   Benign hypertensive heart disease without heart failure Active Problems:   Hypothyroidism   Polycythemia vera(238.4)   Fibromyalgia   CAD (coronary artery disease)   Cholelithiases   Unspecified constipation   Septic shock   CVA (cerebral infarction)    Time spent: 25    Marlin Canary  [facility] Pager [phone]. If 7PM-7AM, please contact night-coverage at [email or web address], password [facility] 10/07/2012, 9:38 AM  LOS: 7 days

## 2012-10-07 NOTE — Consult Note (Signed)
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 year old female who was admitted 7/5 and found to have cholecystitis.  She became septic requiring ICU admission 7/7 and subsequently had cholecystostomy tube placed.  She was improving until 48 hours ago developed slurred speech broca's aphasia and right arm weakness.  She was found on work up to have left carotid stenosis approaching 80% by duplex and 40-60% right ICA stenosis.  Pt slurred speech has resolved but still struggles to find some words.  She still had significant right hand weakness but is improved.  Prior stroke with left eye deficit years ago thought to be due to her polycythemia hyperviscosity.  She denies recent chest pain or shortness of breath.  Her cardiologist is Dr Patty Sermons.  Other medical problems include hypertension which is controlled.  She denies prior history of tobacco use or diabetes.  She has no family history of early stroke MI or vascular disease.  She is currently on aspirin and plavix.  She is also currently on Cipro.  Past Medical History  Diagnosis Date  . 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 Fibromyalgia   . Hypothyroidism   . Essential hypertension   . Postmenopausal state     on hormone replacement therapy  . Aortic valve sclerosis     by echocardiogram 12/04/2009  . PAC (premature atrial contraction)     history of  . Coronary artery disease   . Hyperlipidemia   . History of echocardiogram 12/04/2009    showed mild ventricle hypertrophy and normal EF at 55-60% -- mild aortic valve sclerosis  -- trace mitral regurgitation -- slightly pulmonary artery pressure at 47  --  [name], M.D.   . Fatigue     History reviewed. No pertinent past surgical history.   Social History History  Substance Use Topics  . Smoking status:  Never Smoker   . Smokeless tobacco: Not on file  . Alcohol Use: No    Family History Family History  Problem Relation Age of Onset  . Heart attack Father   . Coronary artery disease Mother     had aortic valve replacement    Allergies  Allergies  Allergen Reactions  . Calcium Channel Blockers   . Codeine   . Penicillins   . Synthroid (Levothyroxine Sodium)      Current Facility-Administered Medications  Medication Dose Route Frequency Provider Last Rate Last Dose  . acetaminophen (TYLENOL) tablet 650 mg  650 mg Oral Q4H PRN Wilmon Arms. Tsuei, MD   650 mg at 10/02/12 0522  . albuterol-ipratropium (COMBIVENT) inhaler 2 puff  2 puff Inhalation Q6H PRN Wilmon Arms. Tsuei, MD      . ALPRAZolam [name]) tablet 0.5 mg  0.5 mg Oral Q6H PRN Wilmon Arms. Tsuei, MD   0.5 mg at 10/06/12 2138  . antiseptic oral rinse (BIOTENE) solution 15 mL  15 mL Mouth Rinse q12n4p Josph Macho, MD   15 mL at 10/06/12 1218  . aspirin tablet 325 mg  325 mg Oral Daily Gerome Apley [place], PA-C   325 mg at 10/07/12 0940  . bisoprolol (ZEBETA) tablet 10 mg  10 mg Oral Daily Letha Cape, PA-C   10 mg at 10/07/12 0940  . ciprofloxacin (CIPRO) tablet 500 mg  500 mg Oral BID Letha Cape, PA-C  500 mg at 10/07/12 0737  . clopidogrel (PLAVIX) tablet 75 mg  75 mg Oral Daily Layne Benton, NP   75 mg at 10/07/12 0940  . cyclobenzaprine (FLEXERIL) tablet 10 mg  10 mg Oral BID PRN Wilmon Arms. Tsuei, MD   10 mg at 10/06/12 2138  . diphenhydrAMINE (BENADRYL) injection 12.5 mg  12.5 mg Intravenous Q6H PRN Wilmon Arms. Tsuei, MD      . docusate sodium (COLACE) capsule 100 mg  100 mg Oral Daily Leroy Sea, MD   100 mg at 10/07/12 0948  . enoxaparin (LOVENOX) injection 40 mg  40 mg Subcutaneous Q24H Letha Cape, PA-C   40 mg at 10/07/12 1151  . estrogens (conjugated) (PREMARIN) tablet 0.625 mg  0.625 mg Oral BID Wilmon Arms. Tsuei, MD   0.625 mg at 10/07/12 0941  . furosemide (LASIX) tablet 40 mg  40 mg Oral Daily  Joseph Art, DO   40 mg at 10/07/12 0940  . lisinopril (PRINIVIL,ZESTRIL) tablet 5 mg  5 mg Oral Daily Joseph Art, DO   5 mg at 10/07/12 0941  . mineral oil liquid   Oral Q2000 Jessica U Vann, DO      . ondansetron (ZOFRAN) injection 4 mg  4 mg Intravenous Q6H PRN Wilmon Arms. Tsuei, MD   4 mg at 09/30/12 1451  . oxyCODONE (Oxy IR/ROXICODONE) immediate release tablet 5-10 mg  5-10 mg Oral Q4H PRN Emina Riebock, NP   10 mg at 10/04/12 1618  . OxyCODONE (OXYCONTIN) 12 hr tablet 20 mg  20 mg Oral Custom Emina Riebock, NP   20 mg at 10/07/12 1151  . pantoprazole (PROTONIX) EC tablet 40 mg  40 mg Oral Q1200 Crystal Stillinger Robertson, RPH   40 mg at 10/07/12 1151  . polyethylene glycol (MIRALAX / GLYCOLAX) packet 17 g  17 g Oral TID Joseph Art, DO   17 g at 10/07/12 0941  . potassium chloride SA (K-DUR,KLOR-CON) CR tablet 40 mEq  40 mEq Oral BID Letha Cape, PA-C   40 mEq at 10/07/12 0940  . senna (SENOKOT) tablet 8.6 mg  1 tablet Oral TID Joseph Art, DO   8.6 mg at 10/07/12 0941  . temazepam (RESTORIL) capsule 15 mg  15 mg Oral QHS PRN Josph Macho, MD   15 mg at 10/06/12 2138  . thyroid (ARMOUR) tablet 150 mg  150 mg Oral QAC breakfast Emelia Loron, MD   150 mg at 10/07/12 0737    ROS:   General:  No weight loss, Fever, chills  HEENT: No recent headaches, no nasal bleeding, no visual changes, no sore throat  Neurologic: No dizziness, blackouts, seizures.   Cardiac: No recent episodes of chest pain/pressure, no shortness of breath at rest.  + shortness of breath with exertion.  Denies history of atrial fibrillation or irregular heartbeat  Vascular: No history of rest pain in feet.  No history of claudication.  No history of non-healing ulcer, No history of DVT   Pulmonary: No home oxygen, no productive cough, no hemoptysis,  No asthma or wheezing  Musculoskeletal:  [ ]  Arthritis, [ ]  Low back pain,  [ ]  Joint pain  Hematologic:+ history of hypercoagulable state.   No history of easy bleeding.  + history of anemia  Gastrointestinal: No hematochezia or melena,  No gastroesophageal reflux, no trouble swallowing  Urinary: [ ]  chronic Kidney disease, [ ]  on HD - [ ]  MWF or [ ]  TTHS, [ ]   Burning with urination, [ ]  Frequent urination, [ ]  Difficulty urinating;   Skin: No rashes  Psychological: No history of anxiety,  No history of depression   Physical Examination  Filed Vitals:   10/05/12 2030 10/06/12 0622 10/06/12 1330 10/07/12 0509  BP: 134/52 130/62 133/62 115/60  Pulse: 115 84 71 75  Temp:  98.1 F (36.7 C) 97.7 F (36.5 C) 98.3 F (36.8 C)  TempSrc:  Oral Oral Oral  Resp:  17 16 18   Height:   5\' 3"  (1.6 m) 5\' 3"  (1.6 m)  Weight:   164 lb (74.39 kg) 164 lb 0.7 oz (74.41 kg)  SpO2: 100% 100% 97% 100%    Body mass index is 29.07 kg/(m^2).  General:  Alert and oriented, no acute distress HEENT: Normal no facial assymmetry Neck: No bruit or JVD Pulmonary: Clear to auscultation bilaterally Cardiac: Regular Rate and Rhythm Abdomen: Soft, mildly tender around cholecystostomy tube, non-distended, no mass Skin: No rash Extremity Pulses:  2+ radial, brachial, absent dorsalis pedis, posterior tibial pulses bilaterally Musculoskeletal: No deformity 1-2 plus pitting edema right forearm/hand  Neurologic: Upper and lower extremity motor 5/5 on left 5/5 right leg, 4/5 right hand arm extension flexion, discoordination of hand intrinsics  DATA: MRI/MRA brain, multiple areas of watershed infarct left brain ACA MCA. Decreased flow left ICA.  Duplex reviewed Left ICA stenosis and right stenosis as above, verts antegrade   ASSESSMENT: Symptomatic left ICA stenosis.  Pt would benefit from left CEA high risk for multiple reasons polycythemia, recent infection, recent stroke.  However this event may have evolved from low flow state with hypotension and with pending gallbladder surgery this is probably the most appropriate window to reduce her overall  recurrent stroke risk   I discussed with the patient and her husbandthe risks of carotid endarterectomy including but not limited to myocardial events 5%, cranial nerve injury 10-15%, stroke 3-5%, bleeding or infection 1%  I also discussed the benefits of long term stroke prevention and the advantage compared to medical therapy  The patient is aware of the risks and agrees to proceed forward with the procedure.   PLAN:  1. Please see if Dr [name] or Patty Sermons have any further preop recommendations  2. Left CEA on Tuesday using saphenous vein patch to decrease infection risk in light of recent septic episode  Would prefer that the pt stay with her current medical service pre and post as the husband has expressed wishes that Dr Benjamine Mola has spent some time learning the patients medical issues and he would prefer for her to be the primary coordinator of her care.  Fabienne Bruns, MD [facility] [place] Office: [phone] Pager: [phone]

## 2012-10-07 NOTE — Progress Notes (Signed)
Physical Therapy Treatment Patient Details Name: [name] MRN: [id number] DOB: [birth year] Today's Date: 10/07/2012 Time: 4098-1191 PT Time Calculation (min): 13 min  PT Assessment / Plan / Recommendation  PT Comments   Pt able to increase gait speed and distance but continues to c/o fatigue.  Continue to recommend HHPT at d/c.   Follow Up Recommendations  Home health PT;Supervision for mobility/OOB     Does the patient have the potential to tolerate intense rehabilitation     Barriers to Discharge        Equipment Recommendations  None recommended by PT    Recommendations for Other Services    Frequency Min 3X/week   Progress towards PT Goals Progress towards PT goals: Progressing toward goals  Plan Current plan remains appropriate    Precautions / Restrictions Precautions Precautions: Fall Restrictions Weight Bearing Restrictions: No   Pertinent Vitals/Pain No c/o pain    Mobility  Bed Mobility Bed Mobility: Supine to Sit;Sitting - Scoot to Edge of Bed;Sit to Supine Supine to Sit: 3: Mod assist;With rails Sitting - Scoot to Edge of Bed: 5: Supervision Sit to Supine: 4: Min guard;HOB flat Details for Bed Mobility Assistance: Assist for elevating trunk OOB. Transfers Transfers: Sit to Stand;Stand to Sit Sit to Stand: 4: Min guard;From bed;With upper extremity assist Stand to Sit: 4: Min guard;To bed;With upper extremity assist Details for Transfer Assistance: Minguard for safety and cues for technique Ambulation/Gait Ambulation/Gait Assistance: 4: Min guard Ambulation Distance (Feet): 400 Feet Assistive device: 1 person hand held assist Ambulation/Gait Assistance Details: Cues for upright posture and step squence.  Increaese gait speed noted from caregiver this session. Gait Pattern: Step-through pattern Gait velocity: decrease Stairs: No Modified Rankin (Stroke Patients Only) Pre-Morbid Rankin Score: No symptoms Modified Rankin: Moderate disability     Exercises     PT Diagnosis:    PT Problem List:   PT Treatment Interventions:     PT Goals (current goals can now be found in the care plan section) Acute Rehab PT Goals Patient Stated Goal: Home with husband PT Goal Formulation: With patient Time For Goal Achievement: 10/13/12 Potential to Achieve Goals: Good  Visit Information  Last PT Received On: 10/07/12 Assistance Needed: +1 PT/OT Co-Evaluation/Treatment: Yes History of Present Illness: pt admitted with epigatric pain, N/V.  Pt found to have cholecystitis,  Perc. drain placed.  Complications from polycythemia and Hgb level may have contributed to a new watershed CVA with R side weakness  Upper > lower and slurred speech.    Subjective Data  Subjective: "Sure I'll go for a small walk." Patient Stated Goal: Home with husband   Cognition  Cognition Arousal/Alertness: Awake/alert Behavior During Therapy: WFL for tasks assessed/performed Overall Cognitive Status: Impaired/Different from baseline Area of Impairment: Safety/judgement Safety/Judgement: Decreased awareness of deficits General Comments: Pt unaware of RUE position or placement at times.     Balance  Balance Balance Assessed: Yes Static Sitting Balance Static Sitting - Balance Support: No upper extremity supported;Feet supported Static Sitting - Level of Assistance: 5: Stand by assistance Static Standing Balance Static Standing - Balance Support: No upper extremity supported Static Standing - Level of Assistance: 5: Stand by assistance;4: Min assist Static Standing - Comment/# of Minutes: Close guarding for safety while standing 1-2 minutes.  End of Session PT - End of Session Equipment Utilized During Treatment: Gait belt Activity Tolerance: Patient tolerated treatment well Patient left: in bed;with call bell/phone within reach Nurse Communication: Mobility status   GP  [name] 10/07/2012, 5:25 PM  [name], PT DPT [phone]

## 2012-10-07 NOTE — Progress Notes (Signed)
Stroke Team Progress Note  HISTORY [name] is an 67 y.o. female with a history of polycythemia, hypertension and hyperlipidemia who was admitted on 09/30/2012 with acute cholecystitis. She status post drain placement. She experienced sudden onset of weakness involving her right hand as well as change in speech at around noon on 10/04/2012 in the hospital. MRI of her brain performed 10/05/2012 showed left MCA/ACA watershed infarcts. MRA showed markedly diminished flow and possible obstruction of left ICA. Patient has been taking aspirin and Plavix since admission. NIH stroke score was 2. Patient was not a TPA candidate secondary to recent drain placement and lack of recognition of acute stroke.  SUBJECTIVE Her husbnad is at the bedside.  Overall she feels her condition is stable - she is having difficulty getting her words out and finding the right words. Some undulations in symptoms is noted by the patient.  OBJECTIVE Most recent Vital Signs: Filed Vitals:   10/05/12 2030 10/06/12 0622 10/06/12 1330 10/07/12 0509  BP: 134/52 130/62 133/62 115/60  Pulse: 115 84 71 75  Temp:  98.1 F (36.7 C) 97.7 F (36.5 C) 98.3 F (36.8 C)  TempSrc:  Oral Oral Oral  Resp:  17 16 18   Height:   5\' 3"  (1.6 m) 5\' 3"  (1.6 m)  Weight:   164 lb (74.39 kg) 164 lb 0.7 oz (74.41 kg)  SpO2: 100% 100% 97% 100%   CBG (last 3)  No results found for this basename: GLUCAP,  in the last 72 hours  IV Fluid Intake:      MEDICATIONS  . antiseptic oral rinse  15 mL Mouth Rinse q12n4p  . aspirin  325 mg Oral Daily  . bisoprolol  10 mg Oral Daily  . ciprofloxacin  500 mg Oral BID  . clopidogrel  75 mg Oral Daily  . docusate sodium  100 mg Oral Daily  . enoxaparin (LOVENOX) injection  40 mg Subcutaneous Q24H  . estrogens (conjugated)  0.625 mg Oral BID  . furosemide  40 mg Oral Daily  . lisinopril  5 mg Oral Daily  . mineral oil   Oral Q2000  . OxyCODONE  20 mg Oral Custom  . pantoprazole  40 mg Oral Q1200   . polyethylene glycol  17 g Oral TID  . potassium chloride  40 mEq Oral BID  . senna  1 tablet Oral TID  . thyroid  150 mg Oral QAC breakfast   PRN:  acetaminophen, albuterol-ipratropium, ALPRAZolam, cyclobenzaprine, diphenhydrAMINE, ondansetron, oxyCODONE, temazepam  Diet:  Fat Restricted thin liquids Activity:  Ambulate in hall DVT Prophylaxis:  Lovenox 40 mg sq daily   CLINICALLY SIGNIFICANT STUDIES Basic Metabolic Panel:  Recent Labs Lab 10/02/12 0505 10/03/12 0400  10/05/12 1000 10/06/12 1155  NA 134* 135  < > 134* 136  K 4.6 3.9  < > 3.9 4.4  CL 99 102  < > 97 101  CO2 24 24  < > 27 31  GLUCOSE 110* 127*  < > 134* 108*  BUN 13 15  < > 8 7  CREATININE 1.02 0.80  < > 0.60 0.59  CALCIUM 8.7 8.6  < > 8.6 8.3*  MG 2.1 2.3  --   --   --   < > = values in this interval not displayed. Liver Function Tests:   Recent Labs Lab 10/03/12 0400 10/04/12 1309  AST 13 13  ALT 9 9  ALKPHOS 61 46  BILITOT 0.4 0.4  PROT 6.0 5.6*  ALBUMIN 2.7*  2.5*   CBC:  Recent Labs Lab 10/03/12 0400  10/05/12 1000 10/06/12 1155 10/07/12 0700  WBC 19.9*  < > 11.7* 8.6 8.1  NEUTROABS 17.7*  --  9.7*  --   --   HGB 5.2*  < > 7.1* 6.4* 6.1*  HCT 21.8*  < > 26.7* 24.4* 23.5*  MCV 62.6*  < > 64.5* 65.6* 66.0*  PLT 222  < > 231 221 196  < > = values in this interval not displayed. Coagulation:   Recent Labs Lab 10/02/12 1021  LABPROT 18.1*  INR 1.54*   Cardiac Enzymes:   Recent Labs Lab 09/30/12 2334 10/01/12 0500 10/04/12 1323  TROPONINI <0.30 <0.30 <0.30   Urinalysis:   Recent Labs Lab 10/01/12 2104 10/05/12 1638  COLORURINE AMBER* YELLOW  LABSPEC 1.018 1.013  PHURINE 5.5 5.0  GLUCOSEU NEGATIVE NEGATIVE  HGBUR TRACE* NEGATIVE  BILIRUBINUR NEGATIVE NEGATIVE  KETONESUR NEGATIVE NEGATIVE  PROTEINUR 30* NEGATIVE  UROBILINOGEN 0.2 0.2  NITRITE NEGATIVE NEGATIVE  LEUKOCYTESUR LARGE* TRACE*   Lipid Panel    Component Value Date/Time   CHOL 58 10/07/2012 0700    TRIG 121 10/07/2012 0700   HDL 11* 10/07/2012 0700   CHOLHDL 5.3 10/07/2012 0700   VLDL 24 10/07/2012 0700   LDLCALC 23 10/07/2012 0700   HgbA1C  No results found for this basename: HGBA1C    Urine Drug Screen:   No results found for this basename: labopia,  cocainscrnur,  labbenz,  amphetmu,  thcu,  labbarb    Alcohol Level: No results found for this basename: ETH,  in the last 168 hours   CT of the brain    MRI of the brain  10/05/2012    1. Scattered acute / subacute non hemorrhagic infarcts within a watershed territory between the left ACA and MCA distributions. 2.  Occluded or extremely slow flow within the left internal carotid artery at the skull base. 3.  Stable atrophy and minimal white matter disease otherwise. 4.  Decreased T1 marrow signal within the upper cervical spine and calvarium.  This is compatible with the patient's known anemia.   MRA of the brain  10/05/2012    1.  Markedly diminished signal within the left internal carotid artery and branch vessels.  This corresponds to the area of infarction and raises concern for mil proximal stenosis. 2.  There may be a high-grade stenosis within the cavernous segment. 3.  Prominent left PCA branch vessels.  This is likely related to attempts at collateral flow.    2D Echocardiogram  EF 65-70% with no source of embolus.   Carotid Doppler   Carotid duplex completed.  Preliminary report: Right :: 40-59% proximal internal carotid artery stenosis with turbulent flow.. Left - There is a 60% to 79% highest end of scale ICA stenosis in the bulb with turbulent flow throughout the ICA. Bilateral - Vertebral artery flow is antegrade.  CXR  10/04/2012    Slightly better aeration.  Stable cardiomegaly.  No definite pulmonary vascular congestion.   EKG  sinus bradycardia.   Therapy Recommendations home health PT Physical Exam  General: The patient is alert and cooperative at the time of the examination.  Skin: No significant peripheral edema  is noted.   Neurologic Exam  Cranial nerves: Facial symmetry is present. Dysarthria is noted, speech is slow, deliberate, some word finding issues. Extraocular movements are full. Visual fields are full.  Motor: The patient has good strength in all 4 extremities, with exception of slight weakness  of the right upper extremity.  Coordination: The patient has good finger-nose-finger and heel-to-shin bilaterally.  Gait and station: The gait was not tested. The patient has mild right upper extremity drift.  Reflexes: Deep tendon reflexes are symmetric.   ASSESSMENT [name] is a 67 y.o. female who developed right arm weakness and difficulty with speech while hospitalized for a gallbladder attack. 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 While polycythemia can cause stroke, given the distribution of her infarct, it is most likely due to hypoperfusion in the setting of carotid stenosis; it may have been worsened by hypotension. Carotid dopplers will confirm or refute our suspicions.  On aspirin 325 mg orally every day and clopidogrel 75 mg orally every day prior to admission. Now on aspirin 325 mg orally every day for secondary stroke prevention. Patient with resultant right hemiparesis and expressive aphasia. Work up underway.   Acute cholecystitis  Polycythemia vera Hypertension CAD  Hyperlipidemia, LDL 60, on no statin PTA, now on no statin, at goal LDL < 100   Hospital day # 7  TREATMENT/PLAN  Continue aspirin 325 mg orally every day and clopidogrel 75 mg orally every day for secondary stroke prevention as prior to admission. Blood letting for polycythemia not likely to help, as cause of stroke is likely hemodynamic from proximal carotid stenosis which may need revascularization.  Vascular surgery consult Left ICA stenosis  Of note, patient has been seen by a [facility]  neurologist within the past year - Loyal Gambler - [phone]  10/07/2012 10:16 AM

## 2012-10-08 DIAGNOSIS — K810 Acute cholecystitis: Secondary | ICD-10-CM

## 2012-10-08 LAB — CULTURE, BLOOD (ROUTINE X 2): Culture: NO GROWTH

## 2012-10-08 LAB — CBC
MCV: 65.8 fL — ABNORMAL LOW (ref 78.0–100.0)
Platelets: 222 10*3/uL (ref 150–400)
RBC: 3.92 MIL/uL (ref 3.87–5.11)
RDW: 26.9 % — ABNORMAL HIGH (ref 11.5–15.5)
WBC: 8.7 10*3/uL (ref 4.0–10.5)

## 2012-10-08 MED ORDER — FUROSEMIDE 10 MG/ML IJ SOLN
20.0000 mg | Freq: Once | INTRAMUSCULAR | Status: AC
  Administered 2012-10-08: 20 mg via INTRAVENOUS

## 2012-10-08 NOTE — Progress Notes (Signed)
Stroke Team Progress Note  HISTORY [name] is an 67 y.o. female with a history of polycythemia, hypertension and hyperlipidemia who was admitted on 09/30/2012 with acute cholecystitis. She status post drain placement. She experienced sudden onset of weakness involving her right hand as well as change in speech at around noon on 10/04/2012 in the hospital. MRI of her brain performed 10/05/2012 showed left MCA/ACA watershed infarcts. MRA showed markedly diminished flow and possible obstruction of left ICA. Patient has been taking aspirin and Plavix since admission. NIH stroke score was 2. Patient was not a TPA candidate secondary to recent drain placement and lack of recognition of acute stroke.  SUBJECTIVE Her husbnad is at the bedside.  Overall she feels her condition is stable - she is having difficulty getting her words out and finding the right words. Some undulations in symptoms is noted by the patient.  OBJECTIVE Most recent Vital Signs: Filed Vitals:   10/07/12 0509 10/07/12 1300 10/07/12 2111 10/08/12 0540  BP: 115/60 103/47 129/55 108/52  Pulse: 75 75 63 72  Temp: 98.3 F (36.8 C) 98.1 F (36.7 C) 99 F (37.2 C) 98.4 F (36.9 C)  TempSrc: Oral Oral Oral Oral  Resp: 18 15 16 18   Height: 5\' 3"  (1.6 m)     Weight: 164 lb 0.7 oz (74.41 kg)     SpO2: 100% 100% 97% 91%   CBG (last 3)  No results found for this basename: GLUCAP,  in the last 72 hours  IV Fluid Intake:      MEDICATIONS  . antiseptic oral rinse  15 mL Mouth Rinse q12n4p  . aspirin  325 mg Oral Daily  . bisoprolol  10 mg Oral Daily  . ciprofloxacin  500 mg Oral BID  . clopidogrel  75 mg Oral Daily  . docusate sodium  100 mg Oral Daily  . enoxaparin (LOVENOX) injection  40 mg Subcutaneous Q24H  . estrogens (conjugated)  0.625 mg Oral BID  . furosemide  40 mg Oral Daily  . lisinopril  5 mg Oral Daily  . mineral oil   Oral Q2000  . OxyCODONE  20 mg Oral Custom  . pantoprazole  40 mg Oral Q1200  .  polyethylene glycol  17 g Oral TID  . potassium chloride  40 mEq Oral BID  . senna  1 tablet Oral TID  . thyroid  150 mg Oral QAC breakfast   PRN:  acetaminophen, albuterol-ipratropium, ALPRAZolam, cyclobenzaprine, diphenhydrAMINE, ondansetron, oxyCODONE, temazepam  Diet:  Fat Restricted thin liquids Activity:  Ambulate in hall DVT Prophylaxis:  Lovenox 40 mg sq daily   CLINICALLY SIGNIFICANT STUDIES Basic Metabolic Panel:  Recent Labs Lab 10/02/12 0505 10/03/12 0400  10/05/12 1000 10/06/12 1155  NA 134* 135  < > 134* 136  K 4.6 3.9  < > 3.9 4.4  CL 99 102  < > 97 101  CO2 24 24  < > 27 31  GLUCOSE 110* 127*  < > 134* 108*  BUN 13 15  < > 8 7  CREATININE 1.02 0.80  < > 0.60 0.59  CALCIUM 8.7 8.6  < > 8.6 8.3*  MG 2.1 2.3  --   --   --   < > = values in this interval not displayed. Liver Function Tests:   Recent Labs Lab 10/03/12 0400 10/04/12 1309  AST 13 13  ALT 9 9  ALKPHOS 61 46  BILITOT 0.4 0.4  PROT 6.0 5.6*  ALBUMIN 2.7* 2.5*  CBC:  Recent Labs Lab 10/03/12 0400  10/05/12 1000  10/07/12 0700 10/08/12 0630  WBC 19.9*  < > 11.7*  < > 8.1 8.7  NEUTROABS 17.7*  --  9.7*  --   --   --   HGB 5.2*  < > 7.1*  < > 6.1* 6.8*  HCT 21.8*  < > 26.7*  < > 23.5* 25.8*  MCV 62.6*  < > 64.5*  < > 66.0* 65.8*  PLT 222  < > 231  < > 196 222  < > = values in this interval not displayed. Coagulation:   Recent Labs Lab 10/02/12 1021  LABPROT 18.1*  INR 1.54*   Cardiac Enzymes:   Recent Labs Lab 10/04/12 1323  TROPONINI <0.30   Urinalysis:   Recent Labs Lab 10/01/12 2104 10/05/12 1638  COLORURINE AMBER* YELLOW  LABSPEC 1.018 1.013  PHURINE 5.5 5.0  GLUCOSEU NEGATIVE NEGATIVE  HGBUR TRACE* NEGATIVE  BILIRUBINUR NEGATIVE NEGATIVE  KETONESUR NEGATIVE NEGATIVE  PROTEINUR 30* NEGATIVE  UROBILINOGEN 0.2 0.2  NITRITE NEGATIVE NEGATIVE  LEUKOCYTESUR LARGE* TRACE*   Lipid Panel    Component Value Date/Time   CHOL 58 10/07/2012 0700   TRIG 121  10/07/2012 0700   HDL 11* 10/07/2012 0700   CHOLHDL 5.3 10/07/2012 0700   VLDL 24 10/07/2012 0700   LDLCALC 23 10/07/2012 0700   HgbA1C  Lab Results  Component Value Date   HGBA1C 5.5 10/07/2012    Urine Drug Screen:   No results found for this basename: labopia,  cocainscrnur,  labbenz,  amphetmu,  thcu,  labbarb    Alcohol Level: No results found for this basename: ETH,  in the last 168 hours   CT of the brain    MRI of the brain  10/05/2012    1. Scattered acute / subacute non hemorrhagic infarcts within a watershed territory between the left ACA and MCA distributions. 2.  Occluded or extremely slow flow within the left internal carotid artery at the skull base. 3.  Stable atrophy and minimal white matter disease otherwise. 4.  Decreased T1 marrow signal within the upper cervical spine and calvarium.  This is compatible with the patient's known anemia.   MRA of the brain  10/05/2012    1.  Markedly diminished signal within the left internal carotid artery and branch vessels.  This corresponds to the area of infarction and raises concern for mil proximal stenosis. 2.  There may be a high-grade stenosis within the cavernous segment. 3.  Prominent left PCA branch vessels.  This is likely related to attempts at collateral flow.    2D Echocardiogram  EF 65-70% with no source of embolus.   Carotid Doppler   Carotid duplex completed.  Preliminary report: Right :: 40-59% proximal internal carotid artery stenosis with turbulent flow.. Left - There is a 60% to 79% highest end of scale ICA stenosis in the bulb with turbulent flow throughout the ICA. Bilateral - Vertebral artery flow is antegrade.  CXR  10/04/2012    Slightly better aeration.  Stable cardiomegaly.  No definite pulmonary vascular congestion.   EKG  sinus bradycardia.   Therapy Recommendations home health PT Physical Exam  General: The patient is alert and cooperative at the time of the examination.  Skin: No significant peripheral  edema is noted.   Neurologic Exam  Cranial nerves: Facial symmetry is present. Dysarthria is noted, speech is slow, deliberate, some word finding issues. Extraocular movements are full. Visual fields are full.  Motor:  The patient has good strength in all 4 extremities, with exception of slight weakness of the right upper extremity.  Coordination: The patient has good finger-nose-finger and heel-to-shin bilaterally.  Gait and station: The gait was not tested. The patient has mild right upper extremity drift.  Reflexes: Deep tendon reflexes are symmetric.   ASSESSMENT [name] is a 67 y.o. female who developed right arm weakness and difficulty with speech while hospitalized for a gallbladder attack. 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 While polycythemia can cause stroke, given the distribution of her infarct, it is most likely due to hypoperfusion in the setting of carotid stenosis; it may have been worsened by hypotension. Carotid dopplers will confirm or refute our suspicions.  On aspirin 325 mg orally every day and clopidogrel 75 mg orally every day prior to admission. Now on aspirin 325 mg orally every day for secondary stroke prevention. Patient with resultant right hemiparesis and expressive aphasia. Work up underway.   Acute cholecystitis  Polycythemia vera Hypertension CAD  Hyperlipidemia, LDL 60, on no statin PTA, now on no statin, at goal LDL < 100   Hospital day # 8  TREATMENT/PLAN  Continue aspirin 325 mg orally every day and clopidogrel 75 mg orally every day for secondary stroke prevention as prior to admission. Blood letting for polycythemia not likely to help, as cause of stroke is likely hemodynamic from proximal carotid stenosis which may need revascularization.  Vascular surgery consult Left ICA stenosis Plan for CEA Tuesday.  Of note, patient has been  seen by a [facility] neurologist within the past year - Loyal Gambler - [phone]  10/08/2012 9:49 AM

## 2012-10-08 NOTE — Progress Notes (Signed)
[facility] PROGRESS NOTE  [name] JYN:[id number] DOB: 09-Jul-1945 DOA: 09/30/2012 PCP: [name], MD  Assessment/Plan: Weakness- +MRI for CVA - neurology consulted -appreciate their assistance (FLp- LDL 23, HDL 11, HgbA1C) - Pt/OT/SLP -Vascular surgery -- for CEA on Tuesday  - difficult situation with a complicated patient  Cholelithiasis-acute cholecystitis, patient had a percutaneous drain placed on 7/7, WBC has trended down, status post HIDA scan with cystic duct obstruction, surgery following,  Drain to stay in for 6-8 weeks then re-evaluation for cholecystecomy  abx per surgery- as of 7/11- should remain on  Chronic pain -trying to schedule as patient takes at home  Shortness of breath  Wean off O2 -resume lasix- give dose x 1 IV with labs in AM -echo ok   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 Should not get above 7 per hematology (Ennever, transfused one unit by Dr. Twanna Hy on 7/8, hemoglobin up to 6.2 ) Suspect with lab draws will slowly decrease to desired levels -Ennover ordered 500cc plebotomy- Hgb 6.1-6.8 do not think patient will need  Hypertension, discontinue HCTZ and d/c lisinopril  CAD Pt's Cardiologist note fm 09/28/2010 She does not have a history of known ischemic heart disease. She had an echocardiogram in 12/04/09 which showed normal left ventricular systolic function with an ejection fraction of 55-60% and normal diastolic function and mild aortic sclerosis.    Hypothyroidism; per patient and husband unable to take Synthroid. On Armour Thyroid 120 mg daily (patient has home meds) TSH suppressed; free t4 ok and will need to recheck as an outaptient  Constipation- added bowel regimen with enema- did not need enema     Code Status: full Family Communication: patient/husband- answered all the questions Disposition Plan: home health PT/OT/SLP/RN      HPI/Subjective: +BM No SOB, no CP Sitting in  chair   Objective: Filed Vitals:   10/07/12 0509 10/07/12 1300 10/07/12 2111 10/08/12 0540  BP: 115/60 103/47 129/55 108/52  Pulse: 75 75 63 72  Temp: 98.3 F (36.8 C) 98.1 F (36.7 C) 99 F (37.2 C) 98.4 F (36.9 C)  TempSrc: Oral Oral Oral Oral  Resp: 18 15 16 18   Height: 5\' 3"  (1.6 m)     Weight: 74.41 kg (164 lb 0.7 oz)     SpO2: 100% 100% 97% 91%    Intake/Output Summary (Last 24 hours) at 10/08/12 1235 Last data filed at 10/08/12 0600  Gross per 24 hour  Intake      5 ml  Output   1620 ml  Net  -1615 ml   Filed Weights   10/06/12 1330 10/07/12 0509  Weight: 74.39 kg (164 lb) 74.41 kg (164 lb 0.7 oz)    Exam:   General:  improved  Cardiovascular: rrr  Respiratory: no wheezing  Abdomen: +BS, soft  Musculoskeletal: right sided weakness  Mild LE edema   Data Reviewed: Basic Metabolic Panel:  Recent Labs Lab 10/02/12 0505 10/03/12 0400 10/04/12 0610 10/04/12 1309 10/05/12 1000 10/06/12 1155  NA 134* 135 135 135 134* 136  K 4.6 3.9 4.0 3.8 3.9 4.4  CL 99 102 104 103 97 101  CO2 24 24 24 23 27 31   GLUCOSE 110* 127* 91 90 134* 108*  BUN 13 15 10 8 8 7   CREATININE 1.02 0.80 0.65 0.57 0.60 0.59  CALCIUM 8.7 8.6 8.2* 8.2* 8.6 8.3*  MG 2.1 2.3  --   --   --   --    Liver Function Tests:  Recent Labs Lab 10/02/12 0505 10/03/12 0400 10/04/12 1309  AST 19 13 13   ALT 8 9 9   ALKPHOS 78 61 46  BILITOT 0.6 0.4 0.4  PROT 6.3 6.0 5.6*  ALBUMIN 3.0* 2.7* 2.5*   No results found for this basename: LIPASE, AMYLASE,  in the last 168 hours No results found for this basename: AMMONIA,  in the last 168 hours CBC:  Recent Labs Lab 10/02/12 0505 10/03/12 0400 10/04/12 0610 10/05/12 1000 10/06/12 1155 10/07/12 0700 10/08/12 0630  WBC 37.6* 19.9* 10.1 11.7* 8.6 8.1 8.7  NEUTROABS 33.8* 17.7*  --  9.7*  --   --   --   HGB 5.5* 5.2* 6.2* 7.1* 6.4* 6.1* 6.8*  HCT 22.6* 21.8* 24.0* 26.7* 24.4* 23.5* 25.8*  MCV 62.8* 62.6* 64.9* 64.5* 65.6* 66.0*  65.8*  PLT PLATELET CLUMPS NOTED ON SMEAR, COUNT APPEARS ADEQUATE 222 181 231 221 196 222   Cardiac Enzymes:  Recent Labs Lab 10/04/12 1323  TROPONINI <0.30   BNP (last 3 results)  Recent Labs  10/05/12 0455  PROBNP 2326.0*   CBG:  Recent Labs Lab 10/03/12 0811 10/03/12 1129 10/03/12 1554 10/04/12 0009 10/04/12 0804  GLUCAP 89 141* 93 101* 86    Recent Results (from the past 240 hour(s))  URINE CULTURE     Status: None   Collection Time    10/01/12  9:04 PM      Result Value Range Status   Specimen Description URINE, CLEAN CATCH   Final   Special Requests NONE   Final   Culture  Setup Time 10/02/2012 02:48   Final   Colony Count NO GROWTH   Final   Culture NO GROWTH   Final   Report Status 10/03/2012 FINAL   Final  SURGICAL PCR SCREEN     Status: None   Collection Time    10/01/12 11:28 PM      Result Value Range Status   MRSA, PCR NEGATIVE  NEGATIVE Final   Staphylococcus aureus NEGATIVE  NEGATIVE Final   Comment:            The Xpert SA Assay (FDA     approved for NASAL specimens     in patients over 43 years of age),     is one component of     a comprehensive surveillance     program.  Test performance has     been validated by [employer] for patients greater     than or equal to 60 year old.     It is not intended     to diagnose infection nor to     guide or monitor treatment.  CULTURE, ROUTINE-ABSCESS     Status: None   Collection Time    10/02/12  1:55 PM      Result Value Range Status   Specimen Description ABSCESS GALL BLADDER   Final   Special Requests NONE   Final   Gram Stain     Final   Value: NO WBC SEEN     NO SQUAMOUS EPITHELIAL CELLS SEEN     MODERATE GRAM POSITIVE RODS     MODERATE GRAM POSITIVE COCCI     IN PAIRS FEW GRAM NEGATIVE RODS   Culture     Final   Value: MULTIPLE ORGANISMS PRESENT, NONE PREDOMINANT     Note: NO STAPHYLOCOCCUS AUREUS ISOLATED NO GROUP A STREP (S.PYOGENES) ISOLATED   Report Status 10/05/2012  FINAL   Final  ANAEROBIC CULTURE     Status: None   Collection Time    10/02/12  1:55 PM      Result Value Range Status   Specimen Description ABSCESS GALL BLADDER   Final   Special Requests NONE   Final   Gram Stain     Final   Value: NO WBC SEEN     NO SQUAMOUS EPITHELIAL CELLS SEEN     MODERATE GRAM POSITIVE RODS     MODERATE GRAM POSITIVE COCCI     IN PAIRS FEW GRAM NEGATIVE RODS   Culture NO ANAEROBES ISOLATED   Final   Report Status 10/07/2012 FINAL   Final  MRSA PCR SCREENING     Status: None   Collection Time    10/02/12  2:09 PM      Result Value Range Status   MRSA by PCR NEGATIVE  NEGATIVE Final   Comment:            The GeneXpert MRSA Assay (FDA     approved for NASAL specimens     only), is one component of a     comprehensive MRSA colonization     surveillance program. It is not     intended to diagnose MRSA     infection nor to guide or     monitor treatment for     MRSA infections.  CULTURE, BLOOD (ROUTINE X 2)     Status: None   Collection Time    10/02/12  2:30 PM      Result Value Range Status   Specimen Description BLOOD RIGHT ARM   Final   Special Requests BOTTLES DRAWN AEROBIC AND ANAEROBIC 10CC   Final   Culture  Setup Time 10/02/2012 21:33   Final   Culture NO GROWTH 5 DAYS   Final   Report Status 10/08/2012 FINAL   Final  CULTURE, BLOOD (ROUTINE X 2)     Status: None   Collection Time    10/02/12  2:40 PM      Result Value Range Status   Specimen Description BLOOD RIGHT ARM   Final   Special Requests BOTTLES DRAWN AEROBIC AND ANAEROBIC 10CC   Final   Culture  Setup Time 10/02/2012 21:33   Final   Culture NO GROWTH 5 DAYS   Final   Report Status 10/08/2012 FINAL   Final     Studies: No results found.  Scheduled Meds: . antiseptic oral rinse  15 mL Mouth Rinse q12n4p  . aspirin  325 mg Oral Daily  . bisoprolol  10 mg Oral Daily  . ciprofloxacin  500 mg Oral BID  . clopidogrel  75 mg Oral Daily  . docusate sodium  100 mg Oral Daily  .  enoxaparin (LOVENOX) injection  40 mg Subcutaneous Q24H  . estrogens (conjugated)  0.625 mg Oral BID  . furosemide  40 mg Oral Daily  . mineral oil   Oral Q2000  . OxyCODONE  20 mg Oral Custom  . pantoprazole  40 mg Oral Q1200  . polyethylene glycol  17 g Oral TID  . potassium chloride  40 mEq Oral BID  . senna  1 tablet Oral TID  . thyroid  150 mg Oral QAC breakfast   Continuous Infusions:    Principal Problem:   Benign hypertensive heart disease without heart failure Active Problems:   Hypothyroidism   Polycythemia vera(238.4)   Fibromyalgia   CAD (coronary artery disease)   Cholelithiases   Unspecified constipation  Septic shock   CVA (cerebral infarction)   Acute cholecystitis    Time spent: 25    Marlin Canary  [facility] Pager [phone]. If 7PM-7AM, please contact night-coverage at [email or web address], password [email or web address] 10/08/2012, 12:35 PM  LOS: 8 days

## 2012-10-08 NOTE — Progress Notes (Signed)
Benign hypertensive heart disease without heart failure  Assessment: Cholecystitis improving s/p perc drain  Plan: We will follow while in hospital. At some point should have cholangiogram thru the tube to see if it can be clamped. Should be done after she is recovered from the CEA scheduled for Tuesday   Subjective: No GI c/o; anticipates carotid surgery Tuesday  Objective: Vital signs in last 24 hours: Temp:  [98.1 F (36.7 C)-99 F (37.2 C)] 98.4 F (36.9 C) (07/13 0540) Pulse Rate:  [63-75] 72 (07/13 0540) Resp:  [15-18] 18 (07/13 0540) BP: (103-129)/(47-55) 108/52 mmHg (07/13 0540) SpO2:  [91 %-100 %] 91 % (07/13 0540) Last BM Date: 10/07/12  Intake/Output from previous day: 07/12 0701 - 07/13 0700 In: 5  Out: 2520 [Urine:2050; Drains:170; Stool:300]  General appearance: alert, cooperative and no distress GI: soft, non-tender; bowel sounds normal; no masses,  no organomegaly and Perc drain clear bile  Lab Results:  Results for orders placed during the hospital encounter of 09/30/12 (from the past 24 hour(s))  CBC     Status: Abnormal   Collection Time    10/08/12  6:30 AM      Result Value Range   WBC 8.7  4.0 - 10.5 K/uL   RBC 3.92  3.87 - 5.11 MIL/uL   Hemoglobin 6.8 (*) 12.0 - 15.0 g/dL   HCT 16.1 (*) 09.6 - 04.5 %   MCV 65.8 (*) 78.0 - 100.0 fL   MCH 17.3 (*) 26.0 - 34.0 pg   MCHC 26.4 (*) 30.0 - 36.0 g/dL   RDW 40.9 (*) 81.1 - 91.4 %   Platelets 222  150 - 400 K/uL     Studies/Results Radiology     MEDS, Scheduled . antiseptic oral rinse  15 mL Mouth Rinse q12n4p  . aspirin  325 mg Oral Daily  . bisoprolol  10 mg Oral Daily  . ciprofloxacin  500 mg Oral BID  . clopidogrel  75 mg Oral Daily  . docusate sodium  100 mg Oral Daily  . enoxaparin (LOVENOX) injection  40 mg Subcutaneous Q24H  . estrogens (conjugated)  0.625 mg Oral BID  . furosemide  40 mg Oral Daily  . lisinopril  5 mg Oral Daily  . mineral oil   Oral Q2000  . OxyCODONE  20 mg  Oral Custom  . pantoprazole  40 mg Oral Q1200  . polyethylene glycol  17 g Oral TID  . potassium chloride  40 mEq Oral BID  . senna  1 tablet Oral TID  . thyroid  150 mg Oral QAC breakfast       LOS: 8 days    Currie Paris, MD, [facility], [place] [phone]   10/08/2012 9:30 AM

## 2012-10-09 ENCOUNTER — Telehealth: Payer: Self-pay | Admitting: Hematology & Oncology

## 2012-10-09 ENCOUNTER — Other Ambulatory Visit: Payer: Self-pay | Admitting: *Deleted

## 2012-10-09 LAB — BASIC METABOLIC PANEL
BUN: 7 mg/dL (ref 6–23)
CO2: 31 mEq/L (ref 19–32)
Chloride: 98 mEq/L (ref 96–112)
Glucose, Bld: 92 mg/dL (ref 70–99)
Potassium: 4.6 mEq/L (ref 3.5–5.1)
Sodium: 134 mEq/L — ABNORMAL LOW (ref 135–145)

## 2012-10-09 LAB — CBC
HCT: 24.9 % — ABNORMAL LOW (ref 36.0–46.0)
Hemoglobin: 6.5 g/dL — CL (ref 12.0–15.0)
RBC: 3.79 MIL/uL — ABNORMAL LOW (ref 3.87–5.11)
WBC: 6.4 10*3/uL (ref 4.0–10.5)

## 2012-10-09 MED ORDER — GLYCERIN (LAXATIVE) 2.1 G RE SUPP
1.0000 | Freq: Every day | RECTAL | Status: DC | PRN
  Filled 2012-10-09: qty 1

## 2012-10-09 MED ORDER — FUROSEMIDE 10 MG/ML IJ SOLN
20.0000 mg | Freq: Once | INTRAMUSCULAR | Status: AC
  Administered 2012-10-09: 20 mg via INTRAVENOUS
  Filled 2012-10-09 (×2): qty 2

## 2012-10-09 MED ORDER — VANCOMYCIN HCL 10 G IV SOLR
1250.0000 mg | INTRAVENOUS | Status: AC
  Administered 2012-10-10: 1250 mg via INTRAVENOUS
  Filled 2012-10-09: qty 1250

## 2012-10-09 MED ORDER — FLEET ENEMA 7-19 GM/118ML RE ENEM: 1.0000 | ENEMA | Freq: Every day | RECTAL | Status: DC | PRN

## 2012-10-09 NOTE — Telephone Encounter (Signed)
Pt aware of 7-30 and 8-13 appointments

## 2012-10-09 NOTE — Telephone Encounter (Signed)
Husband called cx 7-17 MD pt is in the hospital he wanted to make lab appointments. I told him per Alvino Chapel they would call him back. He refused to hang up and would wait as long as needed to talk to a Nurse. He was transferred to a Nurse.

## 2012-10-09 NOTE — Progress Notes (Signed)
ANTIBIOTIC CONSULT NOTE - INITIAL  Pharmacy Consult for Vancomycin x 1 Indication: Surgical Prophylaxis  Allergies  Allergen Reactions  . Calcium Channel Blockers   . Codeine   . Penicillins   . Synthroid (Levothyroxine Sodium)    Patient Measurements: Height: 5\' 3"  (160 cm) Weight: 164 lb 0.7 oz (74.41 kg) IBW/kg (Calculated) : 52.4 Adjusted Body Weight: 61.2 kg Percent over ibw: 41.98%  Vital Signs: Temp: 98 F (36.7 C) (07/14 0458) Temp src: Oral (07/14 0458) BP: 106/67 mmHg (07/14 0458)  Labs:  Recent Labs  10/06/12 1155 10/07/12 0700 10/08/12 0630 10/09/12 0500  WBC 8.6 8.1 8.7 6.4  HGB 6.4* 6.1* 6.8* 6.5*  PLT 221 196 222 227  CREATININE 0.59  --   --  0.71   Estimated Creatinine Clearance: 64.1 ml/min (by C-G formula based on Cr of 0.71).  Medical History: Past Medical History  Diagnosis Date  . 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 Fibromyalgia   . Hypothyroidism   . Essential hypertension   . Postmenopausal state     on hormone replacement therapy  . Aortic valve sclerosis     by echocardiogram 12/04/2009  . PAC (premature atrial contraction)     history of  . Coronary artery disease   . Hyperlipidemia   . History of echocardiogram 12/04/2009    showed mild ventricle hypertrophy and normal EF at 55-60% -- mild aortic valve sclerosis  -- trace mitral regurgitation -- slightly pulmonary artery pressure at 47  --  [name], M.D.   . Fatigue    Medications:  Anti-infectives   Start     Dose/Rate Route Frequency Ordered Stop   10/10/12 0600  vancomycin (VANCOCIN) 1,250 mg in sodium chloride 0.9 % 250 mL IVPB     1,250 mg 166.7 mL/hr over 90 Minutes Intravenous On call to O.R. 10/09/12 1019 10/11/12 0559   10/04/12 1300  ciprofloxacin (CIPRO) tablet 500 mg     500 mg Oral 2 times daily 10/04/12 1121 10/09/12 2359   10/01/12 0815  metroNIDAZOLE (FLAGYL) IVPB 500 mg  Status:  Discontinued     500 mg 100 mL/hr over 60  Minutes Intravenous Every 8 hours 10/01/12 0810 10/03/12 0816   09/30/12 1200  ciprofloxacin (CIPRO) IVPB 400 mg  Status:  Discontinued     400 mg 200 mL/hr over 60 Minutes Intravenous Every 12 hours 09/30/12 1057 10/04/12 1121     Assessment: 67 yo female admitted with c/o abdominal pain with nausea and vomiting.  She had leukocytosis and was found to have cholecystitis per [place].  HIDA scan reveals obstruction and she is s/p drain placement with plans to remain in place 6-8 weeks.  We have been asked to provide one dose of Vancomycin for surgical prophylaxis.  Allergy reviewed and she has listed an allergy to Penicillins but not Vancomycin.  She has normal renal function and her estimated crcl is 64 ml/min.  Plan:  1.  Will send Vancomycin 1250 mg x 1  2.  Monitor for continued antibiotic needs.  Nadara Mustard, PharmD., MS Clinical Pharmacist Pager:  [phone] Thank you for allowing pharmacy to be part of this patients care team. 10/09/2012,10:19 AM

## 2012-10-09 NOTE — Progress Notes (Signed)
OT Cancellation Note  Patient Details Name: [name] MRN: [id number] DOB: November 10, 1945   Cancelled Treatment:    Reason Eval/Treat Not Completed: Fatigue/lethargy limiting ability to participate - pt reports extreme fatigue this morning, and requests therapist return late in pm.  Her pastor is coming at 2:00 and requests to wait until after that.  I will re-attempt this pm as able, otherwise, will see tomorrow am  Boykin Reaper [phone] 10/09/2012, 11:24 AM

## 2012-10-09 NOTE — Progress Notes (Signed)
[facility] PROGRESS NOTE  [name] ZOX:[id number] DOB: [birth year] DOA: 09/30/2012 PCP: [name], MD  Assessment/Plan: Weakness- +MRI for CVA - neurology consulted -appreciate their assistance (FLp- LDL 23, HDL 11, HgbA1C) - Pt/OT/SLP -Vascular surgery -- for CEA on Tuesday  - difficult situation with a complicated patient  Cholelithiasis-acute cholecystitis, patient had a percutaneous drain placed on 7/7, WBC has trended down, status post HIDA scan with cystic duct obstruction, surgery following,  Drain to stay in for 6-8 weeks then re-evaluation for cholecystecomy  abx per surgery- as of 7/11- should remain on for 10 days (d/c after tomm dose)  Chronic pain -trying to schedule as patient takes at home  Shortness of breath  Wean off O2 -resume lasix- give dose x 1 IV this PM -echo ok   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 Should not get above 7 per hematology (Ennever, transfused one unit by Dr. Twanna Hy on 7/8, hemoglobin up to 6.2 ) Suspect with lab draws will slowly decrease to desired levels -Ennover ordered 500cc plebotomy- Hgb 6.1-6.8 do not think patient will need  Hypertension, discontinue HCTZ and d/c lisinopril  CAD Pt's Cardiologist note fm 09/28/2010 She does not have a history of known ischemic heart disease. She had an echocardiogram in 12/04/09 which showed normal left ventricular systolic function with an ejection fraction of 55-60% and normal diastolic function and mild aortic sclerosis.    Hypothyroidism; per patient and husband unable to take Synthroid. On Armour Thyroid 120 mg daily (patient has home meds) TSH suppressed; free t4 ok and will need to recheck as an outaptient  Constipation- added bowel regimen with enema- did not need enema     Code Status: full Family Communication: patient/husband- answered all the questions Disposition Plan: home health PT/OT/SLP/RN?      HPI/Subjective: No BM today- feeling  constipated SOB improved   Objective: Filed Vitals:   10/07/12 2111 10/08/12 0540 10/08/12 2114 10/09/12 0458  BP: 129/55 108/52 122/59 106/67  Pulse: 63 72 60   Temp: 99 F (37.2 C) 98.4 F (36.9 C) 97.9 F (36.6 C) 98 F (36.7 C)  TempSrc: Oral Oral Oral Oral  Resp: 16 18 18 18   Height:      Weight:      SpO2: 97% 91% 100% 100%    Intake/Output Summary (Last 24 hours) at 10/09/12 0939 Last data filed at 10/09/12 0827  Gross per 24 hour  Intake      0 ml  Output   2830 ml  Net  -2830 ml   Filed Weights   10/06/12 1330 10/07/12 0509  Weight: 74.39 kg (164 lb) 74.41 kg (164 lb 0.7 oz)    Exam:   General:  improved  Cardiovascular: rrr  Respiratory: no wheezing  Abdomen: +BS, soft  Musculoskeletal: right sided weakness  Mild LE edema   Data Reviewed: Basic Metabolic Panel:  Recent Labs Lab 10/03/12 0400 10/04/12 0610 10/04/12 1309 10/05/12 1000 10/06/12 1155 10/09/12 0500  NA 135 135 135 134* 136 134*  K 3.9 4.0 3.8 3.9 4.4 4.6  CL 102 104 103 97 101 98  CO2 24 24 23 27 31 31   GLUCOSE 127* 91 90 134* 108* 92  BUN 15 10 8 8 7 7   CREATININE 0.80 0.65 0.57 0.60 0.59 0.71  CALCIUM 8.6 8.2* 8.2* 8.6 8.3* 8.9  MG 2.3  --   --   --   --   --    Liver Function Tests:  Recent Labs  Lab 10/03/12 0400 10/04/12 1309  AST 13 13  ALT 9 9  ALKPHOS 61 46  BILITOT 0.4 0.4  PROT 6.0 5.6*  ALBUMIN 2.7* 2.5*   No results found for this basename: LIPASE, AMYLASE,  in the last 168 hours No results found for this basename: AMMONIA,  in the last 168 hours CBC:  Recent Labs Lab 10/03/12 0400  10/05/12 1000 10/06/12 1155 10/07/12 0700 10/08/12 0630 10/09/12 0500  WBC 19.9*  < > 11.7* 8.6 8.1 8.7 6.4  NEUTROABS 17.7*  --  9.7*  --   --   --   --   HGB 5.2*  < > 7.1* 6.4* 6.1* 6.8* 6.5*  HCT 21.8*  < > 26.7* 24.4* 23.5* 25.8* 24.9*  MCV 62.6*  < > 64.5* 65.6* 66.0* 65.8* 65.7*  PLT 222  < > 231 221 196 222 227  < > = values in this interval not  displayed. Cardiac Enzymes:  Recent Labs Lab 10/04/12 1323  TROPONINI <0.30   BNP (last 3 results)  Recent Labs  10/05/12 0455  PROBNP 2326.0*   CBG:  Recent Labs Lab 10/03/12 0811 10/03/12 1129 10/03/12 1554 10/04/12 0009 10/04/12 0804  GLUCAP 89 141* 93 101* 86    Recent Results (from the past 240 hour(s))  URINE CULTURE     Status: None   Collection Time    10/01/12  9:04 PM      Result Value Range Status   Specimen Description URINE, CLEAN CATCH   Final   Special Requests NONE   Final   Culture  Setup Time 10/02/2012 02:48   Final   Colony Count NO GROWTH   Final   Culture NO GROWTH   Final   Report Status 10/03/2012 FINAL   Final  SURGICAL PCR SCREEN     Status: None   Collection Time    10/01/12 11:28 PM      Result Value Range Status   MRSA, PCR NEGATIVE  NEGATIVE Final   Staphylococcus aureus NEGATIVE  NEGATIVE Final   Comment:            The Xpert SA Assay (FDA     approved for NASAL specimens     in patients over 80 years of age),     is one component of     a comprehensive surveillance     program.  Test performance has     been validated by [employer] for patients greater     than or equal to 57 year old.     It is not intended     to diagnose infection nor to     guide or monitor treatment.  CULTURE, ROUTINE-ABSCESS     Status: None   Collection Time    10/02/12  1:55 PM      Result Value Range Status   Specimen Description ABSCESS GALL BLADDER   Final   Special Requests NONE   Final   Gram Stain     Final   Value: NO WBC SEEN     NO SQUAMOUS EPITHELIAL CELLS SEEN     MODERATE GRAM POSITIVE RODS     MODERATE GRAM POSITIVE COCCI     IN PAIRS FEW GRAM NEGATIVE RODS   Culture     Final   Value: MULTIPLE ORGANISMS PRESENT, NONE PREDOMINANT     Note: NO STAPHYLOCOCCUS AUREUS ISOLATED NO GROUP A STREP (S.PYOGENES) ISOLATED   Report Status 10/05/2012 FINAL  Final  ANAEROBIC CULTURE     Status: None   Collection Time    10/02/12   1:55 PM      Result Value Range Status   Specimen Description ABSCESS GALL BLADDER   Final   Special Requests NONE   Final   Gram Stain     Final   Value: NO WBC SEEN     NO SQUAMOUS EPITHELIAL CELLS SEEN     MODERATE GRAM POSITIVE RODS     MODERATE GRAM POSITIVE COCCI     IN PAIRS FEW GRAM NEGATIVE RODS   Culture NO ANAEROBES ISOLATED   Final   Report Status 10/07/2012 FINAL   Final  MRSA PCR SCREENING     Status: None   Collection Time    10/02/12  2:09 PM      Result Value Range Status   MRSA by PCR NEGATIVE  NEGATIVE Final   Comment:            The GeneXpert MRSA Assay (FDA     approved for NASAL specimens     only), is one component of a     comprehensive MRSA colonization     surveillance program. It is not     intended to diagnose MRSA     infection nor to guide or     monitor treatment for     MRSA infections.  CULTURE, BLOOD (ROUTINE X 2)     Status: None   Collection Time    10/02/12  2:30 PM      Result Value Range Status   Specimen Description BLOOD RIGHT ARM   Final   Special Requests BOTTLES DRAWN AEROBIC AND ANAEROBIC 10CC   Final   Culture  Setup Time 10/02/2012 21:33   Final   Culture NO GROWTH 5 DAYS   Final   Report Status 10/08/2012 FINAL   Final  CULTURE, BLOOD (ROUTINE X 2)     Status: None   Collection Time    10/02/12  2:40 PM      Result Value Range Status   Specimen Description BLOOD RIGHT ARM   Final   Special Requests BOTTLES DRAWN AEROBIC AND ANAEROBIC 10CC   Final   Culture  Setup Time 10/02/2012 21:33   Final   Culture NO GROWTH 5 DAYS   Final   Report Status 10/08/2012 FINAL   Final     Studies: No results found.  Scheduled Meds: . antiseptic oral rinse  15 mL Mouth Rinse q12n4p  . aspirin  325 mg Oral Daily  . bisoprolol  10 mg Oral Daily  . ciprofloxacin  500 mg Oral BID  . clopidogrel  75 mg Oral Daily  . docusate sodium  100 mg Oral Daily  . enoxaparin (LOVENOX) injection  40 mg Subcutaneous Q24H  . estrogens (conjugated)   0.625 mg Oral BID  . furosemide  40 mg Oral Daily  . mineral oil   Oral Q2000  . OxyCODONE  20 mg Oral Custom  . pantoprazole  40 mg Oral Q1200  . polyethylene glycol  17 g Oral TID  . potassium chloride  40 mEq Oral BID  . senna  1 tablet Oral TID  . thyroid  150 mg Oral QAC breakfast   Continuous Infusions:    Principal Problem:   Benign hypertensive heart disease without heart failure Active Problems:   Hypothyroidism   Polycythemia vera(238.4)   Fibromyalgia   CAD (coronary artery disease)   Cholelithiases   Unspecified  constipation   Septic shock   CVA (cerebral infarction)   Acute cholecystitis    Time spent: 25    Marlin Canary  [facility] Pager [phone]. If 7PM-7AM, please contact night-coverage at [email or web address], password [facility] 10/09/2012, 9:39 AM  LOS: 9 days

## 2012-10-09 NOTE — Progress Notes (Signed)
Pt's husband called requesting to cancel MD appt in July but wants to make sure she stays on her q 2 week lab appt checks because "we have based our entire year on her getting labs on the 2nd and 4th Wednesdays of the month". Asked about when he wanted to have her MD appt r/s'd and he said anytime of the day or week is fine for that. She is having an Left Endarterectomy tomorrow.

## 2012-10-09 NOTE — Progress Notes (Signed)
Right hand still clumsy but much improved Questions answered with pt and husband regarding left CEA NPO p midnight Consent  Left CEA tomorrow  Charles Fields, MD [facility] Office: [phone] Pager: [phone]  

## 2012-10-09 NOTE — Telephone Encounter (Signed)
Amy RN said she would send in basket note to call husband back this afternoon to schedule appointments

## 2012-10-09 NOTE — Progress Notes (Signed)
Physical Therapy Treatment Patient Details Name: [name] MRN: [id number] DOB: Feb 12, 1946 Today's Date: 10/09/2012 Time: 4098-1191 PT Time Calculation (min): 19 min  PT Assessment / Plan / Recommendation  PT Comments   Much improved gait stability , mentation and speech quality.   Follow Up Recommendations  Home health PT;Supervision for mobility/OOB     Does the patient have the potential to tolerate intense rehabilitation     Barriers to Discharge        Equipment Recommendations  None recommended by PT    Recommendations for Other Services    Frequency Min 3X/week   Progress towards PT Goals Progress towards PT goals: Progressing toward goals  Plan Current plan remains appropriate    Precautions / Restrictions Precautions Precautions: Fall   Pertinent Vitals/Pain     Mobility  Bed Mobility Bed Mobility: Supine to Sit;Sitting - Scoot to Edge of Bed;Sit to Supine Supine to Sit: 6: Modified independent (Device/Increase time);With rails;HOB flat Sitting - Scoot to Edge of Bed: 6: Modified independent (Device/Increase time) Details for Bed Mobility Assistance: Much improved bed mobility and safe Transfers Transfers: Sit to Stand;Stand to Sit;Stand Pivot Transfers Sit to Stand: 5: Supervision Stand to Sit: 5: Supervision Stand Pivot Transfers: 5: Supervision Ambulation/Gait Ambulation/Gait Assistance: 5: Supervision Ambulation Distance (Feet): 300 Feet Assistive device: Straight cane;None Ambulation/Gait Assistance Details: reinforced proper use of cane, but pt carried it too often.  Pt generally steady without asssitive device today. Gait Pattern: Step-through pattern Gait velocity: decrease Stairs: Yes Stairs Assistance: 4: Min guard Stair Management Technique: One rail Right;Step to pattern;Forwards Number of Stairs: 3 Wheelchair Mobility Wheelchair Mobility: No Modified Rankin (Stroke Patients Only) Pre-Morbid Rankin Score: No symptoms Modified Rankin:  Slight disability    Exercises     PT Diagnosis:    PT Problem List:   PT Treatment Interventions:     PT Goals (current goals can now be found in the care plan section) Acute Rehab PT Goals PT Goal Formulation: With patient Time For Goal Achievement: 10/13/12 Potential to Achieve Goals: Good  Visit Information  Last PT Received On: 10/09/12 Assistance Needed: +1 History of Present Illness: pt admitted with epigatric pain, N/V.  Pt found to have cholecystitis,  Perc. drain placed.  Complications from polycythemia and Hgb level may have contributed to a new watershed CVA with R side weakness  Upper > lower and slurred speech.    Subjective Data      Cognition  Cognition Arousal/Alertness: Awake/alert Behavior During Therapy: WFL for tasks assessed/performed Overall Cognitive Status: Within Functional Limits for tasks assessed    Balance     End of Session PT - End of Session Activity Tolerance: Patient tolerated treatment well Patient left: with call bell/phone within reach;with family/visitor present;Other (comment) (sitting EOB) Nurse Communication: Mobility status   GP     Rondo Spittler, Eliseo Gum 10/09/2012, 5:57 PM 10/09/2012  [place] Bing, PT [phone]  (pager)

## 2012-10-09 NOTE — Progress Notes (Addendum)
Stroke Team Progress Note  HISTORY [name] is an 67 y.o. female with a history of polycythemia, hypertension and hyperlipidemia who was admitted on 09/30/2012 with acute cholecystitis. She status post drain placement. She experienced sudden onset of weakness involving her right hand as well as change in speech at around noon on 10/04/2012 in the hospital. MRI of her brain performed 10/05/2012 showed left MCA/ACA watershed infarcts. MRA showed markedly diminished flow and possible obstruction of left ICA. Patient has been taking aspirin and Plavix since admission. NIH stroke score was 2. Patient was not a TPA candidate secondary to recent drain placement and lack of recognition of acute stroke.  SUBJECTIVE Husband at the bedside.  OBJECTIVE Most recent Vital Signs: Filed Vitals:   10/07/12 2111 10/08/12 0540 10/08/12 2114 10/09/12 0458  BP: 129/55 108/52 122/59 106/67  Pulse: 63 72 60   Temp: 99 F (37.2 C) 98.4 F (36.9 C) 97.9 F (36.6 C) 98 F (36.7 C)  TempSrc: Oral Oral Oral Oral  Resp: 16 18 18 18   Height:      Weight:      SpO2: 97% 91% 100% 100%   CBG (last 3)  No results found for this basename: GLUCAP,  in the last 72 hours  IV Fluid Intake:      MEDICATIONS  . antiseptic oral rinse  15 mL Mouth Rinse q12n4p  . aspirin  325 mg Oral Daily  . bisoprolol  10 mg Oral Daily  . ciprofloxacin  500 mg Oral BID  . clopidogrel  75 mg Oral Daily  . docusate sodium  100 mg Oral Daily  . enoxaparin (LOVENOX) injection  40 mg Subcutaneous Q24H  . estrogens (conjugated)  0.625 mg Oral BID  . furosemide  40 mg Oral Daily  . mineral oil   Oral Q2000  . OxyCODONE  20 mg Oral Custom  . pantoprazole  40 mg Oral Q1200  . polyethylene glycol  17 g Oral TID  . potassium chloride  40 mEq Oral BID  . senna  1 tablet Oral TID  . thyroid  150 mg Oral QAC breakfast   PRN:  acetaminophen, albuterol-ipratropium, ALPRAZolam, cyclobenzaprine, diphenhydrAMINE, ondansetron, oxyCODONE,  temazepam  Diet:  Fat Restricted thin liquids Activity:  Ambulate in hall DVT Prophylaxis:  Lovenox 40 mg sq daily   CLINICALLY SIGNIFICANT STUDIES Basic Metabolic Panel:  Recent Labs Lab 10/03/12 0400  10/06/12 1155 10/09/12 0500  NA 135  < > 136 134*  K 3.9  < > 4.4 4.6  CL 102  < > 101 98  CO2 24  < > 31 31  GLUCOSE 127*  < > 108* 92  BUN 15  < > 7 7  CREATININE 0.80  < > 0.59 0.71  CALCIUM 8.6  < > 8.3* 8.9  MG 2.3  --   --   --   < > = values in this interval not displayed. Liver Function Tests:   Recent Labs Lab 10/03/12 0400 10/04/12 1309  AST 13 13  ALT 9 9  ALKPHOS 61 46  BILITOT 0.4 0.4  PROT 6.0 5.6*  ALBUMIN 2.7* 2.5*   CBC:  Recent Labs Lab 10/03/12 0400  10/05/12 1000  10/08/12 0630 10/09/12 0500  WBC 19.9*  < > 11.7*  < > 8.7 6.4  NEUTROABS 17.7*  --  9.7*  --   --   --   HGB 5.2*  < > 7.1*  < > 6.8* 6.5*  HCT 21.8*  < >  26.7*  < > 25.8* 24.9*  MCV 62.6*  < > 64.5*  < > 65.8* 65.7*  PLT 222  < > 231  < > 222 227  < > = values in this interval not displayed. Coagulation:   Recent Labs Lab 10/02/12 1021  LABPROT 18.1*  INR 1.54*   Cardiac Enzymes:   Recent Labs Lab 10/04/12 1323  TROPONINI <0.30   Urinalysis:   Recent Labs Lab 10/05/12 1638  COLORURINE YELLOW  LABSPEC 1.013  PHURINE 5.0  GLUCOSEU NEGATIVE  HGBUR NEGATIVE  BILIRUBINUR NEGATIVE  KETONESUR NEGATIVE  PROTEINUR NEGATIVE  UROBILINOGEN 0.2  NITRITE NEGATIVE  LEUKOCYTESUR TRACE*   Lipid Panel    Component Value Date/Time   CHOL 58 10/07/2012 0700   TRIG 121 10/07/2012 0700   HDL 11* 10/07/2012 0700   CHOLHDL 5.3 10/07/2012 0700   VLDL 24 10/07/2012 0700   LDLCALC 23 10/07/2012 0700   HgbA1C  Lab Results  Component Value Date   HGBA1C 5.5 10/07/2012    Urine Drug Screen:   No results found for this basename: labopia,  cocainscrnur,  labbenz,  amphetmu,  thcu,  labbarb    Alcohol Level: No results found for this basename: ETH,  in the last 168  hours   CT of the brain    MRI of the brain  10/05/2012    1. Scattered acute / subacute non hemorrhagic infarcts within a watershed territory between the left ACA and MCA distributions. 2.  Occluded or extremely slow flow within the left internal carotid artery at the skull base. 3.  Stable atrophy and minimal white matter disease otherwise. 4.  Decreased T1 marrow signal within the upper cervical spine and calvarium.  This is compatible with the patient's known anemia.   MRA of the brain  10/05/2012    1.  Markedly diminished signal within the left internal carotid artery and branch vessels.  This corresponds to the area of infarction and raises concern for mil proximal stenosis. 2.  There may be a high-grade stenosis within the cavernous segment. 3.  Prominent left PCA branch vessels.  This is likely related to attempts at collateral flow.    2D Echocardiogram  EF 65-70% with no source of embolus.   Carotid Doppler   40-59% proximal internal carotid artery stenosis with turbulent flow.. Left - There is a 60% to 79% highest end of scale ICA stenosis in the bulb with turbulent flow throughout the ICA. Bilateral - Vertebral artery flow is antegrade.  CXR  10/04/2012    Slightly better aeration.  Stable cardiomegaly.  No definite pulmonary vascular congestion.   EKG  sinus bradycardia.   Therapy Recommendations home health PT  Physical Exam General: The patient is alert and cooperative at the time of the examination. Skin: No significant peripheral edema is noted.  Neurologic Exam Cranial nerves: Facial symmetry is present. Dysarthria is noted, speech is slow, deliberate, some word finding issues. Extraocular movements are full. Visual fields are full. Motor: The patient has good strength in all 4 extremities, with exception of slight weakness of the right upper extremity. Coordination: The patient has good finger-nose-finger and heel-to-shin bilaterally. Gait and station: The gait was not  tested. The patient has mild right upper extremity drift. Reflexes: Deep tendon reflexes are symmetric.   ASSESSMENT [name] is a 67 y.o. female who developed right arm weakness and difficulty with speech while hospitalized for a gallbladder attack. 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 Infarct felt  to be thrombotic secondary to likely carotid stenosis - doppler is pending. While polycythemia can cause stroke, given the distribution of her infarct, it is most likely due to hypoperfusion in the setting of carotid stenosis; it may have been worsened by hypotension. Carotid dopplers will confirm or refute our suspicions.  On aspirin 325 mg orally every day and clopidogrel 75 mg orally every day prior to admission. Now on aspirin 325 mg orally every day for secondary stroke prevention. Patient with resultant right hemiparesis and expressive aphasia - which are improving.    Acute cholecystitis  Polycythemia vera Hypertension CAD  Hyperlipidemia, LDL 60, on no statin PTA, now on no statin, at goal LDL < 100   Hospital day # 9  TREATMENT/PLAN  Continue aspirin 325 mg orally every day and clopidogrel 75 mg orally every day for secondary stroke prevention as prior to admission. Blood letting for polycythemia not likely to help, as cause of stroke is likely hemodynamic from proximal carotid stenosis which may need revascularization.  Vascular surgery Plan for CEA Tuesday.  Stroke team will follow up post op  Of note, patient has been seen by a [facility] neurologist within the past year - Loyal Gambler - [phone]  Annie Main, MSN, RN, ANVP-BC, ANP-BC, GNP-BC [facility] Pager: [phone] 10/09/2012 8:36 AM  I have personally obtained a history, examined the patient, evaluated imaging results, and formulated the assessment and plan of care. I agree with the above.   Delia Heady, MD

## 2012-10-09 NOTE — Progress Notes (Signed)
Patient ID: [name], female   DOB: 03-24-1946, 67 y.o.   MRN: [id number]    Subjective: Pt without complaints.    Objective: Vital signs in last 24 hours: Temp:  [97.9 F (36.6 C)-98 F (36.7 C)] 98 F (36.7 C) (07/14 0458) Pulse Rate:  [60] 60 (07/13 2114) Resp:  [18] 18 (07/14 0458) BP: (106-122)/(59-67) 106/67 mmHg (07/14 0458) SpO2:  [100 %] 100 % (07/14 0458) Last BM Date: 10/07/12  Intake/Output from previous day: 07/13 0701 - 07/14 0700 In: -  Out: 2130 [Urine:2000; Drains:130] Intake/Output this shift: Total I/O In: -  Out: 700 [Urine:700]  PE: Abd: soft, NT, ND, chole drain with bilious output.  Lab Results:   Recent Labs  10/08/12 0630 10/09/12 0500  WBC 8.7 6.4  HGB 6.8* 6.5*  HCT 25.8* 24.9*  PLT 222 227   BMET  Recent Labs  10/06/12 1155 10/09/12 0500  NA 136 134*  K 4.4 4.6  CL 101 98  CO2 31 31  GLUCOSE 108* 92  BUN 7 7  CREATININE 0.59 0.71  CALCIUM 8.3* 8.9   PT/INR No results found for this basename: LABPROT, INR,  in the last 72 hours CMP     Component Value Date/Time   NA 134* 10/09/2012 0500   K 4.6 10/09/2012 0500   CL 98 10/09/2012 0500   CO2 31 10/09/2012 0500   GLUCOSE 92 10/09/2012 0500   BUN 7 10/09/2012 0500   CREATININE 0.71 10/09/2012 0500   CALCIUM 8.9 10/09/2012 0500   PROT 5.6* 10/04/2012 1309   ALBUMIN 2.5* 10/04/2012 1309   AST 13 10/04/2012 1309   ALT 9 10/04/2012 1309   ALKPHOS 46 10/04/2012 1309   BILITOT 0.4 10/04/2012 1309   GFRNONAA 86* 10/09/2012 0500   GFRAA >90 10/09/2012 0500   Lipase     Component Value Date/Time   LIPASE 23 09/30/2012 0039       Studies/Results: No results found.  Anti-infectives: Anti-infectives   Start     Dose/Rate Route Frequency Ordered Stop   10/04/12 1300  ciprofloxacin (CIPRO) tablet 500 mg     500 mg Oral 2 times daily 10/04/12 1121 10/09/12 2359   10/01/12 0815  metroNIDAZOLE (FLAGYL) IVPB 500 mg  Status:  Discontinued     500 mg 100 mL/hr over 60 Minutes Intravenous  Every 8 hours 10/01/12 0810 10/03/12 0816   09/30/12 1200  ciprofloxacin (CIPRO) IVPB 400 mg  Status:  Discontinued     400 mg 200 mL/hr over 60 Minutes Intravenous Every 12 hours 09/30/12 1057 10/04/12 1121       Assessment/Plan  1. Acute cholecystitis, s/p perc drain 2. CVA 3. PCV  Plan: 1. Will stop Cipro today as today is Day 10. 2. Cont current drain care.   LOS: 9 days    Katheryne Gorr E 10/09/2012, 9:53 AM Pager: [phone]

## 2012-10-10 ENCOUNTER — Telehealth: Payer: Self-pay | Admitting: Vascular Surgery

## 2012-10-10 ENCOUNTER — Encounter (HOSPITAL_COMMUNITY): Payer: Self-pay | Admitting: *Deleted

## 2012-10-10 ENCOUNTER — Encounter (HOSPITAL_COMMUNITY): Admission: EM | Disposition: A | Discharge status: Home / Self Care | Payer: Self-pay | Source: Home / Self Care

## 2012-10-10 ENCOUNTER — Inpatient Hospital Stay (HOSPITAL_COMMUNITY): Payer: Medicare Other | Admitting: *Deleted

## 2012-10-10 HISTORY — PX: ENDARTERECTOMY: SHX5162

## 2012-10-10 HISTORY — PX: CAROTID ENDARTERECTOMY: SUR193

## 2012-10-10 LAB — CBC
HCT: 25.9 % — ABNORMAL LOW (ref 36.0–46.0)
Hemoglobin: 6.8 g/dL — CL (ref 12.0–15.0)
MCH: 17.2 pg — ABNORMAL LOW (ref 26.0–34.0)
MCHC: 26.3 g/dL — ABNORMAL LOW (ref 30.0–36.0)
RBC: 3.95 MIL/uL (ref 3.87–5.11)

## 2012-10-10 LAB — BASIC METABOLIC PANEL
BUN: 6 mg/dL (ref 6–23)
CO2: 32 mEq/L (ref 19–32)
Calcium: 8.6 mg/dL (ref 8.4–10.5)
GFR calc non Af Amer: 87 mL/min — ABNORMAL LOW (ref >90)
Glucose, Bld: 95 mg/dL (ref 70–99)
Sodium: 136 mEq/L (ref 135–145)

## 2012-10-10 SURGERY — ENDARTERECTOMY, CAROTID
Anesthesia: General | Site: Neck | Laterality: Left | Wound class: Clean

## 2012-10-10 MED ORDER — OXYCODONE HCL 5 MG PO TABS: 5.0000 mg | ORAL_TABLET | Freq: Once | ORAL | Status: DC | PRN

## 2012-10-10 MED ORDER — HYDROMORPHONE HCL PF 1 MG/ML IJ SOLN
INTRAMUSCULAR | Status: AC
  Filled 2012-10-10: qty 1

## 2012-10-10 MED ORDER — THROMBIN 20000 UNITS EX SOLR
CUTANEOUS | Status: AC
  Filled 2012-10-10: qty 20000

## 2012-10-10 MED ORDER — LIDOCAINE HCL (PF) 1 % IJ SOLN
INTRAMUSCULAR | Status: AC
  Filled 2012-10-10: qty 30

## 2012-10-10 MED ORDER — DOPAMINE-DEXTROSE 3.2-5 MG/ML-% IV SOLN: 3.0000 ug/kg/min | INTRAVENOUS | Status: DC

## 2012-10-10 MED ORDER — HEPARIN SODIUM (PORCINE) 5000 UNIT/ML IJ SOLN
INTRAMUSCULAR | Status: DC | PRN
  Administered 2012-10-10: 09:00:00

## 2012-10-10 MED ORDER — 0.9 % SODIUM CHLORIDE (POUR BTL) OPTIME
TOPICAL | Status: DC | PRN
  Administered 2012-10-10: 2000 mL

## 2012-10-10 MED ORDER — NEOSTIGMINE METHYLSULFATE 1 MG/ML IJ SOLN
INTRAMUSCULAR | Status: DC | PRN
  Administered 2012-10-10 (×2): 2 mg via INTRAVENOUS

## 2012-10-10 MED ORDER — ONDANSETRON HCL 4 MG/2ML IJ SOLN: 4.0000 mg | Freq: Four times a day (QID) | INTRAMUSCULAR | Status: DC | PRN

## 2012-10-10 MED ORDER — MORPHINE SULFATE 2 MG/ML IJ SOLN
2.0000 mg | INTRAMUSCULAR | Status: DC | PRN
  Administered 2012-10-10 (×4): 2 mg via INTRAVENOUS
  Filled 2012-10-10 (×3): qty 1

## 2012-10-10 MED ORDER — PROTAMINE SULFATE 10 MG/ML IV SOLN
INTRAVENOUS | Status: DC | PRN
  Administered 2012-10-10: 10 mg via INTRAVENOUS
  Administered 2012-10-10 (×3): 25 mg via INTRAVENOUS
  Administered 2012-10-10: 15 mg via INTRAVENOUS

## 2012-10-10 MED ORDER — SODIUM CHLORIDE 0.9 % IV SOLN
10.0000 mg | INTRAVENOUS | Status: DC | PRN
  Administered 2012-10-10: 25 ug/min via INTRAVENOUS

## 2012-10-10 MED ORDER — ROCURONIUM BROMIDE 100 MG/10ML IV SOLN
INTRAVENOUS | Status: DC | PRN
  Administered 2012-10-10: 50 mg via INTRAVENOUS

## 2012-10-10 MED ORDER — FENTANYL CITRATE 0.05 MG/ML IJ SOLN
INTRAMUSCULAR | Status: AC
  Filled 2012-10-10: qty 2

## 2012-10-10 MED ORDER — SCOPOLAMINE 1 MG/3DAYS TD PT72
MEDICATED_PATCH | TRANSDERMAL | Status: DC | PRN
  Administered 2012-10-10: 1 via TRANSDERMAL

## 2012-10-10 MED ORDER — MORPHINE SULFATE 2 MG/ML IJ SOLN
INTRAMUSCULAR | Status: AC
  Filled 2012-10-10: qty 1

## 2012-10-10 MED ORDER — ARTIFICIAL TEARS OP OINT
TOPICAL_OINTMENT | OPHTHALMIC | Status: DC | PRN
  Administered 2012-10-10: 1 via OPHTHALMIC

## 2012-10-10 MED ORDER — CLOPIDOGREL BISULFATE 75 MG PO TABS
75.0000 mg | ORAL_TABLET | Freq: Every day | ORAL | Status: DC
  Administered 2012-10-11 – 2012-10-12 (×2): 75 mg via ORAL
  Filled 2012-10-10 (×2): qty 1

## 2012-10-10 MED ORDER — DEXAMETHASONE SODIUM PHOSPHATE 4 MG/ML IJ SOLN
INTRAMUSCULAR | Status: DC | PRN
  Administered 2012-10-10: 8 mg via INTRAVENOUS

## 2012-10-10 MED ORDER — VECURONIUM BROMIDE 10 MG IV SOLR
INTRAVENOUS | Status: DC | PRN
  Administered 2012-10-10: 1 mg via INTRAVENOUS
  Administered 2012-10-10: 2 mg via INTRAVENOUS

## 2012-10-10 MED ORDER — LACTATED RINGERS IV SOLN
INTRAVENOUS | Status: DC | PRN
  Administered 2012-10-10 (×2): via INTRAVENOUS

## 2012-10-10 MED ORDER — HYDRALAZINE HCL 20 MG/ML IJ SOLN: 10.0000 mg | INTRAMUSCULAR | Status: DC | PRN

## 2012-10-10 MED ORDER — PROPOFOL 10 MG/ML IV BOLUS
INTRAVENOUS | Status: DC | PRN
  Administered 2012-10-10: 150 mg via INTRAVENOUS

## 2012-10-10 MED ORDER — ONDANSETRON HCL 4 MG/2ML IJ SOLN
INTRAMUSCULAR | Status: DC | PRN
  Administered 2012-10-10 (×2): 4 mg via INTRAVENOUS

## 2012-10-10 MED ORDER — SCOPOLAMINE 1 MG/3DAYS TD PT72
1.0000 | MEDICATED_PATCH | Freq: Once | TRANSDERMAL | Status: DC
  Filled 2012-10-10: qty 1

## 2012-10-10 MED ORDER — FENTANYL CITRATE 0.05 MG/ML IJ SOLN
INTRAMUSCULAR | Status: DC | PRN
  Administered 2012-10-10: 100 ug via INTRAVENOUS
  Administered 2012-10-10 (×2): 25 ug via INTRAVENOUS
  Administered 2012-10-10 (×2): 50 ug via INTRAVENOUS

## 2012-10-10 MED ORDER — ASPIRIN 325 MG PO TABS
325.0000 mg | ORAL_TABLET | Freq: Every day | ORAL | Status: DC
  Administered 2012-10-11 – 2012-10-12 (×2): 325 mg via ORAL
  Filled 2012-10-10 (×2): qty 1

## 2012-10-10 MED ORDER — HYDROMORPHONE HCL PF 1 MG/ML IJ SOLN
0.2500 mg | INTRAMUSCULAR | Status: DC | PRN
  Administered 2012-10-10: 0.5 mg via INTRAVENOUS

## 2012-10-10 MED ORDER — MIDAZOLAM HCL 5 MG/5ML IJ SOLN
INTRAMUSCULAR | Status: DC | PRN
  Administered 2012-10-10 (×2): 1 mg via INTRAVENOUS

## 2012-10-10 MED ORDER — GLYCOPYRROLATE 0.2 MG/ML IJ SOLN
INTRAMUSCULAR | Status: DC | PRN
  Administered 2012-10-10 (×2): 0.3 mg via INTRAVENOUS

## 2012-10-10 MED ORDER — LIDOCAINE HCL (CARDIAC) 20 MG/ML IV SOLN
INTRAVENOUS | Status: DC | PRN
  Administered 2012-10-10: 20 mg via INTRAVENOUS

## 2012-10-10 MED ORDER — METOPROLOL TARTRATE 1 MG/ML IV SOLN: 2.0000 mg | INTRAVENOUS | Status: DC | PRN

## 2012-10-10 MED ORDER — LABETALOL HCL 5 MG/ML IV SOLN
10.0000 mg | INTRAVENOUS | Status: DC | PRN
  Filled 2012-10-10: qty 4

## 2012-10-10 MED ORDER — HEPARIN SODIUM (PORCINE) 1000 UNIT/ML IJ SOLN
INTRAMUSCULAR | Status: DC | PRN
  Administered 2012-10-10: 10000 [IU] via INTRAVENOUS

## 2012-10-10 MED ORDER — SODIUM CHLORIDE 0.9 % IV SOLN
INTRAVENOUS | Status: DC
  Administered 2012-10-10: 12:00:00 via INTRAVENOUS

## 2012-10-10 MED ORDER — POTASSIUM CHLORIDE CRYS ER 20 MEQ PO TBCR: 20.0000 meq | EXTENDED_RELEASE_TABLET | Freq: Once | ORAL | Status: AC | PRN

## 2012-10-10 MED ORDER — SODIUM CHLORIDE 0.9 % IV SOLN
500.0000 mL | Freq: Once | INTRAVENOUS | Status: AC | PRN
  Administered 2012-10-10: 500 mL via INTRAVENOUS

## 2012-10-10 MED ORDER — SUCCINYLCHOLINE CHLORIDE 20 MG/ML IJ SOLN
INTRAMUSCULAR | Status: DC | PRN
  Administered 2012-10-10: 100 mg via INTRAVENOUS

## 2012-10-10 MED ORDER — VANCOMYCIN HCL IN DEXTROSE 1-5 GM/200ML-% IV SOLN
1000.0000 mg | Freq: Two times a day (BID) | INTRAVENOUS | Status: AC
  Administered 2012-10-10 – 2012-10-11 (×2): 1000 mg via INTRAVENOUS
  Filled 2012-10-10 (×2): qty 200

## 2012-10-10 MED ORDER — OXYCODONE HCL 5 MG/5ML PO SOLN: 5.0000 mg | Freq: Once | ORAL | Status: DC | PRN

## 2012-10-10 MED ORDER — PHENOL 1.4 % MT LIQD: 1.0000 | OROMUCOSAL | Status: DC | PRN

## 2012-10-10 MED ORDER — ENOXAPARIN SODIUM 40 MG/0.4ML ~~LOC~~ SOLN
40.0000 mg | SUBCUTANEOUS | Status: DC
  Administered 2012-10-11: 40 mg via SUBCUTANEOUS
  Filled 2012-10-10 (×2): qty 0.4

## 2012-10-10 MED ORDER — FENTANYL CITRATE 0.05 MG/ML IJ SOLN
25.0000 ug | INTRAMUSCULAR | Status: DC | PRN
  Administered 2012-10-10 (×3): 50 ug via INTRAVENOUS

## 2012-10-10 MED ORDER — ALUM & MAG HYDROXIDE-SIMETH 200-200-20 MG/5ML PO SUSP: 15.0000 mL | ORAL | Status: DC | PRN

## 2012-10-10 MED ORDER — GUAIFENESIN-DM 100-10 MG/5ML PO SYRP: 15.0000 mL | ORAL_SOLUTION | ORAL | Status: DC | PRN

## 2012-10-10 SURGICAL SUPPLY — 55 items
ADH SKN CLS APL DERMABOND .7 (GAUZE/BANDAGES/DRESSINGS) ×1
ADH SKN CLS LQ APL DERMABOND (GAUZE/BANDAGES/DRESSINGS) ×2
CANISTER SUCTION 2500CC (MISCELLANEOUS) ×2 IMPLANT
CANNULA VESSEL W/WING WO/VALVE (CANNULA) ×2 IMPLANT
CATH ROBINSON RED A/P 18FR (CATHETERS) ×2 IMPLANT
CLIP TI MEDIUM 24 (CLIP) ×2 IMPLANT
CLIP TI WIDE RED SMALL 24 (CLIP) ×2 IMPLANT
CLOTH BEACON ORANGE TIMEOUT ST (SAFETY) ×2 IMPLANT
COVER SURGICAL LIGHT HANDLE (MISCELLANEOUS) ×2 IMPLANT
CRADLE DONUT ADULT HEAD (MISCELLANEOUS) ×2 IMPLANT
DECANTER SPIKE VIAL GLASS SM (MISCELLANEOUS) IMPLANT
DERMABOND ADHESIVE PROPEN (GAUZE/BANDAGES/DRESSINGS) ×2
DERMABOND ADVANCED (GAUZE/BANDAGES/DRESSINGS) ×1
DERMABOND ADVANCED .7 DNX12 (GAUZE/BANDAGES/DRESSINGS) ×1 IMPLANT
DERMABOND ADVANCED .7 DNX6 (GAUZE/BANDAGES/DRESSINGS) IMPLANT
DRAIN HEMOVAC 1/8 X 5 (WOUND CARE) ×1 IMPLANT
DRAPE INCISE IOBAN 66X45 STRL (DRAPES) ×1 IMPLANT
DRAPE WARM FLUID 44X44 (DRAPE) ×2 IMPLANT
ELECT REM PT RETURN 9FT ADLT (ELECTROSURGICAL) ×2
ELECTRODE REM PT RTRN 9FT ADLT (ELECTROSURGICAL) ×1 IMPLANT
EVACUATOR SILICONE 100CC (DRAIN) ×1 IMPLANT
GEL ULTRASOUND 20GR AQUASONIC (MISCELLANEOUS) IMPLANT
GLOVE BIO SURGEON STRL SZ 6.5 (GLOVE) ×2 IMPLANT
GLOVE BIO SURGEON STRL SZ7.5 (GLOVE) ×2 IMPLANT
GLOVE BIOGEL PI IND STRL 6.5 (GLOVE) IMPLANT
GLOVE BIOGEL PI INDICATOR 6.5 (GLOVE) ×2
GLOVE ECLIPSE 6.0 STRL STRAW (GLOVE) ×1 IMPLANT
GLOVE ECLIPSE 6.5 STRL STRAW (GLOVE) ×2 IMPLANT
GLOVE SS N UNI LF 6.5 STRL (GLOVE) ×1 IMPLANT
GOWN PREVENTION PLUS XLARGE (GOWN DISPOSABLE) ×2 IMPLANT
GOWN STRL NON-REIN LRG LVL3 (GOWN DISPOSABLE) ×4 IMPLANT
KIT BASIN OR (CUSTOM PROCEDURE TRAY) ×2 IMPLANT
KIT CATH SUCT 8FR (CATHETERS) ×1 IMPLANT
KIT ROOM TURNOVER OR (KITS) ×2 IMPLANT
LOOP VESSEL MINI RED (MISCELLANEOUS) IMPLANT
NDL HYPO 25GX1X1/2 BEV (NEEDLE) IMPLANT
NEEDLE HYPO 25GX1X1/2 BEV (NEEDLE) IMPLANT
NS IRRIG 1000ML POUR BTL (IV SOLUTION) ×4 IMPLANT
PACK CAROTID (CUSTOM PROCEDURE TRAY) ×2 IMPLANT
PAD ARMBOARD 7.5X6 YLW CONV (MISCELLANEOUS) ×4 IMPLANT
SHUNT CAROTID BYPASS 10 (VASCULAR PRODUCTS) ×1 IMPLANT
SHUNT CAROTID BYPASS 12FRX15.5 (VASCULAR PRODUCTS) IMPLANT
SPONGE SURGIFOAM ABS GEL 100 (HEMOSTASIS) IMPLANT
SUT ETHILON 3 0 PS 1 (SUTURE) ×1 IMPLANT
SUT PROLENE 6 0 CC (SUTURE) ×2 IMPLANT
SUT PROLENE 7 0 BV 1 (SUTURE) ×2 IMPLANT
SUT SILK 3 0 TIES 17X18 (SUTURE)
SUT SILK 3-0 18XBRD TIE BLK (SUTURE) IMPLANT
SUT VIC AB 3-0 SH 27 (SUTURE) ×4
SUT VIC AB 3-0 SH 27X BRD (SUTURE) ×1 IMPLANT
SUT VICRYL 4-0 PS2 18IN ABS (SUTURE) ×3 IMPLANT
SYR CONTROL 10ML LL (SYRINGE) IMPLANT
TOWEL OR 17X24 6PK STRL BLUE (TOWEL DISPOSABLE) ×2 IMPLANT
TOWEL OR 17X26 10 PK STRL BLUE (TOWEL DISPOSABLE) ×2 IMPLANT
WATER STERILE IRR 1000ML POUR (IV SOLUTION) ×2 IMPLANT

## 2012-10-10 NOTE — Op Note (Signed)
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 The patient was placed in a supine position on the operating room table. After induction of general anesthesia, the patient's entire neck and chest was prepped and draped in the usual sterile fashion as well as the left groin.  A longitudinal incision was made in the left groin of the area of the greater saphenous vein.  The incision was carried through the subcutaneous tissues to the level of the saphenous vein which was of good quality 3mm diameter.   An oblique incision was made on the left aspect of the patient's neck anterior to the border the left sternocleidomastoid muscle. The incision was carried into the subcutaneous tissues and through the platysma. The sternocleidomastoid muscle was identified and reflected laterally. The common carotid artery was then found at the base of the incision this was dissected free circumferentially. It was fairly soft on palpation.  The vagus nerve was identified and protected. Dissection was then carried up to the level carotid bifurcation.   The hyperglossal nerve was well above the primary area of dissection. The internal carotid artery was dissected free circumferentially just below the level of the hypoglossal nerve and it was soft in character at this location and above any palpable disease. A vessel loop was placed around this. The vessel was fairly small. Next the external carotid and superior thyroid arteries  were dissected free circumferentially and vessel loops were placed around these. The patient was given 10000 units of intravenous heparin.  After 2 minutes of circulation time and raising the mean arterial pressure to 90 mm mercury, the distal internal carotid artery was controlled with small bulldog clamp. The external carotid and superior thyroid arteries were controlled with vessel loops. The common carotid artery was controlled with a peripheral DeBakey clamp. A longitudinal opening was made in the common carotid artery just below the bifurcation. The arteriotomy was extended distally up into the internal carotid with Potts scissors. There was a large rubbery plaque with greater than 70% stenosis in the internal carotid.  This was a small vessel. The 10 Fr would not fit.  An 8 Fr pediatric feeding tube was brought onto the field and fashioned to fit the patient's artery.  This was threaded into the distal internal carotid artery and allowed to backbleed thoroughly.  There was pulsatile backbleeding.  This was then threaded into the common carotid and secured with a Rummel tourniquet.  There was no air at this point and flow was restored to the brain.  Attention was then turned to the common carotid artery once again. A suitable endarterectomy plane was obtained and endarterectomy was begun in the common carotid artery and a good proximal endpoint was obtained. An  eversion endarterectomy was performed on the external carotid artery and a good endpoint was obtained. The plaque was then elevated in the internal carotid artery and a nice feathered distal endpoint was also obtained.  The plaque was passed off the table. All loose debris was then removed from the carotid bed and everything was thoroughly irrigated with heparinized saline. Several centimeters of the left greater saphenous vein was then ligated proximally and distally and excised.  This was reversed and flushed with heparinized saline then opened  longitudinally.  The vein was sewn on as a patch angioplasty using a running 6-0 Prolene suture. Prior to completion of the anastomosis, the shunt was clamped and removed and the internal and common carotid recontrolled.  The internal carotid artery was thoroughly backbled. This was then controlled again with a fine bulldog clamp.  The common carotid was thoroughly flushed forward. The external carotid was also thoroughly backbled.  The remainder of the patch was completed and the anastomosis was secured. Flow was then restored first retrograde from the external carotid into the carotid bed then antegrade from the common carotid to the external carotid artery and after approximately 5 cardiac cycles to the internal carotid artery. Doppler was used to evaluate the external/internal and common carotid arteries and these all had good Doppler flow. Hemostasis was obtained with 1 additional repair suture. The patient was also given 100 mg of Protamine.      The platysma muscle was reapproximated using a running 3-0 Vicryl suture. The skin was closed with 4 0 Vicryl subcuticular stitch.  The left groin was closed with multiple layers of 3 0 Vicryl followed by a 4 0 Vicryl subcuticular stitch.  Dermabond was applied to both incisions.  The patient was awakened in the operating room and was moving upper and lower extremities symmetrically and following commands.  The patient was stable on arrival to the PACU.  Fabienne Bruns, MD [facility] Office: [phone] Pager: [phone]

## 2012-10-10 NOTE — Telephone Encounter (Addendum)
Message copied by Rosalyn Charters on Tue Oct 10, 2012 11:19 AM ------      Message from: Sharee Pimple      Created: Tue Oct 10, 2012 10:29 AM      Regarding: schedule                   ----- Message -----         From: Dara Lords, PA-C         Sent: 10/10/2012  10:13 AM           To: Sharee Pimple, CMA            S/p left CEA with SVG left groin 10/10/12.  F/u with Dr. Darrick Penna in 2 weeks.            Thanks,      Lelon Mast ------  unable to reach patient by phone,, mailed appt. letter notifying patinet of fu appt. on 10-26-12 at 10:45 as per staff message 10-10-12

## 2012-10-10 NOTE — OR Nursing (Signed)
Preop and postop neuro checks the same. Handgrips, left>right, tongue midline, moves all four extremities. TLClayton RNFA

## 2012-10-10 NOTE — Progress Notes (Signed)
Pt states she is hurting.  Dr Chaney Malling aware. Received order for Dilaudid up to 2 mg.

## 2012-10-10 NOTE — Transfer of Care (Signed)
Immediate Anesthesia Transfer of Care Note  Patient: [name]  Procedure(s) Performed: Procedure(s): ENDARTERECTOMY CAROTID- LEFT NECK WITH GREATER SAPHENOUS VEIN PATCH LEFT LEG (Left)  Patient Location: PACU  Anesthesia Type:General  Level of Consciousness: awake, alert , oriented, patient cooperative and responds to stimulation  Airway & Oxygen Therapy: Patient Spontanous Breathing and Patient connected to nasal cannula oxygen  Post-op Assessment: Report given to PACU RN, Post -op Vital signs reviewed and stable and Patient moving all extremities X 4  Post vital signs: Reviewed and stable  Complications: No apparent anesthesia complications

## 2012-10-10 NOTE — Care Management Note (Signed)
Page 1 of 2   10/12/2012     11:53:57 AM   CARE MANAGEMENT NOTE 10/12/2012  Patient:  [name], [name]   Account Number:  [id number]  Date Initiated:  10/03/2012  Documentation initiated by:  [facility]  Subjective/Objective Assessment:   cholecystitis.  Has spouse     Action/Plan:   PT eval   Anticipated DC Date:  10/12/2012   Anticipated DC Plan:  HOME/SELF CARE      DC Planning Services  CM consult  Outpatient Services - Pt will follow up      [facility] Choice  HOME HEALTH   Choice offered to / List presented to:  C-1 Patient        HH arranged  HH - 11 Patient Refused      Status of service:  Completed, signed off Medicare Important Message given?   (If response is "NO", the following Medicare IM given date fields will be blank) Date Medicare IM given:   Date Additional Medicare IM given:    Discharge Disposition:    Per UR Regulation:  Reviewed for med. necessity/level of care/duration of stay  If discussed at Long Length of Stay Meetings, dates discussed:   10/12/2012    Comments:  Contact:  Aguado,David Spouse [phone]  10/12/12 Sarah Frisby,RN,BSN [phone] PT GIVEN RX FOR OUTPATIENT PHYSICAL THERAPY AND OCCUPATIONAL THERAPY, HOWEVER BEDSIDE RN STATES PT DOES NOT DESIRE TO DO THERAPY AT DC.  10/11/12- 1430- Donn Pierini RN, BSN [phone] F/u done with pt and spouse regarding d/c needs- per Dr. Benjamine Mola pt will need Plavix at discharge- call made to pt's pharmacy [facility] and benefits check done per both checks - Blue Medicare-- Plavix not covered, however, generic brand is covered - if patient goes to a preferred pharmacy ([facility], [facility], [facility]) it would cost $3.00/30 day supply.  If they go to a non-preferred it would cost $8.00. No prior auth required for generic brand. Per conversation with pt - she can not take generic Plavix- offered that they may need to check with insurance provider to see if there was any documentation that they can provide with current CVA  dx where it would make a difference in the Brand name Plavix being covered- pt/spouse to f/u with insurance- another option in mean time is to see if Dr. Marlis Edelson office may have samples of the brand name to provide to pt.  Following PT eval- recommendation are for outpt and it is not felt that pt needs neuro outpt- Dr. Benjamine Mola aware that pt will need prescription for outpt PT/OT- as pt and spouse do not want HH and desire to f/u with outpt therapy on their own. Plan is for pt to d/c in am- will f/u on any further needs for d/c-   10/10/12- 1530- Donn Pierini RN, BSN [phone] Referral for [facility] - RN/PT/OT/aide received- in to speak with pt and spouse- Onalee Hua at bedside regarding HH orders and d/c needs- per conversation pt and spouse do not feel like HH would be the best option for pt and would rather do outpt therapy. They state that they have needed DME at home that includes RW, cane, BSC, shower seat and that pt will have assistance with ADL as needed - they feel like they can manage the ADLs at home- long discussion on the differences between [facility] and outpt therapy- with list of HH agencies for [place] left with pt and spouse for consideration- but also gave information on [facility] outpt neuro rehab- pt/spouse  at this time refusing HH in favor of outpt- NCM to f/u in am regarding preference for outpt- MD- pt will need prescription for outpt PT/OT-  In addition they would like information on meals on wheels for [facility]- will ask CSW to see pt regarding this in am.

## 2012-10-10 NOTE — Progress Notes (Signed)
[facility] PROGRESS NOTE  [name] ZOX:[id number] DOB: January 21, 1946 DOA: 09/30/2012 PCP: [name], MD  Assessment/Plan: Weakness- +MRI for CVA - neurology consulted -appreciate their assistance (FLp- LDL 23, HDL 11, HgbA1C) - Pt/OT/SLP -Vascular surgery -- s/p CEA on Tuesday  - difficult situation with a complicated patient  Hypotension- -monitor -baseline low 100s to 90s systolic  Cholelithiasis-acute cholecystitis, patient had a percutaneous drain placed on 7/7, WBC has trended down, status post HIDA scan with cystic duct obstruction, surgery following,  Drain to stay in for 6-8 weeks then re-evaluation for cholecystecomy  abx per surgery- d/c abx  Chronic pain -trying to schedule as patient takes at home  Shortness of breath  Wean off O2 -resume lasix- give dose x 1 IV this PM -echo ok   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 Should not get above 7 per hematology (Ennever, transfused one unit by Dr. Twanna Hy on 7/8, hemoglobin up to 6.2 ) Suspect with lab draws will slowly decrease to desired levels -Ennover ordered 500cc plebotomy- Hgb 6.1-6.8 do not think patient will need  Hypertension, discontinue HCTZ and d/c lisinopril  CAD Pt's Cardiologist note fm 09/28/2010 She does not have a history of known ischemic heart disease. She had an echocardiogram in 12/04/09 which showed normal left ventricular systolic function with an ejection fraction of 55-60% and normal diastolic function and mild aortic sclerosis.    Hypothyroidism; per patient and husband unable to take Synthroid. On Armour Thyroid 120 mg daily (patient has home meds) TSH suppressed; free t4 ok and will need to recheck as an outaptient  Constipation- added bowel regimen with enema- did not need enema     Code Status: full Family Communication: patient/husband- answered all the questions Disposition Plan: home health PT/OT/SLP/RN?      HPI/Subjective: S/p CEA Hurting  some No SOB   Objective: Filed Vitals:   10/10/12 1130 10/10/12 1138 10/10/12 1145 10/10/12 1156  BP:  101/44  94/45  Pulse: 57 57 55 56  Temp:    97.2 F (36.2 C)  TempSrc:      Resp: 14 14 13 14   Height:      Weight:      SpO2: 93% 94% 95% 95%    Intake/Output Summary (Last 24 hours) at 10/10/12 1236 Last data filed at 10/10/12 1200  Gross per 24 hour  Intake   1150 ml  Output   1900 ml  Net   -750 ml   Filed Weights   10/06/12 1330 10/07/12 0509 10/10/12 0500  Weight: 74.39 kg (164 lb) 74.41 kg (164 lb 0.7 oz) 80.5 kg (177 lb 7.5 oz)    Exam:   General:  sleepy  Cardiovascular: rrr  Respiratory: no wheezing  Abdomen: +BS, soft  Mild LE edema   Data Reviewed: Basic Metabolic Panel:  Recent Labs Lab 10/04/12 1309 10/05/12 1000 10/06/12 1155 10/09/12 0500 10/10/12 0550  NA 135 134* 136 134* 136  K 3.8 3.9 4.4 4.6 4.5  CL 103 97 101 98 98  CO2 23 27 31 31  32  GLUCOSE 90 134* 108* 92 95  BUN 8 8 7 7 6   CREATININE 0.57 0.60 0.59 0.71 0.69  CALCIUM 8.2* 8.6 8.3* 8.9 8.6   Liver Function Tests:  Recent Labs Lab 10/04/12 1309  AST 13  ALT 9  ALKPHOS 46  BILITOT 0.4  PROT 5.6*  ALBUMIN 2.5*   No results found for this basename: LIPASE, AMYLASE,  in the last 168 hours No results  found for this basename: AMMONIA,  in the last 168 hours CBC:  Recent Labs Lab 10/05/12 1000 10/06/12 1155 10/07/12 0700 10/08/12 0630 10/09/12 0500 10/10/12 0550  WBC 11.7* 8.6 8.1 8.7 6.4 5.1  NEUTROABS 9.7*  --   --   --   --   --   HGB 7.1* 6.4* 6.1* 6.8* 6.5* 6.8*  HCT 26.7* 24.4* 23.5* 25.8* 24.9* 25.9*  MCV 64.5* 65.6* 66.0* 65.8* 65.7* 65.6*  PLT 231 221 196 222 227 238   Cardiac Enzymes:  Recent Labs Lab 10/04/12 1323  TROPONINI <0.30   BNP (last 3 results)  Recent Labs  10/05/12 0455  PROBNP 2326.0*   CBG:  Recent Labs Lab 10/03/12 1554 10/04/12 0009 10/04/12 0804  GLUCAP 93 101* 86    Recent Results (from the past 240  hour(s))  URINE CULTURE     Status: None   Collection Time    10/01/12  9:04 PM      Result Value Range Status   Specimen Description URINE, CLEAN CATCH   Final   Special Requests NONE   Final   Culture  Setup Time 10/02/2012 02:48   Final   Colony Count NO GROWTH   Final   Culture NO GROWTH   Final   Report Status 10/03/2012 FINAL   Final  SURGICAL PCR SCREEN     Status: None   Collection Time    10/01/12 11:28 PM      Result Value Range Status   MRSA, PCR NEGATIVE  NEGATIVE Final   Staphylococcus aureus NEGATIVE  NEGATIVE Final   Comment:            The Xpert SA Assay (FDA     approved for NASAL specimens     in patients over 77 years of age),     is one component of     a comprehensive surveillance     program.  Test performance has     been validated by [employer] for patients greater     than or equal to 11 year old.     It is not intended     to diagnose infection nor to     guide or monitor treatment.  CULTURE, ROUTINE-ABSCESS     Status: None   Collection Time    10/02/12  1:55 PM      Result Value Range Status   Specimen Description ABSCESS GALL BLADDER   Final   Special Requests NONE   Final   Gram Stain     Final   Value: NO WBC SEEN     NO SQUAMOUS EPITHELIAL CELLS SEEN     MODERATE GRAM POSITIVE RODS     MODERATE GRAM POSITIVE COCCI     IN PAIRS FEW GRAM NEGATIVE RODS   Culture     Final   Value: MULTIPLE ORGANISMS PRESENT, NONE PREDOMINANT     Note: NO STAPHYLOCOCCUS AUREUS ISOLATED NO GROUP A STREP (S.PYOGENES) ISOLATED   Report Status 10/05/2012 FINAL   Final  ANAEROBIC CULTURE     Status: None   Collection Time    10/02/12  1:55 PM      Result Value Range Status   Specimen Description ABSCESS GALL BLADDER   Final   Special Requests NONE   Final   Gram Stain     Final   Value: NO WBC SEEN     NO SQUAMOUS EPITHELIAL CELLS SEEN     MODERATE GRAM  POSITIVE RODS     MODERATE GRAM POSITIVE COCCI     IN PAIRS FEW GRAM NEGATIVE RODS   Culture NO  ANAEROBES ISOLATED   Final   Report Status 10/07/2012 FINAL   Final  MRSA PCR SCREENING     Status: None   Collection Time    10/02/12  2:09 PM      Result Value Range Status   MRSA by PCR NEGATIVE  NEGATIVE Final   Comment:            The GeneXpert MRSA Assay (FDA     approved for NASAL specimens     only), is one component of a     comprehensive MRSA colonization     surveillance program. It is not     intended to diagnose MRSA     infection nor to guide or     monitor treatment for     MRSA infections.  CULTURE, BLOOD (ROUTINE X 2)     Status: None   Collection Time    10/02/12  2:30 PM      Result Value Range Status   Specimen Description BLOOD RIGHT ARM   Final   Special Requests BOTTLES DRAWN AEROBIC AND ANAEROBIC 10CC   Final   Culture  Setup Time 10/02/2012 21:33   Final   Culture NO GROWTH 5 DAYS   Final   Report Status 10/08/2012 FINAL   Final  CULTURE, BLOOD (ROUTINE X 2)     Status: None   Collection Time    10/02/12  2:40 PM      Result Value Range Status   Specimen Description BLOOD RIGHT ARM   Final   Special Requests BOTTLES DRAWN AEROBIC AND ANAEROBIC 10CC   Final   Culture  Setup Time 10/02/2012 21:33   Final   Culture NO GROWTH 5 DAYS   Final   Report Status 10/08/2012 FINAL   Final     Studies: No results found.  Scheduled Meds: . antiseptic oral rinse  15 mL Mouth Rinse q12n4p  . [START ON 10/11/2012] aspirin  325 mg Oral Daily  . bisoprolol  10 mg Oral Daily  . [START ON 10/11/2012] clopidogrel  75 mg Oral Daily  . docusate sodium  100 mg Oral Daily  . [START ON 10/11/2012] enoxaparin (LOVENOX) injection  40 mg Subcutaneous Q24H  . estrogens (conjugated)  0.625 mg Oral BID  . fentaNYL      . fentaNYL      . furosemide  40 mg Oral Daily  . HYDROmorphone      . mineral oil   Oral Q2000  . OxyCODONE  20 mg Oral Custom  . pantoprazole  40 mg Oral Q1200  . polyethylene glycol  17 g Oral TID  . potassium chloride  40 mEq Oral BID  . senna  1  tablet Oral TID  . thyroid  150 mg Oral QAC breakfast  . vancomycin  1,000 mg Intravenous Q12H   Continuous Infusions: . sodium chloride 75 mL/hr at 10/10/12 1222  . DOPamine      Principal Problem:   Benign hypertensive heart disease without heart failure Active Problems:   Hypothyroidism   Polycythemia vera(238.4)   Fibromyalgia   CAD (coronary artery disease)   Cholelithiases   Unspecified constipation   Septic shock   CVA (cerebral infarction)   Acute cholecystitis    Time spent: 25    Marlin Canary  [facility] Pager [phone]. If 7PM-7AM, please contact night-coverage  at [email or web address], password [facility] 10/10/2012, 12:36 PM  LOS: 10 days

## 2012-10-10 NOTE — Interval H&P Note (Signed)
History and Physical Interval Note:  10/10/2012 7:25 AM  [name]  has presented today for surgery, with the diagnosis of Left Brain Stroke with Left Internal Carotid Artery Stenosis  The various methods of treatment have been discussed with the patient and family. After consideration of risks, benefits and other options for treatment, the patient has consented to  Left carotid endarterectomy with left leg vein harvest as a surgical intervention .  The patient's history has been reviewed, patient examined, no change in status, stable for surgery.  I have reviewed the patient's chart and labs.  Questions were answered to the patient's satisfaction.     Zahriyah Joo E

## 2012-10-10 NOTE — Anesthesia Preprocedure Evaluation (Addendum)
Anesthesia Evaluation  Patient identified by MRN, date of birth, ID band Patient awake    Reviewed: Allergy & Precautions, H&P , NPO status , Patient's Chart, lab work & pertinent test results, reviewed documented beta blocker date and time   Airway Mallampati: II  Neck ROM: full    Dental  (+) Teeth Intact and Dental Advisory Given   Pulmonary neg pulmonary ROS,  breath sounds clear to auscultation        Cardiovascular hypertension, Pt. on medications and Pt. on home beta blockers + CAD + dysrhythmias Rhythm:Regular Rate:Normal     Neuro/Psych  Neuromuscular disease CVA    GI/Hepatic Neg liver ROS, GERD-  Medicated and Controlled,  Endo/Other  Hypothyroidism   Renal/GU negative Renal ROS     Musculoskeletal  (+) Fibromyalgia -  Abdominal   Peds  Hematology  (+) Blood dyscrasia, ,   Anesthesia Other Findings   Reproductive/Obstetrics                          Anesthesia Physical Anesthesia Plan  ASA: III  Anesthesia Plan: General   Post-op Pain Management:    Induction: Intravenous  Airway Management Planned: Oral ETT  Additional Equipment: Arterial line  Intra-op Plan:   Post-operative Plan: Extubation in OR and Possible Post-op intubation/ventilation  Informed Consent: I have reviewed the patients History and Physical, chart, labs and discussed the procedure including the risks, benefits and alternatives for the proposed anesthesia with the patient or authorized representative who has indicated his/her understanding and acceptance.     Plan Discussed with: CRNA, Anesthesiologist and Surgeon  Anesthesia Plan Comments:        Anesthesia Quick Evaluation

## 2012-10-10 NOTE — H&P (View-Only) (Signed)
Right hand still clumsy but much improved Questions answered with pt and husband regarding left CEA NPO p midnight Consent  Left CEA tomorrow  Fabienne Bruns, MD [facility] Office: [phone] Pager: [phone]

## 2012-10-10 NOTE — Preoperative (Signed)
Beta Blockers   Reason not to administer Beta Blockers:Not Applicable, took at 1028 7/14

## 2012-10-10 NOTE — Progress Notes (Signed)
OT Cancellation Note  Patient Details Name: [name] MRN: [id number] DOB: 08-17-1945   Cancelled Treatment:    Reason Eval/Treat Not Completed: Patient at procedure or test/ unavailable - MD, please indicate when pt okay to resume therapies.  Thanks   Jeani Hawking M 10/10/2012, 1:02 PM

## 2012-10-11 ENCOUNTER — Other Ambulatory Visit: Payer: Medicare Other | Admitting: Lab

## 2012-10-11 LAB — BASIC METABOLIC PANEL
BUN: 8 mg/dL (ref 6–23)
Calcium: 8.4 mg/dL (ref 8.4–10.5)
Creatinine, Ser: 0.64 mg/dL (ref 0.50–1.10)
GFR calc Af Amer: >90 mL/min (ref >90)
GFR calc non Af Amer: 89 mL/min — ABNORMAL LOW (ref >90)
Potassium: 4.7 mEq/L (ref 3.5–5.1)

## 2012-10-11 LAB — CBC
Hemoglobin: 6.1 g/dL — CL (ref 12.0–15.0)
MCV: 65.5 fL — ABNORMAL LOW (ref 78.0–100.0)
RBC: 3.48 MIL/uL — ABNORMAL LOW (ref 3.87–5.11)
RDW: 27.8 % — ABNORMAL HIGH (ref 11.5–15.5)
WBC: 6.8 10*3/uL (ref 4.0–10.5)

## 2012-10-11 MED ORDER — POTASSIUM CHLORIDE CRYS ER 20 MEQ PO TBCR
40.0000 meq | EXTENDED_RELEASE_TABLET | Freq: Every day | ORAL | Status: DC
  Filled 2012-10-11: qty 2

## 2012-10-11 NOTE — Progress Notes (Signed)
CSW provided Pt with [facility] on [address].   Leron Croak, LCSWA [facility] Emergency Dept.  [phone]

## 2012-10-11 NOTE — Progress Notes (Signed)
Patient ID: [name], female   DOB: 1945-07-07, 67 y.o.   MRN: [id number] 1 Day Post-Op  Subjective: Actually feels well today after CEA, husband and pt have concerns that her perc drain is not being cared for properly on this floor.  Objective: Vital signs in last 24 hours: Temp:  [97.2 F (36.2 C)-99 F (37.2 C)] 98.2 F (36.8 C) (07/16 0800) Pulse Rate:  [55-74] 74 (07/16 0407) Resp:  [13-23] 20 (07/16 0407) BP: (94-108)/(40-53) 104/51 mmHg (07/16 0800) SpO2:  [90 %-99 %] 94 % (07/16 0407) Arterial Line BP: (94-147)/(38-55) 114/43 mmHg (07/16 0000) Last BM Date: 10/10/12  Intake/Output from previous day: 07/15 0701 - 07/16 0700 In: 2450 [P.O.:360; I.V.:1865; IV Piggyback:200] Out: 1590 [Urine:1225; Drains:365] Intake/Output this shift: Total I/O In: 260 [P.O.:240; I.V.:20] Out: 70 [Drains:70]  PE: Abd: soft, NT, ND, chole drain with bilious output.  Lab Results:   Recent Labs  10/10/12 0550 10/11/12 0434  WBC 5.1 6.8  HGB 6.8* 6.1*  HCT 25.9* 22.8*  PLT 238 254   BMET  Recent Labs  10/10/12 0550 10/11/12 0434  NA 136 134*  K 4.5 4.7  CL 98 102  CO2 32 27  GLUCOSE 95 107*  BUN 6 8  CREATININE 0.69 0.64  CALCIUM 8.6 8.4   PT/INR No results found for this basename: LABPROT, INR,  in the last 72 hours CMP     Component Value Date/Time   NA 134* 10/11/2012 0434   K 4.7 10/11/2012 0434   CL 102 10/11/2012 0434   CO2 27 10/11/2012 0434   GLUCOSE 107* 10/11/2012 0434   BUN 8 10/11/2012 0434   CREATININE 0.64 10/11/2012 0434   CALCIUM 8.4 10/11/2012 0434   PROT 5.6* 10/04/2012 1309   ALBUMIN 2.5* 10/04/2012 1309   AST 13 10/04/2012 1309   ALT 9 10/04/2012 1309   ALKPHOS 46 10/04/2012 1309   BILITOT 0.4 10/04/2012 1309   GFRNONAA 89* 10/11/2012 0434   GFRAA >90 10/11/2012 0434   Lipase     Component Value Date/Time   LIPASE 23 09/30/2012 0039       Studies/Results: No results found.  Anti-infectives: Anti-infectives   Start     Dose/Rate Route Frequency  Ordered Stop   10/10/12 1600  vancomycin (VANCOCIN) IVPB 1000 mg/200 mL premix     1,000 mg 200 mL/hr over 60 Minutes Intravenous Every 12 hours 10/10/12 1218 10/11/12 0516   10/10/12 0600  [MAR Hold]  vancomycin (VANCOCIN) 1,250 mg in sodium chloride 0.9 % 250 mL IVPB     (On MAR Hold since 10/10/12 0625)   1,250 mg 166.7 mL/hr over 90 Minutes Intravenous On call to O.R. 10/09/12 1019 10/10/12 0643   10/04/12 1300  ciprofloxacin (CIPRO) tablet 500 mg     500 mg Oral 2 times daily 10/04/12 1121 10/09/12 2021   10/01/12 0815  metroNIDAZOLE (FLAGYL) IVPB 500 mg  Status:  Discontinued     500 mg 100 mL/hr over 60 Minutes Intravenous Every 8 hours 10/01/12 0810 10/03/12 0816   09/30/12 1200  ciprofloxacin (CIPRO) IVPB 400 mg  Status:  Discontinued     400 mg 200 mL/hr over 60 Minutes Intravenous Every 12 hours 09/30/12 1057 10/04/12 1121       Assessment/Plan  1. Acute cholecystitis, s/p perc drain 2. CVA 3. PCV  Plan: 1. Ensure drain flushes 2. Cont current drain care.  3. Assured pt and husband that we will make sure that arrangements are made for the  drain care at home  LOS: 11 days    Hobbs, Sarah 10/11/2012, 10:46 AM

## 2012-10-11 NOTE — Progress Notes (Signed)
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 She ate a large meal at a cookout yesterday prior to the onset of her symptoms. She was brought to the ED, where she was ruled out for any cardiac event. CTA showed no sign of ischemic events in the chest or abd/pelvis. She does have significant gallstones with increased CBD diameter (8.2 mm).   -drain placed as patient too sick for surgery, during stay she developed stroke like symptoms- MRI + and CEA done on 7/15-- hope for d/c on 7/17  Assessment/Plan: Weakness- +MRI for CVA - neurology consulted -appreciate their assistance (FLp- LDL 23, HDL 11, HgbA1C) - Pt/OT/SLP- patient would like to follow with outpatient PT -Vascular surgery -- s/p CEA on Tuesday  - difficult situation with a complicated patient  Hypotension- -monitor -baseline low 100s to 90s systolic  Cholelithiasis-acute cholecystitis, patient had a percutaneous drain placed on 7/7, WBC has trended down, status post HIDA scan with cystic duct obstruction, surgery following,  Drain to stay in for 6-8 weeks then re-evaluation for cholecystecomy  abx per surgery- d/c abx  Chronic pain -trying to schedule as patient takes at home  Shortness of breath  Wean off O2 -resume lasix- give dose x 1 IV this PM -echo ok   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 Should not get above 7 per hematology (Ennever, transfused one unit by Dr. Twanna Hy on 7/8, hemoglobin up to 6.2 ) Suspect with lab draws will slowly decrease to desired levels -Ennover ordered 500cc plebotomy- Hgb 6.1-6.8 do not think patient will need  Hypertension,  discontinue HCTZ and d/c lisinopril  CAD Pt's Cardiologist note fm 09/28/2010 She does not have a history of known ischemic heart disease. She had an echocardiogram in 12/04/09 which showed normal left ventricular systolic function with an ejection fraction of 55-60% and normal diastolic function and mild aortic sclerosis.    Hypothyroidism; per patient and husband unable to take Synthroid. On Armour Thyroid 120 mg daily (patient has home meds) TSH suppressed; free t4 ok and will need to recheck as an outaptient  Constipation- added bowel regimen with enema- did not need enema  -called Pharmacy- plavix is $3.00 with the insurance the patient has  Consults: [facility] (vascular) Neuro   Code Status: full Family Communication: patient/husband- answered all the questions Disposition Plan: outpatient PT       HPI/Subjective: Doing much better today  Objective: Filed Vitals:   10/10/12 1931 10/11/12 0000 10/11/12 0407 10/11/12 0800  BP: 101/53  108/51 104/51  Pulse: 70 62 74   Temp: 98.6 F (37 C)  99 F (37.2 C) 98.2 F (36.8 C)  TempSrc: Oral  Oral Oral  Resp: 23 14 20    Height:      Weight:      SpO2: 93% 90% 94%     Intake/Output Summary (Last 24 hours) at 10/11/12 1242 Last data filed at 10/11/12 0800  Gross per 24 hour  Intake   1560 ml  Output   1460 ml  Net    100 ml   Filed Weights   10/06/12 1330 10/07/12 0509 10/10/12 0500  Weight: 74.39 kg (164 lb) 74.41 kg (164 lb 0.7 oz)  80.5 kg (177 lb 7.5 oz)    Exam:   General:  Awake, no issues  Cardiovascular: rrr  Respiratory: no wheezing  Abdomen: +BS, soft  Mild LE edema   Data Reviewed: Basic Metabolic Panel:  Recent Labs Lab 10/05/12 1000 10/06/12 1155 10/09/12 0500 10/10/12 0550 10/11/12 0434  NA 134* 136 134* 136 134*  K 3.9 4.4 4.6 4.5 4.7  CL 97 101 98 98 102  CO2 27 31 31  32 27  GLUCOSE 134* 108* 92 95 107*  BUN 8 7 7 6 8   CREATININE 0.60 0.59 0.71 0.69 0.64  CALCIUM 8.6  8.3* 8.9 8.6 8.4   Liver Function Tests:  Recent Labs Lab 10/04/12 1309  AST 13  ALT 9  ALKPHOS 46  BILITOT 0.4  PROT 5.6*  ALBUMIN 2.5*   No results found for this basename: LIPASE, AMYLASE,  in the last 168 hours No results found for this basename: AMMONIA,  in the last 168 hours CBC:  Recent Labs Lab 10/05/12 1000  10/07/12 0700 10/08/12 0630 10/09/12 0500 10/10/12 0550 10/11/12 0434  WBC 11.7*  < > 8.1 8.7 6.4 5.1 6.8  NEUTROABS 9.7*  --   --   --   --   --   --   HGB 7.1*  < > 6.1* 6.8* 6.5* 6.8* 6.1*  HCT 26.7*  < > 23.5* 25.8* 24.9* 25.9* 22.8*  MCV 64.5*  < > 66.0* 65.8* 65.7* 65.6* 65.5*  PLT 231  < > 196 222 227 238 254  < > = values in this interval not displayed. Cardiac Enzymes:  Recent Labs Lab 10/04/12 1323  TROPONINI <0.30   BNP (last 3 results)  Recent Labs  10/05/12 0455  PROBNP 2326.0*   CBG: No results found for this basename: GLUCAP,  in the last 168 hours  Recent Results (from the past 240 hour(s))  URINE CULTURE     Status: None   Collection Time    10/01/12  9:04 PM      Result Value Range Status   Specimen Description URINE, CLEAN CATCH   Final   Special Requests NONE   Final   Culture  Setup Time 10/02/2012 02:48   Final   Colony Count NO GROWTH   Final   Culture NO GROWTH   Final   Report Status 10/03/2012 FINAL   Final  SURGICAL PCR SCREEN     Status: None   Collection Time    10/01/12 11:28 PM      Result Value Range Status   MRSA, PCR NEGATIVE  NEGATIVE Final   Staphylococcus aureus NEGATIVE  NEGATIVE Final   Comment:            The Xpert SA Assay (FDA     approved for NASAL specimens     in patients over 5 years of age),     is one component of     a comprehensive surveillance     program.  Test performance has     been validated by [employer] for patients greater     than or equal to 14 year old.     It is not intended     to diagnose infection nor to     guide or monitor treatment.  CULTURE,  ROUTINE-ABSCESS     Status: None   Collection Time    10/02/12  1:55 PM      Result Value Range Status   Specimen Description ABSCESS  GALL BLADDER   Final   Special Requests NONE   Final   Gram Stain     Final   Value: NO WBC SEEN     NO SQUAMOUS EPITHELIAL CELLS SEEN     MODERATE GRAM POSITIVE RODS     MODERATE GRAM POSITIVE COCCI     IN PAIRS FEW GRAM NEGATIVE RODS   Culture     Final   Value: MULTIPLE ORGANISMS PRESENT, NONE PREDOMINANT     Note: NO STAPHYLOCOCCUS AUREUS ISOLATED NO GROUP A STREP (S.PYOGENES) ISOLATED   Report Status 10/05/2012 FINAL   Final  ANAEROBIC CULTURE     Status: None   Collection Time    10/02/12  1:55 PM      Result Value Range Status   Specimen Description ABSCESS GALL BLADDER   Final   Special Requests NONE   Final   Gram Stain     Final   Value: NO WBC SEEN     NO SQUAMOUS EPITHELIAL CELLS SEEN     MODERATE GRAM POSITIVE RODS     MODERATE GRAM POSITIVE COCCI     IN PAIRS FEW GRAM NEGATIVE RODS   Culture NO ANAEROBES ISOLATED   Final   Report Status 10/07/2012 FINAL   Final  MRSA PCR SCREENING     Status: None   Collection Time    10/02/12  2:09 PM      Result Value Range Status   MRSA by PCR NEGATIVE  NEGATIVE Final   Comment:            The GeneXpert MRSA Assay (FDA     approved for NASAL specimens     only), is one component of a     comprehensive MRSA colonization     surveillance program. It is not     intended to diagnose MRSA     infection nor to guide or     monitor treatment for     MRSA infections.  CULTURE, BLOOD (ROUTINE X 2)     Status: None   Collection Time    10/02/12  2:30 PM      Result Value Range Status   Specimen Description BLOOD RIGHT ARM   Final   Special Requests BOTTLES DRAWN AEROBIC AND ANAEROBIC 10CC   Final   Culture  Setup Time 10/02/2012 21:33   Final   Culture NO GROWTH 5 DAYS   Final   Report Status 10/08/2012 FINAL   Final  CULTURE, BLOOD (ROUTINE X 2)     Status: None   Collection Time     10/02/12  2:40 PM      Result Value Range Status   Specimen Description BLOOD RIGHT ARM   Final   Special Requests BOTTLES DRAWN AEROBIC AND ANAEROBIC 10CC   Final   Culture  Setup Time 10/02/2012 21:33   Final   Culture NO GROWTH 5 DAYS   Final   Report Status 10/08/2012 FINAL   Final     Studies: No results found.  Scheduled Meds: . antiseptic oral rinse  15 mL Mouth Rinse q12n4p  . aspirin  325 mg Oral Daily  . bisoprolol  10 mg Oral Daily  . clopidogrel  75 mg Oral Daily  . docusate sodium  100 mg Oral Daily  . enoxaparin (LOVENOX) injection  40 mg Subcutaneous Q24H  . estrogens (conjugated)  0.625 mg Oral BID  . furosemide  40 mg Oral Daily  . mineral oil   Oral Q2000  .  OxyCODONE  20 mg Oral Custom  . pantoprazole  40 mg Oral Q1200  . polyethylene glycol  17 g Oral TID  . potassium chloride  40 mEq Oral BID  . senna  1 tablet Oral TID  . thyroid  150 mg Oral QAC breakfast   Continuous Infusions: . sodium chloride 75 mL/hr at 10/10/12 1800  . DOPamine      Principal Problem:   Benign hypertensive heart disease without heart failure Active Problems:   Hypothyroidism   Polycythemia vera(238.4)   Fibromyalgia   CAD (coronary artery disease)   Cholelithiases   Unspecified constipation   Septic shock   CVA (cerebral infarction)   Acute cholecystitis    Time spent: 25    Marlin Canary  [facility] Pager [phone]. If 7PM-7AM, please contact night-coverage at [email or web address], password [facility] 10/11/2012, 12:42 PM  LOS: 11 days

## 2012-10-11 NOTE — Progress Notes (Signed)
Patient being transferred to unit 2000 per MD order. Report called to [place], Charity fundraiser. Patient will transfer in wheel chair.

## 2012-10-11 NOTE — Progress Notes (Addendum)
[facility] Progress Note  10/11/2012 8:49 AM 1 Day Post-Op  Subjective:  Wants the dressing off her neck (drain) as she states she is allergic to tape.  Tm 99 now afebrile 80's-100's systolic HR 50's-70's NSR 94% [id number]  Filed Vitals:   10/11/12 0800  BP: 104/51  Pulse:   Temp: 98.2 F (36.8 C)  Resp:      Physical Exam: Neuro:  In tact Incision:  C/d/i without hematoma  CBC    Component Value Date/Time   WBC 6.8 10/11/2012 0434   WBC 4.0 09/27/2012 1303   WBC 4.9 09/20/2007 1112   RBC 3.48* 10/11/2012 0434   RBC 4.44 09/20/2007 1112   HGB 6.1* 10/11/2012 0434   HGB 5.7* 09/27/2012 1303   HGB 6.4* 09/20/2007 1112   HCT 22.8* 10/11/2012 0434   HCT 24.2* 09/27/2012 1303   HCT 23.7* 09/20/2007 1112   PLT 254 10/11/2012 0434   PLT 225 Large platelets present 09/27/2012 1303   PLT 249 09/20/2007 1112   MCV 65.5* 10/11/2012 0434   MCV 64* 09/27/2012 1303   MCV 53.4* 09/20/2007 1112   MCH 17.5* 10/11/2012 0434   MCH 15.2* 09/27/2012 1303   MCH 14.4* 09/20/2007 1112   MCHC 26.8* 10/11/2012 0434   MCHC 23.6* 09/27/2012 1303   MCHC 27.0* 09/20/2007 1112   RDW 27.8* 10/11/2012 0434   RDW 22.4* 09/27/2012 1303   RDW 21.1* 09/20/2007 1112   LYMPHSABS 0.6* 10/05/2012 1000   LYMPHSABS 0.9 09/27/2012 1303   LYMPHSABS 1.0 09/20/2007 1112   MONOABS 1.3* 10/05/2012 1000   MONOABS 0.5 09/20/2007 1112   EOSABS 0.1 10/05/2012 1000   EOSABS 0.1 09/27/2012 1303   EOSABS 0.2 09/20/2007 1112   BASOSABS 0.0 10/05/2012 1000   BASOSABS 0.0 09/27/2012 1303   BASOSABS 0.0 09/20/2007 1112    BMET    Component Value Date/Time   NA 134* 10/11/2012 0434   K 4.7 10/11/2012 0434   CL 102 10/11/2012 0434   CO2 27 10/11/2012 0434   GLUCOSE 107* 10/11/2012 0434   BUN 8 10/11/2012 0434   CREATININE 0.64 10/11/2012 0434   CALCIUM 8.4 10/11/2012 0434   GFRNONAA 89* 10/11/2012 0434   GFRAA >90 10/11/2012 0434     Intake/Output Summary (Last 24 hours) at 10/11/12 0849 Last data filed at 10/11/12 0800  Gross per 24  hour  Intake   2710 ml  Output   1460 ml  Net   1250 ml   JP drain output:  40 cc/24hr   Assessment/Plan:  This is a 67 y.o. female who is s/p left CEA 1 Day Post-Op  -pt is doing well this am from surgical standpoint. -pt neuro exam is in tact -pt has ambulated to the bedside toilet-she will need to ambulate more today -Hgb down this am-management per primary team-pt tolerating -discontinue drain -okay from vascular standpoint to discharge home.   Doreatha Massed, PA-C [facility] [phone]   Incision without hematoma neck Groin incision clean Neuro at baseline mild right upper extremity weakness Ok to d/c from out standpoint  Follow up 2-3 weeks  Fabienne Bruns, MD [facility] [place] Office: [phone] Pager: [phone]

## 2012-10-11 NOTE — Progress Notes (Signed)
Received to room 2041. Denies chest pain or shortness of breath. C/o abdominal pain 7/10. Oxy IR given x2 per order. Right abdominal biliary drain intact with green drainage noted to drain. Patient and husband c/o not having drain flushed x24hrs; will review orders and address. Evening medication given. VSS. Call bell and family near. Mamie Levers

## 2012-10-11 NOTE — Progress Notes (Addendum)
Physical Therapy Treatment Patient Details Name: DELETHA JAFFEE MRN: [id number] DOB: 12-22-1945 Today's Date: 10/11/2012 Time: 7829-5621 PT Time Calculation (min): 23 min  PT Assessment / Plan / Recommendation  PT Comments   Pt required slightly more assist today probably due to fatigue after surgery yesterday.  Recommended to pt that she use her walker at home when she is fatigued.    Follow Up Recommendations  Outpatient PT (This doesn't need to be a neuro specific PT)     Does the patient have the potential to tolerate intense rehabilitation     Barriers to Discharge        Equipment Recommendations  None recommended by PT    Recommendations for Other Services    Frequency Min 3X/week   Progress towards PT Goals Progress towards PT goals: Progressing toward goals  Plan Discharge plan needs to be updated    Precautions / Restrictions Precautions Precautions: Fall   Pertinent Vitals/Pain No c/o's    Mobility  Bed Mobility Bed Mobility: Sit to Sidelying Left Supine to Sit: 4: Min assist;HOB elevated Sitting - Scoot to Edge of Bed: 5: Supervision Sit to Sidelying Left: 5: Supervision;HOB elevated Details for Bed Mobility Assistance: Needed assist to support head due to incisional soreness of neck. Transfers Sit to Stand: With upper extremity assist;From bed;4: Min guard Stand to Sit: With upper extremity assist;To bed;4: Min guard Ambulation/Gait Ambulation/Gait Assistance: 4: Min guard;4: Min Environmental consultant (Feet): 175 Feet Assistive device: None;Rolling walker Ambulation/Gait Assistance Details: As pt fatigued she began to become less steady and required min A.  With walker pt much steadier. Gait Pattern: Step-through pattern;Decreased step length - right Modified Rankin (Stroke Patients Only) Pre-Morbid Rankin Score: No symptoms Modified Rankin: Moderately severe disability    Exercises     PT Diagnosis:    PT Problem List:   PT Treatment  Interventions:     PT Goals (current goals can now be found in the care plan section)    Visit Information  Last PT Received On: 10/11/12 Assistance Needed: +1 History of Present Illness: pt admitted with epigatric pain, N/V.  Pt found to have cholecystitis,  Perc. drain placed.  Complications from polycythemia and Hgb level may have contributed to a new watershed CVA with R side weakness  Upper > lower and slurred speech.    Subjective Data      Cognition  Cognition Arousal/Alertness: Awake/alert Behavior During Therapy: WFL for tasks assessed/performed Overall Cognitive Status: Within Functional Limits for tasks assessed    Balance  Static Standing Balance Static Standing - Balance Support: No upper extremity supported Static Standing - Level of Assistance: 5: Stand by assistance  End of Session PT - End of Session Activity Tolerance: Patient tolerated treatment well Patient left: with call bell/phone within reach;with family/visitor present Nurse Communication: Mobility status   GP     Mcgwire Dasaro 10/11/2012, 3:10 PM  [employer] PT [phone]

## 2012-10-11 NOTE — Anesthesia Postprocedure Evaluation (Addendum)
  Anesthesia Post-op Note  Patient: [name]  Procedure(s) Performed: Procedure(s): ENDARTERECTOMY CAROTID- LEFT NECK WITH GREATER SAPHENOUS VEIN PATCH LEFT LEG (Left)  Patient Location: Nursing Unit  Anesthesia Type:General  Level of Consciousness: awake, alert  and oriented  Airway and Oxygen Therapy: Patient Spontanous Breathing  Post-op Pain: none  Post-op Assessment: Post-op Vital signs reviewed, Patient's Cardiovascular Status Stable, Respiratory Function Stable, No signs of Nausea or vomiting, Adequate PO intake and Pain level controlled  Post-op Vital Signs: Reviewed and stable  Complications: No apparent anesthesia complications Pt concerned about how her intubation went due to history of difficult intubation in the 1990s- discussed with the patient her grade III view with MAC 3 followed by uncomplicated intubation with glidescope. Pt understood and was very appreciative of the information for her future surgical procedures.

## 2012-10-11 NOTE — Progress Notes (Signed)
Stroke Team Progress Note  HISTORY IVETT Hobbs is an 67 y.o. female with a history of polycythemia, hypertension and hyperlipidemia who was admitted on 09/30/2012 with acute cholecystitis. She status post drain placement. She experienced sudden onset of weakness involving her right hand as well as change in speech at around noon on 10/04/2012 in the hospital. MRI of her brain performed 10/05/2012 showed left MCA/ACA watershed infarcts. MRA showed markedly diminished flow and possible obstruction of left ICA. Patient has been taking aspirin and Plavix since admission. NIH stroke score was 2. Patient was not a TPA candidate secondary to recent drain placement and lack of recognition of acute stroke.  SUBJECTIVE Husband at the bedside. Patient feels she is doing well- she wants to go home tomorrow.  OBJECTIVE Most recent Vital Signs: Filed Vitals:   10/10/12 1931 10/11/12 0000 10/11/12 0407 10/11/12 0800  BP: 101/53  108/51 104/51  Pulse: 70 62 74   Temp: 98.6 F (37 C)  99 F (37.2 C) 98.2 F (36.8 C)  TempSrc: Oral  Oral Oral  Resp: 23 14 20    Height:      Weight:      SpO2: 93% 90% 94%    CBG (last 3)  No results found for this basename: GLUCAP,  in the last 72 hours  IV Fluid Intake:   . sodium chloride 75 mL/hr at 10/10/12 1800  . DOPamine      MEDICATIONS  . antiseptic oral rinse  15 mL Mouth Rinse q12n4p  . aspirin  325 mg Oral Daily  . bisoprolol  10 mg Oral Daily  . clopidogrel  75 mg Oral Daily  . docusate sodium  100 mg Oral Daily  . enoxaparin (LOVENOX) injection  40 mg Subcutaneous Q24H  . estrogens (conjugated)  0.625 mg Oral BID  . furosemide  40 mg Oral Daily  . mineral oil   Oral Q2000  . OxyCODONE  20 mg Oral Custom  . pantoprazole  40 mg Oral Q1200  . polyethylene glycol  17 g Oral TID  . potassium chloride  40 mEq Oral BID  . senna  1 tablet Oral TID  . thyroid  150 mg Oral QAC breakfast   PRN:  acetaminophen, albuterol-ipratropium, ALPRAZolam, alum &  mag hydroxide-simeth, cyclobenzaprine, diphenhydrAMINE, Glycerin (Adult), guaiFENesin-dextromethorphan, hydrALAZINE, labetalol, metoprolol, morphine injection, ondansetron, oxyCODONE, phenol, sodium phosphate, temazepam  Diet:  Cardiac thin liquids Activity:  Ambulate in hall DVT Prophylaxis:  Lovenox 40 mg sq daily   CLINICALLY SIGNIFICANT STUDIES Basic Metabolic Panel:   Recent Labs Lab 10/10/12 0550 10/11/12 0434  NA 136 134*  K 4.5 4.7  CL 98 102  CO2 32 27  GLUCOSE 95 107*  BUN 6 8  CREATININE 0.69 0.64  CALCIUM 8.6 8.4   Liver Function Tests:   Recent Labs Lab 10/04/12 1309  AST 13  ALT 9  ALKPHOS 46  BILITOT 0.4  PROT 5.6*  ALBUMIN 2.5*   CBC:  Recent Labs Lab 10/05/12 1000  10/10/12 0550 10/11/12 0434  WBC 11.7*  < > 5.1 6.8  NEUTROABS 9.7*  --   --   --   HGB 7.1*  < > 6.8* 6.1*  HCT 26.7*  < > 25.9* 22.8*  MCV 64.5*  < > 65.6* 65.5*  PLT 231  < > 238 254  < > = values in this interval not displayed. Coagulation:  No results found for this basename: LABPROT, INR,  in the last 168 hours Cardiac Enzymes:  Recent Labs Lab 10/04/12 1323  TROPONINI <0.30   Urinalysis:   Recent Labs Lab 10/05/12 1638  COLORURINE YELLOW  LABSPEC 1.013  PHURINE 5.0  GLUCOSEU NEGATIVE  HGBUR NEGATIVE  BILIRUBINUR NEGATIVE  KETONESUR NEGATIVE  PROTEINUR NEGATIVE  UROBILINOGEN 0.2  NITRITE NEGATIVE  LEUKOCYTESUR TRACE*   Lipid Panel    Component Value Date/Time   CHOL 58 10/07/2012 0700   TRIG 121 10/07/2012 0700   HDL 11* 10/07/2012 0700   CHOLHDL 5.3 10/07/2012 0700   VLDL 24 10/07/2012 0700   LDLCALC 23 10/07/2012 0700   HgbA1C  Lab Results  Component Value Date   HGBA1C 5.5 10/07/2012    Urine Drug Screen:   No results found for this basename: labopia,  cocainscrnur,  labbenz,  amphetmu,  thcu,  labbarb    Alcohol Level: No results found for this basename: ETH,  in the last 168 hours   CT of the brain    MRI of the brain  10/05/2012    1.  Scattered acute / subacute non hemorrhagic infarcts within a watershed territory between the left ACA and MCA distributions. 2.  Occluded or extremely slow flow within the left internal carotid artery at the skull base. 3.  Stable atrophy and minimal white matter disease otherwise. 4.  Decreased T1 marrow signal within the upper cervical spine and calvarium.  This is compatible with the patient's known anemia.   MRA of the brain  10/05/2012    1.  Markedly diminished signal within the left internal carotid artery and branch vessels.  This corresponds to the area of infarction and raises concern for mil proximal stenosis. 2.  There may be a high-grade stenosis within the cavernous segment. 3.  Prominent left PCA branch vessels.  This is likely related to attempts at collateral flow.    2D Echocardiogram  EF 65-70% with no source of embolus.   Carotid Doppler   40-59% proximal internal carotid artery stenosis with turbulent flow.. Left - There is a 60% to 79% highest end of scale ICA stenosis in the bulb with turbulent flow throughout the ICA. Bilateral - Vertebral artery flow is antegrade.  CXR  10/04/2012    Slightly better aeration.  Stable cardiomegaly.  No definite pulmonary vascular congestion.   EKG  sinus bradycardia.   Therapy Recommendations home health PT  Physical Exam General: The patient is alert and cooperative at the time of the examination. Skin: No significant peripheral edema is noted.  Neurologic Exam Cranial nerves: Facial symmetry is present. Dysarthria is noted, speech is slow, deliberate, some word finding issues. Extraocular movements are full. Visual fields are full. Motor: The patient has good strength in all 4 extremities, with exception of slight weakness of the right upper extremity. Coordination: The patient has good finger-nose-finger and heel-to-shin bilaterally. Gait and station: The gait was not tested. The patient has mild right upper extremity drift. Reflexes:  Deep tendon reflexes are symmetric.   ASSESSMENT [name] is a 67 y.o. female who developed right arm weakness and difficulty with speech while hospitalized for a gallbladder attack. 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 While polycythemia can cause stroke, given the distribution of her infarct, it is most likely due to hypoperfusion in the setting of carotid stenosis; it may have been worsened by hypotension. Carotid dopplers will confirm or refute our suspicions.  On aspirin 325 mg orally every day and  clopidogrel 75 mg orally every day prior to admission. Now on aspirin 325 mg orally every day for secondary stroke prevention. Patient with resultant right hemiparesis and expressive aphasia - which are improving significantly. Pt doing well post op.   Acute cholecystitis  Polycythemia vera Hypertension CAD  Hyperlipidemia, LDL 60, on no statin PTA, now on no statin, at goal LDL < 100   Hospital day # 11  TREATMENT/PLAN  Continue aspirin 325 mg orally every day and clopidogrel 75 mg orally every day for secondary stroke prevention as prior to admission.   OP PT -  I placed order  recommend coordination to get gallbladder surgery prior to having surgery for asx ICA (CEA) No further stroke workup indicated. Patient has a 10-15% risk of having another stroke over the next year, the highest risk is within 2 weeks of the most recent stroke/TIA (risk of having a stroke following a stroke or TIA is the same). Ongoing risk factor control by Primary Care Physician Stroke Service will sign off. Please call should any needs arise. Follow up with Dr. Pearlean Brownie, Stroke Clinic, in 2 months.  Of note, patient has been seen by a [facility] neurologist within the past year - Loyal Gambler - [phone]  Annie Main, MSN, RN, ANVP-BC, ANP-BC, GNP-BC [facility] Pager:  [phone] 10/11/2012 9:20 AM  I have personally obtained a history, examined the patient, evaluated imaging results, and formulated the assessment and plan of care. I agree with the above.   Delia Heady, MD

## 2012-10-12 ENCOUNTER — Other Ambulatory Visit: Payer: Medicare Other | Admitting: Lab

## 2012-10-12 ENCOUNTER — Ambulatory Visit: Payer: Medicare Other | Admitting: Hematology & Oncology

## 2012-10-12 ENCOUNTER — Encounter (HOSPITAL_COMMUNITY): Payer: Self-pay | Admitting: Vascular Surgery

## 2012-10-12 DIAGNOSIS — I359 Nonrheumatic aortic valve disorder, unspecified: Secondary | ICD-10-CM

## 2012-10-12 LAB — CBC
MCH: 17.5 pg — ABNORMAL LOW (ref 26.0–34.0)
MCHC: 26.2 g/dL — ABNORMAL LOW (ref 30.0–36.0)
Platelets: 283 10*3/uL (ref 150–400)
RDW: 27.9 % — ABNORMAL HIGH (ref 11.5–15.5)

## 2012-10-12 MED ORDER — FUROSEMIDE 40 MG PO TABS: 40.0000 mg | ORAL_TABLET | Freq: Every day | ORAL | Status: DC

## 2012-10-12 MED ORDER — UNABLE TO FIND: Status: DC

## 2012-10-12 MED ORDER — POTASSIUM CHLORIDE ER 10 MEQ PO TBCR: 40.0000 meq | EXTENDED_RELEASE_TABLET | Freq: Every day | ORAL | Status: DC

## 2012-10-12 MED ORDER — ONDANSETRON 4 MG PO TBDP: 4.0000 mg | ORAL_TABLET | Freq: Three times a day (TID) | ORAL | Status: DC | PRN

## 2012-10-12 NOTE — Progress Notes (Signed)
Patient ID: [name], female   DOB: Mar 13, 1946, 67 y.o.   MRN: [id number] 2 Days Post-Op  Subjective: Doing really well and wants to go home, tolerating diet well, min abd pain.  Objective: Vital signs in last 24 hours: Temp:  [97.5 F (36.4 C)-98.4 F (36.9 C)] 98.4 F (36.9 C) (07/17 0629) Pulse Rate:  [62-84] 62 (07/17 0629) Resp:  [18] 18 (07/17 0629) BP: (109-114)/(43-70) 110/69 mmHg (07/17 0629) SpO2:  [94 %-96 %] 94 % (07/17 0629) Weight:  [172 lb 13.5 oz (78.4 kg)] 172 lb 13.5 oz (78.4 kg) (07/17 0629) Last BM Date: 10/12/12  Intake/Output from previous day: 07/16 0701 - 07/17 0700 In: 860 [P.O.:720; I.V.:140] Out: 545 [Urine:250; Drains:295] Intake/Output this shift:    PE: Abd: soft, NT, ND, chole drain with bilious output.  Lab Results:   Recent Labs  10/10/12 0550 10/11/12 0434  WBC 5.1 6.8  HGB 6.8* 6.1*  HCT 25.9* 22.8*  PLT 238 254   BMET  Recent Labs  10/10/12 0550 10/11/12 0434  NA 136 134*  K 4.5 4.7  CL 98 102  CO2 32 27  GLUCOSE 95 107*  BUN 6 8  CREATININE 0.69 0.64  CALCIUM 8.6 8.4   PT/INR No results found for this basename: LABPROT, INR,  in the last 72 hours CMP     Component Value Date/Time   NA 134* 10/11/2012 0434   K 4.7 10/11/2012 0434   CL 102 10/11/2012 0434   CO2 27 10/11/2012 0434   GLUCOSE 107* 10/11/2012 0434   BUN 8 10/11/2012 0434   CREATININE 0.64 10/11/2012 0434   CALCIUM 8.4 10/11/2012 0434   PROT 5.6* 10/04/2012 1309   ALBUMIN 2.5* 10/04/2012 1309   AST 13 10/04/2012 1309   ALT 9 10/04/2012 1309   ALKPHOS 46 10/04/2012 1309   BILITOT 0.4 10/04/2012 1309   GFRNONAA 89* 10/11/2012 0434   GFRAA >90 10/11/2012 0434   Lipase     Component Value Date/Time   LIPASE 23 09/30/2012 0039       Studies/Results: No results found.  Anti-infectives: Anti-infectives   Start     Dose/Rate Route Frequency Ordered Stop   10/10/12 1600  vancomycin (VANCOCIN) IVPB 1000 mg/200 mL premix     1,000 mg 200 mL/hr over 60 Minutes  Intravenous Every 12 hours 10/10/12 1218 10/11/12 0516   10/10/12 0600  [MAR Hold]  vancomycin (VANCOCIN) 1,250 mg in sodium chloride 0.9 % 250 mL IVPB     (On MAR Hold since 10/10/12 0625)   1,250 mg 166.7 mL/hr over 90 Minutes Intravenous On call to O.R. 10/09/12 1019 10/10/12 0643   10/04/12 1300  ciprofloxacin (CIPRO) tablet 500 mg     500 mg Oral 2 times daily 10/04/12 1121 10/09/12 2021   10/01/12 0815  metroNIDAZOLE (FLAGYL) IVPB 500 mg  Status:  Discontinued     500 mg 100 mL/hr over 60 Minutes Intravenous Every 8 hours 10/01/12 0810 10/03/12 0816   09/30/12 1200  ciprofloxacin (CIPRO) IVPB 400 mg  Status:  Discontinued     400 mg 200 mL/hr over 60 Minutes Intravenous Every 12 hours 09/30/12 1057 10/04/12 1121       Assessment/Plan  1. Acute cholecystitis, s/p perc drain 2. CVA 3. PCV  Plan: 1. Ok to stop drain flushes 2. Cont drain care at home with dressing changes 3. Ok to d/c home per surgery, will follow up with Dr. Lindie Spruce in 7-10 days    LOS: 12 days  Eathen Budreau 10/12/2012, 9:23 AM

## 2012-10-12 NOTE — Progress Notes (Signed)
Ok for d/c home. D/c instructions along with medlist and scripts provided. IV d/c'd with catheter intact. Patient transported via wheelchair to lobby per Psychiatrist. Belongings with patient and husband.Mamie Levers

## 2012-10-12 NOTE — Progress Notes (Signed)
I very much appreciate all the great care that [name] has received. Her surgery went well with the carotid endarterectomy. It looks like a good amount of plaque was removed.  Should be going home today. Neurologically, it looks like she's gotten much better. Her voice deference to a stronger. Strength in the right hand appears much better.  I still think that her hemoglobin will likely continue to stabilize around 6 or so.  She is on aspirin Plavix. Should she be on a statin drug???  She is eating better. There is no problems with her gallbladder right now. She has the cholecystotomy tube in. She has been afebrile. Blood pressure is well controlled as well as her heart rate.   Today, her hemoglobin 6.1. Platelet count 254. Potassium 4.7. BUN 8 creatinine 0.64.  From a hematologic point of view. I do not see any problems with her going home today. She or he has a followup set in our office.  Her cardiac exam is regular rate and rhythm. Shows a 1/6 systolic ejection murmur. Lungs are clear. Abdomen is soft. Cholecystotomy tube is intact. Not as much tenderness in the right upper quadrant. Bowel sounds are present. Extremities shows trace edema. Neurological exam shows no focal neurological deficits.  Hopefully, she'll still be able to have her gallbladder removed in about 6-8 weeks.  We will continue to monitor her labs and her office.    Pete E.  Molli Hazard 5:7

## 2012-10-12 NOTE — Progress Notes (Signed)
No complaints  For d/c home today  Follow  Up 2-3 weeks  Fabienne Bruns, MD [facility] [place] Office: [phone] Pager: [phone]

## 2012-10-12 NOTE — Discharge Summary (Signed)
Physician Discharge Summary  Patient ID: [name] MRN: [id number] DOB/AGE: 07/12/1945 67 y.o.  Admit date: 09/30/2012 Discharge date: 10/12/2012  Primary Care Physician:  [name], MD  Discharge Diagnoses:     . Benign hypertensive heart disease without heart failure . Hypothyroidism . Polycythemia vera(238.4) . Fibromyalgia . CAD (coronary artery disease) . Cholelithiases . Unspecified constipation . CVA (cerebral infarction) . Acute cholecystitis  Consults:  General Surgery                    Interventional Radiology                    Neurology/stroke service                    Vascular surgery    Recommendations for Outpatient Follow-up:  1. Patient had declined HH, so out-patient therapy is recommended 2. Continue drain care and she will have follow-up appointment with [facility] on 7/22 3. Please follow thyroid labs out-patient  Allergies:   Allergies  Allergen Reactions  . Calcium Channel Blockers   . Codeine   . Penicillins   . Synthroid (Levothyroxine Sodium)      Discharge Medications:   Medication List    STOP taking these medications       hydrochlorothiazide 12.5 MG capsule  Commonly known as:  MICROZIDE     lisinopril 10 MG tablet  Commonly known as:  PRINIVIL,ZESTRIL      TAKE these medications       albuterol-ipratropium 18-103 MCG/ACT inhaler  Commonly known as:  COMBIVENT  Inhale 2 puffs into the lungs every 6 (six) hours as needed for wheezing.     ALPRAZolam 0.5 MG tablet  Commonly known as:  XANAX  Take 1 tablet (0.5 mg total) by mouth every 6 (six) hours as needed.     ANDROXY 10 MG tablet  Generic drug:  fluoxymesterone  Take 2.5 mg by mouth daily.     aspirin 325 MG tablet  Take 325 mg by mouth daily.     bisoprolol 10 MG tablet  Commonly known as:  ZEBETA  Take 10 mg by mouth daily.     cholecalciferol 1000 UNITS tablet  Commonly known as:  VITAMIN D  Take 3,000 Units by mouth daily.     clopidogrel 75 MG  tablet  Commonly known as:  PLAVIX  Take 75 mg by mouth daily.     cyclobenzaprine 10 MG tablet  Commonly known as:  FLEXERIL  Take 10 mg by mouth 3 (three) times daily as needed for muscle spasms.     diphenhydrAMINE 25 MG tablet  Commonly known as:  BENADRYL  Take 50 mg by mouth every 8 (eight) hours as needed.     DOCUSATE SODIUM PO  Take 1 tablet by mouth at bedtime.     famotidine 20 MG tablet  Commonly known as:  PEPCID  Take 1 tablet (20 mg total) by mouth 2 (two) times daily.     fexofenadine 180 MG tablet  Commonly known as:  ALLEGRA  Take 180 mg by mouth daily.     fish oil-omega-3 fatty acids 1000 MG capsule  Take 1 g by mouth daily.     furosemide 40 MG tablet  Commonly known as:  LASIX  Take 1 tablet (40 mg total) by mouth daily.     Garlic Oil 1000 MG Caps  Take 1 capsule by mouth daily.     lidocaine-prilocaine cream  Commonly known as:  EMLA  Apply topically as needed. Apply to phlebotomy site at least one hour prior to phlebotomy.     magnesium oxide 400 MG tablet  Commonly known as:  MAG-OX  Take 1,200 mg by mouth daily.     ondansetron 4 MG disintegrating tablet  Commonly known as:  ZOFRAN ODT  Take 1 tablet (4 mg total) by mouth every 8 (eight) hours as needed for nausea.     oxyCODONE 10 MG 12 hr tablet  Commonly known as:  OXYCONTIN  Take 1 tablet (10 mg total) by mouth every 12 (twelve) hours. Brand name medically necessary.     oxyCODONE 20 MG 12 hr tablet  Commonly known as:  OXYCONTIN  Take 1 tablet (20 mg total) by mouth every 8 (eight) hours. Brand name medically necessary.     ROXICODONE 15 MG immediate release tablet  Generic drug:  oxyCODONE  Take 1 tablet (15 mg total) by mouth every 8 (eight) hours as needed for pain. Brand name medically necessary.     potassium chloride 10 MEQ tablet  Commonly known as:  K-DUR  Take 4 tablets (40 mEq total) by mouth daily.     PREMARIN 0.625 MG tablet  Generic drug:  estrogens  (conjugated)  Take 0.625 mg by mouth 2 (two) times daily.     promethazine 25 MG tablet  Commonly known as:  PHENERGAN  Take 25 mg by mouth every 8 (eight) hours as needed for nausea.     SENNA LAX PO  Take 1 tablet by mouth at bedtime.     temazepam 30 MG capsule  Commonly known as:  RESTORIL  Take 30 mg by mouth at bedtime as needed for sleep.     thyroid 60 MG tablet  Commonly known as:  ARMOUR  Take 150 mg by mouth daily before breakfast.     UNABLE TO FIND  - Out-patient physical therapy  -   - Diagnosis: CVA, deconditioning     Valerian 100 MG Caps  Take 1 capsule by mouth daily.         Brief H and P: For complete details please refer to admission H and P, but in brief 67 yo female with complex PMH of polycythemia vera (Dr. [name]) treated with phlebotomy and Hgb between 5 and 6, fibromyalgia with chronic pain, history of stroke, CAD, HTN, presented with acute onset of epigastric pain radiating through to her back, nausea, vomiting, abdominal distention. She ate a large meal at a cookout yesterday prior to the onset of her symptoms. She was brought to the ED, where she was ruled out for any cardiac event. CTA showed no sign of ischemic events in the chest or abd/pelvis. She does have significant gallstones with increased CBD diameter (8.2 mm). Her pain is not well-controlled with IV Dilaudid, although she does take very large narcotic doses at baseline. Patient was initially admitted by [facility] and medicine was consulted.    Hospital Course:  Patient is a 68 year old female with history of polycythemia vera, fibromyalgia, history of stroke, CAD I. presented on 7/5 with epigastric abdominal pain and was found to have cholelithiasis. Patient was initially admitted by general surgery. On 77 patient developed hypotension and worsening abdominal pain and critical care medicine was also consulted. She was transferred to intensive care unit for concern of impending sepsis. Oncology  was also consulted due to her history of polycythemia vera and patient was followed by Dr. [name]. HIDA scan was positive  for cystic duct obstruction. INR was consulted and patient had a percutaneous cholecystostomy drain placement on 7/7. Patient was closely followed by interventional radiology and surgery. She will have followup appointment with Dr. Lindie Spruce on 10/17/12. Per surgery at this time she does not need any saline flushes and drain care will be taken care off by patient and her husband at home. Per surgery, drain to stay in for 6-8 weeks and then reevaluate for cholecystectomy.  New onset left CVA: On 10/04/12 during hospitalization patient experienced a sudden onset of weakness involving her right hand as well as change in speech around noon. MRI of the brain was done which showed left MCA/PCA watershed infarcts. MRA showed markedly diminished flow and possible obstruction of left ICA. Patient had been taking aspirin and Plavix. NIH stroke was 2. Neurology was consulted. Lipid panel showed LDL 23, HDL 11 hence not started on statins. Patient will continue aspirin and Plavix. Patient was not a TPA candidate secondary to recent drain placement and lack of recognition of acute stroke. 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 Vascular surgery was consulted and patient underwent CEA on 10/10/12. Patient was recommended to continue aspirin and Plavix. She will followup with Dr. Darrick Penna in 2 weeks   Chronic pain  -trying to schedule as patient takes at home   Shortness of breath: Improved, patient was weaned off O2, resumed Lasix   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 Should not get above 7 per hematology (Ennever, transfused one unit by Dr. Twanna Hy on 7/8, hemoglobin up to 6.2 ).    Hypertension: discontinued HCTZ and d/c lisinopril due to hypotension and acute stroke. Patient will follow up with PCP regarding restarting or adjusting her antihypertensives.  CAD Pt's  Cardiologist note fm 09/28/2010 She does not have a history of known ischemic heart disease. 2-D echo was done which showed EF of 65-70%, grade 1 diastolic dysfunction, no regional wall motion abnormalities.   Hypothyroidism; per patient and husband unable to take Synthroid. On Armour Thyroid 120 mg daily (patient has home meds) TSH suppressed; free t4 ok and will need to recheck as an outaptient   Constipation- added bowel regimen with enema- did not need enema      Day of Discharge BP 110/69  Pulse 62  Temp(Src) 98.4 F (36.9 C) (Oral)  Resp 18  Ht 5\' 3"  (1.6 m)  Wt 78.4 kg (172 lb 13.5 oz)  BMI 30.63 kg/m2  SpO2 94%  Physical Exam: General: Alert and awake oriented x3 not in any acute distress. CVS: S1-S2 clear no murmur rubs or gallops Chest: clear to auscultation bilaterally, no wheezing rales or rhonchi Abdomen: soft nontender, nondistended, normal bowel sounds, drain+ Extremities: no cyanosis, clubbing. trace edema noted bilaterally    The results of significant diagnostics from this hospitalization (including imaging, microbiology, ancillary and laboratory) are listed below for reference.    LAB RESULTS: Basic Metabolic Panel:  Recent Labs Lab 10/10/12 0550 10/11/12 0434  NA 136 134*  K 4.5 4.7  CL 98 102  CO2 32 27  GLUCOSE 95 107*  BUN 6 8  CREATININE 0.69 0.64  CALCIUM 8.6 8.4   Liver Function Tests: No results found for this basename: AST, ALT, ALKPHOS, BILITOT, PROT, ALBUMIN,  in the last 168 hours No results found for this basename: LIPASE, AMYLASE,  in the last 168 hours No results found for this basename: AMMONIA,  in the last 168 hours CBC:  Recent Labs Lab 10/11/12 0434 10/12/12  0810  WBC 6.8 8.1  HGB 6.1* 6.4*  HCT 22.8* 24.4*  MCV 65.5* 66.8*  PLT 254 PENDING   Cardiac Enzymes: No results found for this basename: CKTOTAL, CKMB, CKMBINDEX, TROPONINI,  in the last 168 hours BNP: No components found with this basename: POCBNP,   CBG: No results found for this basename: GLUCAP,  in the last 168 hours  Significant Diagnostic Studies:  Nm Hepatobiliary  10/01/2012   *RADIOLOGY REPORT*  Clinical Data: Gallstones, possible acute cholecystitis  NUCLEAR MEDICINE HEPATOHBILIARY INCLUDE GB  Radiopharmaceutical:  5.5 mCi 53m technetium Choletec  Comparison: CT abdomen/pelvis 09/30/2012  Findings: Normal hepatic parenchymal uptake of the radiotracer with excretion into the intrahepatic and common bile duct.  Radiotracer enters the small bowel progresses distally.  At 60 minutes, there was no radiotracer within the gallbladder lumen.  Therefore, 3 mg of morphine was administered intravenously.  Continued nonvisualization of the gallbladder at 30 minutes post morphine administration.  IMPRESSION:  Nonvisualization of the gallbladder at 60 minutes and also 30 minutes post morphine challenge.  Findings are consistent with obstruction of the cystic duct as can be seen in acute cholecystitis.  These results were called by telephone on 10/01/2012 at 3:20 p.m. to Dr. Lindie Spruce, who verbally acknowledged these results.   Original Report Authenticated By: Malachy Moan, M.D.   US Abdomen Complete  09/30/2012   *RADIOLOGY REPORT*  Clinical Data:  Mid epigastric pain.   ABDOMINAL ULTRASOUND COMPLETE  Comparison:  None.  Findings:  Gallbladder:  Multiple stones are identified within the gallbladder measuring up to 8 mm.  No gallbladder wall thickening or pericholecystic fluid.  Negative sonographic Murphy's sign.  Common Bile Duct:  The common bile duct is increased in caliber measuring up to 8.2 mm.  Liver: No focal mass lesion identified.  Within normal limits in parenchymal echogenicity.  IVC:  Appears normal.  Pancreas:  No abnormality identified.  Spleen:  Within normal limits in size and echotexture.  Right kidney:  Normal in size and parenchymal echogenicity.  No evidence of mass or hydronephrosis.  Left kidney:  Normal in size and parenchymal  echogenicity.  No evidence of mass or hydronephrosis.  Abdominal Aorta:  No aneurysm identified.  IMPRESSION:  1.  Gallstones. 2.  Increased caliber of the common bile duct which measures up to 8.2 mm. If there is clinical concern for choledocholithiasis an MRCP may be helpful.   Original Report Authenticated By: Signa Kell, M.D.   Dg Chest Port 1 View  09/30/2012   *RADIOLOGY REPORT*  Clinical Data: Mid sternal chest pain since 06:00 p.m.  PORTABLE CHEST - 1 VIEW  Comparison: 04/01/2008  Findings: The heart size and pulmonary vascularity are normal. The lungs appear clear and expanded without focal air space disease or consolidation. No blunting of the costophrenic angles.  No pneumothorax.  Mediastinal contours appear intact.  No significant change since previous study.  IMPRESSION: No evidence of active pulmonary disease.   Original Report Authenticated By: Burman Nieves, M.D.   Ct Angio Chest Aorta W/cm &/or Wo/cm  09/30/2012   *RADIOLOGY REPORT*  Clinical Data:  Chest pain radiating to the back.  Epigastric pain. Nausea and vomiting.  CT ANGIOGRAPHY CHEST, ABDOMEN AND PELVIS  Technique:  Multidetector CT imaging through the chest, abdomen and pelvis was performed using the standard protocol during bolus administration of intravenous contrast.  Multiplanar reconstructed images including MIPs were obtained and reviewed to evaluate the vascular anatomy.  Contrast: OMNIPAQUE IOHEXOL 350 MG/ML SOLN  Comparison:  None.  CTA CHEST  Findings:  Noncontrast CT images of the chest demonstrate no evidence of intramural thrombus in the thoracic aorta.  Suggestion of minimal calcification in the coronary arteries.  Contrast enhanced images demonstrate normal caliber thoracic aorta. No evidence of aneurysm.  Thoracic aorta is patent without evidence of dissection or significant thrombus.  Normal appearance of the great vessels.  Visualized pulmonary arteries appear patent without evidence of central pulmonary  embolus.  Normal heart size.  No significant lymphadenopathy in the chest. The esophagus is decompressed.  Thyroid gland appears atrophic.  No pleural effusions.  Visualization of the lungs is limited due to respiratory motion artifact but there appears to be a patchy mosaic pattern suggesting edema or emphysema.  No focal consolidation.  No pneumothorax.  Airways appear patent.  Degenerative changes in the thoracic spine with normal alignment.  No destructive bone lesions are appreciated.   Review of the MIP images confirms the above findings.  IMPRESSION: No evidence of thoracic aortic dissection.  CTA ABDOMEN AND PELVIS  Findings:  Normal caliber abdominal aorta.  No aneurysm.  The abdominal aorta, celiac axis, superior mesenteric artery, bilateral single renal arteries, the inferior mesenteric artery, and bilateral iliac, common iliac, and femoral arteries appear patent. No evidence of dissection or significant thrombus.  The gallbladder is distended with suggestion of sludge or possibly layering small stones.  No wall thickening or inflammatory infiltration is suggested.  The liver, spleen, pancreas, adrenal glands, inferior vena cava, and retroperitoneal lymph nodes are unremarkable.  Sub centimeter parenchymal cysts in both kidneys. No evidence of hydronephrosis or solid mass.  The esophagus, small bowel, and colon are mostly decompressed.  No abnormal bowel distension.  No free air or free fluid in the abdomen.  Pelvis:  The uterus appears atrophic or surgically absent.  No abnormal adnexal masses.  Rectosigmoid colon is stool-filled without evidence of diverticulitis.  The appendix is normal. No free or loculated pelvic fluid collections.  No significant pelvic lymphadenopathy.  Mild degenerative changes in the lumbar spine.  Normal alignment. No destructive bone lesions appreciated.   Review of the MIP images confirms the above findings.  IMPRESSION: No evidence of aneurysm or dissection of the abdominal  aorta.  The gallbladder is distended and contains sludge or small stones.   Original Report Authenticated By: Burman Nieves, M.D.   Ct Angio Abd/pel W/ And/or W/o  09/30/2012   *RADIOLOGY REPORT*  Clinical Data:  Chest pain radiating to the back.  Epigastric pain. Nausea and vomiting.  CT ANGIOGRAPHY CHEST, ABDOMEN AND PELVIS  Technique:  Multidetector CT imaging through the chest, abdomen and pelvis was performed using the standard protocol during bolus administration of intravenous contrast.  Multiplanar reconstructed images including MIPs were obtained and reviewed to evaluate the vascular anatomy.  Contrast: OMNIPAQUE IOHEXOL 350 MG/ML SOLN  Comparison:   None.  CTA CHEST  Findings:  Noncontrast CT images of the chest demonstrate no evidence of intramural thrombus in the thoracic aorta.  Suggestion of minimal calcification in the coronary arteries.  Contrast enhanced images demonstrate normal caliber thoracic aorta. No evidence of aneurysm.  Thoracic aorta is patent without evidence of dissection or significant thrombus.  Normal appearance of the great vessels.  Visualized pulmonary arteries appear patent without evidence of central pulmonary embolus.  Normal heart size.  No significant lymphadenopathy in the chest. The esophagus is decompressed.  Thyroid gland appears atrophic.  No pleural effusions.  Visualization of the lungs is limited due to respiratory  motion artifact but there appears to be a patchy mosaic pattern suggesting edema or emphysema.  No focal consolidation.  No pneumothorax.  Airways appear patent.  Degenerative changes in the thoracic spine with normal alignment.  No destructive bone lesions are appreciated.   Review of the MIP images confirms the above findings.  IMPRESSION: No evidence of thoracic aortic dissection.  CTA ABDOMEN AND PELVIS  Findings:  Normal caliber abdominal aorta.  No aneurysm.  The abdominal aorta, celiac axis, superior mesenteric artery, bilateral single renal  arteries, the inferior mesenteric artery, and bilateral iliac, common iliac, and femoral arteries appear patent. No evidence of dissection or significant thrombus.  The gallbladder is distended with suggestion of sludge or possibly layering small stones.  No wall thickening or inflammatory infiltration is suggested.  The liver, spleen, pancreas, adrenal glands, inferior vena cava, and retroperitoneal lymph nodes are unremarkable.  Sub centimeter parenchymal cysts in both kidneys. No evidence of hydronephrosis or solid mass.  The esophagus, small bowel, and colon are mostly decompressed.  No abnormal bowel distension.  No free air or free fluid in the abdomen.  Pelvis:  The uterus appears atrophic or surgically absent.  No abnormal adnexal masses.  Rectosigmoid colon is stool-filled without evidence of diverticulitis.  The appendix is normal. No free or loculated pelvic fluid collections.  No significant pelvic lymphadenopathy.  Mild degenerative changes in the lumbar spine.  Normal alignment. No destructive bone lesions appreciated.   Review of the MIP images confirms the above findings.  IMPRESSION: No evidence of aneurysm or dissection of the abdominal aorta.  The gallbladder is distended and contains sludge or small stones.   Original Report Authenticated By: Burman Nieves, M.D.    2D ECHO: Study Conclusions  - Left ventricle: The cavity size was normal. Wall thickness was increased in a pattern of mild LVH. Systolic function was vigorous. The estimated ejection fraction was in the range of 65% to 70%. Wall motion was normal; there were no regional wall motion abnormalities. Doppler parameters are consistent with abnormal left ventricular relaxation (grade 1 diastolic dysfunction). - Right atrium: The atrium was mildly dilated. - Pulmonary arteries: PA peak pressure: 53mm Hg (S).    Disposition and Follow-up: Discharge Orders   Future Appointments Provider Department Dept Phone   10/17/2012  9:20 AM Cherylynn Ridges, MD [facility], [place] [phone]   10/25/2012 1:00 PM Rachael Fee [facility] [phone]   10/26/2012 10:45 AM Sherren Kerns, MD [facility] [phone]   11/08/2012 1:45 PM Rachael Fee [facility] [phone]   11/08/2012 2:15 PM Josph Macho, MD [facility] [phone]   11/24/2012 9:30 AM Newt Lukes, MD [facility] [phone]   Future Orders Complete By Expires     Diet - low sodium heart healthy  As directed     Discharge instructions  As directed     Comments:      1) Out-patient physical therapy 2) Drain care: continue drain care at home with dressing changes    Increase activity slowly  As directed         DISPOSITION: Home DIET: heart Healthy diet ACTIVITY: As tolerated   DISCHARGE FOLLOW-UP Follow-up Information   Follow up with Sherren Kerns, MD In 2 weeks. (Office will call you to arrange your appt (sent))    Contact information:   [address] [facility] [address] [facility]  [place] [address] [phone]       Follow up with Gates Rigg, MD. Schedule an appointment as soon as possible for a visit in 2 months. (stroke)    Contact information:   [address] [place] [address] [phone]       Follow up with [name], MD. Schedule an appointment as soon as possible for a visit in 2 weeks. (for hospital follow-up)    Contact information:   [address] [place] [address] [phone]       Follow up with Cherylynn Ridges, MD On 10/17/2012. (Arrive at [facility] for 9:20am appt.)    Contact information:   [address]  [place] [address] [phone]       Time spent on Discharge: 45 mins  Signed:   Yazmeen Woolf M.D. [facility] 10/12/2012, 10:00 AM Pager:  [phone]

## 2012-10-12 NOTE — Discharge Instructions (Signed)
Drain does NOT need to be flushed Change dressing over drain at least once a day, wipe area around drain with alcohol and redress with dry guaze    STROKE/TIA DISCHARGE INSTRUCTIONS SMOKING Cigarette smoking nearly doubles your risk of having a stroke & is the single most alterable risk factor  If you smoke or have smoked in the last 12 months, you are advised to quit smoking for your health.  Most of the excess cardiovascular risk related to smoking disappears within a year of stopping.  Ask you doctor about anti-smoking medications   Quit Line: 1-800-QUIT NOW  Free Smoking Cessation Classes [phone])  CHOLESTEROL Know your levels; limit fat & cholesterol in your diet  Lipid Panel     Component Value Date/Time   CHOL 112 11/10/2011 1001   TRIG 128.0 11/10/2011 1001   HDL 26.70* 11/10/2011 1001   CHOLHDL 4 11/10/2011 1001   VLDL 25.6 11/10/2011 1001   LDLCALC 60 11/10/2011 1001      Many patients benefit from treatment even if their cholesterol is at goal.  Goal: Total Cholesterol (CHOL) less than 160  Goal:  Triglycerides (TRIG) less than 150  Goal:  HDL greater than 40  Goal:  LDL (LDLCALC) less than 100   BLOOD PRESSURE American Stroke Association blood pressure target is less that 120/80 mm/Hg  Your discharge blood pressure is:  BP: 130/62 mmHg  Monitor your blood pressure  Limit your salt and alcohol intake  Many individuals will require more than one medication for high blood pressure  DIABETES (A1c is a blood sugar average for last 3 months) Goal HGBA1c is under 7% (HBGA1c is blood sugar average for last 3 months)  Diabetes: {STROKE DC DIABETES:22357}    No results found for this basename: HGBA1C     Your HGBA1c can be lowered with medications, healthy diet, and exercise.  Check your blood sugar as directed by your physician  Call your physician if you experience unexplained or low blood sugars.  PHYSICAL ACTIVITY/REHABILITATION Goal is 30 minutes at  least 4 days per week    {STROKE DC ACTIVITY/REHAB:22359}  Activity decreases your risk of heart attack and stroke and makes your heart stronger.  It helps control your weight and blood pressure; helps you relax and can improve your mood.  Participate in a regular exercise program.  Talk with your doctor about the best form of exercise for you (dancing, walking, swimming, cycling).  DIET/WEIGHT Goal is to maintain a healthy weight  Your discharge diet is: Fat Restricted *** liquids Your height is:    Your current weight is:   Your Body Mass Index (BMI) is:     Following the type of diet specifically designed for you will help prevent another stroke.  Your goal weight range is:    Your goal Body Mass Index (BMI) is 19-24.  Healthy food habits can help reduce 3 risk factors for stroke:  High cholesterol, hypertension, and excess weight.  RESOURCES Stroke/Support Group:  Call [phone]  they meet the 3rd Sunday of the month on the Rehab Unit at [facility], [place] ( no meetings June, July & Aug).  STROKE EDUCATION PROVIDED/REVIEWED AND GIVEN TO PATIENT Stroke warning signs and symptoms How to activate emergency medical system (call 911). Medications prescribed at discharge. Need for follow-up after discharge. Personal risk factors for stroke. Pneumonia vaccine given:   {STROKE DC YES/NO/DATE:22363} Flu vaccine given:   {STROKE DC YES/NO/DATE:22363} My questions have been answered, the writing is legible, and  I understand these instructions.  I will adhere to these goals & educational materials that have been provided to me after my discharge from the hospital.

## 2012-10-13 ENCOUNTER — Telehealth: Payer: Self-pay | Admitting: Neurology

## 2012-10-14 ENCOUNTER — Emergency Department (HOSPITAL_COMMUNITY): Payer: Medicare Other

## 2012-10-14 ENCOUNTER — Encounter (HOSPITAL_COMMUNITY): Payer: Self-pay | Admitting: *Deleted

## 2012-10-14 ENCOUNTER — Inpatient Hospital Stay (HOSPITAL_COMMUNITY)
Admission: EM | Admit: 2012-10-14 | Discharge: 2012-10-19 | DRG: 065 | Disposition: A | Discharge status: Home Health | Payer: Medicare Other | Attending: Internal Medicine | Admitting: Internal Medicine

## 2012-10-14 ENCOUNTER — Other Ambulatory Visit: Payer: Self-pay | Admitting: Cardiology

## 2012-10-14 DIAGNOSIS — I2510 Atherosclerotic heart disease of native coronary artery without angina pectoris: Secondary | ICD-10-CM | POA: Diagnosis present

## 2012-10-14 DIAGNOSIS — E785 Hyperlipidemia, unspecified: Secondary | ICD-10-CM | POA: Diagnosis present

## 2012-10-14 DIAGNOSIS — Z79899 Other long term (current) drug therapy: Secondary | ICD-10-CM

## 2012-10-14 DIAGNOSIS — M797 Fibromyalgia: Secondary | ICD-10-CM

## 2012-10-14 DIAGNOSIS — K810 Acute cholecystitis: Secondary | ICD-10-CM | POA: Diagnosis present

## 2012-10-14 DIAGNOSIS — I119 Hypertensive heart disease without heart failure: Secondary | ICD-10-CM

## 2012-10-14 DIAGNOSIS — R569 Unspecified convulsions: Secondary | ICD-10-CM

## 2012-10-14 DIAGNOSIS — E038 Other specified hypothyroidism: Secondary | ICD-10-CM | POA: Diagnosis present

## 2012-10-14 DIAGNOSIS — G40209 Localization-related (focal) (partial) symptomatic epilepsy and epileptic syndromes with complex partial seizures, not intractable, without status epilepticus: Secondary | ICD-10-CM | POA: Diagnosis present

## 2012-10-14 DIAGNOSIS — E039 Hypothyroidism, unspecified: Secondary | ICD-10-CM

## 2012-10-14 DIAGNOSIS — J40 Bronchitis, not specified as acute or chronic: Secondary | ICD-10-CM

## 2012-10-14 DIAGNOSIS — K8020 Calculus of gallbladder without cholecystitis without obstruction: Secondary | ICD-10-CM

## 2012-10-14 DIAGNOSIS — Z7902 Long term (current) use of antithrombotics/antiplatelets: Secondary | ICD-10-CM

## 2012-10-14 DIAGNOSIS — D45 Polycythemia vera: Secondary | ICD-10-CM | POA: Diagnosis present

## 2012-10-14 DIAGNOSIS — IMO0001 Reserved for inherently not codable concepts without codable children: Secondary | ICD-10-CM

## 2012-10-14 DIAGNOSIS — K5900 Constipation, unspecified: Secondary | ICD-10-CM | POA: Diagnosis present

## 2012-10-14 DIAGNOSIS — Z7982 Long term (current) use of aspirin: Secondary | ICD-10-CM

## 2012-10-14 DIAGNOSIS — I639 Cerebral infarction, unspecified: Secondary | ICD-10-CM | POA: Diagnosis present

## 2012-10-14 DIAGNOSIS — Z8673 Personal history of transient ischemic attack (TIA), and cerebral infarction without residual deficits: Secondary | ICD-10-CM

## 2012-10-14 DIAGNOSIS — R6521 Severe sepsis with septic shock: Secondary | ICD-10-CM

## 2012-10-14 DIAGNOSIS — D649 Anemia, unspecified: Secondary | ICD-10-CM | POA: Diagnosis present

## 2012-10-14 DIAGNOSIS — Z780 Asymptomatic menopausal state: Secondary | ICD-10-CM

## 2012-10-14 DIAGNOSIS — I10 Essential (primary) hypertension: Secondary | ICD-10-CM | POA: Diagnosis present

## 2012-10-14 DIAGNOSIS — I6350 Cerebral infarction due to unspecified occlusion or stenosis of unspecified cerebral artery: Principal | ICD-10-CM | POA: Diagnosis present

## 2012-10-14 DIAGNOSIS — I358 Other nonrheumatic aortic valve disorders: Secondary | ICD-10-CM

## 2012-10-14 LAB — COMPREHENSIVE METABOLIC PANEL
ALT: 20 U/L (ref 0–35)
Albumin: 3.2 g/dL — ABNORMAL LOW (ref 3.5–5.2)
Alkaline Phosphatase: 101 U/L (ref 39–117)
Calcium: 9 mg/dL (ref 8.4–10.5)
Potassium: 4.4 mEq/L (ref 3.5–5.1)
Sodium: 134 mEq/L — ABNORMAL LOW (ref 135–145)
Total Protein: 7 g/dL (ref 6.0–8.3)

## 2012-10-14 LAB — POCT I-STAT, CHEM 8
BUN: 8 mg/dL (ref 6–23)
Chloride: 101 mEq/L (ref 96–112)
Creatinine, Ser: 0.8 mg/dL (ref 0.50–1.10)
Glucose, Bld: 98 mg/dL (ref 70–99)
Potassium: 4.6 mEq/L (ref 3.5–5.1)

## 2012-10-14 LAB — CBC WITH DIFFERENTIAL/PLATELET
Basophils Absolute: 0 10*3/uL (ref 0.0–0.1)
Basophils Relative: 0 % (ref 0–1)
Eosinophils Absolute: 0.1 10*3/uL (ref 0.0–0.7)
HCT: 29 % — ABNORMAL LOW (ref 36.0–46.0)
Hemoglobin: 7.7 g/dL — ABNORMAL LOW (ref 12.0–15.0)
Lymphocytes Relative: 12 % (ref 12–46)
Lymphs Abs: 0.8 10*3/uL (ref 0.7–4.0)
MCHC: 26.6 g/dL — ABNORMAL LOW (ref 30.0–36.0)
MCV: 66.5 fL — ABNORMAL LOW (ref 78.0–100.0)
Monocytes Relative: 9 % (ref 3–12)
Neutro Abs: 4.9 10*3/uL (ref 1.7–7.7)
RDW: 27.6 % — ABNORMAL HIGH (ref 11.5–15.5)

## 2012-10-14 LAB — PROTIME-INR: Prothrombin Time: 13.9 seconds (ref 11.6–15.2)

## 2012-10-14 LAB — GLUCOSE, CAPILLARY

## 2012-10-14 MED ORDER — SODIUM CHLORIDE 0.9 % IV SOLN
1000.0000 mg | Freq: Once | INTRAVENOUS | Status: AC
  Administered 2012-10-14: 1000 mg via INTRAVENOUS
  Filled 2012-10-14: qty 10

## 2012-10-14 MED ORDER — LORAZEPAM 2 MG/ML IJ SOLN
INTRAMUSCULAR | Status: AC
  Filled 2012-10-14: qty 1

## 2012-10-14 MED ORDER — LORAZEPAM 2 MG/ML IJ SOLN
INTRAMUSCULAR | Status: AC
  Administered 2012-10-14: 2 mg
  Filled 2012-10-14: qty 1

## 2012-10-14 MED ORDER — LORAZEPAM 2 MG/ML IJ SOLN
1.0000 mg | Freq: Once | INTRAMUSCULAR | Status: AC
  Administered 2012-10-14: 1 mg via INTRAVENOUS

## 2012-10-14 MED ORDER — LORAZEPAM 2 MG/ML IJ SOLN
1.0000 mg | Freq: Once | INTRAMUSCULAR | Status: AC
  Administered 2012-10-14: 1 mg via INTRAMUSCULAR

## 2012-10-14 MED ORDER — LORAZEPAM 2 MG/ML IJ SOLN: 1.0000 mg | Freq: Once | INTRAMUSCULAR | Status: DC

## 2012-10-14 NOTE — Consult Note (Signed)
Reason for Consult:Seizure activity Referring Physician: Anitra Lauth  CC: Seizures  HPI: [name] is an 67 y.o. female recently discharged after being diagnosed with left MCA/PCA watershed infarcts, s/p left CEA on 10/10/2012.  At about 630pm noted new onset seizure activity.  Described as head turning to the right with clonic acitivity of her right face and right upper extremity.  Patient has had 7 since their onset and while interviewing had another that was witnessed.  Has received 2mg  of Ativan prior to may evaluation.   Patient reports that after her recent stroke she only had minimal right upper extremity weakness and no facial droop.    Past Medical History  Diagnosis Date  . 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 Fibromyalgia   . Hypothyroidism   . Essential hypertension   . Postmenopausal state     on hormone replacement therapy  . Aortic valve sclerosis     by echocardiogram 12/04/2009  . PAC (premature atrial contraction)     history of  . Coronary artery disease   . Hyperlipidemia   . History of echocardiogram 12/04/2009    showed mild ventricle hypertrophy and normal EF at 55-60% -- mild aortic valve sclerosis  -- trace mitral regurgitation -- slightly pulmonary artery pressure at 47  --  [name], M.D.   . Fatigue     Past Surgical History  Procedure Laterality Date  . Endarterectomy Left 10/10/2012    Procedure: ENDARTERECTOMY CAROTID- LEFT NECK WITH GREATER SAPHENOUS VEIN PATCH LEFT LEG;  Surgeon: Sherren Kerns, MD;  Location: [facility] OR;  Service: Vascular;  Laterality: Left;    Family History  Problem Relation Age of Onset  . Heart attack Father   . Coronary artery disease Mother     had aortic valve replacement    Social History:  reports that she has never smoked. She does not have any smokeless tobacco history on file. She reports that she does not drink alcohol or use illicit drugs.  Allergies  Allergen Reactions  . Calcium  Channel Blockers   . Codeine   . Penicillins   . Synthroid (Levothyroxine Sodium)     Medications: I have reviewed the patient's current medications. Prior to Admission:  Current outpatient prescriptions:albuterol-ipratropium (COMBIVENT) 18-103 MCG/ACT inhaler, Inhale 2 puffs into the lungs every 6 (six) hours as needed for wheezing., Disp: , Rfl: ;  ALPRAZolam (XANAX) 0.5 MG tablet, Take 1 tablet (0.5 mg total) by mouth every 6 (six) hours as needed., Disp: 120 tablet, Rfl: 5;  aspirin 325 MG tablet, Take 325 mg by mouth daily.  , Disp: , Rfl:  bisoprolol (ZEBETA) 10 MG tablet, Take 10 mg by mouth daily., Disp: , Rfl: ;  cholecalciferol (VITAMIN D) 1000 UNITS tablet, Take 3,000 Units by mouth daily. , Disp: , Rfl: ;  clopidogrel (PLAVIX) 75 MG tablet, Take 75 mg by mouth daily., Disp: , Rfl: ;  cyclobenzaprine (FLEXERIL) 10 MG tablet, Take 10 mg by mouth 3 (three) times daily as needed for muscle spasms. , Disp: , Rfl:  diphenhydrAMINE (BENADRYL) 25 MG tablet, Take 50 mg by mouth every 8 (eight) hours as needed. , Disp: , Rfl: ;  DOCUSATE SODIUM PO, Take 1 tablet by mouth at bedtime.  , Disp: , Rfl: ;  estrogens, conjugated, (PREMARIN) 0.625 MG tablet, Take 0.625 mg by mouth 2 (two) times daily.  , Disp: , Rfl: ;  famotidine (PEPCID) 20 MG tablet, Take 1 tablet (20 mg total) by  mouth 2 (two) times daily., Disp: 60 tablet, Rfl: 11 fexofenadine (ALLEGRA) 180 MG tablet, Take 180 mg by mouth daily.  , Disp: , Rfl: ;  fish oil-omega-3 fatty acids 1000 MG capsule, Take 1 g by mouth daily.  , Disp: , Rfl: ;  fluoxymesterone (ANDROXY) 10 MG tablet, Take 2.5 mg by mouth daily. , Disp: , Rfl: ;  furosemide (LASIX) 40 MG tablet, Take 1 tablet (40 mg total) by mouth daily., Disp: 60 tablet, Rfl: 11;  Garlic Oil 1000 MG CAPS, Take 1 capsule by mouth daily. , Disp: , Rfl:  lidocaine-prilocaine (EMLA) cream, Apply topically as needed. Apply to phlebotomy site at least one hour prior to phlebotomy., Disp: 30 g, Rfl: 0;   magnesium oxide (MAG-OX) 400 MG tablet, Take 1,200 mg by mouth daily.  , Disp: , Rfl: ;  ondansetron (ZOFRAN ODT) 4 MG disintegrating tablet, Take 1 tablet (4 mg total) by mouth every 8 (eight) hours as needed for nausea., Disp: 30 tablet, Rfl: 1 oxyCODONE (OXYCONTIN) 10 MG 12 hr tablet, Take 1 tablet (10 mg total) by mouth every 12 (twelve) hours. Brand name medically necessary., Disp: 60 tablet, Rfl: 0;  oxyCODONE (OXYCONTIN) 20 MG 12 hr tablet, Take 1 tablet (20 mg total) by mouth every 8 (eight) hours. Brand name medically necessary., Disp: 90 tablet, Rfl: 0;  potassium chloride (K-DUR) 10 MEQ tablet, Take 4 tablets (40 mEq total) by mouth daily., Disp: , Rfl:  promethazine (PHENERGAN) 25 MG tablet, Take 25 mg by mouth every 8 (eight) hours as needed for nausea. , Disp: , Rfl: ;  ROXICODONE 15 MG immediate release tablet, Take 1 tablet (15 mg total) by mouth every 8 (eight) hours as needed for pain. Brand name medically necessary., Disp: 90 tablet, Rfl: 0;  Sennosides (SENNA LAX PO), Take 1 tablet by mouth at bedtime.  , Disp: , Rfl:  temazepam (RESTORIL) 30 MG capsule, Take 30 mg by mouth at bedtime as needed for sleep., Disp: , Rfl: ;  thyroid (ARMOUR) 60 MG tablet, Take 150 mg by mouth daily before breakfast., Disp: , Rfl: ;  UNABLE TO FIND, Out-patient physical therapy  Diagnosis: CVA, deconditioning, Disp: 1 Mutually Defined, Rfl: 0;  Valerian 100 MG CAPS, Take 1 capsule by mouth daily. , Disp: , Rfl:   ROS: History obtained from the patient  General ROS: negative for - chills, fatigue, fever, night sweats, weight gain or weight loss Psychological ROS: negative for - behavioral disorder, hallucinations, memory difficulties, mood swings or suicidal ideation Ophthalmic ROS: negative for - blurry vision, double vision, eye pain or loss of vision ENT ROS: negative for - epistaxis, nasal discharge, oral lesions, sore throat, tinnitus or vertigo Allergy and Immunology ROS: negative for - hives or  itchy/watery eyes Hematological and Lymphatic ROS: negative for - bleeding problems, bruising or swollen lymph nodes Endocrine ROS: negative for - galactorrhea, hair pattern changes, polydipsia/polyuria or temperature intolerance Respiratory ROS: negative for - cough, hemoptysis, shortness of breath or wheezing Cardiovascular ROS: negative for - chest pain, dyspnea on exertion, edema or irregular heartbeat Gastrointestinal ROS: negative for - abdominal pain, diarrhea, hematemesis, nausea/vomiting or stool incontinence Genito-Urinary ROS: negative for - dysuria, hematuria, incontinence or urinary frequency/urgency Musculoskeletal ROS: RUE pain Neurological ROS: as noted in HPI Dermatological ROS: negative for rash and skin lesion changes  Physical Examination: Blood pressure 141/74, pulse 84, temperature 97.7 F (36.5 C), temperature source Oral, resp. rate 30, SpO2 100.00%.  Neurologic Examination Mental Status: Alert, oriented, thought content  appropriate.  Some word finding difficulties noted at times.  Able to follow 3 step commands without difficulty. Cranial Nerves: II: Discs flat bilaterally; Visual fields grossly normal, pupils equal, round, reactive to light and accommodation III,IV, VI: ptosis not present, extra-ocular motions intact bilaterally V,VII: right facial droop, facial light touch sensation normal bilaterally VIII: hearing normal bilaterally IX,X: gag reflex present XI: decreased right shoulder shrug XII: midline tongue extension Motor: Right : Upper extremity   0/5    Left:     Upper extremity   5/5  Lower extremity   5-/5     Lower extremity   5/5 Tone and bulk:normal tone throughout; no atrophy noted Sensory: Pinprick and light touch intact throughout, bilaterally Deep Tendon Reflexes: 2+ in the upper extremities and absent in the lower extremities Plantars: Right: upgoing   Left: downgoing Cerebellar: normal finger-to-nose on the left Gait: unable to  test CV: pulses palpable throughout     Laboratory Studies:   Basic Metabolic Panel:  Recent Labs Lab 10/09/12 0500 10/10/12 0550 10/11/12 0434 10/14/12 2201  NA 134* 136 134* 138  K 4.6 4.5 4.7 4.6  CL 98 98 102 101  CO2 31 32 27  --   GLUCOSE 92 95 107* 98  BUN 7 6 8 8   CREATININE 0.71 0.69 0.64 0.80  CALCIUM 8.9 8.6 8.4  --     Liver Function Tests: No results found for this basename: AST, ALT, ALKPHOS, BILITOT, PROT, ALBUMIN,  in the last 168 hours No results found for this basename: LIPASE, AMYLASE,  in the last 168 hours No results found for this basename: AMMONIA,  in the last 168 hours  CBC:  Recent Labs Lab 10/09/12 0500 10/10/12 0550 10/11/12 0434 10/12/12 0810 10/14/12 2103 10/14/12 2201  WBC 6.4 5.1 6.8 8.1 6.4  --   NEUTROABS  --   --   --   --  PENDING  --   HGB 6.5* 6.8* 6.1* 6.4* 7.7* 9.5*  HCT 24.9* 25.9* 22.8* 24.4* 29.0* 28.0*  MCV 65.7* 65.6* 65.5* 66.8* 66.5*  --   PLT 227 238 254 283 390  --     Cardiac Enzymes: No results found for this basename: CKTOTAL, CKMB, CKMBINDEX, TROPONINI,  in the last 168 hours  BNP: No components found with this basename: POCBNP,   CBG:  Recent Labs Lab 10/14/12 2145  GLUCAP 101*    Microbiology: Results for orders placed during the hospital encounter of 09/30/12  URINE CULTURE     Status: None   Collection Time    10/01/12  9:04 PM      Result Value Range Status   Specimen Description URINE, CLEAN CATCH   Final   Special Requests NONE   Final   Culture  Setup Time 10/02/2012 02:48   Final   Colony Count NO GROWTH   Final   Culture NO GROWTH   Final   Report Status 10/03/2012 FINAL   Final  SURGICAL PCR SCREEN     Status: None   Collection Time    10/01/12 11:28 PM      Result Value Range Status   MRSA, PCR NEGATIVE  NEGATIVE Final   Staphylococcus aureus NEGATIVE  NEGATIVE Final   Comment:            The Xpert SA Assay (FDA     approved for NASAL specimens     in patients over 43  years of age),     is one component of  a comprehensive surveillance     program.  Test performance has     been validated by [facility] for patients greater     than or equal to 86 year old.     It is not intended     to diagnose infection nor to     guide or monitor treatment.  CULTURE, ROUTINE-ABSCESS     Status: None   Collection Time    10/02/12  1:55 PM      Result Value Range Status   Specimen Description ABSCESS GALL BLADDER   Final   Special Requests NONE   Final   Gram Stain     Final   Value: NO WBC SEEN     NO SQUAMOUS EPITHELIAL CELLS SEEN     MODERATE GRAM POSITIVE RODS     MODERATE GRAM POSITIVE COCCI     IN PAIRS FEW GRAM NEGATIVE RODS   Culture     Final   Value: MULTIPLE ORGANISMS PRESENT, NONE PREDOMINANT     Note: NO STAPHYLOCOCCUS AUREUS ISOLATED NO GROUP A STREP (S.PYOGENES) ISOLATED   Report Status 10/05/2012 FINAL   Final  ANAEROBIC CULTURE     Status: None   Collection Time    10/02/12  1:55 PM      Result Value Range Status   Specimen Description ABSCESS GALL BLADDER   Final   Special Requests NONE   Final   Gram Stain     Final   Value: NO WBC SEEN     NO SQUAMOUS EPITHELIAL CELLS SEEN     MODERATE GRAM POSITIVE RODS     MODERATE GRAM POSITIVE COCCI     IN PAIRS FEW GRAM NEGATIVE RODS   Culture NO ANAEROBES ISOLATED   Final   Report Status 10/07/2012 FINAL   Final  MRSA PCR SCREENING     Status: None   Collection Time    10/02/12  2:09 PM      Result Value Range Status   MRSA by PCR NEGATIVE  NEGATIVE Final   Comment:            The GeneXpert MRSA Assay (FDA     approved for NASAL specimens     only), is one component of a     comprehensive MRSA colonization     surveillance program. It is not     intended to diagnose MRSA     infection nor to guide or     monitor treatment for     MRSA infections.  CULTURE, BLOOD (ROUTINE X 2)     Status: None   Collection Time    10/02/12  2:30 PM      Result Value Range Status   Specimen  Description BLOOD RIGHT ARM   Final   Special Requests BOTTLES DRAWN AEROBIC AND ANAEROBIC 10CC   Final   Culture  Setup Time 10/02/2012 21:33   Final   Culture NO GROWTH 5 DAYS   Final   Report Status 10/08/2012 FINAL   Final  CULTURE, BLOOD (ROUTINE X 2)     Status: None   Collection Time    10/02/12  2:40 PM      Result Value Range Status   Specimen Description BLOOD RIGHT ARM   Final   Special Requests BOTTLES DRAWN AEROBIC AND ANAEROBIC 10CC   Final   Culture  Setup Time 10/02/2012 21:33   Final   Culture NO GROWTH 5 DAYS   Final  Report Status 10/08/2012 FINAL   Final    Coagulation Studies:  Recent Labs  10/14/12 2103  LABPROT 13.9  INR 1.09    Urinalysis: No results found for this basename: COLORURINE, APPERANCEUR, LABSPEC, PHURINE, GLUCOSEU, HGBUR, BILIRUBINUR, KETONESUR, PROTEINUR, UROBILINOGEN, NITRITE, LEUKOCYTESUR,  in the last 168 hours  Lipid Panel:     Component Value Date/Time   CHOL 58 10/07/2012 0700   TRIG 121 10/07/2012 0700   HDL 11* 10/07/2012 0700   CHOLHDL 5.3 10/07/2012 0700   VLDL 24 10/07/2012 0700   LDLCALC 23 10/07/2012 0700    HgbA1C:  Lab Results  Component Value Date   HGBA1C 5.5 10/07/2012    Urine Drug Screen:   No results found for this basename: labopia,  cocainscrnur,  labbenz,  amphetmu,  thcu,  labbarb    Alcohol Level: No results found for this basename: ETH,  in the last 168 hours  Imaging: No results found.   Assessment/Plan: 67 year old female with new onset seizures.  On examination with increased right sided weakness.  Increased weakness may be a post-ictal phenomenon but can not rule out the possibility of a new ischemic event.  Seizures continue frequently and start as simple partial seizures but with continued activity seizures become complex partial.    Recommendations: 1.  Ativan 4mg  IV now 2.  Head CT without contrast stat 3.  Keppra 1000mg  IV now with maintenance of 500mg  IV q 12 hours 4.  Seizure  precautions 5.  If CT shows nothing acute, patient will require MRI of the brain     Thana Farr, MD [facility] [phone] 10/14/2012, 10:16 PM

## 2012-10-14 NOTE — ED Notes (Signed)
Dr Effie Shy saw pt in triage and pt had seizure.

## 2012-10-14 NOTE — ED Notes (Signed)
Symptoms began at 1830.

## 2012-10-14 NOTE — ED Provider Notes (Signed)
History    CSN: [id number] Arrival date & time 10/14/12  1948  First MD Initiated Contact with Patient 10/14/12 2037     Chief Complaint  Patient presents with  . 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 Also s/p carotid endarterectomy on 715 - had been recovering well. Today at 1830 acute onset RUE throbbing pain and cramping. Subsequently spread up right arm to neck and face and began to develop RUE and right facial myoclonic seizure activity, no LOC, not post ictal, no loss of bladder dysfunction. Husband admits to 7 episodes since onset w/ longest lasting >15 minutes. Sx a/w HA. Pt admits to fever this am - on cipro  Chronically for infection that husband is unsure of. Pt is afebrile in ED. No recent head trauma.   Patient is a 67 y.o. female presenting with seizures. The history is provided by the patient and a relative. The history is limited by the condition of the patient.  Seizures Seizure activity on arrival: yes   Seizure type:  Partial simple Preceding symptoms comment:  Arm pain Initial focality:  Right-sided Episode characteristics: abnormal movements and focal shaking   Postictal symptoms: no confusion, no memory loss and no somnolence   Return to baseline: yes   Severity:  Severe Timing:  Intermittent Number of seizures this episode:  7 Progression:  Worsening  Past Medical History  Diagnosis Date  . 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 Fibromyalgia   . Hypothyroidism   . Essential hypertension   . Postmenopausal state     on hormone replacement therapy  . Aortic valve sclerosis     by echocardiogram 12/04/2009  . PAC (premature atrial contraction)     history of  . Coronary artery disease   . Hyperlipidemia   . History of echocardiogram  12/04/2009    showed mild ventricle hypertrophy and normal EF at 55-60% -- mild aortic valve sclerosis  -- trace mitral regurgitation -- slightly pulmonary artery pressure at 47  --  [name], M.D.   . Fatigue    Past Surgical History  Procedure Laterality Date  . Endarterectomy Left 10/10/2012    Procedure: ENDARTERECTOMY CAROTID- LEFT NECK WITH GREATER SAPHENOUS VEIN PATCH LEFT LEG;  Surgeon: Sherren Kerns, MD;  Location: [facility] OR;  Service: Vascular;  Laterality: Left;   Family History  Problem Relation Age of Onset  . Heart attack Father   . Coronary artery disease Mother     had aortic valve replacement   History  Substance Use Topics  . Smoking status: Never Smoker   . Smokeless tobacco: Not on file  . Alcohol Use: No   OB History   Grav Para Term Preterm Abortions TAB SAB Ect Mult Living                 Review of Systems  Constitutional: Negative for fever and chills.  HENT: Negative for congestion and sore throat.   Respiratory: Negative for cough and shortness of breath.   Cardiovascular: Negative for chest pain and leg swelling.  Gastrointestinal: Negative for nausea, vomiting, abdominal pain, diarrhea and constipation.  Genitourinary: Negative for dysuria and frequency.  Skin: Negative for color change and rash.  Neurological: Positive for seizures. Negative for dizziness and headaches.  Psychiatric/Behavioral: Negative for confusion and agitation.  All other systems reviewed and are negative.  Allergies  Calcium channel blockers; Codeine; Penicillins; and Synthroid  Home Medications   Current Outpatient Rx  Name  Route  Sig  Dispense  Refill  . albuterol-ipratropium (COMBIVENT) 18-103 MCG/ACT inhaler   Inhalation   Inhale 2 puffs into the lungs every 6 (six) hours as needed for wheezing.         Marland Kitchen ALPRAZolam (XANAX) 0.5 MG tablet   Oral   Take 1 tablet (0.5 mg total) by mouth every 6 (six) hours as needed.   120 tablet   5   . aspirin  325 MG tablet   Oral   Take 325 mg by mouth daily.           . bisoprolol (ZEBETA) 10 MG tablet   Oral   Take 10 mg by mouth daily.         . cholecalciferol (VITAMIN D) 1000 UNITS tablet   Oral   Take 3,000 Units by mouth daily.          . clopidogrel (PLAVIX) 75 MG tablet   Oral   Take 75 mg by mouth daily.         . cyclobenzaprine (FLEXERIL) 10 MG tablet   Oral   Take 10 mg by mouth 3 (three) times daily as needed for muscle spasms.          . diphenhydrAMINE (BENADRYL) 25 MG tablet   Oral   Take 50 mg by mouth every 8 (eight) hours as needed.          Marland Kitchen DOCUSATE SODIUM PO   Oral   Take 1 tablet by mouth at bedtime.           Marland Kitchen estrogens, conjugated, (PREMARIN) 0.625 MG tablet   Oral   Take 0.625 mg by mouth 2 (two) times daily.           . famotidine (PEPCID) 20 MG tablet   Oral   Take 1 tablet (20 mg total) by mouth 2 (two) times daily.   60 tablet   11   . fexofenadine (ALLEGRA) 180 MG tablet   Oral   Take 180 mg by mouth daily.           . fish oil-omega-3 fatty acids 1000 MG capsule   Oral   Take 1 g by mouth daily.           . fluoxymesterone (ANDROXY) 10 MG tablet   Oral   Take 2.5 mg by mouth daily.          . furosemide (LASIX) 40 MG tablet   Oral   Take 1 tablet (40 mg total) by mouth daily.   60 tablet   11   . Garlic Oil 1000 MG CAPS   Oral   Take 1 capsule by mouth daily.          Marland Kitchen lidocaine-prilocaine (EMLA) cream   Topical   Apply topically as needed. Apply to phlebotomy site at least one hour prior to phlebotomy.   30 g   0   . magnesium oxide (MAG-OX) 400 MG tablet   Oral   Take 1,200 mg by mouth daily.           . ondansetron (ZOFRAN ODT) 4 MG disintegrating tablet   Oral   Take 1 tablet (4 mg total) by mouth every 8 (eight) hours as needed for nausea.   30 tablet   1   . oxyCODONE (OXYCONTIN) 10 MG 12 hr tablet   Oral  Take 1 tablet (10 mg total) by mouth every 12 (twelve) hours. Brand  name medically necessary.   60 tablet   0     Dispense as written.   Marland Kitchen oxyCODONE (OXYCONTIN) 20 MG 12 hr tablet   Oral   Take 1 tablet (20 mg total) by mouth every 8 (eight) hours. Brand name medically necessary.   90 tablet   0     Dispense as written.   . potassium chloride (K-DUR) 10 MEQ tablet   Oral   Take 4 tablets (40 mEq total) by mouth daily.         . promethazine (PHENERGAN) 25 MG tablet   Oral   Take 25 mg by mouth every 8 (eight) hours as needed for nausea.          Marland Kitchen ROXICODONE 15 MG immediate release tablet   Oral   Take 1 tablet (15 mg total) by mouth every 8 (eight) hours as needed for pain. Brand name medically necessary.   90 tablet   0     Dispense as written.   . Sennosides (SENNA LAX PO)   Oral   Take 1 tablet by mouth at bedtime.           . temazepam (RESTORIL) 30 MG capsule   Oral   Take 30 mg by mouth at bedtime as needed for sleep.         Marland Kitchen thyroid (ARMOUR) 60 MG tablet   Oral   Take 150 mg by mouth daily before breakfast.         . UNABLE TO FIND      Out-patient physical therapy  Diagnosis: CVA, deconditioning   1 Mutually Defined   0   . Valerian 100 MG CAPS   Oral   Take 1 capsule by mouth daily.           BP 139/70  Pulse 70  Temp(Src) 97.7 F (36.5 C) (Oral)  Resp 25  SpO2 100% Physical Exam  Vitals reviewed. Constitutional: She appears well-developed and well-nourished. No distress.  HENT:  Head: Normocephalic and atraumatic.  Eyes: EOM are normal. Pupils are equal, round, and reactive to light.  Neck: Normal range of motion. Neck supple.  Cardiovascular: Normal rate and regular rhythm.   Pulmonary/Chest: Effort normal and breath sounds normal. No respiratory distress.  Abdominal: Soft. She exhibits no distension.  Musculoskeletal: Normal range of motion. She exhibits no edema.  Neurological: She is alert. A cranial nerve deficit is present. No sensory deficit. She exhibits abnormal muscle tone. She  displays seizure activity. Coordination abnormal. GCS eye subscore is 4. GCS verbal subscore is 4. GCS motor subscore is 6.  RUE and facial myoclonic activity w/ eye deviation to right  Right lower facial droop, + pronator drift on right, 1/5 strength RUE. 5/5 strength BLE  Skin: Skin is warm and dry.  Psychiatric: She has a normal mood and affect. Her behavior is normal.    ED Course  Procedures (including critical care time) Results for orders placed during the hospital encounter of 10/14/12  COMPREHENSIVE METABOLIC PANEL      Result Value Range   Sodium 134 (*) 135 - 145 mEq/L   Potassium 4.4  3.5 - 5.1 mEq/L   Chloride 99  96 - 112 mEq/L   CO2 27  19 - 32 mEq/L   Glucose, Bld 97  70 - 99 mg/dL   BUN 9  6 - 23 mg/dL   Creatinine, Ser 1.61  0.50 - 1.10 mg/dL   Calcium 9.0  8.4 - 16.1 mg/dL   Total Protein 7.0  6.0 - 8.3 g/dL   Albumin 3.2 (*) 3.5 - 5.2 g/dL   AST 20  0 - 37 U/L   ALT 20  0 - 35 U/L   Alkaline Phosphatase 101  39 - 117 U/L   Total Bilirubin 0.7  0.3 - 1.2 mg/dL   GFR calc non Af Amer 86 (*) >90 mL/min   GFR calc Af Amer >90  >90 mL/min  CBC WITH DIFFERENTIAL      Result Value Range   WBC 6.4  4.0 - 10.5 K/uL   RBC 4.36  3.87 - 5.11 MIL/uL   Hemoglobin 7.7 (*) 12.0 - 15.0 g/dL   HCT 09.6 (*) 04.5 - 40.9 %   MCV 66.5 (*) 78.0 - 100.0 fL   MCH 17.7 (*) 26.0 - 34.0 pg   MCHC 26.6 (*) 30.0 - 36.0 g/dL   RDW 81.1 (*) 91.4 - 78.2 %   Platelets 390  150 - 400 K/uL   Neutrophils Relative % 77  43 - 77 %   Lymphocytes Relative 12  12 - 46 %   Monocytes Relative 9  3 - 12 %   Eosinophils Relative 2  0 - 5 %   Basophils Relative 0  0 - 1 %   Neutro Abs 4.9  1.7 - 7.7 K/uL   Lymphs Abs 0.8  0.7 - 4.0 K/uL   Monocytes Absolute 0.6  0.1 - 1.0 K/uL   Eosinophils Absolute 0.1  0.0 - 0.7 K/uL   Basophils Absolute 0.0  0.0 - 0.1 K/uL   RBC Morphology POLYCHROMASIA PRESENT    PROTIME-INR      Result Value Range   Prothrombin Time 13.9  11.6 - 15.2 seconds   INR  1.09  0.00 - 1.49  GLUCOSE, CAPILLARY      Result Value Range   Glucose-Capillary 101 (*) 70 - 99 mg/dL   Comment 1 Notify RN     Comment 2 Call MD NNP PA CNM    CG4 I-STAT (LACTIC ACID)      Result Value Range   Lactic Acid, Venous 1.52  0.5 - 2.2 mmol/L  POCT I-STAT, CHEM 8      Result Value Range   Sodium 138  135 - 145 mEq/L   Potassium 4.6  3.5 - 5.1 mEq/L   Chloride 101  96 - 112 mEq/L   BUN 8  6 - 23 mg/dL   Creatinine, Ser 9.56  0.50 - 1.10 mg/dL   Glucose, Bld 98  70 - 99 mg/dL   Calcium, Ion 2.13 (*) 1.13 - 1.30 mmol/L   TCO2 25  0 - 100 mmol/L   Hemoglobin 9.5 (*) 12.0 - 15.0 g/dL   HCT 08.6 (*) 57.8 - 46.9 %   CT Head Wo Contrast (Final result)  Result time: 10/14/12 22:19:15    Final result by Rad Results In Interface (10/14/12 22:19:15)    Narrative:   *RADIOLOGY REPORT*  Clinical Data: Throbbing and cramping pain in right arm, shoulder, neck, head and right side of the face.  CT HEAD WITHOUT CONTRAST  Technique: Contiguous axial images were obtained from the base of the skull through the vertex without contrast.  Comparison: Maxillofacial CT performed 05/09/2003, and MRI/MRA of the brain performed 10/05/2012  Findings: Evolving acute infarct is noted within the left frontal and parietal lobes, more diffuse than seen on prior MRI,  though there is still relative sparing of the overlying gray matter. There is no evidence for hemorrhagic transformation at this time. No significant mass effect or midline shift is seen.  Scattered periventricular and subcortical white matter change likely reflects small vessel ischemic microangiopathy.  The posterior fossa, including the cerebellum, brainstem and fourth ventricle, is within normal limits. The third and lateral ventricles, and basal ganglia are unremarkable in appearance.  There is no evidence of fracture; visualized osseous structures are unremarkable in appearance. The orbits are within normal  limits. The paranasal sinuses and mastoid air cells are well-aerated. No significant soft tissue abnormalities are seen.  IMPRESSION:  1. Evolving acute infarct within the left frontal and parietal lobes, more diffuse than on the prior MRI, though there is still relative sparing of the overlying gray matter. No evidence for hemorrhagic transformation at this time. No mass effect or midline shift seen. 2. Scattered small vessel ischemic microangiopathy noted.  These results were called by telephone on 10/14/2012 at 10:17 p.m. to Dr. Audelia Hives, who verbally acknowledged these results.   Original Report Authenticated By: Tonia Ghent, M.D.         No results found. No diagnosis found.  MDM  Exam as above, simple partial seizure - given ativan 6mg  total w/ resolution of seizure activity. Neuro exam w/ worsening of pts prior deficit - RUE weakness, dysmetria, + right pronator drift, right lower facial droop. Loaded w/ 1gm keppra. CT head w/ evolving acute infarct in left frontal/parietal lobes, no hemorrhage or mass effect. Neurology consulted. Glucose normal, labs unremarkable. No tpa given 2/2 seizure activity. D/w neurology and internal medicine and pt admitted to stepdown unit in serious condition. Protecting airway, vitals stable, GCS now 11 (3,3,6), no clinical decompensation in ED  I have personally reviewed labs and imaging and considered in my MDM. Case d/w Dr Anitra Lauth  1. CVA (cerebral infarction)   2. Seizure      Audelia Hives, MD 10/15/12 [phone]

## 2012-10-14 NOTE — ED Notes (Signed)
Pt had stroke on 10th. Today she has throbbing and cramping pain in right arm, right shoulder, right neck and right side face, and head. Pt speaks clearly, answer questions appropriately. Grip greater on left. No grip on right. Twitching on right side.

## 2012-10-14 NOTE — ED Notes (Signed)
Dr. Reynolds at bedside.

## 2012-10-15 DIAGNOSIS — G40209 Localization-related (focal) (partial) symptomatic epilepsy and epileptic syndromes with complex partial seizures, not intractable, without status epilepticus: Secondary | ICD-10-CM

## 2012-10-15 HISTORY — DX: Localization-related (focal) (partial) symptomatic epilepsy and epileptic syndromes with complex partial seizures, not intractable, without status epilepticus: G40.209

## 2012-10-15 LAB — CBC
Hemoglobin: 6.7 g/dL — CL (ref 12.0–15.0)
MCH: 17.6 pg — ABNORMAL LOW (ref 26.0–34.0)
MCHC: 26.5 g/dL — ABNORMAL LOW (ref 30.0–36.0)
Platelets: 319 10*3/uL (ref 150–400)

## 2012-10-15 LAB — GLUCOSE, CAPILLARY
Glucose-Capillary: 82 mg/dL (ref 70–99)
Glucose-Capillary: 81 mg/dL (ref 70–99)
Glucose-Capillary: 115 mg/dL — ABNORMAL HIGH (ref 70–99)
Glucose-Capillary: 130 mg/dL — ABNORMAL HIGH (ref 70–99)

## 2012-10-15 LAB — HOMOCYSTEINE: Homocysteine: 7.5 umol/L (ref 4.0–15.4)

## 2012-10-15 LAB — MRSA PCR SCREENING: MRSA by PCR: NEGATIVE

## 2012-10-15 LAB — ANTITHROMBIN III: AntiThromb III Func: 79 % (ref 75–120)

## 2012-10-15 MED ORDER — MINERAL OIL PO OIL
30.0000 mL | TOPICAL_OIL | Freq: Every day | ORAL | Status: DC
  Administered 2012-10-15 – 2012-10-18 (×4): 30 mL via ORAL
  Filled 2012-10-15 (×5): qty 30

## 2012-10-15 MED ORDER — FAMOTIDINE 20 MG PO TABS
20.0000 mg | ORAL_TABLET | Freq: Two times a day (BID) | ORAL | Status: DC
  Administered 2012-10-15 – 2012-10-19 (×9): 20 mg via ORAL
  Filled 2012-10-15 (×11): qty 1

## 2012-10-15 MED ORDER — SODIUM CHLORIDE 0.9 % IJ SOLN
10.0000 mL | Freq: Two times a day (BID) | INTRAMUSCULAR | Status: DC
  Administered 2012-10-15: 20 mL
  Administered 2012-10-16 – 2012-10-18 (×2): 10 mL

## 2012-10-15 MED ORDER — CYCLOBENZAPRINE HCL 10 MG PO TABS
10.0000 mg | ORAL_TABLET | Freq: Three times a day (TID) | ORAL | Status: DC | PRN
  Administered 2012-10-15 – 2012-10-18 (×4): 10 mg via ORAL
  Filled 2012-10-15 (×5): qty 1

## 2012-10-15 MED ORDER — THYROID 120 MG PO TABS
150.0000 mg | ORAL_TABLET | Freq: Every day | ORAL | Status: DC
  Administered 2012-10-15 – 2012-10-19 (×5): 150 mg via ORAL
  Filled 2012-10-15 (×7): qty 1

## 2012-10-15 MED ORDER — CLOPIDOGREL BISULFATE 75 MG PO TABS
75.0000 mg | ORAL_TABLET | Freq: Every day | ORAL | Status: DC
  Administered 2012-10-15 – 2012-10-19 (×5): 75 mg via ORAL
  Filled 2012-10-15 (×6): qty 1

## 2012-10-15 MED ORDER — IPRATROPIUM-ALBUTEROL 20-100 MCG/ACT IN AERS
2.0000 | INHALATION_SPRAY | Freq: Four times a day (QID) | RESPIRATORY_TRACT | Status: DC
  Administered 2012-10-15 – 2012-10-16 (×2): 2 via RESPIRATORY_TRACT
  Filled 2012-10-15: qty 4

## 2012-10-15 MED ORDER — LORAZEPAM 2 MG/ML IJ SOLN: 1.0000 mg | INTRAMUSCULAR | Status: DC | PRN

## 2012-10-15 MED ORDER — ACETAMINOPHEN 325 MG PO TABS
650.0000 mg | ORAL_TABLET | ORAL | Status: DC | PRN
  Administered 2012-10-17 – 2012-10-19 (×2): 650 mg via ORAL
  Filled 2012-10-15 (×2): qty 2

## 2012-10-15 MED ORDER — ENOXAPARIN SODIUM 40 MG/0.4ML ~~LOC~~ SOLN
40.0000 mg | SUBCUTANEOUS | Status: DC
  Administered 2012-10-15 – 2012-10-19 (×5): 40 mg via SUBCUTANEOUS
  Filled 2012-10-15 (×5): qty 0.4

## 2012-10-15 MED ORDER — OXYCODONE HCL 5 MG PO TABS
15.0000 mg | ORAL_TABLET | Freq: Three times a day (TID) | ORAL | Status: DC | PRN
  Administered 2012-10-17 – 2012-10-19 (×3): 15 mg via ORAL
  Filled 2012-10-15: qty 2
  Filled 2012-10-15: qty 1
  Filled 2012-10-15 (×3): qty 3

## 2012-10-15 MED ORDER — BISACODYL 10 MG RE SUPP
10.0000 mg | Freq: Every day | RECTAL | Status: DC | PRN
  Administered 2012-10-16: 10 mg via RECTAL
  Filled 2012-10-15: qty 1

## 2012-10-15 MED ORDER — DOCUSATE SODIUM 100 MG PO CAPS
100.0000 mg | ORAL_CAPSULE | Freq: Three times a day (TID) | ORAL | Status: DC
  Administered 2012-10-15 – 2012-10-19 (×13): 100 mg via ORAL
  Filled 2012-10-15 (×15): qty 1

## 2012-10-15 MED ORDER — LEVETIRACETAM 500 MG PO TABS
500.0000 mg | ORAL_TABLET | Freq: Two times a day (BID) | ORAL | Status: DC
  Administered 2012-10-16 – 2012-10-19 (×7): 500 mg via ORAL
  Filled 2012-10-15 (×10): qty 1

## 2012-10-15 MED ORDER — ACETAMINOPHEN 650 MG RE SUPP: 650.0000 mg | RECTAL | Status: DC | PRN

## 2012-10-15 MED ORDER — LORATADINE 10 MG PO TABS
10.0000 mg | ORAL_TABLET | Freq: Every day | ORAL | Status: DC
  Administered 2012-10-15 – 2012-10-19 (×5): 10 mg via ORAL
  Filled 2012-10-15 (×5): qty 1

## 2012-10-15 MED ORDER — BISOPROLOL FUMARATE 10 MG PO TABS
10.0000 mg | ORAL_TABLET | Freq: Every day | ORAL | Status: DC
  Administered 2012-10-15 – 2012-10-19 (×5): 10 mg via ORAL
  Filled 2012-10-15 (×6): qty 1

## 2012-10-15 MED ORDER — SODIUM CHLORIDE 0.9 % IJ SOLN
10.0000 mL | Freq: Two times a day (BID) | INTRAMUSCULAR | Status: DC
  Administered 2012-10-15 – 2012-10-19 (×4): 10 mL

## 2012-10-15 MED ORDER — ASPIRIN 325 MG PO TABS
325.0000 mg | ORAL_TABLET | Freq: Every evening | ORAL | Status: DC
  Administered 2012-10-15 – 2012-10-18 (×4): 325 mg via ORAL
  Filled 2012-10-15 (×6): qty 1

## 2012-10-15 MED ORDER — OXYCODONE HCL ER 10 MG PO T12A
20.0000 mg | EXTENDED_RELEASE_TABLET | Freq: Three times a day (TID) | ORAL | Status: DC
  Administered 2012-10-15 – 2012-10-19 (×14): 20 mg via ORAL
  Filled 2012-10-15 (×14): qty 2

## 2012-10-15 MED ORDER — ONDANSETRON 4 MG PO TBDP
4.0000 mg | ORAL_TABLET | Freq: Three times a day (TID) | ORAL | Status: DC | PRN
  Administered 2012-10-15 – 2012-10-18 (×2): 4 mg via ORAL
  Filled 2012-10-15 (×2): qty 1

## 2012-10-15 MED ORDER — SENNA 8.6 MG PO TABS
2.0000 | ORAL_TABLET | Freq: Two times a day (BID) | ORAL | Status: DC
  Administered 2012-10-15 – 2012-10-19 (×9): 17.2 mg via ORAL
  Filled 2012-10-15 (×12): qty 2

## 2012-10-15 MED ORDER — SODIUM CHLORIDE 0.9 % IV SOLN
500.0000 mg | Freq: Two times a day (BID) | INTRAVENOUS | Status: DC
  Administered 2012-10-15 (×2): 500 mg via INTRAVENOUS
  Filled 2012-10-15 (×3): qty 5

## 2012-10-15 MED ORDER — FUROSEMIDE 40 MG PO TABS
40.0000 mg | ORAL_TABLET | Freq: Every day | ORAL | Status: DC
  Filled 2012-10-15: qty 1

## 2012-10-15 MED ORDER — OXYCODONE HCL ER 20 MG PO T12A
20.0000 mg | EXTENDED_RELEASE_TABLET | Freq: Three times a day (TID) | ORAL | Status: DC
  Filled 2012-10-15 (×3): qty 1

## 2012-10-15 MED ORDER — IPRATROPIUM-ALBUTEROL 18-103 MCG/ACT IN AERO
2.0000 | INHALATION_SPRAY | Freq: Four times a day (QID) | RESPIRATORY_TRACT | Status: DC | PRN
Start: 2012-10-15 — End: 2012-10-15
  Filled 2012-10-15: qty 14.7

## 2012-10-15 MED ORDER — SODIUM CHLORIDE 0.9 % IJ SOLN
10.0000 mL | INTRAMUSCULAR | Status: DC | PRN
  Administered 2012-10-17 – 2012-10-19 (×2): 20 mL

## 2012-10-15 MED ORDER — SODIUM CHLORIDE 0.9 % IJ SOLN: 10.0000 mL | INTRAMUSCULAR | Status: DC | PRN

## 2012-10-15 NOTE — Consult Note (Signed)
NAMEALIANIS, TRIMMER                 ACCOUNT NO.:  [id number]  MEDICAL RECORD NO.:  [id number]  LOCATION:  2110                         FACILITY:  [facility]  PHYSICIAN:  Josph Macho, M.D.  DATE OF BIRTH:  [birth year]  DATE OF CONSULTATION:  10/15/2012 DATE OF DISCHARGE:                                CONSULTATION   REFERRING PHYSICIAN:  Zannie Cove, MD  REASON FOR CONSULTATION: 1. Progressive left parietal infarct. 2. Polycythemia vera-hyperviscosity variant. 3. Status post carotid endarterectomy. 4. Cholecystitis, status post cholecystotomy.  HISTORY OF PRESENT ILLNESS:  [name] is very well known to me.  She is a very nice 67 year old white female.  I have been seeing her for about 15 years.  She has polycythemia vera.  She has a hyperviscosity variant. She typically has a hemoglobin of about 5-6.  She was recently discharged from [facility] after having an initial admission for cholecystitis.  She had a cholecystotomy tube placed in anticipation of her gallbladder being taken out about 8 weeks.  Unfortunately, she did develop CVA.  She was found to have high-grade left carotid artery stenosis.  She had an endarterectomy with a good result.  Upon discharge, she was doing well.  She had a good neurological function.  She regained strength in her right hand.  At the time of discharge, her hemoglobin was 6.4.  She was doing well.  She is on aspirin and Plavix.  She then apparently had a seizure at home.  She was brought to the hospital.  She had a CT scan, which unfortunately showed progression of the right parietal infarct.  Again, this is quite surprising as she had the endarterectomy.  Everything else looked okay in the brain.  When she was taken to the ER, her hemoglobin was 7.7 with hematocrit of 29.  White cell count was 6.4.  Platelet count was 390.  Electrolytes all looked okay.  She is brought to the ICU.  She is lethargic, but aware of what is going on.   She knows where she is.  She does not have any strength in the right arm.  She does have good word finding.  PAST MEDICAL HISTORY:  Well documented.  ALLERGIES: 1. CODEINE. 2. SYNTHROID. 3. CALCIUM CHANNEL BLOCKERS. 4. PENICILLIN.  ADMISSION MEDICATIONS: 1. Combivent inhaler 2 puffs q.6 hours p.r.n. 2. Xanax 0.5 mg p.o. at bedtime. 3. Aspirin 325 mg p.o. daily. 4. Bisoprolol 10 mg p.o. daily. 5. Plavix 75 mg p.o. daily. 6. Flexeril 10 mg p.o. t.i.d. p.r.n. 7. Premarin 0.625 mg p.o. daily. 8. Pepcid 20 mg p.o. b.i.d. 9. Androxy 10 mg p.o. daily. 10.Lasix 80 mg p.o. daily. 11.OxyContin 30 mg p.o. b.i.d. 12.Potassium 40 mEq p.o. daily. 13.Roxicodone 50 mg p.o. q.8 hours p.r.n. 14.Armour Thyroid 150 mg p.o. daily. 15.Restoril 30 mg p.o. at bedtime.  SOCIAL HISTORY:  Negative for tobacco or alcohol use.  FAMILY HISTORY:  Noncontributory.  REVIEW OF SYSTEMS:  As stated in history of present illness.  PHYSICAL EXAMINATION:  GENERAL:  This is a lethargic white female, in no obvious distress. VITAL SIGNS:  Temperature of 98, pulse 64, respiratory rate 19, blood pressure 128/58. HEAD AND NECK:  Shows no ocular or oral lesions.  There may be some slight right-sided facial weakness.  There may be some slight ptosis of the right eye.  She has no intraoral lesions. NECK:  Supple with no adenopathy. LUNGS: Clear bilaterally. CARDIAC:  Regular rate and rhythm with normal S1, S2.  There are no murmurs, rubs, or bruits. ABDOMEN:  Soft with good bowel sounds.  There is no palpable abdominal mass.  There is no fluid wave.  There is no palpable hepatosplenomegaly. EXTREMITIES:  Shows no strength in the right arm.  She has good strength in her left arm.  She has good strength in her legs. SKIN:  Shows no rashes, ecchymosis, or petechia. NEUROLOGICAL:  Shows no cranial nerve deficits.  IMPRESSION:  Ms. Group is a 67 year old female with progression of her cerebrovascular accident.  She  has the right upper extremity weakness.  Again, it is shocked that this happened.  She had a carotid endarterectomy.  She was doing great when she was discharged.  I still believe that we need to keep her hemoglobin of around 6.  As such, she will get phlebotomized.  She is on Premarin.  I think we are going to have to stop this.  She is also on a synthetic androgen.  We may also have to discontinue this.  I do not think she has any hypercoagulable issues outside of polycythemia.  However, I will check for any other potential problem.  I think the issue is whether or not she needs to be on anticoagulation with heparin/Coumadin/NOAC.  We will let Neurology decide this.  We will need to have a PICC line placed.  We will follow her along during the hospitalization.     Josph Macho, M.D.     PRE/MEDQ  D:  10/15/2012  T:  10/15/2012  Job:  [id number]

## 2012-10-15 NOTE — Progress Notes (Signed)
Subjective: Patient has had no further seizure activity.  Continues to have some RUE weakness but improved from initial presentation.  Tolerating Keppra with out noted side effects.    Objective: Current vital signs: BP 111/47  Pulse 69  Temp(Src) 98.5 F (36.9 C) (Oral)  Resp 17  Ht 5\' 3"  (1.6 m)  Wt 73.9 kg (162 lb 14.7 oz)  BMI 28.87 kg/m2  SpO2 96% Vital signs in last 24 hours: Temp:  [97.8 F (36.6 C)-98.5 F (36.9 C)] 98.5 F (36.9 C) (07/20 2001) Pulse Rate:  [58-132] 69 (07/20 2100) Resp:  [14-27] 17 (07/20 2100) BP: (110-145)/(46-86) 111/47 mmHg (07/20 2100) SpO2:  [96 %-100 %] 96 % (07/20 2100) Weight:  [73.9 kg (162 lb 14.7 oz)] 73.9 kg (162 lb 14.7 oz) (07/20 0200)  Intake/Output from previous day: 07/19 0701 - 07/20 0700 In: -  Out: 650 [Urine:450] Intake/Output this shift:   Nutritional status: Cardiac  Neurologic Exam: Mental Status: Alert, oriented, thought content appropriate.  Speech fluent without evidence of aphasia.  Able to follow 3 step commands without difficulty. Cranial Nerves: II: Discs flat bilaterally; Visual fields grossly normal, pupils equal, round, reactive to light and accommodation III,IV, VI: ptosis not present, extra-ocular motions intact bilaterally V,VII: decrease in right NLF VIII: hearing normal bilaterally IX,X: gag reflex present XI: bilateral shoulder shrug XII: midline tongue extension Motor: Right : Upper extremity   3-/5    Left:     Upper extremity   5/5  Lower extremity   5/5     Lower extremity   5/5 Tone and bulk:normal tone throughout; no atrophy noted Sensory: Pinprick and light touch intact throughout, bilaterally Deep Tendon Reflexes: 2+ in the upper extremities and absent in the BLE's Plantars: Right: upgoing   Left: downgoing  Lab Results: Basic Metabolic Panel:  Recent Labs Lab 10/09/12 0500 10/10/12 0550 10/11/12 0434 10/14/12 2103 10/14/12 2201  NA 134* 136 134* 134* 138  K 4.6 4.5 4.7 4.4 4.6   CL 98 98 102 99 101  CO2 31 32 27 27  --   GLUCOSE 92 95 107* 97 98  BUN 7 6 8 9 8   CREATININE 0.71 0.69 0.64 0.71 0.80  CALCIUM 8.9 8.6 8.4 9.0  --     Liver Function Tests:  Recent Labs Lab 10/14/12 2103  AST 20  ALT 20  ALKPHOS 101  BILITOT 0.7  PROT 7.0  ALBUMIN 3.2*   No results found for this basename: LIPASE, AMYLASE,  in the last 168 hours No results found for this basename: AMMONIA,  in the last 168 hours  CBC:  Recent Labs Lab 10/10/12 0550 10/11/12 0434 10/12/12 0810 10/14/12 2103 10/14/12 2201 10/15/12 0825  WBC 5.1 6.8 8.1 6.4  --  6.0  NEUTROABS  --   --   --  4.9  --   --   HGB 6.8* 6.1* 6.4* 7.7* 9.5* 6.7*  HCT 25.9* 22.8* 24.4* 29.0* 28.0* 25.3*  MCV 65.6* 65.5* 66.8* 66.5*  --  66.4*  PLT 238 254 283 390  --  319    Cardiac Enzymes: No results found for this basename: CKTOTAL, CKMB, CKMBINDEX, TROPONINI,  in the last 168 hours  Lipid Panel: No results found for this basename: CHOL, TRIG, HDL, CHOLHDL, VLDL, LDLCALC,  in the last 168 hours  CBG:  Recent Labs Lab 10/15/12 0209 10/15/12 0350 10/15/12 0724 10/15/12 1129 10/15/12 1932  GLUCAP 85 82 81 115* 130*    Microbiology: Results for orders  placed during the hospital encounter of 10/14/12  MRSA PCR SCREENING     Status: None   Collection Time    10/15/12  2:11 AM      Result Value Range Status   MRSA by PCR NEGATIVE  NEGATIVE Final   Comment:            The GeneXpert MRSA Assay (FDA     approved for NASAL specimens     only), is one component of a     comprehensive MRSA colonization     surveillance program. It is not     intended to diagnose MRSA     infection nor to guide or     monitor treatment for     MRSA infections.    Coagulation Studies:  Recent Labs  10/14/12 2103  LABPROT 13.9  INR 1.09    Imaging: Ct Head Wo Contrast  10/14/2012   *RADIOLOGY REPORT*  Clinical Data: Throbbing and cramping pain in right arm, shoulder, neck, head and right side of  the face.  CT HEAD WITHOUT CONTRAST  Technique:  Contiguous axial images were obtained from the base of the skull through the vertex without contrast.  Comparison: Maxillofacial CT performed 05/09/2003, and MRI/MRA of the brain performed 10/05/2012  Findings:   Evolving acute infarct is noted within the left frontal and parietal lobes, more diffuse than seen on prior MRI, though there is still relative sparing of the overlying gray matter. There is no evidence for hemorrhagic transformation at this time. No significant mass effect or midline shift is seen.  Scattered periventricular and subcortical white matter change likely reflects small vessel ischemic microangiopathy.  The posterior fossa, including the cerebellum, brainstem and fourth ventricle, is within normal limits.  The third and lateral ventricles, and basal ganglia are unremarkable in appearance.  There is no evidence of fracture; visualized osseous structures are unremarkable in appearance.  The orbits are within normal limits. The paranasal sinuses and mastoid air cells are well-aerated.  No significant soft tissue abnormalities are seen.  IMPRESSION:  1.  Evolving acute infarct within the left frontal and parietal lobes, more diffuse than on the prior MRI, though there is still relative sparing of the overlying gray matter.  No evidence for hemorrhagic transformation at this time.  No mass effect or midline shift seen. 2.  Scattered small vessel ischemic microangiopathy noted.  These results were called by telephone on 10/14/2012 at 10:17 p.m. to Dr. Audelia Hives, who verbally acknowledged these results.   Original Report Authenticated By: Tonia Ghent, M.D.    Medications:  I have reviewed the patient's current medications. Scheduled: . aspirin  325 mg Oral QPM  . bisoprolol  10 mg Oral Daily  . clopidogrel  75 mg Oral Daily  . docusate sodium  100 mg Oral TID  . enoxaparin (LOVENOX) injection  40 mg Subcutaneous Q24H  . famotidine  20  mg Oral BID  . Ipratropium-Albuterol  2 puff Inhalation Q6H  . levETIRAcetam  500 mg Intravenous Q12H  . loratadine  10 mg Oral Daily  . mineral oil  30 mL Oral QHS  . OxyCODONE  20 mg Oral Q8H  . senna  2 tablet Oral BID  . sodium chloride  10 mL Intracatheter Q12H  . sodium chloride  10-40 mL Intracatheter Q12H  . thyroid  150 mg Oral QAC breakfast    Assessment/Plan: Patient without further seizures noted.  On maintenance Keppra and tolerating well.  Still unclear if right weakness  secondary to a Todd's versus a new ischemic event.     Recommendations: 1.  MRI pending 2.  Continue Keppra at 500mg  BID.  May change to po dosing.   3.  Continue seizure precautions   LOS: 1 day   Thana Farr, MD [facility] [phone] 10/15/2012  9:13 PM

## 2012-10-15 NOTE — Progress Notes (Signed)
CRITICAL VALUE ALERT  Critical value received:  Hb 6.7  Date of notification:  7/20  Time of notification:  0910  Critical value read back:yes  Nurse who received alert:  Kathlen Mody, RN  MD notified (1st page):  Dr. Jomarie Longs  Time of first page:  0910  MD notified (2nd page):  Time of second page:  Responding MD:  Dr. Jomarie Longs  Time MD responded:  [phone]

## 2012-10-15 NOTE — Consult Note (Signed)
#   [id number] is the consult note.  I would phlebotomize her and keep the Hgb < 6.  I would probably d/c the Premarin.  She is on a synthetic androgen also.  Would d/c this.  I suspect that she will need a PICC line.  The big question is whether or not she needs true anti-coagulation.  Will defer to neurology for this "?"  Thanks for all of your help!!!  Hewitt Shorts  1 John 1:7-9

## 2012-10-15 NOTE — Progress Notes (Addendum)
Pt seen and examined, admitted around 3am per Dr.Patel with Seizures and worsening R sided weakness CT brain with progression of her CVA Has P Vera with hyperviscosity syndrome, Hb up to 7.7 last night, goal is <7. Will repeat CBC STAT I have d/w Dr.Ennever will need Phlebotomy if >7, duration and frequency of phlebotomy per Heme recs CVA: Continue ASA/Plavix, RN swallow screen For seizures, now on Keppra, Neuro following S/p Perc Chole drain for cholecystitis 7/7  Zannie Cove, MD [phone]

## 2012-10-15 NOTE — Progress Notes (Signed)
Peripherally Inserted Central Catheter/Midline Placement  The IV Nurse has discussed with the patient and/or persons authorized to consent for the patient, the purpose of this procedure and the potential benefits and risks involved with this procedure.  The benefits include less needle sticks, lab draws from the catheter and patient may be discharged home with the catheter.  Risks include, but not limited to, infection, bleeding, blood clot (thrombus formation), and puncture of an artery; nerve damage and irregular heat beat.  Alternatives to this procedure were also discussed.  PICC/Midline Placement Documentation  PICC / Midline Double Lumen 10/15/12 PICC Left Basilic (Active)       [name] 10/15/2012, 3:53 PM

## 2012-10-15 NOTE — H&P (Signed)
[facility] History and Physical  [name]  ZOX:[id number]  DOB: Aug 17, 1945  DOA: 10/14/2012  Referring physician: Dr Gary Fleet PCP: [name], MD  Specialists: Dr Thad Ranger  Chief Complaint: Seizures  HPI: [name] is a 67 y.o. female with Past medical history of polycythemia vera, hypertension, fibromyalgia, hypothyroidism who was recently admitted for acute cholecystitis and had an acute stroke as well, now coming in with 9 episode of recurrent seizures. As per the husband the pt had started having episodes of shaking of the right elbow and face and eyes turning towards right side last night adn since then she has 8 more similar episodes. The longest episode was in the ED which lasted for 25 minutes resolved with Ativan. She has been more and more drowsy every since her first episode. She never had lost control or bowel or bladder function or tongue bites.  She has some fever since last 2 days as per husband but she has not measured her TEMP. She has taken a Tablet of cipro though due to fever. There is no significant change in her medication as per husband. She has not reported any chest pain, chills, diarrhea, active bleeding, urinary symptoms, cough to her husband.  Review of Systems: as mentioned in the history of present illness.  A Comprehensive review of the other systems is negative.  Past Medical History  Diagnosis Date  . 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 Fibromyalgia   . Hypothyroidism   . Essential hypertension   . Postmenopausal state     on hormone replacement therapy  . Aortic valve sclerosis     by echocardiogram 12/04/2009  . PAC (premature atrial contraction)     history of  . Coronary artery disease   . Hyperlipidemia   . History of echocardiogram 12/04/2009    showed mild ventricle hypertrophy and normal EF at 55-60% -- mild aortic valve sclerosis  -- trace mitral regurgitation -- slightly pulmonary artery pressure  at 45  --  [name], M.D.   . Fatigue    Past Surgical History  Procedure Laterality Date  . Endarterectomy Left 10/10/2012    Procedure: ENDARTERECTOMY CAROTID- LEFT NECK WITH GREATER SAPHENOUS VEIN PATCH LEFT LEG;  Surgeon: Sherren Kerns, MD;  Location: [facility] OR;  Service: Vascular;  Laterality: Left;   Social History:  reports that she has never smoked. She does not have any smokeless tobacco history on file. She reports that she does not drink alcohol or use illicit drugs. Patient is coming from home. Patient cannot participate in ADLs.  Allergies  Allergen Reactions  . Codeine Nausea Only  . Synthroid (Levothyroxine Sodium) Other (See Comments)    Not effective. Causes excessive sleepiness. Takes armour thyroid  . Calcium Channel Blockers Palpitations  . Penicillins Rash    Family History  Problem Relation Age of Onset  . Heart attack Father   . Coronary artery disease Mother     had aortic valve replacement    Prior to Admission medications   Medication Sig Start Date End Date Taking? Authorizing Provider  albuterol-ipratropium (COMBIVENT) 18-103 MCG/ACT inhaler Inhale 2 puffs into the lungs every 6 (six) hours as needed for wheezing.   Yes Historical Provider, MD  ALPRAZolam [name]) 0.5 MG tablet Take 0.5 mg by mouth at bedtime. 06/27/12  Yes Josph Macho, MD  aspirin 325 MG tablet Take 325 mg by mouth every evening.    Yes Historical Provider, MD  bisoprolol (ZEBETA) 10  MG tablet Take 10 mg by mouth daily.   Yes Historical Provider, MD  cholecalciferol (VITAMIN D) 1000 UNITS tablet Take 3,000 Units by mouth daily.    Yes Historical Provider, MD  clopidogrel (PLAVIX) 75 MG tablet Take 75 mg by mouth daily.   Yes Historical Provider, MD  cyclobenzaprine (FLEXERIL) 10 MG tablet Take 10 mg by mouth 3 (three) times daily as needed for muscle spasms.    Yes Historical Provider, MD  docusate sodium (COLACE) 100 MG capsule Take 100 mg by mouth 3 (three) times daily.   Yes  Historical Provider, MD  estrogens, conjugated, (PREMARIN) 0.625 MG tablet Take 0.625 mg by mouth daily.    Yes Historical Provider, MD  famotidine (PEPCID) 20 MG tablet Take 1 tablet (20 mg total) by mouth 2 (two) times daily. 07/03/12  Yes [name], MD  fexofenadine (ALLEGRA) 180 MG tablet Take 180 mg by mouth daily.     Yes Historical Provider, MD  fluoxymesterone (ANDROXY) 10 MG tablet Take 2.5 mg by mouth daily as needed (before sexual activity).    Yes Historical Provider, MD  furosemide (LASIX) 40 MG tablet Take 80 mg by mouth daily. 10/12/12  Yes Ripudeep Jenna Luo, MD  Garlic Oil 1000 MG CAPS Take 1 capsule by mouth daily.    Yes Historical Provider, MD  lidocaine-prilocaine (EMLA) cream Apply topically as needed. Apply to phlebotomy site at least one hour prior to phlebotomy. 01/20/12  Yes Josph Macho, MD  magnesium oxide (MAG-OX) 400 MG tablet Take 1,200 mg by mouth daily.     Yes Historical Provider, MD  mineral oil liquid Take 30 mLs by mouth at bedtime.   Yes Historical Provider, MD  oxyCODONE (OXYCONTIN) 10 MG 12 hr tablet Take 10 mg by mouth every 12 (twelve) hours as needed for pain. Brand name medically necessary. 09/27/12  Yes Josph Macho, MD  oxyCODONE (OXYCONTIN) 20 MG 12 hr tablet Take 1 tablet (20 mg total) by mouth every 8 (eight) hours. Brand name medically necessary. 09/27/12  Yes Josph Macho, MD  potassium chloride (K-DUR) 10 MEQ tablet Take 4 tablets (40 mEq total) by mouth daily. 10/12/12  Yes Ripudeep Jenna Luo, MD  promethazine (PHENERGAN) 25 MG suppository Place 25 mg rectally every 6 (six) hours as needed for nausea.   Yes Historical Provider, MD  ROXICODONE 15 MG immediate release tablet Take 1 tablet (15 mg total) by mouth every 8 (eight) hours as needed for pain. Brand name medically necessary. 09/27/12 01/10/13 Yes Josph Macho, MD  Sennosides (SENNA LAX PO) Take 1 tablet by mouth 3 (three) times daily.    Yes Historical Provider, MD  temazepam (RESTORIL) 30  MG capsule Take 30 mg by mouth at bedtime.    Yes Historical Provider, MD  thyroid (ARMOUR) 60 MG tablet Take 150 mg by mouth daily before breakfast. Levothyroxine is not effective. Takes only armour thyroid.   Yes Historical Provider, MD  Valerian 100 MG CAPS Take 1 capsule by mouth daily as needed (for sleep).    Yes Historical Provider, MD  diphenhydrAMINE (BENADRYL) 25 MG tablet Take 50 mg by mouth every 8 (eight) hours as needed.     Historical Provider, MD  ondansetron (ZOFRAN ODT) 4 MG disintegrating tablet Take 1 tablet (4 mg total) by mouth every 8 (eight) hours as needed for nausea. 10/12/12   Ripudeep Jenna Luo, MD  UNABLE TO FIND Out-patient physical therapy  Diagnosis: CVA, deconditioning 10/12/12   Ripudeep Jenna Luo,  MD    Physical Exam: Filed Vitals:   10/15/12 0030 10/15/12 0100 10/15/12 0200 10/15/12 0215  BP: 123/58 130/57  139/62  Pulse: 58 59  73  Temp:   97.8 F (36.6 C)   TempSrc:   Oral   Resp: 16 15  20   Height:   5\' 3"  (1.6 m)   Weight:   73.9 kg (162 lb 14.7 oz)   SpO2: 100% 100%  100%    General: Drowsy but arousable, not oriented,  Eyes: PERRL ENT: Oral Mucosa clear moist. Neck: no JVD, no Carotid Bruits, no Stiffness Cardiovascular: S1 and S2 Present, Murmur present, Peripheral Pulses Present Respiratory: Clear to Auscultation , Bilateral Air entry equal and Decreased,  Abdomen: Bowel Sound Present, Soft and Non tender presence of drain from prior admission, no redness of surrounding area Extremities: Pedal edema absent, calf tenderness absent. Neurologic: Mental status: drowsy but arousable, word finding difficulty, not oriented to time and place, Motor strength equal other then right UE which is weak,  Sensation intact top light touch,  Reflexes present  Labs on Admission:  Basic Metabolic Panel:  Recent Labs Lab 10/09/12 0500 10/10/12 0550 10/11/12 0434 10/14/12 2103 10/14/12 2201  NA 134* 136 134* 134* 138  K 4.6 4.5 4.7 4.4 4.6  CL 98 98 102  99 101  CO2 31 32 27 27  --   GLUCOSE 92 95 107* 97 98  BUN 7 6 8 9 8   CREATININE 0.71 0.69 0.64 0.71 0.80  CALCIUM 8.9 8.6 8.4 9.0  --    Liver Function Tests:  Recent Labs Lab 10/14/12 2103  AST 20  ALT 20  ALKPHOS 101  BILITOT 0.7  PROT 7.0  ALBUMIN 3.2*   No results found for this basename: LIPASE, AMYLASE,  in the last 168 hours No results found for this basename: AMMONIA,  in the last 168 hours CBC:  Recent Labs Lab 10/09/12 0500 10/10/12 0550 10/11/12 0434 10/12/12 0810 10/14/12 2103 10/14/12 2201  WBC 6.4 5.1 6.8 8.1 6.4  --   NEUTROABS  --   --   --   --  4.9  --   HGB 6.5* 6.8* 6.1* 6.4* 7.7* 9.5*  HCT 24.9* 25.9* 22.8* 24.4* 29.0* 28.0*  MCV 65.7* 65.6* 65.5* 66.8* 66.5*  --   PLT 227 238 254 283 390  --    Cardiac Enzymes: No results found for this basename: CKTOTAL, CKMB, CKMBINDEX, TROPONINI,  in the last 168 hours  BNP (last 3 results)  Recent Labs  10/05/12 0455  PROBNP 2326.0*   CBG:  Recent Labs Lab 10/14/12 2145 10/15/12 0209  GLUCAP 101* 85    Radiological Exams on Admission: Ct Head Wo Contrast  10/14/2012   *RADIOLOGY REPORT*  Clinical Data: Throbbing and cramping pain in right arm, shoulder, neck, head and right side of the face.  CT HEAD WITHOUT CONTRAST  Technique:  Contiguous axial images were obtained from the base of the skull through the vertex without contrast.  Comparison: Maxillofacial CT performed 05/09/2003, and MRI/MRA of the brain performed 10/05/2012  Findings:   Evolving acute infarct is noted within the left frontal and parietal lobes, more diffuse than seen on prior MRI, though there is still relative sparing of the overlying gray matter. There is no evidence for hemorrhagic transformation at this time. No significant mass effect or midline shift is seen.  Scattered periventricular and subcortical white matter change likely reflects small vessel ischemic microangiopathy.  The posterior fossa, including  the cerebellum,  brainstem and fourth ventricle, is within normal limits.  The third and lateral ventricles, and basal ganglia are unremarkable in appearance.  There is no evidence of fracture; visualized osseous structures are unremarkable in appearance.  The orbits are within normal limits. The paranasal sinuses and mastoid air cells are well-aerated.  No significant soft tissue abnormalities are seen.  IMPRESSION:  1.  Evolving acute infarct within the left frontal and parietal lobes, more diffuse than on the prior MRI, though there is still relative sparing of the overlying gray matter.  No evidence for hemorrhagic transformation at this time.  No mass effect or midline shift seen. 2.  Scattered small vessel ischemic microangiopathy noted.  These results were called by telephone on 10/14/2012 at 10:17 p.m. to Dr. Audelia Hives, who verbally acknowledged these results.   Original Report Authenticated By: Tonia Ghent, M.D.    Assessment/Plan Principal Problem:   Complex partial seizure disorder Active Problems:   Hypothyroidism   Polycythemia vera(238.4)   Unspecified constipation   CVA (cerebral infarction)   Acute cholecystitis   1. Complex partial Seizure: The pt has been seen by neurology Her CT head reveal progressive infarct As per neurology the pt may need MRI Seizure broke with Ativan and pt has been loaded with 1 gm of keppra, we will continue 500 mg bid keppra and PRN Ativan. The pt is full code at present, but as per the husband if aggressive measure are futile then he would like to make her DNR. Holding xanax and temazepam at present due to her mental condition. Seizure precaution Aspiration and Fall precaution.  2. Recent CVA with continuing progression ER physician has discussed the case with Neurology At present no active measures. Continue aspirin and lipitor. Permissive hypertension  3.Fibromyalgia The pt has been on Oxycodone and flexeril and goes to withdrawal easily without  the medication. At present we will assess the pt on regular interval and continue the Basal Oxycodone.  4. Polycythemia Vera As per prior notes from hematology her Hb should not be more then 7 and currently she has been on 7.6 She may need hematology consult or phlebotomy to bring her Hb down.  5. hypothyroidism Has been on armour thyroid and we will continue the same.  6. Fever in AM At present no sign of infection or fever. We will monitor,  DVT Prophylaxis: Lovenox 40 mg Q24 hrs Code Status: full  Family Communication: husband was at bedside on my evaluation.   Author: Lynden Oxford, MD [facility] Pager: [phone] 10/15/2012 3:17 AM    If 7PM-7AM, please contact night-coverage [email or web address] Password [email or web address]

## 2012-10-15 NOTE — ED Provider Notes (Signed)
I saw and evaluated the patient, reviewed the resident's note and I agree with the findings and plan. I have reviewed EKG and agree with the resident interpretation.  you Pt with recent stroke now returing with partial sx.  Pt is awke and alert.  However pt required multiple rounds of ativan to break sz (6mg  of ativan) and loading dose of keppra with new evovling infarct on head CT without bleed.  Neuro involved and admitted to hospitalist  CRITICAL CARE Performed by: Gwyneth Sprout Total critical care time: 30 Critical care time was exclusive of separately billable procedures and treating other patients. Critical care was necessary to treat or prevent imminent or life-threatening deterioration. Critical care was time spent personally by me on the following activities: development of treatment plan with patient and/or surrogate as well as nursing, discussions with consultants, evaluation of patient's response to treatment, examination of patient, obtaining history from patient or surrogate, ordering and performing treatments and interventions, ordering and review of laboratory studies, ordering and review of radiographic studies, pulse oximetry and re-evaluation of patient's condition.   Gwyneth Sprout, MD 10/15/12 1420

## 2012-10-15 NOTE — ED Notes (Signed)
Admitting at bedside 

## 2012-10-16 ENCOUNTER — Inpatient Hospital Stay (HOSPITAL_COMMUNITY): Payer: Medicare Other

## 2012-10-16 DIAGNOSIS — I2510 Atherosclerotic heart disease of native coronary artery without angina pectoris: Secondary | ICD-10-CM

## 2012-10-16 DIAGNOSIS — G40209 Localization-related (focal) (partial) symptomatic epilepsy and epileptic syndromes with complex partial seizures, not intractable, without status epilepticus: Secondary | ICD-10-CM

## 2012-10-16 DIAGNOSIS — I6350 Cerebral infarction due to unspecified occlusion or stenosis of unspecified cerebral artery: Principal | ICD-10-CM

## 2012-10-16 DIAGNOSIS — D45 Polycythemia vera: Secondary | ICD-10-CM

## 2012-10-16 DIAGNOSIS — K810 Acute cholecystitis: Secondary | ICD-10-CM

## 2012-10-16 LAB — CBC
HCT: 24.2 % — ABNORMAL LOW (ref 36.0–46.0)
MCHC: 26.4 g/dL — ABNORMAL LOW (ref 30.0–36.0)
MCV: 65.6 fL — ABNORMAL LOW (ref 78.0–100.0)
Platelets: 378 10*3/uL (ref 150–400)
RDW: 27.3 % — ABNORMAL HIGH (ref 11.5–15.5)
WBC: 5.5 10*3/uL (ref 4.0–10.5)

## 2012-10-16 LAB — PROTEIN C, TOTAL: Protein C, Total: 49 % — ABNORMAL LOW (ref 72–160)

## 2012-10-16 LAB — LUPUS ANTICOAGULANT PANEL
DRVVT: 28.1 secs (ref <42.9)
Lupus Anticoagulant: NOT DETECTED

## 2012-10-16 LAB — BASIC METABOLIC PANEL
BUN: 6 mg/dL (ref 6–23)
Chloride: 102 mEq/L (ref 96–112)
Creatinine, Ser: 0.6 mg/dL (ref 0.50–1.10)
GFR calc Af Amer: >90 mL/min (ref >90)
GFR calc non Af Amer: >90 mL/min (ref >90)

## 2012-10-16 LAB — FACTOR 5 LEIDEN

## 2012-10-16 LAB — BETA-2-GLYCOPROTEIN I ABS, IGG/M/A
Beta-2 Glyco I IgG: 5 G Units (ref <20)
Beta-2-Glycoprotein I IgA: 20 A Units — ABNORMAL HIGH (ref <20)
Beta-2-Glycoprotein I IgM: 3 M Units (ref <20)

## 2012-10-16 LAB — CARDIOLIPIN ANTIBODIES, IGG, IGM, IGA
Anticardiolipin IgA: 3 APL U/mL — ABNORMAL LOW (ref <22)
Anticardiolipin IgG: 5 GPL U/mL — ABNORMAL LOW (ref <23)
Anticardiolipin IgM: 2 MPL U/mL — ABNORMAL LOW (ref <11)

## 2012-10-16 LAB — PROTEIN S, TOTAL: Protein S Ag, Total: 64 % (ref 60–150)

## 2012-10-16 LAB — PROTEIN C ACTIVITY: Protein C Activity: 81 % (ref 75–133)

## 2012-10-16 MED ORDER — ALPRAZOLAM 0.5 MG PO TABS
0.5000 mg | ORAL_TABLET | Freq: Four times a day (QID) | ORAL | Status: DC | PRN
  Administered 2012-10-16 – 2012-10-18 (×3): 0.5 mg via ORAL
  Filled 2012-10-16 (×3): qty 1

## 2012-10-16 MED ORDER — ASPIRIN 81 MG PO CHEW
CHEWABLE_TABLET | ORAL | Status: AC
  Filled 2012-10-16: qty 1

## 2012-10-16 MED ORDER — TEMAZEPAM 7.5 MG PO CAPS
30.0000 mg | ORAL_CAPSULE | Freq: Every evening | ORAL | Status: DC | PRN
  Administered 2012-10-16 – 2012-10-18 (×3): 30 mg via ORAL
  Filled 2012-10-16 (×3): qty 4

## 2012-10-16 NOTE — Progress Notes (Signed)
Report called to [id number]. Past medical history and recent hospitalization progression reported to receiving nurse, Lawanna Kobus, RN. All questions answered at this time. Husband at bedside aware of transfer and room number. All belongings transferred with patient. Dr. Jomarie Longs aware of transfer.

## 2012-10-16 NOTE — Evaluation (Signed)
Physical Therapy Evaluation Patient Details Name: [name] MRN: [id number] DOB: 1946-02-24 Today's Date: 10/16/2012 Time: 1208-1225 PT Time Calculation (min): 17 min  PT Assessment / Plan / Recommendation History of Present Illness  [name] is a 67 y.o. female with Past medical history of polycythemia vera, hypertension, fibromyalgia, hypothyroidism who was recently admitted for acute cholecystitis and had an acute watershed stroke as well, now coming in with 9 episode of recurrent seizures.[name] RUE weakness with distal greater than proximal. Due to polychethemia vera Hbg goal is 6  Clinical Impression  Pt very pleasant and appreciative of getting out of bed. Pt states she feels her RUE is currently weaker than last admission. Pt educated for positioning and assisted movement of RUE. Pt with decreased activity tolerance and function and will benefit from acute therapy to maximize function and independence prior to discharge with spouse.    PT Assessment  Patient needs continued PT services    Follow Up Recommendations  Outpatient PT    Does the patient have the potential to tolerate intense rehabilitation      Barriers to Discharge        Equipment Recommendations  None recommended by PT    Recommendations for Other Services     Frequency Min 3X/week    Precautions / Restrictions Precautions Precautions: Fall   Pertinent Vitals/Pain No pain sats 98% on RA      Mobility  Bed Mobility Supine to Sit: HOB flat;4: Min guard Sitting - Scoot to Edge of Bed: 4: Min guard Details for Bed Mobility Assistance: guarding for lines and safety Transfers Sit to Stand: 4: Min guard;From bed Stand to Sit: 4: Min guard;To chair/3-in-1 Details for Transfer Assistance: minguard for safety Ambulation/Gait Ambulation/Gait Assistance: 5: Supervision Ambulation Distance (Feet): 40 Feet Assistive device: None Ambulation/Gait Assistance Details: pt denied further ambulation due to  fatigue. Pt running into obstacles to right x 2 with gait although states she is aware and can see them did not avoid Gait Pattern: Step-through pattern;Decreased stride length Gait velocity: decreased Stairs: No Modified Rankin (Stroke Patients Only) Pre-Morbid Rankin Score: Moderate disability Modified Rankin: Moderately severe disability    Exercises     PT Diagnosis: Difficulty walking  PT Problem List: Decreased strength;Decreased activity tolerance PT Treatment Interventions: Gait training;Therapeutic activities;Functional mobility training;Patient/family education     PT Goals(Current goals can be found in the care plan section) Acute Rehab PT Goals Patient Stated Goal: return home and crochet PT Goal Formulation: With patient Time For Goal Achievement: 10/30/12 Potential to Achieve Goals: Good  Visit Information  Last PT Received On: 10/16/12 Assistance Needed: +1 History of Present Illness: [name] is a 68 y.o. female with Past medical history of polycythemia vera, hypertension, fibromyalgia, hypothyroidism who was recently admitted for acute cholecystitis and had an acute watershed stroke as well, now coming in with 9 episode of recurrent seizures.[name] RUE weakness with distal greater than proximal. Due to polychethemia vera Hbg goal is 6       Prior Functioning  Home Living Family/patient expects to be discharged to:: Private residence Living Arrangements: Spouse/significant other Available Help at Discharge: Family;Available 24 hours/day Type of Home: House Home Access: Level entry Entrance Stairs-Number of Steps: 2 Entrance Stairs-Rails: Right;Left Home Layout: One level Home Equipment: Walker - 4 wheels;Bedside commode;Shower seat;Wheelchair - [employer] - single point  Lives With: Spouse Prior Function Level of Independence: Independent Communication Communication: Expressive difficulties Dominant Hand: Right    Cognition   Cognition Arousal/Alertness:  Awake/alert Behavior During Therapy: WFL for tasks assessed/performed Overall Cognitive Status: Within Functional Limits for tasks assessed    Extremity/Trunk Assessment Upper Extremity Assessment Upper Extremity Assessment: RUE deficits/detail RUE Deficits / Details: shoulder grossly 3+/5, elbow flexion 3/5, pt unable to supinate 0/5 , flex or extend wrist or fingers 0/5 Lower Extremity Assessment Lower Extremity Assessment: Overall WFL for tasks assessed RLE Deficits / Details: 4-5/5 overall Cervical / Trunk Assessment Cervical / Trunk Assessment: Normal   Balance Balance Balance Assessed: Yes Static Sitting Balance Static Sitting - Balance Support: No upper extremity supported;Feet supported Static Sitting - Level of Assistance: 7: Independent Static Standing Balance Static Standing - Balance Support: No upper extremity supported Static Standing - Level of Assistance: 5: Stand by assistance Static Standing - Comment/# of Minutes: 1  End of Session PT - End of Session Activity Tolerance: Patient limited by fatigue Patient left: in chair;with call bell/phone within reach Nurse Communication: Mobility status  GP     Delorse Lek 10/16/2012, 12:33 PM Delaney Meigs, PT [phone]

## 2012-10-16 NOTE — Telephone Encounter (Signed)
Called patient to advise message forwarded to schedule with NP/Sethi. No answer. No vmail left.

## 2012-10-16 NOTE — Progress Notes (Signed)
UR Completed.  Abir Eroh [name] [phone] 10/16/2012  

## 2012-10-16 NOTE — Progress Notes (Signed)
[facility] PROGRESS NOTE  [name] ZOX:[id number] DOB: [birth year] DOA: 10/14/2012 PCP: [name], MD  Assessment/Plan: 1. Seizure: -no further seizures -on Keppra, Neuro following -Her CT head reveal progressive infarct  -MRI pending Seizure precaution  Aspiration and Fall precaution.   2. Recent CVA with continuing progression  -Neuro following -continue ASA/Palvix/Statin -MRI pending Permissive hypertension   3. Polycythemia Vera  -s/p PICC and Phlebotomy yetserday -goal Hb per dr.Ennever <6 -Greatly appreciate Dr.Ennevers input  3.Fibromyalgia  -stable on Oxycodone and flexeril .   5. hypothyroidism  continue synthroid  6. Recent Cholecystitis  -s/p Per chole drain last admission -stable, Fu with CCS   Tx to tele Ambulate Pt/Ot  DVT Prophylaxis: Lovenox 40 mg Q24 hrs  Code Status: FUll Family Communication: none at bedside Disposition Plan: Tx to Neuro Tele  Consultants:  Dr.Ennever  Neuro  Procedures:  Phlebotomy  PICC  HPI/Subjective: Feels weak  Objective: Filed Vitals:   10/16/12 0450 10/16/12 0500 10/16/12 0600 10/16/12 0700  BP:  121/47 126/57 131/46  Pulse:  77 80 78  Temp: 98.3 F (36.8 C)     TempSrc: Oral     Resp:  16 15 16   Height:      Weight:      SpO2:  94% 94% 95%    Intake/Output Summary (Last 24 hours) at 10/16/12 0815 Last data filed at 10/16/12 0500  Gross per 24 hour  Intake    580 ml  Output   2150 ml  Net  -1570 ml   Filed Weights   10/15/12 0200  Weight: 73.9 kg (162 lb 14.7 oz)    Exam:   General:  AAOx3  Cardiovascular: S1S2/RRR  Respiratory: CTAB  Abdomen:soft, Nt, BS present, drain RUQ  Musculoskeletal:no edema c/c  NEuro: RUE weakness improving, now 3+/5   Data Reviewed: Basic Metabolic Panel:  Recent Labs Lab 10/10/12 0550 10/11/12 0434 10/14/12 2103 10/14/12 2201 10/16/12 0400  NA 136 134* 134* 138 137  K 4.5 4.7 4.4 4.6 3.6  CL 98 102 99 101 102  CO2  32 27 27  --  27  GLUCOSE 95 107* 97 98 99  BUN 6 8 9 8 6   CREATININE 0.69 0.64 0.71 0.80 0.60  CALCIUM 8.6 8.4 9.0  --  8.8   Liver Function Tests:  Recent Labs Lab 10/14/12 2103  AST 20  ALT 20  ALKPHOS 101  BILITOT 0.7  PROT 7.0  ALBUMIN 3.2*   No results found for this basename: LIPASE, AMYLASE,  in the last 168 hours No results found for this basename: AMMONIA,  in the last 168 hours CBC:  Recent Labs Lab 10/11/12 0434 10/12/12 0810 10/14/12 2103 10/14/12 2201 10/15/12 0825 10/16/12 0400  WBC 6.8 8.1 6.4  --  6.0 5.5  NEUTROABS  --   --  4.9  --   --   --   HGB 6.1* 6.4* 7.7* 9.5* 6.7* 6.4*  HCT 22.8* 24.4* 29.0* 28.0* 25.3* 24.2*  MCV 65.5* 66.8* 66.5*  --  66.4* 65.6*  PLT 254 283 390  --  319 378   Cardiac Enzymes: No results found for this basename: CKTOTAL, CKMB, CKMBINDEX, TROPONINI,  in the last 168 hours BNP (last 3 results)  Recent Labs  10/05/12 0455  PROBNP 2326.0*   CBG:  Recent Labs Lab 10/15/12 0724 10/15/12 1129 10/15/12 1932 10/15/12 2349 10/16/12 0358  GLUCAP 81 115* 130* 93 87    Recent Results (from the past 240 hour(s))  MRSA PCR SCREENING     Status: None   Collection Time    10/15/12  2:11 AM      Result Value Range Status   MRSA by PCR NEGATIVE  NEGATIVE Final   Comment:            The GeneXpert MRSA Assay (FDA     approved for NASAL specimens     only), is one component of a     comprehensive MRSA colonization     surveillance program. It is not     intended to diagnose MRSA     infection nor to guide or     monitor treatment for     MRSA infections.     Studies: Ct Head Wo Contrast  10/14/2012   *RADIOLOGY REPORT*  Clinical Data: Throbbing and cramping pain in right arm, shoulder, neck, head and right side of the face.  CT HEAD WITHOUT CONTRAST  Technique:  Contiguous axial images were obtained from the base of the skull through the vertex without contrast.  Comparison: Maxillofacial CT performed 05/09/2003,  and MRI/MRA of the brain performed 10/05/2012  Findings:   Evolving acute infarct is noted within the left frontal and parietal lobes, more diffuse than seen on prior MRI, though there is still relative sparing of the overlying gray matter. There is no evidence for hemorrhagic transformation at this time. No significant mass effect or midline shift is seen.  Scattered periventricular and subcortical white matter change likely reflects small vessel ischemic microangiopathy.  The posterior fossa, including the cerebellum, brainstem and fourth ventricle, is within normal limits.  The third and lateral ventricles, and basal ganglia are unremarkable in appearance.  There is no evidence of fracture; visualized osseous structures are unremarkable in appearance.  The orbits are within normal limits. The paranasal sinuses and mastoid air cells are well-aerated.  No significant soft tissue abnormalities are seen.  IMPRESSION:  1.  Evolving acute infarct within the left frontal and parietal lobes, more diffuse than on the prior MRI, though there is still relative sparing of the overlying gray matter.  No evidence for hemorrhagic transformation at this time.  No mass effect or midline shift seen. 2.  Scattered small vessel ischemic microangiopathy noted.  These results were called by telephone on 10/14/2012 at 10:17 p.m. to Dr. Audelia Hives, who verbally acknowledged these results.   Original Report Authenticated By: Tonia Ghent, M.D.    Scheduled Meds: . aspirin  325 mg Oral QPM  . bisoprolol  10 mg Oral Daily  . clopidogrel  75 mg Oral Daily  . docusate sodium  100 mg Oral TID  . enoxaparin (LOVENOX) injection  40 mg Subcutaneous Q24H  . famotidine  20 mg Oral BID  . Ipratropium-Albuterol  2 puff Inhalation Q6H  . levETIRAcetam  500 mg Oral BID  . loratadine  10 mg Oral Daily  . mineral oil  30 mL Oral QHS  . OxyCODONE  20 mg Oral Q8H  . senna  2 tablet Oral BID  . sodium chloride  10 mL Intracatheter  Q12H  . sodium chloride  10-40 mL Intracatheter Q12H  . thyroid  150 mg Oral QAC breakfast   Continuous Infusions:   Principal Problem:   Complex partial seizure disorder Active Problems:   Hypothyroidism   Polycythemia vera(238.4)   Unspecified constipation   CVA (cerebral infarction)   Acute cholecystitis    Time spent:    [facility]  [facility] Pager [phone]. If 7PM-7AM, please  contact night-coverage at [email or web address], password [place] 10/16/2012, 8:15 AM  LOS: 2 days

## 2012-10-16 NOTE — Progress Notes (Addendum)
NEURO HOSPITALIST PROGRESS NOTE   SUBJECTIVE:                                                                                                                        No complaints, no further seizures.  Continues to have right arm weakness.   OBJECTIVE:                                                                                                                           Vital signs in last 24 hours: Temp:  [98.2 F (36.8 C)-98.5 F (36.9 C)] 98.3 F (36.8 C) (07/21 1226) Pulse Rate:  [66-91] 67 (07/21 1300) Resp:  [14-27] 19 (07/21 1300) BP: (106-144)/(28-75) 124/48 mmHg (07/21 1300) SpO2:  [94 %-100 %] 99 % (07/21 1300)  Intake/Output from previous day: 07/20 0701 - 07/21 0700 In: 580 [P.O.:480; IV Piggyback:100] Out: 2150 [Urine:1450; Blood:500] Intake/Output this shift: Total I/O In: 200 [P.O.:200] Out: 550 [Urine:300; Drains:250] Nutritional status: Cardiac  Past Medical History  Diagnosis Date  . 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 Fibromyalgia   . Hypothyroidism   . Essential hypertension   . Postmenopausal state     on hormone replacement therapy  . Aortic valve sclerosis     by echocardiogram 12/04/2009  . PAC (premature atrial contraction)     history of  . Coronary artery disease   . Hyperlipidemia   . History of echocardiogram 12/04/2009    showed mild ventricle hypertrophy and normal EF at 55-60% -- mild aortic valve sclerosis  -- trace mitral regurgitation -- slightly pulmonary artery pressure at 47  --  [name], M.D.   . Fatigue      Neurologic Exam:  Mental Status: Alert, oriented, thought content appropriate.  Speech fluent without evidence of aphasia.  Able to follow 3 step commands without difficulty. Cranial Nerves: II: Discs flat bilaterally; Visual fields grossly normal, pupils equal, round, reactive to light and accommodation III,IV, VI: ptosis not present, extra-ocular  motions intact bilaterally V,VII: smile symmetric, decreased right NL fold at rest, facial light touch sensation normal bilaterally VIII: hearing normal bilaterally IX,X: gag reflex present XI: bilateral shoulder shrug XII: midline tongue extension Motor: Right : Upper extremity  3/5    Left:     Upper extremity   5/5  Lower extremity   5/5     Lower extremity   5/5 Tone and bulk:normal tone throughout; no atrophy noted Sensory: Pinprick and light touch intact throughout, bilaterally Deep Tendon Reflexes:  Right: Upper Extremity   Left: Upper extremity   biceps (C-5 to C-6) 2/4   biceps (C-5 to C-6) 2/4 tricep (C7) 2/4    triceps (C7) 2/4 Brachioradialis (C6) 2/4  Brachioradialis (C6) 2/4  Lower Extremity Lower Extremity  quadriceps (L-2 to L-4) 1/4   quadriceps (L-2 to L-4) 1/4 Achilles (S1) 0/4   Achilles (S1) 0/4  Plantars: Right: upgoing   Left: downgoing    Lab Results: Lab Results  Component Value Date/Time   CHOL 58 10/07/2012  7:00 AM   Lipid Panel No results found for this basename: CHOL, TRIG, HDL, CHOLHDL, VLDL, LDLCALC,  in the last 72 hours  Studies/Results: Ct Head Wo Contrast  10/14/2012   *RADIOLOGY REPORT*  Clinical Data: Throbbing and cramping pain in right arm, shoulder, neck, head and right side of the face.  CT HEAD WITHOUT CONTRAST  Technique:  Contiguous axial images were obtained from the base of the skull through the vertex without contrast.  Comparison: Maxillofacial CT performed 05/09/2003, and MRI/MRA of the brain performed 10/05/2012  Findings:   Evolving acute infarct is noted within the left frontal and parietal lobes, more diffuse than seen on prior MRI, though there is still relative sparing of the overlying gray matter. There is no evidence for hemorrhagic transformation at this time. No significant mass effect or midline shift is seen.  Scattered periventricular and subcortical white matter change likely reflects small vessel ischemic  microangiopathy.  The posterior fossa, including the cerebellum, brainstem and fourth ventricle, is within normal limits.  The third and lateral ventricles, and basal ganglia are unremarkable in appearance.  There is no evidence of fracture; visualized osseous structures are unremarkable in appearance.  The orbits are within normal limits. The paranasal sinuses and mastoid air cells are well-aerated.  No significant soft tissue abnormalities are seen.  IMPRESSION:  1.  Evolving acute infarct within the left frontal and parietal lobes, more diffuse than on the prior MRI, though there is still relative sparing of the overlying gray matter.  No evidence for hemorrhagic transformation at this time.  No mass effect or midline shift seen. 2.  Scattered small vessel ischemic microangiopathy noted.  These results were called by telephone on 10/14/2012 at 10:17 p.m. to Dr. Audelia Hives, who verbally acknowledged these results.   Original Report Authenticated By: Tonia Ghent, M.D.   Mr Brain Wo Contrast  10/16/2012   *RADIOLOGY REPORT*  Clinical Data: Recent stroke.  Recurrent seizures.  MRI HEAD WITHOUT CONTRAST  Technique:  Multiplanar, multiecho pulse sequences of the brain and surrounding structures were obtained according to standard protocol without intravenous contrast.  Comparison: Head CT 10/14/2012.  MRI 10/05/2012.  Findings: Diffusion imaging shows some residual signal within the areas of acute infarction previously seen in the left hemisphere primarily in the frontal and to a lesser extent in the parietal region affecting the cortical brain and underlying white matter. There is no evidence of new acute infarction.  However, the patient has developed new regions of cortical and subcortical edema in the left frontal and parietal region since 2 days ago.  In the setting of seizure, this may be postictal change.  If this were acute infarction, it would be  expected to show restricted diffusion, which it  largely does not.  The differential diagnosis includes cerebritis.  There are some minimal petechial blood products in any left frontal region.  No frank hematoma.  No mass effect or shift. No hydrocephalus or extra-axial fluid collection.  IMPRESSION: Development of new edema signal within the left frontal and parietal cortical and subcortical brain, without accompanying restricted diffusion.  Therefore, this probably represents postictal change given the history of recurrent seizures.  There are expected evolutionary changes occurring within the areas of watershed infarction of the left hemisphere.  No new restricted diffusion/acute infarction.   Original Report Authenticated By: Paulina Fusi, M.D.    MEDICATIONS                                                                                                                        Scheduled: . aspirin  325 mg Oral QPM  . bisoprolol  10 mg Oral Daily  . clopidogrel  75 mg Oral Daily  . docusate sodium  100 mg Oral TID  . enoxaparin (LOVENOX) injection  40 mg Subcutaneous Q24H  . famotidine  20 mg Oral BID  . Ipratropium-Albuterol  2 puff Inhalation Q6H  . levETIRAcetam  500 mg Oral BID  . loratadine  10 mg Oral Daily  . mineral oil  30 mL Oral QHS  . OxyCODONE  20 mg Oral Q8H  . senna  2 tablet Oral BID  . sodium chloride  10 mL Intracatheter Q12H  . sodium chloride  10-40 mL Intracatheter Q12H  . thyroid  150 mg Oral QAC breakfast    ASSESSMENT/PLAN:                                                                                                            seizure:  Patient has  Remained without seizure since on Keppra. MRI shows no new ischemic event but does show left frontal and parietal cortical and subcortical brain which is likely secondary to seizure or reperfusion from CEA.    Recommend: 1) continue Keppra 500 mg BID 2) Follow up with neurology as out patient 3) No driving, operating heavy machinery, perform activities at heights,  swimming or participation in water activities until release by outpatient physician.  This has been discussed with patient.    No further recommendations. Neurology S/O  Assessment and plan discussed with with attending physician and they are in agreement.    Felicie Morn PA-C [facility] [phone]  10/16/2012, 3:14 PM

## 2012-10-16 NOTE — Progress Notes (Signed)
Mrs. Krabill is to be this morning. She was phlebotomized. Her PICC line is in. Apparently, it was still very difficult to phlebotomize her as her blood was quite thick. She is on aspirin and Plavix.  She sees her a little more strength in the right arm. Still no strength in the right hand. Her voice sounds stronger.  I appreciate the input from Dr. Thad Ranger of neurology.  She's had no further seizures. She is on KEPPRA.    Hopefully, she'll be moved to the neurology floor today. I'm not sure if an EEG will need to be done. She may have an MRI if not already.  On her physical exam, vital signs look stable. Blood pressure 131/46. Temperature 98.3. Heart rate is regular at 78. She is a good oxygen saturation. Her lungs are clear. Cardiac exam regular rate and rhythm with no murmurs rubs or bruits. Abdomen is soft. Neurological exam shows the right upper extremity weakness. There are no cranial nerve deficits.  Her last her white cell count 5.5 hemoglobin is 6.4. Platelet count is 378.  Anti-thrombin 3 with 79%. Homocystine level was 7.5.  Potassium 3.6.  We will continue to follow her along. She is on complete platelet "blockade" with aspirin and Plavix.  Hopefully, physical therapy we'll start today. She may need inpatient rehabilitation.   Lawerance Sabal 1:5-7

## 2012-10-17 ENCOUNTER — Encounter: Payer: Self-pay | Admitting: Nurse Practitioner

## 2012-10-17 ENCOUNTER — Other Ambulatory Visit: Payer: Self-pay | Admitting: *Deleted

## 2012-10-17 ENCOUNTER — Encounter (INDEPENDENT_AMBULATORY_CARE_PROVIDER_SITE_OTHER): Payer: Medicare Other | Admitting: General Surgery

## 2012-10-17 DIAGNOSIS — R569 Unspecified convulsions: Secondary | ICD-10-CM

## 2012-10-17 DIAGNOSIS — K5900 Constipation, unspecified: Secondary | ICD-10-CM

## 2012-10-17 LAB — CBC
HCT: 22.7 % — ABNORMAL LOW (ref 36.0–46.0)
Hemoglobin: 5.9 g/dL — CL (ref 12.0–15.0)
MCH: 17.3 pg — ABNORMAL LOW (ref 26.0–34.0)
MCHC: 26 g/dL — ABNORMAL LOW (ref 30.0–36.0)
MCV: 66.6 fL — ABNORMAL LOW (ref 78.0–100.0)
RBC: 3.41 MIL/uL — ABNORMAL LOW (ref 3.87–5.11)

## 2012-10-17 LAB — BASIC METABOLIC PANEL
BUN: 6 mg/dL (ref 6–23)
CO2: 28 mEq/L (ref 19–32)
Chloride: 105 mEq/L (ref 96–112)
GFR calc non Af Amer: >90 mL/min (ref >90)
Glucose, Bld: 95 mg/dL (ref 70–99)
Potassium: 3.5 mEq/L (ref 3.5–5.1)
Sodium: 138 mEq/L (ref 135–145)

## 2012-10-17 MED ORDER — FUROSEMIDE 40 MG PO TABS
40.0000 mg | ORAL_TABLET | Freq: Every day | ORAL | Status: DC
  Administered 2012-10-17 – 2012-10-19 (×3): 40 mg via ORAL
  Filled 2012-10-17 (×4): qty 1

## 2012-10-17 MED ORDER — POLYETHYLENE GLYCOL 3350 17 G PO PACK
17.0000 g | PACK | Freq: Two times a day (BID) | ORAL | Status: DC
  Administered 2012-10-17 – 2012-10-19 (×4): 17 g via ORAL
  Filled 2012-10-17 (×6): qty 1

## 2012-10-17 MED ORDER — IPRATROPIUM-ALBUTEROL 20-100 MCG/ACT IN AERS
2.0000 | INHALATION_SPRAY | Freq: Four times a day (QID) | RESPIRATORY_TRACT | Status: DC | PRN
  Administered 2012-10-17: 2 via RESPIRATORY_TRACT
  Filled 2012-10-17 (×2): qty 4

## 2012-10-17 NOTE — Evaluation (Signed)
I agree with the following treatment note after reviewing documentation.   Johnston, Tommy Goostree Brynn   OTR/L Pager: [phone] Office: [phone] .   

## 2012-10-17 NOTE — Evaluation (Signed)
Occupational Therapy Evaluation Patient Details Name: [name] MRN: [id number] DOB: 03/22/1946 Today's Date: 10/17/2012 Time: 4098-1191 OT Time Calculation (min): 38 min  OT Assessment / Plan / Recommendation History of present illness [name] is a 67 y.o. female with Past medical history of polycythemia vera, hypertension, fibromyalgia, hypothyroidism who was recently admitted for acute cholecystitis and had an acute watershed stroke as well, now coming in with 9 episode of recurrent seizures.Marland Kitchen RUE weakness with distal greater than proximal. Due to polychethemia vera Hbg goal is 6   Clinical Impression   PT admitted with seizures and worsening of left sided weakness. Pt currently with functional limitiations due to the deficits listed below (see OT problem list). Pt will benefit from skilled OT to increase their independence and safety with adls, RUE function, and balance to allow discharge home with OPOT.     OT Assessment  Patient needs continued OT Services    Follow Up Recommendations  Outpatient OT       Equipment Recommendations  None recommended by OT       Frequency  Min 3X/week    Precautions / Restrictions Precautions Precautions: Fall Restrictions Weight Bearing Restrictions: No   Pertinent Vitals/Pain Pt reported no pain during session.    ADL  Eating/Feeding: Moderate assistance Where Assessed - Eating/Feeding: Chair Grooming: Wash/dry hands;Moderate assistance Where Assessed - Grooming: Unsupported standing Upper Body Bathing: Minimal assistance Where Assessed - Upper Body Bathing: Unsupported sitting Lower Body Bathing: Minimal assistance Where Assessed - Lower Body Bathing: Supported sit to stand Upper Body Dressing: Moderate assistance Where Assessed - Upper Body Dressing: Unsupported sitting Lower Body Dressing: Minimal assistance Where Assessed - Lower Body Dressing: Unsupported sitting Toilet Transfer: Min guard Toilet Transfer Method:  Sit to Barista: Comfort height toilet Toileting - Clothing Manipulation and Hygiene: Moderate assistance Where Assessed - Toileting Clothing Manipulation and Hygiene: Sit to stand from 3-in-1 or toilet Tub/Shower Transfer: Modified independent Tub/Shower Transfer Method: Ambulating Tub/Shower Transfer Equipment: Walk in shower;Grab bars Equipment Used: Gait belt Transfers/Ambulation Related to ADLs: Min assist for ambulation.  Assist for steadying and balance due to tendency to veer to right side. ADL Comments: Pt with difficulty engaging RUE in functional tasks. Pt able to wash hands, but unable to use RUE to hold utensil for eating/cutting up food. Pt reported that her grip is better than one hour before session, and ROM has improved. Pt walking hand held assist for stability.    OT Diagnosis: Generalized weakness;Hemiplegia dominant side  OT Problem List: Decreased strength;Impaired balance (sitting and/or standing);Decreased activity tolerance;Decreased safety awareness;Decreased knowledge of use of DME or AE;Impaired UE functional use;Impaired sensation OT Treatment Interventions: Self-care/ADL training;DME and/or AE instruction;Therapeutic activities;Patient/family education;Balance training;Energy conservation   OT Goals(Current goals can be found in the care plan section) Acute Rehab OT Goals Patient Stated Goal: return home and crochet OT Goal Formulation: With patient Time For Goal Achievement: 10/21/12 Potential to Achieve Goals: Good ADL Goals Pt Will Perform Grooming: with set-up;standing Pt Will Perform Upper Body Dressing: with supervision;with caregiver independent in assisting;sitting Pt Will Perform Lower Body Dressing: with set-up;with caregiver independent in assisting;sit to/from stand  Visit Information  Last OT Received On: 10/17/12 Assistance Needed: +1 History of Present Illness: [name] is a 67 y.o. female with Past medical history  of polycythemia vera, hypertension, fibromyalgia, hypothyroidism who was recently admitted for acute cholecystitis and had an acute watershed stroke as well, now coming in with 9 episode of recurrent  seizures.Marland Kitchen RUE weakness with distal greater than proximal. Due to polychethemia vera Hbg goal is 6       Prior Functioning     Home Living Family/patient expects to be discharged to:: Private residence Living Arrangements: Spouse/significant other Available Help at Discharge: Family;Available 24 hours/day Type of Home: House Home Access: Level entry Entrance Stairs-Number of Steps: 2 Entrance Stairs-Rails: Right;Left Home Layout: One level Home Equipment: Walker - 4 wheels;Bedside commode;Shower seat;Wheelchair - manual;Cane - single point Additional Comments: husnamd uses rollator walker for long distance mobility  Lives With: Spouse Prior Function Level of Independence: Independent Communication Communication: Expressive difficulties Dominant Hand: Right         Vision/Perception Vision - History Baseline Vision: Wears glasses for distance only Visual History: Cataracts Patient Visual Report: No change from baseline Vision - Assessment Eye Alignment: Within Functional Limits Vision Assessment: Vision not tested Additional Comments: Per Pt since may, she used to be far sighted and need reading glasses, but then it changed and she has trouble seeing distance. Pt has opthomologist and checking in with her for new perscription.   Cognition  Cognition Arousal/Alertness: Awake/alert Behavior During Therapy: WFL for tasks assessed/performed Overall Cognitive Status: Within Functional Limits for tasks assessed    Extremity/Trunk Assessment Upper Extremity Assessment Upper Extremity Assessment: RUE deficits/detail RUE Deficits / Details: Pt able to abuduct at shoulder 90 degrees, bring hands up to touch back of head and small of back. Pt able to make a loose fist, but unable to  re-open. Pt reported sensation felt like tingling all through arm. RUE Sensation: decreased light touch;decreased proprioception RUE Coordination: decreased fine motor;decreased gross motor Lower Extremity Assessment Lower Extremity Assessment: Defer to PT evaluation     Mobility Bed Mobility Bed Mobility: Rolling Right;Right Sidelying to Sit;Sitting - Scoot to Edge of Bed Rolling Right: 4: Min guard;With rail Right Sidelying to Sit: 4: Min guard;HOB flat;With rails Sitting - Scoot to Edge of Bed: 4: Min guard Details for Bed Mobility Assistance: moves generally well, does not use R UE to assist spontaneously Transfers Transfers: Sit to Stand;Stand to Sit Sit to Stand: 4: Min guard;With upper extremity assist;From bed Stand to Sit: 4: Min guard;To chair/3-in-1 Details for Transfer Assistance: minguard for safety           End of Session OT - End of Session Equipment Utilized During Treatment: Gait belt Activity Tolerance: Patient tolerated treatment well Patient left: in chair;with call bell/phone within reach;with family/visitor present Nurse Communication: Mobility status  GO     Sherryl Manges 10/17/2012, 1:32 PM

## 2012-10-17 NOTE — Progress Notes (Addendum)
[facility] PROGRESS NOTE  [name]:[id number] DOB: Nov 13, 1945 DOA: 10/14/2012 PCP: [name], MD Brief narrative: [name] is a 67/F history of polycythemia vera with Hyperviscosity syndrome, hypertension, fibromyalgia, hypothyroidism who was recently admitted for acute cholecystitis and had an acute stroke as well, now coming in with 9 episode of recurrent seizures. The longest episode was in the ED which lasted for 25 minutes, in additon she had worsening R arm weakness, Has been followed by Neurology and Heme Dr.Ennever. Seizure stable without further events on Keppra. Workup as detailed below. Also had phlebotomy after PICC placed for hyperviscosity syndrome for Hb >6.5. Goal Hb is <6.5  Assessment/Plan: 1. Seizure: -no further seizures -on Keppra, Neuro following -Her CT head reveal progressive infarct  -MRI with signal abnormality likely reflecting post ictal change -Seizure precaution   2. Recent CVA with continuing progression  -Neuro following -continue ASA/Palvix/Statin -MRI as noted above -RUE strength improving, Outpatient PT recommended  3. Polycythemia Vera  -s/p PICC and Phlebotomy  -goal Hb per dr.Ennever <6/6.5 -Greatly appreciate Dr.Ennevers input -I called and D/w Dr.Ennever today, Dc home tomorrow if Hb <6.5  3.Fibromyalgia  -stable on Oxycodone and flexeril .   5. hypothyroidism  continue synthroid  6. Recent Cholecystitis  -s/p Per chole drain last admission -stable, Fu with CCS   7. Constipation -add miralax, continue senokot  Ambulate Pt/Ot  DVT Prophylaxis: Lovenox 40 mg Q24 hrs  Code Status: FUll Family Communication: d/w pt and husband at bedside Disposition Plan: home tomorrow per Dr.Ennever if  Hb at goal(<6.5).  Consultants:  Dr.Ennever  Neuro  Procedures:  Phlebotomy  PICC  HPI/Subjective: Feels weak, RUE better, wants to shower  Objective: Filed Vitals:   10/17/12 0200 10/17/12 0600  10/17/12 0930 10/17/12 1024  BP: 145/56 145/56 126/56   Pulse: 75 72 88   Temp: 97.6 F (36.4 C) 98 F (36.7 C) 98.1 F (36.7 C)   TempSrc:   Oral   Resp: 18 20 18    Height:      Weight:      SpO2: 98% 97% 99% 97%    Intake/Output Summary (Last 24 hours) at 10/17/12 1107 Last data filed at 10/16/12 1151  Gross per 24 hour  Intake    100 ml  Output    350 ml  Net   -250 ml   Filed Weights   10/15/12 0200  Weight: 73.9 kg (162 lb 14.7 oz)    Exam:   General:  AAOx3  Cardiovascular: S1S2/RRR  Respiratory: CTAB  Abdomen:soft, Nt, BS present, drain RUQ  Musculoskeletal:no edema c/c  NEuro: RUE weakness improving, now 3+/5, hand grip still poor  Data Reviewed: Basic Metabolic Panel:  Recent Labs Lab 10/11/12 0434 10/14/12 2103 10/14/12 2201 10/16/12 0400 10/17/12 0459  NA 134* 134* 138 137 138  K 4.7 4.4 4.6 3.6 3.5  CL 102 99 101 102 105  CO2 27 27  --  27 28  GLUCOSE 107* 97 98 99 95  BUN 8 9 8 6 6   CREATININE 0.64 0.71 0.80 0.60 0.57  CALCIUM 8.4 9.0  --  8.8 8.7   Liver Function Tests:  Recent Labs Lab 10/14/12 2103  AST 20  ALT 20  ALKPHOS 101  BILITOT 0.7  PROT 7.0  ALBUMIN 3.2*   No results found for this basename: LIPASE, AMYLASE,  in the last 168 hours No results found for this basename: AMMONIA,  in the last 168 hours CBC:  Recent Labs Lab  10/11/12 0434 10/12/12 0810 10/14/12 2103 10/14/12 2201 10/15/12 0825 10/16/12 0400  WBC 6.8 8.1 6.4  --  6.0 5.5  NEUTROABS  --   --  4.9  --   --   --   HGB 6.1* 6.4* 7.7* 9.5* 6.7* 6.4*  HCT 22.8* 24.4* 29.0* 28.0* 25.3* 24.2*  MCV 65.5* 66.8* 66.5*  --  66.4* 65.6*  PLT 254 283 390  --  319 378   Cardiac Enzymes: No results found for this basename: CKTOTAL, CKMB, CKMBINDEX, TROPONINI,  in the last 168 hours BNP (last 3 results)  Recent Labs  10/05/12 0455  PROBNP 2326.0*   CBG:  Recent Labs Lab 10/15/12 1932 10/15/12 2349 10/16/12 0358 10/16/12 0802 10/16/12 1156   GLUCAP 130* 93 87 102* 86    Recent Results (from the past 240 hour(s))  MRSA PCR SCREENING     Status: None   Collection Time    10/15/12  2:11 AM      Result Value Range Status   MRSA by PCR NEGATIVE  NEGATIVE Final   Comment:            The GeneXpert MRSA Assay (FDA     approved for NASAL specimens     only), is one component of a     comprehensive MRSA colonization     surveillance program. It is not     intended to diagnose MRSA     infection nor to guide or     monitor treatment for     MRSA infections.     Studies: Mr Brain Wo Contrast  10/16/2012   *RADIOLOGY REPORT*  Clinical Data: Recent stroke.  Recurrent seizures.  MRI HEAD WITHOUT CONTRAST  Technique:  Multiplanar, multiecho pulse sequences of the brain and surrounding structures were obtained according to standard protocol without intravenous contrast.  Comparison: Head CT 10/14/2012.  MRI 10/05/2012.  Findings: Diffusion imaging shows some residual signal within the areas of acute infarction previously seen in the left hemisphere primarily in the frontal and to a lesser extent in the parietal region affecting the cortical brain and underlying white matter. There is no evidence of new acute infarction.  However, the patient has developed new regions of cortical and subcortical edema in the left frontal and parietal region since 2 days ago.  In the setting of seizure, this may be postictal change.  If this were acute infarction, it would be expected to show restricted diffusion, which it largely does not.  The differential diagnosis includes cerebritis.  There are some minimal petechial blood products in any left frontal region.  No frank hematoma.  No mass effect or shift. No hydrocephalus or extra-axial fluid collection.  IMPRESSION: Development of new edema signal within the left frontal and parietal cortical and subcortical brain, without accompanying restricted diffusion.  Therefore, this probably represents postictal  change given the history of recurrent seizures.  There are expected evolutionary changes occurring within the areas of watershed infarction of the left hemisphere.  No new restricted diffusion/acute infarction.   Original Report Authenticated By: Paulina Fusi, M.D.    Scheduled Meds: . aspirin  325 mg Oral QPM  . bisoprolol  10 mg Oral Daily  . clopidogrel  75 mg Oral Daily  . docusate sodium  100 mg Oral TID  . enoxaparin (LOVENOX) injection  40 mg Subcutaneous Q24H  . famotidine  20 mg Oral BID  . furosemide  40 mg Oral Daily  . levETIRAcetam  500 mg Oral BID  .  loratadine  10 mg Oral Daily  . mineral oil  30 mL Oral QHS  . OxyCODONE  20 mg Oral Q8H  . polyethylene glycol  17 g Oral BID  . senna  2 tablet Oral BID  . sodium chloride  10 mL Intracatheter Q12H  . sodium chloride  10-40 mL Intracatheter Q12H  . thyroid  150 mg Oral QAC breakfast   Continuous Infusions:   Principal Problem:   Complex partial seizure disorder Active Problems:   Hypothyroidism   Polycythemia vera(238.4)   Unspecified constipation   CVA (cerebral infarction)   Acute cholecystitis    Time spent:    [facility]  [facility] Pager [phone]. If 7PM-7AM, please contact night-coverage at [email or web address], password [facility] 10/17/2012, 11:07 AM  LOS: 3 days

## 2012-10-18 ENCOUNTER — Encounter (HOSPITAL_COMMUNITY): Payer: Self-pay | Admitting: *Deleted

## 2012-10-18 ENCOUNTER — Telehealth: Payer: Self-pay | Admitting: *Deleted

## 2012-10-18 ENCOUNTER — Encounter: Payer: Self-pay | Admitting: Neurology

## 2012-10-18 DIAGNOSIS — D45 Polycythemia vera: Secondary | ICD-10-CM

## 2012-10-18 DIAGNOSIS — K810 Acute cholecystitis: Secondary | ICD-10-CM

## 2012-10-18 DIAGNOSIS — G40209 Localization-related (focal) (partial) symptomatic epilepsy and epileptic syndromes with complex partial seizures, not intractable, without status epilepticus: Secondary | ICD-10-CM

## 2012-10-18 DIAGNOSIS — T85698A Other mechanical complication of other specified internal prosthetic devices, implants and grafts, initial encounter: Secondary | ICD-10-CM

## 2012-10-18 DIAGNOSIS — I6350 Cerebral infarction due to unspecified occlusion or stenosis of unspecified cerebral artery: Secondary | ICD-10-CM

## 2012-10-18 LAB — CBC
HCT: 21.9 % — ABNORMAL LOW (ref 36.0–46.0)
Hemoglobin: 5.7 g/dL — CL (ref 12.0–15.0)
WBC: 5.4 10*3/uL (ref 4.0–10.5)

## 2012-10-18 LAB — BASIC METABOLIC PANEL
Chloride: 105 mEq/L (ref 96–112)
GFR calc Af Amer: >90 mL/min (ref >90)
Potassium: 3.5 mEq/L (ref 3.5–5.1)

## 2012-10-18 MED ORDER — LEVETIRACETAM 500 MG PO TABS: 500.0000 mg | ORAL_TABLET | Freq: Two times a day (BID) | ORAL | Status: DC

## 2012-10-18 NOTE — Discharge Summary (Signed)
Physician Discharge Summary  [name] YQM:[id number] DOB: 1945/07/09 DOA: 10/14/2012  PCP: [name], MD  Admit date: 10/14/2012 Discharge date: 10/18/2012  Time spent:50 minutes  Recommendations for Outpatient Follow-up:  1. Dr.Ennever in 1 week 2. CBC in 1 week 3. Remove PICC line as outpatient per Dr.Ennevers recommendations  Discharge Diagnoses:  Seizures Hyperviscosity syndrome, GOAL Hb <6.5gm/dl  Polycythemia BMWU(132.4)   Unspecified constipation   CVA (cerebral infarction)   Recent cholecystitis s/p perc chole drain   Anemia   Hypothyroidism   Fibromyalgia   Chronic pain     Discharge Condition: stable  Diet recommendation: low sodium  Filed Weights   10/15/12 0200  Weight: 73.9 kg (162 lb 14.7 oz)    History of present illness:  Chief Complaint: Seizures  HPI: [name] is a 67 y.o. female with Past medical history of polycythemia vera, hypertension, fibromyalgia, hypothyroidism who was recently admitted for acute cholecystitis and had an acute stroke as well, now coming in with 9 episode of recurrent seizures.  As per the husband the pt had started having episodes of shaking of the right elbow and face and eyes turning towards right side last night adn since then she has 8 more similar episodes. The longest episode was in the ED which lasted for 25 minutes resolved with Ativan.      Hospital Course:  Brief narrative: [name] is a 67/F w/ polycythemia vera with Hyperviscosity syndrome, hypertension, fibromyalgia, hypothyroidism who was recently admitted for acute cholecystitis and had an acute stroke as well, admitted 9 episode of recurrent seizures. The longest episode was in the ED which lasted for 25 minutes, in additon she had worsening R arm weakness, Has been followed by Neurology and Heme Dr.Ennever.  Seizure stable without further events on Keppra.  Workup as detailed below.  Also had phlebotomy after PICC placed for hyperviscosity  syndrome for Hb >6.5.  Goal Hb is <6.5   Detailed course below  1. Seizure: -day of admission had 9 seizures  -started on Keppra, Neuro followed as consultants  -no further seizures since  -Her CT head reveal progressive infarct  -MRI with signal abnormality likely reflecting post ictal change  -Seizure precaution   2. Recent CVA with continuing progression  -Seen by NEurology  -continue ASA/Palvix/Statin  -MRI as noted above  -RUE strength improving, Outpatient PT recommended   3. Polycythemia Vera with Hyperviscosity syndrome -s/p PICC and Phlebotomy  -goal Hb per dr.Ennever <6/6.5  -Greatly appreciate Dr.Ennevers input  -I called and D/w Dr.Ennever today, Dc home 7/23 if Hb <6.5, Per Dr.Ennevers DC home with PICC and CBC and FU in 1 week.  3.Fibromyalgia  -stable on Oxycodone and flexeril .   5. hypothyroidism  continue synthroid   6. Recent Cholecystitis  -s/p Per chole drain last admission  -stable, Fu with CCS   7. Constipation  -add miralax, continue senokot      Procedures:  PICC  Phlebotomy  Consultations:  DR.Ennever, Heme  Neurology  Discharge Exam: Filed Vitals:   10/17/12 1840 10/17/12 2200 10/18/12 0200 10/18/12 0600  BP: 119/51 129/54 139/56 127/55  Pulse: 87 90 74 80  Temp: 98.7 F (37.1 C) 98.9 F (37.2 C) 97.4 F (36.3 C) 98.9 F (37.2 C)  TempSrc: Oral Oral Oral Oral  Resp: 18 18 18 18   Height:      Weight:      SpO2: 100% 98% 99% 99%    General: AAOx3 Cardiovascular: S1S2/RRR Respiratory: CTAB  Discharge Instructions  Discharge Orders   Future Appointments Provider Department Dept Phone   10/25/2012 1:00 PM Rachael Fee [facility] [phone]   10/26/2012 10:45 AM Sherren Kerns, MD [facility] [phone]   11/01/2012 3:45 PM [name], MD [facility] Main [facility] [place]) [phone]   11/08/2012 1:45 PM Rachael Fee [facility] [phone]   11/08/2012 2:15 PM Josph Macho, MD [facility] [phone]   11/24/2012 9:30 AM Newt Lukes, MD [facility] [phone]   12/20/2012 2:00 PM Ronal Fear, NP [facility] [phone]   Future Orders Complete By Expires     Diet - low sodium heart healthy  As directed     Increase activity slowly  As directed         Medication List         albuterol-ipratropium 18-103 MCG/ACT inhaler  Commonly known as:  COMBIVENT  Inhale 2 puffs into the lungs every 6 (six) hours as needed for wheezing.     ALPRAZolam 0.5 MG tablet  Commonly known as:  XANAX  Take 0.5 mg by mouth at bedtime.     ANDROXY 10 MG tablet  Generic drug:  fluoxymesterone  Take 2.5 mg by mouth daily as needed (before sexual activity).     aspirin 325 MG tablet  Take 325 mg by mouth every evening.     bisoprolol 10 MG tablet  Commonly known as:  ZEBETA  Take 10 mg by mouth daily.     cholecalciferol 1000 UNITS tablet  Commonly known as:  VITAMIN D  Take 3,000 Units by mouth daily.     clopidogrel 75 MG tablet  Commonly known as:  PLAVIX  Take 75 mg by mouth daily.     cyclobenzaprine 10 MG tablet  Commonly known as:  FLEXERIL  Take 10 mg by mouth 3 (three) times daily as needed for muscle spasms.     diphenhydrAMINE 25 MG tablet  Commonly known as:  BENADRYL  Take 50 mg by mouth every 8 (eight) hours as needed.     docusate sodium 100 MG capsule  Commonly known as:  COLACE  Take 100 mg by mouth 3 (three) times daily.     famotidine 20 MG tablet  Commonly known as:  PEPCID  Take 1 tablet (20 mg total) by mouth 2 (two) times daily.     fexofenadine 180 MG tablet  Commonly known as:  ALLEGRA  Take 180 mg by mouth daily.     furosemide 40 MG tablet  Commonly known as:  LASIX  Take 80 mg by mouth daily.     Garlic Oil 1000 MG Caps  Take 1 capsule by mouth daily.      levETIRAcetam 500 MG tablet  Commonly known as:  KEPPRA  Take 1 tablet (500 mg total) by mouth 2 (two) times daily.     lidocaine-prilocaine cream  Commonly known as:  EMLA  Apply topically as needed. Apply to phlebotomy site at least one hour prior to phlebotomy.     lisinopril 10 MG tablet  Commonly known as:  PRINIVIL,ZESTRIL  TAKE 1 TABLET BY MOUTH TWICE DAILY     magnesium oxide 400 MG tablet  Commonly known as:  MAG-OX  Take 1,200 mg by mouth daily.     mineral oil liquid  Take 30 mLs by mouth at bedtime.  ondansetron 4 MG disintegrating tablet  Commonly known as:  ZOFRAN ODT  Take 1 tablet (4 mg total) by mouth every 8 (eight) hours as needed for nausea.     oxyCODONE 20 MG 12 hr tablet  Commonly known as:  OXYCONTIN  Take 1 tablet (20 mg total) by mouth every 8 (eight) hours. Brand name medically necessary.     ROXICODONE 15 MG immediate release tablet  Generic drug:  oxyCODONE  Take 1 tablet (15 mg total) by mouth every 8 (eight) hours as needed for pain. Brand name medically necessary.     oxyCODONE 10 MG 12 hr tablet  Commonly known as:  OXYCONTIN  Take 10 mg by mouth every 12 (twelve) hours as needed for pain. Brand name medically necessary.     potassium chloride 10 MEQ tablet  Commonly known as:  K-DUR  Take 4 tablets (40 mEq total) by mouth daily.     PREMARIN 0.625 MG tablet  Generic drug:  estrogens (conjugated)  Take 0.625 mg by mouth daily.     promethazine 25 MG suppository  Commonly known as:  PHENERGAN  Place 25 mg rectally every 6 (six) hours as needed for nausea.     SENNA LAX PO  Take 1 tablet by mouth 3 (three) times daily.     temazepam 30 MG capsule  Commonly known as:  RESTORIL  Take 30 mg by mouth at bedtime.     thyroid 60 MG tablet  Commonly known as:  ARMOUR  Take 150 mg by mouth daily before breakfast. Levothyroxine is not effective. Takes only armour thyroid.     UNABLE TO FIND  - Out-patient physical therapy  -   -  Diagnosis: CVA, deconditioning     Valerian 100 MG Caps  Take 1 capsule by mouth daily as needed (for sleep).       Allergies  Allergen Reactions  . Codeine Nausea Only  . Synthroid (Levothyroxine Sodium) Other (See Comments)    Not effective. Causes excessive sleepiness. Takes armour thyroid  . Calcium Channel Blockers Palpitations  . Penicillins Rash      The results of significant diagnostics from this hospitalization (including imaging, microbiology, ancillary and laboratory) are listed below for reference.    Significant Diagnostic Studies: Ct Head Wo Contrast  10/14/2012   *RADIOLOGY REPORT*  Clinical Data: Throbbing and cramping pain in right arm, shoulder, neck, head and right side of the face.  CT HEAD WITHOUT CONTRAST  Technique:  Contiguous axial images were obtained from the base of the skull through the vertex without contrast.  Comparison: Maxillofacial CT performed 05/09/2003, and MRI/MRA of the brain performed 10/05/2012  Findings:   Evolving acute infarct is noted within the left frontal and parietal lobes, more diffuse than seen on prior MRI, though there is still relative sparing of the overlying gray matter. There is no evidence for hemorrhagic transformation at this time. No significant mass effect or midline shift is seen.  Scattered periventricular and subcortical white matter change likely reflects small vessel ischemic microangiopathy.  The posterior fossa, including the cerebellum, brainstem and fourth ventricle, is within normal limits.  The third and lateral ventricles, and basal ganglia are unremarkable in appearance.  There is no evidence of fracture; visualized osseous structures are unremarkable in appearance.  The orbits are within normal limits. The paranasal sinuses and mastoid air cells are well-aerated.  No significant soft tissue abnormalities are seen.  IMPRESSION:  1.  Evolving acute infarct within the left frontal  and parietal lobes, more diffuse than on  the prior MRI, though there is still relative sparing of the overlying gray matter.  No evidence for hemorrhagic transformation at this time.  No mass effect or midline shift seen. 2.  Scattered small vessel ischemic microangiopathy noted.  These results were called by telephone on 10/14/2012 at 10:17 p.m. to Dr. Audelia Hives, who verbally acknowledged these results.   Original Report Authenticated By: Tonia Ghent, M.D.   Mr [facility] Wo Contrast  10/05/2012   *RADIOLOGY REPORT*  Clinical Data:  Right arm weakness and abnormal speech.  MRI HEAD WITHOUT CONTRAST MRA HEAD WITHOUT CONTRAST  Technique:  Multiplanar, multiecho pulse sequences of the brain and surrounding structures were obtained without intravenous contrast. Angiographic images of the head were obtained using MRA technique without contrast.  Comparison:  MRI brain at [facility] 12/15/2011.  MRI HEAD  Findings:  Scattered areas of restricted diffusion are evident within a watershed distribution between the left ACA and MCA territories.  T2 hyperintensities are associated with the areas of acute infarction.  Mild generalized atrophy is otherwise stable.  No hemorrhage or mass lesion is evident.  The ventricles are of normal size.  A remote lacunar infarct of the right caudate head is stable.  Marrow signal in the upper cervical spine and calvarium is decreased relative to the prior study.  No discrete lesions are evident. Abnormal signal is present within the left internal carotid arteries suggesting occlusion or high-grade stenosis.  The flow is present in the right internal carotid artery and the posterior circulation.  The globes and orbits are intact.  Minimal mucosal thickening is present throughout the paranasal sinuses.  No fluid levels are present.  The mastoid air cells are clear.  IMPRESSION:  1. Scattered acute / subacute non hemorrhagic infarcts within a watershed territory between the left ACA and MCA distributions. 2.   Occluded or extremely slow flow within the left internal carotid artery at the skull base. 3.  Stable atrophy and minimal white matter disease otherwise. 4.  Decreased T1 marrow signal within the upper cervical spine and calvarium.  This is compatible with the patient's known anemia.  MRA HEAD  Findings: Markedly diminished signal is evident within the left internal carotid artery, particularly within the cavernous segment. There is diminished signal in the left A1 segment compared the right.  The left M1 segment is less robust than on the right with more pronounced segmental attenuation of distal left MCA branch vessels.  The right vertebral artery is slightly dominant to the left.  The PICA origins are visualized and normal.  The basilar artery is within normal limits.  Both posterior cerebral arteries originate from the basilar tip.  MRA signal is evident into much more distal left PCA branch vessels.  There is attenuation of right-sided PCA branch vessels.  IMPRESSION:  1.  Markedly diminished signal within the left internal carotid artery and branch vessels.  This corresponds to the area of infarction and raises concern for mil proximal stenosis. 2.  There may be a high-grade stenosis within the cavernous segment. 3.  Prominent left PCA branch vessels.  This is likely related to attempts at collateral flow.  These results were called by telephone on 10/05/2012 at 08:05 p.m. to Tana Conch, R.N., [facility] 6NT, who verbally acknowledged these results.   Original Report Authenticated By: Marin Roberts, M.D.   Mr Brain Wo Contrast  10/16/2012   *RADIOLOGY REPORT*  Clinical Data: Recent stroke.  Recurrent seizures.  MRI HEAD WITHOUT CONTRAST  Technique:  Multiplanar, multiecho pulse sequences of the brain and surrounding structures were obtained according to standard protocol without intravenous contrast.  Comparison: Head CT 10/14/2012.  MRI 10/05/2012.  Findings: Diffusion imaging shows some residual signal  within the areas of acute infarction previously seen in the left hemisphere primarily in the frontal and to a lesser extent in the parietal region affecting the cortical brain and underlying white matter. There is no evidence of new acute infarction.  However, the patient has developed new regions of cortical and subcortical edema in the left frontal and parietal region since 2 days ago.  In the setting of seizure, this may be postictal change.  If this were acute infarction, it would be expected to show restricted diffusion, which it largely does not.  The differential diagnosis includes cerebritis.  There are some minimal petechial blood products in any left frontal region.  No frank hematoma.  No mass effect or shift. No hydrocephalus or extra-axial fluid collection.  IMPRESSION: Development of new edema signal within the left frontal and parietal cortical and subcortical brain, without accompanying restricted diffusion.  Therefore, this probably represents postictal change given the history of recurrent seizures.  There are expected evolutionary changes occurring within the areas of watershed infarction of the left hemisphere.  No new restricted diffusion/acute infarction.   Original Report Authenticated By: Paulina Fusi, M.D.   Mr Brain Wo Contrast  10/05/2012   *RADIOLOGY REPORT*  Clinical Data:  Right arm weakness and abnormal speech.  MRI HEAD WITHOUT CONTRAST MRA HEAD WITHOUT CONTRAST  Technique:  Multiplanar, multiecho pulse sequences of the brain and surrounding structures were obtained without intravenous contrast. Angiographic images of the head were obtained using MRA technique without contrast.  Comparison:  MRI brain at [facility] 12/15/2011.  MRI HEAD  Findings:  Scattered areas of restricted diffusion are evident within a watershed distribution between the left ACA and MCA territories.  T2 hyperintensities are associated with the areas of acute infarction.  Mild generalized atrophy is  otherwise stable.  No hemorrhage or mass lesion is evident.  The ventricles are of normal size.  A remote lacunar infarct of the right caudate head is stable.  Marrow signal in the upper cervical spine and calvarium is decreased relative to the prior study.  No discrete lesions are evident. Abnormal signal is present within the left internal carotid arteries suggesting occlusion or high-grade stenosis.  The flow is present in the right internal carotid artery and the posterior circulation.  The globes and orbits are intact.  Minimal mucosal thickening is present throughout the paranasal sinuses.  No fluid levels are present.  The mastoid air cells are clear.  IMPRESSION:  1. Scattered acute / subacute non hemorrhagic infarcts within a watershed territory between the left ACA and MCA distributions. 2.  Occluded or extremely slow flow within the left internal carotid artery at the skull base. 3.  Stable atrophy and minimal white matter disease otherwise. 4.  Decreased T1 marrow signal within the upper cervical spine and calvarium.  This is compatible with the patient's known anemia.  MRA HEAD  Findings: Markedly diminished signal is evident within the left internal carotid artery, particularly within the cavernous segment. There is diminished signal in the left A1 segment compared the right.  The left M1 segment is less robust than on the right with more pronounced segmental attenuation of distal left MCA branch vessels.  The right vertebral artery is slightly dominant to the left.  The PICA origins are visualized and normal.  The basilar artery is within normal limits.  Both posterior cerebral arteries originate from the basilar tip.  MRA signal is evident into much more distal left PCA branch vessels.  There is attenuation of right-sided PCA branch vessels.  IMPRESSION:  1.  Markedly diminished signal within the left internal carotid artery and branch vessels.  This corresponds to the area of infarction and raises  concern for mil proximal stenosis. 2.  There may be a high-grade stenosis within the cavernous segment. 3.  Prominent left PCA branch vessels.  This is likely related to attempts at collateral flow.  These results were called by telephone on 10/05/2012 at 08:05 p.m. to Tana Conch, R.N., [facility] 6NT, who verbally acknowledged these results.   Original Report Authenticated By: Marin Roberts, M.D.   Nm Hepatobiliary  10/01/2012   *RADIOLOGY REPORT*  Clinical Data: Gallstones, possible acute cholecystitis  NUCLEAR MEDICINE HEPATOHBILIARY INCLUDE GB  Radiopharmaceutical:  5.5 mCi 58m technetium Choletec  Comparison: CT abdomen/pelvis 09/30/2012  Findings: Normal hepatic parenchymal uptake of the radiotracer with excretion into the intrahepatic and common bile duct.  Radiotracer enters the small bowel progresses distally.  At 60 minutes, there was no radiotracer within the gallbladder lumen.  Therefore, 3 mg of morphine was administered intravenously.  Continued nonvisualization of the gallbladder at 30 minutes post morphine administration.  IMPRESSION:  Nonvisualization of the gallbladder at 60 minutes and also 30 minutes post morphine challenge.  Findings are consistent with obstruction of the cystic duct as can be seen in acute cholecystitis.  These results were called by telephone on 10/01/2012 at 3:20 p.m. to Dr. Lindie Spruce, who verbally acknowledged these results.   Original Report Authenticated By: Malachy Moan, M.D.   US Abdomen Complete  09/30/2012   *RADIOLOGY REPORT*  Clinical Data:  Mid epigastric pain.   ABDOMINAL ULTRASOUND COMPLETE  Comparison:  None.  Findings:  Gallbladder:  Multiple stones are identified within the gallbladder measuring up to 8 mm.  No gallbladder wall thickening or pericholecystic fluid.  Negative sonographic Murphy's sign.  Common Bile Duct:  The common bile duct is increased in caliber measuring up to 8.2 mm.  Liver: No focal mass lesion identified.  Within normal limits in  parenchymal echogenicity.  IVC:  Appears normal.  Pancreas:  No abnormality identified.  Spleen:  Within normal limits in size and echotexture.  Right kidney:  Normal in size and parenchymal echogenicity.  No evidence of mass or hydronephrosis.  Left kidney:  Normal in size and parenchymal echogenicity.  No evidence of mass or hydronephrosis.  Abdominal Aorta:  No aneurysm identified.  IMPRESSION:  1.  Gallstones. 2.  Increased caliber of the common bile duct which measures up to 8.2 mm. If there is clinical concern for choledocholithiasis an MRCP may be helpful.   Original Report Authenticated By: Signa Kell, M.D.   Ir Perc Cholecystostomy  10/02/2012   *RADIOLOGY REPORT*  Clinical Data: Acute calculus cholecystitis, nonoperative candidate  ULTRASOUND FLUOROSCOPIC PERCUTANEOUS TRANSHEPATIC CHOLECYSTOSTOMY  Date:  10/02/2012 12:00:00  Radiologist:  M. Ruel Favors, M.D.  Medications:  2 mg Versed, 150 mcg Fentanyl, 50 mg Demerol, the patient is already receiving antibiotics.  Guidance:  Ultrasound fluoroscopic  Fluoroscopy time:  42 seconds  Sedation time:  40 minutes  Contrast volume:  None.  Complications:  No immediate  PROCEDURE/FINDINGS:  Informed consent was obtained from the patient following explanation of the procedure, risks, benefits and alternatives. The patient understands, agrees and consents  for the procedure. All questions were addressed.  A time out was performed.  Maximal barrier sterile technique utilized including caps, mask, sterile gowns, sterile gloves, large sterile drape, hand hygiene, and betadine  Previous imaging reviewed.  Preliminary ultrasound performed in the right upper quadrant.  The distended gallbladder containing gallstones was localized.  Under sterile conditions and local anesthesia, a 21 gauge 15 cm access needle was advanced under direct ultrasound through a transhepatic window into the gallbladder.  Needle position confirmed with ultrasound.  There was return of bile.   Guide wire advanced.  Transitional dilator inserted.  Guide wire exchanged for an Amplatz guide wire.  Tract dilatation performed to insert a 10-French drain catheter. Retention loop formed in the gallbladder.  Syringe aspiration yielded 40 ml exudative bile.  Sample sent for Gram stain and culture.  Catheter secured with a Prolene suture and connected to external gravity drainage.  Sterile dressing applied.  No immediate complication.  IMPRESSION: Successful ultrasound and fluoroscopic 10-French cholecystostomy   Original Report Authenticated By: Judie Petit. Miles Costain, M.D.   Dg Chest Port 1 View  10/04/2012   *RADIOLOGY REPORT*  Clinical Data: Increased shortness of breath, evaluate for possible fluid overload  PORTABLE CHEST - 1 VIEW  Comparison: Portable chest x-ray of 10/02/2012  Findings: The lungs appear slightly better aerated.  Mild cardiomegaly is stable.  No definite pulmonary vascular congestion is currently seen.  There are degenerative changes throughout the thoracic spine.  IMPRESSION: Slightly better aeration.  Stable cardiomegaly.  No definite pulmonary vascular congestion.   Original Report Authenticated By: Dwyane Dee, M.D.   Dg Chest Portable 1 View  10/02/2012   *RADIOLOGY REPORT*  Clinical Data: Dyspnea.  PORTABLE CHEST - 1 VIEW  Comparison: One-view chest 09/30/2012.  Findings: The heart to size is exaggerated by low lung volumes. Mild pulmonary vascular congestion is present.  Minimal bibasilar atelectasis is noted. The visualized soft tissues and bony thorax are unremarkable.  IMPRESSION:  1.  Borderline cardiomegaly and mild pulmonary vascular congestion, suggesting early congestive heart failure. 2.  Minimal bibasilar atelectasis is associated with low lung volumes.   Original Report Authenticated By: Marin Roberts, M.D.   Dg Chest Port 1 View  09/30/2012   *RADIOLOGY REPORT*  Clinical Data: Mid sternal chest pain since 06:00 p.m.  PORTABLE CHEST - 1 VIEW  Comparison: 04/01/2008  Findings:  The heart size and pulmonary vascularity are normal. The lungs appear clear and expanded without focal air space disease or consolidation. No blunting of the costophrenic angles.  No pneumothorax.  Mediastinal contours appear intact.  No significant change since previous study.  IMPRESSION: No evidence of active pulmonary disease.   Original Report Authenticated By: Burman Nieves, M.D.   Ct Angio Chest Aorta W/cm &/or Wo/cm  09/30/2012   *RADIOLOGY REPORT*  Clinical Data:  Chest pain radiating to the back.  Epigastric pain. Nausea and vomiting.  CT ANGIOGRAPHY CHEST, ABDOMEN AND PELVIS  Technique:  Multidetector CT imaging through the chest, abdomen and pelvis was performed using the standard protocol during bolus administration of intravenous contrast.  Multiplanar reconstructed images including MIPs were obtained and reviewed to evaluate the vascular anatomy.  Contrast: OMNIPAQUE IOHEXOL 350 MG/ML SOLN  Comparison:   None.  CTA CHEST  Findings:  Noncontrast CT images of the chest demonstrate no evidence of intramural thrombus in the thoracic aorta.  Suggestion of minimal calcification in the coronary arteries.  Contrast enhanced images demonstrate normal caliber thoracic aorta. No evidence of aneurysm.  Thoracic  aorta is patent without evidence of dissection or significant thrombus.  Normal appearance of the great vessels.  Visualized pulmonary arteries appear patent without evidence of central pulmonary embolus.  Normal heart size.  No significant lymphadenopathy in the chest. The esophagus is decompressed.  Thyroid gland appears atrophic.  No pleural effusions.  Visualization of the lungs is limited due to respiratory motion artifact but there appears to be a patchy mosaic pattern suggesting edema or emphysema.  No focal consolidation.  No pneumothorax.  Airways appear patent.  Degenerative changes in the thoracic spine with normal alignment.  No destructive bone lesions are appreciated.   Review of  the MIP images confirms the above findings.  IMPRESSION: No evidence of thoracic aortic dissection.  CTA ABDOMEN AND PELVIS  Findings:  Normal caliber abdominal aorta.  No aneurysm.  The abdominal aorta, celiac axis, superior mesenteric artery, bilateral single renal arteries, the inferior mesenteric artery, and bilateral iliac, common iliac, and femoral arteries appear patent. No evidence of dissection or significant thrombus.  The gallbladder is distended with suggestion of sludge or possibly layering small stones.  No wall thickening or inflammatory infiltration is suggested.  The liver, spleen, pancreas, adrenal glands, inferior vena cava, and retroperitoneal lymph nodes are unremarkable.  Sub centimeter parenchymal cysts in both kidneys. No evidence of hydronephrosis or solid mass.  The esophagus, small bowel, and colon are mostly decompressed.  No abnormal bowel distension.  No free air or free fluid in the abdomen.  Pelvis:  The uterus appears atrophic or surgically absent.  No abnormal adnexal masses.  Rectosigmoid colon is stool-filled without evidence of diverticulitis.  The appendix is normal. No free or loculated pelvic fluid collections.  No significant pelvic lymphadenopathy.  Mild degenerative changes in the lumbar spine.  Normal alignment. No destructive bone lesions appreciated.   Review of the MIP images confirms the above findings.  IMPRESSION: No evidence of aneurysm or dissection of the abdominal aorta.  The gallbladder is distended and contains sludge or small stones.   Original Report Authenticated By: Burman Nieves, M.D.   Ct Angio Abd/pel W/ And/or W/o  09/30/2012   *RADIOLOGY REPORT*  Clinical Data:  Chest pain radiating to the back.  Epigastric pain. Nausea and vomiting.  CT ANGIOGRAPHY CHEST, ABDOMEN AND PELVIS  Technique:  Multidetector CT imaging through the chest, abdomen and pelvis was performed using the standard protocol during bolus administration of intravenous contrast.   Multiplanar reconstructed images including MIPs were obtained and reviewed to evaluate the vascular anatomy.  Contrast: OMNIPAQUE IOHEXOL 350 MG/ML SOLN  Comparison:   None.  CTA CHEST  Findings:  Noncontrast CT images of the chest demonstrate no evidence of intramural thrombus in the thoracic aorta.  Suggestion of minimal calcification in the coronary arteries.  Contrast enhanced images demonstrate normal caliber thoracic aorta. No evidence of aneurysm.  Thoracic aorta is patent without evidence of dissection or significant thrombus.  Normal appearance of the great vessels.  Visualized pulmonary arteries appear patent without evidence of central pulmonary embolus.  Normal heart size.  No significant lymphadenopathy in the chest. The esophagus is decompressed.  Thyroid gland appears atrophic.  No pleural effusions.  Visualization of the lungs is limited due to respiratory motion artifact but there appears to be a patchy mosaic pattern suggesting edema or emphysema.  No focal consolidation.  No pneumothorax.  Airways appear patent.  Degenerative changes in the thoracic spine with normal alignment.  No destructive bone lesions are appreciated.   Review of  the MIP images confirms the above findings.  IMPRESSION: No evidence of thoracic aortic dissection.  CTA ABDOMEN AND PELVIS  Findings:  Normal caliber abdominal aorta.  No aneurysm.  The abdominal aorta, celiac axis, superior mesenteric artery, bilateral single renal arteries, the inferior mesenteric artery, and bilateral iliac, common iliac, and femoral arteries appear patent. No evidence of dissection or significant thrombus.  The gallbladder is distended with suggestion of sludge or possibly layering small stones.  No wall thickening or inflammatory infiltration is suggested.  The liver, spleen, pancreas, adrenal glands, inferior vena cava, and retroperitoneal lymph nodes are unremarkable.  Sub centimeter parenchymal cysts in both kidneys. No evidence of  hydronephrosis or solid mass.  The esophagus, small bowel, and colon are mostly decompressed.  No abnormal bowel distension.  No free air or free fluid in the abdomen.  Pelvis:  The uterus appears atrophic or surgically absent.  No abnormal adnexal masses.  Rectosigmoid colon is stool-filled without evidence of diverticulitis.  The appendix is normal. No free or loculated pelvic fluid collections.  No significant pelvic lymphadenopathy.  Mild degenerative changes in the lumbar spine.  Normal alignment. No destructive bone lesions appreciated.   Review of the MIP images confirms the above findings.  IMPRESSION: No evidence of aneurysm or dissection of the abdominal aorta.  The gallbladder is distended and contains sludge or small stones.   Original Report Authenticated By: Burman Nieves, M.D.    Microbiology: Recent Results (from the past 240 hour(s))  MRSA PCR SCREENING     Status: None   Collection Time    10/15/12  2:11 AM      Result Value Range Status   MRSA by PCR NEGATIVE  NEGATIVE Final   Comment:            The GeneXpert MRSA Assay (FDA     approved for NASAL specimens     only), is one component of a     comprehensive MRSA colonization     surveillance program. It is not     intended to diagnose MRSA     infection nor to guide or     monitor treatment for     MRSA infections.     Labs: Basic Metabolic Panel:  Recent Labs Lab 10/14/12 2103 10/14/12 2201 10/16/12 0400 10/17/12 0459 10/18/12 0410  NA 134* 138 137 138 139  K 4.4 4.6 3.6 3.5 3.5  CL 99 101 102 105 105  CO2 27  --  27 28 28   GLUCOSE 97 98 99 95 90  BUN 9 8 6 6 7   CREATININE 0.71 0.80 0.60 0.57 0.63  CALCIUM 9.0  --  8.8 8.7 8.7   Liver Function Tests:  Recent Labs Lab 10/14/12 2103  AST 20  ALT 20  ALKPHOS 101  BILITOT 0.7  PROT 7.0  ALBUMIN 3.2*   No results found for this basename: LIPASE, AMYLASE,  in the last 168 hours No results found for this basename: AMMONIA,  in the last 168  hours CBC:  Recent Labs Lab 10/14/12 2103 10/14/12 2201 10/15/12 0825 10/16/12 0400 10/17/12 1151 10/18/12 0410  WBC 6.4  --  6.0 5.5 3.9* 5.4  NEUTROABS 4.9  --   --   --   --   --   HGB 7.7* 9.5* 6.7* 6.4* 5.9* 5.7*  HCT 29.0* 28.0* 25.3* 24.2* 22.7* 21.9*  MCV 66.5*  --  66.4* 65.6* 66.6* 66.6*  PLT 390  --  319 378 354 356  Cardiac Enzymes: No results found for this basename: CKTOTAL, CKMB, CKMBINDEX, TROPONINI,  in the last 168 hours BNP: BNP (last 3 results)  Recent Labs  10/05/12 0455  PROBNP 2326.0*   CBG:  Recent Labs Lab 10/15/12 1932 10/15/12 2349 10/16/12 0358 10/16/12 0802 10/16/12 1156  GLUCAP 130* 93 87 102* 86       Signed:  Calianne Larue  [facility] 10/18/2012, 7:26 AM

## 2012-10-18 NOTE — Progress Notes (Signed)
Patient doing well.  No new c/o-- wants to go home.  Understands no driving or operating heavy machinery.  Medications sent to pharmacy bu Dr. Jomarie Longs- see if home health can help with PICC- Dr. Rexene Edison wants left in.  Shanda Bumps Eulalie Speights DO

## 2012-10-18 NOTE — Telephone Encounter (Signed)
Will send a letter with appt date and time and if it does not work for the patient, we will ask if they can call and reschedule.

## 2012-10-18 NOTE — Progress Notes (Signed)
[name] is a little lethargic this morning. There might be some word finding issues. She gets a little frustrated.  She seemed to have a little bit of strength in the right arm. Still very little strength in the right hand. She is taking physical therapy.  She's had no obvious seizure activity. Continues on Keppra. One question is how long she will need to be on Keppra??  She's had no pain issues. Her appetite is marginal. There is no issues with bowels or bladder.  Her vital signs all look good. Blood pressure 127/55. Pulse is 80.  Lungs are clear bilaterally. Cardiac exam regular rate and rhythm with normal S1-S2. She is a 1/6 systolic ejection murmur. Abdomen is soft. Bowel sounds are active. Extremities shows good strength in her extremities outside the right upper. She has 3/5 strength in the right upper extremity. There is still the strength in the right hand. Has good pulses.  It seems like she might be going home today. She'll be getting physical therapy at home.  Her hemoglobin is 5.7. This is fine.  The PICC line will stay in. Need to make sure that there is adequate education for her husband to flush the PICC line. A nurse may need to monitor their house to assist with flushes initially.  Hewitt Shorts  Psalm 44:4

## 2012-10-18 NOTE — Clinical Documentation Improvement (Signed)
THIS DOCUMENT IS NOT A PERMANENT PART OF THE MEDICAL RECORD  Please update your documentation with the medical record to reflect your response to this query. If you need help knowing how to do this please call [phone].  10/18/12   Dear Dr. Jomarie Longs,  In a better effort to capture your patient's severity of illness, reflect appropriate length of stay and utilization of resources, a review of the patient medical record has revealed the following indicators.   Based on your clinical judgment, please clarify and document in a progress note and/or discharge summary the clinical condition associated with the following supporting information: In responding to this query please exercise your independent judgment.  The fact that a query is asked, does not imply that any particular answer is desired or expected.   "Neuro exam w/ worsening of pts prior deficit - RUE weakness..." is documented in the progress notes/H&P. If possible, please help, by clarifying the patient's condition/diagnosis in the progress note(s) and in the discharge summary. Thank you.  Possible Clinical Conditions?               please state if it was POA (present on admission)  - Right Hemiparesis  - Right Hemaplegia  - Other condition (please document in the progress notes and/or discharge summary)  - Cannot Clinically determine at this time   Supporting Information:  Neurological Exam 7/20: Motor: Right : Upper extremity 0/5 Left: Upper extremity 5/5 Lower extremity 5-/5 Lower extremity 5/5  **You may use possible, probable, or suspect with inpatient documentation. possible, probable, suspected diagnoses MUST be documented at the time of discharge   No additional documentation in chart upon review. SM  Thank You,  Saul Fordyce  Clinical Documentation Specialist: [phone] Health Information Management [place]

## 2012-10-18 NOTE — Progress Notes (Signed)
I agree with the following treatment note after reviewing documentation.   Johnston, Montrelle Eddings Brynn   OTR/L Pager: [phone] Office: [phone] .   

## 2012-10-18 NOTE — Progress Notes (Signed)
Occupational Therapy Treatment Patient Details Name: SINDIA KOWALCZYK MRN: [id number] DOB: 1945-04-02 Today's Date: 10/18/2012 Time: 4098-1191 OT Time Calculation (min): 29 min  OT Assessment / Plan / Recommendation  History of present illness DORETTE HARTEL is a 67 y.o. female with Past medical history of polycythemia vera, hypertension, fibromyalgia, hypothyroidism who was recently admitted for acute cholecystitis and had an acute watershed stroke as well, now coming in with 9 episode of recurrent seizures.Marland Kitchen RUE weakness with distal greater than proximal. Due to polychethemia vera Hbg goal is 6   Clinical Impression Pt complaining of hurting all over (concentrated in stomach) and feeling nauseous. Pt given handout with HEP for RUE. Pt and husband were educated on HEP, and practiced with OTS. Pt will continue to benefit from skilled OT while in the acute setting. When nausea is under control, Pt will d/c home with OPOT.      Follow Up Recommendations  Outpatient OT       Equipment Recommendations  None recommended by OT       Frequency Min 3X/week   Progress towards OT Goals Progress towards OT goals: Not progressing toward goals - comment (Pt was too nauseous to get out of bed. HEP implemented.)  Plan Discharge plan remains appropriate    Precautions / Restrictions Precautions Precautions: Fall Restrictions Weight Bearing Restrictions: No   Pertinent Vitals/Pain Pt reported no pain, but very nauseous.    ADL  ADL Comments: Session focused on HEP for RUE. Pt feeling extremely nauseous. Pt and husband educated on HEP, and practiced with OTS. Pt given handout.    OT Goals(current goals can now be found in the care plan section) Acute Rehab OT Goals Patient Stated Goal: return home and crochet OT Goal Formulation: With patient Time For Goal Achievement: 10/21/12 Potential to Achieve Goals: Good ADL Goals Pt Will Perform Grooming: with set-up;standing Pt Will Perform Upper Body  Dressing: with supervision;with caregiver independent in assisting;sitting Pt Will Perform Lower Body Dressing: with set-up;with caregiver independent in assisting;sit to/from stand Pt Will Transfer to Toilet: with supervision;ambulating;bedside commode Pt Will Perform Toileting - Clothing Manipulation and hygiene: with supervision;sit to/from stand Pt Will Perform Tub/Shower Transfer: Shower transfer;with supervision;ambulating;shower seat Additional ADL Goal #1: Pt will perform bed mobility at supervision level as precursor for EOB ADLs.  Visit Information  Last OT Received On: 10/18/12 Assistance Needed: +1 History of Present Illness: ALYNA STENSLAND is a 67 y.o. female with Past medical history of polycythemia vera, hypertension, fibromyalgia, hypothyroidism who was recently admitted for acute cholecystitis and had an acute watershed stroke as well, now coming in with 9 episode of recurrent seizures.Marland Kitchen RUE weakness with distal greater than proximal. Due to polychethemia vera Hbg goal is 6          Cognition  Cognition Arousal/Alertness: Lethargic Behavior During Therapy: Flat affect Overall Cognitive Status: Difficult to assess Area of Impairment: Attention Current Attention Level: Sustained Difficult to assess due to: Level of arousal    Mobility  Bed Mobility Bed Mobility: Not assessed Transfers Transfers: Not assessed          End of Session OT - End of Session Activity Tolerance: Patient limited by fatigue;Other (comment) (Pt limited by nausea) Patient left: in bed;with call bell/phone within reach;with family/visitor present Nurse Communication: Mobility status  GO     Sherryl Manges 10/18/2012, 2:33 PM

## 2012-10-19 LAB — CBC
HCT: 23.2 % — ABNORMAL LOW (ref 36.0–46.0)
Hemoglobin: 6.1 g/dL — CL (ref 12.0–15.0)
MCH: 17.3 pg — ABNORMAL LOW (ref 26.0–34.0)
MCHC: 26.3 g/dL — ABNORMAL LOW (ref 30.0–36.0)

## 2012-10-19 LAB — BASIC METABOLIC PANEL
BUN: 6 mg/dL (ref 6–23)
CO2: 30 mEq/L (ref 19–32)
Chloride: 103 mEq/L (ref 96–112)
Glucose, Bld: 92 mg/dL (ref 70–99)
Potassium: 3.4 mEq/L — ABNORMAL LOW (ref 3.5–5.1)

## 2012-10-19 MED ORDER — POTASSIUM CHLORIDE CRYS ER 20 MEQ PO TBCR
40.0000 meq | EXTENDED_RELEASE_TABLET | Freq: Once | ORAL | Status: AC
  Administered 2012-10-19: 40 meq via ORAL
  Filled 2012-10-19 (×2): qty 2

## 2012-10-19 MED ORDER — HEPARIN SOD (PORK) LOCK FLUSH 100 UNIT/ML IV SOLN
250.0000 [IU] | INTRAVENOUS | Status: AC | PRN
  Administered 2012-10-19: 500 [IU]

## 2012-10-19 NOTE — Care Management Note (Signed)
    Page 1 of 2   10/19/2012     2:23:09 PM   CARE MANAGEMENT NOTE 10/19/2012  Patient:  [name], [name]   Account Number:  [id number]  Date Initiated:  10/18/2012  Documentation initiated by:  Elmer Bales  Subjective/Objective Assessment:   Pt admitted for CVA     Action/Plan:   Will follow for discharge needs.   Anticipated DC Date:  10/19/2012   Anticipated DC Plan:  HOME/SELF CARE      DC Planning Services  CM consult      Choice offered to / List presented to:  C-1 Patient        HH arranged  HH-1 RN  HH-2 PT  HH-3 OT      [facility] agency  [facility]   Status of service:  Completed, signed off Medicare Important Message given?   (If response is "NO", the following Medicare IM given date fields will be blank) Date Medicare IM given:   Date Additional Medicare IM given:    Discharge Disposition:  HOME W HOME HEALTH SERVICES  Per UR Regulation:  Reviewed for med. necessity/level of care/duration of stay  If discussed at Long Length of Stay Meetings, dates discussed:    Comments:  10/19/12 1115 Elmer Bales RN, MSN, CM- Spoke with Corrie Dandy with [facility] regarding patient's needs based on overall diagnoses.  Based on co-morbidities, patient will qualify for a HH RN/PT/OT, which will include PICC line maintenance through the duration of need for skilled disease management.  When skilled requirements are no longer met, patient's husband will be responsible for PICC flushes and dressing changes will need to be done at the [facility].  Pt and husband are both in agreement with this plan and intend to progress towards outpatient neuro rehab once patient has improved and no longer has a skilled need for home health.  Dr Benjamine Mola is aware of this plan and is in agreement.  [facility] has accepted the referral.   10/18/12 1630 Elmer Bales RN, MSN, CM-  Noted consult for [facility] for PICC line maintenance.  Pt and husband would like to use [facility].  Spoke with Hilda Lias with [facility], who stated that PICC maintenance is not considered a skillable need and will need to be taught to patient and husband prior to discharge.  Dr Benjamine Mola notified of the situation.  Pt and husband are interested in Outpatient Neuro Rehab at discharge.  CM will fax patient out if Dr Benjamine Mola is in agreement with this.  Pt is currently feeling unwell and will not be discharged tonight.  Per patient's husband, the oncologist had stated that the PICC line was not to be flushed by family, only by a HHRN.  CM will share this conversation with Dr Benjamine Mola in the morning, so plan of care can be determined.  Patient will need scripts for supplies at discharge if family is to provide PICC maintenance.

## 2012-10-19 NOTE — Progress Notes (Signed)
Patient seen and examined- no change in physical exam- vitals stable.  Had nausea with keppra yesterday but none today- plan to d.c home today [name]

## 2012-10-19 NOTE — Progress Notes (Signed)
Physical Therapy Treatment Patient Details Name: TESSI EUSTACHE MRN: [id number] DOB: 1945/07/16 Today's Date: 10/19/2012 Time: 4098-1191 PT Time Calculation (min): 12 min  PT Assessment / Plan / Recommendation  History of Present Illness MARGERY SZOSTAK is a 67 y.o. female with Past medical history of polycythemia vera, hypertension, fibromyalgia, hypothyroidism who was recently admitted for acute cholecystitis and had an acute watershed stroke as well, now coming in with 9 episode of recurrent seizures.Marland Kitchen RUE weakness with distal greater than proximal. Due to polychethemia vera Hbg goal is 6      PT Comments   Plan is for d/c home today with support from her husband. Patient reports she is feeling much better today and is ready to go home. Focus of our session today was on education pertaining to home safety. Patient declined any OOB/mobility today to save energy for transfer home. Patient and husband educated on importance of mobility as well as supervision for ambulation at home. Discussed sitting and resting as well as pumping her ankles in between position changes to allow for adequate venous return prior to getting up and walking (pt c/o light headedness getting up and walking initially). Educated patient on seated exercises that she can perform at home (ankle pumps and LAQ). Patient also instructed in importance of balance activity with energy conservation and rest so that she is able to perform the activities that she wants at home. Husband will be with her 24 hours at home. Case management to plan OPPT visit.    Follow Up Recommendations  Outpatient PT; 24 hour assist     Does the patient have the potential to tolerate intense rehabilitation     Barriers to Discharge  NA      Equipment Recommendations    none recommended   Recommendations for Other Services    Frequency     Progress towards PT Goals Progress towards PT goals: Not progressing toward goals - comment (pt to d/c home  today, focus was on home ed), all further goals can be determined at [facility]  Plan Current plan remains appropriate    Precautions / Restrictions Precautions Precautions: Fall Restrictions Weight Bearing Restrictions: No   Pertinent Vitals/Pain Denies pain, reports improved energy level this morning    Mobility  Bed Mobility Bed Mobility: Not assessed    Exercises Other Exercises Other Exercises: Educated and demonstrated HEP of ankle pumps and LAQ     PT Goals (current goals can now be found in the care plan section)    Visit Information  Last PT Received On: 10/19/12 History of Present Illness: SHARNITA BOGUCKI is a 67 y.o. female with Past medical history of polycythemia vera, hypertension, fibromyalgia, hypothyroidism who was recently admitted for acute cholecystitis and had an acute watershed stroke as well, now coming in with 9 episode of recurrent seizures.Marland Kitchen RUE weakness with distal greater than proximal. Due to polychethemia vera Hbg goal is 6    Subjective Data   I feel ready to go home today.    Cognition  Cognition Arousal/Alertness: Awake/alert Behavior During Therapy: WFL for tasks assessed/performed Overall Cognitive Status: Within Functional Limits for tasks assessed    Balance     End of Session PT - End of Session Patient left: in bed;with family/visitor present   GP     [facility] HELEN 10/19/2012, 10:48 AM

## 2012-10-21 ENCOUNTER — Encounter (HOSPITAL_COMMUNITY): Payer: Self-pay | Admitting: *Deleted

## 2012-10-21 ENCOUNTER — Emergency Department (HOSPITAL_COMMUNITY)
Admission: EM | Admit: 2012-10-21 | Discharge: 2012-10-21 | Disposition: A | Discharge status: Home / Self Care | Payer: Medicare Other | Attending: Emergency Medicine | Admitting: Emergency Medicine

## 2012-10-21 DIAGNOSIS — I2510 Atherosclerotic heart disease of native coronary artery without angina pectoris: Secondary | ICD-10-CM | POA: Insufficient documentation

## 2012-10-21 DIAGNOSIS — Z4801 Encounter for change or removal of surgical wound dressing: Secondary | ICD-10-CM | POA: Insufficient documentation

## 2012-10-21 DIAGNOSIS — Z780 Asymptomatic menopausal state: Secondary | ICD-10-CM | POA: Insufficient documentation

## 2012-10-21 DIAGNOSIS — Z862 Personal history of diseases of the blood and blood-forming organs and certain disorders involving the immune mechanism: Secondary | ICD-10-CM | POA: Insufficient documentation

## 2012-10-21 DIAGNOSIS — Z5189 Encounter for other specified aftercare: Secondary | ICD-10-CM

## 2012-10-21 DIAGNOSIS — I10 Essential (primary) hypertension: Secondary | ICD-10-CM | POA: Insufficient documentation

## 2012-10-21 DIAGNOSIS — Z79899 Other long term (current) drug therapy: Secondary | ICD-10-CM | POA: Insufficient documentation

## 2012-10-21 DIAGNOSIS — Z8679 Personal history of other diseases of the circulatory system: Secondary | ICD-10-CM | POA: Insufficient documentation

## 2012-10-21 DIAGNOSIS — Z880 Allergy status to penicillin: Secondary | ICD-10-CM | POA: Insufficient documentation

## 2012-10-21 DIAGNOSIS — Z8639 Personal history of other endocrine, nutritional and metabolic disease: Secondary | ICD-10-CM | POA: Insufficient documentation

## 2012-10-21 DIAGNOSIS — IMO0001 Reserved for inherently not codable concepts without codable children: Secondary | ICD-10-CM | POA: Insufficient documentation

## 2012-10-21 DIAGNOSIS — E039 Hypothyroidism, unspecified: Secondary | ICD-10-CM | POA: Insufficient documentation

## 2012-10-21 NOTE — ED Notes (Signed)
Assessed patients biliary stat lock securement device, biliary tube remains intact with suture no redness noted

## 2012-10-21 NOTE — ED Notes (Signed)
Statlock securement device placed drain is intact

## 2012-10-21 NOTE — ED Notes (Addendum)
Pt concerned about the plastic dressing and tube that comes from her gallbadder. Pt states that she stood and she felt her tube move slightly and her dressing connection broke. Pt worried that her tube was dislodged (though stiches are intact and not pulled from skin) pt has slight tenderness when palpating tube and area tube in placed.

## 2012-10-21 NOTE — ED Provider Notes (Signed)
History  This chart was scribed for non-physician practitioner, Felipa Furnace, working with Gavin Pound. Oletta Lamas, MD by Ardeen Jourdain, ED Scribe. This patient was seen in room TR10C/TR10C and the patient's care was started at 1902.  CSN: [id number]     Arrival date & time 10/21/12  7829  First MD Initiated Contact with Patient 10/21/12 1902     Chief Complaint  Patient presents with  . Wound Check    tube from gallbadder    The history is provided by the patient. No language interpreter was used.    HPI Comments: [name] is a 67 y.o. female who presents to the Emergency Department complaining of needing a wound recheck. She states she is concerned about the dressing and tube that come from her gallbladder. She states she was stepping out of the shower when she stepped on her 12 Fr biliary drain and broke the clamp. She denies any other symptoms or complaints at this time.    Past Medical History  Diagnosis Date  . 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 Fibromyalgia   . Hypothyroidism   . Essential hypertension   . Postmenopausal state     on hormone replacement therapy  . Aortic valve sclerosis     by echocardiogram 12/04/2009  . PAC (premature atrial contraction)     history of  . Coronary artery disease   . Hyperlipidemia   . History of echocardiogram 12/04/2009    showed mild ventricle hypertrophy and normal EF at 55-60% -- mild aortic valve sclerosis  -- trace mitral regurgitation -- slightly pulmonary artery pressure at 47  --  [name], M.D.   . Fatigue    Past Surgical History  Procedure Laterality Date  . Endarterectomy Left 10/10/2012    Procedure: ENDARTERECTOMY CAROTID- LEFT NECK WITH GREATER SAPHENOUS VEIN PATCH LEFT LEG;  Surgeon: Sherren Kerns, MD;  Location: [facility] OR;  Service: Vascular;  Laterality: Left;   Family History  Problem Relation Age of Onset  . Heart attack Father   . Coronary artery disease Mother     had  aortic valve replacement   History  Substance Use Topics  . Smoking status: Never Smoker   . Smokeless tobacco: Not on file  . Alcohol Use: No   No OB history available.  Review of Systems  Skin:       Wound check  All other systems reviewed and are negative.    Allergies  Codeine; Synthroid; Calcium channel blockers; and Penicillins  Home Medications   Current Outpatient Rx  Name  Route  Sig  Dispense  Refill  . albuterol-ipratropium (COMBIVENT) 18-103 MCG/ACT inhaler   Inhalation   Inhale 2 puffs into the lungs every 6 (six) hours as needed for wheezing.         Marland Kitchen ALPRAZolam (XANAX) 0.5 MG tablet   Oral   Take 0.5 mg by mouth at bedtime.         Marland Kitchen aspirin 325 MG tablet   Oral   Take 325 mg by mouth every evening.          . bisoprolol (ZEBETA) 10 MG tablet   Oral   Take 10 mg by mouth daily.         . cholecalciferol (VITAMIN D) 1000 UNITS tablet   Oral   Take 3,000 Units by mouth daily.          . clopidogrel (PLAVIX) 75 MG tablet   Oral  Take 75 mg by mouth daily.         . cyclobenzaprine (FLEXERIL) 10 MG tablet   Oral   Take 10 mg by mouth 3 (three) times daily as needed for muscle spasms.          . diphenhydrAMINE (BENADRYL) 25 MG tablet   Oral   Take 50 mg by mouth every 8 (eight) hours as needed.          . docusate sodium (COLACE) 100 MG capsule   Oral   Take 100 mg by mouth 3 (three) times daily.         Marland Kitchen estrogens, conjugated, (PREMARIN) 0.625 MG tablet   Oral   Take 0.625 mg by mouth daily.          . famotidine (PEPCID) 20 MG tablet   Oral   Take 1 tablet (20 mg total) by mouth 2 (two) times daily.   60 tablet   11   . fexofenadine (ALLEGRA) 180 MG tablet   Oral   Take 180 mg by mouth daily.           . fluoxymesterone (ANDROXY) 10 MG tablet   Oral   Take 2.5 mg by mouth daily as needed (before sexual activity).          . furosemide (LASIX) 40 MG tablet   Oral   Take 80 mg by mouth daily.          . Garlic Oil 1000 MG CAPS   Oral   Take 1 capsule by mouth daily.          Marland Kitchen levETIRAcetam (KEPPRA) 500 MG tablet   Oral   Take 1 tablet (500 mg total) by mouth 2 (two) times daily.   60 tablet   0   . lidocaine-prilocaine (EMLA) cream   Topical   Apply topically as needed. Apply to phlebotomy site at least one hour prior to phlebotomy.   30 g   0   . lisinopril (PRINIVIL,ZESTRIL) 10 MG tablet      TAKE 1 TABLET BY MOUTH TWICE DAILY   60 tablet   0   . magnesium oxide (MAG-OX) 400 MG tablet   Oral   Take 1,200 mg by mouth daily.           . mineral oil liquid   Oral   Take 30 mLs by mouth at bedtime.         . ondansetron (ZOFRAN ODT) 4 MG disintegrating tablet   Oral   Take 1 tablet (4 mg total) by mouth every 8 (eight) hours as needed for nausea.   30 tablet   1   . oxyCODONE (OXYCONTIN) 10 MG 12 hr tablet   Oral   Take 10 mg by mouth every 12 (twelve) hours as needed for pain. Brand name medically necessary.         Marland Kitchen oxyCODONE (OXYCONTIN) 20 MG 12 hr tablet   Oral   Take 1 tablet (20 mg total) by mouth every 8 (eight) hours. Brand name medically necessary.   90 tablet   0     Dispense as written.   . potassium chloride (K-DUR) 10 MEQ tablet   Oral   Take 4 tablets (40 mEq total) by mouth daily.         . promethazine (PHENERGAN) 25 MG suppository   Rectal   Place 25 mg rectally every 6 (six) hours as needed for nausea.         Marland Kitchen  ROXICODONE 15 MG immediate release tablet   Oral   Take 1 tablet (15 mg total) by mouth every 8 (eight) hours as needed for pain. Brand name medically necessary.   90 tablet   0     Dispense as written.   . Sennosides (SENNA LAX PO)   Oral   Take 1 tablet by mouth 3 (three) times daily.          . temazepam (RESTORIL) 30 MG capsule   Oral   Take 30 mg by mouth at bedtime.          Marland Kitchen thyroid (ARMOUR) 60 MG tablet   Oral   Take 150 mg by mouth daily before breakfast. Levothyroxine is not  effective. Takes only armour thyroid.         Marland Kitchen UNABLE TO FIND      Out-patient physical therapy  Diagnosis: CVA, deconditioning   1 Mutually Defined   0   . Valerian 100 MG CAPS   Oral   Take 1 capsule by mouth daily as needed (for sleep).           Triage Vitals: BP 114/62  Pulse 72  Temp(Src) 98.2 F (36.8 C) (Oral)  Resp 20  SpO2 98%  Physical Exam  Nursing note and vitals reviewed. Constitutional: She is oriented to person, place, and time. She appears well-developed and well-nourished. No distress.  HENT:  Head: Normocephalic and atraumatic.  Eyes: EOM are normal.  Neck: Neck supple. No tracheal deviation present.  Cardiovascular: Normal rate.   Pulmonary/Chest: Effort normal. No respiratory distress.  Musculoskeletal: Normal range of motion.  Neurological: She is alert and oriented to person, place, and time.  Skin: Skin is warm and dry.  Biliary drain in RUQ, no surrounding cellulitis or signs of infection. Appears to be draining appropriately. However the security latch is broken   Psychiatric: She has a normal mood and affect. Her behavior is normal.    ED Course   Procedures (including critical care time)  DIAGNOSTIC STUDIES: Oxygen Saturation is 98% on room air, normal by my interpretation.    COORDINATION OF CARE:    Labs Reviewed - No data to display No results found. 1. Visit for wound check     MDM  Patient presents for drain hardware replacement. She states that the clamp on her drain broke. It was replaced with a new clamp the emergency department. No other complications. Drain is still intact, and working properly. Patient is stable discharge.   I personally performed the services described in this documentation, which was scribed in my presence. The recorded information has been reviewed and is accurate.     Roxy Horseman, PA-C 10/22/12 0002

## 2012-10-23 NOTE — ED Provider Notes (Signed)
Medical screening examination/treatment/procedure(s) were performed by non-physician practitioner and as supervising physician I was immediately available for consultation/collaboration.   [name]. Markeia Harkless, MD 10/23/12 2312

## 2012-10-24 ENCOUNTER — Encounter (INDEPENDENT_AMBULATORY_CARE_PROVIDER_SITE_OTHER): Payer: Self-pay | Admitting: General Surgery

## 2012-10-24 ENCOUNTER — Other Ambulatory Visit (INDEPENDENT_AMBULATORY_CARE_PROVIDER_SITE_OTHER): Payer: Self-pay | Admitting: General Surgery

## 2012-10-24 ENCOUNTER — Ambulatory Visit (INDEPENDENT_AMBULATORY_CARE_PROVIDER_SITE_OTHER): Payer: Medicare Other | Admitting: General Surgery

## 2012-10-24 ENCOUNTER — Ambulatory Visit (HOSPITAL_COMMUNITY)
Admission: RE | Admit: 2012-10-24 | Discharge: 2012-10-24 | Disposition: A | Discharge status: Home / Self Care | Payer: Medicare Other | Source: Ambulatory Visit | Attending: General Surgery | Admitting: General Surgery

## 2012-10-24 ENCOUNTER — Other Ambulatory Visit (INDEPENDENT_AMBULATORY_CARE_PROVIDER_SITE_OTHER): Payer: Self-pay

## 2012-10-24 VITALS — BP 112/64 | HR 76 | Resp 16 | Ht 63.0 in | Wt 178.0 lb

## 2012-10-24 DIAGNOSIS — Z4682 Encounter for fitting and adjustment of non-vascular catheter: Secondary | ICD-10-CM | POA: Insufficient documentation

## 2012-10-24 DIAGNOSIS — K819 Cholecystitis, unspecified: Secondary | ICD-10-CM

## 2012-10-24 DIAGNOSIS — T85518A Breakdown (mechanical) of other gastrointestinal prosthetic devices, implants and grafts, initial encounter: Secondary | ICD-10-CM

## 2012-10-24 DIAGNOSIS — T85698A Other mechanical complication of other specified internal prosthetic devices, implants and grafts, initial encounter: Secondary | ICD-10-CM

## 2012-10-24 DIAGNOSIS — K8020 Calculus of gallbladder without cholecystitis without obstruction: Secondary | ICD-10-CM | POA: Insufficient documentation

## 2012-10-24 MED ORDER — IOHEXOL 300 MG/ML  SOLN
50.0000 mL | Freq: Once | INTRAMUSCULAR | Status: AC | PRN
  Administered 2012-10-24: 10 mL

## 2012-10-24 NOTE — Procedures (Signed)
Appropriately positioned and functioning chole tube.  No exchange performed.   Cholelithiasis.

## 2012-10-24 NOTE — Progress Notes (Addendum)
The patient comes in today feeling much better. She has a temperature of 97.7 F   . She states that she's had fevers up to 98 at home with her normal temperature resigning in the 96-97 range Fahrenheit. 3 days ago the patient accidentally stepped on her percutaneous cholecystostomy tube while getting out of the shower. It was tied on the drain support and also the suture causing the vertebrae. The patient came into the emergency roomm and they reassured her that the tube was probably left in place the gallbladder although the Holter was broken. Since that time the amount of drainage coming from her cholecystostomy tube has progressively gone down 12 the last 24 hours she is only draining 60 cc of fluid. Prior to the drain being accidentally pulled she was draining over 250 cc of bile per day.  In spite of the decrease in drainage from her cholecystostomy tube the patient does not have any increased abdominal pain, and certainly no peritonitis.  On examination today she's got normal bowel sounds. She is feeding. The drain site appears to be intact. The sutures are intact. The has not been flushed. She was instructed not to do so by the interventional radiology group.  My suspicion based on the decreasing drainage from the tube after he was accidentally pulled on that it has been displaced from the gallbladder. In spite of this I think the patient is doing fairly well therefore it may not require replacement. However I believe that we need to confirm that the tube has not been put on the gallbladder. If so that removal may be necessary without replacement at this time. We'll see the patient back in my office on 11/09/2012 followup. I will directly communicate with the family about replacing the tube or not after the cholecystogram has been performed.

## 2012-10-25 ENCOUNTER — Other Ambulatory Visit: Payer: Medicare Other | Admitting: Lab

## 2012-10-25 ENCOUNTER — Telehealth (INDEPENDENT_AMBULATORY_CARE_PROVIDER_SITE_OTHER): Payer: Self-pay | Admitting: *Deleted

## 2012-10-25 ENCOUNTER — Other Ambulatory Visit (HOSPITAL_BASED_OUTPATIENT_CLINIC_OR_DEPARTMENT_OTHER): Payer: Medicare Other | Admitting: Lab

## 2012-10-25 ENCOUNTER — Encounter: Payer: Self-pay | Admitting: Vascular Surgery

## 2012-10-25 ENCOUNTER — Ambulatory Visit: Payer: Medicare Other | Admitting: Hematology & Oncology

## 2012-10-25 DIAGNOSIS — D45 Polycythemia vera: Secondary | ICD-10-CM

## 2012-10-25 LAB — CBC WITH DIFFERENTIAL (CANCER CENTER ONLY)
BASO%: 0.3 % (ref 0.0–2.0)
EOS%: 1.3 % (ref 0.0–7.0)
LYMPH%: 10.2 % — ABNORMAL LOW (ref 14.0–48.0)
MCH: 17.4 pg — ABNORMAL LOW (ref 26.0–34.0)
MCV: 69 fL — ABNORMAL LOW (ref 81–101)
MONO%: 15.7 % — ABNORMAL HIGH (ref 0.0–13.0)
Platelets: 245 10*3/uL (ref 145–400)
RDW: 25.1 % — ABNORMAL HIGH (ref 11.1–15.7)

## 2012-10-25 NOTE — Telephone Encounter (Signed)
Patient's husband called to state that the test Dr. Lindie Spruce sent patient for was negative.  Husband is asking what the plan forward is and what the plan for surgery is.  Explained to husband that this RN will send Dr. Lindie Spruce a message to ask what his plan is and as soon as we hear back we will give him a call.  Husband states they have appts today and tomorrow and will not be home until late afternoon.  Husband states understanding and agreeable with plan to await Dr. Dixon Boos response.

## 2012-10-26 ENCOUNTER — Telehealth (INDEPENDENT_AMBULATORY_CARE_PROVIDER_SITE_OTHER): Payer: Self-pay

## 2012-10-26 ENCOUNTER — Ambulatory Visit (INDEPENDENT_AMBULATORY_CARE_PROVIDER_SITE_OTHER): Payer: Medicare Other | Admitting: Vascular Surgery

## 2012-10-26 ENCOUNTER — Encounter: Payer: Self-pay | Admitting: Vascular Surgery

## 2012-10-26 ENCOUNTER — Ambulatory Visit (HOSPITAL_BASED_OUTPATIENT_CLINIC_OR_DEPARTMENT_OTHER): Payer: Medicare Other

## 2012-10-26 DIAGNOSIS — I6529 Occlusion and stenosis of unspecified carotid artery: Secondary | ICD-10-CM

## 2012-10-26 DIAGNOSIS — Z48812 Encounter for surgical aftercare following surgery on the circulatory system: Secondary | ICD-10-CM

## 2012-10-26 DIAGNOSIS — D45 Polycythemia vera: Secondary | ICD-10-CM

## 2012-10-26 HISTORY — DX: Occlusion and stenosis of unspecified carotid artery: I65.29

## 2012-10-26 NOTE — Telephone Encounter (Signed)
Called patients husband back. I let him know no surgery for at least a month and made appointment for them to come back. He states there was nothing draining in drain from Tuesday when she had drain study until this morning. She had 5 cc drain today. Advised I would let Dr Lindie Spruce know.

## 2012-10-26 NOTE — Patient Instructions (Signed)
Therapeutic Phlebotomy Therapeutic phlebotomy is the controlled removal of blood from your body for the purpose of treating a medical condition. It is similar to donating blood. Usually, about a pint (470 mL) of blood is removed. The average adult has 9 to 12 pints (4.3 to 5.7 L) of blood. Therapeutic phlebotomy may be used to treat the following medical conditions:  Hemochromatosis. This is a condition in which there is too much iron in the blood.  Polycythemia vera. This is a condition in which there are too many red cells in the blood.  Porphyria cutanea tarda. This is a disease usually passed from one generation to the next (inherited). It is a condition in which an important part of hemoglobin is not made properly. This results in the build up of abnormal amounts of porphyrins in the body.  Sickle cell disease. This is an inherited disease. It is a condition in which the red blood cells form an abnormal crescent shape rather than a round shape. LET YOUR CAREGIVER KNOW ABOUT:  Allergies.  Medicines taken including herbs, eyedrops, over-the-counter medicines, and creams.  Use of steroids (by mouth or creams).  Previous problems with anesthetics or numbing medicine.  History of blood clots.  History of bleeding or blood problems.  Previous surgery.  Possibility of pregnancy, if this applies. RISKS AND COMPLICATIONS This is a simple and safe procedure. Problems are unlikely. However, problems can occur and may include:  Nausea or lightheadedness.  Low blood pressure.  Soreness, bleeding, swelling, or bruising at the needle insertion site.  Infection. BEFORE THE PROCEDURE  This is a procedure that can be done as an outpatient. Confirm the time that you need to arrive for your procedure. Confirm whether there is a need to fast or withhold any medications. It is helpful to wear clothing with sleeves that can be raised above the elbow. A blood sample may be done to determine the  amount of red blood cells or iron in your blood. Plan ahead of time to have someone drive you home after the procedure. PROCEDURE The entire procedure from preparation through recovery takes about 1 hour. The actual collection takes about 10 to 15 minutes.  A needle will be inserted into your vein.  Tubing and a collection bag will be attached to that needle.  Blood will flow through the needle and tubing into the collection bag.  You may be asked to open and close your hand slowly and continuously during the entire collection.  Once the specified amount of blood has been removed from your body, the collection bag and tubing will be clamped.  The needle will be removed.  Pressure will be held on the site of the needle insertion to stop the bleeding. Then a bandage will be placed over the needle insertion site. AFTER THE PROCEDURE  Your recovery will be assessed and monitored. If there are no problems, as an outpatient, you should be able to go home shortly after the procedure.  Document Released: 08/17/2010 Document Revised: 06/07/2011 Document Reviewed: 08/17/2010 [employer] Patient Information 2014 [employer].  

## 2012-10-26 NOTE — Addendum Note (Signed)
Addended by: Sharee Pimple on: 10/26/2012 03:57 PM   Modules accepted: Orders

## 2012-10-26 NOTE — Progress Notes (Signed)
Patient is a 67 year old female returns for postoperative followup today after left carotid endarterectomy July 15. This was done with a saphenous vein patch you to recent gallbladder infection. She had a seizure several days after discharge from the hospital. She was readmitted [facility] hospital and placed on Keppra. She has recovered since then with no further seizures. She still has some weakness and clumsiness of her right hand.  Physical exam:  Filed Vitals:   10/26/12 1048  BP: 107/68  Pulse: 68  Temp: 97.2 F (36.2 C)  TempSrc: Oral  Resp: 16  Height: 5\' 3"  (1.6 m)  Weight: 153 lb (69.4 kg)  SpO2: 100%   Neck: Well-healed incision no carotid bruits  Neuro: Tongue midline no facial asymmetry upper extremity right 4/5 with some subtle clumsiness left 5 over 5  Assessment: Recovering status post left carotid endarterectomy known moderate right carotid stenosis  Plan: Continue aspirin Plavix repeat carotid duplex 6 months time  Fabienne Bruns, MD [facility] Office: [phone] Pager: [phone]

## 2012-10-26 NOTE — Progress Notes (Signed)
[name] presents today for phlebotomy per MD orders. Phlebotomy procedure started at 1330 and ended at 1415. 500 grams removed. Patient observed for 30 minutes after procedure without any incident. Patient tolerated procedure well. Nourishments provided and husband at chairside.  Performed via PICC dressing changed per protocol.Marland Kitchen

## 2012-10-26 NOTE — Telephone Encounter (Signed)
Message copied by Brennan Bailey on Thu Oct 26, 2012  9:26 AM ------      Message from: Frederik Schmidt      Created: Wed Oct 25, 2012  4:14 PM      Regarding: Calling [name] to call the husband three time, no answer.  Wanted to tell him that no surgery planned for at least a month.  Will discuss at next clinic appointment. ------

## 2012-11-01 ENCOUNTER — Encounter: Payer: Self-pay | Admitting: Cardiology

## 2012-11-01 ENCOUNTER — Ambulatory Visit (INDEPENDENT_AMBULATORY_CARE_PROVIDER_SITE_OTHER): Payer: Medicare Other | Admitting: Cardiology

## 2012-11-01 VITALS — BP 118/64 | HR 76 | Ht 63.0 in | Wt 159.4 lb

## 2012-11-01 DIAGNOSIS — I119 Hypertensive heart disease without heart failure: Secondary | ICD-10-CM

## 2012-11-01 DIAGNOSIS — I6529 Occlusion and stenosis of unspecified carotid artery: Secondary | ICD-10-CM

## 2012-11-01 DIAGNOSIS — D45 Polycythemia vera: Secondary | ICD-10-CM

## 2012-11-01 NOTE — Progress Notes (Signed)
[name] Date of Birth:  1945-05-15 [facility] [address] [place], [place]  [address] [phone]         Fax   [phone]  History of Present Illness: This pleasant 67 year old Caucasian female is seen for a scheduled 1 year followup office visit.  She has a history of polycythemia vera and a history of hypothyroidism.  She has a history of aortic valve sclerosis and essential hypertension. She has atypical chest pain and does not have any history of ischemic heart disease.  She has a past history of PVCs. She was recently admitted with acute cholecystitis.  She has a cholecystostomy tube in place.  She is scheduled to see her surgeon Dr. Lindie Spruce soon to discuss timing of cholecystectomy.  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  She has had a recent left carotid endarterectomy.  She was recently readmitted to [facility] with some type of atypical focal seizure and is now on Keppra.  She does not have any history of ischemic heart disease.  She had an echocardiogram 10/04/12 showing ejection fraction 65-70% with grade 1 diastolic dysfunction.  There was elevated pulmonary artery pressure of 53 mm mercury.  Current Outpatient Prescriptions  Medication Sig Dispense Refill  . albuterol-ipratropium (COMBIVENT) 18-103 MCG/ACT inhaler Inhale 2 puffs into the lungs every 6 (six) hours as needed for wheezing.      Marland Kitchen ALPRAZolam (XANAX) 0.5 MG tablet Take 0.5 mg by mouth at bedtime.      Marland Kitchen aspirin 325 MG tablet Take 325 mg by mouth every evening.       . bisoprolol (ZEBETA) 10 MG tablet Take 10 mg by mouth daily.      . cholecalciferol (VITAMIN D) 1000 UNITS tablet Take 3,000 Units by mouth daily.       . clopidogrel (PLAVIX) 75 MG tablet Take 75 mg by mouth daily.      . cyclobenzaprine (FLEXERIL) 10 MG tablet Take 10 mg by mouth 3 (three) times daily as needed for muscle spasms.       . diphenhydrAMINE (BENADRYL) 25 MG  tablet Take 50 mg by mouth every 8 (eight) hours as needed.       . docusate sodium (COLACE) 100 MG capsule Take 100 mg by mouth 3 (three) times daily.      Marland Kitchen estrogens, conjugated, (PREMARIN) 0.625 MG tablet Take 0.625 mg by mouth daily.       . famotidine (PEPCID) 20 MG tablet Take 1 tablet (20 mg total) by mouth 2 (two) times daily.  60 tablet  11  . fexofenadine (ALLEGRA) 180 MG tablet Take 180 mg by mouth daily.        . fluoxymesterone (ANDROXY) 10 MG tablet Take 2.5 mg by mouth daily as needed (before sexual activity).       . furosemide (LASIX) 40 MG tablet Take 80 mg by mouth daily.      . Garlic Oil 1000 MG CAPS Take 1 capsule by mouth daily.       Marland Kitchen levETIRAcetam (KEPPRA) 500 MG tablet Take 1 tablet (500 mg total) by mouth 2 (two) times daily.  60 tablet  0  . lidocaine-prilocaine (EMLA) cream Apply topically as needed. Apply to phlebotomy site at least one hour prior to phlebotomy.  30 g  0  . lisinopril (PRINIVIL,ZESTRIL) 10 MG tablet TAKE 1 TABLET BY MOUTH TWICE DAILY  60 tablet  0  . magnesium oxide (  MAG-OX) 400 MG tablet Take 1,200 mg by mouth daily.        . mineral oil liquid Take 30 mLs by mouth at bedtime.      . ondansetron (ZOFRAN ODT) 4 MG disintegrating tablet Take 1 tablet (4 mg total) by mouth every 8 (eight) hours as needed for nausea.  30 tablet  1  . oxyCODONE (OXYCONTIN) 10 MG 12 hr tablet Take 10 mg by mouth every 12 (twelve) hours as needed for pain. Brand name medically necessary.      Marland Kitchen oxyCODONE (OXYCONTIN) 20 MG 12 hr tablet Take 20 mg by mouth every 8 (eight) hours. Brand name medically necessary. Patient specifies that she takes this at 12 noon, 8pm & 4am (scheduled)      . potassium chloride (K-DUR) 10 MEQ tablet Take 4 tablets (40 mEq total) by mouth daily.      . promethazine (PHENERGAN) 25 MG suppository Place 25 mg rectally every 6 (six) hours as needed for nausea.      Marland Kitchen ROXICODONE 15 MG immediate release tablet Take 1 tablet (15 mg total) by mouth every  8 (eight) hours as needed for pain. Brand name medically necessary.  90 tablet  0  . Sennosides (SENNA LAX PO) Take 1 tablet by mouth 3 (three) times daily.       . temazepam (RESTORIL) 30 MG capsule Take 30 mg by mouth at bedtime.       Marland Kitchen thyroid (ARMOUR) 60 MG tablet Take 150 mg by mouth daily before breakfast. Levothyroxine is not effective. Takes only armour thyroid.      Marland Kitchen UNABLE TO FIND Out-patient physical therapy  Diagnosis: CVA, deconditioning  1 Mutually Defined  0  . Valerian 100 MG CAPS Take 1 capsule by mouth daily as needed (for sleep).        No current facility-administered medications for this visit.    Allergies  Allergen Reactions  . Codeine Nausea Only  . Synthroid (Levothyroxine Sodium) Other (See Comments)    Not effective. Causes excessive sleepiness. Takes armour thyroid  . Calcium Channel Blockers Palpitations  . Penicillins Rash    Patient Active Problem List   Diagnosis Date Noted  . Occlusion and stenosis of carotid artery without mention of cerebral infarction 10/26/2012  . Cholecystostomy tube dysfunction 10/24/2012  . 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 Acute cholecystitis 10/07/2012  . CVA (cerebral infarction) 10/05/2012  . Septic shock 10/02/2012  . CAD (coronary artery disease) 09/30/2012  . Cholelithiases 09/30/2012  . Unspecified constipation 09/30/2012  . Bronchitis 03/08/2011  . Benign hypertensive heart disease without heart failure 09/28/2010  . Aortic valve sclerosis 09/28/2010  . Hypothyroidism 09/28/2010  . Polycythemia vera(238.4) 09/28/2010  . Postmenopausal state 09/28/2010  . Fibromyalgia 09/28/2010    History  Smoking status  . Never Smoker   Smokeless tobacco  . Never Used    History  Alcohol Use No    Family History  Problem Relation Age of Onset  . Heart attack Father   . Coronary artery disease Mother     had aortic valve replacement    Review of Systems: Constitutional: no fever chills  diaphoresis or fatigue or change in weight.  Head and neck: no hearing loss, no epistaxis, no photophobia or visual disturbance. Respiratory: No cough, shortness of breath or wheezing. Cardiovascular: No chest pain peripheral edema, palpitations. Gastrointestinal: No abdominal distention, no abdominal pain, no change in bowel habits hematochezia or melena. Genitourinary: No dysuria, no frequency, no urgency,  no nocturia. Musculoskeletal:No arthralgias, no back pain, no gait disturbance or myalgias. Neurological: No dizziness, no headaches, no numbness, no seizures, no syncope, no weakness, no tremors. Hematologic: No lymphadenopathy, no easy bruising. Psychiatric: No confusion, no hallucinations, no sleep disturbance.    Physical Exam: Filed Vitals:   11/01/12 1607  BP: 118/64  Pulse: 76   the general appearance reveals a well-developed well-nourished woman in no distress.  She has marked chronic alopecia.  The left carotid endarterectomy scar is healing well.The head and neck exam reveals pupils equal and reactive.  Extraocular movements are full.  There is no scleral icterus.  The mouth and pharynx are normal.  The neck is supple.  The carotids reveal no bruits.  The jugular venous pressure is normal.  The  thyroid is not enlarged.  There is no lymphadenopathy.  The chest is clear to percussion and auscultation.  There are no rales or rhonchi.  Expansion of the chest is symmetrical.  The precordium is quiet.  The first heart sound is normal.  The second heart sound is physiologically split.  There is no murmur gallop rub or click.  There is no abnormal lift or heave.  The abdomen is soft and nontender.  There is a right cholecystostomy tube.  The bowel sounds are normal.  The liver and spleen are not enlarged.  There are no abdominal masses.  There are no abdominal bruits.  Extremities reveal good pedal pulses.  There is no phlebitis or edema.  There is no cyanosis or clubbing.  Strength is  normal and symmetrical in all extremities.  There is no lateralizing weakness.  There are no sensory deficits.  The skin is warm and dry.  There is no rash.     Assessment / Plan: From a cardiac standpoint she is doing well.  Continue same medication.  Recheck here in 3 months for office visit and EKG.

## 2012-11-01 NOTE — Assessment & Plan Note (Signed)
The patient has a history of polycythemia with hyperviscosity syndrome.  She is followed closely by Dr. [name] who tries to keep her hemoglobin around 6 or less

## 2012-11-01 NOTE — Patient Instructions (Addendum)
Your physician recommends that you continue on your current medications as directed. Please refer to the Current Medication list given to you today.  Your physician recommends that you schedule a follow-up appointment in: 3 months  

## 2012-11-01 NOTE — Assessment & Plan Note (Signed)
The patient is making a good recovery from her recent left brain stroke.  She is getting physical therapy occupational therapy and speech therapy at home.  She is practicing writing as therapy to improve the clumsiness of her right hand.  She has had no further seizures.

## 2012-11-01 NOTE — Assessment & Plan Note (Signed)
Blood pressure was remaining stable on current therapy.  No symptoms of CHF.  No palpitations.  No chest pain.

## 2012-11-07 ENCOUNTER — Other Ambulatory Visit (HOSPITAL_COMMUNITY): Payer: Self-pay | Admitting: Cardiology

## 2012-11-07 ENCOUNTER — Other Ambulatory Visit: Payer: Self-pay

## 2012-11-07 MED ORDER — LEVETIRACETAM 500 MG PO TABS: 500.0000 mg | ORAL_TABLET | Freq: Two times a day (BID) | ORAL | Status: DC

## 2012-11-08 ENCOUNTER — Other Ambulatory Visit (HOSPITAL_BASED_OUTPATIENT_CLINIC_OR_DEPARTMENT_OTHER): Payer: Medicare Other | Admitting: Lab

## 2012-11-08 ENCOUNTER — Ambulatory Visit (HOSPITAL_BASED_OUTPATIENT_CLINIC_OR_DEPARTMENT_OTHER): Payer: Medicare Other

## 2012-11-08 ENCOUNTER — Ambulatory Visit (HOSPITAL_BASED_OUTPATIENT_CLINIC_OR_DEPARTMENT_OTHER): Payer: Medicare Other | Admitting: Hematology & Oncology

## 2012-11-08 VITALS — BP 120/64 | HR 72 | Temp 97.2°F | Resp 20

## 2012-11-08 DIAGNOSIS — R1011 Right upper quadrant pain: Secondary | ICD-10-CM

## 2012-11-08 DIAGNOSIS — K810 Acute cholecystitis: Secondary | ICD-10-CM

## 2012-11-08 DIAGNOSIS — T148XXA Other injury of unspecified body region, initial encounter: Secondary | ICD-10-CM

## 2012-11-08 DIAGNOSIS — D45 Polycythemia vera: Secondary | ICD-10-CM

## 2012-11-08 DIAGNOSIS — M797 Fibromyalgia: Secondary | ICD-10-CM

## 2012-11-08 DIAGNOSIS — Z8673 Personal history of transient ischemic attack (TIA), and cerebral infarction without residual deficits: Secondary | ICD-10-CM

## 2012-11-08 LAB — CBC WITH DIFFERENTIAL (CANCER CENTER ONLY)
BASO%: 0 % (ref 0.0–2.0)
LYMPH#: 0.8 10*3/uL — ABNORMAL LOW (ref 0.9–3.3)
LYMPH%: 11.9 % — ABNORMAL LOW (ref 14.0–48.0)
MCV: 68 fL — ABNORMAL LOW (ref 81–101)
MONO#: 0.8 10*3/uL (ref 0.1–0.9)
NEUT#: 5.1 10*3/uL (ref 1.5–6.5)
Platelets: 268 10*3/uL (ref 145–400)
RDW: 22.4 % — ABNORMAL HIGH (ref 11.1–15.7)
WBC: 6.8 10*3/uL (ref 3.9–10.0)

## 2012-11-08 MED ORDER — LEVOFLOXACIN IN D5W 750 MG/150ML IV SOLN
750.0000 mg | Freq: Once | INTRAVENOUS | Status: AC
  Administered 2012-11-08: 750 mg via INTRAVENOUS
  Filled 2012-11-08: qty 150

## 2012-11-08 MED ORDER — OXYCODONE HCL 20 MG PO TB12: 20.0000 mg | ORAL_TABLET | Freq: Three times a day (TID) | ORAL | Status: DC

## 2012-11-08 MED ORDER — ROXICODONE 15 MG PO TABS: 15.0000 mg | ORAL_TABLET | Freq: Three times a day (TID) | ORAL | Status: DC | PRN

## 2012-11-08 MED ORDER — OXYCODONE HCL 10 MG PO TB12: 10.0000 mg | ORAL_TABLET | Freq: Two times a day (BID) | ORAL | Status: DC | PRN

## 2012-11-08 MED ORDER — LEVOFLOXACIN 750 MG PO TABS: 750.0000 mg | ORAL_TABLET | Freq: Every day | ORAL | Status: DC

## 2012-11-08 NOTE — Patient Instructions (Signed)
Levofloxacin injection What is this medicine? LEVOFLOXACIN (lee voe FLOX a sin) is a quinolone antibiotic. It is used to treat certain kinds of bacterial infections. It will not work for colds, flu, or other viral infections. This medicine may be used for other purposes; ask your health care provider or pharmacist if you have questions. What should I tell my health care provider before I take this medicine? They need to know if you have any of these conditions: -cerebral disease -irregular heartbeat -joint problems -kidney disease -myasthenia gravis -seizure disorder -an unusual or allergic reaction to levofloxacin, other quinolone antibiotics, foods, dyes, or preservatives -pregnant or trying to get pregnant -breast-feeding How should I use this medicine? This medicine is for infusion into a vein. It is usually given by a health care professional in a hospital or clinic setting. If you get this medicine at home, you will be taught how to prepare and give this medicine. Use exactly as directed. Take your medicine at regular intervals. Do not take your medicine more often than directed. It is important that you put your used needles and syringes in a special sharps container. Do not put them in a trash can. If you do not have a sharps container, call your pharmacist or healthcare provider to get one. A special MedGuide will be given to you by the pharmacist with each prescription and refill. Be sure to read this information carefully each time. Talk to your pediatrician regarding the use of this medicine in children. While this drug may be prescribed for children as young as 6 months for selected conditions, precautions do apply. Overdosage: If you think you have taken too much of this medicine contact a poison control center or emergency room at once. NOTE: This medicine is only for you. Do not share this medicine with others. What if I miss a dose? If you miss a dose, use it as soon as you  can. If it is almost time for your next dose, use only that dose. Do not use double or extra doses. What may interact with this medicine? Do not take this medicine with any of the following medications -arsenic trioxide -chloroquine -droperidol -medications for irregular rhythm like amiodarone, disopyramide, dofetilide, flecainide, quinidine, procainamide, sotalol -some medicines for depression or mental problems like phenothiazines, pimozide, and ziprasidone This medicine may also interact with the following medications -amoxapine -cisapride -haloperidol -retinoid products like tretinoin or isotretinoin -risperidone -some other antibiotics like clarithromycin or erythromycin -theophylline -warfarin This list may not describe all possible interactions. Give your health care provider a list of all the medicines, herbs, non-prescription drugs, or dietary supplements you use. Also tell them if you smoke, drink alcohol, or use illegal drugs. Some items may interact with your medicine. What should I watch for while using this medicine? Tell your doctor or health care professional if your symptoms do not begin to improve in a few days. Drink several glasses of water a day and cut down on drinks that contain caffeine. You must not get dehydrated while taking this medicine. You may get drowsy or dizzy. Do not drive, use machinery, or do anything that needs mental alertness until you know how this medicine affects you. Do not sit or stand up quickly, especially if you are an older patient. This reduces the risk of dizzy or fainting spells. This medicine can make you more sensitive to the sun. Keep out of the sun. If you cannot avoid being in the sun, wear protective clothing and use a  sunscreen. Do not use sun lamps or tanning beds/booths. Contact your doctor if you get a sunburn. If you are a diabetic monitor your blood glucose carefully. If you get an unusual reading stop taking this medicine and call  your doctor right away. Do not treat diarrhea with over-the-counter products. Contact your doctor if you have diarrhea that lasts more than 2 days or if the diarrhea is severe and watery. What side effects may I notice from receiving this medicine? Side effects that you should report to your doctor or health care professional as soon as possible: -allergic reactions like skin rash or hives, swelling of the face, lips, or tongue -changes in vision -confusion, nightmares or hallucinations -difficulty breathing -irregular heartbeat, chest pain -joint, muscle or tendon pain -pain or difficulty passing urine -redness, blistering, peeling or loosening of the skin, including inside the mouth -seizures -unusual pain, numbness, tingling, or weakness -vaginal irritation, discharge Side effects that usually do not require medical attention (report to your doctor or health care professional if they continue or are bothersome): -diarrhea -dry mouth -headache -pain, irritation at the site of injection -stomach upset, nausea -trouble sleeping This list may not describe all possible side effects. Call your doctor for medical advice about side effects. You may report side effects to FDA at 1-800-FDA-1088. Where should I keep my medicine? Keep out of the reach of children. If you are using this medicine at home, you will be instructed on how to store this medicine. Throw away any unused medicine after the expiration date on the label. NOTE: This sheet is a summary. It may not cover all possible information. If you have questions about this medicine, talk to your doctor, pharmacist, or health care provider.  2012, [employer]vier/[employer] Standard. (06/02/2009 11:48:01 AM)

## 2012-11-08 NOTE — Progress Notes (Signed)
This office note has been dictated.

## 2012-11-08 NOTE — Addendum Note (Signed)
Addended by: Arlan Organ R on: 11/08/2012 04:45 PM   Modules accepted: Orders

## 2012-11-08 NOTE — Addendum Note (Signed)
Addended by: Arlan Organ R on: 11/08/2012 04:03 PM   Modules accepted: Orders, Medications

## 2012-11-09 ENCOUNTER — Encounter (INDEPENDENT_AMBULATORY_CARE_PROVIDER_SITE_OTHER): Payer: Self-pay | Admitting: General Surgery

## 2012-11-09 ENCOUNTER — Other Ambulatory Visit: Payer: Self-pay | Admitting: *Deleted

## 2012-11-09 ENCOUNTER — Ambulatory Visit (INDEPENDENT_AMBULATORY_CARE_PROVIDER_SITE_OTHER): Payer: Medicare Other | Admitting: General Surgery

## 2012-11-09 VITALS — BP 130/84 | HR 80 | Resp 16 | Ht 63.0 in | Wt 151.8 lb

## 2012-11-09 DIAGNOSIS — D45 Polycythemia vera: Secondary | ICD-10-CM

## 2012-11-09 DIAGNOSIS — K810 Acute cholecystitis: Secondary | ICD-10-CM

## 2012-11-09 NOTE — Progress Notes (Signed)
The patient has cloudy bilious drainage from her percutaneous gallbladder drain. Her oncologist is sent this for culture and given her a parenteral dose  Of Levaquin. He also order a prescription for oral Levaquin. We will continue that until the patient has her gallbladder removed likely the first week of September.  The patient has been told by her cardiologist that she is clear for surgery. That is Dr. Brackbill. She has also been clear for surgery by her oncologist. That is Dr. Ennever. She will need a preoperative anesthesia consultation.  On examination today the patient has discomfort around the drain entrance site. It is slightly red. She states that some purulent drainage has come from that site. Currently there is no drainage. A new dressing will be applied.  We plan to do the patient's operation the first week of September. 

## 2012-11-09 NOTE — Progress Notes (Signed)
DIAGNOSIS: 1. Polycythemia vera-hyperviscosity variant. 2. 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 3. Acute cholecystitis-status post cholecystotomy tube.  CURRENT THERAPY:  Phlebotomy to maintain hemoglobin between 5.5-6.  INTERIM HISTORY:  Ms. Riester comes in for followup.  She was hospitalized recently at [facility].  She came in with acute cholecystitis.  She subsequently developed a CVA.  This was in the left frontoparietal region.  MRA showed that she had a high-grade left internal carotid artery stenosis.  She subsequently underwent a carotid endarterectomy. She did have a seizure.  This was probably more secondary to re- perfusion.  She is on Keppra right now.  She did have a cholecystotomy tube placed.  She sees Dr. Cliffton Asters of surgery tomorrow.  Those will determine when a cholecystectomy can be done.  She is doing pretty well.  She seems to have recovered from this CVA. There may be some slight right-sided weakness.  She is talking pretty well.  She has had no double vision or blurred vision.  She has had no fever.  There is milky discharge coming from the cholecystotomy tube.  We went ahead and cultured this.  The patient is having some slight right upper quadrant abdominal pain which is more chronic.  She is having no diarrhea.  There has been no urinary issues.  She has had no leg swelling.  She has had no cough or shortness of breath.  Overall, her performance status is ECOG 1.  PHYSICAL EXAMINATION:  General:  This is a fairly well-developed, well- nourished white female in no obvious distress.  Vital signs: Temperature of 97.2, pulse 72, respiratory rate 20, blood pressure 120/64.  Weight was not taken.  Head and neck:  Normocephalic, atraumatic skull.  There is no scleral icterus.  There is no oral lesions.  There is no adenopathy in the neck.  Lungs:  Clear bilaterally.  Cardiac:  Regular rate  and rhythm with a normal S1, S2. She has a 1/6 systolic ejection murmur.  Abdomen:  Soft.  She has decent bowel sounds.  She has a cholecystotomy tube in the right upper quadrant.  Again, there is this yellowish, milky fluid in the bag.  She has some slight tenderness in the right upper quadrant to palpation. There is no splenomegaly.  Extremities:  Show no clubbing, cyanosis or edema.  Neurological:  Shows maybe some slight right upper extremity weakness.  LABORATORIES:  White cell count 6.8, hemoglobin 6.3, hematocrit 25.5, platelet count 286.  IMPRESSION:  Ms. Morace is a 67 year old white female with polycythemia vera.  She has a hyperviscosity variant.  We have been keeping her hemoglobin below 6 which has worked very well for her.  I think at 6.3 today, we are okay not to phlebotomize her.  I am worried about this discharge from the cholecystotomy tube.  Again, we are culturing it.  I am going to give her a dose of Levaquin in the office.  I want to make sure she gets Levaquin as an outpatient just to be proactive.  From my point of view, I do not see any problems with her having her gallbladder taken out.  I do not think her blood is going to be much of an issue.  We are following her blood work every couple weeks.  I will plan to see her back myself in about a month.  Again, if there are any issues with respect to having surgery, I will be more than  happy to help out.    ______________________________ Josph Macho, M.D. PRE/MEDQ  D:  11/08/2012  T:  11/09/2012  Job:  [id number]

## 2012-11-10 ENCOUNTER — Telehealth (INDEPENDENT_AMBULATORY_CARE_PROVIDER_SITE_OTHER): Payer: Self-pay | Admitting: General Surgery

## 2012-11-10 NOTE — Telephone Encounter (Signed)
Pt seen today by Romeo Apple, physical therapist with [facility].  He called to alert Dr. Lindie Spruce to her increased Rt flank pain.  He understands she was seen yesterday in clinic and there was pain then, but she states it is worse today.  No fever or increased redness.

## 2012-11-13 ENCOUNTER — Telehealth (HOSPITAL_COMMUNITY): Payer: Self-pay | Admitting: Emergency Medicine

## 2012-11-13 NOTE — Telephone Encounter (Signed)
Referred to Dr. Dixon Boos nurse.

## 2012-11-14 ENCOUNTER — Telehealth (INDEPENDENT_AMBULATORY_CARE_PROVIDER_SITE_OTHER): Payer: Self-pay

## 2012-11-14 DIAGNOSIS — D45 Polycythemia vera: Secondary | ICD-10-CM

## 2012-11-14 DIAGNOSIS — K810 Acute cholecystitis: Secondary | ICD-10-CM

## 2012-11-14 DIAGNOSIS — T148XXA Other injury of unspecified body region, initial encounter: Secondary | ICD-10-CM

## 2012-11-14 DIAGNOSIS — R1011 Right upper quadrant pain: Secondary | ICD-10-CM

## 2012-11-14 MED ORDER — LEVOFLOXACIN 750 MG PO TABS: 750.0000 mg | ORAL_TABLET | Freq: Every day | ORAL | Status: DC

## 2012-11-14 NOTE — Telephone Encounter (Signed)
I called [name] back and let him know I will send refill for levofloxacin into pharmacy per Dr Lindie Spruce office note.

## 2012-11-14 NOTE — Telephone Encounter (Signed)
Called Huntley Dec back and deferred dressing changes to her PCP or whoever is doing lab draws per Dr Lindie Spruce. Pt to go to radiology tomorrow for securement devise replacement.

## 2012-11-14 NOTE — Telephone Encounter (Signed)
I called and spoke to [name] and let him know I called radiology regarding her securement devise. I explained they could replace it anytime tomorrow between 9 and 12. He said they had a new one placed yesterday but that that one was the last extra they had. He is concerned it will come loose with any drainage that may happen and they don't have any extra to replace it with. I explained I gave Tiffany at radiology from [facility] her name and the PA will have a look at the devise and see if it has come loose and could supply extra if needed. He will call with any questions or concerns.

## 2012-11-15 ENCOUNTER — Other Ambulatory Visit: Payer: Self-pay | Admitting: Cardiology

## 2012-11-15 ENCOUNTER — Other Ambulatory Visit: Payer: Medicare Other | Admitting: Lab

## 2012-11-16 ENCOUNTER — Ambulatory Visit (HOSPITAL_BASED_OUTPATIENT_CLINIC_OR_DEPARTMENT_OTHER): Payer: Medicare Other

## 2012-11-16 VITALS — BP 96/62 | HR 82 | Temp 97.0°F | Resp 16

## 2012-11-16 DIAGNOSIS — D45 Polycythemia vera: Secondary | ICD-10-CM

## 2012-11-16 NOTE — Patient Instructions (Addendum)
Therapeutic Phlebotomy Therapeutic phlebotomy is the controlled removal of blood from your body for the purpose of treating a medical condition. It is similar to donating blood. Usually, about a pint (470 mL) of blood is removed. The average adult has 9 to 12 pints (4.3 to 5.7 L) of blood. Therapeutic phlebotomy may be used to treat the following medical conditions:  Hemochromatosis. This is a condition in which there is too much iron in the blood.  Polycythemia vera. This is a condition in which there are too many red cells in the blood.  Porphyria cutanea tarda. This is a disease usually passed from one generation to the next (inherited). It is a condition in which an important part of hemoglobin is not made properly. This results in the build up of abnormal amounts of porphyrins in the body.  Sickle cell disease. This is an inherited disease. It is a condition in which the red blood cells form an abnormal crescent shape rather than a round shape. LET YOUR CAREGIVER KNOW ABOUT:  Allergies.  Medicines taken including herbs, eyedrops, over-the-counter medicines, and creams.  Use of steroids (by mouth or creams).  Previous problems with anesthetics or numbing medicine.  History of blood clots.  History of bleeding or blood problems.  Previous surgery.  Possibility of pregnancy, if this applies. RISKS AND COMPLICATIONS This is a simple and safe procedure. Problems are unlikely. However, problems can occur and may include:  Nausea or lightheadedness.  Low blood pressure.  Soreness, bleeding, swelling, or bruising at the needle insertion site.  Infection. BEFORE THE PROCEDURE  This is a procedure that can be done as an outpatient. Confirm the time that you need to arrive for your procedure. Confirm whether there is a need to fast or withhold any medications. It is helpful to wear clothing with sleeves that can be raised above the elbow. A blood sample may be done to determine the  amount of red blood cells or iron in your blood. Plan ahead of time to have someone drive you home after the procedure. PROCEDURE The entire procedure from preparation through recovery takes about 1 hour. The actual collection takes about 10 to 15 minutes.  A needle will be inserted into your vein.  Tubing and a collection bag will be attached to that needle.  Blood will flow through the needle and tubing into the collection bag.  You may be asked to open and close your hand slowly and continuously during the entire collection.  Once the specified amount of blood has been removed from your body, the collection bag and tubing will be clamped.  The needle will be removed.  Pressure will be held on the site of the needle insertion to stop the bleeding. Then a bandage will be placed over the needle insertion site. AFTER THE PROCEDURE  Your recovery will be assessed and monitored. If there are no problems, as an outpatient, you should be able to go home shortly after the procedure.  Document Released: 08/17/2010 Document Revised: 06/07/2011 Document Reviewed: 08/17/2010 Exit[employer] Patient Information 2014 [employer].  

## 2012-11-16 NOTE — Progress Notes (Signed)
[name] presents today for phlebotomy per MD orders. Phlebotomy procedure started at 1250 and ended at 1310. 500 grams removed. Patient observed for 30 minutes after procedure without any incident. Patient tolerated procedure well. IV needle removed intact.

## 2012-11-17 ENCOUNTER — Encounter (HOSPITAL_COMMUNITY): Payer: Self-pay

## 2012-11-17 ENCOUNTER — Telehealth: Payer: Self-pay | Admitting: *Deleted

## 2012-11-20 NOTE — Pre-Procedure Instructions (Signed)
[name]  11/20/2012   Your procedure is scheduled on:  Wed, Sept 3 @ 8:30 AM  Report to [facility] at 6:30 AM.  Call this number if you have problems the morning of surgery: [phone]   Remember:   Do not eat food or drink liquids after midnight.   Take these medicines the morning of surgery with A SIP OF WATER: Albuterol<Bring Your Inhaler With You>,Zithromax(Azithromycin),Bisoprolol(Zebeta),Keflex(Cephalexin),Pepcid(Famotidine),Keppra(Levetiracetam),Zofran(Ondansetron-if needed),Pain Pill(if needed),and Thyroid(Armour)                   Do not wear jewelry, make-up or nail polish.  Do not wear lotions, powders, or perfumes. You may wear deodorant.  Do not shave 48 hours prior to surgery.   Do not bring valuables to the hospital.  [facility] is not responsible                   for any belongings or valuables.  Contacts, dentures or bridgework may not be worn into surgery.  Leave suitcase in the car. After surgery it may be brought to your room.  For patients admitted to the hospital, checkout time is 11:00 AM the day of  discharge.   Patients discharged the day of surgery will not be allowed to drive  home.    Special Instructions: Shower using CHG 2 nights before surgery and the night before surgery.  If you shower the day of surgery use CHG.  Use special wash - you have one bottle of CHG for all showers.  You should use approximately 1/3 of the bottle for each shower.   Please read over the following fact sheets that you were given: Pain Booklet, Coughing and Deep Breathing, Blood Transfusion Information and Surgical Site Infection Prevention

## 2012-11-21 ENCOUNTER — Telehealth (HOSPITAL_COMMUNITY): Payer: Self-pay | Admitting: Emergency Medicine

## 2012-11-21 ENCOUNTER — Encounter (HOSPITAL_COMMUNITY)
Admission: RE | Admit: 2012-11-21 | Discharge: 2012-11-21 | Disposition: A | Discharge status: Home / Self Care | Payer: Medicare Other | Source: Ambulatory Visit | Attending: General Surgery | Admitting: General Surgery

## 2012-11-21 ENCOUNTER — Telehealth (INDEPENDENT_AMBULATORY_CARE_PROVIDER_SITE_OTHER): Payer: Self-pay | Admitting: *Deleted

## 2012-11-21 ENCOUNTER — Ambulatory Visit (HOSPITAL_COMMUNITY)
Admission: RE | Admit: 2012-11-21 | Discharge: 2012-11-21 | Disposition: A | Discharge status: Home / Self Care | Payer: Medicare Other | Source: Ambulatory Visit | Attending: Anesthesiology | Admitting: Anesthesiology

## 2012-11-21 ENCOUNTER — Encounter (HOSPITAL_COMMUNITY): Payer: Self-pay

## 2012-11-21 DIAGNOSIS — Z01812 Encounter for preprocedural laboratory examination: Secondary | ICD-10-CM | POA: Insufficient documentation

## 2012-11-21 DIAGNOSIS — Z01810 Encounter for preprocedural cardiovascular examination: Secondary | ICD-10-CM | POA: Insufficient documentation

## 2012-11-21 DIAGNOSIS — Z01818 Encounter for other preprocedural examination: Secondary | ICD-10-CM | POA: Insufficient documentation

## 2012-11-21 HISTORY — DX: Dorsalgia, unspecified: M54.9

## 2012-11-21 HISTORY — DX: Personal history of other medical treatment: Z92.89

## 2012-11-21 HISTORY — DX: Other constipation: K59.09

## 2012-11-21 HISTORY — DX: Other specified postprocedural states: Z98.890

## 2012-11-21 HISTORY — DX: Insomnia, unspecified: G47.00

## 2012-11-21 HISTORY — DX: Nausea with vomiting, unspecified: R11.2

## 2012-11-21 HISTORY — DX: Other seasonal allergic rhinitis: J30.2

## 2012-11-21 HISTORY — DX: Failed or difficult intubation, initial encounter: T88.4XXA

## 2012-11-21 HISTORY — DX: Other chronic pain: G89.29

## 2012-11-21 LAB — COMPREHENSIVE METABOLIC PANEL
ALT: 24 U/L (ref 0–35)
Alkaline Phosphatase: 304 U/L — ABNORMAL HIGH (ref 39–117)
CO2: 26 mEq/L (ref 19–32)
GFR calc Af Amer: >90 mL/min (ref >90)
GFR calc non Af Amer: >90 mL/min (ref >90)
Glucose, Bld: 132 mg/dL — ABNORMAL HIGH (ref 70–99)
Potassium: 4.1 mEq/L (ref 3.5–5.1)
Sodium: 136 mEq/L (ref 135–145)

## 2012-11-21 LAB — CBC WITH DIFFERENTIAL/PLATELET
Basophils Relative: 0 % (ref 0–1)
Eosinophils Relative: 0 % (ref 0–5)
HCT: 21.7 % — ABNORMAL LOW (ref 36.0–46.0)
Hemoglobin: 5.6 g/dL — CL (ref 12.0–15.0)
Lymphocytes Relative: 12 % (ref 12–46)
MCH: 16.3 pg — ABNORMAL LOW (ref 26.0–34.0)
Monocytes Absolute: 0.5 10*3/uL (ref 0.1–1.0)
Neutro Abs: 4 10*3/uL (ref 1.7–7.7)
Neutrophils Relative %: 78 % — ABNORMAL HIGH (ref 43–77)
RBC: 3.44 MIL/uL — ABNORMAL LOW (ref 3.87–5.11)

## 2012-11-21 NOTE — Progress Notes (Addendum)
Dr.Brackbill is cardiologist and Medical Md with last visit this month  Multiple echo reports in epic with last one in 2014  Denies ever having a heart cath or stress test  Hematologist Dr.Peter Inniver  EKG in epic from 10-04-12  Portable CXR in epic but not a full view

## 2012-11-21 NOTE — Telephone Encounter (Signed)
Husband called in today to report a change in the drainage coming from the gallbladder drain.  Husband states the drainage has changed to a dark green color from a cloudy bilious color on 11/09/12.  Explained to husband that a message will be sent to Dr. Lindie Spruce and Marcelino Duster CMA however bc Dr. Lindie Spruce is unavailable this RN will run the change by the urgent office MD to make sure nothing needs to be done today.  Husband states understanding and agreeable at this time.

## 2012-11-21 NOTE — Telephone Encounter (Signed)
Spoke to Dr. Luisa Hart at this time regarding the change in color of the drainage coming from the gallbladder drain.  He states this change is not concerning at this time.  Patient and husband have not gotten home from the pre-op appt at this time and there is no voicemail on the home phone. Will attempt to call them later to let them know we appreciate the update but it is not emergent.

## 2012-11-21 NOTE — Telephone Encounter (Signed)
Received a call from [facility] in Pre-op who wanted to make sure Dr. Lindie Spruce is aware of the H&H 5.6/21.7.  Explained that Dr. Lindie Spruce is unavailable however I will let Dr. Luisa Hart know of this.  Advised her to send the patient to the ED due to these results along with the conversation I had earlier with [facility].  She states understanding and agreeable at this time.

## 2012-11-21 NOTE — Progress Notes (Addendum)
Anesthesia PAT Evaluation:  Patient is a 67 year old female scheduled for laparoscopic cholecystectomy on 11/29/12 by Dr. Lindie Spruce.  She was hospitalized in for acute cholecystitis with sepsis and underwent percutaneous cholecystostomy in IR on 10/02/12 with plans for cholecystectomy 4-6 weeks later. Prior to discharge home she developed dysarthria and RUE weakness with MRI showing left MCA/ACA watershed infarcts with MRA showing markedly diminished flow and possible obstruction of the left ICA.  Carotid duplex on 10/06/12 showed 60-79% left ICA stenosis and 40-59% right ICA stenosis with antegrade vertebral flow.  She underwent left CEA with vein patch angioplasty on 10/10/12 and was discharged home on 10/13/12.  Unfortunately, she required re-admission on 10/14/12 for new onset seizures.  MRI showed no new ischemic event but did show left frontal and parietal cortical and subcortical brain edema which was felt likely related seizure activity or reperfusion injury following recent CEA.  She was started on Keppra and has had no further seizures. Her speech is now clear, and she now only has minimal RUE weakness.  Neurologist is Dr. Pearlean Brownie.  Anesthesia history: Post-operative N/V and difficult airway in 1990's.  She said airway management during her CEA on 10/10/12 worked well for her.  Records indicate "Grade 3 view w MAC3--Glidescope to intubate); Grade 3; Self; Endotracheal Tube; MAC, 3; Oral; 7 mm; Cuffed, Min.occ.pres., Air; 1; Customer service manager..."  History includes polycythemia vera-hyperviscosity variant treated with periodic phlebotomy (followed by Dr. 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  PCP/Cardiologist is Dr.  Patty Sermons, last visit 11/01/12.  He was aware that she would be undergoing cholecystectomy in the near future. His notes indicate that she does not have any known history of ischemic heart disease.    Dr. Gustavo Lah is aware of plans for surgery (see his note from 11/08/12).  Also according to his note from 10/02/12, "She is very viscous with her blood and has had problems with cerebrovascular issues if her hemoglobin gets above 7." She was diagnosed ~ 2002 after presenting with left visual changes. Her last phlebotomy was on 11/16/12.  Exam shows a pale, pleasant Caucasian female in NAD but appears frail.  She has very little hair. She did report that she has been feeling poorly over the past 1-2 days.  She does not think her drain is draining properly and the color has changed. She has not had a fever, but has been more nauseated with abdominal fullness.  She has had right upper chest/shoulder pains radiating to her back that are worse with movement of her drain.  She says the drainage is malodorous. Currently, the right sided abdominal drain dressing is CDI.  I did not note any strong odor.  Drain output is greenish-brown.  Heart RRR, no murmur noted.  Lungs clear.  Trace pedal edema.  LUE PICC line.  She is on multiple medications.  She states that Dr. Lindie Spruce instructed her to hold Plavix preoperatively but to continue ASA.  He did not instruct her when to hold.  Typically it is 5-7 days preoperatively, but I left a message with [facility] triage to clarify and let patient know.  EKG on 11/21/12 showed NSR, non-specific T wave abnormality.   Echo on 10/04/12 showed: - Left ventricle: The cavity size was normal. Wall thickness was increased in a pattern  of mild LVH. Systolic function was vigorous. The estimated ejection fraction was in the range of 65% to 70%. Wall motion was normal; there were no regional wall motion abnormalities. Doppler parameters are consistent with abnormal left ventricular relaxation (grade 1  diastolic dysfunction). - Aortic valve: Trileaflet.  Mildly thickened leaflets.  No stenosis. - Right atrium: The atrium was mildly dilated. - Pulmonary arteries: PA peak pressure: 53mm Hg (S). - Tricuspid valve: Mild regurgitation.  CXR on 11/21/12 showed no acute cardiopulmonary disease.  Left PICC is well positioned.  Pre-operative labs noted.  Critical H/H already called to [facility] triage RN by the PAT RN.  This result is expected considering her history and hematology notes.  She is actually scheduled to go to the [facility] tomorrow to review labs (she is seen there every two weeks). According to her PAT RN, patient reported a transfusion within the past three months so her T&S could not be done until the day of surgery.  With her significant anemia, I will also order a T&C for 2 Units to have available if needed.  I have reviewed above history with anesthesiologist Dr. Michelle Piper. Prior to patient leaving PAT, I also called and spoke with the [facility] triage nurse Haig Prophet about patient feeling poorly with new nausea and intermittent shoulder/back pain for the past 1-2 days and to inquire if patient could be seen in their urgent clinic or while in Short Stay by their covering hospital surgeon. I was told that Dr. Lindie Spruce is out of town until 11/28/12, and that if patient is feeling poorly her only option would be to go to the ED for further evaluation.  [facility] could then be consulted as felt appropriate.  I advised patient about going to the ED where she could at least be further evaluated to see if her drain was functioning appropriately. She declined to go to the ED this afternoon.  She prefers to see how she does tonight.  I did tell her (and her husband) that she should not wait to be further evaluated in the ED if she continues to feel poorly--especially since her surgery is still a week away.  She is currently afebrile with a normal WBC.         Velna Ochs [facility] Stay [facility]/Anesthesiology Phone  [phone] 11/21/2012 5:48 PM

## 2012-11-21 NOTE — Telephone Encounter (Signed)
Revonda Standard PA from pre-op called to report that patient is feeling very bad and doesn't think she can make it until her surgery date on 11/29/12 for her lap chole.  Patient had labs drawn today but they have not resulted at this time.  Updated Revonda Standard PA regarding my conversation with Dr. Luisa Hart about the drainage color change.  Mary Sella PA that Dr. Lindie Spruce is not available until next week so if the patient is feeling that badly she would need to be evaluated in the ED to determine if this surgery needs to be done emergently.  Revonda Standard PA also asking about when patient is suppose to stop her Plavix.  Unable to find documentation of this so explained that a message will be sent to Dr. Lindie Spruce and Marcelino Duster CMA to ask.  Revonda Standard PA states understanding and agreeable at this time.

## 2012-11-22 ENCOUNTER — Other Ambulatory Visit: Payer: Medicare Other | Admitting: Lab

## 2012-11-22 NOTE — Telephone Encounter (Signed)
I tried calling patient to check and see if she was feeling better but there was no answer or VM to leave a message. I also want was going to clarify she needs to stop the plavix 5 days prior to surgery. Will try and call back at later time.

## 2012-11-22 NOTE — Telephone Encounter (Signed)
Received an email this morning back from [place] PA who states that patient did not wish to go to the ED last night.  Patient has polycythemia vera which this RN was unaware of yesterday so her normal HgB is between 5-6 with transfusions.  Patient told Revonda Standard PA that she was going to go home and rest but understood that if she continued to feel badly between now and her surgery date then she would go to the ED.

## 2012-11-22 NOTE — Telephone Encounter (Signed)
Addressed by [facility] staff.

## 2012-11-23 NOTE — Telephone Encounter (Signed)
Just called and spoke to patient's husband who states patient is feeling some what better.  They do believe that patient had an infection however it has improved.  Husband states patient stopped taking her Plavix today.  Husband aware that he can still call the office over the weekend if something happens and he has questions.  Husband states they are really trying to hold out until 11/29/12 since everything will be set up specifically for the patient with her blood, etc.  Husband states understanding and has no questions at this time.

## 2012-11-24 ENCOUNTER — Encounter: Payer: Self-pay | Admitting: Internal Medicine

## 2012-11-24 ENCOUNTER — Ambulatory Visit (INDEPENDENT_AMBULATORY_CARE_PROVIDER_SITE_OTHER): Payer: Medicare Other | Admitting: Internal Medicine

## 2012-11-24 VITALS — BP 118/58 | HR 70 | Temp 97.5°F | Ht 63.0 in | Wt 159.0 lb

## 2012-11-24 DIAGNOSIS — D45 Polycythemia vera: Secondary | ICD-10-CM

## 2012-11-24 DIAGNOSIS — E039 Hypothyroidism, unspecified: Secondary | ICD-10-CM

## 2012-11-24 DIAGNOSIS — K810 Acute cholecystitis: Secondary | ICD-10-CM

## 2012-11-24 DIAGNOSIS — I639 Cerebral infarction, unspecified: Secondary | ICD-10-CM

## 2012-11-24 DIAGNOSIS — M797 Fibromyalgia: Secondary | ICD-10-CM

## 2012-11-24 DIAGNOSIS — IMO0001 Reserved for inherently not codable concepts without codable children: Secondary | ICD-10-CM

## 2012-11-24 DIAGNOSIS — I119 Hypertensive heart disease without heart failure: Secondary | ICD-10-CM

## 2012-11-24 DIAGNOSIS — I6350 Cerebral infarction due to unspecified occlusion or stenosis of unspecified cerebral artery: Secondary | ICD-10-CM

## 2012-11-24 DIAGNOSIS — Z780 Asymptomatic menopausal state: Secondary | ICD-10-CM

## 2012-11-24 NOTE — Progress Notes (Signed)
Subjective:    Patient ID: [name], female    DOB: 09/23/1945, 67 y.o.   MRN: [id number]  HPI  Patient to me, here to establish with primary care Follows with several [facility] providers including Dr. [name] for hematology, Dr. Patty Sermons for cardiology and Dr. Lindie Spruce for general surgery  Reviewed chronic medical issues today Acute cholecystitis 09/2012, treated with Perc chole tube pending surgical clearance for lap cholecystectomy -planned for 11/2012. abdominal pain pain controlled, no fever or nausea and vomiting.  Stroke July 2014. Associated with right hemiparesis and MRI changes. Motor deficits have improved. S/p L CEA for tx of same and on Plavix  Polycythemia vera, hyperviscosity syndrome. Follows with hematology and regular phlebotomy:. goal hemoglobin less than 6.5 per hematologist  Hypertension - no associated heart failure history but takes Lasix daily for associated edema - the patient reports compliance with medication(s) as prescribed. Denies adverse side effects.  Chronic pain syndrome/fibromyalgia - largely related to hyperviscosity syndrome - pain med regimen reviewed - managed by heme ([facility])  Hypothyroidism - takes only Armour as levothyroxine not absorbed/ineffective. the patient reports compliance with medication(s) as prescribed. Denies adverse side effects.  Past Medical History  Diagnosis Date  . 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 Fibromyalgia     chronic pain syndrome  . Postmenopausal state     on hormone replacement therapy  . Aortic valve sclerosis     by echocardiogram 12/04/2009  . Hyperlipidemia   . 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 GERD (gastroesophageal reflux disease)   . Difficult intubation     grade 3 airway  . PAC (premature atrial contraction)   . Hypertension   . Asthma   . Seizures     only seizure was 10/14/12;takes Keppra daily  . Chronic constipation     takes  Mineral Oil,Juice,Enema(prn),and Miralax(Prn) and Cascara nightly  . Insomnia     takes restoril nightly and Xanax  . Hypothyroidism   . Seasonal allergies     takes Allegra in am and Benadryl at night  . Occlusion and stenosis of carotid artery without mention of cerebral infarction 10/26/2012  . 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 Acute cholecystitis 10/07/2012    s/p pec drain due to recent CVA, pending chole 11/2012   Family History  Problem Relation Age of Onset  . Heart attack Father   . Coronary artery disease Mother     had aortic valve replacement   History  Substance Use Topics  . Smoking status: Never Smoker   . Smokeless tobacco: Never Used  . Alcohol Use: No     Review of Systems Constitutional: Negative for fever or weight change.  Respiratory: Negative for cough and shortness of breath.   Cardiovascular: Negative for chest pain or palpitations.  Gastrointestinal: Negative for bowel changes.  Musculoskeletal: Negative for gait problem or joint swelling.  Skin: Negative for rash.  Neurological: Negative for dizziness or headache.  No other specific complaints in a complete review of systems (except as listed in HPI above).     Objective:   Physical Exam BP 118/58  Pulse 70  Temp(Src) 97.5 F (36.4 C) (Oral)  Ht 5\' 3"  (1.6 m)  Wt 159 lb (72.122 kg)  BMI 28.17 kg/m2  SpO2 93% Wt Readings from Last 3 Encounters:  11/24/12 159 lb (72.122 kg)  11/09/12 151 lb 12.8 oz (68.856 kg)  11/01/12 159 lb 6.4  oz (72.303 kg)   Constitutional: She appears pale, chronically ill , but well-nourished and no acute distress.  HENT: Head: Normocephalic and atraumatic. Ears: B TMs ok, no erythema or effusion; Nose: Nose normal. Mouth/Throat: Oropharynx is clear and moist. No oropharyngeal exudate.  Eyes: Conjunctivae and EOM are normal. Pupils are equal, round, and reactive to light. No scleral icterus.  Neck: Normal range of motion. Neck supple. No JVD present. No  thyromegaly present.  Cardiovascular: Normal rate, regular rhythm and normal heart sounds.  No murmur heard. No BLE edema. Pulmonary/Chest: Effort normal and breath sounds normal. No respiratory distress. She has no wheezes.  Abdominal: Soft. Bowel sounds are normal. She exhibits no distension. There is no tenderness. no masses. Perc chole drain intact, dressing c/d/i Musculoskeletal: Normal range of motion, no joint effusions. No gross deformities Neurological: She is alert and oriented to person, place, and time. No cranial nerve deficit. Coordination and speech are normal. Balance, strength and gait are not tested today.  Skin: Skin is pale but warm and dry. No rash noted. No erythema.  Psychiatric: She has a normal mood and affect. Her behavior is normal. Judgment and thought content normal.   Lab Results  Component Value Date   WBC 5.1 11/21/2012   HGB 5.6* 11/21/2012   HCT 21.7* 11/21/2012   PLT 229 11/21/2012   GLUCOSE 132* 11/21/2012   CHOL 58 10/07/2012   TRIG 121 10/07/2012   HDL 11* 10/07/2012   LDLCALC 23 10/07/2012   ALT 24 11/21/2012   AST 33 11/21/2012   NA 136 11/21/2012   K 4.1 11/21/2012   CL 103 11/21/2012   CREATININE 0.53 11/21/2012   BUN 5* 11/21/2012   CO2 26 11/21/2012   TSH 0.051* 10/04/2012   INR 1.15 11/21/2012   HGBA1C 5.5 10/07/2012       Assessment & Plan:   See problem list. Medications and labs reviewed today.  Time spent with pt/family today 60 minutes, greater than 50% time spent counseling patient on acute chole complications, hyperviscosity syndrome with PCV, chronic pain syndrome related to same +FM, hypertension and medication review. Also review of prior records

## 2012-11-24 NOTE — Patient Instructions (Signed)
It was good to see you today. We have reviewed your prior records including labs and tests today Medications reviewed and updated, no changes recommended at this time. Good luck with your surgery! Please schedule followup in 6-12 months, call sooner if problems.

## 2012-11-26 NOTE — Assessment & Plan Note (Signed)
Treated with Armour replacement only, intol of prior trials levothyroxine by report, ?due to malabsorption  Continue same, follows TSH q26mo and as needed Lab Results  Component Value Date   TSH 0.051* 10/04/2012

## 2012-11-26 NOTE — Assessment & Plan Note (Signed)
Follows with specialist Dr Thompson Grayer in [place] for mgmt of hormone replacement to manage symptoms -fluoxymesterone and cong estrogens. Pt/spouse committed to continue care for this condition with this specialist provider and I encouraged same

## 2012-11-26 NOTE — Assessment & Plan Note (Signed)
associated with hyperviscosity syndrome Follows with heme closely for same Tx is frequent phlebotomy to keep Hgb goal <6.5

## 2012-11-26 NOTE — Assessment & Plan Note (Signed)
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 Pain med regimen managed by heme ([facility]) because of same - controlled subst contract with [facility] on file Reviewed meds, but no changes recommended - Pain control today at baseline

## 2012-11-26 NOTE — Assessment & Plan Note (Signed)
Symptomatic dz, hosp for same 09/2012 Because of recent CVA (09/2012) just prior to onset of dz, pt treated with perc chole drain, antibiotics and stabilization pending clearance by neuro, cards and heme. No fever or complications, planning lap chole 11/2012 Interval hx reviewed - no changes recommended

## 2012-11-26 NOTE — Assessment & Plan Note (Signed)
BP Readings from Last 3 Encounters:  11/24/12 118/58  11/16/12 96/62  11/09/12 130/84   The current medical regimen is effective;  continue present plan and medications.

## 2012-11-26 NOTE — Assessment & Plan Note (Signed)
CVA with R HP 09/2012 associated with L ICS, s/p L CEA 09/2012 Also complicated by postop sz event - remains on Keppra AED for control of same Motor deficits have largely improved, reports cognitive deficits at baseline (mild) Continue medical mgmt with Plavix intol of statin tx

## 2012-11-28 MED ORDER — CIPROFLOXACIN IN D5W 400 MG/200ML IV SOLN
400.0000 mg | INTRAVENOUS | Status: AC
  Administered 2012-11-29: 400 mg via INTRAVENOUS
  Filled 2012-11-28: qty 200

## 2012-11-29 ENCOUNTER — Encounter (HOSPITAL_COMMUNITY)
Admission: RE | Disposition: A | Discharge status: Home / Self Care | Payer: Self-pay | Source: Ambulatory Visit | Attending: General Surgery

## 2012-11-29 ENCOUNTER — Encounter (HOSPITAL_COMMUNITY): Payer: Self-pay | Admitting: Vascular Surgery

## 2012-11-29 ENCOUNTER — Inpatient Hospital Stay (HOSPITAL_COMMUNITY): Payer: Medicare Other

## 2012-11-29 ENCOUNTER — Inpatient Hospital Stay (HOSPITAL_COMMUNITY): Payer: Medicare Other | Admitting: Anesthesiology

## 2012-11-29 ENCOUNTER — Inpatient Hospital Stay (HOSPITAL_COMMUNITY)
Admission: RE | Admit: 2012-11-29 | Discharge: 2012-12-07 | DRG: 405 | Disposition: A | Discharge status: Home / Self Care | Payer: Medicare Other | Source: Ambulatory Visit | Attending: General Surgery | Admitting: General Surgery

## 2012-11-29 ENCOUNTER — Encounter (HOSPITAL_COMMUNITY): Payer: Self-pay | Admitting: Anesthesiology

## 2012-11-29 DIAGNOSIS — E039 Hypothyroidism, unspecified: Secondary | ICD-10-CM | POA: Diagnosis present

## 2012-11-29 DIAGNOSIS — Z7982 Long term (current) use of aspirin: Secondary | ICD-10-CM

## 2012-11-29 DIAGNOSIS — Z79899 Other long term (current) drug therapy: Secondary | ICD-10-CM

## 2012-11-29 DIAGNOSIS — I119 Hypertensive heart disease without heart failure: Secondary | ICD-10-CM

## 2012-11-29 DIAGNOSIS — D72829 Elevated white blood cell count, unspecified: Secondary | ICD-10-CM | POA: Diagnosis not present

## 2012-11-29 DIAGNOSIS — I359 Nonrheumatic aortic valve disorder, unspecified: Secondary | ICD-10-CM | POA: Diagnosis present

## 2012-11-29 DIAGNOSIS — F99 Mental disorder, not otherwise specified: Secondary | ICD-10-CM | POA: Diagnosis not present

## 2012-11-29 DIAGNOSIS — I491 Atrial premature depolarization: Secondary | ICD-10-CM | POA: Diagnosis not present

## 2012-11-29 DIAGNOSIS — K810 Acute cholecystitis: Secondary | ICD-10-CM

## 2012-11-29 DIAGNOSIS — I498 Other specified cardiac arrhythmias: Secondary | ICD-10-CM | POA: Diagnosis not present

## 2012-11-29 DIAGNOSIS — K831 Obstruction of bile duct: Secondary | ICD-10-CM | POA: Diagnosis present

## 2012-11-29 DIAGNOSIS — K219 Gastro-esophageal reflux disease without esophagitis: Secondary | ICD-10-CM | POA: Diagnosis present

## 2012-11-29 DIAGNOSIS — G40209 Localization-related (focal) (partial) symptomatic epilepsy and epileptic syndromes with complex partial seizures, not intractable, without status epilepticus: Secondary | ICD-10-CM | POA: Diagnosis present

## 2012-11-29 DIAGNOSIS — D751 Secondary polycythemia: Secondary | ICD-10-CM | POA: Diagnosis present

## 2012-11-29 DIAGNOSIS — I6529 Occlusion and stenosis of unspecified carotid artery: Secondary | ICD-10-CM | POA: Diagnosis present

## 2012-11-29 DIAGNOSIS — K8066 Calculus of gallbladder and bile duct with acute and chronic cholecystitis without obstruction: Secondary | ICD-10-CM

## 2012-11-29 DIAGNOSIS — I639 Cerebral infarction, unspecified: Secondary | ICD-10-CM

## 2012-11-29 DIAGNOSIS — K8020 Calculus of gallbladder without cholecystitis without obstruction: Secondary | ICD-10-CM

## 2012-11-29 DIAGNOSIS — D45 Polycythemia vera: Secondary | ICD-10-CM

## 2012-11-29 DIAGNOSIS — K8019 Calculus of gallbladder with other cholecystitis with obstruction: Principal | ICD-10-CM | POA: Diagnosis present

## 2012-11-29 DIAGNOSIS — Z5331 Laparoscopic surgical procedure converted to open procedure: Secondary | ICD-10-CM

## 2012-11-29 DIAGNOSIS — T40605A Adverse effect of unspecified narcotics, initial encounter: Secondary | ICD-10-CM | POA: Diagnosis not present

## 2012-11-29 DIAGNOSIS — Z8673 Personal history of transient ischemic attack (TIA), and cerebral infarction without residual deficits: Secondary | ICD-10-CM

## 2012-11-29 DIAGNOSIS — I739 Peripheral vascular disease, unspecified: Secondary | ICD-10-CM | POA: Diagnosis present

## 2012-11-29 DIAGNOSIS — J40 Bronchitis, not specified as acute or chronic: Secondary | ICD-10-CM

## 2012-11-29 HISTORY — PX: CHOLECYSTECTOMY: SHX55

## 2012-11-29 LAB — POCT I-STAT 4, (NA,K, GLUC, HGB,HCT)
Glucose, Bld: 161 mg/dL — ABNORMAL HIGH (ref 70–99)
HCT: 18 % — ABNORMAL LOW (ref 36.0–46.0)
Hemoglobin: 6.1 g/dL — CL (ref 12.0–15.0)
Sodium: 135 mEq/L (ref 135–145)

## 2012-11-29 LAB — CBC WITH DIFFERENTIAL/PLATELET
Eosinophils Relative: 1 % (ref 0–5)
Lymphs Abs: 0.7 10*3/uL (ref 0.7–4.0)
MCV: 60.9 fL — ABNORMAL LOW (ref 78.0–100.0)
Monocytes Relative: 7 % (ref 3–12)
Neutrophils Relative %: 82 % — ABNORMAL HIGH (ref 43–77)
Platelets: 221 10*3/uL (ref 150–400)
RBC: 3.2 MIL/uL — ABNORMAL LOW (ref 3.87–5.11)
WBC: 7 10*3/uL (ref 4.0–10.5)

## 2012-11-29 LAB — HEMOGLOBIN AND HEMATOCRIT, BLOOD: HCT: 23.1 % — ABNORMAL LOW (ref 36.0–46.0)

## 2012-11-29 LAB — GLUCOSE, CAPILLARY: Glucose-Capillary: 145 mg/dL — ABNORMAL HIGH (ref 70–99)

## 2012-11-29 SURGERY — LAPAROSCOPIC CHOLECYSTECTOMY WITH INTRAOPERATIVE CHOLANGIOGRAM
Anesthesia: General | Site: Abdomen | Wound class: Dirty or Infected

## 2012-11-29 SURGERY — LAPAROSCOPIC CHOLECYSTECTOMY WITH INTRAOPERATIVE CHOLANGIOGRAM
Anesthesia: General

## 2012-11-29 MED ORDER — LIDOCAINE HCL (CARDIAC) 20 MG/ML IV SOLN
INTRAVENOUS | Status: DC | PRN
  Administered 2012-11-29: 60 mg via INTRAVENOUS

## 2012-11-29 MED ORDER — SODIUM CHLORIDE 0.9 % IJ SOLN: 9.0000 mL | INTRAMUSCULAR | Status: DC | PRN

## 2012-11-29 MED ORDER — ALPRAZOLAM 0.5 MG PO TABS
0.5000 mg | ORAL_TABLET | Freq: Every day | ORAL | Status: DC
  Administered 2012-11-29 – 2012-12-05 (×3): 0.5 mg via ORAL
  Filled 2012-11-29 (×4): qty 1

## 2012-11-29 MED ORDER — METOCLOPRAMIDE HCL 5 MG/ML IJ SOLN
10.0000 mg | Freq: Once | INTRAMUSCULAR | Status: AC | PRN
  Administered 2012-11-29: 10 mg via INTRAVENOUS

## 2012-11-29 MED ORDER — OXYCODONE HCL 10 MG PO TB12: 10.0000 mg | ORAL_TABLET | Freq: Two times a day (BID) | ORAL | Status: DC | PRN

## 2012-11-29 MED ORDER — PROMETHAZINE HCL 25 MG/ML IJ SOLN
12.5000 mg | Freq: Three times a day (TID) | INTRAMUSCULAR | Status: DC | PRN
  Administered 2012-11-29 – 2012-11-30 (×4): 12.5 mg via INTRAVENOUS
  Filled 2012-11-29 (×4): qty 1

## 2012-11-29 MED ORDER — SODIUM CHLORIDE 0.9 % IJ SOLN
INTRAMUSCULAR | Status: AC
  Filled 2012-11-29: qty 9

## 2012-11-29 MED ORDER — MORPHINE SULFATE (PF) 1 MG/ML IV SOLN
INTRAVENOUS | Status: DC
  Administered 2012-11-29: 13:00:00 via INTRAVENOUS
  Administered 2012-11-29: 3 mg via INTRAVENOUS
  Administered 2012-11-29: 16.5 mg via INTRAVENOUS
  Administered 2012-11-30: 0.1 mg via INTRAVENOUS

## 2012-11-29 MED ORDER — PROMETHAZINE HCL 25 MG/ML IJ SOLN
INTRAMUSCULAR | Status: AC
  Filled 2012-11-29: qty 1

## 2012-11-29 MED ORDER — OXYCODONE HCL ER 10 MG PO T12A
10.0000 mg | EXTENDED_RELEASE_TABLET | Freq: Two times a day (BID) | ORAL | Status: DC | PRN
  Filled 2012-11-29: qty 1

## 2012-11-29 MED ORDER — HYDROMORPHONE HCL PF 1 MG/ML IJ SOLN
0.2500 mg | INTRAMUSCULAR | Status: DC | PRN
  Administered 2012-11-29 (×4): 0.5 mg via INTRAVENOUS

## 2012-11-29 MED ORDER — PHENYLEPHRINE HCL 10 MG/ML IJ SOLN
INTRAMUSCULAR | Status: DC | PRN
  Administered 2012-11-29 (×6): 80 ug via INTRAVENOUS

## 2012-11-29 MED ORDER — NALOXONE HCL 0.4 MG/ML IJ SOLN
0.4000 mg | INTRAMUSCULAR | Status: DC | PRN
  Filled 2012-11-29: qty 1

## 2012-11-29 MED ORDER — PROMETHAZINE HCL 25 MG RE SUPP: 25.0000 mg | Freq: Four times a day (QID) | RECTAL | Status: DC | PRN

## 2012-11-29 MED ORDER — ONDANSETRON HCL 4 MG/2ML IJ SOLN
4.0000 mg | Freq: Four times a day (QID) | INTRAMUSCULAR | Status: DC | PRN
  Administered 2012-11-29 – 2012-12-03 (×8): 4 mg via INTRAVENOUS
  Filled 2012-11-29 (×9): qty 2

## 2012-11-29 MED ORDER — LACTATED RINGERS IV SOLN
INTRAVENOUS | Status: DC
  Administered 2012-11-29: 08:00:00 via INTRAVENOUS

## 2012-11-29 MED ORDER — CYCLOBENZAPRINE HCL 10 MG PO TABS
10.0000 mg | ORAL_TABLET | Freq: Every day | ORAL | Status: DC
  Administered 2012-11-29 – 2012-12-05 (×3): 10 mg via ORAL
  Filled 2012-11-29 (×11): qty 1

## 2012-11-29 MED ORDER — DIPHENHYDRAMINE HCL 25 MG PO TABS
50.0000 mg | ORAL_TABLET | Freq: Three times a day (TID) | ORAL | Status: DC | PRN
Start: 2012-11-29 — End: 2012-12-07
  Administered 2012-12-04 – 2012-12-06 (×3): 50 mg via ORAL
  Filled 2012-11-29 (×4): qty 2

## 2012-11-29 MED ORDER — LEVETIRACETAM 500 MG PO TABS
500.0000 mg | ORAL_TABLET | Freq: Two times a day (BID) | ORAL | Status: DC
  Administered 2012-11-29: 500 mg via ORAL
  Filled 2012-11-29 (×4): qty 1

## 2012-11-29 MED ORDER — TEMAZEPAM 15 MG PO CAPS: 30.0000 mg | ORAL_CAPSULE | Freq: Every day | ORAL | Status: DC

## 2012-11-29 MED ORDER — ONDANSETRON 4 MG PO TBDP
4.0000 mg | ORAL_TABLET | Freq: Three times a day (TID) | ORAL | Status: DC | PRN
  Filled 2012-11-29: qty 1

## 2012-11-29 MED ORDER — DIPHENHYDRAMINE HCL 50 MG/ML IJ SOLN
12.5000 mg | Freq: Four times a day (QID) | INTRAMUSCULAR | Status: DC | PRN
  Administered 2012-11-29 – 2012-12-02 (×7): 12.5 mg via INTRAVENOUS
  Filled 2012-11-29 (×5): qty 1
  Filled 2012-11-29: qty 0.25
  Filled 2012-11-29 (×3): qty 1

## 2012-11-29 MED ORDER — DEXAMETHASONE SODIUM PHOSPHATE 4 MG/ML IJ SOLN
INTRAMUSCULAR | Status: DC | PRN
  Administered 2012-11-29: 8 mg via INTRAVENOUS

## 2012-11-29 MED ORDER — NITROGLYCERIN 0.4 MG SL SUBL: 0.4000 mg | SUBLINGUAL_TABLET | SUBLINGUAL | Status: DC | PRN

## 2012-11-29 MED ORDER — FLUOXYMESTERONE 10 MG PO TABS: 2.5000 mg | ORAL_TABLET | Freq: Every day | ORAL | Status: DC | PRN

## 2012-11-29 MED ORDER — LISINOPRIL 10 MG PO TABS
10.0000 mg | ORAL_TABLET | Freq: Two times a day (BID) | ORAL | Status: DC | PRN
Start: 2012-11-29 — End: 2012-12-07
  Filled 2012-11-29: qty 1

## 2012-11-29 MED ORDER — HYDROMORPHONE HCL PF 1 MG/ML IJ SOLN
INTRAMUSCULAR | Status: AC
  Filled 2012-11-29: qty 1

## 2012-11-29 MED ORDER — ONDANSETRON HCL 4 MG/2ML IJ SOLN
INTRAMUSCULAR | Status: AC
  Filled 2012-11-29: qty 2

## 2012-11-29 MED ORDER — IPRATROPIUM-ALBUTEROL 18-103 MCG/ACT IN AERO: 2.0000 | INHALATION_SPRAY | Freq: Four times a day (QID) | RESPIRATORY_TRACT | Status: DC | PRN

## 2012-11-29 MED ORDER — CYANOCOBALAMIN 1000 MCG/ML IJ SOLN
1000.0000 ug | INTRAMUSCULAR | Status: DC | PRN
  Filled 2012-11-29: qty 1

## 2012-11-29 MED ORDER — METOCLOPRAMIDE HCL 5 MG/ML IJ SOLN
INTRAMUSCULAR | Status: AC
  Filled 2012-11-29: qty 2

## 2012-11-29 MED ORDER — POVIDONE-IODINE 10 % EX OINT
TOPICAL_OINTMENT | CUTANEOUS | Status: DC | PRN
  Administered 2012-11-29: 1 via TOPICAL

## 2012-11-29 MED ORDER — CASCARA SAGRADA 450 MG PO CAPS: 900.0000 mg | ORAL_CAPSULE | Freq: Every day | ORAL | Status: DC

## 2012-11-29 MED ORDER — SODIUM CHLORIDE 0.9 % IV SOLN
INTRAVENOUS | Status: DC | PRN
  Administered 2012-11-29: 11:00:00

## 2012-11-29 MED ORDER — IPRATROPIUM-ALBUTEROL 20-100 MCG/ACT IN AERS
2.0000 | INHALATION_SPRAY | Freq: Four times a day (QID) | RESPIRATORY_TRACT | Status: DC | PRN
  Administered 2012-11-30: 2 via RESPIRATORY_TRACT
  Filled 2012-11-29: qty 4

## 2012-11-29 MED ORDER — FENTANYL CITRATE 0.05 MG/ML IJ SOLN
INTRAMUSCULAR | Status: DC | PRN
  Administered 2012-11-29 (×3): 50 ug via INTRAVENOUS
  Administered 2012-11-29: 100 ug via INTRAVENOUS

## 2012-11-29 MED ORDER — ESTROGENS CONJUGATED 0.625 MG PO TABS
0.6250 mg | ORAL_TABLET | Freq: Every day | ORAL | Status: DC
  Administered 2012-12-04 – 2012-12-07 (×4): 0.625 mg via ORAL
  Filled 2012-11-29 (×9): qty 1

## 2012-11-29 MED ORDER — LIDOCAINE-PRILOCAINE 2.5-2.5 % EX CREA
1.0000 "application " | TOPICAL_CREAM | Freq: Every day | CUTANEOUS | Status: DC | PRN
  Filled 2012-11-29: qty 5

## 2012-11-29 MED ORDER — PROMETHAZINE HCL 25 MG/ML IJ SOLN
12.5000 mg | Freq: Once | INTRAMUSCULAR | Status: AC
  Administered 2012-11-29: 12.5 mg via INTRAVENOUS

## 2012-11-29 MED ORDER — BUPIVACAINE-EPINEPHRINE PF 0.25-1:200000 % IJ SOLN
INTRAMUSCULAR | Status: AC
  Filled 2012-11-29: qty 30

## 2012-11-29 MED ORDER — CYCLOBENZAPRINE HCL 10 MG PO TABS: 10.0000 mg | ORAL_TABLET | ORAL | Status: DC

## 2012-11-29 MED ORDER — PROPOFOL 10 MG/ML IV BOLUS
INTRAVENOUS | Status: DC | PRN
  Administered 2012-11-29: 50 mg via INTRAVENOUS

## 2012-11-29 MED ORDER — KCL IN DEXTROSE-NACL 20-5-0.45 MEQ/L-%-% IV SOLN
INTRAVENOUS | Status: AC
  Filled 2012-11-29: qty 1000

## 2012-11-29 MED ORDER — ZOLPIDEM TARTRATE 5 MG PO TABS
5.0000 mg | ORAL_TABLET | Freq: Every day | ORAL | Status: DC
  Administered 2012-11-29: 5 mg via ORAL
  Filled 2012-11-29 (×3): qty 1

## 2012-11-29 MED ORDER — ARTIFICIAL TEARS OP OINT
TOPICAL_OINTMENT | OPHTHALMIC | Status: DC | PRN
  Administered 2012-11-29: 1 via OPHTHALMIC

## 2012-11-29 MED ORDER — BISOPROLOL FUMARATE 10 MG PO TABS
10.0000 mg | ORAL_TABLET | Freq: Every day | ORAL | Status: DC
  Administered 2012-12-04 – 2012-12-07 (×4): 10 mg via ORAL
  Filled 2012-11-29 (×9): qty 1

## 2012-11-29 MED ORDER — OXYCODONE HCL ER 10 MG PO T12A: 10.0000 mg | EXTENDED_RELEASE_TABLET | Freq: Two times a day (BID) | ORAL | Status: DC | PRN

## 2012-11-29 MED ORDER — OXYCODONE HCL 5 MG/5ML PO SOLN: 5.0000 mg | Freq: Once | ORAL | Status: DC | PRN

## 2012-11-29 MED ORDER — SENNA 8.6 MG PO TABS
2.0000 | ORAL_TABLET | Freq: Every evening | ORAL | Status: DC | PRN
  Administered 2012-12-04 – 2012-12-05 (×2): 17.2 mg via ORAL
  Filled 2012-11-29 (×3): qty 2

## 2012-11-29 MED ORDER — SODIUM CHLORIDE 0.9 % IV SOLN
INTRAVENOUS | Status: DC | PRN
  Administered 2012-11-29: 10:00:00 via INTRAVENOUS

## 2012-11-29 MED ORDER — THYROID 30 MG PO TABS
150.0000 mg | ORAL_TABLET | Freq: Every day | ORAL | Status: DC
  Administered 2012-12-04 – 2012-12-07 (×4): 150 mg via ORAL
  Filled 2012-11-29 (×9): qty 1

## 2012-11-29 MED ORDER — MINERAL OIL PO OIL
30.0000 mL | TOPICAL_OIL | Freq: Every day | ORAL | Status: DC
  Administered 2012-12-04 – 2012-12-06 (×3): 30 mL via ORAL
  Filled 2012-11-29 (×10): qty 30

## 2012-11-29 MED ORDER — CIPROFLOXACIN IN D5W 400 MG/200ML IV SOLN
400.0000 mg | Freq: Two times a day (BID) | INTRAVENOUS | Status: DC
  Administered 2012-11-29 – 2012-12-04 (×10): 400 mg via INTRAVENOUS
  Filled 2012-11-29 (×12): qty 200

## 2012-11-29 MED ORDER — MORPHINE SULFATE (PF) 1 MG/ML IV SOLN
INTRAVENOUS | Status: AC
  Filled 2012-11-29: qty 25

## 2012-11-29 MED ORDER — KCL IN DEXTROSE-NACL 20-5-0.45 MEQ/L-%-% IV SOLN
INTRAVENOUS | Status: DC
  Administered 2012-11-29 – 2012-12-02 (×5): via INTRAVENOUS
  Filled 2012-11-29 (×14): qty 1000

## 2012-11-29 MED ORDER — ONDANSETRON HCL 4 MG/2ML IJ SOLN
INTRAMUSCULAR | Status: DC | PRN
  Administered 2012-11-29: 4 mg via INTRAVENOUS

## 2012-11-29 MED ORDER — MIDAZOLAM HCL 5 MG/5ML IJ SOLN
INTRAMUSCULAR | Status: DC | PRN
  Administered 2012-11-29: 1 mg via INTRAVENOUS

## 2012-11-29 MED ORDER — 0.9 % SODIUM CHLORIDE (POUR BTL) OPTIME
TOPICAL | Status: DC | PRN
  Administered 2012-11-29 (×2): 1000 mL

## 2012-11-29 MED ORDER — TRIAMCINOLONE ACETONIDE 0.1 % EX CREA
1.0000 "application " | TOPICAL_CREAM | Freq: Two times a day (BID) | CUTANEOUS | Status: DC | PRN
  Filled 2012-11-29: qty 15

## 2012-11-29 MED ORDER — DIPHENHYDRAMINE HCL 12.5 MG/5ML PO ELIX
12.5000 mg | ORAL_SOLUTION | Freq: Four times a day (QID) | ORAL | Status: DC | PRN
  Filled 2012-11-29: qty 5

## 2012-11-29 MED ORDER — POTASSIUM CHLORIDE CRYS ER 10 MEQ PO TBCR
10.0000 meq | EXTENDED_RELEASE_TABLET | Freq: Every day | ORAL | Status: DC
  Administered 2012-11-29: 10 meq via ORAL
  Filled 2012-11-29 (×6): qty 1

## 2012-11-29 MED ORDER — NEOSTIGMINE METHYLSULFATE 1 MG/ML IJ SOLN
INTRAMUSCULAR | Status: DC | PRN
  Administered 2012-11-29: 3 mg via INTRAVENOUS

## 2012-11-29 MED ORDER — OXYCODONE HCL 5 MG PO TABS: 5.0000 mg | ORAL_TABLET | Freq: Once | ORAL | Status: DC | PRN

## 2012-11-29 MED ORDER — LACTATED RINGERS IV SOLN
INTRAVENOUS | Status: DC | PRN
  Administered 2012-11-29 (×4): via INTRAVENOUS

## 2012-11-29 MED ORDER — DOCUSATE SODIUM 100 MG PO CAPS
100.0000 mg | ORAL_CAPSULE | Freq: Three times a day (TID) | ORAL | Status: DC
  Administered 2012-12-04 – 2012-12-07 (×8): 100 mg via ORAL
  Filled 2012-11-29 (×9): qty 1

## 2012-11-29 MED ORDER — OXYCODONE HCL 5 MG PO TABS
7.5000 mg | ORAL_TABLET | Freq: Three times a day (TID) | ORAL | Status: DC | PRN
  Administered 2012-11-29: 7.5 mg via ORAL
  Administered 2012-11-29 – 2012-12-03 (×5): 10 mg via ORAL
  Administered 2012-12-04: 15 mg via ORAL
  Filled 2012-11-29 (×6): qty 2
  Filled 2012-11-29: qty 3
  Filled 2012-11-29: qty 2

## 2012-11-29 MED ORDER — ROCURONIUM BROMIDE 100 MG/10ML IV SOLN
INTRAVENOUS | Status: DC | PRN
  Administered 2012-11-29 (×7): 5 mg via INTRAVENOUS
  Administered 2012-11-29: 40 mg via INTRAVENOUS

## 2012-11-29 MED ORDER — VITAMIN D3 25 MCG (1000 UNIT) PO TABS
3000.0000 [IU] | ORAL_TABLET | Freq: Every day | ORAL | Status: DC
  Administered 2012-12-04 – 2012-12-07 (×4): 3000 [IU] via ORAL
  Filled 2012-11-29 (×9): qty 3

## 2012-11-29 MED ORDER — OXYCODONE HCL ER 10 MG PO T12A
20.0000 mg | EXTENDED_RELEASE_TABLET | ORAL | Status: DC
  Administered 2012-11-30 – 2012-12-06 (×10): 20 mg via ORAL
  Administered 2012-12-06 – 2012-12-07 (×2): 10 mg via ORAL
  Filled 2012-11-29 (×7): qty 2
  Filled 2012-11-29: qty 1
  Filled 2012-11-29 (×8): qty 2

## 2012-11-29 MED ORDER — FUROSEMIDE 80 MG PO TABS
80.0000 mg | ORAL_TABLET | Freq: Every day | ORAL | Status: DC
  Administered 2012-12-04: 40 mg via ORAL
  Filled 2012-11-29 (×6): qty 1

## 2012-11-29 MED ORDER — SODIUM CHLORIDE 0.9 % IR SOLN
Status: DC | PRN
  Administered 2012-11-29: 1

## 2012-11-29 MED ORDER — VALERIAN 100 MG PO CAPS: 100.0000 mg | ORAL_CAPSULE | Freq: Every evening | ORAL | Status: DC | PRN

## 2012-11-29 MED ORDER — EPHEDRINE SULFATE 50 MG/ML IJ SOLN
INTRAMUSCULAR | Status: DC | PRN
  Administered 2012-11-29 (×3): 10 mg via INTRAVENOUS

## 2012-11-29 MED ORDER — GLYCOPYRROLATE 0.2 MG/ML IJ SOLN
INTRAMUSCULAR | Status: DC | PRN
  Administered 2012-11-29: 0.4 mg via INTRAVENOUS

## 2012-11-29 MED ORDER — CYCLOBENZAPRINE HCL 10 MG PO TABS: 10.0000 mg | ORAL_TABLET | Freq: Two times a day (BID) | ORAL | Status: DC | PRN

## 2012-11-29 MED ORDER — FAMOTIDINE 20 MG PO TABS
20.0000 mg | ORAL_TABLET | Freq: Two times a day (BID) | ORAL | Status: DC
  Administered 2012-12-04 – 2012-12-07 (×8): 20 mg via ORAL
  Filled 2012-11-29 (×18): qty 1

## 2012-11-29 MED ORDER — HYDROCHLOROTHIAZIDE 25 MG PO TABS
12.5000 mg | ORAL_TABLET | Freq: Every day | ORAL | Status: DC | PRN
  Filled 2012-11-29: qty 0.5

## 2012-11-29 MED ORDER — POVIDONE-IODINE 10 % EX OINT
TOPICAL_OINTMENT | CUTANEOUS | Status: AC
  Filled 2012-11-29: qty 28.35

## 2012-11-29 SURGICAL SUPPLY — 77 items
ADH SKN CLS APL DERMABOND .7 (GAUZE/BANDAGES/DRESSINGS) ×1
APPLIER CLIP 5 13 M/L LIGAMAX5 (MISCELLANEOUS) ×2
APPLIER CLIP ROT 10 11.4 M/L (STAPLE)
APR CLP MED LRG 11.4X10 (STAPLE)
APR CLP MED LRG 5 ANG JAW (MISCELLANEOUS) ×1
BAG BILE T-TUBES STRL (MISCELLANEOUS) ×1 IMPLANT
BAG DRN 9.5 2 ADJ BELT ADPR (MISCELLANEOUS) ×1
BAG SPEC RTRVL LRG 6X4 10 (ENDOMECHANICALS)
BLADE SURG ROTATE 9660 (MISCELLANEOUS) IMPLANT
CANISTER SUCTION 2500CC (MISCELLANEOUS) ×2 IMPLANT
CATH EMB 4FR 40CM (CATHETERS) ×2 IMPLANT
CATH ROBINSON RED A/P 12FR (CATHETERS) ×1 IMPLANT
CATH T TUBE WHEL MOSS 12FR (CATHETERS) ×1 IMPLANT
CHLORAPREP W/TINT 26ML (MISCELLANEOUS) ×2 IMPLANT
CLIP APPLIE 5 13 M/L LIGAMAX5 (MISCELLANEOUS) ×1 IMPLANT
CLIP APPLIE ROT 10 11.4 M/L (STAPLE) IMPLANT
CLOTH BEACON ORANGE TIMEOUT ST (SAFETY) ×2 IMPLANT
COVER MAYO STAND STRL (DRAPES) ×2 IMPLANT
COVER SURGICAL LIGHT HANDLE (MISCELLANEOUS) ×2 IMPLANT
DECANTER SPIKE VIAL GLASS SM (MISCELLANEOUS) ×4 IMPLANT
DERMABOND ADVANCED (GAUZE/BANDAGES/DRESSINGS) ×1
DERMABOND ADVANCED .7 DNX12 (GAUZE/BANDAGES/DRESSINGS) ×1 IMPLANT
DRAIN CHANNEL 19F RND (DRAIN) ×1 IMPLANT
DRAPE C-ARM 42X72 X-RAY (DRAPES) ×2 IMPLANT
DRAPE PROXIMA HALF (DRAPES) ×1 IMPLANT
DRAPE UTILITY 15X26 W/TAPE STR (DRAPE) ×4 IMPLANT
ELECT BLADE 6.5 EXT (BLADE) ×1 IMPLANT
ELECT CAUTERY BLADE 6.4 (BLADE) ×1 IMPLANT
ELECT REM PT RETURN 9FT ADLT (ELECTROSURGICAL) ×2
ELECTRODE REM PT RTRN 9FT ADLT (ELECTROSURGICAL) ×1 IMPLANT
EVACUATOR SILICONE 100CC (DRAIN) ×1 IMPLANT
GLOVE BIO SURGEON STRL SZ7 (GLOVE) ×1 IMPLANT
GLOVE BIOGEL PI IND STRL 7.0 (GLOVE) IMPLANT
GLOVE BIOGEL PI IND STRL 7.5 (GLOVE) IMPLANT
GLOVE BIOGEL PI IND STRL 8 (GLOVE) ×1 IMPLANT
GLOVE BIOGEL PI INDICATOR 7.0 (GLOVE) ×1
GLOVE BIOGEL PI INDICATOR 7.5 (GLOVE) ×1
GLOVE BIOGEL PI INDICATOR 8 (GLOVE) ×2
GLOVE ECLIPSE 7.5 STRL STRAW (GLOVE) ×2 IMPLANT
GLOVE SS BIOGEL STRL SZ 6.5 (GLOVE) IMPLANT
GLOVE SS BIOGEL STRL SZ 7.5 (GLOVE) IMPLANT
GLOVE SUPERSENSE BIOGEL SZ 6.5 (GLOVE) ×1
GLOVE SUPERSENSE BIOGEL SZ 7.5 (GLOVE) ×1
GLOVE SURG SS PI 7.0 STRL IVOR (GLOVE) ×1 IMPLANT
GOWN STRL NON-REIN LRG LVL3 (GOWN DISPOSABLE) ×4 IMPLANT
KIT BASIN OR (CUSTOM PROCEDURE TRAY) ×2 IMPLANT
KIT ROOM TURNOVER OR (KITS) ×2 IMPLANT
NEEDLE 22X1 1/2 (OR ONLY) (NEEDLE) ×1 IMPLANT
NS IRRIG 1000ML POUR BTL (IV SOLUTION) ×2 IMPLANT
PAD ARMBOARD 7.5X6 YLW CONV (MISCELLANEOUS) ×4 IMPLANT
PENCIL BUTTON HOLSTER BLD 10FT (ELECTRODE) ×1 IMPLANT
POUCH SPECIMEN RETRIEVAL 10MM (ENDOMECHANICALS) IMPLANT
SCISSORS LAP 5X35 DISP (ENDOMECHANICALS) IMPLANT
SET CHOLANGIOGRAPH 5 50 .035 (SET/KITS/TRAYS/PACK) ×2 IMPLANT
SET CYSTO W/LG BORE CLAMP LF (SET/KITS/TRAYS/PACK) ×1 IMPLANT
SET IRRIG TUBING LAPAROSCOPIC (IRRIGATION / IRRIGATOR) ×2 IMPLANT
SLEEVE ENDOPATH XCEL 5M (ENDOMECHANICALS) ×4 IMPLANT
SPECIMEN JAR SMALL (MISCELLANEOUS) ×2 IMPLANT
SPONGE GAUZE 4X4 12PLY (GAUZE/BANDAGES/DRESSINGS) ×1 IMPLANT
SPONGE LAP 18X18 X RAY DECT (DISPOSABLE) ×4 IMPLANT
SUCTION FRAZIER TIP 10 FR DISP (SUCTIONS) ×1 IMPLANT
SUT ETHILON 2 0 FS 18 (SUTURE) ×2 IMPLANT
SUT MNCRL AB 4-0 PS2 18 (SUTURE) ×2 IMPLANT
SUT PDS AB 1 TP1 96 (SUTURE) ×2 IMPLANT
SUT PDS AB 4-0 RB1 27 (SUTURE) ×6 IMPLANT
SYR 3ML LL SCALE MARK (SYRINGE) ×1 IMPLANT
SYR BULB IRRIGATION 50ML (SYRINGE) ×1 IMPLANT
TAPE CLOTH SURG 4X10 WHT LF (GAUZE/BANDAGES/DRESSINGS) ×1 IMPLANT
TOWEL OR 17X24 6PK STRL BLUE (TOWEL DISPOSABLE) ×4 IMPLANT
TOWEL OR 17X26 10 PK STRL BLUE (TOWEL DISPOSABLE) ×2 IMPLANT
TRAY LAPAROSCOPIC (CUSTOM PROCEDURE TRAY) ×2 IMPLANT
TROCAR XCEL BLUNT TIP 100MML (ENDOMECHANICALS) ×2 IMPLANT
TROCAR XCEL NON-BLD 11X100MML (ENDOMECHANICALS) IMPLANT
TROCAR XCEL NON-BLD 5MMX100MML (ENDOMECHANICALS) ×2 IMPLANT
TUBE CONNECTING 12X1/4 (SUCTIONS) ×1 IMPLANT
WATER STERILE IRR 1000ML POUR (IV SOLUTION) IMPLANT
YANKAUER SUCT BULB TIP NO VENT (SUCTIONS) ×1 IMPLANT

## 2012-11-29 NOTE — Anesthesia Postprocedure Evaluation (Signed)
Anesthesia Post Note  Patient: [name]  Procedure(s) Performed: Procedure(s) (LRB): ATTEMPTED LAPAROSCOPIC CHOLECYSTECTOMY  (N/A) CHOLECYSTECTOMY, COMMON BILE DUCT EXPLORATION, T-TUBE PLACEMENT (N/A)  Anesthesia type: General  Patient location: PACU  Post pain: Pain level controlled  Post assessment: Patient's Cardiovascular Status Stable  Last Vitals:  Filed Vitals:   11/29/12 1515  BP:   Pulse:   Temp: 36.8 C  Resp:     Post vital signs: Reviewed and stable  Level of consciousness: alert  Complications: No apparent anesthesia complications

## 2012-11-29 NOTE — Transfer of Care (Signed)
Immediate Anesthesia Transfer of Care Note  Patient: [name]  Procedure(s) Performed: Procedure(s): ATTEMPTED LAPAROSCOPIC CHOLECYSTECTOMY  (N/A) CHOLECYSTECTOMY, COMMON BILE DUCT EXPLORATION, T-TUBE PLACEMENT (N/A)  Patient Location: PACU  Anesthesia Type:General  Level of Consciousness: awake, alert  and oriented  Airway & Oxygen Therapy: Patient Spontanous Breathing and Patient connected to face mask oxygen  Post-op Assessment: Report given to PACU RN  Post vital signs: Reviewed and stable  Complications: No apparent anesthesia complications

## 2012-11-29 NOTE — OR Nursing (Signed)
Drain removed per Dr. Lindie Spruce preop.

## 2012-11-29 NOTE — Preoperative (Signed)
Beta Blockers   Reason not to administer Beta Blockers:Not Applicable 

## 2012-11-29 NOTE — Anesthesia Preprocedure Evaluation (Signed)
Anesthesia Evaluation  Patient identified by MRN, date of birth, ID band Patient awake    Reviewed: Allergy & Precautions, H&P , NPO status , Patient's Chart, lab work & pertinent test results, reviewed documented beta blocker date and time   History of Anesthesia Complications (+) DIFFICULT AIRWAY  Airway Mallampati: III TM Distance: >3 FB Neck ROM: full    Dental   Pulmonary asthma ,  breath sounds clear to auscultation        Cardiovascular hypertension, Pt. on medications and Pt. on home beta blockers + Peripheral Vascular Disease + dysrhythmias Rhythm:regular     Neuro/Psych Seizures -,   Neuromuscular disease CVA, Residual Symptoms negative psych ROS   GI/Hepatic Neg liver ROS, GERD-  Medicated and Controlled,  Endo/Other  Hypothyroidism   Renal/GU negative Renal ROS  negative genitourinary   Musculoskeletal  (+) Fibromyalgia -  Abdominal   Peds  Hematology negative hematology ROS (+) Blood dyscrasia, ,   Anesthesia Other Findings See surgeon's H&P   Reproductive/Obstetrics negative OB ROS                           Anesthesia Physical Anesthesia Plan  ASA: III  Anesthesia Plan: General   Post-op Pain Management:    Induction: Intravenous  Airway Management Planned: Oral ETT and Video Laryngoscope Planned  Additional Equipment:   Intra-op Plan:   Post-operative Plan: Extubation in OR  Informed Consent: I have reviewed the patients History and Physical, chart, labs and discussed the procedure including the risks, benefits and alternatives for the proposed anesthesia with the patient or authorized representative who has indicated his/her understanding and acceptance.   Dental Advisory Given  Plan Discussed with: CRNA and Surgeon  Anesthesia Plan Comments:         Anesthesia Quick Evaluation

## 2012-11-29 NOTE — Interval H&P Note (Signed)
History and Physical Interval Note:  11/29/2012 7:40 AM  [name]  has presented today for surgery, with the diagnosis of symptomatic cholelithiasis  The various methods of treatment have been discussed with the patient and family. After consideration of risks, benefits and other options for treatment, the patient has consented to  Procedure(s): LAPAROSCOPIC CHOLECYSTECTOMY WITH INTRAOPERATIVE CHOLANGIOGRAM, POSSIBLE IOC (N/A) as a surgical intervention .  The patient's history has been reviewed, patient examined, no change in status, stable for surgery.  I have reviewed the patient's chart and labs.  Questions were answered to the patient's satisfaction.    This patient is well known to me and recently had a blood transfusion.  Last hemoglobin was 5.6 on August 26, and I am not quite sure if that was pre or post transfusion.  This will possibly result in an open procedure.  The patient is aware of this.  Will admit postoperatively to at least a stepdown unit for care.   Marta Lamas. Gae Bon, MD, FACS [phone] [facility]   [name], Marta Lamas

## 2012-11-29 NOTE — H&P (View-Only) (Signed)
The patient has cloudy bilious drainage from her percutaneous gallbladder drain. Her oncologist is sent this for culture and given her a parenteral dose  Of Levaquin. He also order a prescription for oral Levaquin. We will continue that until the patient has her gallbladder removed likely the first week of September.  The patient has been told by her cardiologist that she is clear for surgery. That is Dr. Patty Sermons. She has also been clear for surgery by her oncologist. That is Dr. [name]. She will need a preoperative anesthesia consultation.  On examination today the patient has discomfort around the drain entrance site. It is slightly red. She states that some purulent drainage has come from that site. Currently there is no drainage. A new dressing will be applied.  We plan to do the patient's operation the first week of September.

## 2012-11-29 NOTE — Op Note (Signed)
OPERATIVE REPORT  DATE OF OPERATION: 11/29/2012  PATIENT:  [name]  67 y.o. female  PRE-OPERATIVE DIAGNOSIS:  symptomatic cholelithiasis  POST-OPERATIVE DIAGNOSIS:  symptomatic cholelithiasis  PROCEDURE:  Procedure(s): DIAGNOSTIC LAPAROSCOPY WITH ATTEMPTED LAPAROSCOPIC CHOLECYSTECTOMY  CHOLECYSTECTOMY, COMMON BILE DUCT EXPLORATION, T-TUBE PLACEMENT, CHOLANGIOGRAM  SURGEON:  Surgeon(s): Cherylynn Ridges, MD Mariella Saa, MD  ASSISTANT: Hoxworth  ANESTHESIA:   general  EBL: 300 ml  BLOOD ADMINISTERED: 350 CC PRBC  DRAINS: Urinary Catheter (Foley), (one) Blake drain(s) in the Morrison's pouch and 18x12 T-tube in CBD   SPECIMEN:  Source of Specimen:  Gallbladder and duct stones  COUNTS CORRECT:  YES  PROCEDURE DETAILS: The patient was taken to the operating room and placed on the table in the supine position. After an adequate general endotracheal anesthetic was administered, a proper time out was performed identifying the patient and procedure to be performed, then her previously placed percutaneous gallbladder drainage tube was removed from the right upper quadrant.  The patient was prepped and draped in usual sterile manner exposing her entire abdomen. We started off with a diagnostic laparoscopy placing a Hassan cannula in the infraumbilical area using a pursestring suture of 0 Vicryl. Immediately upon inspecting the peritoneal cavity there were adhesions of the liver to the anterior abdominal wall with a percutaneous inserted into the gallbladder, and significant amount of bowel and omental adhesions to the gallbladder. In spite of that we were able to clear out these adhesions carefully with some bleeding and retracted the gallbladder towards the right upper quadrant after removing the adhesions to the liver. During the process the gallbladder ruptured spilling a significant amount of very small gallstones and purulent gallbladder fluid into the perihepatic area. After  approximately 45 minutes of attempting to dissect out the gallbladder laparoscopically the decision was made to open because of the intense amount of inflammation and inability to adequately discern the anatomy.  The laparoscopic cannulas were removed. A transverse incision paralleling the right costal margin was made using a #10 blade. It was taken down to and through the anterior rectal sheath, then the rectus muscle, then into the peritoneal cavity. We used a self-retaining a Thompson bar retractor in order to attain adequate exposure for this procedure. Even with that it was very difficult to control the gallbladder because of significant amount of inflammation and necrosis. Multiple gallstones over several 100 were spilled into the perihepatic area and most were removed prior to closure. These measure in size from 1 mm up to approximately 1&1/2 cm.  We were able to start to dissect out the gallbladder from the dome down however once we got past the anteriorthe liver there was essentially no posterior wall to the gallbladder in the hepatic bed. We subsequently dissected out the infundibulum and we were able to isolate the cystic duct and the cystic artery. We controlled the cystic duct were able to perform a cholangiogram using a Cook catheter. This showed there was an obstruction of the distal common duct with likely a stone or several stones. Therefore the decision was made to perform a common duct expiration.  The cystic duct was transected and subsequently clipped with hemoclips. We were able to dissect out the common bile duct just at the side of the cystic duct. 2 stay sutures of 4-0 PDS suture were placed and then a cholecysto Deconamine then made using a #15 blade. Using a short #4 biliary Fogarty we able to pass down through the choledochotomy into the ampulla but  were not able to retrieve any stones with the initial passage, then we passed the catheter proximally and no stones were retrieved. We  used a choledochoscope to pass into the common bile duct and 2 stones were seen distally. Subsequently we were able to remove them using a Randall stone forceps and also the biliary catheter. A proximal stone was also removed subsequently after identifying it with the choledochoscope.    Once this was done a 12 x 18 French T-tube was cut to the appropriate dimensions and inserted into the choledochotomy and  2 stitches of 4-0 PDS were placed alongside the T-tube to secure it in place. Upon injecting it with saline there was no leakage. A 19 mm Blake drain was placed in the gallbladder fossa and secured in place and brought out the right upper quadrant. This secured in place with 2-0 nylon. The T-tube was brought out just above the [place] drain and secured in place with 2-0 nylon.  The large number of very small gallstones it is still into the gallbladder fossa were removed mostly by irrigation and suctioning Randall stone forceps and also a ring forcep. There was minimal bleeding of the bed at this point however Surgicel snow was placed in the gallbladder fossa.  Once all drains were in place we did irrigated sufficiently the abdomen was closed.  We closed in a single layer getting both the anterior and posterior rectus sheaths with looped #1 PDS suture. We irrigated the subcutaneous tissue with saline and closed the skin using stainless steel staples. A sterile dressing was applied all needle counts sponge counts and instrument counts were correct the Blake drain was attached to bulb suction, and the T-tube was attached to a gravity drainage bag.  PATIENT DISPOSITION:  PACU - guarded condition.   Cherylynn Ridges 9/3/201412:07 PM

## 2012-11-30 ENCOUNTER — Encounter (HOSPITAL_COMMUNITY): Payer: Self-pay | Admitting: General Surgery

## 2012-11-30 LAB — BASIC METABOLIC PANEL
CO2: 23 mEq/L (ref 19–32)
Calcium: 8.3 mg/dL — ABNORMAL LOW (ref 8.4–10.5)
Chloride: 103 mEq/L (ref 96–112)
Creatinine, Ser: 0.54 mg/dL (ref 0.50–1.10)
Glucose, Bld: 156 mg/dL — ABNORMAL HIGH (ref 70–99)
Sodium: 136 mEq/L (ref 135–145)

## 2012-11-30 LAB — CBC
Hemoglobin: 6.5 g/dL — CL (ref 12.0–15.0)
MCH: 18.4 pg — ABNORMAL LOW (ref 26.0–34.0)
MCV: 64.9 fL — ABNORMAL LOW (ref 78.0–100.0)
RBC: 3.53 MIL/uL — ABNORMAL LOW (ref 3.87–5.11)

## 2012-11-30 MED ORDER — FAMOTIDINE 20 MG PO TABS
20.0000 mg | ORAL_TABLET | Freq: Two times a day (BID) | ORAL | Status: DC | PRN
  Administered 2012-11-30: 20 mg via ORAL
  Filled 2012-11-30: qty 1

## 2012-11-30 MED ORDER — METOCLOPRAMIDE HCL 5 MG/ML IJ SOLN
5.0000 mg | Freq: Four times a day (QID) | INTRAMUSCULAR | Status: AC
  Administered 2012-11-30: 16:00:00 via INTRAVENOUS
  Administered 2012-12-01 – 2012-12-03 (×10): 5 mg via INTRAVENOUS
  Filled 2012-11-30 (×12): qty 1

## 2012-11-30 MED ORDER — METOCLOPRAMIDE HCL 5 MG/ML IJ SOLN
10.0000 mg | Freq: Once | INTRAMUSCULAR | Status: AC
  Administered 2012-11-30: 10 mg via INTRAVENOUS
  Filled 2012-11-30: qty 2

## 2012-11-30 MED ORDER — FENTANYL 25 MCG/HR TD PT72
25.0000 ug | MEDICATED_PATCH | TRANSDERMAL | Status: DC
  Administered 2012-11-30 – 2012-12-03 (×2): 25 ug via TRANSDERMAL
  Filled 2012-11-30 (×2): qty 1

## 2012-11-30 MED ORDER — FUROSEMIDE 10 MG/ML IJ SOLN
40.0000 mg | Freq: Once | INTRAMUSCULAR | Status: AC
  Administered 2012-11-30: 40 mg via INTRAVENOUS
  Filled 2012-11-30: qty 4

## 2012-11-30 MED ORDER — PANTOPRAZOLE SODIUM 40 MG IV SOLR
40.0000 mg | INTRAVENOUS | Status: DC
  Administered 2012-11-30 – 2012-12-03 (×4): 40 mg via INTRAVENOUS
  Filled 2012-11-30 (×5): qty 40

## 2012-11-30 MED ORDER — SODIUM CHLORIDE 0.9 % IV SOLN
500.0000 mg | Freq: Two times a day (BID) | INTRAVENOUS | Status: DC
  Administered 2012-11-30 – 2012-12-04 (×8): 500 mg via INTRAVENOUS
  Filled 2012-11-30 (×9): qty 5

## 2012-11-30 NOTE — Progress Notes (Signed)
[facility]  Patient Status: Active (receiving services up to time of hospitalization)  [facility] is providing the following services: RN  If patient discharges after hours, please call [phone].   Jodene Nam 11/30/2012, 3:35 PM

## 2012-11-30 NOTE — Progress Notes (Signed)
GS Progress Note Subjective: Nausea all night.  Seems as though more drainage is coming out of the [place] drain.  Objective: Vital signs in last 24 hours: Temp:  [97.5 F (36.4 C)-99.3 F (37.4 C)] 98.7 F (37.1 C) (09/04 0807) Pulse Rate:  [80-97] 97 (09/04 0346) Resp:  [15-22] 20 (09/04 0346) BP: (110-133)/(55-73) 132/73 mmHg (09/04 0346) SpO2:  [98 %-100 %] 98 % (09/04 0346) Arterial Line BP: (119-150)/(53-83) 150/60 mmHg (09/03 1600) Weight:  [72 kg (158 lb 11.7 oz)] 72 kg (158 lb 11.7 oz) (09/03 1556)    Intake/Output from previous day: 09/03 0701 - 09/04 0700 In: 3140 [P.O.:240; I.V.:2150; Blood:350; IV Piggyback:400] Out: 1647 [Urine:1075; Drains:272; Blood:300] Intake/Output this shift: Total I/O In: -  Out: 190 [Urine:125; Drains:65]  Lungs: Clear  Abd: Soft, tender around incision.  Wound is okay.  Blake drainage is bilious.  Extremities: No DVT signs or symptoms  Neuro: Intact  Lab Results: CBC   Recent Labs  11/29/12 0744  11/29/12 2010 11/30/12 0440  WBC 7.0  --   --  15.3*  HGB 5.0*  < > 6.6* 6.5*  HCT 19.5*  < > 23.1* 22.9*  PLT 221  --   --  255  < > = values in this interval not displayed. BMET  Recent Labs  11/29/12 1040 11/30/12 0440  NA 134* 136  K 4.0 4.2  CL  --  103  CO2  --  23  GLUCOSE 163* 156*  BUN  --  5*  CREATININE  --  0.54  CALCIUM  --  8.3*   PT/INR No results found for this basename: LABPROT, INR,  in the last 72 hours ABG No results found for this basename: PHART, PCO2, PO2, HCO3,  in the last 72 hours  Studies/Results: Dg Cholangiogram Operative  11/29/2012   *RADIOLOGY REPORT*  Clinical Data: Cholelithiasis. History of percutaneous cholecystostomy tube.  INTRAOPERATIVE CHOLANGIOGRAM  Technique:  C-arm fluoroscopic images were obtained intraoperatively and submitted for postoperative interpretation. Please see the performing provider's procedural report for the fluoroscopy time utilized.  Comparison: CT 09/30/2012   Findings: There is opacification of the common bile duct and the central intrahepatic bile ducts.  There are multiple small filling defects within the common bile duct that are suggestive for stones. There is no definite filling of the duodenum and concern for an obstructing stone in the distal common bile duct.  IMPRESSION: Distal obstruction of the common bile duct.  Multiple filling defects in the common bile duct are suggestive for choledocholithiasis.   Original Report Authenticated By: Richarda Overlie, M.D.    Anti-infectives: Anti-infectives   Start     Dose/Rate Route Frequency Ordered Stop   11/29/12 1630  ciprofloxacin (CIPRO) IVPB 400 mg     400 mg 200 mL/hr over 60 Minutes Intravenous Every 12 hours 11/29/12 1621     11/29/12 0600  ciprofloxacin (CIPRO) IVPB 400 mg     400 mg 200 mL/hr over 60 Minutes Intravenous On call to O.R. 11/28/12 1444 11/29/12 0836      Assessment/Plan: s/p Procedure(s): ATTEMPTED LAPAROSCOPIC CHOLECYSTECTOMY  CHOLECYSTECTOMY, COMMON BILE DUCT EXPLORATION, T-TUBE PLACEMENT Advance diet Scheduled Reglan. Fentanyl patch.  LOS: 1 day    Marta Lamas. Gae Bon, MD, FACS [phone] [facility] 11/30/2012

## 2012-11-30 NOTE — Progress Notes (Signed)
Pt continues to struggle with post op nausea. Pain control issues. Refuses most POs. Please consider IV lasix for fluid overload.

## 2012-11-30 NOTE — Progress Notes (Signed)
Utilization review completed.  

## 2012-12-01 DIAGNOSIS — I119 Hypertensive heart disease without heart failure: Secondary | ICD-10-CM

## 2012-12-01 DIAGNOSIS — C880 Waldenstrom macroglobulinemia: Secondary | ICD-10-CM

## 2012-12-01 DIAGNOSIS — D45 Polycythemia vera: Secondary | ICD-10-CM

## 2012-12-01 LAB — BASIC METABOLIC PANEL
BUN: 6 mg/dL (ref 6–23)
CO2: 28 mEq/L (ref 19–32)
Chloride: 98 mEq/L (ref 96–112)
Creatinine, Ser: 0.52 mg/dL (ref 0.50–1.10)
Glucose, Bld: 147 mg/dL — ABNORMAL HIGH (ref 70–99)

## 2012-12-01 LAB — CBC
HCT: 20.8 % — ABNORMAL LOW (ref 36.0–46.0)
Hemoglobin: 5.9 g/dL — CL (ref 12.0–15.0)
MCV: 64.8 fL — ABNORMAL LOW (ref 78.0–100.0)
RBC: 3.21 MIL/uL — ABNORMAL LOW (ref 3.87–5.11)
WBC: 14.3 10*3/uL — ABNORMAL HIGH (ref 4.0–10.5)

## 2012-12-01 MED ORDER — CLOPIDOGREL BISULFATE 75 MG PO TABS
75.0000 mg | ORAL_TABLET | Freq: Every day | ORAL | Status: DC
  Administered 2012-12-05 – 2012-12-07 (×3): 75 mg via ORAL
  Filled 2012-12-01 (×8): qty 1

## 2012-12-01 MED ORDER — METOPROLOL TARTRATE 1 MG/ML IV SOLN
5.0000 mg | Freq: Four times a day (QID) | INTRAVENOUS | Status: DC
  Administered 2012-12-01: 5 mg via INTRAVENOUS
  Filled 2012-12-01: qty 5

## 2012-12-01 MED ORDER — POTASSIUM CHLORIDE 10 MEQ/50ML IV SOLN
10.0000 meq | INTRAVENOUS | Status: AC
  Administered 2012-12-01 (×3): 10 meq via INTRAVENOUS
  Filled 2012-12-01 (×3): qty 50

## 2012-12-01 MED ORDER — BOOST / RESOURCE BREEZE PO LIQD: 1.0000 | ORAL | Status: DC | PRN

## 2012-12-01 MED ORDER — FUROSEMIDE 10 MG/ML IJ SOLN
40.0000 mg | Freq: Every day | INTRAMUSCULAR | Status: DC
  Administered 2012-12-01 – 2012-12-03 (×3): 40 mg via INTRAVENOUS
  Filled 2012-12-01 (×4): qty 4

## 2012-12-01 MED ORDER — PROMETHAZINE HCL 25 MG/ML IJ SOLN
25.0000 mg | Freq: Three times a day (TID) | INTRAMUSCULAR | Status: DC | PRN
  Administered 2012-12-01 – 2012-12-03 (×6): 25 mg via INTRAVENOUS
  Filled 2012-12-01 (×6): qty 1

## 2012-12-01 MED ORDER — ASPIRIN EC 325 MG PO TBEC
325.0000 mg | DELAYED_RELEASE_TABLET | Freq: Every day | ORAL | Status: DC
  Administered 2012-12-04 – 2012-12-07 (×4): 325 mg via ORAL
  Filled 2012-12-01 (×4): qty 1

## 2012-12-01 MED ORDER — ENALAPRILAT 1.25 MG/ML IV SOLN
0.6250 mg | Freq: Four times a day (QID) | INTRAVENOUS | Status: DC
  Administered 2012-12-01 – 2012-12-03 (×8): 0.625 mg via INTRAVENOUS
  Filled 2012-12-01 (×8): qty 0.5
  Filled 2012-12-01: qty 1
  Filled 2012-12-01: qty 0.5
  Filled 2012-12-01: qty 1
  Filled 2012-12-01 (×4): qty 0.5

## 2012-12-01 MED ORDER — METOPROLOL TARTRATE 1 MG/ML IV SOLN
10.0000 mg | Freq: Four times a day (QID) | INTRAVENOUS | Status: DC
  Administered 2012-12-01 – 2012-12-04 (×11): 10 mg via INTRAVENOUS
  Filled 2012-12-01 (×15): qty 10

## 2012-12-01 NOTE — Progress Notes (Signed)
GS Progress Note Subjective: Patient was just phlebotomized this AM for Hgb 5.9.  She states that she feels better.  Does not like SCDs.  Objective: Vital signs in last 24 hours: Temp:  [98 F (36.7 C)-99.6 F (37.6 C)] 98.5 F (36.9 C) (09/05 0700) Pulse Rate:  [110-119] 110 (09/05 0432) Resp:  [20-29] 20 (09/05 0432) BP: (147-155)/(65-75) 151/75 mmHg (09/05 0432) SpO2:  [96 %-100 %] 96 % (09/05 0432)    Intake/Output from previous day: 09/04 0701 - 09/05 0700 In: 2610 [I.V.:2300; IV Piggyback:310] Out: 2506 [Urine:2180; Drains:326] Intake/Output this shift:    Lungs: Clear.  Tachycardic.  Abd: Soft, some bowel sounds.  Tender as expected.  Extremities: No DVT signs or symptoms  Neuro: Intact  Lab Results: CBC   Recent Labs  11/30/12 0440 12/01/12 0459  WBC 15.3* 14.3*  HGB 6.5* 5.9*  HCT 22.9* 20.8*  PLT 255 200   BMET  Recent Labs  11/30/12 0440 12/01/12 0459  NA 136 134*  K 4.2 3.3*  CL 103 98  CO2 23 28  GLUCOSE 156* 147*  BUN 5* 6  CREATININE 0.54 0.52  CALCIUM 8.3* 8.4   PT/INR No results found for this basename: LABPROT, INR,  in the last 72 hours ABG No results found for this basename: PHART, PCO2, PO2, HCO3,  in the last 72 hours  Studies/Results: Dg Cholangiogram Operative  11/29/2012   *RADIOLOGY REPORT*  Clinical Data: Cholelithiasis. History of percutaneous cholecystostomy tube.  INTRAOPERATIVE CHOLANGIOGRAM  Technique:  C-arm fluoroscopic images were obtained intraoperatively and submitted for postoperative interpretation. Please see the performing provider's procedural report for the fluoroscopy time utilized.  Comparison: CT 09/30/2012  Findings: There is opacification of the common bile duct and the central intrahepatic bile ducts.  There are multiple small filling defects within the common bile duct that are suggestive for stones. There is no definite filling of the duodenum and concern for an obstructing stone in the distal common  bile duct.  IMPRESSION: Distal obstruction of the common bile duct.  Multiple filling defects in the common bile duct are suggestive for choledocholithiasis.   Original Report Authenticated By: Richarda Overlie, M.D.    Anti-infectives: Anti-infectives   Start     Dose/Rate Route Frequency Ordered Stop   11/29/12 1630  ciprofloxacin (CIPRO) IVPB 400 mg     400 mg 200 mL/hr over 60 Minutes Intravenous Every 12 hours 11/29/12 1621     11/29/12 0600  ciprofloxacin (CIPRO) IVPB 400 mg     400 mg 200 mL/hr over 60 Minutes Intravenous On call to O.R. 11/28/12 1444 11/29/12 0836      Assessment/Plan: s/p Procedure(s): ATTEMPTED LAPAROSCOPIC CHOLECYSTECTOMY  CHOLECYSTECTOMY, COMMON BILE DUCT EXPLORATION, T-TUBE PLACEMENT Clear liquids. OOB to chair Replace B-blocker with IV B-blocker Hold Plavix for now Check Hgb in AM  LOS: 2 days    Marta Lamas. Gae Bon, MD, FACS [phone] [facility] 12/01/2012

## 2012-12-01 NOTE — Consult Note (Signed)
Reason for Consult: Postoperative hypertension Referring Physician: Dr. Lindie Spruce Primary cardiologist: Dr. Patty Sermons Primary hematologist: Dr. Denzil Magnuson [name] is an 67 y.o. female.  HPI: This 67 year old woman is known to me.  He has a past history of frequent premature atrial and ventricular beats.  She has a long history of hypertensive cardiovascular disease.  She has hyperviscosity variant of polycythemia vera.  She underwent cholecystectomy on 11/29/12 by Dr. Lindie Spruce.  Postoperatively she has had severe nausea and has not been able to take any of her oral blood pressure medication.  Normally she is on Lasix, bisoprolol, and lisinopril.  She states that she has not taken hydrochlorothiazide for several months.  Postoperatively she has had problems with elevated blood pressure.  She has had mild fluid overload which is responding to IV Lasix with good urine output.  The patient has normal renal function.  She denies any chest pain.  She has not been experiencing any dyspnea and she is lying flat in bed at the present time.  Past Medical History  Diagnosis Date  . 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 Fibromyalgia     chronic pain syndrome  . Postmenopausal state     on hormone replacement therapy  . Aortic valve sclerosis     by echocardiogram 12/04/2009  . Hyperlipidemia   . 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 GERD (gastroesophageal reflux disease)   . Difficult intubation     grade 3 airway  . PAC (premature atrial contraction)   . Hypertension   . Asthma   . Seizures     only seizure was 10/14/12;takes Keppra daily  . Chronic constipation     takes Mineral Oil,Juice,Enema(prn),and Miralax(Prn) and Cascara nightly  . Insomnia     takes restoril nightly and Xanax  . Hypothyroidism   . Seasonal allergies     takes Allegra in am and Benadryl at night  . Occlusion and stenosis of carotid artery without mention of  cerebral infarction 10/26/2012  . 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 Acute cholecystitis 10/07/2012    s/p pec drain due to recent CVA, pending chole 11/2012    Past Surgical History  Procedure Laterality Date  . Endarterectomy Left 10/10/2012    Procedure: ENDARTERECTOMY CAROTID- LEFT NECK WITH GREATER SAPHENOUS VEIN PATCH LEFT LEG;  Surgeon: Sherren Kerns, MD;  Location: [facility] OR;  Service: Vascular;  Laterality: Left;  . Carotid endarterectomy Left 10-10-12    cea  . Abdominal hysterectomy  1996  . Tonsillectomy      age 38  . Nasal septum surgery      late 70's  . Colonoscopy    . Insertion of drain  10/02/12    right low abdomen and draining pus  . Picc line placed      10/15/12  . Cholecystectomy N/A 11/29/2012    Procedure: ATTEMPTED LAPAROSCOPIC CHOLECYSTECTOMY ;  Surgeon: Cherylynn Ridges, MD;  Location: [facility] OR;  Service: General;  Laterality: N/A;  . Cholecystectomy N/A 11/29/2012    Procedure: CHOLECYSTECTOMY, COMMON BILE DUCT EXPLORATION, T-TUBE PLACEMENT;  Surgeon: Cherylynn Ridges, MD;  Location: [facility] OR;  Service: General;  Laterality: N/A;    Family History  Problem Relation Age of Onset  . Heart attack Father   . Coronary artery disease Mother     had aortic valve replacement    Social History:  reports that she has never  smoked. She has never used smokeless tobacco. She reports that she does not drink alcohol or use illicit drugs.  Allergies:  Allergies  Allergen Reactions  . Codeine Nausea Only  . Synthroid [Levothyroxine Sodium] Other (See Comments)    Not effective. Causes excessive sleepiness. Takes armour thyroid  . Calcium Channel Blockers Palpitations  . Penicillins Rash    Medications:  Scheduled: . ALPRAZolam  0.5 mg Oral QHS  . aspirin EC  325 mg Oral Daily  . bisoprolol  10 mg Oral QPC breakfast  . cholecalciferol  3,000 Units Oral QPC breakfast  . ciprofloxacin  400 mg Intravenous Q12H  . [START ON 12/02/2012] clopidogrel  75 mg Oral Q breakfast   . cyclobenzaprine  10 mg Oral QHS  . docusate sodium  100 mg Oral TID WC  . estrogens (conjugated)  0.625 mg Oral QPC breakfast  . famotidine  20 mg Oral BID AC  . fentaNYL  25 mcg Transdermal Q72H  . furosemide  40 mg Intravenous Daily  . furosemide  80 mg Oral QPC breakfast  . levETIRAcetam  500 mg Intravenous Q12H  . metoCLOPramide (REGLAN) injection  5 mg Intravenous Q6H  . metoprolol  10 mg Intravenous Q6H  . mineral oil  30 mL Oral QHS  . morphine   Intravenous Q4H  . OxyCODONE  20 mg Oral Custom  . pantoprazole (PROTONIX) IV  40 mg Intravenous Q24H  . potassium chloride  10 mEq Oral QHS  . thyroid  150 mg Oral QPC breakfast  . zolpidem  5 mg Oral QHS    Results for orders placed during the hospital encounter of 11/29/12 (from the past 48 hour(s))  HEMOGLOBIN AND HEMATOCRIT, BLOOD     Status: Abnormal   Collection Time    11/29/12  8:10 PM      Result Value Range   Hemoglobin 6.6 (*) 12.0 - 15.0 g/dL   Comment: CRITICAL VALUE NOTED.  VALUE IS CONSISTENT WITH PREVIOUSLY REPORTED AND CALLED VALUE.   HCT 23.1 (*) 36.0 - 46.0 %  GLUCOSE, CAPILLARY     Status: Abnormal   Collection Time    11/29/12  9:40 PM      Result Value Range   Glucose-Capillary 145 (*) 70 - 99 mg/dL   Comment 1 Documented in Chart     Comment 2 Notify RN    CBC     Status: Abnormal   Collection Time    11/30/12  4:40 AM      Result Value Range   WBC 15.3 (*) 4.0 - 10.5 K/uL   RBC 3.53 (*) 3.87 - 5.11 MIL/uL   Hemoglobin 6.5 (*) 12.0 - 15.0 g/dL   Comment: CRITICAL VALUE NOTED.  VALUE IS CONSISTENT WITH PREVIOUSLY REPORTED AND CALLED VALUE.     REPEATED TO VERIFY   HCT 22.9 (*) 36.0 - 46.0 %   MCV 64.9 (*) 78.0 - 100.0 fL   MCH 18.4 (*) 26.0 - 34.0 pg   MCHC 28.4 (*) 30.0 - 36.0 g/dL   RDW 65.7 (*) 84.6 - 96.2 %   Platelets 255  150 - 400 K/uL  BASIC METABOLIC PANEL     Status: Abnormal   Collection Time    11/30/12  4:40 AM      Result Value Range   Sodium 136  135 - 145 mEq/L    Potassium 4.2  3.5 - 5.1 mEq/L   Chloride 103  96 - 112 mEq/L   CO2 23  19 -  32 mEq/L   Glucose, Bld 156 (*) 70 - 99 mg/dL   BUN 5 (*) 6 - 23 mg/dL   Creatinine, Ser 0.45  0.50 - 1.10 mg/dL   Calcium 8.3 (*) 8.4 - 10.5 mg/dL   GFR calc non Af Amer >90  >90 mL/min   GFR calc Af Amer >90  >90 mL/min   Comment: (NOTE)     The eGFR has been calculated using the CKD EPI equation.     This calculation has not been validated in all clinical situations.     eGFR's persistently <90 mL/min signify possible Chronic Kidney     Disease.  GLUCOSE, CAPILLARY     Status: Abnormal   Collection Time    11/30/12  7:55 AM      Result Value Range   Glucose-Capillary 148 (*) 70 - 99 mg/dL  CBC     Status: Abnormal   Collection Time    12/01/12  4:59 AM      Result Value Range   WBC 14.3 (*) 4.0 - 10.5 K/uL   RBC 3.21 (*) 3.87 - 5.11 MIL/uL   Hemoglobin 5.9 (*) 12.0 - 15.0 g/dL   Comment: REPEATED TO VERIFY     CRITICAL RESULT CALLED TO, READ BACK BY AND VERIFIED WITH:     L. WORLDLY RN [phone] GREEN R   HCT 20.8 (*) 36.0 - 46.0 %   MCV 64.8 (*) 78.0 - 100.0 fL   MCH 18.4 (*) 26.0 - 34.0 pg   MCHC 28.4 (*) 30.0 - 36.0 g/dL   RDW 91.4 (*) 78.2 - 95.6 %   Platelets 200  150 - 400 K/uL   Comment: PLATELET COUNT CONFIRMED BY SMEAR  BASIC METABOLIC PANEL     Status: Abnormal   Collection Time    12/01/12  4:59 AM      Result Value Range   Sodium 134 (*) 135 - 145 mEq/L   Potassium 3.3 (*) 3.5 - 5.1 mEq/L   Comment: DELTA CHECK NOTED   Chloride 98  96 - 112 mEq/L   CO2 28  19 - 32 mEq/L   Glucose, Bld 147 (*) 70 - 99 mg/dL   BUN 6  6 - 23 mg/dL   Creatinine, Ser 2.13  0.50 - 1.10 mg/dL   Calcium 8.4  8.4 - 08.6 mg/dL   GFR calc non Af Amer >90  >90 mL/min   GFR calc Af Amer >90  >90 mL/min   Comment: (NOTE)     The eGFR has been calculated using the CKD EPI equation.     This calculation has not been validated in all clinical situations.     eGFR's persistently <90 mL/min signify  possible Chronic Kidney     Disease.    No results found.  Review of systems positive for severe nausea. Blood pressure 156/73, pulse 122, temperature 99.8 F (37.7 C), temperature source Oral, resp. rate 26, height 5\' 3"  (1.6 m), weight 158 lb 11.7 oz (72 kg), SpO2 95.00%. The patient appears to be in no distress.  She is very pale which is normal for her.  She was phlebotomized this morning for hemoglobin 5.9  Head and neck exam reveals that the pupils are equal and reactive.  The extraocular movements are full.  There is no scleral icterus.  Mouth and pharynx are benign.  No lymphadenopathy.  No carotid bruits.  The jugular venous pressure is normal.  Thyroid is not enlarged or tender.  Chest is  clear to percussion and auscultation.  No rales or rhonchi.  Expansion of the chest is symmetrical.  Heart reveals no abnormal lift or heave.  First and second heart sounds are normal.  There is no murmur gallop rub or click.  Rhythm is sinus tachycardia with frequent PACs.  The abdomen is soft and nontender.  Bowel sounds are present.  There is no hepatosplenomegaly or mass.  There are no abdominal bruits.  Dressing in right upper quadrant with drains is intact  Extremities reveal no phlebitis or edema.  Pedal pulses are good.  There is no cyanosis or clubbing.  Neurologic exam is normal strength and no lateralizing weakness.  No sensory deficits.  Integument reveals no rash  EKG dated 11/21/12 showed normal sinus rhythm and was within normal limits  2-D echocardiogram dated 10/04/12: Study Conclusions  - Left ventricle: The cavity size was normal. Wall thickness was increased in a pattern of mild LVH. Systolic function was vigorous. The estimated ejection fraction was in the range of 65% to 70%. Wall motion was normal; there were no regional wall motion abnormalities. Doppler parameters are consistent with abnormal left ventricular relaxation (grade 1 diastolic dysfunction). - Right  atrium: The atrium was mildly dilated. - Pulmonary arteries: PA peak pressure: 53mm Hg (S).    Assessment/Plan: 1.  Status post cholecystectomy with postoperative nausea and unable to take her normal oral medication 2.  hypertensive cardiovascular disease 3. sinus tachycardia with frequent PACs  Plan: We'll continue to give metoprolol IV until she is able to resume taking by mouth. To replace her lisinopril will use IV enalaprilat 0.625 mg Q6H if systolic >130. Will follow with you. [name] 12/01/2012, 6:58 PM

## 2012-12-01 NOTE — Consult Note (Signed)
#  [id number] is consult note.  Sarah E.

## 2012-12-01 NOTE — Consult Note (Signed)
NAMEJENESE, [name]                 ACCOUNT NO.:  [id number]  MEDICAL RECORD NO.:  [id number]  LOCATION:  [id number]                        FACILITY:  [facility]  PHYSICIAN:  Josph Macho, M.D.  DATE OF BIRTH:  May 11, 1945  DATE OF CONSULTATION: DATE OF DISCHARGE:                                CONSULTATION   REFERRING PHYSICIAN:  Cherylynn Ridges, M.D.  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  HISTORY PRESENT ILLNESS:  [name] is well known to me.  She is 67 years old.  She has a very "active" past history.  We have been seeing her now for about 12 or 13 years because of the polycythemia.  She has a hyperviscosity variant in which we need to keep her blood count down with a hemoglobin of about 6.  She has had recent issues with respect to CVA.  She was hospitalized previously because of cholecystitis.  During that hospitalization, she had a CVA.  She underwent left carotid endarterectomy.  She is on Plavix and aspirin.  Again while in the hospital, she developed a left frontoparietal CVA. This was a thrombotic event.  She now recently underwent a cholecystectomy.  She had a very inflamed and apparently infected gallbladder.  This was removed by Dr. Lindie Spruce. This was very tough surgery, but Dr. Lindie Spruce did a great job.  Postop, she has had a lot of problems with nausea.  She is on clear liquids.  She is complaining of headaches.  This usually is a sign that her blood is too "thick."  As such, I will go ahead and phlebotomize her.  Again, she has a lot of other issues.  She is on a numerous medications.  She has I think drainage tubes in.  Her blood pressure has been on the high side.  She has been having a little bit difficulty with her oral medications.  Today, her hemoglobin is 5.9 with hematocrit of 20.8.  This may be a little dilutional with all the fluids that she is getting.  Potassium is a little on the low side at 3.3.  PHYSICAL  EXAMINATION:  VITAL SIGNS:  Temperature 98, pulse 100, respiratory rate 20, and blood pressure 151/75. LUNGS:  Clear bilaterally. CARDIAC:  Tachycardic and regular.  She has no murmurs, rubs, or bruits. ABDOMEN:  Tender.  She has abdominal wound dressing.  She has markedly decreased bowel sounds.  There is no distention. EXTREMITIES:  No clubbing, cyanosis, or edema. NEUROLOGIC:  Some slight weakness in the right upper extremity.  IMPRESSION:  [name] is a 67 year old, white female.  She has polycythemia.  She is incredibly hyper-viscous.  Again, I think that a phlebotomy could help her out.  I think that the hemoglobin of 5.9 is somewhat dilutional.  Her blood pressure certainly could handle a phlebotomy.  I think that we can probably get her back on aspirin and Plavix.  I think this would be helpful to try to minimize risk of another CVA or other neurological event.  Again, Dr. Lindie Spruce has done a great job.  [name] is incredibly complicated medically.  I am sure that other consultants will come and try  to help manage her other issues.  We will certainly follow her along very closely.     Josph Macho, M.D.     PRE/MEDQ  D:  12/01/2012  T:  12/01/2012  Job:  [id number]

## 2012-12-01 NOTE — Progress Notes (Signed)
INITIAL NUTRITION ASSESSMENT  DOCUMENTATION CODES Per approved criteria  -Not Applicable   INTERVENTION:  Resource Breeze prn, each supplement provides 250 kcal and 9 grams of protein.  Add [place] Ice to breakfast trays per patient request.  NUTRITION DIAGNOSIS: Inadequate oral intake related to recent surgery with anticipated altered GI function as evidenced by clear liquid diet.   Goal: Intake to meet >90% of estimated nutrition needs.  Monitor:  Diet advancement, PO intake, labs, weight trend.  Reason for Assessment: MST=3  67 y.o. female  Admitting Dx: Symptomatic cholelithiasis  ASSESSMENT: S/P laparoscopic cholecystectomy on 9/3. Phlebotomized this AM for Hgb 5.9 (history of polycythemia vera). Recent hospitalization in July for CVA. Patient reports that she has lost 20 lb since July 5th. According to usual weights below, patient has lost ~8% of her usual weight over the past 2 months. She reports good appetite and good PO intake PTA, but has been nauseous since admission and has eaten very little. Her husband requests that she receive an [place] Ice for breakfast in the morning. She declines supplements at this time.  Nutrition focused physical exam completed.  No muscle or subcutaneous fat depletion noticed.   Height: Ht Readings from Last 1 Encounters:  11/29/12 5\' 3"  (1.6 m)    Weight: Wt Readings from Last 1 Encounters:  11/29/12 158 lb 11.7 oz (72 kg)    Ideal Body Weight: 52.3 kg  % Ideal Body Weight: 138%   Wt Readings from Last 20 Encounters:  11/29/12 158 lb 11.7 oz (72 kg)  11/29/12 158 lb 11.7 oz (72 kg)  11/29/12 158 lb 11.7 oz (72 kg)  11/24/12 159 lb (72.122 kg)  11/21/12 158 lb 2 oz (71.725 kg)  11/09/12 151 lb 12.8 oz (68.856 kg)  11/01/12 159 lb 6.4 oz (72.303 kg)  10/26/12 153 lb (69.4 kg)  10/24/12 178 lb (80.74 kg)  10/15/12 162 lb 14.7 oz (73.9 kg)  10/12/12 172 lb 13.5 oz (78.4 kg)  10/12/12 172 lb 13.5 oz (78.4 kg)  10/12/12  172 lb 13.5 oz (78.4 kg)  08/16/12 165 lb (74.844 kg)  06/27/12 165 lb (74.844 kg)  04/26/12 172 lb (78.019 kg)  02/16/12 176 lb (79.833 kg)  12/22/11 178 lb (80.74 kg)  11/18/11 175 lb (79.379 kg)  11/04/11 172 lb (78.019 kg)    Usual Body Weight: 172 lb 2 months ago  % Usual Body Weight: 92%  BMI:  Body mass index is 28.13 kg/(m^2). Overweight  Estimated Nutritional Needs: Kcal: 1600-1800 Protein: 80-100 gm Fluid: 1.6-1.8 L  Skin: no problems  Diet Order: Clear Liquid  EDUCATION NEEDS: -Education not appropriate at this time   Intake/Output Summary (Last 24 hours) at 12/01/12 1409 Last data filed at 12/01/12 1100  Gross per 24 hour  Intake 4263.75 ml  Output   2331 ml  Net 1932.75 ml    Last BM: none documented since admission   Labs:   Recent Labs Lab 11/29/12 1040 11/30/12 0440 12/01/12 0459  NA 134* 136 134*  K 4.0 4.2 3.3*  CL  --  103 98  CO2  --  23 28  BUN  --  5* 6  CREATININE  --  0.54 0.52  CALCIUM  --  8.3* 8.4  GLUCOSE 163* 156* 147*    CBG (last 3)   Recent Labs  11/29/12 2140 11/30/12 0755  GLUCAP 145* 148*    Scheduled Meds: . ALPRAZolam  0.5 mg Oral QHS  . aspirin EC  325 mg  Oral Daily  . bisoprolol  10 mg Oral QPC breakfast  . cholecalciferol  3,000 Units Oral QPC breakfast  . ciprofloxacin  400 mg Intravenous Q12H  . [START ON 12/02/2012] clopidogrel  75 mg Oral Q breakfast  . cyclobenzaprine  10 mg Oral QHS  . docusate sodium  100 mg Oral TID WC  . estrogens (conjugated)  0.625 mg Oral QPC breakfast  . famotidine  20 mg Oral BID AC  . fentaNYL  25 mcg Transdermal Q72H  . furosemide  40 mg Intravenous Daily  . furosemide  80 mg Oral QPC breakfast  . levETIRAcetam  500 mg Intravenous Q12H  . metoCLOPramide (REGLAN) injection  5 mg Intravenous Q6H  . metoprolol  10 mg Intravenous Q6H  . mineral oil  30 mL Oral QHS  . morphine   Intravenous Q4H  . OxyCODONE  20 mg Oral Custom  . pantoprazole (PROTONIX) IV  40 mg  Intravenous Q24H  . potassium chloride  10 mEq Oral QHS  . thyroid  150 mg Oral QPC breakfast  . zolpidem  5 mg Oral QHS    Continuous Infusions: . dextrose 5 % and 0.45 % NaCl with KCl 20 mEq/L 75 mL/hr at 12/01/12 1100    Past Medical History  Diagnosis Date  . 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 Fibromyalgia     chronic pain syndrome  . Postmenopausal state     on hormone replacement therapy  . Aortic valve sclerosis     by echocardiogram 12/04/2009  . Hyperlipidemia   . 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 GERD (gastroesophageal reflux disease)   . Difficult intubation     grade 3 airway  . PAC (premature atrial contraction)   . Hypertension   . Asthma   . Seizures     only seizure was 10/14/12;takes Keppra daily  . Chronic constipation     takes Mineral Oil,Juice,Enema(prn),and Miralax(Prn) and Cascara nightly  . Insomnia     takes restoril nightly and Xanax  . Hypothyroidism   . Seasonal allergies     takes Allegra in am and Benadryl at night  . Occlusion and stenosis of carotid artery without mention of cerebral infarction 10/26/2012  . 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 Acute cholecystitis 10/07/2012    s/p pec drain due to recent CVA, pending chole 11/2012    Past Surgical History  Procedure Laterality Date  . Endarterectomy Left 10/10/2012    Procedure: ENDARTERECTOMY CAROTID- LEFT NECK WITH GREATER SAPHENOUS VEIN PATCH LEFT LEG;  Surgeon: Sherren Kerns, MD;  Location: [facility] OR;  Service: Vascular;  Laterality: Left;  . Carotid endarterectomy Left 10-10-12    cea  . Abdominal hysterectomy  1996  . Tonsillectomy      age 5  . Nasal septum surgery      late 70's  . Colonoscopy    . Insertion of drain  10/02/12    right low abdomen and draining pus  . Picc line placed      10/15/12  . Cholecystectomy N/A 11/29/2012    Procedure: ATTEMPTED LAPAROSCOPIC CHOLECYSTECTOMY ;  Surgeon: Cherylynn Ridges, MD;  Location: [facility] OR;  Service: General;  Laterality: N/A;  . Cholecystectomy N/A 11/29/2012    Procedure: CHOLECYSTECTOMY, COMMON BILE DUCT EXPLORATION, T-TUBE PLACEMENT;  Surgeon: Cherylynn Ridges, MD;  Location: [facility] OR;  Service: General;  Laterality: N/A;    Joaquin Courts,  RD, LDN, CNSC Pager [phone] After Hours Pager [phone]

## 2012-12-02 LAB — CBC WITH DIFFERENTIAL/PLATELET
Basophils Absolute: 0 10*3/uL (ref 0.0–0.1)
Basophils Relative: 0 % (ref 0–1)
Eosinophils Absolute: 0 10*3/uL (ref 0.0–0.7)
HCT: 19.1 % — ABNORMAL LOW (ref 36.0–46.0)
Lymphs Abs: 0.8 10*3/uL (ref 0.7–4.0)
MCHC: 28.3 g/dL — ABNORMAL LOW (ref 30.0–36.0)
MCV: 65 fL — ABNORMAL LOW (ref 78.0–100.0)
Neutro Abs: 13.3 10*3/uL — ABNORMAL HIGH (ref 1.7–7.7)
RDW: 27.1 % — ABNORMAL HIGH (ref 11.5–15.5)

## 2012-12-02 LAB — BASIC METABOLIC PANEL
BUN: 9 mg/dL (ref 6–23)
Chloride: 97 mEq/L (ref 96–112)
Creatinine, Ser: 0.52 mg/dL (ref 0.50–1.10)
GFR calc Af Amer: >90 mL/min (ref >90)
GFR calc non Af Amer: >90 mL/min (ref >90)
Glucose, Bld: 115 mg/dL — ABNORMAL HIGH (ref 70–99)

## 2012-12-02 MED ORDER — ACYCLOVIR 5 % EX OINT
TOPICAL_OINTMENT | CUTANEOUS | Status: DC
  Administered 2012-12-02 – 2012-12-07 (×30): via TOPICAL
  Filled 2012-12-02: qty 30

## 2012-12-02 NOTE — Progress Notes (Signed)
[name] looks better. She feels a little better. She thinks that the phlebotomy helped quite a bit.  She still has the abdominal pain. She was out of bed a little bit yesterday.  She now is back on aspirin and Plavix. No bleeding noted.  Her vital signs are stable. Temperature 90.8 pulse 89 blood pressure 129/70. Lungs are clear bilaterally. Cardiac exam regular in rhythm with a 1/6 systolic ejection murmur. Abdomen is soft. She has decreased bowel sounds. There is some tenderness throughout the abdomen. Extremities shows no clubbing cyanosis or edema.  Her labs show white cell count 15. Hemoglobin 5.4. Platelet count 224. Potassium 3.3. Glucose 115.  I suspect that she will have a slow recovery. She had a very tough surgery. Pathology report shows cholecystitis.  We will continue to monitor her blood count. She definitely does best with a hemoglobin below 6. Again, she has very viscous blood and just does better and feels better with her blood count below 6.   Hopefully, her activity level will begin to increase a little bit.   Pete E.

## 2012-12-02 NOTE — Progress Notes (Signed)
Agree with A&P of KO,PA. Patient main issue is nausea, abd benign and does not appear to have bile in JP, only t-tube

## 2012-12-02 NOTE — Progress Notes (Signed)
Patient ID: [name], female   DOB: 03/06/1946, 67 y.o.   MRN: [id number] 3 Days Post-Op  Subjective: Pt still nauseous. Taking in minimal clear liquids.  Objective: Vital signs in last 24 hours: Temp:  [98 F (36.7 C)-99.8 F (37.7 C)] 98.6 F (37 C) (09/06 0700) Pulse Rate:  [89-126] 99 (09/06 0740) Resp:  [17-27] 21 (09/06 0740) BP: (121-156)/(69-83) 141/69 mmHg (09/06 0740) SpO2:  [94 %-99 %] 99 % (09/06 0740)    Intake/Output from previous day: 09/05 0701 - 09/06 0700 In: 2583.8 [I.V.:2478.8; IV Piggyback:105] Out: 2165 [Urine:1700; Drains:465] Intake/Output this shift: Total I/O In: 600 [I.V.:600] Out: -   PE: Abd: soft, appropriately tender, all incisions are c/d/i with staples present.  Both her T-tube and her JP have bilious output present. Heart: reagular Lungs: CTAB  Lab Results:   Recent Labs  12/01/12 0459 12/02/12 0440  WBC 14.3* 15.3*  HGB 5.9* 5.4*  HCT 20.8* 19.1*  PLT 200 224   BMET  Recent Labs  12/01/12 0459 12/02/12 0440  NA 134* 134*  K 3.3* 3.3*  CL 98 97  CO2 28 29  GLUCOSE 147* 115*  BUN 6 9  CREATININE 0.52 0.52  CALCIUM 8.4 8.4   PT/INR No results found for this basename: LABPROT, INR,  in the last 72 hours CMP     Component Value Date/Time   NA 134* 12/02/2012 0440   K 3.3* 12/02/2012 0440   CL 97 12/02/2012 0440   CO2 29 12/02/2012 0440   GLUCOSE 115* 12/02/2012 0440   BUN 9 12/02/2012 0440   CREATININE 0.52 12/02/2012 0440   CALCIUM 8.4 12/02/2012 0440   PROT 6.3 11/21/2012 1500   ALBUMIN 2.8* 11/21/2012 1500   AST 33 11/21/2012 1500   ALT 24 11/21/2012 1500   ALKPHOS 304* 11/21/2012 1500   BILITOT 1.2 11/21/2012 1500   GFRNONAA >90 12/02/2012 0440   GFRAA >90 12/02/2012 0440   Lipase     Component Value Date/Time   LIPASE 23 09/30/2012 0039       Studies/Results: No results found.  Anti-infectives: Anti-infectives   Start     Dose/Rate Route Frequency Ordered Stop   11/29/12 1630  ciprofloxacin (CIPRO) IVPB 400 mg      400 mg 200 mL/hr over 60 Minutes Intravenous Every 12 hours 11/29/12 1621     11/29/12 0600  ciprofloxacin (CIPRO) IVPB 400 mg     400 mg 200 mL/hr over 60 Minutes Intravenous On call to O.R. 11/28/12 1444 11/29/12 0836       Assessment/Plan  1. S/p open chole with CBD exploration Patient Active Problem List   Diagnosis Date Noted  . Occlusion and stenosis of carotid artery without mention of cerebral infarction 10/26/2012  . Cholecystostomy tube dysfunction 10/24/2012  . 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 Acute cholecystitis 10/07/2012  . CVA (cerebral infarction) 10/05/2012  . Septic shock 10/02/2012  . Cholelithiases 09/30/2012  . Unspecified constipation 09/30/2012  . Bronchitis 03/08/2011  . Benign hypertensive heart disease without heart failure 09/28/2010  . Aortic valve sclerosis 09/28/2010  . Hypothyroidism 09/28/2010  . Polycythemia vera(238.4) 09/28/2010  . Postmenopausal state 09/28/2010  . Fibromyalgia 09/28/2010   Plan: 1. Continue on the clear liquids for now 2. Check cbc in am 3. Defer PCV treatment and blood levels to Dr. [name] 4. Mobilize    LOS: 3 days    Cyndel Griffey E 12/02/2012, 11:59 AM Pager: [phone]

## 2012-12-02 NOTE — Progress Notes (Signed)
Subjective:  Feels slightly better this am. Nausea is somewhat less. BP trending down on IV meds. Rhythm stable sinus tach with PACs  Objective:  Vital Signs in the last 24 hours: Temp:  [98 F (36.7 C)-99.8 F (37.7 C)] 98 F (36.7 C) (09/06 0500) Pulse Rate:  [89-126] 89 (09/06 0641) Resp:  [17-27] 17 (09/06 0641) BP: (121-156)/(69-83) 129/70 mmHg (09/06 0400) SpO2:  [94 %-99 %] 99 % (09/06 0641)  Intake/Output from previous day: 09/05 0701 - 09/06 0700 In: 2583.8 [I.V.:2478.8; IV Piggyback:105] Out: 1695 [Urine:1475; Drains:220] Intake/Output from this shift:    . ALPRAZolam  0.5 mg Oral QHS  . aspirin EC  325 mg Oral Daily  . bisoprolol  10 mg Oral QPC breakfast  . cholecalciferol  3,000 Units Oral QPC breakfast  . ciprofloxacin  400 mg Intravenous Q12H  . clopidogrel  75 mg Oral Q breakfast  . cyclobenzaprine  10 mg Oral QHS  . docusate sodium  100 mg Oral TID WC  . enalaprilat  0.625 mg Intravenous Q6H  . estrogens (conjugated)  0.625 mg Oral QPC breakfast  . famotidine  20 mg Oral BID AC  . fentaNYL  25 mcg Transdermal Q72H  . furosemide  40 mg Intravenous Daily  . furosemide  80 mg Oral QPC breakfast  . levETIRAcetam  500 mg Intravenous Q12H  . metoCLOPramide (REGLAN) injection  5 mg Intravenous Q6H  . metoprolol  10 mg Intravenous Q6H  . mineral oil  30 mL Oral QHS  . morphine   Intravenous Q4H  . OxyCODONE  20 mg Oral Custom  . pantoprazole (PROTONIX) IV  40 mg Intravenous Q24H  . potassium chloride  10 mEq Oral QHS  . thyroid  150 mg Oral QPC breakfast  . zolpidem  5 mg Oral QHS   . dextrose 5 % and 0.45 % NaCl with KCl 20 mEq/L 75 mL/hr at 12/02/12 0432    Physical Exam: The patient appears to be in no distress.  Head and neck exam reveals that the pupils are equal and reactive.  The extraocular movements are full.  There is no scleral icterus.  Mouth and pharynx are benign.  No lymphadenopathy.  No carotid bruits.  The jugular venous pressure is  normal.  Thyroid is not enlarged or tender.  Chest is clear to percussion and auscultation.  No rales or rhonchi.  Expansion of the chest is symmetrical.  Heart reveals no abnormal lift or heave.  First and second heart sounds are normal.  There is no  gallop rub or click. Grade 2/6 systolic flow murmur at upper LSE.  The abdomen is soft and nontender.  Bowel sounds are soft.  There is no hepatosplenomegaly or mass.  There are no abdominal bruits.  Extremities reveal no phlebitis or edema.  Pedal pulses are good.  There is no cyanosis or clubbing.  Neurologic exam is normal strength and no lateralizing weakness.  No sensory deficits.  Integument reveals no rash  Lab Results:  Recent Labs  12/01/12 0459 12/02/12 0440  WBC 14.3* 15.3*  HGB 5.9* 5.4*  PLT 200 224    Recent Labs  12/01/12 0459 12/02/12 0440  NA 134* 134*  K 3.3* 3.3*  CL 98 97  CO2 28 29  GLUCOSE 147* 115*  BUN 6 9  CREATININE 0.52 0.52   No results found for this basename: TROPONINI, CK, MB,  in the last 72 hours Hepatic Function Panel No results found for this basename: PROT, ALBUMIN,  AST, ALT, ALKPHOS, BILITOT, BILIDIR, IBILI,  in the last 72 hours No results found for this basename: CHOL,  in the last 72 hours No results found for this basename: PROTIME,  in the last 72 hours  Imaging: No results found.  Cardiac Studies: 2-D echocardiogram dated 10/04/12:  Study Conclusions  - Left ventricle: The cavity size was normal. Wall thickness was increased in a pattern of mild LVH. Systolic function was vigorous. The estimated ejection fraction was in the range of 65% to 70%. Wall motion was normal; there were no regional wall motion abnormalities. Doppler parameters are consistent with abnormal left ventricular relaxation (grade 1 diastolic dysfunction). - Right atrium: The atrium was mildly dilated. - Pulmonary arteries: PA peak pressure: 53mm Hg (S).  Assessment/Plan:  1. Status post  cholecystectomy with postoperative nausea and unable to take her normal oral medication  2. hypertensive cardiovascular disease  3. sinus tachycardia with frequent PACs  Plan: continue IV meds until able to take po. Potassium repletion per primary service.  LOS: 3 days    [name] 12/02/2012, 7:47 AM

## 2012-12-03 ENCOUNTER — Inpatient Hospital Stay (HOSPITAL_COMMUNITY): Payer: Medicare Other

## 2012-12-03 DIAGNOSIS — B370 Candidal stomatitis: Secondary | ICD-10-CM

## 2012-12-03 DIAGNOSIS — B373 Candidiasis of vulva and vagina: Secondary | ICD-10-CM

## 2012-12-03 LAB — CBC
HCT: 22.6 % — ABNORMAL LOW (ref 36.0–46.0)
HCT: 22.2 % — ABNORMAL LOW (ref 36.0–46.0)
MCH: 18.1 pg — ABNORMAL LOW (ref 26.0–34.0)
MCH: 18 pg — ABNORMAL LOW (ref 26.0–34.0)
MCHC: 27.9 g/dL — ABNORMAL LOW (ref 30.0–36.0)
MCHC: 27.9 g/dL — ABNORMAL LOW (ref 30.0–36.0)
MCV: 64.3 fL — ABNORMAL LOW (ref 78.0–100.0)
Platelets: 362 10*3/uL (ref 150–400)
RDW: 27.1 % — ABNORMAL HIGH (ref 11.5–15.5)
RDW: 26.7 % — ABNORMAL HIGH (ref 11.5–15.5)

## 2012-12-03 LAB — TYPE AND SCREEN
Antibody Screen: NEGATIVE
Unit division: 0

## 2012-12-03 LAB — BASIC METABOLIC PANEL
BUN: 15 mg/dL (ref 6–23)
Calcium: 8 mg/dL — ABNORMAL LOW (ref 8.4–10.5)
Chloride: 91 mEq/L — ABNORMAL LOW (ref 96–112)
Creatinine, Ser: 0.52 mg/dL (ref 0.50–1.10)
GFR calc Af Amer: >90 mL/min (ref >90)

## 2012-12-03 MED ORDER — FLUCONAZOLE 100MG IVPB
100.0000 mg | INTRAVENOUS | Status: DC
  Administered 2012-12-03 – 2012-12-04 (×2): 100 mg via INTRAVENOUS
  Filled 2012-12-03 (×2): qty 50

## 2012-12-03 MED ORDER — NYSTATIN 100000 UNIT/ML MT SUSP
5.0000 mL | Freq: Four times a day (QID) | OROMUCOSAL | Status: DC
  Administered 2012-12-03 – 2012-12-07 (×15): 500000 [IU] via OROMUCOSAL
  Filled 2012-12-03 (×20): qty 5

## 2012-12-03 NOTE — Progress Notes (Signed)
[name] says that she feels better this morning. She feels like she get out of bed more today. There is no nausea. She seemed a little bit better yesterday. She does not have a headache. She feels that her phlebotomy helped her.  She still has drains in. Hopefully they'll be discontinued soon.  She's had no fever. She's had no bleeding. She back on her aspirin and Plavix.  Her vital signs are stable. Blood pressure 151/88. Heart rate is 119. Temperature 98.5. Lungs are clear bilaterally. Cardiac exam regular in rhythm with a 1/6 systolic ejection murmur. Abdomen is soft. Bowel sounds are still quite decreased. Is still tenderness on the right side of her abdomen. Abdominal laparotomy wound is healing well. Her drainage sites look good without erythema or exudate. Extremities shows no clubbing cyanosis or edema. Skin exam no rashes.  Laboratory hemoglobin 6.3. I would probably hold on a phlebotomy for right now since she feels okay. Platelet count 356. White cell count 18.1.  We will continue to follow along. So far, there is no evidence of any neurological issues from her polycythemia. Her being back on aspirin and Plavix I think is the best thing for her.  Pete E.

## 2012-12-03 NOTE — Progress Notes (Signed)
Subjective:  Slightly confused with rambling speech this am. No chest pain or dyspnea. Rhythm sinus tachycardia. Occ PACs.  Objective:  Vital Signs in the last 24 hours: Temp:  [98.1 F (36.7 C)-98.6 F (37 C)] 98.5 F (36.9 C) (09/07 0700) Pulse Rate:  [93-119] 119 (09/07 0550) Resp:  [18-21] 21 (09/07 0550) BP: (141-157)/(69-95) 151/88 mmHg (09/07 0402) SpO2:  [96 %-99 %] 96 % (09/07 0550)  Intake/Output from previous day: 09/06 0701 - 09/07 0700 In: 2175 [I.V.:2175] Out: 936 [Urine:630; Drains:305; Stool:1] Intake/Output from this shift:    . acyclovir ointment   Topical Q3H  . ALPRAZolam  0.5 mg Oral QHS  . aspirin EC  325 mg Oral Daily  . bisoprolol  10 mg Oral QPC breakfast  . cholecalciferol  3,000 Units Oral QPC breakfast  . ciprofloxacin  400 mg Intravenous Q12H  . clopidogrel  75 mg Oral Q breakfast  . cyclobenzaprine  10 mg Oral QHS  . docusate sodium  100 mg Oral TID WC  . enalaprilat  0.625 mg Intravenous Q6H  . estrogens (conjugated)  0.625 mg Oral QPC breakfast  . famotidine  20 mg Oral BID AC  . fentaNYL  25 mcg Transdermal Q72H  . furosemide  40 mg Intravenous Daily  . furosemide  80 mg Oral QPC breakfast  . levETIRAcetam  500 mg Intravenous Q12H  . metoprolol  10 mg Intravenous Q6H  . mineral oil  30 mL Oral QHS  . morphine   Intravenous Q4H  . OxyCODONE  20 mg Oral Custom  . pantoprazole (PROTONIX) IV  40 mg Intravenous Q24H  . potassium chloride  10 mEq Oral QHS  . thyroid  150 mg Oral QPC breakfast  . zolpidem  5 mg Oral QHS   . dextrose 5 % and 0.45 % NaCl with KCl 20 mEq/L 75 mL/hr at 12/02/12 1900    Physical Exam: The patient appears to be in no distress.  Head and neck exam reveals that the pupils are equal and reactive.  The extraocular movements are full.  There is no scleral icterus.  Mouth and pharynx are benign.  No lymphadenopathy.  No carotid bruits.  The jugular venous pressure is normal.  Thyroid is not enlarged or  tender.  Chest is clear to percussion and auscultation.  No rales or rhonchi.  Expansion of the chest is symmetrical.  Heart reveals no abnormal lift or heave.  First and second heart sounds are normal.  There is no  gallop rub or click. Grade 2/6 systolic flow murmur at upper LSE.  The abdomen is soft and nontender.  Bowel sounds are very soft.  There is no hepatosplenomegaly or mass.  There are no abdominal bruits.  Extremities reveal no phlebitis or edema.  Pedal pulses are good.  There is no cyanosis or clubbing.  Neurologic exam is normal strength and no lateralizing weakness.  No sensory deficits.  Integument reveals no rash  Lab Results:  Recent Labs  12/02/12 0440 12/03/12 0614  WBC 15.3* 18.1*  HGB 5.4* 6.3*  PLT 224 356    Recent Labs  12/01/12 0459 12/02/12 0440  NA 134* 134*  K 3.3* 3.3*  CL 98 97  CO2 28 29  GLUCOSE 147* 115*  BUN 6 9  CREATININE 0.52 0.52   No results found for this basename: TROPONINI, CK, MB,  in the last 72 hours Hepatic Function Panel No results found for this basename: PROT, ALBUMIN, AST, ALT, ALKPHOS, BILITOT, BILIDIR, IBILI,  in the last 72 hours No results found for this basename: CHOL,  in the last 72 hours No results found for this basename: PROTIME,  in the last 72 hours  Imaging: No results found.  Cardiac Studies: 2-D echocardiogram dated 10/04/12:  Study Conclusions  - Left ventricle: The cavity size was normal. Wall thickness was increased in a pattern of mild LVH. Systolic function was vigorous. The estimated ejection fraction was in the range of 65% to 70%. Wall motion was normal; there were no regional wall motion abnormalities. Doppler parameters are consistent with abnormal left ventricular relaxation (grade 1 diastolic dysfunction). - Right atrium: The atrium was mildly dilated. - Pulmonary arteries: PA peak pressure: 53mm Hg (S).  Assessment/Plan:  1. Status post cholecystectomy with postoperative nausea  and unable to take her normal oral medication  2. hypertensive cardiovascular disease  3. sinus tachycardia with frequent PACs  Plan: Continue current IV cardiac meds until able to take po meds. Increase activity as tolerated.  LOS: 4 days    [name] 12/03/2012, 7:36 AM

## 2012-12-03 NOTE — Progress Notes (Signed)
Patient ID: [name], female   DOB: October 27, 1945, 67 y.o.   MRN: [id number] 4 Days Post-Op  Subjective: Asked to see the patient soon due to concern for a CVA. Upon arrival the patient is laying comfortably in bed.  Apparently, she was having a very vivid dream and when she woke up she was slightly confused and urinated on herself.  The husband thought she might be having a stroke and alerted the RN.  She currently has no deficits and states she feels normal.  She does not feel like she had a stroke.  Her nausea is actually a little better today. C/o vaginal yeast  Objective: Vital signs in last 24 hours: Temp:  [98.1 F (36.7 C)-98.6 F (37 C)] 98.5 F (36.9 C) (09/07 0700) Pulse Rate:  [93-119] 108 (09/07 0700) Resp:  [16-21] 21 (09/07 0753) BP: (139-157)/(74-95) 139/87 mmHg (09/07 0753) SpO2:  [96 %-99 %] 98 % (09/07 0700)    Intake/Output from previous day: 09/06 0701 - 09/07 0700 In: 2175 [I.V.:2175] Out: 936 [Urine:630; Drains:305; Stool:1] Intake/Output this shift:    PE: General: NAD Heart: tachy, but regular Lungs: CTAB Abd: soft, appropriately tender, +BS, incisions all are c/d/i with staples.  t-tube and JP both with bilious output. Neuro: cranial nerves 2-12 are intact with no deficits.  No facial drooping or slurred speech.  Equal motor function in upper and lower extremities with +4 strength throughout.  Tongue has white present c/w yeast Lab Results:   Recent Labs  12/02/12 0440 12/03/12 0614  WBC 15.3* 18.1*  HGB 5.4* 6.3*  HCT 19.1* 22.6*  PLT 224 356   BMET  Recent Labs  12/01/12 0459 12/02/12 0440  NA 134* 134*  K 3.3* 3.3*  CL 98 97  CO2 28 29  GLUCOSE 147* 115*  BUN 6 9  CREATININE 0.52 0.52  CALCIUM 8.4 8.4   PT/INR No results found for this basename: LABPROT, INR,  in the last 72 hours CMP     Component Value Date/Time   NA 134* 12/02/2012 0440   K 3.3* 12/02/2012 0440   CL 97 12/02/2012 0440   CO2 29 12/02/2012 0440   GLUCOSE 115*  12/02/2012 0440   BUN 9 12/02/2012 0440   CREATININE 0.52 12/02/2012 0440   CALCIUM 8.4 12/02/2012 0440   PROT 6.3 11/21/2012 1500   ALBUMIN 2.8* 11/21/2012 1500   AST 33 11/21/2012 1500   ALT 24 11/21/2012 1500   ALKPHOS 304* 11/21/2012 1500   BILITOT 1.2 11/21/2012 1500   GFRNONAA >90 12/02/2012 0440   GFRAA >90 12/02/2012 0440   Lipase     Component Value Date/Time   LIPASE 23 09/30/2012 0039       Studies/Results: No results found.  Anti-infectives: Anti-infectives   Start     Dose/Rate Route Frequency Ordered Stop   11/29/12 1630  ciprofloxacin (CIPRO) IVPB 400 mg     400 mg 200 mL/hr over 60 Minutes Intravenous Every 12 hours 11/29/12 1621     11/29/12 0600  ciprofloxacin (CIPRO) IVPB 400 mg     400 mg 200 mL/hr over 60 Minutes Intravenous On call to O.R. 11/28/12 1444 11/29/12 0836       Assessment/Plan  1. S/p open cholecystectomy with CBD exploration Patient Active Problem List   Diagnosis Date Noted  . Occlusion and stenosis of carotid artery without mention of cerebral infarction 10/26/2012  . Cholecystostomy tube dysfunction 10/24/2012  . 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 Acute cholecystitis 10/07/2012  .  CVA (cerebral infarction) 10/05/2012  . Septic shock 10/02/2012  . Cholelithiases 09/30/2012  . Unspecified constipation 09/30/2012  . Bronchitis 03/08/2011  . Benign hypertensive heart disease without heart failure 09/28/2010  . Aortic valve sclerosis 09/28/2010  . Hypothyroidism 09/28/2010  . Polycythemia vera(238.4) 09/28/2010  . Postmenopausal state 09/28/2010  . Fibromyalgia 09/28/2010  vaginal and oral candidiasis Leukocytosis  Plan: 1. Will add oral nystatin and diflucan for yeast. 2. WBC increasing, but not obvious signs of cause, but will check UA to rule out yeast UTI given yeast infection.  Patient still has foley though 3. Cont clear liquids today 4. I do not see any evidence of present CVA.  I believe, according to the nurse, that Dr.  Patty Sermons has ordered a CT head.  Not sure that she needs this, but will continue this per his request. 5. Recheck labs in the morning and follow WBC.   LOS: 4 days    Shalise Rosado E 12/03/2012, 9:02 AM Pager: [phone]

## 2012-12-03 NOTE — Progress Notes (Signed)
Patient seen and examined after KO,PA. She is alert and NAD. Nausea has resolved. \  On exam her abd is benign, but JP now filled with bile.  CT head negative for acute injury  Imp: slowly improving  Plan: will allow full liquids. Have asked nurses to empty JP more often

## 2012-12-04 DIAGNOSIS — R4182 Altered mental status, unspecified: Secondary | ICD-10-CM

## 2012-12-04 LAB — CBC
HCT: 17.9 % — ABNORMAL LOW (ref 36.0–46.0)
Hemoglobin: 5 g/dL — CL (ref 12.0–15.0)
MCH: 18.1 pg — ABNORMAL LOW (ref 26.0–34.0)
RBC: 2.76 MIL/uL — ABNORMAL LOW (ref 3.87–5.11)

## 2012-12-04 LAB — COMPREHENSIVE METABOLIC PANEL
ALT: <5 U/L (ref 0–35)
AST: 7 U/L (ref 0–37)
CO2: 26 mEq/L (ref 19–32)
Calcium: 7.8 mg/dL — ABNORMAL LOW (ref 8.4–10.5)
Creatinine, Ser: 0.57 mg/dL (ref 0.50–1.10)
GFR calc Af Amer: >90 mL/min (ref >90)
GFR calc non Af Amer: >90 mL/min (ref >90)
Sodium: 129 mEq/L — ABNORMAL LOW (ref 135–145)
Total Protein: 4.2 g/dL — ABNORMAL LOW (ref 6.0–8.3)

## 2012-12-04 MED ORDER — LEVETIRACETAM 500 MG PO TABS
500.0000 mg | ORAL_TABLET | Freq: Two times a day (BID) | ORAL | Status: DC
  Administered 2012-12-04 – 2012-12-07 (×6): 500 mg via ORAL
  Filled 2012-12-04 (×8): qty 1

## 2012-12-04 MED ORDER — KCL IN DEXTROSE-NACL 20-5-0.45 MEQ/L-%-% IV SOLN
INTRAVENOUS | Status: DC
  Administered 2012-12-04: 22:00:00 via INTRAVENOUS
  Filled 2012-12-04: qty 1000

## 2012-12-04 MED ORDER — TEMAZEPAM 15 MG PO CAPS
30.0000 mg | ORAL_CAPSULE | Freq: Every evening | ORAL | Status: DC | PRN
  Administered 2012-12-04 – 2012-12-05 (×2): 30 mg via ORAL
  Filled 2012-12-04 (×2): qty 2

## 2012-12-04 MED ORDER — FUROSEMIDE 40 MG PO TABS
40.0000 mg | ORAL_TABLET | Freq: Every day | ORAL | Status: DC
  Administered 2012-12-05 – 2012-12-07 (×3): 40 mg via ORAL
  Filled 2012-12-04 (×4): qty 1

## 2012-12-04 MED ORDER — PROMETHAZINE HCL 25 MG RE SUPP: 25.0000 mg | Freq: Two times a day (BID) | RECTAL | Status: DC | PRN

## 2012-12-04 MED ORDER — FLUCONAZOLE 100 MG PO TABS
100.0000 mg | ORAL_TABLET | Freq: Every day | ORAL | Status: DC
  Administered 2012-12-05 – 2012-12-07 (×3): 100 mg via ORAL
  Filled 2012-12-04 (×3): qty 1

## 2012-12-04 MED ORDER — POTASSIUM CHLORIDE CRYS ER 20 MEQ PO TBCR: 80.0000 meq | EXTENDED_RELEASE_TABLET | Freq: Every day | ORAL | Status: DC

## 2012-12-04 MED ORDER — POTASSIUM CHLORIDE CRYS ER 20 MEQ PO TBCR
80.0000 meq | EXTENDED_RELEASE_TABLET | Freq: Every day | ORAL | Status: DC
  Administered 2012-12-04 – 2012-12-06 (×3): 80 meq via ORAL
  Filled 2012-12-04 (×5): qty 4

## 2012-12-04 MED ORDER — OXYCODONE HCL 5 MG PO TABS
7.5000 mg | ORAL_TABLET | ORAL | Status: DC | PRN
  Administered 2012-12-04 – 2012-12-05 (×2): 10 mg via ORAL
  Administered 2012-12-06: 15 mg via ORAL
  Administered 2012-12-07: 10 mg via ORAL
  Filled 2012-12-04: qty 2
  Filled 2012-12-04: qty 3
  Filled 2012-12-04 (×2): qty 2

## 2012-12-04 NOTE — Progress Notes (Signed)
Utilization review completed.  

## 2012-12-04 NOTE — Progress Notes (Signed)
Subjective:  Mentally clearer this am.  No chest pain or dyspnea. BP satisfactory. Rhythm is satisfactory sinus tachycardia. She feels like she will be able to eat better today.  Objective:  Vital Signs in the last 24 hours: Temp:  [97.8 F (36.6 C)-98.8 F (37.1 C)] 98.2 F (36.8 C) (09/08 0712) Pulse Rate:  [89-107] 94 (09/08 0712) Resp:  [10-24] 13 (09/08 0712) BP: (93-132)/(51-95) 130/53 mmHg (09/08 0712) SpO2:  [96 %-99 %] 99 % (09/08 0712)  Intake/Output from previous day: 09/07 0701 - 09/08 0700 In: 2040 [P.O.:240; I.V.:1800] Out: 1775 [Urine:1375; Drains:400] Intake/Output from this shift: Total I/O In: 75 [I.V.:75] Out: 325 [Urine:300; Drains:25]  . acyclovir ointment   Topical Q3H  . ALPRAZolam  0.5 mg Oral QHS  . aspirin EC  325 mg Oral Daily  . bisoprolol  10 mg Oral QPC breakfast  . cholecalciferol  3,000 Units Oral QPC breakfast  . ciprofloxacin  400 mg Intravenous Q12H  . clopidogrel  75 mg Oral Q breakfast  . cyclobenzaprine  10 mg Oral QHS  . docusate sodium  100 mg Oral TID WC  . enalaprilat  0.625 mg Intravenous Q6H  . estrogens (conjugated)  0.625 mg Oral QPC breakfast  . famotidine  20 mg Oral BID AC  . fentaNYL  25 mcg Transdermal Q72H  . fluconazole (DIFLUCAN) IV  100 mg Intravenous Q24H  . furosemide  40 mg Intravenous Daily  . furosemide  80 mg Oral QPC breakfast  . levETIRAcetam  500 mg Intravenous Q12H  . metoprolol  10 mg Intravenous Q6H  . mineral oil  30 mL Oral QHS  . morphine   Intravenous Q4H  . nystatin  5 mL Mouth/Throat QID  . OxyCODONE  20 mg Oral Custom  . pantoprazole (PROTONIX) IV  40 mg Intravenous Q24H  . potassium chloride  10 mEq Oral QHS  . thyroid  150 mg Oral QPC breakfast  . zolpidem  5 mg Oral QHS   . dextrose 5 % and 0.45 % NaCl with KCl 20 mEq/L 75 mL/hr at 12/03/12 1900    Physical Exam: The patient appears to be in no distress.  Head and neck exam reveals that the pupils are equal and reactive.  The  extraocular movements are full.  There is no scleral icterus.  Mouth and pharynx are benign.  No lymphadenopathy.  No carotid bruits.  The jugular venous pressure is normal.  Thyroid is not enlarged or tender.  Chest is clear to percussion and auscultation.  No rales or rhonchi.  Expansion of the chest is symmetrical.  Heart reveals no abnormal lift or heave.  First and second heart sounds are normal.  There is no  gallop rub or click. Grade 2/6 systolic flow murmur at upper LSE.  The abdomen is soft and nontender.  Bowel sounds are improving.  There is no hepatosplenomegaly or mass.  There are no abdominal bruits.  Extremities reveal no phlebitis or edema.  Pedal pulses are good.  There is no cyanosis or clubbing.  Neurologic exam is normal strength and no lateralizing weakness.  No sensory deficits. Mentally clearer.  Integument reveals no rash  Lab Results:  Recent Labs  12/03/12 0835 12/04/12 0445  WBC 18.5* 13.2*  HGB 6.2* 5.0*  PLT 362 284    Recent Labs  12/03/12 0835 12/04/12 0400  NA 127* 129*  K 4.1 3.6  CL 91* 97  CO2 28 26  GLUCOSE 335* 102*  BUN 15 18  CREATININE 0.52 0.57   No results found for this basename: TROPONINI, CK, MB,  in the last 72 hours Hepatic Function Panel  Recent Labs  12/04/12 0400  PROT 4.2*  ALBUMIN 2.0*  AST 7  ALT <5  ALKPHOS 58  BILITOT 0.3   No results found for this basename: CHOL,  in the last 72 hours No results found for this basename: PROTIME,  in the last 72 hours  Imaging: Ct Head Wo Contrast  12/03/2012   *RADIOLOGY REPORT*  Clinical Data: Altered mental status  CT HEAD WITHOUT CONTRAST  Technique:  Contiguous axial images were obtained from the base of the skull through the vertex without contrast.  Comparison: 10/14/2012; brain MRI - 10/16/2012  Findings:  There has been interval evolution of previously noted subcortical infarct within the left parietal lobe, now with associated encephalomalacia.  Grossly unchanged  encephalomalacia involving the white matter about the anterior aspect of the left lateral ventricle.  Mild diffuse atrophy.  Given background parenchymal abnormalities, there is no CT evidence of acute large territory infarct.  No intraparenchymal or extra-axial mass or hemorrhage. Normal size and configuration of the ventricles and basilar cisterns.  No midline shift.  Limited visualization of the paranasal sinuses and mastoid air cells are normal.  Regional soft tissues are normal.  No displaced calvarial fracture.  IMPRESSION:  1.  No acute intracranial process. 2.  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   Original Report Authenticated By: Tacey Ruiz, MD    Cardiac Studies: 2-D echocardiogram dated 10/04/12:  Study Conclusions  - Left ventricle: The cavity size was normal. Wall thickness was increased in a pattern of mild LVH. Systolic function was vigorous. The estimated ejection fraction was in the range of 65% to 70%. Wall motion was normal; there were no regional wall motion abnormalities. Doppler parameters are consistent with abnormal left ventricular relaxation (grade 1 diastolic dysfunction). - Right atrium: The atrium was mildly dilated. - Pulmonary arteries: PA peak pressure: 53mm Hg (S).  Assessment/Plan:  1. Status post cholecystectomy with postoperative nausea and unable to take her normal oral medication  2. hypertensive cardiovascular disease  3. sinus tachycardia resolving.  Plan: She may be able to transition to her usual home BP meds today. Agree with Dr. [name] she can probably transfer to a regular bed today.   LOS: 5 days    [name] 12/04/2012, 8:05 AM

## 2012-12-04 NOTE — Progress Notes (Signed)
[name] had a tough day yesterday. She has mental status changes. She still seems a little confused. CT of the brain done last night was unremarkable for any new CNS process. She continues on the aspirin and Plavix.   She did not get of bed. There is still some pain issues.  Is no nausea. No bowel movement yet. I think she's taking clear or full liquids.  No headache is noted.   Her vital signs are stable. Blood pressure is 121/62. Temperature is 98. Heart rate is 90. Her lungs are clear. Cardiac exam regular rate and rhythm. She is a chronic 1/6 systolic ejection murmur. Abdomen soft. Bowel sounds seem a little more active. There is still tenderness on the right side. Extremities shows no clubbing cyanosis or edema.  Laboratory White cell count 13.2 hemoglobin 5 hematocrit 17.9 and platelet count is 284. Her sodium 129 potassium 3.6. Albumin 2.  She will do well at a hemoglobin of 5. I would not transfuse her. It does not take much for her hyperviscosity to cause complications. As long as she is hemodynamically stable, I would not transfuse her.  The altered mental state may be medication related. I wonder if she would improve if she were out of the step down unit and a regular bed.  We will continue to follow closely.  Hewitt Shorts

## 2012-12-04 NOTE — Progress Notes (Addendum)
GS Progress Note Subjective: Patient doing much better today.  No nausea.  No vomiting.  Phenergan causes her to have weird dreams.    Wants to eat more today.  Objective: Vital signs in last 24 hours: Temp:  [97.8 F (36.6 C)-98.8 F (37.1 C)] 98.2 F (36.8 C) (09/08 0712) Pulse Rate:  [89-107] 94 (09/08 0712) Resp:  [10-24] 13 (09/08 0712) BP: (93-132)/(51-95) 130/53 mmHg (09/08 0712) SpO2:  [96 %-99 %] 99 % (09/08 0712)    Intake/Output from previous day: 09/07 0701 - 09/08 0700 In: 2040 [P.O.:240; I.V.:1800] Out: 1775 [Urine:1375; Drains:400] Intake/Output this shift: Total I/O In: 75 [I.V.:75] Out: 325 [Urine:300; Drains:25]  Lungs: Clear  Abd: Hypoactive but present bowel sounds.  Extremities: No clinical signs of DVT  Neuro: Intact  Lab Results: CBC   Recent Labs  12/03/12 0835 12/04/12 0445  WBC 18.5* 13.2*  HGB 6.2* 5.0*  HCT 22.2* 17.9*  PLT 362 284   BMET  Recent Labs  12/03/12 0835 12/04/12 0400  NA 127* 129*  K 4.1 3.6  CL 91* 97  CO2 28 26  GLUCOSE 335* 102*  BUN 15 18  CREATININE 0.52 0.57  CALCIUM 8.0* 7.8*   PT/INR No results found for this basename: LABPROT, INR,  in the last 72 hours ABG No results found for this basename: PHART, PCO2, PO2, HCO3,  in the last 72 hours  Studies/Results: Ct Head Wo Contrast  12/03/2012   *RADIOLOGY REPORT*  Clinical Data: Altered mental status  CT HEAD WITHOUT CONTRAST  Technique:  Contiguous axial images were obtained from the base of the skull through the vertex without contrast.  Comparison: 10/14/2012; brain MRI - 10/16/2012  Findings:  There has been interval evolution of previously noted subcortical infarct within the left parietal lobe, now with associated encephalomalacia.  Grossly unchanged encephalomalacia involving the white matter about the anterior aspect of the left lateral ventricle.  Mild diffuse atrophy.  Given background parenchymal abnormalities, there is no CT evidence of acute  large territory infarct.  No intraparenchymal or extra-axial mass or hemorrhage. Normal size and configuration of the ventricles and basilar cisterns.  No midline shift.  Limited visualization of the paranasal sinuses and mastoid air cells are normal.  Regional soft tissues are normal.  No displaced calvarial fracture.  IMPRESSION:  1.  No acute intracranial process. 2.  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   Original Report Authenticated By: Tacey Ruiz, MD    Anti-infectives: Anti-infectives   Start     Dose/Rate Route Frequency Ordered Stop   12/03/12 1030  fluconazole (DIFLUCAN) IVPB 100 mg     100 mg 50 mL/hr over 60 Minutes Intravenous Every 24 hours 12/03/12 0913     11/29/12 1630  ciprofloxacin (CIPRO) IVPB 400 mg     400 mg 200 mL/hr over 60 Minutes Intravenous Every 12 hours 11/29/12 1621     11/29/12 0600  ciprofloxacin (CIPRO) IVPB 400 mg     400 mg 200 mL/hr over 60 Minutes Intravenous On call to O.R. 11/28/12 1444 11/29/12 0836      Assessment/Plan: s/p Procedure(s): ATTEMPTED LAPAROSCOPIC CHOLECYSTECTOMY  CHOLECYSTECTOMY, COMMON BILE DUCT EXPLORATION, T-TUBE PLACEMENT d/c foley Advance diet Get patient back on most of her PO BP meds and pain medications.  LOS: 5 days    Marta Lamas. Gae Bon, MD, FACS [phone] [facility] 12/04/2012   Bile bag draining primarily, but mostly serosanguinous fluid

## 2012-12-04 NOTE — Progress Notes (Signed)
Foley removed at 1000 per MD order. Pt tolerated procedure well. Will continue to monitor.

## 2012-12-05 LAB — BASIC METABOLIC PANEL
BUN: 9 mg/dL (ref 6–23)
CO2: 28 mEq/L (ref 19–32)
Calcium: 8.2 mg/dL — ABNORMAL LOW (ref 8.4–10.5)
Chloride: 93 mEq/L — ABNORMAL LOW (ref 96–112)
Creatinine, Ser: 0.63 mg/dL (ref 0.50–1.10)
Glucose, Bld: 100 mg/dL — ABNORMAL HIGH (ref 70–99)

## 2012-12-05 LAB — CBC WITH DIFFERENTIAL/PLATELET
Eosinophils Relative: 2 % (ref 0–5)
HCT: 17.9 % — ABNORMAL LOW (ref 36.0–46.0)
Lymphs Abs: 1.8 10*3/uL (ref 0.7–4.0)
MCH: 18.3 pg — ABNORMAL LOW (ref 26.0–34.0)
MCV: 64.2 fL — ABNORMAL LOW (ref 78.0–100.0)
Monocytes Absolute: 1.6 10*3/uL — ABNORMAL HIGH (ref 0.1–1.0)
Monocytes Relative: 12 % (ref 3–12)
Neutro Abs: 9.3 10*3/uL — ABNORMAL HIGH (ref 1.7–7.7)
RBC: 2.79 MIL/uL — ABNORMAL LOW (ref 3.87–5.11)
WBC: 13 10*3/uL — ABNORMAL HIGH (ref 4.0–10.5)

## 2012-12-05 MED ORDER — SODIUM CHLORIDE 0.9 % IJ SOLN: 3.0000 mL | INTRAMUSCULAR | Status: DC | PRN

## 2012-12-05 NOTE — Progress Notes (Signed)
Subjective:  Became confused at about 4 a.m. After having a good day yesterday. Eating well. Now back on her home oral meds. No chest pain or dyspnea.  Objective:  Vital Signs in the last 24 hours: Temp:  [97.9 F (36.6 C)-98.7 F (37.1 C)] 98.6 F (37 C) (09/09 0300) Pulse Rate:  [91-101] 93 (09/09 0350) Resp:  [11-16] 14 (09/09 0350) BP: (113-120)/(57-72) 113/57 mmHg (09/09 0350) SpO2:  [95 %-99 %] 98 % (09/09 0350)  Intake/Output from previous day: 09/08 0701 - 09/09 0700 In: 1910 [P.O.:1320; I.V.:590] Out: 1160 [Urine:700; Drains:460] Intake/Output from this shift:    . acyclovir ointment   Topical Q3H  . ALPRAZolam  0.5 mg Oral QHS  . aspirin EC  325 mg Oral Daily  . bisoprolol  10 mg Oral QPC breakfast  . cholecalciferol  3,000 Units Oral QPC breakfast  . clopidogrel  75 mg Oral Q breakfast  . cyclobenzaprine  10 mg Oral QHS  . docusate sodium  100 mg Oral TID WC  . estrogens (conjugated)  0.625 mg Oral QPC breakfast  . famotidine  20 mg Oral BID AC  . fluconazole  100 mg Oral Daily  . furosemide  40 mg Oral QPC breakfast  . levETIRAcetam  500 mg Oral BID AC & HS  . mineral oil  30 mL Oral QHS  . nystatin  5 mL Mouth/Throat QID  . OxyCODONE  20 mg Oral Custom  . potassium chloride  80 mEq Oral QHS  . thyroid  150 mg Oral QPC breakfast   . dextrose 5 % and 0.45 % NaCl with KCl 20 mEq/L 20 mL/hr at 12/04/12 2214    Physical Exam: The patient appears to be in no distress.  Head and neck exam reveals that the pupils are equal and reactive.  The extraocular movements are full.  There is no scleral icterus.  Mouth and pharynx are benign.  No lymphadenopathy.  No carotid bruits.  The jugular venous pressure is normal.  Thyroid is not enlarged or tender.  Chest is clear to percussion and auscultation.  No rales or rhonchi.  Expansion of the chest is symmetrical.  Heart reveals no abnormal lift or heave.  First and second heart sounds are normal.  There is no   gallop rub or click. Grade 2/6 systolic flow murmur at upper LSE.  The abdomen is soft and nontender.  Bowel sounds are improving.  There is no hepatosplenomegaly or mass.  There are no abdominal bruits.  Extremities reveal no phlebitis or edema.  Pedal pulses are good.  There is no cyanosis or clubbing.  Neurologic exam is normal strength and no lateralizing weakness.  No sensory deficits. MIldly confused again this am.  Integument reveals no rash  Lab Results:  Recent Labs  12/04/12 0445 12/05/12 0425  WBC 13.2* 13.0*  HGB 5.0* 5.1*  PLT 284 257    Recent Labs  12/03/12 0835 12/04/12 0400  NA 127* 129*  K 4.1 3.6  CL 91* 97  CO2 28 26  GLUCOSE 335* 102*  BUN 15 18  CREATININE 0.52 0.57   No results found for this basename: TROPONINI, CK, MB,  in the last 72 hours Hepatic Function Panel  Recent Labs  12/04/12 0400  PROT 4.2*  ALBUMIN 2.0*  AST 7  ALT <5  ALKPHOS 58  BILITOT 0.3   No results found for this basename: CHOL,  in the last 72 hours No results found for this basename: PROTIME,  in the last 72 hours  Imaging: Ct Head Wo Contrast  12/03/2012   *RADIOLOGY REPORT*  Clinical Data: Altered mental status  CT HEAD WITHOUT CONTRAST  Technique:  Contiguous axial images were obtained from the base of the skull through the vertex without contrast.  Comparison: 10/14/2012; brain MRI - 10/16/2012  Findings:  There has been interval evolution of previously noted subcortical infarct within the left parietal lobe, now with associated encephalomalacia.  Grossly unchanged encephalomalacia involving the white matter about the anterior aspect of the left lateral ventricle.  Mild diffuse atrophy.  Given background parenchymal abnormalities, there is no CT evidence of acute large territory infarct.  No intraparenchymal or extra-axial mass or hemorrhage. Normal size and configuration of the ventricles and basilar cisterns.  No midline shift.  Limited visualization of the  paranasal sinuses and mastoid air cells are normal.  Regional soft tissues are normal.  No displaced calvarial fracture.  IMPRESSION:  1.  No acute intracranial process. 2.  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   Original Report Authenticated By: Tacey Ruiz, MD    Cardiac Studies: 2-D echocardiogram dated 10/04/12:  Study Conclusions  - Left ventricle: The cavity size was normal. Wall thickness was increased in a pattern of mild LVH. Systolic function was vigorous. The estimated ejection fraction was in the range of 65% to 70%. Wall motion was normal; there were no regional wall motion abnormalities. Doppler parameters are consistent with abnormal left ventricular relaxation (grade 1 diastolic dysfunction). - Right atrium: The atrium was mildly dilated. - Pulmonary arteries: PA peak pressure: 53mm Hg (S).  Assessment/Plan:  1. Status post cholecystectomy. 2. hypertensive cardiovascular disease  3. sinus tachycardia resolving. 4. Confusion probably secondary to pain meds and being in ICU.   Plan: Transfer to regular bed today.   LOS: 6 days    [name] 12/05/2012, 8:02 AM

## 2012-12-05 NOTE — Progress Notes (Signed)
Physical Therapy Evaluation Patient Details Name: [name] MRN: [id number] DOB: 04-02-1945 Today's Date: 12/05/2012 Time: 9147-8295 PT Time Calculation (min): 43 min  PT Assessment / Plan / Recommendation History of Present Illness  [name] is a 67 y.o. female with Past medical history of polycythemia vera, hypertension, fibromyalgia, hypothyroidism who was recently admitted for acute cholecystitis and had an acute watershed stroke as well, now coming in with 9 episode of recurrent seizures.Marland Kitchen RUE weakness with distal greater than proximal. Due to polychethemia vera Hbg goal is 6  Clinical Impression  Pt admitted with above. Pt currently with functional limitations due to the deficits listed below (see PT Problem List). Pt husband states he can take pt home as long as she is not confused and walking fairly Modif I with RW or at least min guard asssit. Currently she is still confused.   Will need full HH support.  Pt will benefit from skilled PT to increase their independence and safety with mobility to allow discharge to the venue listed below.     PT Assessment  Patient needs continued PT services    Follow Up Recommendations  Home health PT;Supervision/Assistance - 24 hour (husband states he can only take her home if confusion clears)                Equipment Recommendations  None recommended by PT         Frequency Min 3X/week    Precautions / Restrictions Precautions Precautions: Fall Restrictions Weight Bearing Restrictions: No   Pertinent Vitals/Pain VSS, no pain      Mobility  Bed Mobility Bed Mobility: Sit to Supine Sit to Supine: 4: Min assist Details for Bed Mobility Assistance: min asssit for LEs Transfers Transfers: Sit to Stand;Stand to Sit Sit to Stand: 4: Min guard;With upper extremity assist;From bed Stand to Sit: 4: Min guard;With upper extremity assist;To bed Details for Transfer Assistance: cues for hand  placement Ambulation/Gait Ambulation/Gait Assistance: 4: Min assist Ambulation Distance (Feet): 225 Feet (75 feet x 3 with sitting rest breaks) Assistive device: Rolling walker Ambulation/Gait Assistance Details: Husband followed pt with wheelchair so she could rest prn.  Pt needed cues for postural stability as she tends to lean forward.  Pt needed assist to steer RW as well.   Pt also needed cues to stay close to RW as well.   Gait Pattern: Step-to pattern;Wide base of support;Trunk flexed;Antalgic;Shuffle;Decreased stride length Gait velocity: decreased Stairs: No Wheelchair Mobility Wheelchair Mobility: No         PT Diagnosis: Generalized weakness  PT Problem List: Decreased activity tolerance;Decreased balance;Decreased mobility;Decreased knowledge of use of DME;Decreased safety awareness;Decreased knowledge of precautions PT Treatment Interventions: DME instruction;Gait training;Functional mobility training;Therapeutic activities;Therapeutic exercise;Balance training;Cognitive remediation;Patient/family education     PT Goals(Current goals can be found in the care plan section) Acute Rehab PT Goals Patient Stated Goal: to go home per husband PT Goal Formulation: With patient/family Time For Goal Achievement: 12/19/12 Potential to Achieve Goals: Good  Visit Information  Last PT Received On: 12/05/12 Assistance Needed: +1 History of Present Illness: [name] is a 67 y.o. female with Past medical history of polycythemia vera, hypertension, fibromyalgia, hypothyroidism who was recently admitted for acute cholecystitis and had an acute watershed stroke as well, now coming in with 9 episode of recurrent seizures.Marland Kitchen RUE weakness with distal greater than proximal. Due to polychethemia vera Hbg goal is 6       Prior Functioning  Home Living Family/patient expects to  be discharged to:: Private residence Living Arrangements: Spouse/significant other Available Help at Discharge:  Family;Available 24 hours/day Type of Home: House Home Access: Level entry Home Layout: One level Home Equipment:  (HH shower, Bath sponge, long handled shoe horn) Additional Comments: pt uses rollator walker for long distance mobility and cane for short distance.   Prior Function Level of Independence: Independent Comments: Per husband ambulates independently in home Communication Communication: Expressive difficulties Dominant Hand: Right    Cognition  Cognition Arousal/Alertness: Awake/alert Behavior During Therapy: WFL for tasks assessed/performed Overall Cognitive Status: Impaired/Different from baseline Area of Impairment: Orientation;Memory;Following commands;Safety/judgement;Awareness;Problem solving Orientation Level: Disoriented to;Place;Time;Situation Memory: Decreased short-term memory Following Commands: Follows one step commands inconsistently;Follows one step commands with increased time Safety/Judgement: Decreased awareness of deficits;Decreased awareness of safety Problem Solving: Slow processing;Decreased initiation;Difficulty sequencing;Requires verbal cues;Requires tactile cues General Comments: Confused    Extremity/Trunk Assessment Upper Extremity Assessment Upper Extremity Assessment: Defer to OT evaluation Lower Extremity Assessment Lower Extremity Assessment: Generalized weakness   Balance Balance Balance Assessed: Yes Static Standing Balance Static Standing - Balance Support: Bilateral upper extremity supported;During functional activity Static Standing - Level of Assistance: 4: Min assist Static Standing - Comment/# of Minutes: 3  End of Session PT - End of Session Equipment Utilized During Treatment: Gait belt Activity Tolerance: Patient limited by fatigue Patient left: in bed;with call bell/phone within reach;with family/visitor present Nurse Communication: Mobility status       INGOLD,Salmaan Patchin 12/05/2012, 10:25 AM [name] Acute  Rehabilitation [phone] (pager)

## 2012-12-05 NOTE — Progress Notes (Signed)
Report called to Teresa Coombs, receiving RN on  6 north. VSS. Transferred to 7W29 via wheelchair with personal belongings. Husband at bedside.  [facility]

## 2012-12-05 NOTE — Progress Notes (Signed)
GS Progress Note Subjective: Patient is doing well, but seems a bit confused.  Angry at her husband.  Objective: Vital signs in last 24 hours: Temp:  [97.9 F (36.6 C)-98.7 F (37.1 C)] 98.6 F (37 C) (09/09 0759) Pulse Rate:  [91-108] 108 (09/09 0759) Resp:  [11-16] 16 (09/09 0759) BP: (113-120)/(57-72) 114/64 mmHg (09/09 0759) SpO2:  [93 %-99 %] 93 % (09/09 0759)    Intake/Output from previous day: 09/08 0701 - 09/09 0700 In: 1910 [P.O.:1320; I.V.:590] Out: 1160 [Urine:700; Drains:460] Intake/Output this shift: Total I/O In: 20 [I.V.:20] Out: 90 [Drains:90]  Lungs: Clear  Abd: Benign.  Most of bile is coming out of T-tube now.  Less to none out of Blake drain.  Extremities: No changes  Neuro: Intact, but maybe confused.  Patient had an episode of confusion this morning prior to my arrival.  Lab Results: CBC   Recent Labs  12/04/12 0445 12/05/12 0425  WBC 13.2* 13.0*  HGB 5.0* 5.1*  HCT 17.9* 17.9*  PLT 284 257   BMET  Recent Labs  12/03/12 0835 12/04/12 0400  NA 127* 129*  K 4.1 3.6  CL 91* 97  CO2 28 26  GLUCOSE 335* 102*  BUN 15 18  CREATININE 0.52 0.57  CALCIUM 8.0* 7.8*   PT/INR No results found for this basename: LABPROT, INR,  in the last 72 hours ABG No results found for this basename: PHART, PCO2, PO2, HCO3,  in the last 72 hours  Studies/Results: Ct Head Wo Contrast  12/03/2012   *RADIOLOGY REPORT*  Clinical Data: Altered mental status  CT HEAD WITHOUT CONTRAST  Technique:  Contiguous axial images were obtained from the base of the skull through the vertex without contrast.  Comparison: 10/14/2012; brain MRI - 10/16/2012  Findings:  There has been interval evolution of previously noted subcortical infarct within the left parietal lobe, now with associated encephalomalacia.  Grossly unchanged encephalomalacia involving the white matter about the anterior aspect of the left lateral ventricle.  Mild diffuse atrophy.  Given background  parenchymal abnormalities, there is no CT evidence of acute large territory infarct.  No intraparenchymal or extra-axial mass or hemorrhage. Normal size and configuration of the ventricles and basilar cisterns.  No midline shift.  Limited visualization of the paranasal sinuses and mastoid air cells are normal.  Regional soft tissues are normal.  No displaced calvarial fracture.  IMPRESSION:  1.  No acute intracranial process. 2.  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   Original Report Authenticated By: Tacey Ruiz, MD    Anti-infectives: Anti-infectives   Start     Dose/Rate Route Frequency Ordered Stop   12/05/12 1000  fluconazole (DIFLUCAN) tablet 100 mg     100 mg Oral Daily 12/04/12 2108     12/03/12 1030  fluconazole (DIFLUCAN) IVPB 100 mg  Status:  Discontinued     100 mg 50 mL/hr over 60 Minutes Intravenous Every 24 hours 12/03/12 0913 12/04/12 2111   11/29/12 1630  ciprofloxacin (CIPRO) IVPB 400 mg  Status:  Discontinued     400 mg 200 mL/hr over 60 Minutes Intravenous Every 12 hours 11/29/12 1621 12/04/12 1449   11/29/12 0600  ciprofloxacin (CIPRO) IVPB 400 mg     400 mg 200 mL/hr over 60 Minutes Intravenous On call to O.R. 11/28/12 1444 11/29/12 0836      Assessment/Plan: s/p Procedure(s): ATTEMPTED LAPAROSCOPIC CHOLECYSTECTOMY  CHOLECYSTECTOMY, COMMON BILE DUCT EXPLORATION, T-TUBE PLACEMENT Advance diet Should be tranferred out today. Recheck  labs tomorrow Discontinue fentanyl patch.  LOS: 6 days    Marta Lamas. Gae Bon, MD, FACS [phone] [facility] 12/05/2012

## 2012-12-06 ENCOUNTER — Other Ambulatory Visit: Payer: Medicare Other | Admitting: Lab

## 2012-12-06 ENCOUNTER — Ambulatory Visit: Payer: Medicare Other | Admitting: Hematology & Oncology

## 2012-12-06 LAB — CBC WITH DIFFERENTIAL/PLATELET
Basophils Absolute: 0 10*3/uL (ref 0.0–0.1)
Basophils Absolute: 0 10*3/uL (ref 0.0–0.1)
Eosinophils Absolute: 0.3 10*3/uL (ref 0.0–0.7)
HCT: 17.7 % — ABNORMAL LOW (ref 36.0–46.0)
HCT: 19 % — ABNORMAL LOW (ref 36.0–46.0)
Lymphocytes Relative: 13 % (ref 12–46)
Lymphs Abs: 1.9 10*3/uL (ref 0.7–4.0)
Lymphs Abs: 1.6 10*3/uL (ref 0.7–4.0)
MCH: 17.8 pg — ABNORMAL LOW (ref 26.0–34.0)
MCHC: 27.7 g/dL — ABNORMAL LOW (ref 30.0–36.0)
MCHC: 27.4 g/dL — ABNORMAL LOW (ref 30.0–36.0)
MCV: 64.4 fL — ABNORMAL LOW (ref 78.0–100.0)
MCV: 64.4 fL — ABNORMAL LOW (ref 78.0–100.0)
Monocytes Absolute: 1.3 10*3/uL — ABNORMAL HIGH (ref 0.1–1.0)
Monocytes Relative: 12 % (ref 3–12)
Neutro Abs: 7 10*3/uL (ref 1.7–7.7)
Neutro Abs: 9.4 10*3/uL — ABNORMAL HIGH (ref 1.7–7.7)
RDW: 25.5 % — ABNORMAL HIGH (ref 11.5–15.5)
RDW: 25.4 % — ABNORMAL HIGH (ref 11.5–15.5)

## 2012-12-06 LAB — BASIC METABOLIC PANEL
BUN: 9 mg/dL (ref 6–23)
Calcium: 7.9 mg/dL — ABNORMAL LOW (ref 8.4–10.5)
Chloride: 96 mEq/L (ref 96–112)
Creatinine, Ser: 0.74 mg/dL (ref 0.50–1.10)
GFR calc Af Amer: >90 mL/min (ref >90)

## 2012-12-06 MED ORDER — SODIUM CHLORIDE 0.9 % IJ SOLN
10.0000 mL | INTRAMUSCULAR | Status: DC | PRN
  Administered 2012-12-06: 20 mL

## 2012-12-06 MED ORDER — ALTEPLASE 2 MG IJ SOLR
2.0000 mg | Freq: Once | INTRAMUSCULAR | Status: AC
Start: 2012-12-06 — End: 2012-12-06
  Administered 2012-12-06: 2 mg
  Filled 2012-12-06: qty 2

## 2012-12-06 MED ORDER — ALTEPLASE 2 MG IJ SOLR
2.0000 mg | Freq: Once | INTRAMUSCULAR | Status: AC
Start: 2012-12-06 — End: 2012-12-06
  Administered 2012-12-06: 2 mg
  Filled 2012-12-06 (×3): qty 2

## 2012-12-06 MED ORDER — SENNA 8.6 MG PO TABS
2.0000 | ORAL_TABLET | Freq: Three times a day (TID) | ORAL | Status: DC
  Administered 2012-12-06 – 2012-12-07 (×4): 17.2 mg via ORAL
  Filled 2012-12-06: qty 2
  Filled 2012-12-06 (×2): qty 1
  Filled 2012-12-06: qty 2
  Filled 2012-12-06: qty 1
  Filled 2012-12-06 (×2): qty 2
  Filled 2012-12-06: qty 1

## 2012-12-06 MED ORDER — SODIUM CHLORIDE 0.9 % IJ SOLN: 10.0000 mL | Freq: Two times a day (BID) | INTRAMUSCULAR | Status: DC

## 2012-12-06 NOTE — Progress Notes (Signed)
[name] is doing better. She is more alert. She is more oriented. I suppose a fentanyl patch could have been the culprit. This has been discontinued.  She does seems stronger. Seemed to be better. Is getting out of bed more.  Is no headache. There is no shortness of breath. She's had no nausea vomiting.  She still has her drainage tubes in. Hopefully these will be discontinued at some point.  97.9 87 14 118/60  Better bowel sounds.  Not as tender in abdomen.Lungs are clear.  Heart is RRR.  1/6 murmur. Extremities are w/o CCE.  Neuro is non-focal.  Awaiting labs.  Improving post cholecystectomy.  No hematological issues right now.    ??D/C home by weekend??  Cindee Lame

## 2012-12-06 NOTE — Progress Notes (Signed)
GS Progress Note Subjective: Patient looks good and feels good this AM.  Hgb 4.9   Objective: Vital signs in last 24 hours: Temp:  [97.4 F (36.3 C)-98.1 F (36.7 C)] 97.9 F (36.6 C) (09/10 0615) Pulse Rate:  [79-87] 87 (09/10 0615) Resp:  [16-20] 18 (09/10 0615) BP: (109-127)/(60-70) 118/60 mmHg (09/10 0615) SpO2:  [96 %-100 %] 96 % (09/10 0615) Last BM Date:  (per patient lbm=9/6)  Intake/Output from previous day: 09/09 0701 - 09/10 0700 In: 20 [I.V.:20] Out: 590 [Drains:590] Intake/Output this shift:    Lungs: Clear  Abd: Soft, good bowel sounds.  Bile apprpriately draining from T-tube.  400-500 cc per day.  Patient eating well.  Extremities: No DVT signs or symptoms  Neuro: Intact  Lab Results: CBC   Recent Labs  12/05/12 0425 12/06/12 0610  WBC 13.0* 10.5  HGB 5.1* 4.9*  HCT 17.9* 17.7*  PLT 257 246   BMET  Recent Labs  12/05/12 0900 12/06/12 0610  NA 128* 131*  K 4.1 4.5  CL 93* 96  CO2 28 29  GLUCOSE 100* 101*  BUN 9 9  CREATININE 0.63 0.74  CALCIUM 8.2* 7.9*   PT/INR No results found for this basename: LABPROT, INR,  in the last 72 hours ABG No results found for this basename: PHART, PCO2, PO2, HCO3,  in the last 72 hours  Studies/Results: No results found.  Anti-infectives: Anti-infectives   Start     Dose/Rate Route Frequency Ordered Stop   12/05/12 1000  fluconazole (DIFLUCAN) tablet 100 mg     100 mg Oral Daily 12/04/12 2108     12/03/12 1030  fluconazole (DIFLUCAN) IVPB 100 mg  Status:  Discontinued     100 mg 50 mL/hr over 60 Minutes Intravenous Every 24 hours 12/03/12 0913 12/04/12 2111   11/29/12 1630  ciprofloxacin (CIPRO) IVPB 400 mg  Status:  Discontinued     400 mg 200 mL/hr over 60 Minutes Intravenous Every 12 hours 11/29/12 1621 12/04/12 1449   11/29/12 0600  ciprofloxacin (CIPRO) IVPB 400 mg     400 mg 200 mL/hr over 60 Minutes Intravenous On call to O.R. 11/28/12 1444 11/29/12 0836      Assessment/Plan: s/p  Procedure(s): ATTEMPTED LAPAROSCOPIC CHOLECYSTECTOMY  CHOLECYSTECTOMY, COMMON BILE DUCT EXPLORATION, T-TUBE PLACEMENT Advance diet Plan for discharge tomorrow Will keep both drains open for the next two weeks. Probably remove staples tomorrow AM  LOS: 7 days    Marta Lamas. Gae Bon, MD, FACS [phone] [facility] 12/06/2012

## 2012-12-06 NOTE — Progress Notes (Signed)
Physical Therapy Treatment Patient Details Name: [name] MRN: [id number] DOB: 1945/08/13 Today's Date: 12/06/2012 Time: 4098-1191 PT Time Calculation (min): 33 min  PT Assessment / Plan / Recommendation  History of Present Illness [name] is a 68 y.o. female with Past medical history of polycythemia vera, hypertension, fibromyalgia, hypothyroidism who was recently admitted for acute cholecystitis and had an acute watershed stroke as well, now coming in with 9 episode of recurrent seizures.Marland Kitchen RUE weakness with distal greater than proximal. Due to polychethemia vera Hbg goal is 6   PT Comments   Pt is progressing with ability to mobilize and did not seem as confused today, however, she did not progress with ambulation distance due to extreme fatigue. At this point, at her min A level, husband feels that he can care for her at home and neighbors help as well. Recommend HHPT and HHRN for mgmt of PICC line, at request of husband. PT will continue to follow.   Follow Up Recommendations  Home health PT;Supervision/Assistance - 24 hour [facility] for PICC)     Does the patient have the potential to tolerate intense rehabilitation     Barriers to Discharge        Equipment Recommendations  None recommended by PT    Recommendations for Other Services    Frequency Min 3X/week   Progress towards PT Goals Progress towards PT goals: Progressing toward goals  Plan Current plan remains appropriate    Precautions / Restrictions Precautions Precautions: Fall Precaution Comments: 2 drains Restrictions Weight Bearing Restrictions: No   Pertinent Vitals/Pain Faces 6/10 abdominal pain, repositioned multiple times    Mobility  Bed Mobility Bed Mobility: Rolling Left;Left Sidelying to Sit;Sit to Supine;Sitting - Scoot to Edge of Bed Rolling Left: 4: Min assist Left Sidelying to Sit: 3: Mod assist Sitting - Scoot to Edge of Bed: 3: Mod assist Sit to Supine: 3: Mod assist Details for Bed  Mobility Assistance: pt needed more assist today due to lethargy. Bed mobility done from flat bed to practice how it will be at home. Pt did well following commands and initiating mvmt but all mvmts slow. Mod A at trunk for SL to sit and then at legs for back into bed Transfers Transfers: Sit to Stand;Stand to Sit Sit to Stand: 4: Min assist;From bed;From toilet Stand to Sit: 4: Min assist;To toilet;To bed Details for Transfer Assistance: min A for power up Ambulation/Gait Ambulation/Gait Assistance: 4: Min assist Ambulation Distance (Feet): 24 Feet (12' x 2) Assistive device: Rolling walker Ambulation/Gait Assistance Details: pt could not tolerate distance today that she did yesterday due to fatigue. vc's to stay within RW and she needed occasional manual redirection Gait Pattern: Step-to pattern;Trunk flexed Gait velocity: decreased Stairs: No Wheelchair Mobility Wheelchair Mobility: No Modified Rankin (Stroke Patients Only) Pre-Morbid Rankin Score: Moderate disability Modified Rankin: Moderately severe disability    Exercises     PT Diagnosis:    PT Problem List:   PT Treatment Interventions:     PT Goals (current goals can now be found in the care plan section) Acute Rehab PT Goals Patient Stated Goal: to go home per husband PT Goal Formulation: With patient/family Time For Goal Achievement: 12/19/12 Potential to Achieve Goals: Good  Visit Information  Last PT Received On: 12/06/12 Assistance Needed: +1 History of Present Illness: [name] is a 67 y.o. female with Past medical history of polycythemia vera, hypertension, fibromyalgia, hypothyroidism who was recently admitted for acute cholecystitis and had an acute  watershed stroke as well, now coming in with 9 episode of recurrent seizures.Marland Kitchen RUE weakness with distal greater than proximal. Due to polychethemia vera Hbg goal is 6    Subjective Data  Subjective: pt reports that she is sleepy Patient Stated Goal: to go  home per husband   Cognition  Cognition Arousal/Alertness: Lethargic Behavior During Therapy: Flat affect Overall Cognitive Status: Impaired/Different from baseline Area of Impairment: Orientation;Memory;Following commands;Safety/judgement;Awareness;Problem solving Orientation Level: Disoriented to;Time Memory: Decreased short-term memory Following Commands: Follows one step commands consistently;Follows one step commands with increased time Safety/Judgement: Decreased awareness of deficits;Decreased awareness of safety Problem Solving: Slow processing;Decreased initiation;Difficulty sequencing;Requires verbal cues;Requires tactile cues    Balance  Balance Balance Assessed: Yes Dynamic Standing Balance Dynamic Standing - Balance Support: Left upper extremity supported;During functional activity Dynamic Standing - Level of Assistance: 5: Stand by assistance Dynamic Standing - Comments: maintained standing x 7 mins at sink, needs UE support and legs became fatigue but pt was able to complete unilateral task  End of Session PT - End of Session Equipment Utilized During Treatment: Gait belt Activity Tolerance: Patient limited by fatigue Patient left: in bed;with call bell/phone within reach;with family/visitor present Nurse Communication: Mobility status   GP   [name], PT  Acute Rehab Services  [phone]   [name] 12/06/2012, 4:19 PM

## 2012-12-06 NOTE — Progress Notes (Signed)
Subjective:  Confusion has cleared off fentanyl patch and transferring to a quiet room. No cardiac complaints today. Eating.  Objective:  Vital Signs in the last 24 hours: Temp:  [97.4 F (36.3 C)-98.1 F (36.7 C)] 97.9 F (36.6 C) (09/10 0615) Pulse Rate:  [79-87] 87 (09/10 0615) Resp:  [16-20] 18 (09/10 0615) BP: (109-127)/(60-70) 118/60 mmHg (09/10 0615) SpO2:  [96 %-100 %] 96 % (09/10 0615)  Intake/Output from previous day: 09/09 0701 - 09/10 0700 In: 20 [I.V.:20] Out: 590 [Drains:590] Intake/Output from this shift:    . acyclovir ointment   Topical Q3H  . ALPRAZolam  0.5 mg Oral QHS  . aspirin EC  325 mg Oral Daily  . bisoprolol  10 mg Oral QPC breakfast  . cholecalciferol  3,000 Units Oral QPC breakfast  . clopidogrel  75 mg Oral Q breakfast  . cyclobenzaprine  10 mg Oral QHS  . docusate sodium  100 mg Oral TID WC  . estrogens (conjugated)  0.625 mg Oral QPC breakfast  . famotidine  20 mg Oral BID AC  . fluconazole  100 mg Oral Daily  . furosemide  40 mg Oral QPC breakfast  . levETIRAcetam  500 mg Oral BID AC & HS  . mineral oil  30 mL Oral QHS  . nystatin  5 mL Mouth/Throat QID  . OxyCODONE  20 mg Oral Custom  . potassium chloride  80 mEq Oral QHS  . thyroid  150 mg Oral QPC breakfast      Physical Exam: The patient appears to be in no distress.  Head and neck exam reveals that the pupils are equal and reactive.  The extraocular movements are full.  There is no scleral icterus.  Mouth and pharynx are benign.  No lymphadenopathy.  No carotid bruits.  The jugular venous pressure is normal.  Thyroid is not enlarged or tender.  Chest is clear to percussion and auscultation.  No rales or rhonchi.  Expansion of the chest is symmetrical.  Heart reveals no abnormal lift or heave.  First and second heart sounds are normal.  There is no  gallop rub or click. Grade 2/6 systolic flow murmur at upper LSE.  The abdomen is soft and nontender.  Bowel sounds are good.  There is no hepatosplenomegaly or mass.  There are no abdominal bruits.  Extremities reveal no phlebitis or edema.  Pedal pulses are good.  There is no cyanosis or clubbing.  Neurologic exam is normal strength and no lateralizing weakness.  No sensory deficits. Alert and oriented this am  Integument reveals no rash  Lab Results:  Recent Labs  12/05/12 0425 12/06/12 0610  WBC 13.0* 10.5  HGB 5.1* 4.9*  PLT 257 246    Recent Labs  12/05/12 0900 12/06/12 0610  NA 128* 131*  K 4.1 4.5  CL 93* 96  CO2 28 29  GLUCOSE 100* 101*  BUN 9 9  CREATININE 0.63 0.74   No results found for this basename: TROPONINI, CK, MB,  in the last 72 hours Hepatic Function Panel  Recent Labs  12/04/12 0400  PROT 4.2*  ALBUMIN 2.0*  AST 7  ALT <5  ALKPHOS 58  BILITOT 0.3   No results found for this basename: CHOL,  in the last 72 hours No results found for this basename: PROTIME,  in the last 72 hours  Imaging: No results found.  Cardiac Studies: 2-D echocardiogram dated 10/04/12:  Study Conclusions  - Left ventricle: The cavity size was normal.  Wall thickness was increased in a pattern of mild LVH. Systolic function was vigorous. The estimated ejection fraction was in the range of 65% to 70%. Wall motion was normal; there were no regional wall motion abnormalities. Doppler parameters are consistent with abnormal left ventricular relaxation (grade 1 diastolic dysfunction). - Right atrium: The atrium was mildly dilated. - Pulmonary arteries: PA peak pressure: 53mm Hg (S).  Assessment/Plan:  1. Status post cholecystectomy. 2. hypertensive cardiovascular disease  3. sinus tachycardia resolving. 4. Confusion probably secondary to pain meds and being in ICU. Resolved.    Plan: Continue home meds. Probably home soon.   LOS: 7 days    [name] 12/06/2012, 8:03 AM

## 2012-12-07 LAB — CBC WITH DIFFERENTIAL/PLATELET
Basophils Absolute: 0 10*3/uL (ref 0.0–0.1)
Eosinophils Relative: 4 % (ref 0–5)
Lymphocytes Relative: 19 % (ref 12–46)
MCV: 64.5 fL — ABNORMAL LOW (ref 78.0–100.0)
Monocytes Relative: 12 % (ref 3–12)
Neutrophils Relative %: 65 % (ref 43–77)
Platelets: 279 10*3/uL (ref 150–400)
RBC: 2.73 MIL/uL — ABNORMAL LOW (ref 3.87–5.11)
RDW: 25.2 % — ABNORMAL HIGH (ref 11.5–15.5)
WBC: 9.2 10*3/uL (ref 4.0–10.5)

## 2012-12-07 LAB — BASIC METABOLIC PANEL
CO2: 29 mEq/L (ref 19–32)
Chloride: 96 mEq/L (ref 96–112)
Creatinine, Ser: 0.6 mg/dL (ref 0.50–1.10)
GFR calc Af Amer: >90 mL/min (ref >90)
Potassium: 4.2 mEq/L (ref 3.5–5.1)
Sodium: 130 mEq/L — ABNORMAL LOW (ref 135–145)

## 2012-12-07 MED ORDER — FLUCONAZOLE 100 MG PO TABS: 100.0000 mg | ORAL_TABLET | Freq: Every day | ORAL | Status: DC

## 2012-12-07 NOTE — Progress Notes (Signed)
GS Progress Note Subjective: The patient is doing marvelously well.  Wants to go home.  Will get [facility] to see tomorrow.    Objective: Vital signs in last 24 hours: Temp:  [97.4 F (36.3 C)-98.2 F (36.8 C)] 98.2 F (36.8 C) (09/11 0620) Pulse Rate:  [76-88] 84 (09/11 0620) Resp:  [20] 20 (09/11 0620) BP: (108-118)/(60-70) 113/60 mmHg (09/11 0620) SpO2:  [100 %] 100 % (09/11 0620) Last BM Date: 12/05/12  Intake/Output from previous day: 09/10 0701 - 09/11 0700 In: 480 [P.O.:480] Out: 1000 [Urine:450; Drains:550] Intake/Output this shift: Total I/O In: -  Out: 280 [Drains:280]  Lungs: Clear  Abd: Soft, good bowel sounds  Extremities: No changes  Neuro: Intact  Lab Results: CBC   Recent Labs  12/06/12 1120 12/07/12 0500  WBC 12.6* 9.2  HGB 5.2* 4.8*  HCT 19.0* 17.6*  PLT 312 279   BMET  Recent Labs  12/06/12 0610 12/07/12 0500  NA 131* 130*  K 4.5 4.2  CL 96 96  CO2 29 29  GLUCOSE 101* 99  BUN 9 7  CREATININE 0.74 0.60  CALCIUM 7.9* 7.9*   PT/INR No results found for this basename: LABPROT, INR,  in the last 72 hours ABG No results found for this basename: PHART, PCO2, PO2, HCO3,  in the last 72 hours  Studies/Results: No results found.  Anti-infectives: Anti-infectives   Start     Dose/Rate Route Frequency Ordered Stop   12/05/12 1000  fluconazole (DIFLUCAN) tablet 100 mg     100 mg Oral Daily 12/04/12 2108     12/03/12 1030  fluconazole (DIFLUCAN) IVPB 100 mg  Status:  Discontinued     100 mg 50 mL/hr over 60 Minutes Intravenous Every 24 hours 12/03/12 0913 12/04/12 2111   11/29/12 1630  ciprofloxacin (CIPRO) IVPB 400 mg  Status:  Discontinued     400 mg 200 mL/hr over 60 Minutes Intravenous Every 12 hours 11/29/12 1621 12/04/12 1449   11/29/12 0600  ciprofloxacin (CIPRO) IVPB 400 mg     400 mg 200 mL/hr over 60 Minutes Intravenous On call to O.R. 11/28/12 1444 11/29/12 0836      Assessment/Plan: s/p  Procedure(s): ATTEMPTED LAPAROSCOPIC CHOLECYSTECTOMY  CHOLECYSTECTOMY, COMMON BILE DUCT EXPLORATION, T-TUBE PLACEMENT Discharge Get [facility] to see promptly Remove staples prior to discharge  LOS: 8 days    Marta Lamas. Gae Bon, MD, FACS [phone] [facility] 12/07/2012

## 2012-12-07 NOTE — Progress Notes (Signed)
Subjective:  The patient feels well.  She is eating well.  She is looking forward to going home today No cardiac complaints today.   Objective:  Vital Signs in the last 24 hours: Temp:  [97.4 F (36.3 C)-98.2 F (36.8 C)] 98.2 F (36.8 C) (09/11 0620) Pulse Rate:  [76-88] 84 (09/11 0620) Resp:  [20] 20 (09/11 0620) BP: (108-118)/(60-70) 113/60 mmHg (09/11 0620) SpO2:  [100 %] 100 % (09/11 0620)  Intake/Output from previous day: 09/10 0701 - 09/11 0700 In: 480 [P.O.:480] Out: 1000 [Urine:450; Drains:550] Intake/Output from this shift:    . acyclovir ointment   Topical Q3H  . ALPRAZolam  0.5 mg Oral QHS  . aspirin EC  325 mg Oral Daily  . bisoprolol  10 mg Oral QPC breakfast  . cholecalciferol  3,000 Units Oral QPC breakfast  . clopidogrel  75 mg Oral Q breakfast  . cyclobenzaprine  10 mg Oral QHS  . docusate sodium  100 mg Oral TID WC  . estrogens (conjugated)  0.625 mg Oral QPC breakfast  . famotidine  20 mg Oral BID AC  . fluconazole  100 mg Oral Daily  . furosemide  40 mg Oral QPC breakfast  . levETIRAcetam  500 mg Oral BID AC & HS  . mineral oil  30 mL Oral QHS  . nystatin  5 mL Mouth/Throat QID  . OxyCODONE  20 mg Oral Custom  . potassium chloride  80 mEq Oral QHS  . senna  2 tablet Oral TID  . sodium chloride  10-40 mL Intracatheter Q12H  . thyroid  150 mg Oral QPC breakfast      Physical Exam: The patient appears to be in no distress.  Head and neck exam reveals that the pupils are equal and reactive.  The extraocular movements are full.  There is no scleral icterus.  Mouth and pharynx are benign.  No lymphadenopathy.  No carotid bruits.  The jugular venous pressure is normal.  Thyroid is not enlarged or tender.  Chest is clear to percussion and auscultation.  No rales or rhonchi.  Expansion of the chest is symmetrical.  Heart reveals no abnormal lift or heave.  First and second heart sounds are normal.  There is no  gallop rub or click. Grade 2/6  systolic flow murmur at upper LSE.  The abdomen is soft and nontender.  Bowel sounds are good. There is no hepatosplenomegaly or mass.  There are no abdominal bruits.  Extremities reveal no phlebitis or edema.  Pedal pulses are good.  There is no cyanosis or clubbing.  Neurologic exam is normal strength and no lateralizing weakness.  No sensory deficits. Alert and oriented this am  Integument reveals no rash  Lab Results:  Recent Labs  12/06/12 1120 12/07/12 0500  WBC 12.6* 9.2  HGB 5.2* 4.8*  PLT 312 279    Recent Labs  12/06/12 0610 12/07/12 0500  NA 131* 130*  K 4.5 4.2  CL 96 96  CO2 29 29  GLUCOSE 101* 99  BUN 9 7  CREATININE 0.74 0.60   No results found for this basename: TROPONINI, CK, MB,  in the last 72 hours Hepatic Function Panel No results found for this basename: PROT, ALBUMIN, AST, ALT, ALKPHOS, BILITOT, BILIDIR, IBILI,  in the last 72 hours No results found for this basename: CHOL,  in the last 72 hours No results found for this basename: PROTIME,  in the last 72 hours  Imaging: No results found.  Cardiac  Studies: 2-D echocardiogram dated 10/04/12:  Study Conclusions  - Left ventricle: The cavity size was normal. Wall thickness was increased in a pattern of mild LVH. Systolic function was vigorous. The estimated ejection fraction was in the range of 65% to 70%. Wall motion was normal; there were no regional wall motion abnormalities. Doppler parameters are consistent with abnormal left ventricular relaxation (grade 1 diastolic dysfunction). - Right atrium: The atrium was mildly dilated. - Pulmonary arteries: PA peak pressure: 53mm Hg (S).  Assessment/Plan:  1. Status post cholecystectomy. 2. hypertensive cardiovascular disease  3. sinus tachycardia resolved 4. Confusion probably secondary to pain meds and being in ICU. Resolved.    Plan: Continue home meds.  Home today.  I told her that I would like to see her in my office in about 2-3 weeks  for followup.  LOS: 8 days    [name] 12/07/2012, 8:19 AM

## 2012-12-07 NOTE — Discharge Summary (Signed)
Physician Discharge Summary  Patient ID: [name] MRN: [id number] DOB/AGE: 1945/10/15 67 y.o.  Admit date: 11/29/2012 Discharge date: 12/07/2012  Admission Diagnoses:  Discharge Diagnoses:  Active Problems:   * No active hospital problems. *   Discharged Condition: good  Hospital Course: Patient admitted after a complex open cholecystectomy with common bile duct exploration.  Postoperatively was plagued with nausea and vomiting for several days leading to other problems because she could not take her oral medications.  Eventually this resolved and the pateint is eating a normal diet currently without a problem. Bile bag attached to her T-tube was initially not working well because it was not being kept on gravity drainage,b ut now is working fine with about 500cc of bile being drained per day. Hypertension is well controlled currently and I appreciate Dr. 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 [name] and I appreciate his assistance.  Consults: cardiology and hematology/oncology  Significant Diagnostic Studies: labs: serial cbcs and electrolytes  Treatments: IV hydration, antibiotics: Cipro, analgesia: Oxycodone and Oxycontin, therapies: PT and OT, procedures: PICC line and phlebotomy and surgery: Open cholecystectomy with CBD exploration  Discharge Exam: Blood pressure 113/60, pulse 84, temperature 98.2 F (36.8 C), temperature source Oral, resp. rate 20, height 5\' 3"  (1.6 m), weight 72 kg (158 lb 11.7 oz), SpO2 100.00%. General appearance: alert, cooperative, no distress, pale and very appreciative Head: Normocephalic, without obvious abnormality, atraumatic, alopecia Resp: clear to auscultation bilaterally GI: soft, non-tender; bowel sounds normal; no masses,  no organomegaly and woudn is clean and dry.  No infection.  Drains are in place and draining properly Incision/Wound:Clean and dry  Disposition: 01-Home or Self  Care  Discharge Orders   Future Appointments Provider Department Dept Phone   12/19/2012 12:00 PM Cherylynn Ridges, MD [facility] [place] [phone]   12/20/2012 1:00 PM Rachael Fee [facility] [phone]   12/21/2012 1:30 PM Ronal Fear, NP [facility] [phone]   01/03/2013 1:00 PM Rachael Fee [facility] [phone]   01/17/2013 1:00 PM Rachael Fee [facility] [phone]   02/07/2013 2:00 PM [name], MD  [facility] [place]) [phone]   05/03/2013 1:00 PM [facility] [phone]   05/03/2013 2:00 PM Sherren Kerns, MD [facility] [place] [phone]   Future Orders Complete By Expires   Call MD for:  difficulty breathing, headache or visual disturbances  As directed    Call MD for:  extreme fatigue  As directed    Call MD for:  hives  As directed    Call MD for:  persistant dizziness or light-headedness  As directed    Call MD for:  persistant nausea and vomiting  As directed    Call MD for:  redness, tenderness, or signs of infection (pain, swelling, redness, odor or green/yellow discharge around incision site)  As directed    Call MD for:  severe uncontrolled pain  As directed    Call MD for:  temperature >100.4  As directed    Diet - low sodium heart healthy  As directed    Discharge instructions  As directed    Comments:     Guard T-tube.  Do not allow it to be pulled or dislodged.   Discharge wound care:  As directed    Comments:  Cover drain sites during showers, but may shower and pat wounds dry.   Increase activity slowly  As directed    Lifting restrictions  As directed    Comments:     No lifting > 20 pounds for 4 weeks.       Medication List         albuterol-ipratropium 18-103 MCG/ACT inhaler  Commonly known as:  COMBIVENT  Inhale 2  puffs into the lungs every 6 (six) hours as needed for wheezing.     ALPRAZolam 0.5 MG tablet  Commonly known as:  XANAX  Take 0.5 mg by mouth at bedtime.     ANDROXY 10 MG tablet  Generic drug:  fluoxymesterone  Take 2.5 mg by mouth daily as needed (before sexual activity).     aspirin 325 MG tablet  Take 325 mg by mouth daily after breakfast.     azithromycin 250 MG tablet  Commonly known as:  ZITHROMAX  Take 250-500 mg by mouth daily. Takes 2 tablets daily for 3 days then 1 tablets daily     bisoprolol 10 MG tablet  Commonly known as:  ZEBETA  Take 10 mg by mouth daily after breakfast.     C[place] 450 MG Caps  Take 900 mg by mouth at bedtime.     cephALEXin 500 MG capsule  Commonly known as:  KEFLEX  Take 500 mg by mouth 2 (two) times daily. For bladder infection     cholecalciferol 1000 UNITS tablet  Commonly known as:  VITAMIN D  Take 3,000 Units by mouth daily after breakfast.     clopidogrel 75 MG tablet  Commonly known as:  PLAVIX  Take 75 mg by mouth daily after breakfast.     cyanocobalamin 1000 MCG/ML injection  Commonly known as:  (VITAMIN B-12)  Inject 1,000 mcg into the muscle as needed (for energy).     cyclobenzaprine 10 MG tablet  Commonly known as:  FLEXERIL  Take 10 mg by mouth See admin instructions. Patient make take 10 mg  twice a day as needed for spasms. Patient takes 10 mg at bedtime each day.     diphenhydrAMINE 25 MG tablet  Commonly known as:  BENADRYL  Take 50 mg by mouth See admin instructions. Patient may take 50 mg three times a day as needed. Patient takes 50 mg at bedtime each day.     docusate sodium 100 MG capsule  Commonly known as:  COLACE  Take 100 mg by mouth 3 (three) times daily with meals.     famotidine 20 MG tablet  Commonly known as:  PEPCID  Take 20 mg by mouth 2 (two) times daily before a meal.     fexofenadine 180 MG tablet  Commonly known as:  ALLEGRA  Take 180 mg by mouth daily after breakfast.      fluconazole 100 MG tablet  Commonly known as:  DIFLUCAN  Take 1 tablet (100 mg total) by mouth daily.     furosemide 40 MG tablet  Commonly known as:  LASIX  Take 80 mg by mouth daily after breakfast.     Garlic Oil 1000 MG Caps  Take 1,000 mg by mouth daily after breakfast.     hydrochlorothiazide 12.5 MG tablet  Commonly known as:  HYDRODIURIL  Take 12.5 mg by mouth daily as needed (systolic number is greater than 130).     levETIRAcetam 500 MG tablet  Commonly known as:  KEPPRA  Take 500 mg by mouth  See admin instructions. Takes 1 tablet after breakfast and 1 tablet at bedtime     levofloxacin 750 MG tablet  Commonly known as:  LEVAQUIN  Take 750 mg by mouth daily after breakfast.     lidocaine-prilocaine cream  Commonly known as:  EMLA  Apply 1 application topically daily as needed (Apply to phlebotomy site at least one hour prior to phlebotomy.).     lisinopril 10 MG tablet  Commonly known as:  PRINIVIL,ZESTRIL  Take 10 mg by mouth 2 (two) times daily as needed (if SBP is greater than 130).     magnesium oxide 400 MG tablet  Commonly known as:  MAG-OX  Take 1,200 mg by mouth daily after breakfast.     mineral oil liquid  Take 30 mLs by mouth at bedtime. With Juice     nitroGLYCERIN 0.4 MG SL tablet  Commonly known as:  NITROSTAT  Place 0.4 mg under the tongue every 5 (five) minutes as needed for chest pain.     ondansetron 4 MG disintegrating tablet  Commonly known as:  ZOFRAN ODT  Take 1 tablet (4 mg total) by mouth every 8 (eight) hours as needed for nausea.     oxyCODONE 20 MG 12 hr tablet  Commonly known as:  OXYCONTIN  - Take 1 tablet (20 mg total) by mouth every 8 (eight) hours. Brand name medically necessary.  - Patient specifies that she takes this at 12 noon, 8pm & 4am (scheduled)     oxyCODONE 10 MG 12 hr tablet  Commonly known as:  OXYCONTIN  Take 10 mg by mouth every 12 (twelve) hours as needed (for breakthrough pain). Brand name medically  necessary.     oxyCODONE 15 MG immediate release tablet  Commonly known as:  ROXICODONE  Take 7.5-15 mg by mouth every 8 (eight) hours as needed (for breakthrough pain). Brand name medically necessary.     potassium chloride 10 MEQ CR capsule  Commonly known as:  MICRO-K  Take 80 mEq by mouth daily after breakfast.     PREMARIN 0.625 MG tablet  Generic drug:  estrogens (conjugated)  Take 0.625 mg by mouth daily after breakfast.     promethazine 25 MG suppository  Commonly known as:  PHENERGAN  Place 25 mg rectally every 6 (six) hours as needed for nausea.     SENNA LAX PO  Take 1 tablet by mouth 3 (three) times daily with meals.     temazepam 30 MG capsule  Commonly known as:  RESTORIL  Take 30 mg by mouth at bedtime.     thyroid 60 MG tablet  Commonly known as:  ARMOUR  Take 150 mg by mouth daily after breakfast. Levothyroxine is not effective. Takes only armour thyroid.     triamcinolone cream 0.1 %  Commonly known as:  KENALOG  Apply 1 application topically 2 (two) times daily as needed (rash).     Valerian 100 MG Caps  Take 100 mg by mouth at bedtime as needed (for sleep).           Follow-up Information   Follow up with Khya Halls, Marta Lamas, MD. Schedule an appointment as soon as possible for a visit in 2 weeks.   Specialty:  General Surgery   Contact information:   [address] [place], [address]  [place] [address] [phone]       Signed: Cherylynn Ridges 12/07/2012, 6:54 AM

## 2012-12-07 NOTE — Discharge Planning (Addendum)
Copy of home instructions to pt and husband, both verbalize understanding. Rx was called into pharmacy.  Will dc per w/c to private car home with all personal belongings, acomp. By husband at 52

## 2012-12-07 NOTE — Care Management Note (Signed)
  Page 1 of 1   12/07/2012     11:00:53 AM   CARE MANAGEMENT NOTE 12/07/2012  Patient:  [name], [name]   Account Number:  [id number]  Date Initiated:  11/30/2012  Documentation initiated by:  Donn Pierini  Subjective/Objective Assessment:   Pt admitted s/p DIAGNOSTIC LAPAROSCOPY WITH ATTEMPTED LAPAROSCOPIC CHOLECYSTECTOMY  CHOLECYSTECTOMY, COMMON BILE DUCT EXPLORATION, T-TUBE PLACEMENT, CHOLANGIOGRAM     Action/Plan:   PTA pt lived at home with spouse- NCM to follow pt progression for d/c needs   Anticipated DC Date:  12/07/2012   Anticipated DC Plan:  HOME W HOME HEALTH SERVICES      DC Planning Services  CM consult      Choice offered to / List presented to:          [facility] arranged  HH-1 RN      [facility] agency  [facility]   Status of service:  In process, will continue to follow Medicare Important Message given?   (If response is "NO", the following Medicare IM given date fields will be blank) Date Medicare IM given:   Date Additional Medicare IM given:    Discharge Disposition:    Per UR Regulation:  Reviewed for med. necessity/level of care/duration of stay  If discussed at Long Length of Stay Meetings, dates discussed:   12/05/2012    Comments:

## 2012-12-11 ENCOUNTER — Telehealth: Payer: Self-pay | Admitting: Cardiology

## 2012-12-11 NOTE — Telephone Encounter (Signed)
New problem ° ° ° °Please call pt °

## 2012-12-11 NOTE — Telephone Encounter (Signed)
Scheduled follow up appointment

## 2012-12-12 ENCOUNTER — Other Ambulatory Visit: Payer: Self-pay | Admitting: *Deleted

## 2012-12-12 NOTE — Progress Notes (Signed)
Hgb 4.9 on 12/11/12. To check again Thurs per Dr [name] and f/u in the office in 2 weeks. Spoke to pt's husband Onalee Hua. H/H is coming out on Friday. Ok to check CBC then. Request sent to scheduling to make appt in 2 weeks.

## 2012-12-13 ENCOUNTER — Telehealth: Payer: Self-pay | Admitting: Hematology & Oncology

## 2012-12-13 NOTE — Telephone Encounter (Signed)
Pt made 10-8 is aware MD wanted to her in 2 weeks

## 2012-12-15 ENCOUNTER — Telehealth (INDEPENDENT_AMBULATORY_CARE_PROVIDER_SITE_OTHER): Payer: Self-pay

## 2012-12-15 ENCOUNTER — Telehealth: Payer: Self-pay | Admitting: *Deleted

## 2012-12-15 NOTE — Telephone Encounter (Signed)
Patient states she is feeling bad,; denies constipation, diarrhea  Temp,or  redness,odor,  from incision site. She has two drains; one is draining very little per patient the other  needs to be emptied often, she did not receive patient teaching on measuring the fluid upon discharge per patient .Advised patient to call on call Md if her condition worsens or changes. Patient verbalized understanding

## 2012-12-15 NOTE — Telephone Encounter (Signed)
[facility] called to let us know that patients Hgb is 4.4.  Dr. [name] notified or this.  No orders received

## 2012-12-19 ENCOUNTER — Ambulatory Visit (INDEPENDENT_AMBULATORY_CARE_PROVIDER_SITE_OTHER): Payer: Medicare Other | Admitting: General Surgery

## 2012-12-19 ENCOUNTER — Encounter (INDEPENDENT_AMBULATORY_CARE_PROVIDER_SITE_OTHER): Payer: Self-pay | Admitting: General Surgery

## 2012-12-19 VITALS — BP 94/58 | HR 84 | Temp 97.4°F | Resp 14 | Ht 63.0 in | Wt 145.0 lb

## 2012-12-19 DIAGNOSIS — Z09 Encounter for follow-up examination after completed treatment for conditions other than malignant neoplasm: Secondary | ICD-10-CM

## 2012-12-19 NOTE — Progress Notes (Signed)
The patient comes in today very weakened. She looks very pale but this is normal for her. Her level of pain is significant however is not from peritonitis. It appears to be pain at her drain sites.  She's had minimal drainage from her [name] drain. Most of her drainage has come from her T-tube. The amount varies. At this point we are ready to clamp her T tube. A lever Blake drain in place. We see the patient in approximately one week for followup appointment. At that time if she's had minimal drainage from her [name] drain and I will remove it. We need to keep her T tube in place for at least another  3 weeks. At that time we will get a T-tube cholangiogram to assess the patency of her biliary common duct. It shows good flow into her duodenum and we will go ahead and remove the T-tube.  A T-tube cholangiogram will be performed in approximately 3 weeks.  The patient will need a comprehensive metabolic panel performed with her next blood draw. By her report she is to have labs drawn in Dr. Gustavo Lah office tomorrow.

## 2012-12-20 ENCOUNTER — Ambulatory Visit: Payer: Self-pay | Admitting: Nurse Practitioner

## 2012-12-20 ENCOUNTER — Ambulatory Visit (HOSPITAL_BASED_OUTPATIENT_CLINIC_OR_DEPARTMENT_OTHER): Payer: Medicare Other | Admitting: Lab

## 2012-12-20 ENCOUNTER — Other Ambulatory Visit: Payer: Self-pay | Admitting: *Deleted

## 2012-12-20 DIAGNOSIS — Z48812 Encounter for surgical aftercare following surgery on the circulatory system: Secondary | ICD-10-CM

## 2012-12-20 DIAGNOSIS — D45 Polycythemia vera: Secondary | ICD-10-CM

## 2012-12-20 DIAGNOSIS — R1011 Right upper quadrant pain: Secondary | ICD-10-CM

## 2012-12-20 DIAGNOSIS — M797 Fibromyalgia: Secondary | ICD-10-CM

## 2012-12-20 DIAGNOSIS — K810 Acute cholecystitis: Secondary | ICD-10-CM

## 2012-12-20 DIAGNOSIS — T148XXA Other injury of unspecified body region, initial encounter: Secondary | ICD-10-CM

## 2012-12-20 DIAGNOSIS — IMO0001 Reserved for inherently not codable concepts without codable children: Secondary | ICD-10-CM

## 2012-12-20 LAB — CBC WITH DIFFERENTIAL (CANCER CENTER ONLY)
BASO#: 0 10*3/uL (ref 0.0–0.2)
EOS%: 0.4 % (ref 0.0–7.0)
Eosinophils Absolute: 0 10*3/uL (ref 0.0–0.5)
HCT: 19 % — ABNORMAL LOW (ref 34.8–46.6)
LYMPH%: 9.1 % — ABNORMAL LOW (ref 14.0–48.0)
MCH: 16.7 pg — ABNORMAL LOW (ref 26.0–34.0)
MCV: 68 fL — ABNORMAL LOW (ref 81–101)
MONO#: 0.5 10*3/uL (ref 0.1–0.9)
MONO%: 9.7 % (ref 0.0–13.0)
NEUT%: 80.6 % — ABNORMAL HIGH (ref 39.6–80.0)
RBC: 2.81 10*6/uL — ABNORMAL LOW (ref 3.70–5.32)
RDW: 25.2 % — ABNORMAL HIGH (ref 11.1–15.7)
WBC: 5.3 10*3/uL (ref 3.9–10.0)

## 2012-12-20 LAB — CMP (CANCER CENTER ONLY)
ALT(SGPT): 12 U/L (ref 10–47)
BUN, Bld: 8 mg/dL (ref 7–22)
CO2: 28 mEq/L (ref 18–33)
Calcium: 8.4 mg/dL (ref 8.0–10.3)
Chloride: 99 mEq/L (ref 98–108)
Creat: 0.4 mg/dl — ABNORMAL LOW (ref 0.6–1.2)
Glucose, Bld: 225 mg/dL — ABNORMAL HIGH (ref 73–118)
Sodium: 136 mEq/L (ref 128–145)
Total Bilirubin: 0.6 mg/dl (ref 0.20–1.60)

## 2012-12-20 MED ORDER — OXYCODONE HCL 15 MG PO TABS: 15.0000 mg | ORAL_TABLET | Freq: Three times a day (TID) | ORAL | Status: DC | PRN

## 2012-12-20 MED ORDER — OXYCODONE HCL 20 MG PO TB12: 20.0000 mg | ORAL_TABLET | Freq: Three times a day (TID) | ORAL | Status: DC

## 2012-12-20 MED ORDER — OXYCODONE HCL 10 MG PO TB12: 10.0000 mg | ORAL_TABLET | Freq: Two times a day (BID) | ORAL | Status: DC | PRN

## 2012-12-20 NOTE — Telephone Encounter (Signed)
Pt's husband called on Monday afternoon asking to have her pain medications refilled with her lab visit today. Will route to Dr [name] for approval and place at the front desk when ready.

## 2012-12-21 ENCOUNTER — Ambulatory Visit (INDEPENDENT_AMBULATORY_CARE_PROVIDER_SITE_OTHER): Payer: Medicare Other | Admitting: Nurse Practitioner

## 2012-12-21 ENCOUNTER — Encounter: Payer: Self-pay | Admitting: Nurse Practitioner

## 2012-12-21 VITALS — BP 99/55 | HR 61 | Temp 97.8°F | Ht 63.0 in | Wt 143.0 lb

## 2012-12-21 DIAGNOSIS — I639 Cerebral infarction, unspecified: Secondary | ICD-10-CM

## 2012-12-21 DIAGNOSIS — I63239 Cerebral infarction due to unspecified occlusion or stenosis of unspecified carotid arteries: Secondary | ICD-10-CM | POA: Insufficient documentation

## 2012-12-21 DIAGNOSIS — R569 Unspecified convulsions: Secondary | ICD-10-CM | POA: Insufficient documentation

## 2012-12-21 DIAGNOSIS — D45 Polycythemia vera: Secondary | ICD-10-CM

## 2012-12-21 DIAGNOSIS — I6350 Cerebral infarction due to unspecified occlusion or stenosis of unspecified cerebral artery: Secondary | ICD-10-CM

## 2012-12-21 NOTE — Progress Notes (Signed)
[facility]  PATIENT: [name] DOB: 1945-08-23   HISTORY FROM: patient, chart REASON FOR VISIT: routine follow up  HISTORY OF PRESENT ILLNESS:  [name] is an 67 y.o. female with a history of polycythemia, hypertension and hyperlipidemia who was admitted on 09/30/2012 with acute cholecystitis. She status post drain placement. She experienced sudden onset of weakness involving her right hand as well as change in speech at around noon on 10/04/2012 in the hospital. MRI of her brain performed 10/05/2012 showed left MCA/ACA watershed infarcts. MRA showed markedly diminished flow and possible obstruction of left ICA. Patient has been taking aspirin and Plavix since admission. NIH stroke score was 2. Patient was not a TPA candidate secondary to recent drain placement and lack of recognition of acute stroke.   UPDATE 12/21/12 (LL):  [name] comes in for hospital follow up.  She is post- cholecystectomy, and post left carotid endarterectomy.  She developed seizures after discharge and was readmitted and is on Keppra now, no seizures since.  She continues on Plavix and Aspitin 325 mg daily due to polycythemia.  Patient denies medication side effects, with no signs of bleeding or excessive bruising.  She is getting home PT/OT/ST.  She states that she has not recovered the fine-motor skills in her right hand, but right leg is good.  She can walk short distances before getting fatigued.  She is very weak today, hgb <5, polycythemia is followed by Dr. [name].  She has lost 30 lbs. Since going into the hospital in July.  REVIEW OF SYSTEMS: Full 14 system review of systems performed and notable only for: constitutional: weight loss, fatigue  cardiovascular: flow murmur respiratory: short of breath, snoring endocrine: feeling cold  ear/nose/throat: N/A  Hematology/Lymph: anemia musculoskeletal: aching muscles skin: N/A genitourinary: N/A Gastrointestinal:  N/A allergy/immunology: allergies, runny nose, skin sensitivity neurological: memory loss, numbness, weakness, seizure sleep: snoring psychiatric: too much sleep, decreased energy, change in appetite   ALLERGIES: Allergies  Allergen Reactions  . Codeine Nausea Only  . Synthroid [Levothyroxine Sodium] Other (See Comments)    Not effective. Causes excessive sleepiness. Takes armour thyroid  . Calcium Channel Blockers Palpitations  . Penicillins Rash    HOME MEDICATIONS: Outpatient Prescriptions Prior to Visit  Medication Sig Dispense Refill  . albuterol-ipratropium (COMBIVENT) 18-103 MCG/ACT inhaler Inhale 2 puffs into the lungs every 6 (six) hours as needed for wheezing.      Marland Kitchen ALPRAZolam (XANAX) 0.5 MG tablet Take 0.5 mg by mouth at bedtime.      Marland Kitchen aspirin 325 MG tablet Take 325 mg by mouth daily after breakfast.       . azithromycin (ZITHROMAX) 250 MG tablet Take 250-500 mg by mouth daily. Takes 2 tablets daily for 3 days then 1 tablets daily      . bisoprolol (ZEBETA) 10 MG tablet Take 10 mg by mouth daily after breakfast.       . Cascara [place] 450 MG CAPS Take 900 mg by mouth at bedtime.      . cephALEXin (KEFLEX) 500 MG capsule Take 500 mg by mouth 2 (two) times daily. For bladder infection      . cholecalciferol (VITAMIN D) 1000 UNITS tablet Take 3,000 Units by mouth daily after breakfast.       . clopidogrel (PLAVIX) 75 MG tablet Take 75 mg by mouth daily after breakfast.       . cyanocobalamin (,VITAMIN B-12,) 1000 MCG/ML injection Inject 1,000 mcg into the muscle as needed (  for energy).      . cyclobenzaprine (FLEXERIL) 10 MG tablet Take 10 mg by mouth See admin instructions. Patient make take 10 mg  twice a day as needed for spasms. Patient takes 10 mg at bedtime each day.      . diphenhydrAMINE (BENADRYL) 25 MG tablet Take 50 mg by mouth See admin instructions. Patient may take 50 mg three times a day as needed. Patient takes 50 mg at bedtime each day.      . docusate  sodium (COLACE) 100 MG capsule Take 100 mg by mouth 3 (three) times daily with meals.       Marland Kitchen estrogens, conjugated, (PREMARIN) 0.625 MG tablet Take 0.625 mg by mouth daily after breakfast.       . famotidine (PEPCID) 20 MG tablet Take 20 mg by mouth 2 (two) times daily before a meal.      . fexofenadine (ALLEGRA) 180 MG tablet Take 180 mg by mouth daily after breakfast.       . fluconazole (DIFLUCAN) 100 MG tablet Take 1 tablet (100 mg total) by mouth daily.  3 tablet  0  . fluoxymesterone (ANDROXY) 10 MG tablet Take 2.5 mg by mouth daily as needed (before sexual activity).       . furosemide (LASIX) 40 MG tablet Take 80 mg by mouth daily after breakfast.       . Garlic Oil 1000 MG CAPS Take 1,000 mg by mouth daily after breakfast.       . hydrochlorothiazide (HYDRODIURIL) 12.5 MG tablet Take 12.5 mg by mouth daily as needed (systolic number is greater than 130).       Marland Kitchen levETIRAcetam (KEPPRA) 500 MG tablet Take 500 mg by mouth See admin instructions. Takes 1 tablet after breakfast and 1 tablet at bedtime      . levofloxacin (LEVAQUIN) 750 MG tablet Take 750 mg by mouth daily after breakfast.      . lidocaine-prilocaine (EMLA) cream Apply 1 application topically daily as needed (Apply to phlebotomy site at least one hour prior to phlebotomy.).       Marland Kitchen lisinopril (PRINIVIL,ZESTRIL) 10 MG tablet Take 10 mg by mouth 2 (two) times daily as needed (if SBP is greater than 130).       . magnesium oxide (MAG-OX) 400 MG tablet Take 1,200 mg by mouth daily after breakfast.       . mineral oil liquid Take 30 mLs by mouth at bedtime. With Juice      . nitroGLYCERIN (NITROSTAT) 0.4 MG SL tablet Place 0.4 mg under the tongue every 5 (five) minutes as needed for chest pain.      Marland Kitchen ondansetron (ZOFRAN ODT) 4 MG disintegrating tablet Take 1 tablet (4 mg total) by mouth every 8 (eight) hours as needed for nausea.  30 tablet  1  . oxyCODONE (OXYCONTIN) 10 MG 12 hr tablet Take 1 tablet (10 mg total) by mouth every 12  (twelve) hours as needed (for breakthrough pain). Brand name medically necessary.  60 tablet  0  . oxyCODONE (OXYCONTIN) 20 MG 12 hr tablet Take 1 tablet (20 mg total) by mouth every 8 (eight) hours. Brand name medically necessary.  90 tablet  0  . oxyCODONE (ROXICODONE) 15 MG immediate release tablet Take 1 tablet (15 mg total) by mouth every 8 (eight) hours as needed (for breakthrough pain). Brand name medically necessary.  90 tablet  0  . potassium chloride (MICRO-K) 10 MEQ CR capsule Take 80 mEq by mouth daily  after breakfast.      . promethazine (PHENERGAN) 25 MG suppository Place 25 mg rectally every 6 (six) hours as needed for nausea.      . Sennosides (SENNA LAX PO) Take 1 tablet by mouth 3 (three) times daily with meals.       . temazepam (RESTORIL) 30 MG capsule Take 30 mg by mouth at bedtime.       Marland Kitchen thyroid (ARMOUR) 60 MG tablet Take 150 mg by mouth daily after breakfast. Levothyroxine is not effective. Takes only armour thyroid.      Marland Kitchen triamcinolone cream (KENALOG) 0.1 % Apply 1 application topically 2 (two) times daily as needed (rash).       . Valerian 100 MG CAPS Take 100 mg by mouth at bedtime as needed (for sleep).        No facility-administered medications prior to visit.    PAST MEDICAL HISTORY: Past Medical History  Diagnosis Date  . 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 Fibromyalgia     chronic pain syndrome  . Postmenopausal state     on hormone replacement therapy  . Aortic valve sclerosis     by echocardiogram 12/04/2009  . Hyperlipidemia   . 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 GERD (gastroesophageal reflux disease)   . Difficult intubation     grade 3 airway  . PAC (premature atrial contraction)   . Hypertension   . Asthma   . Seizures     only seizure was 10/14/12;takes Keppra daily  . Chronic constipation     takes Mineral Oil,Juice,Enema(prn),and Miralax(Prn) and Cascara nightly  .  Insomnia     takes restoril nightly and Xanax  . Hypothyroidism   . Seasonal allergies     takes Allegra in am and Benadryl at night  . Occlusion and stenosis of carotid artery without mention of cerebral infarction 10/26/2012  . 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 Acute cholecystitis 10/07/2012    s/p pec drain due to recent CVA, pending chole 11/2012    PAST SURGICAL HISTORY: Past Surgical History  Procedure Laterality Date  . Endarterectomy Left 10/10/2012    Procedure: ENDARTERECTOMY CAROTID- LEFT NECK WITH GREATER SAPHENOUS VEIN PATCH LEFT LEG;  Surgeon: Sherren Kerns, MD;  Location: [facility] OR;  Service: Vascular;  Laterality: Left;  . Carotid endarterectomy Left 10-10-12    cea  . Abdominal hysterectomy  1996  . Tonsillectomy      age 80  . Nasal septum surgery      late 70's  . Colonoscopy    . Insertion of drain  10/02/12    right low abdomen and draining pus  . Picc line placed      10/15/12  . Cholecystectomy N/A 11/29/2012    Procedure: ATTEMPTED LAPAROSCOPIC CHOLECYSTECTOMY ;  Surgeon: Cherylynn Ridges, MD;  Location: [facility] OR;  Service: General;  Laterality: N/A;  . Cholecystectomy N/A 11/29/2012    Procedure: CHOLECYSTECTOMY, COMMON BILE DUCT EXPLORATION, T-TUBE PLACEMENT;  Surgeon: Cherylynn Ridges, MD;  Location: [facility] OR;  Service: General;  Laterality: N/A;    FAMILY HISTORY: Family History  Problem Relation Age of Onset  . Heart attack Father   . Coronary artery disease Mother     had aortic valve replacement    SOCIAL HISTORY: History   Social History  . Marital Status: Married    Spouse Name: N/A    Number of Children:  N/A  . Years of Education: N/A   Occupational History  . Not on file.   Social History Main Topics  . Smoking status: Never Smoker   . Smokeless tobacco: Never Used  . Alcohol Use: No  . Drug Use: No  . Sexual Activity: No   Other Topics Concern  . Not on file   Social History Narrative  . No narrative on file     PHYSICAL  EXAM  There were no vitals filed for this visit. There is no weight on file to calculate BMI.  Generalized: In no acute distress  Neck: Supple, no carotid bruits  Cardiac: Regular rate rhythm, no murmur  Pulmonary: Clear to auscultation bilaterally  Musculoskeletal: No deformity   Neurological examination  Mentation: Alert oriented to time, place, history taking, mild language dysfluency and casual conversation Cranial nerve II-XII: Facial symmetry is present. Dysarthria is noted, speech is slow, deliberate, some word finding issues. Extraocular movements are full. Visual fields are full. hearing was intact to finger rubbing bilaterally. Uvula tongue midline. head turning and shoulder shrug and were normal and symmetric.Tongue protrusion into cheek strength was normal. MOTOR: The patient has good strength in all 4 extremities, with exception of slight weakness of the right upper extremity. Decreased fine motor skill in right hand.  Left orbits right hand. SENSORY: normal and symmetric to light touch COORDINATION: finger-nose-finger, heel-to-shin bilaterally, there was no truncal ataxia REFLEXES: Deep tendon reflexes are symmetric.  GAIT/STATION: Not tested, patient too weak; in wheelchair.    DIAGNOSTIC DATA (LABS, IMAGING, TESTING) - I reviewed patient records, labs, notes, testing and imaging myself where available.  Lab Results  Component Value Date   WBC 5.3 12/20/2012   HGB 4.7* 12/20/2012   HCT 19.0* 12/20/2012   MCV 68* 12/20/2012   PLT 255 Large platelets present 12/20/2012      Component Value Date/Time   NA 136 12/20/2012 1334   NA 130* 12/07/2012 0500   K 4.1 12/20/2012 1334   K 4.2 12/07/2012 0500   CL 99 12/20/2012 1334   CL 96 12/07/2012 0500   CO2 28 12/20/2012 1334   CO2 29 12/07/2012 0500   GLUCOSE 225* 12/20/2012 1334   GLUCOSE 99 12/07/2012 0500   BUN 8 12/20/2012 1334   BUN 7 12/07/2012 0500   CREATININE 0.4* 12/20/2012 1334   CREATININE 0.60 12/07/2012 0500   CALCIUM  8.4 12/20/2012 1334   CALCIUM 7.9* 12/07/2012 0500   PROT 6.0* 12/20/2012 1334   PROT 4.2* 12/04/2012 0400   ALBUMIN 2.0* 12/04/2012 0400   AST 18 12/20/2012 1334   AST 7 12/04/2012 0400   ALT 12 12/20/2012 1334   ALT <5 12/04/2012 0400   ALKPHOS 80 12/20/2012 1334   ALKPHOS 58 12/04/2012 0400   BILITOT 0.60 12/20/2012 1334   BILITOT 0.3 12/04/2012 0400   GFRNONAA >90 12/07/2012 0500   GFRAA >90 12/07/2012 0500   Lab Results  Component Value Date   CHOL 58 10/07/2012   HDL 11* 10/07/2012   LDLCALC 23 10/07/2012   TRIG 121 10/07/2012   CHOLHDL 5.3 10/07/2012   Lab Results  Component Value Date   HGBA1C 5.5 10/07/2012   Lab Results  Component Value Date   VITAMINB12 1045* 10/06/2012   Lab Results  Component Value Date   TSH 0.051* 10/04/2012    MRI of the brain 10/05/2012 1. Scattered acute / subacute non hemorrhagic infarcts within a watershed territory between the left ACA and MCA distributions. 2. Occluded or  extremely slow flow within the left internal carotid artery at the skull base. 3. Stable atrophy and minimal white matter disease otherwise. 4. Decreased T1 marrow signal within the upper cervical spine and calvarium. This is compatible with the patient's known anemia.  MRA of the brain 10/05/2012 1. Markedly diminished signal within the left internal carotid artery and branch vessels. This corresponds to the area of infarction and raises concern for mil proximal stenosis. 2. There may be a high-grade stenosis within the cavernous segment. 3. Prominent left PCA branch vessels. This is likely related to attempts at collateral flow.  2D Echocardiogram EF 65-70% with no source of embolus.  Carotid Doppler 40-59% proximal internal carotid artery stenosis with turbulent flow.. Left - There is a 60% to 79% highest end of scale ICA stenosis in the bulb with turbulent flow throughout the ICA. Bilateral - Vertebral artery flow is antegrade.  CXR 10/04/2012 Slightly better aeration. Stable cardiomegaly. No  definite pulmonary vascular congestion.  EKG sinus bradycardia.  MRI of the brain 10/16/2012 Development of new edema signal within the left frontal and  parietal cortical and subcortical brain, without accompanying restricted diffusion. Therefore, this probably represents postictal change given the history of recurrent seizures. There are expected evolutionary changes occurring within the areas of watershed infarction of the left hemisphere. No new restricted  diffusion/acute infarction.  ASSESSMENT AND PLAN Ms. DONYEA GAFFORD is a 67 y.o. female who developed right arm weakness and difficulty with speech while hospitalized for a gallbladder attack. 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  Patient with resultant mild right hemiparesis and expressive aphasia - which are improving.   Continue aspirin 325 mg orally every day and clopidogrel 75 mg orally every day for secondary stroke prevention  and maintain strict control of hypertension with blood pressure goal below 130/90, diabetes with hemoglobin A1c goal below 6.5% and lipids with LDL cholesterol goal below 100 mg/dL.  Repeat EEG and continue Keppra for seizures. Followup in 3 months.  Davonta Stroot NP-C 12/21/2012, 1:21 PM [facility] [address] [place], [place] [address] [phone]  I have personally examined this patient, reviewed pertinent data, developed plan of care and discussed with patient and agree with above.  Delia Heady, MD

## 2012-12-21 NOTE — Patient Instructions (Signed)
Continue aspirin 325 mg orally every day and clopidogrel 75 mg orally every day for secondary stroke prevention  and maintain strict control of hypertension with blood pressure goal below 130/90, diabetes with hemoglobin A1c goal below 6.5% and lipids with LDL cholesterol goal below 100 mg/dL.  Continue Keppra for seizure prophylaxis. Followup in 3 months.  STROKE/TIA INSTRUCTIONS SMOKING Cigarette smoking nearly doubles your risk of having a stroke & is the single most alterable risk factor  If you smoke or have smoked in the last 12 months, you are advised to quit smoking for your health.  Most of the excess cardiovascular risk related to smoking disappears within a year of stopping.  Ask you doctor about anti-smoking medications  [place] Quit Line: 1-800-QUIT NOW  Free Smoking Cessation Classes [phone]  CHOLESTEROL Know your levels; limit fat & cholesterol in your diet  Lab Results  Component Value Date   CHOL 58 10/07/2012   HDL 11* 10/07/2012   LDLCALC 23 10/07/2012   TRIG 121 10/07/2012   CHOLHDL 5.3 10/07/2012      Many patients benefit from treatment even if their cholesterol is at goal.  Goal: Total Cholesterol less than 160  Goal:  LDL less than 100  Goal:  HDL greater than 40  Goal:  Triglycerides less than 150  BLOOD PRESSURE American Stroke Association blood pressure target is less that 120/80 mm/Hg  Your discharge blood pressure is:  BP: 99/55 mmHg  Monitor your blood pressure  Limit your salt and alcohol intake  Many individuals will require more than one medication for high blood pressure  DIABETES (A1c is a blood sugar average for last 3 months) Goal A1c is under 7% (A1c is blood sugar average for last 3 months)  Diabetes: No known diagnosis of diabetes    Lab Results  Component Value Date   HGBA1C 5.5 10/07/2012    Your A1c can be lowered with medications, healthy diet, and exercise.  Check your blood sugar as directed by your physician  Call your  physician if you experience unexplained or low blood sugars.  PHYSICAL ACTIVITY/REHABILITATION Goal is 30 minutes at least 4 days per week    Activity decreases your risk of heart attack and stroke and makes your heart stronger.  It helps control your weight and blood pressure; helps you relax and can improve your mood.  Participate in a regular exercise program.  Talk with your doctor about the best form of exercise for you (dancing, walking, swimming, cycling).  DIET/WEIGHT Goal is to maintain a healthy weight  Your height is:  Height: 5\' 3"  (160 cm) Your current weight is: Weight: 143 lb (64.864 kg) Your body Mass Index (BMI) is:  BMI (Calculated): 25.4  Following the type of diet specifically designed for you will help prevent another stroke.  Your goal Body Mass Index (BMI) is 19-24.  Healthy food habits can help reduce 3 risk factors for stroke:  High cholesterol, hypertension, and excess weight.

## 2012-12-22 ENCOUNTER — Encounter: Payer: Self-pay | Admitting: *Deleted

## 2012-12-22 ENCOUNTER — Other Ambulatory Visit: Payer: Self-pay | Admitting: Cardiology

## 2012-12-22 LAB — PROTEIN C ACTIVITY: Protein C Activity: 82 % (ref 75–133)

## 2012-12-22 LAB — PROTEIN C, TOTAL: Protein C, Total: 51 % — ABNORMAL LOW (ref 72–160)

## 2012-12-22 LAB — PROTEIN S ACTIVITY: Protein S Activity: 73 % (ref 69–129)

## 2012-12-25 ENCOUNTER — Other Ambulatory Visit: Payer: Self-pay | Admitting: Neurology

## 2012-12-26 ENCOUNTER — Telehealth: Payer: Self-pay | Admitting: Cardiology

## 2012-12-26 NOTE — Telephone Encounter (Signed)
New Problem  Pt is complaining of pain in her kidney area and believes she is having a urinary infection// pt request to have please call in an antibiotic called in please.

## 2012-12-26 NOTE — Telephone Encounter (Signed)
Pt called to have antibiotics for a UTI. Pt is aware that Dr. Patty Sermons is not in the office today. RN ans if she can call her PCP for that. Pt states she will see her surgeon tomorrow and will wait to get the antibiotics then.

## 2012-12-27 ENCOUNTER — Encounter (INDEPENDENT_AMBULATORY_CARE_PROVIDER_SITE_OTHER): Payer: Self-pay | Admitting: General Surgery

## 2012-12-27 ENCOUNTER — Ambulatory Visit (INDEPENDENT_AMBULATORY_CARE_PROVIDER_SITE_OTHER): Payer: Medicare Other | Admitting: General Surgery

## 2012-12-27 ENCOUNTER — Ambulatory Visit: Payer: Medicare Other | Admitting: Cardiology

## 2012-12-27 VITALS — BP 166/66 | Temp 98.3°F | Ht 63.0 in | Wt 135.8 lb

## 2012-12-27 DIAGNOSIS — Z9889 Other specified postprocedural states: Secondary | ICD-10-CM

## 2012-12-27 DIAGNOSIS — Z09 Encounter for follow-up examination after completed treatment for conditions other than malignant neoplasm: Secondary | ICD-10-CM

## 2012-12-27 NOTE — Progress Notes (Signed)
The patient comes in today having done very well after clapping her T2 approximately one week ago. Since that time she's had minimal output from her [place] drain. A total of approximately 10 cc of come out in a week, part of it which appears to be bile stained but over the last 2 days she's had 0 output.  She's had no increase in abdominal pain. She's had no fevers or chills. The day after her tube was clamped she had liver function tests performed which were normal.  On examination today the patient looks relatively well although she has lost approximately 8-10 pounds since her last visit to see [place]. She states that she is eating fairly well however she does throw up on occasion and cannot complete a meal on occasion. Her abdomen is soft and nontender. The drain sites are slightly erythematous but did not appear to be infected. The [place] drain was removed without event and completely intact. There was minimal drainage from the [place] drain site. The T-tube remained clamped.  The patient will get a T-tube cholangiogram performed the day prior to seeing me on 01/09/2013. If her T-tube cholangiogram shows what I expected 2, which is there is free flow into the duodenum without obstruction, I will remove the T-tube on her next visit.  I suggested to the patient that her appetite and absorption of food may improve now that the T-tube was clamped and her appetite may get better and she may start gaining weight. If not we can address this with her primary care physician.

## 2012-12-28 ENCOUNTER — Inpatient Hospital Stay (HOSPITAL_COMMUNITY)
Admission: EM | Admit: 2012-12-28 | Discharge: 2013-01-03 | DRG: 391 | Disposition: A | Discharge status: Home / Self Care | Payer: Medicare Other | Attending: Internal Medicine | Admitting: Internal Medicine

## 2012-12-28 ENCOUNTER — Emergency Department (HOSPITAL_COMMUNITY): Payer: Medicare Other

## 2012-12-28 ENCOUNTER — Encounter (HOSPITAL_COMMUNITY): Payer: Self-pay | Admitting: Emergency Medicine

## 2012-12-28 ENCOUNTER — Ambulatory Visit: Payer: Medicare Other | Admitting: Cardiology

## 2012-12-28 DIAGNOSIS — K810 Acute cholecystitis: Secondary | ICD-10-CM

## 2012-12-28 DIAGNOSIS — Z9049 Acquired absence of other specified parts of digestive tract: Secondary | ICD-10-CM

## 2012-12-28 DIAGNOSIS — D509 Iron deficiency anemia, unspecified: Secondary | ICD-10-CM | POA: Diagnosis present

## 2012-12-28 DIAGNOSIS — I359 Nonrheumatic aortic valve disorder, unspecified: Secondary | ICD-10-CM | POA: Diagnosis present

## 2012-12-28 DIAGNOSIS — J45909 Unspecified asthma, uncomplicated: Secondary | ICD-10-CM | POA: Diagnosis present

## 2012-12-28 DIAGNOSIS — M797 Fibromyalgia: Secondary | ICD-10-CM

## 2012-12-28 DIAGNOSIS — G894 Chronic pain syndrome: Secondary | ICD-10-CM | POA: Diagnosis present

## 2012-12-28 DIAGNOSIS — Z8673 Personal history of transient ischemic attack (TIA), and cerebral infarction without residual deficits: Secondary | ICD-10-CM

## 2012-12-28 DIAGNOSIS — R112 Nausea with vomiting, unspecified: Secondary | ICD-10-CM

## 2012-12-28 DIAGNOSIS — G40209 Localization-related (focal) (partial) symptomatic epilepsy and epileptic syndromes with complex partial seizures, not intractable, without status epilepticus: Secondary | ICD-10-CM | POA: Diagnosis present

## 2012-12-28 DIAGNOSIS — R000 Tachycardia, unspecified: Secondary | ICD-10-CM | POA: Diagnosis present

## 2012-12-28 DIAGNOSIS — Z79899 Other long term (current) drug therapy: Secondary | ICD-10-CM

## 2012-12-28 DIAGNOSIS — E869 Volume depletion, unspecified: Secondary | ICD-10-CM

## 2012-12-28 DIAGNOSIS — D45 Polycythemia vera: Secondary | ICD-10-CM | POA: Diagnosis present

## 2012-12-28 DIAGNOSIS — E039 Hypothyroidism, unspecified: Secondary | ICD-10-CM | POA: Diagnosis present

## 2012-12-28 DIAGNOSIS — K921 Melena: Secondary | ICD-10-CM | POA: Diagnosis present

## 2012-12-28 DIAGNOSIS — E038 Other specified hypothyroidism: Secondary | ICD-10-CM | POA: Diagnosis present

## 2012-12-28 DIAGNOSIS — I2510 Atherosclerotic heart disease of native coronary artery without angina pectoris: Secondary | ICD-10-CM | POA: Diagnosis present

## 2012-12-28 DIAGNOSIS — Z7902 Long term (current) use of antithrombotics/antiplatelets: Secondary | ICD-10-CM

## 2012-12-28 DIAGNOSIS — Z7982 Long term (current) use of aspirin: Secondary | ICD-10-CM

## 2012-12-28 DIAGNOSIS — K208 Other esophagitis without bleeding: Principal | ICD-10-CM | POA: Diagnosis present

## 2012-12-28 DIAGNOSIS — D649 Anemia, unspecified: Secondary | ICD-10-CM | POA: Diagnosis present

## 2012-12-28 DIAGNOSIS — E43 Unspecified severe protein-calorie malnutrition: Secondary | ICD-10-CM | POA: Diagnosis present

## 2012-12-28 DIAGNOSIS — J40 Bronchitis, not specified as acute or chronic: Secondary | ICD-10-CM

## 2012-12-28 DIAGNOSIS — Z79890 Hormone replacement therapy (postmenopausal): Secondary | ICD-10-CM

## 2012-12-28 DIAGNOSIS — IMO0001 Reserved for inherently not codable concepts without codable children: Secondary | ICD-10-CM | POA: Diagnosis present

## 2012-12-28 DIAGNOSIS — G4700 Insomnia, unspecified: Secondary | ICD-10-CM | POA: Diagnosis present

## 2012-12-28 DIAGNOSIS — K219 Gastro-esophageal reflux disease without esophagitis: Secondary | ICD-10-CM | POA: Diagnosis present

## 2012-12-28 DIAGNOSIS — I639 Cerebral infarction, unspecified: Secondary | ICD-10-CM | POA: Diagnosis present

## 2012-12-28 DIAGNOSIS — I959 Hypotension, unspecified: Secondary | ICD-10-CM

## 2012-12-28 DIAGNOSIS — I119 Hypertensive heart disease without heart failure: Secondary | ICD-10-CM | POA: Diagnosis present

## 2012-12-28 DIAGNOSIS — E785 Hyperlipidemia, unspecified: Secondary | ICD-10-CM | POA: Diagnosis present

## 2012-12-28 DIAGNOSIS — N390 Urinary tract infection, site not specified: Secondary | ICD-10-CM | POA: Diagnosis present

## 2012-12-28 DIAGNOSIS — E860 Dehydration: Secondary | ICD-10-CM

## 2012-12-28 HISTORY — DX: Other forms of dyspnea: R06.09

## 2012-12-28 HISTORY — DX: Anxiety disorder, unspecified: F41.9

## 2012-12-28 HISTORY — DX: Dyspnea, unspecified: R06.00

## 2012-12-28 LAB — POCT I-STAT 3, VENOUS BLOOD GAS (G3P V)
Bicarbonate: 28.5 mEq/L — ABNORMAL HIGH (ref 20.0–24.0)
pCO2, Ven: 44 mmHg — ABNORMAL LOW (ref 45.0–50.0)
pH, Ven: 7.42 — ABNORMAL HIGH (ref 7.250–7.300)
pO2, Ven: 24 mmHg — CL (ref 30.0–45.0)

## 2012-12-28 LAB — CBC WITH DIFFERENTIAL/PLATELET
Basophils Absolute: 0 10*3/uL (ref 0.0–0.1)
Basophils Absolute: 0 10*3/uL (ref 0.0–0.1)
Basophils Relative: 0 % (ref 0–1)
Basophils Relative: 0 % (ref 0–1)
Eosinophils Absolute: 0 10*3/uL (ref 0.0–0.7)
Eosinophils Relative: 0 % (ref 0–5)
HCT: 16.1 % — ABNORMAL LOW (ref 36.0–46.0)
HCT: 11.8 % — ABNORMAL LOW (ref 36.0–46.0)
Lymphocytes Relative: 13 % (ref 12–46)
Lymphs Abs: 0.6 10*3/uL — ABNORMAL LOW (ref 0.7–4.0)
Lymphs Abs: 0.5 10*3/uL — ABNORMAL LOW (ref 0.7–4.0)
MCH: 16.2 pg — ABNORMAL LOW (ref 26.0–34.0)
MCHC: 25.5 g/dL — ABNORMAL LOW (ref 30.0–36.0)
MCV: 63.6 fL — ABNORMAL LOW (ref 78.0–100.0)
MCV: 63.8 fL — ABNORMAL LOW (ref 78.0–100.0)
Monocytes Relative: 7 % (ref 3–12)
Monocytes Relative: 11 % (ref 3–12)
Neutro Abs: 6.7 10*3/uL (ref 1.7–7.7)
Neutro Abs: 2.7 10*3/uL (ref 1.7–7.7)
Platelets: 242 10*3/uL (ref 150–400)
RBC: 1.85 MIL/uL — ABNORMAL LOW (ref 3.87–5.11)
RDW: 24.7 % — ABNORMAL HIGH (ref 11.5–15.5)
WBC: 7.8 10*3/uL (ref 4.0–10.5)
WBC: 3.6 10*3/uL — ABNORMAL LOW (ref 4.0–10.5)

## 2012-12-28 LAB — URINALYSIS, ROUTINE W REFLEX MICROSCOPIC
Glucose, UA: NEGATIVE mg/dL
Hgb urine dipstick: NEGATIVE
Ketones, ur: NEGATIVE mg/dL
Specific Gravity, Urine: 1.015 (ref 1.005–1.030)
pH: 7 (ref 5.0–8.0)

## 2012-12-28 LAB — POCT I-STAT, CHEM 8
BUN: 26 mg/dL — ABNORMAL HIGH (ref 6–23)
Chloride: 100 mEq/L (ref 96–112)
Creatinine, Ser: 0.9 mg/dL (ref 0.50–1.10)
Hemoglobin: 5.1 g/dL — CL (ref 12.0–15.0)
Potassium: 4.4 mEq/L (ref 3.5–5.1)
Sodium: 138 mEq/L (ref 135–145)

## 2012-12-28 LAB — CBC
Hemoglobin: 3.8 g/dL — CL (ref 12.0–15.0)
MCH: 19.4 pg — ABNORMAL LOW (ref 26.0–34.0)
MCV: 68.4 fL — ABNORMAL LOW (ref 78.0–100.0)
Platelets: 263 10*3/uL (ref 150–400)
RBC: 1.96 MIL/uL — ABNORMAL LOW (ref 3.87–5.11)
RDW: 24.8 % — ABNORMAL HIGH (ref 11.5–15.5)
WBC: 7.2 10*3/uL (ref 4.0–10.5)

## 2012-12-28 LAB — HEPATIC FUNCTION PANEL
ALT: <5 U/L (ref 0–35)
AST: 9 U/L (ref 0–37)
Albumin: 2.1 g/dL — ABNORMAL LOW (ref 3.5–5.2)
Alkaline Phosphatase: 65 U/L (ref 39–117)
Bilirubin, Direct: <0.1 mg/dL (ref 0.0–0.3)

## 2012-12-28 LAB — URINE MICROSCOPIC-ADD ON

## 2012-12-28 LAB — PREPARE RBC (CROSSMATCH)

## 2012-12-28 LAB — CG4 I-STAT (LACTIC ACID)
Lactic Acid, Venous: 2.94 mmol/L — ABNORMAL HIGH (ref 0.5–2.2)
Lactic Acid, Venous: 0.77 mmol/L (ref 0.5–2.2)

## 2012-12-28 MED ORDER — PANTOPRAZOLE SODIUM 40 MG IV SOLR
40.0000 mg | INTRAVENOUS | Status: DC
  Administered 2012-12-28 – 2012-12-30 (×2): 40 mg via INTRAVENOUS
  Filled 2012-12-28 (×5): qty 40

## 2012-12-28 MED ORDER — ENOXAPARIN SODIUM 40 MG/0.4ML ~~LOC~~ SOLN
40.0000 mg | SUBCUTANEOUS | Status: DC
  Administered 2012-12-28: 40 mg via SUBCUTANEOUS
  Filled 2012-12-28 (×2): qty 0.4

## 2012-12-28 MED ORDER — OXYCODONE HCL 5 MG PO TABS
15.0000 mg | ORAL_TABLET | Freq: Three times a day (TID) | ORAL | Status: DC | PRN
  Administered 2012-12-28 – 2013-01-02 (×6): 15 mg via ORAL
  Filled 2012-12-28 (×6): qty 3

## 2012-12-28 MED ORDER — CASCARA SAGRADA 450 MG PO CAPS
900.0000 mg | ORAL_CAPSULE | Freq: Every day | ORAL | Status: DC
Start: 2012-12-28 — End: 2012-12-28

## 2012-12-28 MED ORDER — IPRATROPIUM-ALBUTEROL 18-103 MCG/ACT IN AERO: 2.0000 | INHALATION_SPRAY | Freq: Four times a day (QID) | RESPIRATORY_TRACT | Status: DC | PRN

## 2012-12-28 MED ORDER — SENNA 8.6 MG PO TABS
1.0000 | ORAL_TABLET | Freq: Three times a day (TID) | ORAL | Status: DC
  Administered 2012-12-29 – 2013-01-03 (×15): 8.6 mg via ORAL
  Filled 2012-12-28 (×23): qty 1

## 2012-12-28 MED ORDER — THYROID 30 MG PO TABS
150.0000 mg | ORAL_TABLET | Freq: Every day | ORAL | Status: DC
  Administered 2012-12-29 – 2013-01-03 (×6): 150 mg via ORAL
  Filled 2012-12-28 (×7): qty 1

## 2012-12-28 MED ORDER — FUROSEMIDE 80 MG PO TABS
80.0000 mg | ORAL_TABLET | Freq: Every day | ORAL | Status: DC
  Filled 2012-12-28: qty 1

## 2012-12-28 MED ORDER — ZOLPIDEM TARTRATE 5 MG PO TABS
5.0000 mg | ORAL_TABLET | Freq: Every day | ORAL | Status: DC
  Administered 2012-12-28 – 2012-12-31 (×4): 5 mg via ORAL
  Filled 2012-12-28 (×4): qty 1

## 2012-12-28 MED ORDER — POTASSIUM CHLORIDE CRYS ER 20 MEQ PO TBCR
80.0000 meq | EXTENDED_RELEASE_TABLET | Freq: Every day | ORAL | Status: DC
  Filled 2012-12-28: qty 4

## 2012-12-28 MED ORDER — SODIUM CHLORIDE 0.9 % IV SOLN
INTRAVENOUS | Status: AC
  Administered 2012-12-28: 09:00:00 via INTRAVENOUS

## 2012-12-28 MED ORDER — CYCLOBENZAPRINE HCL 10 MG PO TABS: 10.0000 mg | ORAL_TABLET | ORAL | Status: DC

## 2012-12-28 MED ORDER — NITROGLYCERIN 0.4 MG SL SUBL: 0.4000 mg | SUBLINGUAL_TABLET | SUBLINGUAL | Status: DC | PRN

## 2012-12-28 MED ORDER — ONDANSETRON HCL 4 MG PO TABS: 4.0000 mg | ORAL_TABLET | Freq: Four times a day (QID) | ORAL | Status: DC | PRN

## 2012-12-28 MED ORDER — SENNOSIDES 8.6 MG PO TABS: 1.0000 | ORAL_TABLET | Freq: Three times a day (TID) | ORAL | Status: DC

## 2012-12-28 MED ORDER — DIPHENHYDRAMINE HCL 25 MG PO TABS
50.0000 mg | ORAL_TABLET | Freq: Every day | ORAL | Status: DC
  Filled 2012-12-28: qty 2

## 2012-12-28 MED ORDER — SODIUM CHLORIDE 0.9 % IJ SOLN
3.0000 mL | Freq: Two times a day (BID) | INTRAMUSCULAR | Status: DC
  Administered 2012-12-29 – 2013-01-02 (×4): 3 mL via INTRAVENOUS

## 2012-12-28 MED ORDER — CLOPIDOGREL BISULFATE 75 MG PO TABS
75.0000 mg | ORAL_TABLET | Freq: Every day | ORAL | Status: DC
  Administered 2012-12-29 – 2013-01-03 (×6): 75 mg via ORAL
  Filled 2012-12-28 (×7): qty 1

## 2012-12-28 MED ORDER — ONDANSETRON HCL 4 MG/2ML IJ SOLN
4.0000 mg | Freq: Three times a day (TID) | INTRAMUSCULAR | Status: DC | PRN
  Administered 2012-12-28: 4 mg via INTRAVENOUS
  Filled 2012-12-28 (×2): qty 2

## 2012-12-28 MED ORDER — DOCUSATE SODIUM 100 MG PO CAPS
100.0000 mg | ORAL_CAPSULE | Freq: Three times a day (TID) | ORAL | Status: DC
  Administered 2012-12-28 – 2013-01-03 (×16): 100 mg via ORAL
  Filled 2012-12-28 (×14): qty 1

## 2012-12-28 MED ORDER — ONDANSETRON HCL 4 MG/2ML IJ SOLN
4.0000 mg | Freq: Four times a day (QID) | INTRAMUSCULAR | Status: DC | PRN
  Administered 2012-12-31 – 2013-01-02 (×2): 4 mg via INTRAVENOUS
  Filled 2012-12-28 (×2): qty 2

## 2012-12-28 MED ORDER — DIPHENHYDRAMINE HCL 25 MG PO CAPS
50.0000 mg | ORAL_CAPSULE | Freq: Three times a day (TID) | ORAL | Status: DC | PRN
  Administered 2013-01-01: 50 mg via ORAL
  Filled 2012-12-28: qty 2

## 2012-12-28 MED ORDER — BISOPROLOL FUMARATE 10 MG PO TABS
10.0000 mg | ORAL_TABLET | Freq: Every day | ORAL | Status: DC
  Administered 2012-12-28: 10 mg via ORAL
  Filled 2012-12-28 (×4): qty 1

## 2012-12-28 MED ORDER — OXYCODONE HCL 20 MG PO TB12: 20.0000 mg | ORAL_TABLET | Freq: Three times a day (TID) | ORAL | Status: DC

## 2012-12-28 MED ORDER — ACETAMINOPHEN 650 MG RE SUPP: 650.0000 mg | Freq: Four times a day (QID) | RECTAL | Status: DC | PRN

## 2012-12-28 MED ORDER — SODIUM CHLORIDE 0.9 % IV BOLUS (SEPSIS)
1000.0000 mL | Freq: Once | INTRAVENOUS | Status: AC
  Administered 2012-12-28: 1000 mL via INTRAVENOUS

## 2012-12-28 MED ORDER — CIPROFLOXACIN IN D5W 400 MG/200ML IV SOLN
400.0000 mg | Freq: Two times a day (BID) | INTRAVENOUS | Status: DC
  Administered 2012-12-28 – 2013-01-01 (×8): 400 mg via INTRAVENOUS
  Filled 2012-12-28 (×10): qty 200

## 2012-12-28 MED ORDER — CYCLOBENZAPRINE HCL 10 MG PO TABS
10.0000 mg | ORAL_TABLET | Freq: Every day | ORAL | Status: DC
  Administered 2012-12-28 – 2013-01-02 (×6): 10 mg via ORAL
  Filled 2012-12-28 (×10): qty 1

## 2012-12-28 MED ORDER — OXYCODONE HCL ER 10 MG PO T12A
20.0000 mg | EXTENDED_RELEASE_TABLET | Freq: Three times a day (TID) | ORAL | Status: DC
  Administered 2012-12-28 – 2013-01-03 (×19): 20 mg via ORAL
  Filled 2012-12-28 (×19): qty 2

## 2012-12-28 MED ORDER — POTASSIUM CHLORIDE ER 8 MEQ PO CPCR: 80.0000 meq | ORAL_CAPSULE | Freq: Every day | ORAL | Status: DC

## 2012-12-28 MED ORDER — ALPRAZOLAM 0.5 MG PO TABS
0.5000 mg | ORAL_TABLET | Freq: Every day | ORAL | Status: DC
  Administered 2012-12-28 – 2013-01-02 (×6): 0.5 mg via ORAL
  Filled 2012-12-28 (×6): qty 1

## 2012-12-28 MED ORDER — LEVETIRACETAM 500 MG PO TABS
500.0000 mg | ORAL_TABLET | Freq: Two times a day (BID) | ORAL | Status: DC
  Administered 2012-12-28 – 2013-01-03 (×13): 500 mg via ORAL
  Filled 2012-12-28 (×15): qty 1

## 2012-12-28 MED ORDER — SODIUM CHLORIDE 0.9 % IJ SOLN
10.0000 mL | INTRAMUSCULAR | Status: DC | PRN
  Administered 2013-01-01 – 2013-01-03 (×2): 10 mL

## 2012-12-28 MED ORDER — LORATADINE 10 MG PO TABS
10.0000 mg | ORAL_TABLET | Freq: Every day | ORAL | Status: DC
  Administered 2012-12-28 – 2013-01-03 (×7): 10 mg via ORAL
  Filled 2012-12-28 (×7): qty 1

## 2012-12-28 MED ORDER — TEMAZEPAM 15 MG PO CAPS: 30.0000 mg | ORAL_CAPSULE | Freq: Every day | ORAL | Status: DC

## 2012-12-28 MED ORDER — SENNOSIDES-DOCUSATE SODIUM 8.6-50 MG PO TABS
2.0000 | ORAL_TABLET | Freq: Every day | ORAL | Status: DC
  Administered 2012-12-28 – 2013-01-02 (×6): 2 via ORAL
  Filled 2012-12-28 (×6): qty 2

## 2012-12-28 MED ORDER — MINERAL OIL PO OIL
60.0000 mL | TOPICAL_OIL | Freq: Every day | ORAL | Status: DC
  Administered 2012-12-28 – 2013-01-02 (×6): 60 mL via ORAL
  Filled 2012-12-28 (×8): qty 60

## 2012-12-28 MED ORDER — DIPHENHYDRAMINE HCL 25 MG PO TABS
50.0000 mg | ORAL_TABLET | Freq: Three times a day (TID) | ORAL | Status: DC | PRN
  Filled 2012-12-28: qty 2

## 2012-12-28 MED ORDER — ASPIRIN 325 MG PO TABS
325.0000 mg | ORAL_TABLET | Freq: Every day | ORAL | Status: DC
  Administered 2012-12-29 – 2013-01-03 (×6): 325 mg via ORAL
  Filled 2012-12-28 (×7): qty 1

## 2012-12-28 MED ORDER — CYCLOBENZAPRINE HCL 10 MG PO TABS
10.0000 mg | ORAL_TABLET | Freq: Two times a day (BID) | ORAL | Status: DC | PRN
  Administered 2013-01-01 – 2013-01-02 (×2): 10 mg via ORAL
  Filled 2012-12-28 (×2): qty 1

## 2012-12-28 MED ORDER — ESTROGENS CONJUGATED 0.625 MG PO TABS
0.6250 mg | ORAL_TABLET | Freq: Every day | ORAL | Status: DC
  Administered 2012-12-29 – 2013-01-03 (×6): 0.625 mg via ORAL
  Filled 2012-12-28 (×7): qty 1

## 2012-12-28 MED ORDER — POTASSIUM CHLORIDE IN NACL 20-0.9 MEQ/L-% IV SOLN
INTRAVENOUS | Status: DC
  Administered 2012-12-28: 17:00:00 via INTRAVENOUS
  Filled 2012-12-28 (×2): qty 1000

## 2012-12-28 MED ORDER — ONDANSETRON HCL 4 MG/2ML IJ SOLN
4.0000 mg | Freq: Once | INTRAMUSCULAR | Status: AC
  Administered 2012-12-28: 4 mg via INTRAVENOUS
  Filled 2012-12-28: qty 2

## 2012-12-28 MED ORDER — ACETAMINOPHEN 325 MG PO TABS: 650.0000 mg | ORAL_TABLET | Freq: Four times a day (QID) | ORAL | Status: DC | PRN

## 2012-12-28 NOTE — ED Provider Notes (Addendum)
CSN: [id number]     Arrival date & time 12/28/12  4098 History   First MD Initiated Contact with Patient 12/28/12 [phone]     Chief Complaint  Patient presents with  . Vomiting    (Consider location/radiation/quality/duration/timing/severity/associated sxs/prior Treatment) HPI This is a 67 year old female with multiple medical problems status post cholecystectomy about a month ago. She had a drain removed by her surgeon yesterday. She has a biliary duct drain remains in place but is clamped off. 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   She is here with nausea and vomiting since yesterday evening. She has vomited multiple times. She characterizes the vomiting as bilious. She has had loose stools with this is a chronic issue. She has pain in her right upper quadrant at the site of her drain but these are not acutely different. She has also been having pain in her left flank radiating to her left upper quadrant that is worse with breathing. She feels somewhat short of breath but attributes this to the efforts required to come to the hospital. She is not aware of having a fever. She does feel weak.  Past Medical History  Diagnosis Date  . 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 Fibromyalgia     chronic pain syndrome  . Postmenopausal state     on hormone replacement therapy  . Aortic valve sclerosis     by echocardiogram 12/04/2009  . Hyperlipidemia   . 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 GERD (gastroesophageal reflux disease)   . Difficult intubation     grade 3 airway  . PAC (premature atrial contraction)   . Hypertension   . Asthma   . Seizures     only seizure was 10/14/12;takes Keppra daily  . Chronic constipation     takes Mineral Oil,Juice,Enema(prn),and Miralax(Prn) and Cascara nightly  . Insomnia     takes restoril nightly and Xanax  . Hypothyroidism   . Seasonal allergies      takes Allegra in am and Benadryl at night  . Occlusion and stenosis of carotid artery without mention of cerebral infarction 10/26/2012  . 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 Acute cholecystitis 10/07/2012    s/p pec drain due to recent CVA, pending chole 11/2012   Past Surgical History  Procedure Laterality Date  . Endarterectomy Left 10/10/2012    Procedure: ENDARTERECTOMY CAROTID- LEFT NECK WITH GREATER SAPHENOUS VEIN PATCH LEFT LEG;  Surgeon: Sherren Kerns, MD;  Location: [facility] OR;  Service: Vascular;  Laterality: Left;  . Carotid endarterectomy Left 10-10-12    cea  . Abdominal hysterectomy  1996  . Tonsillectomy      age 7  . Nasal septum surgery      late 70's  . Colonoscopy    . Insertion of drain  10/02/12    right low abdomen and draining pus  . Picc line placed      10/15/12  . Cholecystectomy N/A 11/29/2012    Procedure: ATTEMPTED LAPAROSCOPIC CHOLECYSTECTOMY ;  Surgeon: Cherylynn Ridges, MD;  Location: [facility] OR;  Service: General;  Laterality: N/A;  . Cholecystectomy N/A 11/29/2012    Procedure: CHOLECYSTECTOMY, COMMON BILE DUCT EXPLORATION, T-TUBE PLACEMENT;  Surgeon: Cherylynn Ridges, MD;  Location: [facility] OR;  Service: General;  Laterality: N/A;   Family History  Problem Relation Age of Onset  . Heart attack Father   . Coronary  artery disease Mother     had aortic valve replacement   History  Substance Use Topics  . Smoking status: Never Smoker   . Smokeless tobacco: Never Used  . Alcohol Use: No   OB History   Grav Para Term Preterm Abortions TAB SAB Ect Mult Living                 Review of Systems  All other systems reviewed and are negative.    Allergies  Codeine; Synthroid; Calcium channel blockers; and Penicillins  Home Medications   Current Outpatient Rx  Name  Route  Sig  Dispense  Refill  . albuterol-ipratropium (COMBIVENT) 18-103 MCG/ACT inhaler   Inhalation   Inhale 2 puffs into the lungs every 6 (six) hours as needed for wheezing.         Marland Kitchen  ALPRAZolam (XANAX) 0.5 MG tablet   Oral   Take 0.5 mg by mouth at bedtime.         Marland Kitchen aspirin 325 MG tablet   Oral   Take 325 mg by mouth daily after breakfast.          . bisoprolol (ZEBETA) 10 MG tablet   Oral   Take 10 mg by mouth daily after breakfast.          . Cascara [place] 450 MG CAPS   Oral   Take 900 mg by mouth at bedtime.         . cholecalciferol (VITAMIN D) 1000 UNITS tablet   Oral   Take 3,000 Units by mouth daily after breakfast.          . clopidogrel (PLAVIX) 75 MG tablet   Oral   Take 75 mg by mouth daily after breakfast.          . cyanocobalamin (,VITAMIN B-12,) 1000 MCG/ML injection   Intramuscular   Inject 1,000 mcg into the muscle as needed (for energy).         . cyclobenzaprine (FLEXERIL) 10 MG tablet   Oral   Take 10 mg by mouth See admin instructions. Patient make take 10 mg  twice a day as needed for spasms. Patient takes 10 mg at bedtime each day.         . diphenhydrAMINE (BENADRYL) 25 MG tablet   Oral   Take 50 mg by mouth See admin instructions. Patient may take 50 mg three times a day as needed. Patient takes 50 mg at bedtime each day.         . docusate sodium (COLACE) 100 MG capsule   Oral   Take 100 mg by mouth 3 (three) times daily with meals.          Marland Kitchen estrogens, conjugated, (PREMARIN) 0.625 MG tablet   Oral   Take 0.625 mg by mouth daily after breakfast.          . famotidine (PEPCID) 20 MG tablet   Oral   Take 20 mg by mouth 2 (two) times daily before a meal.         . fexofenadine (ALLEGRA) 180 MG tablet   Oral   Take 180 mg by mouth daily after breakfast.          . fluoxymesterone (ANDROXY) 10 MG tablet   Oral   Take 2.5 mg by mouth daily as needed (before sexual activity).          . furosemide (LASIX) 40 MG tablet   Oral   Take 80 mg by mouth daily  after breakfast.          . levETIRAcetam (KEPPRA) 500 MG tablet      TAKE 1 TABLET TWICE A DAY   60 tablet   3   . mineral oil  liquid   Oral   Take 60 mLs by mouth at bedtime. With Juice         . nitroGLYCERIN (NITROSTAT) 0.4 MG SL tablet   Sublingual   Place 0.4 mg under the tongue every 5 (five) minutes as needed for chest pain.         Marland Kitchen oxyCODONE (OXYCONTIN) 10 MG 12 hr tablet   Oral   Take 1 tablet (10 mg total) by mouth every 12 (twelve) hours as needed (for breakthrough pain). Brand name medically necessary.   60 tablet   0   . oxyCODONE (OXYCONTIN) 20 MG 12 hr tablet   Oral   Take 1 tablet (20 mg total) by mouth every 8 (eight) hours. Brand name medically necessary.   90 tablet   0   . oxyCODONE (ROXICODONE) 15 MG immediate release tablet   Oral   Take 1 tablet (15 mg total) by mouth every 8 (eight) hours as needed (for breakthrough pain). Brand name medically necessary.   90 tablet   0   . potassium chloride (MICRO-K) 10 MEQ CR capsule   Oral   Take 80 mEq by mouth daily after breakfast.         . Sennosides (SENNA LAX PO)   Oral   Take 1 tablet by mouth 3 (three) times daily with meals.          . temazepam (RESTORIL) 30 MG capsule   Oral   Take 30 mg by mouth at bedtime.          Marland Kitchen thyroid (ARMOUR) 60 MG tablet   Oral   Take 150 mg by mouth daily after breakfast. Levothyroxine is not effective. Takes only armour thyroid.          BP 112/60  Pulse 99  Temp(Src) 99 F (37.2 C) (Rectal)  Resp 18  SpO2 100%  Physical Exam General: Well-developed, well-nourished female in no acute distress; appearance consistent with age of record HENT: normocephalic; atraumatic Eyes: pupils equal, round and reactive to light; extraocular muscles intact; conjunctival pallor Neck: supple Heart: regular rate and rhythm; tachycardic Lungs: clear to auscultation bilaterally; tachypnea Abdomen: soft; nondistended; right upper quadrant tenderness, tenderness at the site of right abdominal drain; no masses or hepatosplenomegaly; bowel sounds present GU: No CVA tenderness Extremities: No  deformity; full range of motion; pulses weak Neurologic: Awake, alert and oriented; motor function intact in all extremities and symmetric; no facial droop Skin: Warm and dry; profoundly pale Psychiatric: Flat affect    ED Course  Procedures (including critical care time)  MDM   Nursing notes and vitals signs, including pulse oximetry, reviewed.  Summary of this visit's results, reviewed by myself:  Labs:  Results for orders placed during the hospital encounter of 12/28/12 (from the past 24 hour(s))  CBC WITH DIFFERENTIAL     Status: Abnormal   Collection Time    12/28/12  4:24 AM      Result Value Range   WBC 7.8  4.0 - 10.5 K/uL   RBC 2.53 (*) 3.87 - 5.11 MIL/uL   Hemoglobin 4.1 (*) 12.0 - 15.0 g/dL   HCT 10.2 (*) 72.5 - 36.6 %   MCV 63.6 (*) 78.0 - 100.0 fL   MCH 16.2 (*)  26.0 - 34.0 pg   MCHC 25.5 (*) 30.0 - 36.0 g/dL   RDW 16.1 (*) 09.6 - 04.5 %   Platelets 520 (*) 150 - 400 K/uL   Neutrophils Relative % 85 (*) 43 - 77 %   Lymphocytes Relative 8 (*) 12 - 46 %   Monocytes Relative 7  3 - 12 %   Eosinophils Relative 0  0 - 5 %   Basophils Relative 0  0 - 1 %   Neutro Abs 6.7  1.7 - 7.7 K/uL   Lymphs Abs 0.6 (*) 0.7 - 4.0 K/uL   Monocytes Absolute 0.5  0.1 - 1.0 K/uL   Eosinophils Absolute 0.0  0.0 - 0.7 K/uL   Basophils Absolute 0.0  0.0 - 0.1 K/uL   RBC Morphology POLYCHROMASIA PRESENT     WBC Morphology ATYPICAL LYMPHOCYTES     Smear Review LARGE PLATELETS PRESENT    POCT I-STAT 3, BLOOD GAS (G3P V)     Status: Abnormal   Collection Time    12/28/12  4:36 AM      Result Value Range   pH, Ven 7.420 (*) 7.250 - 7.300   pCO2, Ven 44.0 (*) 45.0 - 50.0 mmHg   pO2, Ven 24.0 (*) 30.0 - 45.0 mmHg   Bicarbonate 28.5 (*) 20.0 - 24.0 mEq/L   TCO2 30  0 - 100 mmol/L   O2 Saturation 42.0     Acid-Base Excess 4.0 (*) 0.0 - 2.0 mmol/L   Sample type VENOUS     Comment NOTIFIED PHYSICIAN    POCT I-STAT, CHEM 8     Status: Abnormal   Collection Time    12/28/12  4:37 AM       Result Value Range   Sodium 138  135 - 145 mEq/L   Potassium 4.4  3.5 - 5.1 mEq/L   Chloride 100  96 - 112 mEq/L   BUN 26 (*) 6 - 23 mg/dL   Creatinine, Ser 4.09  0.50 - 1.10 mg/dL   Glucose, Bld 811 (*) 70 - 99 mg/dL   Calcium, Ion 9.14 (*) 1.13 - 1.30 mmol/L   TCO2 27  0 - 100 mmol/L   Hemoglobin 5.1 (*) 12.0 - 15.0 g/dL   HCT 78.2 (*) 95.6 - 21.3 %   Comment NOTIFIED PHYSICIAN    CG4 I-STAT (LACTIC ACID)     Status: Abnormal   Collection Time    12/28/12  4:38 AM      Result Value Range   Lactic Acid, Venous 2.94 (*) 0.5 - 2.2 mmol/L    Imaging Studies: Dg Chest Port 1 View  12/28/2012   CLINICAL DATA:  Nausea, weakness.  EXAM: PORTABLE CHEST - 1 VIEW  COMPARISON:  11/21/2012  FINDINGS: Left PICC line remains in place, unchanged. Heart and mediastinal contours are within normal limits. No focal opacities or effusions. No acute bony abnormality.  IMPRESSION: No active disease.   Electronically Signed   By: Charlett Nose M.D.   On: 12/28/2012 04:11    EKG Interpretation:  Date & Time: 12/28/2012 3:17 AM  Rate: 129  Rhythm: sinus tachycardia  QRS Axis: normal  Intervals: normal  ST/T Wave abnormalities: normal  Conduction Disutrbances:none  Narrative Interpretation:   Old EKG Reviewed: Rate is faster  7:33 AM Patient feels better, tachycardia resolved after 2 L normal saline bolus. She is able to tolerate fluids without emesis. We will recheck her CBC as we anticipate her hemoglobin will have dropped further following hydration.  We will also recheck a lactate. At the request of the admitting hospitalist (Dr. Waymon Amato) we will check LFTs and lipase. Dr. Corliss Skains of general surgery consulted also.       Hanley Seamen, MD 12/28/12 0703  Hanley Seamen, MD 12/28/12 0865  Hanley Seamen, MD 12/28/12 [phone]

## 2012-12-28 NOTE — Progress Notes (Signed)
Discussed with Dr. Lindie Spruce. Will follow during admission.  [name]. Sarah Skains, MD, [facility]  General/ Trauma Surgery  12/28/2012 8:22 AM

## 2012-12-28 NOTE — ED Notes (Addendum)
Dr. Molpus in to see pt 

## 2012-12-28 NOTE — ED Notes (Signed)
Patient states nausea is worsening.  Denies pain 0/10 at rest and with movement nausea worseing

## 2012-12-28 NOTE — ED Notes (Signed)
Lactic acid results shown to Dr. Pickering 

## 2012-12-28 NOTE — Progress Notes (Signed)
UA suggestive of UTI: started IV Cipro. Repeat CBC stat and no transfusions if Hb > 4 gm/dl - as discussed with Dr. [name] this morning.  [name] 6:40 PM

## 2012-12-28 NOTE — ED Provider Notes (Signed)
D/w Dr Lindie Spruce. Recommended no CT at this time. Possible transfusion of 1 unit, but suggested consult to Dr Dicie Beam R. Rubin Payor, MD 12/28/12 [phone]

## 2012-12-28 NOTE — ED Notes (Signed)
Surgery PA at bedside to eval pt.

## 2012-12-28 NOTE — ED Notes (Addendum)
Pt. reports emesis / diaphoresis onset last night , denies diarrhea , also reports generalized weakness, pale at arrival.

## 2012-12-28 NOTE — ED Notes (Signed)
Shown DR.MOLPUS MD. Devona Konig GAS RESULTS

## 2012-12-28 NOTE — Progress Notes (Signed)
Pt hemoglobin 3.8 and MD notified with orders to give one unit of packed red blood cells. Pt informed to transfusion to be given and pt refused and stated "I will take the blood in the am. I don't want to be disturbed since I am sleeping soundly" MD notified of patient"s decision.

## 2012-12-28 NOTE — H&P (Addendum)
[facility] History and Physical  BENITA BOONSTRA ZOX:[id number] DOB: 09/04/1945 DOA: 12/28/2012  Referring physician: Dr. Read Drivers, EDP PCP: [name], MD  Outpatient Specialists:  1. Surgery: Dr. Lindie Spruce 2. Oncology: Dr. Arlan Organ  Chief Complaint: Nausea and vomiting  HPI: [name] is a 67 y.o. female with extensive past medical and surgical history, recently underwent open cholecystectomy with common duct exploration and placement of a JP drain and T-tube. Past medical history includes but not limited to polycythemia vera (hematologist apparently aims to keep hemoglobin between 5-6 g/dL, a recent PRBC transfusion x1, phlebotomies, of fibromyalgia and chronic pain syndrome, hyperlipidemia, CVA, GERD, hypertension, seizure, hypothyroidism and a left upper arm PICC line. She was seen by the surgeons on 12/27/2012. Her appetite has been quite poor since the surgery with intermittent nausea and vomiting. She has lost 8-10 pounds over the last 4 weeks. Overall she was doing reasonably well and her JP drain was removed yesterday. Since last night, patient started having several episodes of nausea and vomiting. Emesis consisted of food and bilious material. There was no coffee grounds or blood. Patient had normal BM yesterday. No diarrhea or constipation. Patient has had chronic right upper quadrant intermittent pain since the surgery which has not changed. She did complain of some left flank pain which is only present when she takes a deep breath or during vomiting episodes. She has urinary incontinence but denies dysuria or urinary frequency. She denies fever or chills. She had significant diaphoresis during the emesis episodes. No history of loss of consciousness or falls. No history of eating anything unusual or sickly contacts. In the ED, patient was hypotensive, tachycardic, hemoglobin 4.1. She was bolused with 2 L of IV fluids with resolution of her hypotension and improvement in  tachycardia. Patient has not had any further episodes of vomiting for the last 2-3 hours. Last seizure was in mid July 2014. Hospitalist admission was requested.   Review of Systems: All systems reviewed and apart from history of presenting illness, are negative  Past Medical History  Diagnosis Date  . 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 Fibromyalgia     chronic pain syndrome  . Postmenopausal state     on hormone replacement therapy  . Aortic valve sclerosis     by echocardiogram 12/04/2009  . Hyperlipidemia   . 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 GERD (gastroesophageal reflux disease)   . Difficult intubation     grade 3 airway  . PAC (premature atrial contraction)   . Hypertension   . Asthma   . Seizures     only seizure was 10/14/12;takes Keppra daily  . Chronic constipation     takes Mineral Oil,Juice,Enema(prn),and Miralax(Prn) and Cascara nightly  . Insomnia     takes restoril nightly and Xanax  . Hypothyroidism   . Seasonal allergies     takes Allegra in am and Benadryl at night  . Occlusion and stenosis of carotid artery without mention of cerebral infarction 10/26/2012  . 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 Acute cholecystitis 10/07/2012    s/p pec drain due to recent CVA, pending chole 11/2012   Past Surgical History  Procedure Laterality Date  . Endarterectomy Left 10/10/2012    Procedure: ENDARTERECTOMY CAROTID- LEFT NECK WITH GREATER SAPHENOUS VEIN PATCH LEFT LEG;  Surgeon: Sherren Kerns, MD;  Location: [facility] OR;  Service: Vascular;  Laterality: Left;  . Carotid  endarterectomy Left 10-10-12    cea  . Abdominal hysterectomy  1996  . Tonsillectomy      age 34  . Nasal septum surgery      late 70's  . Colonoscopy    . Insertion of drain  10/02/12    right low abdomen and draining pus  . Picc line placed      10/15/12  . Cholecystectomy N/A 11/29/2012    Procedure: ATTEMPTED LAPAROSCOPIC  CHOLECYSTECTOMY ;  Surgeon: Cherylynn Ridges, MD;  Location: [facility] OR;  Service: General;  Laterality: N/A;  . Cholecystectomy N/A 11/29/2012    Procedure: CHOLECYSTECTOMY, COMMON BILE DUCT EXPLORATION, T-TUBE PLACEMENT;  Surgeon: Cherylynn Ridges, MD;  Location: [facility] OR;  Service: General;  Laterality: N/A;   Social History:  reports that she has never smoked. She has never used smokeless tobacco. She reports that she does not drink alcohol or use illicit drugs. Married. Independent of activities of daily living.  Allergies  Allergen Reactions  . Codeine Nausea Only  . Synthroid [Levothyroxine Sodium] Other (See Comments)    Not effective. Causes excessive sleepiness. Takes armour thyroid  . Calcium Channel Blockers Palpitations  . Penicillins Rash    Family History  Problem Relation Age of Onset  . Heart attack Father   . Coronary artery disease Mother     had aortic valve replacement    Prior to Admission medications   Medication Sig Start Date End Date Taking? Authorizing Provider  albuterol-ipratropium (COMBIVENT) 18-103 MCG/ACT inhaler Inhale 2 puffs into the lungs every 6 (six) hours as needed for wheezing.   Yes Historical Provider, MD  ALPRAZolam [name]) 0.5 MG tablet Take 0.5 mg by mouth at bedtime. 06/27/12  Yes Josph Macho, MD  aspirin 325 MG tablet Take 325 mg by mouth daily after breakfast.    Yes Historical Provider, MD  bisoprolol (ZEBETA) 10 MG tablet Take 10 mg by mouth daily after breakfast.    Yes Historical Provider, MD  Cascara [place] 450 MG CAPS Take 900 mg by mouth at bedtime.   Yes Historical Provider, MD  cholecalciferol (VITAMIN D) 1000 UNITS tablet Take 3,000 Units by mouth daily after breakfast.    Yes Historical Provider, MD  clopidogrel (PLAVIX) 75 MG tablet Take 75 mg by mouth daily after breakfast.    Yes Historical Provider, MD  cyanocobalamin (,VITAMIN B-12,) 1000 MCG/ML injection Inject 1,000 mcg into the muscle as needed (for energy).   Yes Historical  Provider, MD  cyclobenzaprine (FLEXERIL) 10 MG tablet Take 10 mg by mouth See admin instructions. Patient make take 10 mg  twice a day as needed for spasms. Patient takes 10 mg at bedtime each day.   Yes Historical Provider, MD  diphenhydrAMINE (BENADRYL) 25 MG tablet Take 50 mg by mouth See admin instructions. Patient may take 50 mg three times a day as needed. Patient takes 50 mg at bedtime each day.   Yes Historical Provider, MD  docusate sodium (COLACE) 100 MG capsule Take 100 mg by mouth 3 (three) times daily with meals.    Yes Historical Provider, MD  estrogens, conjugated, (PREMARIN) 0.625 MG tablet Take 0.625 mg by mouth daily after breakfast.    Yes Historical Provider, MD  famotidine (PEPCID) 20 MG tablet Take 20 mg by mouth 2 (two) times daily before a meal.   Yes Historical Provider, MD  fexofenadine (ALLEGRA) 180 MG tablet Take 180 mg by mouth daily after breakfast.    Yes Historical Provider, MD  fluoxymesterone (  ANDROXY) 10 MG tablet Take 2.5 mg by mouth daily as needed (before sexual activity).    Yes Historical Provider, MD  furosemide (LASIX) 40 MG tablet Take 80 mg by mouth daily after breakfast.  10/12/12  Yes Ripudeep K Rai, MD  levETIRAcetam (KEPPRA) 500 MG tablet TAKE 1 TABLET TWICE A DAY 12/25/12  Yes Micki Riley, MD  mineral oil liquid Take 60 mLs by mouth at bedtime. With Juice   Yes Historical Provider, MD  nitroGLYCERIN (NITROSTAT) 0.4 MG SL tablet Place 0.4 mg under the tongue every 5 (five) minutes as needed for chest pain.   Yes Historical Provider, MD  oxyCODONE (OXYCONTIN) 10 MG 12 hr tablet Take 1 tablet (10 mg total) by mouth every 12 (twelve) hours as needed (for breakthrough pain). Brand name medically necessary. 12/20/12  Yes Josph Macho, MD  oxyCODONE (OXYCONTIN) 20 MG 12 hr tablet Take 1 tablet (20 mg total) by mouth every 8 (eight) hours. Brand name medically necessary. 12/20/12  Yes Josph Macho, MD  oxyCODONE (ROXICODONE) 15 MG immediate release tablet  Take 1 tablet (15 mg total) by mouth every 8 (eight) hours as needed (for breakthrough pain). Brand name medically necessary. 12/20/12  Yes Josph Macho, MD  potassium chloride (MICRO-K) 10 MEQ CR capsule Take 80 mEq by mouth daily after breakfast.   Yes Historical Provider, MD  Sennosides (SENNA LAX PO) Take 1 tablet by mouth 3 (three) times daily with meals.    Yes Historical Provider, MD  temazepam (RESTORIL) 30 MG capsule Take 30 mg by mouth at bedtime.    Yes Historical Provider, MD  thyroid (ARMOUR) 60 MG tablet Take 150 mg by mouth daily after breakfast. Levothyroxine is not effective. Takes only armour thyroid.   Yes Historical Provider, MD   Physical Exam: Filed Vitals:   12/28/12 0630 12/28/12 0645 12/28/12 0700 12/28/12 0715  BP: 119/59 123/59 112/60 121/60  Pulse: 97 98 99   Temp:      TempSrc:      Resp: 17 16 18 16   SpO2: 100% 100% 100%      General exam: Moderately built and chronically ill and frail looking female patient, lying comfortably supine on the gurney in no obvious distress.  Head, eyes and ENT: Nontraumatic and normocephalic. Pupils equally reacting to light and accommodation. Oral mucosa dry. Pallor + +  Neck: Supple. No JVD, carotid bruit or thyromegaly.  Lymphatics: No lymphadenopathy.  Respiratory system: Clear to auscultation. No increased work of breathing.  Cardiovascular system: S1 and S2 heard, RRR. No JVD, murmurs, gallops, clicks or pedal edema. Telemetry: Sinus tachycardia in the low 100s.  Gastrointestinal system: Abdomen is nondistended, soft and nontender. Normal bowel sounds heard. No organomegaly or masses appreciated. Dressing of her right upper quadrant/T-tube clean and dry.  Central nervous system: Alert and oriented. No focal neurological deficits.  Extremities: Symmetric 5 x 5 power. Peripheral pulses symmetrically felt. PICC line left upper arm that site appears clean and dry  Skin: No rashes or acute findings.  Musculoskeletal  system: Negative exam.  Psychiatry: Pleasant and cooperative.   Labs on Admission:  Basic Metabolic Panel:  Recent Labs Lab 12/28/12 0437  NA 138  K 4.4  CL 100  GLUCOSE 183*  BUN 26*  CREATININE 0.90   Liver Function Tests: No results found for this basename: AST, ALT, ALKPHOS, BILITOT, PROT, ALBUMIN,  in the last 168 hours  Recent Labs Lab 12/28/12 0740  LIPASE 18   No results  found for this basename: AMMONIA,  in the last 168 hours CBC:  Recent Labs Lab 12/28/12 0424 12/28/12 0437 12/28/12 0740  WBC 7.8  --  PENDING  NEUTROABS 6.7  --  PENDING  HGB 4.1* 5.1* 3.0*  HCT 16.1* 15.0* 11.8*  MCV 63.6*  --  63.8*  PLT 520*  --  PENDING   Cardiac Enzymes: No results found for this basename: CKTOTAL, CKMB, CKMBINDEX, TROPONINI,  in the last 168 hours  BNP (last 3 results)  Recent Labs  10/05/12 0455  PROBNP 2326.0*   CBG: No results found for this basename: GLUCAP,  in the last 168 hours  Radiological Exams on Admission: Dg Chest Port 1 View  12/28/2012   CLINICAL DATA:  Nausea, weakness.  EXAM: PORTABLE CHEST - 1 VIEW  COMPARISON:  11/21/2012  FINDINGS: Left PICC line remains in place, unchanged. Heart and mediastinal contours are within normal limits. No focal opacities or effusions. No acute bony abnormality.  IMPRESSION: No active disease.   Electronically Signed   By: Charlett Nose M.D.   On: 12/28/2012 04:11    EKG: Independently reviewed. Sinus tachycardia at 129 beats per minute otherwise no acute changes.  Assessment/Plan Principal Problem:   Nausea and vomiting Active Problems:   Benign hypertensive heart disease without heart failure   Hypothyroidism   Polycythemia vera(238.4)   Fibromyalgia   CVA (cerebral infarction)   Complex partial seizure disorder   Anemia   Dehydration   Hypotension   Status post cholecystectomy   Intractable nausea and vomiting - Unclear etiology. - Abdominal exam does not have any acute features. Although it  started after removal of her JP drain yesterday, less likely to be the cause of her symptoms. J-tube has been clamped for a week. - DD: Rule out UTI, acute viral GE, gastroparesis, ileus - Follow urine culture results - Trial of clear liquids - Continue brief IV fluids and antiemetics - IV PPI  Dehydration  - secondary to nausea, vomiting and poor oral intake - Brief IV fluids.  Hypotension - Secondary to dehydration - Improved after IV fluids  Microcytic anemia/polycythemia Vera - Patient usually gets phlebotomies - Current hemoglobin low of 3 g/dL. - Discussed with patient's primary hematologist who recommends transfusing 1 unit of PRBC and keep hemoglobin >4 g per DL and not more given her hyperviscosity from polycythemia.  - No overt bleeding.  GERD - IV PPI  History of seizures x1 - Continue oral Keppra. However if unable to tolerate by mouth may have to switch to IV.  Hypothyroidism - Continue thyroid supplementation.  Hypertension - Came in hypotensive which has improved. - Continue beta blockers.  History of CVA - Continue aspirin and Plavix.  Fibromyalgia/chronic pain - Continue home pain medication regimen.    Code Status: Full   Family Communication: Discussed with spouse at bedside.   Disposition Plan: Home when medically stable.    Time spent: 70 minutes   [facility] Pager [phone]  If 7PM-7AM, please contact night-coverage [email or web address] Password [facility] 12/28/2012, 8:33 AM

## 2012-12-28 NOTE — ED Notes (Signed)
SHOWN LAB RESULTS TO DR.MOLPUS CHEM8

## 2012-12-28 NOTE — ED Notes (Signed)
Dr wyatt at bedside 

## 2012-12-28 NOTE — ED Notes (Signed)
Paged IV team.  They confirm it is okay to use PICC.

## 2012-12-28 NOTE — Progress Notes (Signed)
I removed this patient's [place] drain from her RUQ yesterday in my office as it had not drained any significant amount for several days.  The procedure was uneventlful.  She had been having problems with nausea and vomiting prior to seeing me yesterday.  Today her examination is benign.  No peritonitis.  Her T-tube remains clamped and the plan was to get a T-tube cholangiogram on 10/13, and remove the T-tube on 10/14 if the cholangiogram was good.  I do not believe this current process is from removing the [place] drain.  She is dehydrated and anemic with Hgb of 3.0.  However, with her hyperviscosity PCV she typicaly runs hemoglobins between 4.5 and 5.5.  You need to discuss with Dr. [name], her hematologist and oncologist, about blood product administration and her diagnosis.  Marta Lamas. Gae Bon, MD, FACS [phone] [facility]

## 2012-12-28 NOTE — Care Management Note (Signed)
  Page 1 of 1   12/29/2012     10:13:28 AM   CARE MANAGEMENT NOTE 12/29/2012  Patient:  [name], [name]   Account Number:  [id number]  Date Initiated:  12/28/2012  Documentation initiated by:  Ronny Flurry  Subjective/Objective Assessment:     Action/Plan:   Anticipated DC Date:     Anticipated DC Plan:  HOME W HOME HEALTH SERVICES         Choice offered to / List presented to:          [facility] arranged  HH-1 RN      [facility] agency  [facility]   Status of service:   Medicare Important Message given?   (If response is "NO", the following Medicare IM given date fields will be blank) Date Medicare IM given:   Date Additional Medicare IM given:    Discharge Disposition:    Per UR Regulation:    If discussed at Long Length of Stay Meetings, dates discussed:    Comments:  12-28-12 Patient active with [facility] will need resumption of care orders at discharge. Ronny Flurry RN BSN [phone]

## 2012-12-28 NOTE — ED Notes (Signed)
Blood bank called blood is ready for transfusion.

## 2012-12-28 NOTE — Progress Notes (Signed)
Seen for social call.

## 2012-12-28 NOTE — Progress Notes (Signed)
Patient ID: [name], female   DOB: [birth year], 67 y.o.   MRN: [id number]    Subjective: Pt known to [place] and Dr. Lindie Hobbs very well as she recently underwent an open cholecystectomy with common duct exploration and placement of a JP drain and T-tube.  She has been doing fairly well at home, given her multitude of other medical problems.  She saw Dr. Lindie Hobbs yesterday and had her JP drain removed.  She started throwing up multiple times after that.  However, she does state that she developed pain around her CVA on the left side 2 days ago that has tracked around the front of her abdomen into her groin.  She is concerned for a UTI as she has had some dysuria recently as well.  We have been asked to evaluate the patient given her recent surgical history.  She has no new abdominal pain.  Objective: Vital signs in last 24 hours: Temp:  [98.3 F (36.8 C)-99 F (37.2 C)] 99 F (37.2 C) (10/02 0328) Pulse Rate:  [96-127] 99 (10/02 0700) Resp:  [16-31] 16 (10/02 0715) BP: (86-166)/(53-67) 121/60 mmHg (10/02 0715) SpO2:  [100 %] 100 % (10/02 0700) Weight:  [135 lb 12.8 oz (61.598 kg)] 135 lb 12.8 oz (61.598 kg) (10/01 1353)    Intake/Output from previous day:   Intake/Output this shift: Total I/O In: 1000 [I.V.:1000] Out: -   PE: General: NAD, but very pale Heart: regular LungS: CTAB Abd: soft, normal amount of tenderness that she usually has, +BS, t-tube clamped and gauze in place over JP site.  ND  Lab Results:   Recent Labs  12/28/12 0424 12/28/12 0437  WBC 7.8  --   HGB 4.1* 5.1*  HCT 16.1* 15.0*  PLT 520*  --    BMET  Recent Labs  12/28/12 0437  NA 138  K 4.4  CL 100  GLUCOSE 183*  BUN 26*  CREATININE 0.90   PT/INR No results found for this basename: LABPROT, INR,  in the last 72 hours CMP     Component Value Date/Time   NA 138 12/28/2012 0437   NA 136 12/20/2012 1334   K 4.4 12/28/2012 0437   K 4.1 12/20/2012 1334   CL 100 12/28/2012 0437   CL 99 12/20/2012 1334   CO2 28 12/20/2012 1334   CO2 29 12/07/2012 0500   GLUCOSE 183* 12/28/2012 0437   GLUCOSE 225* 12/20/2012 1334   BUN 26* 12/28/2012 0437   BUN 8 12/20/2012 1334   CREATININE 0.90 12/28/2012 0437   CREATININE 0.4* 12/20/2012 1334   CALCIUM 8.4 12/20/2012 1334   CALCIUM 7.9* 12/07/2012 0500   PROT 6.0* 12/20/2012 1334   PROT 4.2* 12/04/2012 0400   ALBUMIN 2.0* 12/04/2012 0400   AST 18 12/20/2012 1334   AST 7 12/04/2012 0400   ALT 12 12/20/2012 1334   ALT <5 12/04/2012 0400   ALKPHOS 80 12/20/2012 1334   ALKPHOS 58 12/04/2012 0400   BILITOT 0.60 12/20/2012 1334   BILITOT 0.3 12/04/2012 0400   GFRNONAA >90 12/07/2012 0500   GFRAA >90 12/07/2012 0500   Lipase     Component Value Date/Time   LIPASE 23 09/30/2012 0039       Studies/Results: Dg Chest Port 1 View  12/28/2012   CLINICAL DATA:  Nausea, weakness.  EXAM: PORTABLE CHEST - 1 VIEW  COMPARISON:  11/21/2012  FINDINGS: Left PICC line remains in place, unchanged. Heart and mediastinal contours are within normal limits. No focal opacities or effusions.  No acute bony abnormality.  IMPRESSION: No active disease.   Electronically Signed   By: Charlett Nose M.D.   On: 12/28/2012 04:11    Anti-infectives: Anti-infectives   None       Assessment/Plan  1. Nausea, vomiting 2. Dehydration Patient Active Problem List   Diagnosis Date Noted  . Occlusion and stenosis of carotid artery with cerebral infarction 12/21/2012  . Seizures 12/21/2012  . Postop check 12/19/2012  . Occlusion and stenosis of carotid artery without mention of cerebral infarction 10/26/2012  . Cholecystostomy tube dysfunction 10/24/2012  . 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 Acute cholecystitis 10/07/2012  . CVA (cerebral infarction) 10/05/2012  . Septic shock 10/02/2012  . Cholelithiases 09/30/2012  . Unspecified constipation 09/30/2012  . Bronchitis 03/08/2011  . Benign hypertensive heart disease without heart failure 09/28/2010  . Aortic valve sclerosis 09/28/2010  .  Hypothyroidism 09/28/2010  . Polycythemia vera(238.4) 09/28/2010  . Postmenopausal state 09/28/2010  . Fibromyalgia 09/28/2010   Plan: 1. Patient appears to be surgically stable.  Doubt her nausea and vomiting is secondary to drain removal of clamping of t-tube as this was a week ago and she has tolerated it since then.  We are awaiting LFTs. 2. Recommend checking a UA to rule out another source of nausea and vomiting, such as UTI.  3. Agree with clear liquids as able to tolerate. 4. Will alert Dr. Lindie Hobbs of patient's admission. 5. Will follow the patient with you.  LOS: 0 days    [name] E 12/28/2012, 7:59 AM Pager: [phone]

## 2012-12-29 ENCOUNTER — Inpatient Hospital Stay (HOSPITAL_COMMUNITY): Payer: Medicare Other

## 2012-12-29 ENCOUNTER — Other Ambulatory Visit (HOSPITAL_COMMUNITY): Payer: Medicare Other

## 2012-12-29 DIAGNOSIS — D45 Polycythemia vera: Secondary | ICD-10-CM

## 2012-12-29 DIAGNOSIS — D649 Anemia, unspecified: Secondary | ICD-10-CM

## 2012-12-29 DIAGNOSIS — E43 Unspecified severe protein-calorie malnutrition: Secondary | ICD-10-CM | POA: Insufficient documentation

## 2012-12-29 LAB — CBC
HCT: 12.3 % — ABNORMAL LOW (ref 36.0–46.0)
Hemoglobin: 3.4 g/dL — CL (ref 12.0–15.0)
MCH: 18.8 pg — ABNORMAL LOW (ref 26.0–34.0)
MCHC: 27.6 g/dL — ABNORMAL LOW (ref 30.0–36.0)
Platelets: 214 10*3/uL (ref 150–400)
RBC: 1.81 MIL/uL — ABNORMAL LOW (ref 3.87–5.11)
RBC: 2.29 MIL/uL — ABNORMAL LOW (ref 3.87–5.11)
RDW: 23.7 % — ABNORMAL HIGH (ref 11.5–15.5)
WBC: 5.6 10*3/uL (ref 4.0–10.5)
WBC: 6.6 10*3/uL (ref 4.0–10.5)

## 2012-12-29 LAB — COMPREHENSIVE METABOLIC PANEL
ALT: <5 U/L (ref 0–35)
BUN: 15 mg/dL (ref 6–23)
CO2: 23 mEq/L (ref 19–32)
Calcium: 7.6 mg/dL — ABNORMAL LOW (ref 8.4–10.5)
Creatinine, Ser: 0.57 mg/dL (ref 0.50–1.10)
GFR calc Af Amer: >90 mL/min (ref >90)
GFR calc non Af Amer: >90 mL/min (ref >90)
Glucose, Bld: 87 mg/dL (ref 70–99)
Sodium: 135 mEq/L (ref 135–145)
Total Protein: 4.2 g/dL — ABNORMAL LOW (ref 6.0–8.3)

## 2012-12-29 LAB — URINE CULTURE: Colony Count: 9000

## 2012-12-29 MED ORDER — SODIUM CHLORIDE 0.9 % IJ SOLN
10.0000 mL | INTRAMUSCULAR | Status: DC | PRN
  Administered 2012-12-29: 20 mL
  Administered 2012-12-31 – 2013-01-03 (×4): 10 mL

## 2012-12-29 MED ORDER — ADULT MULTIVITAMIN W/MINERALS CH
1.0000 | ORAL_TABLET | Freq: Every day | ORAL | Status: DC
  Administered 2012-12-30 – 2013-01-01 (×3): 1 via ORAL
  Filled 2012-12-29 (×5): qty 1

## 2012-12-29 MED ORDER — ENSURE COMPLETE PO LIQD
237.0000 mL | Freq: Two times a day (BID) | ORAL | Status: DC
  Administered 2012-12-29: 237 mL via ORAL

## 2012-12-29 MED ORDER — BISOPROLOL FUMARATE 10 MG PO TABS
10.0000 mg | ORAL_TABLET | Freq: Every day | ORAL | Status: DC
  Administered 2012-12-29 – 2013-01-03 (×6): 10 mg via ORAL
  Filled 2012-12-29 (×7): qty 1

## 2012-12-29 MED ORDER — BOOST / RESOURCE BREEZE PO LIQD
1.0000 | Freq: Three times a day (TID) | ORAL | Status: DC
  Administered 2012-12-30: 1 via ORAL

## 2012-12-29 MED ORDER — PRO-STAT SUGAR FREE PO LIQD
30.0000 mL | Freq: Two times a day (BID) | ORAL | Status: DC
  Administered 2012-12-30: 30 mL via ORAL
  Filled 2012-12-29 (×12): qty 30

## 2012-12-29 MED ORDER — POTASSIUM CHLORIDE CRYS ER 20 MEQ PO TBCR
40.0000 meq | EXTENDED_RELEASE_TABLET | Freq: Every day | ORAL | Status: DC
  Administered 2012-12-29: 40 meq via ORAL
  Filled 2012-12-29: qty 2

## 2012-12-29 MED ORDER — POTASSIUM CHLORIDE IN NACL 20-0.9 MEQ/L-% IV SOLN
INTRAVENOUS | Status: AC
  Administered 2012-12-29 (×2): via INTRAVENOUS
  Filled 2012-12-29 (×2): qty 1000

## 2012-12-29 NOTE — Progress Notes (Signed)
CRITICAL VALUE ALERT  Critical value received:  hgb 3.4  Date of notification:  12/29/12  Time of notification:  0553  Critical value read back:yes  Nurse who received alert:  Leonie Man RN  MD notified (1st page):  K.Schorr  Time of first page:  [phone]  MD notified (2nd page):  Time of second page:  Responding MD:  Merdis Delay  Time MD responded:  [phone]

## 2012-12-29 NOTE — Consult Note (Signed)
#  [id number] is consult note.  Pete E.  Romans 12:12

## 2012-12-29 NOTE — Progress Notes (Signed)
INITIAL NUTRITION ASSESSMENT  DOCUMENTATION CODES Per approved criteria  -Severe malnutrition in the context of acute illness or injury  Pt meets criteria for SEVERE MALNUTRITION in the context of ACUTE illness as evidenced by 15% wt loss in one month and estimated energy intake <50% of estimated energy needs for >5 days.   INTERVENTION: Provide Resource Breeze TID Add Boost Plus and Ensure Complete supplements once diet advanced Provide Pro-stat BID Provide Multivitamin with minerals daily  NUTRITION DIAGNOSIS: Inadequate oral intake related to nausea/vomiting as evidenced by 15% wt loss in one month.   Goal: Pt to meet >/= 90% of their estimated nutrition needs   Monitor:  PO intake/diet advancement Weight Labs  Reason for Assessment: Malnutrition Screening Tool, score of 5  67 y.o. female  Admitting Dx: Nausea and vomiting  ASSESSMENT: 67 y.o. female with extensive past medical and surgical history, recently underwent open cholecystectomy with common duct exploration and placement of a JP drain and T-tube. Past medical history includes but not limited to polycythemia vera, fibromyalgia and chronic pain syndrome, hyperlipidemia, CVA, GERD, hypertension, seizure, hypothyroidism and a left upper arm PICC line. She was seen by the surgeons on 12/27/2012. Her appetite has been quite poor since the surgery with intermittent nausea and vomiting.  Pt reports that for the past month she has had a fair appetite but, when she tries to eat she becomes nauseous and has been vomiting most foods and liquids.  Pt has lost 23 lbs (15% wt loss) in the past month. She reports feeling better today and has been able to keep clear liquids down. Pt reports trying to follow a bland diet PTA.   Height: Ht Readings from Last 1 Encounters:  12/28/12 5\' 3"  (1.6 m)    Weight: Wt Readings from Last 1 Encounters:  12/28/12 135 lb 12.8 oz (61.598 kg)    Ideal Body Weight: 115 lbs  % Ideal Body  Weight: 117%  Wt Readings from Last 10 Encounters:  12/28/12 135 lb 12.8 oz (61.598 kg)  12/27/12 135 lb 12.8 oz (61.598 kg)  12/21/12 143 lb (64.864 kg)  12/19/12 145 lb (65.772 kg)  11/29/12 158 lb 11.7 oz (72 kg)  11/29/12 158 lb 11.7 oz (72 kg)  11/29/12 158 lb 11.7 oz (72 kg)  11/24/12 159 lb (72.122 kg)  11/21/12 158 lb 2 oz (71.725 kg)  11/09/12 151 lb 12.8 oz (68.856 kg)    Usual Body Weight: 165 lbs  % Usual Body Weight: 82%  BMI:  Body mass index is 24.06 kg/(m^2).  Estimated Nutritional Needs: Kcal: 1600-1800 Protein: 80-100 grams Fluid: 2.1 L/day  Skin: non-pitting RLE and LLE edema; abdominal incision  Diet Order: Clear Liquid  EDUCATION NEEDS: -No education needs identified at this time   Intake/Output Summary (Last 24 hours) at 12/29/12 1226 Last data filed at 12/29/12 0948  Gross per 24 hour  Intake    600 ml  Output   1200 ml  Net   -600 ml    Last BM: 10/1  Labs:   Recent Labs Lab 12/28/12 0437 12/29/12 0440  NA 138 135  K 4.4 3.6  CL 100 107  CO2  --  23  BUN 26* 15  CREATININE 0.90 0.57  CALCIUM  --  7.6*  GLUCOSE 183* 87    CBG (last 3)  No results found for this basename: GLUCAP,  in the last 72 hours  Scheduled Meds: . ALPRAZolam  0.5 mg Oral QHS  . aspirin  325  mg Oral QPC breakfast  . bisoprolol  10 mg Oral QPC breakfast  . ciprofloxacin  400 mg Intravenous Q12H  . clopidogrel  75 mg Oral QPC breakfast  . cyclobenzaprine  10 mg Oral QHS  . docusate sodium  100 mg Oral TID WC  . estrogens (conjugated)  0.625 mg Oral QPC breakfast  . feeding supplement  237 mL Oral BID BM  . levETIRAcetam  500 mg Oral BID  . loratadine  10 mg Oral Daily  . mineral oil  60 mL Oral QHS  . OxyCODONE  20 mg Oral Q8H  . pantoprazole (PROTONIX) IV  40 mg Intravenous Q24H  . potassium chloride  40 mEq Oral QPC breakfast  . senna  1 tablet Oral TID WC  . senna-docusate  2 tablet Oral QHS  . sodium chloride  3 mL Intravenous Q12H  .  thyroid  150 mg Oral QPC breakfast  . zolpidem  5 mg Oral QHS    Continuous Infusions: . 0.9 % NaCl with KCl 20 mEq / L 75 mL/hr at 12/29/12 5284    Past Medical History  Diagnosis Date  . Fibromyalgia     chronic pain syndrome  . Postmenopausal state     on hormone replacement therapy  . Aortic valve sclerosis     by echocardiogram 12/04/2009  . Hyperlipidemia     "not since carotid OR" (12/28/2012)  . 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 GERD (gastroesophageal reflux disease)   . PAC (premature atrial contraction)   . Chronic constipation     takes Mineral Oil,Juice,Enema(prn),and Miralax(Prn) and Cascara nightly  . Insomnia     takes restoril nightly and Xanax  . Hypothyroidism   . Seasonal allergies     takes Allegra in am and Benadryl at night  . Occlusion and stenosis of carotid artery without mention of cerebral infarction 10/26/2012  . 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 Acute cholecystitis 10/07/2012    s/p pec drain due to recent CVA, pending chole 11/2012  . Difficult intubation     grade 3 airway  . PONV (postoperative nausea and vomiting)     "I vomit for 5 days straight w/certain RX w/thanes" (12/28/2012)  . Hypertension     "not since carotid OR" (12/28/2012)  . Heart murmur     "flow mumur" (12/28/2012)  . Exertional dyspnea   . Asthma     "wheeze occasionally; I don't have asthma" (12/28/2012)  . Anemia     "due to polycythemia vera" (12/28/2012)  . History of blood transfusion 09/2012; 12/28/2012  . Seizures     only seizure was 10/14/12 "after carotid endarterectomy";takes Keppra daily  . Stroke 09/2012    "after they put drain in my gallbladder"; residual is "haven't felt well enough to tell since stroke to tell; weak already; weaker when I'm tired" (12/28/2012)  . Chronic back pain   . Anxiety   . Depression   . Polycythemia vera(238.4)     hyperviscosity variant    Past Surgical History   Procedure Laterality Date  . Endarterectomy Left 10/10/2012    Procedure: ENDARTERECTOMY CAROTID- LEFT NECK WITH GREATER SAPHENOUS VEIN PATCH LEFT LEG;  Surgeon: Sherren Kerns, MD;  Location: [facility] OR;  Service: Vascular;  Laterality: Left;  . Carotid endarterectomy Left 10-10-12  . Abdominal hysterectomy  1996  . Tonsillectomy      age 43  . Nasal septum surgery  late 61's  . Colonoscopy    . Insertion of drain  10/02/12    right low abdomen and draining pus  . Picc line placed      10/15/12  . Cholecystectomy N/A 11/29/2012    Procedure: ATTEMPTED LAPAROSCOPIC CHOLECYSTECTOMY ;  Surgeon: Cherylynn Ridges, MD;  Location: [facility] OR;  Service: General;  Laterality: N/A;  . Cholecystectomy N/A 11/29/2012    Procedure: CHOLECYSTECTOMY, COMMON BILE DUCT EXPLORATION, T-TUBE PLACEMENT;  Surgeon: Cherylynn Ridges, MD;  Location: [facility] OR;  Service: General;  Laterality: N/A;  . Tubal ligation  ~ 1972    Ian Malkin RD, LDN Inpatient Clinical Dietitian Pager: [phone] After Hours Pager: [phone]

## 2012-12-29 NOTE — Consult Note (Signed)
[name], [name]                 ACCOUNT NO.:  [id number]  MEDICAL RECORD NO.:  [id number]  LOCATION:  6N08C                        FACILITY:  [facility]  PHYSICIAN:  [name], M.D.  DATE OF BIRTH:  [birth year]  DATE OF CONSULTATION:  12/29/2012 DATE OF DISCHARGE:                                CONSULTATION   REFERRING PHYSICIAN:  Marcellus Scott, MD.  REASON FOR CONSULTATION: 1. Severe anemia. 2. Polycythemia vera. 3. Recent cholecystectomy  HOSPITAL COURSE:  [name] is a very nice 67 year old white female.  I have known her for over 12 years.  She has polycythemia vera.  She has a hyperviscosity variant.  We may keep her hemoglobin around 5.  She has had a lot of issues over the past couple of months.  She has had a CVA.  She has had a carotid endarterectomy. She has undergone cholecystectomy.  She had a significant bout of cholecystitis back in August.  She had a cholecystotomy tube in until she have her cholecystectomy.  She had this back on September 3.  The pathology report showed acute and chronic cholecystitis with necrosis and gallstones.  She was seen by Dr. Lindie Hobbs 2 days ago.  He pulled 1 of the tubes.  She has been having some problems with dehydration.  She had nausea and vomiting.  She just got weaker.  She subsequently came into the emergency room.  She had a CBC done.  This showed a white cell count 7.8, hemoglobin 4.1, hematocrit 16.1, platelet count 520.  She, I think, got a repeat CBC just 10 minutes later.  This showed a hemoglobin of 5.1 and hematocrit 15.  That evening, after hydration, hemoglobin went down to 3.  She has had 2 units of blood so far.  This morning, her hemoglobin is 3.4 with hematocrit 24.3, white cell count 5.6.  Platelet count is 229. She is not complaining of any abdominal pain.  Again, she has this nausea and vomiting.  A SMAC was done today.  This showed a sodium 135, potassium 3.6, BUN 15, creatinine 0.57.  Calcium 7.6.   Albumin of 1.8.  She has not had any scans done to date.  She did have a chest x-ray done on the 2nd.  This was unremarkable.  There is a PICC line in the left arm.  Again she has had 2 units of blood.  PAST MEDICAL HISTORY:  Well documented in the medical record.  ALLERGIES: 1. CODEINE. 2. PENICILLIN. 3. SYNTHROID. 4. CALCIUM CHANNEL BLOCKER.  MEDICATIONS:  Her admission medications were 1. Combivent inhaler. 2. Xanax. 3. Aspirin. 4. Zebeta. 5. Plavix. 6. Flexeril. 7. Colace. 8. Premarin. 9. Pepcid. 10.Allegra. 11.Androxy. 12.Lasix. 13.Keppra. 14.OxyContin. 15.Oxycodone. 16.Potassium. 17.Armour thyroid. 18.Restoril. 19.Senokot.  SOCIAL HISTORY:  Negative for tobacco or alcohol use.  FAMILY HISTORY:  Noncontributory.  REVIEW OF SYSTEMS:  Without fever.  There is cough.  There is no shortness of breath.  She has had no seizure.  There is no leg swelling. There has been no rashes.  She has had no melena or bright red blood per rectum.  She has had chronic fibromyalgia pain.  She has had some left- sided  abdominal pain.  There is no headache.  There is no double vision or blurred vision.  PHYSICAL EXAMINATION:  GENERAL:  This is a pale white female, in no obvious distress. VITAL SIGNS:  Temperature of 98.5, pulse 75, respiratory rate 18, blood pressure 91/57. HEAD AND NECK:  Normocephalic, atraumatic skull.  No scleral icterus. There is no ocular or oral lesions.  Conjunctiva are pale. NECK:  Supple with no adenopathy. LUNGS:  Clear to percussion and auscultation bilaterally. CARDIAC:  Regular rate and rhythm with normal S1, S2.  There are no murmurs, rubs, or bruits. ABDOMEN:  Soft.  She has a well healed laparotomy scar in the right upper quadrant.  She has no abdominal distention.  No guarding or rebound tenderness.  There may be some slight tenderness over on the left side.  A drainage tube is still in the right abdomen.  There is no erythema about  this.  Bowel sounds are decent. EXTREMITIES:  No clubbing, cyanosis, or edema.  Strength is 4+/5 bilaterally in the arms and legs. SKIN:  No rashes, ecchymosis, or petechia.  IMPRESSION:  [name] is a 67 year old white female with a hyperviscosity variant of polycythemia vera.  She typically has a hemoglobin of about 5-6.  This clearly is abnormal for her.  I would be worried about the possibility for having some type of bleeding internally.  I do not see anything on her exam that would suggest that however.  I do not see any ecchymoses on the flanks.  I do think that if I do need to get a CT scan on her abdomen and pelvis just to make sure there is no bleeding.  She is on aspirin and Plavix, which she has to be on because of this CVA that she had and a carotid endarterectomy.  Given her blood pressure, I do not think she needs Lasix right now that would just dehydrate her.  She is also on a beta blocker.  This may need to be adjusted dose wise.  I will probably get her off the Lovenox for now.  I would put her on sequential compression devices.  Again, will get a CT of the abdomen and pelvis this morning.  We will see how this looks.  I think we need to keep her hemoglobin above 4.  This should be adequate for her.  One of the problems of [name] is that she is profoundly iron deficient.  We have to keep her iron deficient in order to maintain her hemoglobin of about 5.  We will go ahead and follow her along closely.  I do appreciate all help that she is getting from the hospitalist and Dr. Lindie Hobbs.     [name], M.D.     PRE/MEDQ  D:  12/29/2012  T:  12/29/2012  Job:  [id number]

## 2012-12-29 NOTE — Progress Notes (Signed)
[facility] PROGRESS NOTE  [name] ZOX:[id number] DOB: 11-05-1945 DOA: 12/28/2012 PCP: [name], MD  Assessment/Plan:  1-Intractable nausea and vomiting  -Could be secondary to UTI.  -Patient S/P JP drain  removed.  - Unclear etiology.  - CT abdomen pelvis pending.  - Continue IV fluids and antiemetics  - IV PPI   2-UTI: Continue with ciprofloxacin. Follow up urine culture.   3-Dehydration  - secondary to nausea, vomiting and poor oral intake  - Continue with  IV fluids.   4-Hypotension  - Secondary to dehydration  - Improved after IV fluids   5-Microcytic anemia/polycythemia Vera  - Patient usually gets phlebotomies  - Current hemoglobin low of 3 g/dL.  -Hb goal 4 to 5. Patient to received one unit PRBC. Will follow Dr [name] recommendation.  - No overt bleeding.   6-GERD  - IV PPI   7-History of seizures x1  - Continue oral Keppra.   8-Hypothyroidism  - Continue thyroid supplementation.   9-Hypertension  - Continue beta blockers with holders parameters.    History of CVA  - Continue aspirin and Plavix.   Fibromyalgia/chronic pain  - Continue home pain medication regimen.   Code Status: Full Code.  Family Communication: Care discussed with patient.  Disposition Plan: Remain inpatient.    Consultants:  Dr [name]  Dr Lindie Spruce.    Procedures:  none  Antibiotics:  Ciprofloxacin 10-02  HPI/Subjective: Feeling better this morning. Last time she vomit was yesterday morning. Tolerating clear diet. Does related dysuria at times. Does relates left flank pain.   Objective: Filed Vitals:   12/29/12 0542  BP: 91/57  Pulse: 75  Temp: 98.5 F (36.9 C)  Resp: 18    Intake/Output Summary (Last 24 hours) at 12/29/12 0750 Last data filed at 12/28/12 2138  Gross per 24 hour  Intake    350 ml  Output   1200 ml  Net   -850 ml   Filed Weights   12/28/12 1732  Weight: 61.598 kg (135 lb 12.8 oz)    Exam:   General:  Pale  appearing , no distress.   Cardiovascular: S 1, S 2 RRR  Respiratory: CTA  Abdomen: BS present, soft. Nt  Musculoskeletal: no edema  Data Reviewed: Basic Metabolic Panel:  Recent Labs Lab 12/28/12 0437 12/29/12 0440  NA 138 135  K 4.4 3.6  CL 100 107  CO2  --  23  GLUCOSE 183* 87  BUN 26* 15  CREATININE 0.90 0.57  CALCIUM  --  7.6*   Liver Function Tests:  Recent Labs Lab 12/28/12 0740 12/29/12 0440  AST 9 8  ALT <5 <5  ALKPHOS 65 50  BILITOT 0.2* 0.4  PROT 5.0* 4.2*  ALBUMIN 2.1* 1.8*    Recent Labs Lab 12/28/12 0740  LIPASE 18   No results found for this basename: AMMONIA,  in the last 168 hours CBC:  Recent Labs Lab 12/28/12 0424 12/28/12 0437 12/28/12 0740 12/28/12 2114 12/29/12 0440  WBC 7.8  --  3.6* 7.2 5.6  NEUTROABS 6.7  --  2.7  --   --   HGB 4.1* 5.1* 3.0* 3.8* 3.4*  HCT 16.1* 15.0* 11.8* 13.4* 12.3*  MCV 63.6*  --  63.8* 68.4* 68.0*  PLT 520*  --  242 263 229   Cardiac Enzymes: No results found for this basename: CKTOTAL, CKMB, CKMBINDEX, TROPONINI,  in the last 168 hours BNP (last 3 results)  Recent Labs  10/05/12 0455  PROBNP 2326.0*  CBG: No results found for this basename: GLUCAP,  in the last 168 hours  No results found for this or any previous visit (from the past 240 hour(s)).   Studies: Dg Chest Port 1 View  12/28/2012   CLINICAL DATA:  Nausea, weakness.  EXAM: PORTABLE CHEST - 1 VIEW  COMPARISON:  11/21/2012  FINDINGS: Left PICC line remains in place, unchanged. Heart and mediastinal contours are within normal limits. No focal opacities or effusions. No acute bony abnormality.  IMPRESSION: No active disease.   Electronically Signed   By: Charlett Nose M.D.   On: 12/28/2012 04:11    Scheduled Meds: . ALPRAZolam  0.5 mg Oral QHS  . aspirin  325 mg Oral QPC breakfast  . bisoprolol  10 mg Oral QPC breakfast  . ciprofloxacin  400 mg Intravenous Q12H  . clopidogrel  75 mg Oral QPC breakfast  . cyclobenzaprine  10 mg  Oral QHS  . docusate sodium  100 mg Oral TID WC  . estrogens (conjugated)  0.625 mg Oral QPC breakfast  . levETIRAcetam  500 mg Oral BID  . loratadine  10 mg Oral Daily  . mineral oil  60 mL Oral QHS  . OxyCODONE  20 mg Oral Q8H  . pantoprazole (PROTONIX) IV  40 mg Intravenous Q24H  . potassium chloride  40 mEq Oral QPC breakfast  . senna  1 tablet Oral TID WC  . senna-docusate  2 tablet Oral QHS  . sodium chloride  3 mL Intravenous Q12H  . thyroid  150 mg Oral QPC breakfast  . zolpidem  5 mg Oral QHS   Continuous Infusions: . 0.9 % NaCl with KCl 20 mEq / L 75 mL/hr at 12/28/12 1658    Principal Problem:   Nausea and vomiting Active Problems:   Benign hypertensive heart disease without heart failure   Hypothyroidism   Polycythemia vera(238.4)   Fibromyalgia   CVA (cerebral infarction)   Complex partial seizure disorder   Anemia   Dehydration   Hypotension   Status post cholecystectomy    Time spent: 35 minutes.     Henrik Orihuela  [facility] Pager [phone]. If 7PM-7AM, please contact night-coverage at [email or web address], password [facility] 12/29/2012, 7:50 AM  LOS: 1 day

## 2012-12-30 DIAGNOSIS — I119 Hypertensive heart disease without heart failure: Secondary | ICD-10-CM

## 2012-12-30 LAB — BASIC METABOLIC PANEL
CO2: 23 mEq/L (ref 19–32)
Calcium: 7.7 mg/dL — ABNORMAL LOW (ref 8.4–10.5)
Creatinine, Ser: 0.5 mg/dL (ref 0.50–1.10)
GFR calc non Af Amer: >90 mL/min (ref >90)
Glucose, Bld: 85 mg/dL (ref 70–99)
Potassium: 4 mEq/L (ref 3.5–5.1)
Sodium: 136 mEq/L (ref 135–145)

## 2012-12-30 LAB — CBC
Hemoglobin: 4.4 g/dL — CL (ref 12.0–15.0)
MCV: 71.2 fL — ABNORMAL LOW (ref 78.0–100.0)
Platelets: 214 10*3/uL (ref 150–400)
RBC: 2.12 MIL/uL — ABNORMAL LOW (ref 3.87–5.11)
WBC: 4.7 10*3/uL (ref 4.0–10.5)

## 2012-12-30 MED ORDER — POTASSIUM CHLORIDE CRYS ER 10 MEQ PO TBCR
40.0000 meq | EXTENDED_RELEASE_TABLET | Freq: Every day | ORAL | Status: DC
  Administered 2012-12-30 – 2012-12-31 (×2): 40 meq via ORAL
  Filled 2012-12-30 (×2): qty 4

## 2012-12-30 NOTE — Progress Notes (Signed)
[facility] PROGRESS NOTE  [name] YQM:[id number] DOB: 1945-08-20 DOA: 12/28/2012 PCP: [name], MD  Assessment/Plan:  1-Intractable nausea and vomiting  -Could be secondary to UTI.  -Patient S/P JP drain  removed.  - Unclear etiology.  - CT abdomen pelvis, reviewed by Dr Dwain Sarna, finding consistent with post operative changes.  - Continue IV fluids and antiemetics  - IV PPI  -Advance diet.   2-UTI: Continue with ciprofloxacin day 2. urine culture no significant growth but patient with symptoms consistent with UTI. Will continue with antibiotics. Marland Kitchen   3-Dehydration  - secondary to nausea, vomiting and poor oral intake  - resolved. Tolerating oral intake. Will NSL.   4-Hypotension  - Secondary to dehydration  -resolved with IV fluids.   5-Microcytic anemia/polycythemia Vera  - Patient usually gets phlebotomies  -Hb goal 4 to 5. Patient to received one unit PRBC. Will follow Dr [name] recommendation.  - No overt bleeding. CT head negative.  -Repeat Hb in am.   6-GERD  - IV PPI   7-History of seizures x1  - Continue oral Keppra.   8-Hypothyroidism  - Continue thyroid supplementation.   9-Hypertension  - Continue beta blockers with holders parameters.    History of CVA  - Continue aspirin and Plavix.   Fibromyalgia/chronic pain  - Continue home pain medication regimen.   Code Status: Full Code.  Family Communication: Care discussed with patient.  Disposition Plan: Remain inpatient.    Consultants:  Dr [name]  Dr Lindie Spruce.    Procedures:  none  Antibiotics:  Ciprofloxacin 10-02  HPI/Subjective: She is feeling tired. No worsening abdominal pain. She is willing to try soft diet.     Objective: Filed Vitals:   12/30/12 0526  BP: 114/39  Pulse: 73  Temp: 98.6 F (37 C)  Resp: 16    Intake/Output Summary (Last 24 hours) at 12/30/12 1248 Last data filed at 12/30/12 0100  Gross per 24 hour  Intake      3 ml  Output     800 ml  Net   -797 ml   Filed Weights   12/28/12 1732  Weight: 61.598 kg (135 lb 12.8 oz)    Exam:   General:  Pale appearing , no distress.   Cardiovascular: S 1, S 2 RRR  Respiratory: CTA  Abdomen: BS present, soft. Nt  Musculoskeletal: no edema  Data Reviewed: Basic Metabolic Panel:  Recent Labs Lab 12/28/12 0437 12/29/12 0440 12/30/12 0510  NA 138 135 136  K 4.4 3.6 4.0  CL 100 107 107  CO2  --  23 23  GLUCOSE 183* 87 85  BUN 26* 15 6  CREATININE 0.90 0.57 0.50  CALCIUM  --  7.6* 7.7*   Liver Function Tests:  Recent Labs Lab 12/28/12 0740 12/29/12 0440  AST 9 8  ALT <5 <5  ALKPHOS 65 50  BILITOT 0.2* 0.4  PROT 5.0* 4.2*  ALBUMIN 2.1* 1.8*    Recent Labs Lab 12/28/12 0740  LIPASE 18   No results found for this basename: AMMONIA,  in the last 168 hours CBC:  Recent Labs Lab 12/28/12 0424  12/28/12 0740 12/28/12 2114 12/29/12 0440 12/29/12 1600 12/30/12 0510  WBC 7.8  --  3.6* 7.2 5.6 6.6 4.7  NEUTROABS 6.7  --  2.7  --   --   --   --   HGB 4.1*  < > 3.0* 3.8* 3.4* 4.9* 4.4*  HCT 16.1*  < > 11.8* 13.4* 12.3* 16.4* 15.1*  MCV 63.6*  --  63.8* 68.4* 68.0* 71.6* 71.2*  PLT 520*  --  242 263 229 214 214  < > = values in this interval not displayed. Cardiac Enzymes: No results found for this basename: CKTOTAL, CKMB, CKMBINDEX, TROPONINI,  in the last 168 hours BNP (last 3 results)  Recent Labs  10/05/12 0455  PROBNP 2326.0*   CBG: No results found for this basename: GLUCAP,  in the last 168 hours  Recent Results (from the past 240 hour(s))  URINE CULTURE     Status: None   Collection Time    12/28/12 12:04 PM      Result Value Range Status   Specimen Description URINE, RANDOM   Final   Special Requests NONE   Final   Culture  Setup Time     Final   Value: 12/28/2012 13:02     Performed at [facility] Count     Final   Value: 9,000 COLONIES/ML     Performed at [facility]   Culture     Final    Value: INSIGNIFICANT GROWTH     Performed at [facility]   Report Status 12/29/2012 FINAL   Final     Studies: Ct Abdomen Pelvis Wo Contrast  12/29/2012   CLINICAL DATA:  Low hemoglobin. Recent cholecystectomy. Evaluate for hematoma  EXAM: CT ABDOMEN AND PELVIS WITHOUT CONTRAST  TECHNIQUE: Multidetector CT imaging of the abdomen and pelvis was performed following the standard protocol without intravenous contrast.  COMPARISON:  09/30/2012.  FINDINGS: No pleural effusion identified.  The lung bases appear clear.  There is no focal liver abnormality identified. The patient is status post cholecystectomy. There is a pigtail drainage catheter with pigtail in the gallbladder fossa. There is a small amount of fluid which surrounds the intra-abdominal portion of the catheter. There is also a small amount of fluid overlying the right hepatic lobe. No large hematoma identified. The pancreas is on unremarkable. The spleen is normal.  Normal appearance of both adrenal glands. The kidneys are both on unremarkable. Urinary bladder appears normal. Previous hysterectomy.  Normal caliber of the abdominal aorta. No upper abdominal adenopathy. There is no pelvic or inguinal adenopathy identified. No retroperitoneal hematoma identified. No pelvic hematoma or fluid collection noted.  The stomach appears normal. The small bowel loops are unremarkable. No evidence for bowel obstruction. The appendix is visualized and appears normal. Normal appearance of the colon.  Review of the visualized bony structures is significant for mild spondylosis within the lumbar spine. No aggressive lytic or sclerotic bone lesions identified.  IMPRESSION: 1. Status post cholecystectomy.  2. Pigtail drainage catheter is in place with tip in the gallbladder fossa. A small amount of fluid is identified surrounding the intra-abdominal portions of the catheter in around the right hepatic lobe.  3. No significant hematoma identified to account for  the patient's low hemoglobin.   Electronically Signed   By: Signa Kell M.D.   On: 12/29/2012 08:49    Scheduled Meds: . ALPRAZolam  0.5 mg Oral QHS  . aspirin  325 mg Oral QPC breakfast  . bisoprolol  10 mg Oral QPC breakfast  . ciprofloxacin  400 mg Intravenous Q12H  . clopidogrel  75 mg Oral QPC breakfast  . cyclobenzaprine  10 mg Oral QHS  . docusate sodium  100 mg Oral TID WC  . estrogens (conjugated)  0.625 mg Oral QPC breakfast  . feeding supplement  237 mL Oral BID  BM  . feeding supplement  30 mL Oral BID AC  . feeding supplement  1 Container Oral TID WC  . levETIRAcetam  500 mg Oral BID  . loratadine  10 mg Oral Daily  . mineral oil  60 mL Oral QHS  . multivitamin with minerals  1 tablet Oral Daily  . OxyCODONE  20 mg Oral Q8H  . pantoprazole (PROTONIX) IV  40 mg Intravenous Q24H  . potassium chloride  40 mEq Oral QPC breakfast  . senna  1 tablet Oral TID WC  . senna-docusate  2 tablet Oral QHS  . sodium chloride  3 mL Intravenous Q12H  . thyroid  150 mg Oral QPC breakfast  . 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     REGALADO,BELKYS  [facility] Pager [phone]. If 7PM-7AM, please contact night-coverage at [email or web address], password [facility] 12/30/2012, 12:48 PM  LOS: 2 days

## 2012-12-31 LAB — CBC
Hemoglobin: 4.9 g/dL — CL (ref 12.0–15.0)
MCH: 20.9 pg — ABNORMAL LOW (ref 26.0–34.0)
MCV: 71.5 fL — ABNORMAL LOW (ref 78.0–100.0)
RBC: 2.35 MIL/uL — ABNORMAL LOW (ref 3.87–5.11)
WBC: 4 10*3/uL (ref 4.0–10.5)

## 2012-12-31 NOTE — Progress Notes (Signed)
I do not believe that her current problems are related directly to her gallbladder surgery.  Her LFT's are normal.  She has no abdominal pain.  She could have a GI bleed, but will check stool hemoccult.  T-tube cholagiogram scheduled for 10/13.  Marta Lamas. Gae Bon, MD, FACS [phone] [facility]

## 2012-12-31 NOTE — Progress Notes (Signed)
[facility] PROGRESS NOTE  [name] ZOX:[id number] DOB: [birth year] DOA: 12/28/2012 PCP: [name], MD  Assessment/Plan:  1-Intractable nausea and vomiting  -Could be secondary to UTI. Patient relates black stool today. Will check for occult blood. If positive will need GI evaluation.  -Patient S/P JP drain  removed.  - Unclear etiology.  - CT abdomen pelvis, reviewed by Dr Dwain Sarna, finding consistent with post operative changes.  - IV PPI  -Tolerating diet.   2-UTI: Continue with ciprofloxacin day 3. urine culture no significant growth but patient with symptoms consistent with UTI. Will continue with antibiotics.  3-Dehydration  - secondary to nausea, vomiting and poor oral intake  - resolved. Tolerating oral intake. Will NSL.   4-Hypotension  - Secondary to dehydration  -resolved with IV fluids.   5-Microcytic anemia/polycythemia Vera  - Patient usually gets phlebotomies  -Hb goal 4 to 5. Patient to received one unit PRBC. Will follow Dr [name] recommendation.  - No overt bleeding. CT head negative.  -HB 4.9. Repeat in am. Check guaiac stool.   6-GERD  - IV PPI   7-History of seizures x1  - Continue oral Keppra.   8-Hypothyroidism  - Continue thyroid supplementation.   9-Hypertension  - Continue beta blockers with holders parameters.    History of CVA  - Continue aspirin and Plavix. If guaiac stool positive, she will need evaluation by GI to determine safety of using anticoagulation.   Fibromyalgia/chronic pain  - Continue home pain medication regimen.   Code Status: Full Code.  Family Communication: Care discussed with patient.  Disposition Plan: Remain inpatient.    Consultants:  Dr [name]  Dr Lindie Spruce.    Procedures:  none  Antibiotics:  Ciprofloxacin 10-02  HPI/Subjective: She is feeling better, this morning. She is feeling stronger. She is sitting in the bed eating breakfast. Tolerating diet. Abdominal Pain is not worse.  No more vomiting.   She relates fluids LE. No SOB.   Objective: Filed Vitals:   12/31/12 0607  BP: 107/82  Pulse: 80  Temp: 98.6 F (37 C)  Resp: 16    Intake/Output Summary (Last 24 hours) at 12/31/12 1030 Last data filed at 12/31/12 0640  Gross per 24 hour  Intake      0 ml  Output   1050 ml  Net  -1050 ml   Filed Weights   12/28/12 1732  Weight: 61.598 kg (135 lb 12.8 oz)    Exam:   General:  Pale appearing , no distress.   Cardiovascular: S 1, S 2 RRR  Respiratory: CTA  Abdomen: BS present, soft. Nt  Musculoskeletal: no edema  Data Reviewed: Basic Metabolic Panel:  Recent Labs Lab 12/28/12 0437 12/29/12 0440 12/30/12 0510  NA 138 135 136  K 4.4 3.6 4.0  CL 100 107 107  CO2  --  23 23  GLUCOSE 183* 87 85  BUN 26* 15 6  CREATININE 0.90 0.57 0.50  CALCIUM  --  7.6* 7.7*   Liver Function Tests:  Recent Labs Lab 12/28/12 0740 12/29/12 0440  AST 9 8  ALT <5 <5  ALKPHOS 65 50  BILITOT 0.2* 0.4  PROT 5.0* 4.2*  ALBUMIN 2.1* 1.8*    Recent Labs Lab 12/28/12 0740  LIPASE 18   No results found for this basename: AMMONIA,  in the last 168 hours CBC:  Recent Labs Lab 12/28/12 0424  12/28/12 0740 12/28/12 2114 12/29/12 0440 12/29/12 1600 12/30/12 0510 12/31/12 0500  WBC 7.8  --  3.6* 7.2 5.6 6.6 4.7 4.0  NEUTROABS 6.7  --  2.7  --   --   --   --   --   HGB 4.1*  < > 3.0* 3.8* 3.4* 4.9* 4.4* 4.9*  HCT 16.1*  < > 11.8* 13.4* 12.3* 16.4* 15.1* 16.8*  MCV 63.6*  --  63.8* 68.4* 68.0* 71.6* 71.2* 71.5*  PLT 520*  --  242 263 229 214 214 194  < > = values in this interval not displayed. Cardiac Enzymes: No results found for this basename: CKTOTAL, CKMB, CKMBINDEX, TROPONINI,  in the last 168 hours BNP (last 3 results)  Recent Labs  10/05/12 0455  PROBNP 2326.0*   CBG: No results found for this basename: GLUCAP,  in the last 168 hours  Recent Results (from the past 240 hour(s))  URINE CULTURE     Status: None   Collection Time     12/28/12 12:04 PM      Result Value Range Status   Specimen Description URINE, RANDOM   Final   Special Requests NONE   Final   Culture  Setup Time     Final   Value: 12/28/2012 13:02     Performed at [facility] Count     Final   Value: 9,000 COLONIES/ML     Performed at [facility]   Culture     Final   Value: INSIGNIFICANT GROWTH     Performed at [facility]   Report Status 12/29/2012 FINAL   Final     Studies: No results found.  Scheduled Meds: . ALPRAZolam  0.5 mg Oral QHS  . aspirin  325 mg Oral QPC breakfast  . bisoprolol  10 mg Oral QPC breakfast  . ciprofloxacin  400 mg Intravenous Q12H  . clopidogrel  75 mg Oral QPC breakfast  . cyclobenzaprine  10 mg Oral QHS  . docusate sodium  100 mg Oral TID WC  . estrogens (conjugated)  0.625 mg Oral QPC breakfast  . feeding supplement  237 mL Oral BID BM  . feeding supplement  30 mL Oral BID AC  . feeding supplement  1 Container Oral TID WC  . levETIRAcetam  500 mg Oral BID  . loratadine  10 mg Oral Daily  . mineral oil  60 mL Oral QHS  . multivitamin with minerals  1 tablet Oral Daily  . OxyCODONE  20 mg Oral Q8H  . pantoprazole (PROTONIX) IV  40 mg Intravenous Q24H  . potassium chloride  40 mEq Oral QPC breakfast  . senna  1 tablet Oral TID WC  . senna-docusate  2 tablet Oral QHS  . sodium chloride  3 mL Intravenous Q12H  . thyroid  150 mg Oral QPC breakfast  . 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     Parsa Rickett  [facility] Pager [phone]. If 7PM-7AM, please contact  night-coverage at [email or web address], password [facility] 12/31/2012, 10:30 AM  LOS: 3 days

## 2013-01-01 DIAGNOSIS — R1110 Vomiting, unspecified: Secondary | ICD-10-CM

## 2013-01-01 LAB — CBC
HCT: 17.1 % — ABNORMAL LOW (ref 36.0–46.0)
Hemoglobin: 4.9 g/dL — CL (ref 12.0–15.0)
MCH: 20.7 pg — ABNORMAL LOW (ref 26.0–34.0)
MCV: 72.2 fL — ABNORMAL LOW (ref 78.0–100.0)
RDW: 24.9 % — ABNORMAL HIGH (ref 11.5–15.5)
WBC: 3.4 10*3/uL — ABNORMAL LOW (ref 4.0–10.5)

## 2013-01-01 LAB — TYPE AND SCREEN
Antibody Screen: NEGATIVE
Unit division: 0
Unit division: 0
Unit division: 0

## 2013-01-01 LAB — BASIC METABOLIC PANEL
BUN: 3 mg/dL — ABNORMAL LOW (ref 6–23)
CO2: 27 mEq/L (ref 19–32)
Chloride: 105 mEq/L (ref 96–112)
Creatinine, Ser: 0.49 mg/dL — ABNORMAL LOW (ref 0.50–1.10)
GFR calc Af Amer: >90 mL/min (ref >90)
Glucose, Bld: 90 mg/dL (ref 70–99)
Potassium: 3.7 mEq/L (ref 3.5–5.1)

## 2013-01-01 MED ORDER — HYOSCYAMINE SULFATE ER 0.375 MG PO TB12
0.3750 mg | ORAL_TABLET | Freq: Two times a day (BID) | ORAL | Status: DC
  Administered 2013-01-01 – 2013-01-03 (×3): 0.375 mg via ORAL
  Filled 2013-01-01 (×5): qty 1

## 2013-01-01 MED ORDER — TEMAZEPAM 15 MG PO CAPS
30.0000 mg | ORAL_CAPSULE | Freq: Every day | ORAL | Status: DC
  Administered 2013-01-01 – 2013-01-02 (×2): 30 mg via ORAL
  Filled 2013-01-01 (×2): qty 2

## 2013-01-01 MED ORDER — PANTOPRAZOLE SODIUM 40 MG PO TBEC
40.0000 mg | DELAYED_RELEASE_TABLET | Freq: Every day | ORAL | Status: DC
  Administered 2013-01-01 – 2013-01-03 (×3): 40 mg via ORAL
  Filled 2013-01-01 (×3): qty 1

## 2013-01-01 NOTE — Progress Notes (Signed)
[name] had a rough day yesterday. He had some vomiting. She is all worried about this now. She does not feel as if she is ready to go home. There is no CBC back yet. There is no abdominal pain. Not out of bed. She really needs some physical therapy.  She continues on Cipro. So far cultures have been negative.  She's had no fever. Her blood pressure is okay.  On physical exam, temperature 98.5. Pulse is 81. Blood pressure 127/57. Her lungs are clear. Cardiac exam regular rate and rhythm with normal S1-S2. She is a 1/6 systolic ejection murmur. Abdomen is soft. Bowel sounds are present. There is some slight tenderness in the right upper quadrant. Left side is without tenderness. Extremities shows some trace edema in her legs.   Her CBC is not back yet. However, her hemoglobin has been pretty stable over the weekend.  She continues on aspirin and Plavix. There is no bleeding. She is on Keppra. There have been no seizures.  We will continue to follow along. I do not see need for any transfusions.    Pete E.

## 2013-01-01 NOTE — Consult Note (Signed)
Consultation  Referring Provider: [facility]      Primary Care Physician:  [name], MD Primary Gastroenterologist:  Dr. Sherin Quarry with [facility] (now retired) in the 1990's Reason for Consultation:  Nausea, vomiting, abdominal pain, and melena            HPI:   [name] is a 67 y.o. female with multiple medical problems including, but not limited to polycytemia vera, CAD, HTN, thyroid disease and chronic pain / fibromyalgia. She was hospitalized in July with gallstones / acute cholecystitis. Because of low hematocrit, possible UTI, recent plavix / ASA use, and  various other reasons surgery not done. Patient underwent percutaneous cholecystostomy placement instead. That same admission patient found to have acute left stroke. She had high grade left ICA stenosis and underwent left carotid endarterectomy  With left greater saphenous vein patch. Patient re-hospitalized in July with seizures.   Patient underwent cholecystectomy 11/29/12. Laparoscopic surgery attempted but adhesive disease / severe inflammation necessitated open procedure  Her distal CBD was obstructed with stones, CBD exploration with stone removal was carried out. A T-tube and Blake drain were left in place. was done. Patient has seen surgery at least twice since surgery. Her T-tube was clamped approximately two weeks ago, [name] drain removed a few days ago. She is scheduled for T-tube cholangiogram 10/13.  Patient came to ED 12/28/12 with nausea and vomiting. Emesis non-bloody. She is being treated for a UTI  Non-contrast CTscan demonstrates pigtail drainage catheter in place with tip in the gallbladder fossa. A small amount of fluid is identified surrounding the intra-abdominal portions of the catheter in around the right hepatic lobe. Surgery has seen and doesn't feel her current problems are related to gallbladder surgery. Patient's weight was 165 in May. It was 159 late August and down to 145 pounds 12/19/12 office  visit with Dr. Lindie Spruce. She has no appetite and was having significant nausea and vomiting leading up to this admission. Patient reports recent passage of black stools . Upon admission her hgb was down from baseline of 5-6 to 3.0.  She has recieved 2 units of blood, hgb 4.9 today. Patient takes an ASA a day. She is on daily Pepcid.     Patient reports seasonal epigastric pain which is chronic. Her current abdominal pain is describes as "surgical pain". She is mostly concerned with weight loss and diminished appetite. BMs are at baseline. Last colonoscopy was in the 1990's with [name]. Patient would now like to come under care of [name]. No FMH of any GI malignancies.   Past Medical History  Diagnosis Date  . Fibromyalgia     chronic pain syndrome  . Postmenopausal state     on hormone replacement therapy  . Aortic valve sclerosis     by echocardiogram 12/04/2009  . Hyperlipidemia     "not since carotid OR" (12/28/2012)  . 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 GERD (gastroesophageal reflux disease)   . PAC (premature atrial contraction)   . Chronic constipation     takes Mineral Oil,Juice,Enema(prn),and Miralax(Prn) and Cascara nightly  . Insomnia     takes restoril nightly and Xanax  . Hypothyroidism   . Seasonal allergies     takes Allegra in am and Benadryl at night  . Occlusion and stenosis of carotid artery without mention of cerebral infarction 10/26/2012  . 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 Acute cholecystitis 10/07/2012  s/p pec drain due to recent CVA, pending chole 11/2012  . Difficult intubation     grade 3 airway  . PONV (postoperative nausea and vomiting)     "I vomit for 5 days straight w/certain RX w/thanes" (12/28/2012)  . Hypertension     "not since carotid OR" (12/28/2012)  . Heart murmur     "flow mumur" (12/28/2012)  . Exertional dyspnea   . Asthma     "wheeze occasionally; I don't have asthma" (12/28/2012)    . Anemia     "due to polycythemia vera" (12/28/2012)  . History of blood transfusion 09/2012; 12/28/2012  . Seizures     only seizure was 10/14/12 "after carotid endarterectomy";takes Keppra daily  . Stroke 09/2012    "after they put drain in my gallbladder"; residual is "haven't felt well enough to tell since stroke to tell; weak already; weaker when I'm tired" (12/28/2012)  . Chronic back pain   . Anxiety   . Depression   . 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 Endarterectomy Left 10/10/2012    Procedure: ENDARTERECTOMY CAROTID- LEFT NECK WITH GREATER SAPHENOUS VEIN PATCH LEFT LEG;  Surgeon: Sherren Kerns, MD;  Location: [facility] OR;  Service: Vascular;  Laterality: Left;  . Carotid endarterectomy Left 10-10-12  . Abdominal hysterectomy  1996  . Tonsillectomy      age 12  . Nasal septum surgery      late 70's  . Colonoscopy    . Insertion of drain  10/02/12    right low abdomen and draining pus  . Picc line placed      10/15/12  . Cholecystectomy N/A 11/29/2012    Procedure: ATTEMPTED LAPAROSCOPIC CHOLECYSTECTOMY ;  Surgeon: Cherylynn Ridges, MD;  Location: [facility] OR;  Service: General;  Laterality: N/A;  . Cholecystectomy N/A 11/29/2012    Procedure: CHOLECYSTECTOMY, COMMON BILE DUCT EXPLORATION, T-TUBE PLACEMENT;  Surgeon: Cherylynn Ridges, MD;  Location: [facility] OR;  Service: General;  Laterality: N/A;  . Tubal ligation  ~ 26    Family History  Problem Relation Age of Onset  . Heart attack Father   . Coronary artery disease Mother     had aortic valve replacement     History  Substance Use Topics  . Smoking status: Never Smoker   . Smokeless tobacco: Never Used  . Alcohol Use: No    Prior to Admission medications   Medication Sig Start Date End Date Taking? Authorizing Provider  albuterol-ipratropium (COMBIVENT) 18-103 MCG/ACT inhaler Inhale 2 puffs into the lungs every 6 (six) hours as needed for wheezing.   Yes  Historical Provider, MD  ALPRAZolam [name]) 0.5 MG tablet Take 0.5 mg by mouth at bedtime. 06/27/12  Yes Josph Macho, MD  aspirin 325 MG tablet Take 325 mg by mouth daily after breakfast.    Yes Historical Provider, MD  bisoprolol (ZEBETA) 10 MG tablet Take 10 mg by mouth daily after breakfast.    Yes Historical Provider, MD  Cascara [place] 450 MG CAPS Take 900 mg by mouth at bedtime.   Yes Historical Provider, MD  cholecalciferol (VITAMIN D) 1000 UNITS tablet Take 3,000 Units by mouth daily after breakfast.    Yes Historical Provider, MD  clopidogrel (PLAVIX) 75 MG tablet Take 75 mg by mouth daily after breakfast.    Yes Historical Provider, MD  cyanocobalamin (,VITAMIN B-12,) 1000 MCG/ML injection Inject 1,000 mcg into the muscle as needed (  for energy).   Yes Historical Provider, MD  cyclobenzaprine (FLEXERIL) 10 MG tablet Take 10 mg by mouth See admin instructions. Patient make take 10 mg  twice a day as needed for spasms. Patient takes 10 mg at bedtime each day.   Yes Historical Provider, MD  diphenhydrAMINE (BENADRYL) 25 MG tablet Take 50 mg by mouth See admin instructions. Patient may take 50 mg three times a day as needed. Patient takes 50 mg at bedtime each day.   Yes Historical Provider, MD  docusate sodium (COLACE) 100 MG capsule Take 100 mg by mouth 3 (three) times daily with meals.    Yes Historical Provider, MD  estrogens, conjugated, (PREMARIN) 0.625 MG tablet Take 0.625 mg by mouth daily after breakfast.    Yes Historical Provider, MD  famotidine (PEPCID) 20 MG tablet Take 20 mg by mouth 2 (two) times daily before a meal.   Yes Historical Provider, MD  fexofenadine (ALLEGRA) 180 MG tablet Take 180 mg by mouth daily after breakfast.    Yes Historical Provider, MD  fluoxymesterone (ANDROXY) 10 MG tablet Take 2.5 mg by mouth daily as needed (before sexual activity).    Yes Historical Provider, MD  furosemide (LASIX) 40 MG tablet Take 80 mg by mouth daily after breakfast.  10/12/12  Yes  Ripudeep K Rai, MD  levETIRAcetam (KEPPRA) 500 MG tablet TAKE 1 TABLET TWICE A DAY 12/25/12  Yes Micki Riley, MD  mineral oil liquid Take 60 mLs by mouth at bedtime. With Juice   Yes Historical Provider, MD  nitroGLYCERIN (NITROSTAT) 0.4 MG SL tablet Place 0.4 mg under the tongue every 5 (five) minutes as needed for chest pain.   Yes Historical Provider, MD  oxyCODONE (OXYCONTIN) 10 MG 12 hr tablet Take 1 tablet (10 mg total) by mouth every 12 (twelve) hours as needed (for breakthrough pain). Brand name medically necessary. 12/20/12  Yes Josph Macho, MD  oxyCODONE (OXYCONTIN) 20 MG 12 hr tablet Take 1 tablet (20 mg total) by mouth every 8 (eight) hours. Brand name medically necessary. 12/20/12  Yes Josph Macho, MD  oxyCODONE (ROXICODONE) 15 MG immediate release tablet Take 1 tablet (15 mg total) by mouth every 8 (eight) hours as needed (for breakthrough pain). Brand name medically necessary. 12/20/12  Yes Josph Macho, MD  potassium chloride (MICRO-K) 10 MEQ CR capsule Take 80 mEq by mouth daily after breakfast.   Yes Historical Provider, MD  Sennosides (SENNA LAX PO) Take 1 tablet by mouth 3 (three) times daily with meals.    Yes Historical Provider, MD  temazepam (RESTORIL) 30 MG capsule Take 30 mg by mouth at bedtime.    Yes Historical Provider, MD  thyroid (ARMOUR) 60 MG tablet Take 150 mg by mouth daily after breakfast. Levothyroxine is not effective. Takes only armour thyroid.   Yes Historical Provider, MD    Current Facility-Administered Medications  Medication Dose Route Frequency Provider Last Rate Last Dose  . acetaminophen (TYLENOL) tablet 650 mg  650 mg Oral Q6H PRN Elease Etienne, MD       Or  . acetaminophen (TYLENOL) suppository 650 mg  650 mg Rectal Q6H PRN Elease Etienne, MD      . albuterol-ipratropium (COMBIVENT) inhaler 2 puff  2 puff Inhalation Q6H PRN Elease Etienne, MD      . ALPRAZolam [name]) tablet 0.5 mg  0.5 mg Oral QHS Elease Etienne, MD   0.5 mg at  12/31/12 2106  . aspirin tablet 325  mg  325 mg Oral QPC breakfast Elease Etienne, MD   325 mg at 01/01/13 1013  . bisoprolol (ZEBETA) tablet 10 mg  10 mg Oral QPC breakfast Josph Macho, MD   10 mg at 01/01/13 1012  . clopidogrel (PLAVIX) tablet 75 mg  75 mg Oral QPC breakfast Elease Etienne, MD   75 mg at 01/01/13 1013  . cyclobenzaprine (FLEXERIL) tablet 10 mg  10 mg Oral BID PRN Elease Etienne, MD   10 mg at 01/01/13 0825  . cyclobenzaprine (FLEXERIL) tablet 10 mg  10 mg Oral QHS Elease Etienne, MD   10 mg at 12/31/12 2106  . diphenhydrAMINE (BENADRYL) capsule 50 mg  50 mg Oral TID PRN Herby Abraham, RPH   50 mg at 01/01/13 1315  . docusate sodium (COLACE) capsule 100 mg  100 mg Oral TID WC Elease Etienne, MD   100 mg at 01/01/13 1243  . estrogens (conjugated) (PREMARIN) tablet 0.625 mg  0.625 mg Oral QPC breakfast Elease Etienne, MD   0.625 mg at 01/01/13 1013  . feeding supplement (ENSURE COMPLETE) liquid 237 mL  237 mL Oral BID BM Belkys A Regalado, MD   237 mL at 12/29/12 0922  . feeding supplement (PRO-STAT SUGAR FREE 64) liquid 30 mL  30 mL Oral BID AC Lorraine Lax, RD   30 mL at 12/30/12 0811  . feeding supplement (RESOURCE BREEZE) liquid 1 Container  1 Container Oral TID WC Lorraine Lax, RD   1 Container at 12/30/12 1214  . levETIRAcetam (KEPPRA) tablet 500 mg  500 mg Oral BID Elease Etienne, MD   500 mg at 01/01/13 1013  . loratadine (CLARITIN) tablet 10 mg  10 mg Oral Daily Elease Etienne, MD   10 mg at 01/01/13 1013  . mineral oil liquid 60 mL  60 mL Oral QHS Elease Etienne, MD   60 mL at 12/31/12 2108  . multivitamin with minerals tablet 1 tablet  1 tablet Oral Daily Lorraine Lax, RD   1 tablet at 01/01/13 1013  . nitroGLYCERIN (NITROSTAT) SL tablet 0.4 mg  0.4 mg Sublingual Q5 min PRN Elease Etienne, MD      . ondansetron [facility]) tablet 4 mg  4 mg Oral Q6H PRN Elease Etienne, MD       Or  . ondansetron (ZOFRAN) injection 4 mg  4 mg  Intravenous Q6H PRN Elease Etienne, MD   4 mg at 12/31/12 1325  . oxyCODONE (Oxy IR/ROXICODONE) immediate release tablet 15 mg  15 mg Oral Q8H PRN Elease Etienne, MD   15 mg at 12/31/12 1734  . OxyCODONE (OXYCONTIN) 12 hr tablet 20 mg  20 mg Oral Q8H Herby Abraham, RPH   20 mg at 01/01/13 1243  . pantoprazole (PROTONIX) EC tablet 40 mg  40 mg Oral Daily Drake Leach Rumbarger, RPH   40 mg at 01/01/13 1012  . senna (SENOKOT) tablet 8.6 mg  1 tablet Oral TID WC Elease Etienne, MD   8.6 mg at 01/01/13 1243  . senna-docusate (Senokot-S) tablet 2 tablet  2 tablet Oral QHS Herby Abraham, RPH   2 tablet at 12/31/12 2105  . sodium chloride 0.9 % injection 10-40 mL  10-40 mL Intracatheter PRN Elease Etienne, MD      . sodium chloride 0.9 % injection 10-40 mL  10-40 mL Intracatheter PRN Belkys A Regalado, MD   10 mL at  01/01/13 1610  . sodium chloride 0.9 % injection 3 mL  3 mL Intravenous Q12H Elease Etienne, MD   3 mL at 12/31/12 0905  . temazepam (RESTORIL) capsule 30 mg  30 mg Oral QHS Belkys A Regalado, MD      . thyroid (ARMOUR) tablet 150 mg  150 mg Oral QPC breakfast Elease Etienne, MD   150 mg at 01/01/13 1013    Allergies as of 12/28/2012 - Review Complete 12/28/2012  Allergen Reaction Noted  . Codeine Nausea Only 09/21/2010  . Synthroid [levothyroxine sodium] Other (See Comments) 09/28/2010  . Calcium channel blockers Palpitations 09/21/2010  . Penicillins Rash 09/21/2010   Review of Systems:    All systems reviewed and negative except where noted in HPI.    Physical Exam:  Vital signs in last 24 hours: Temp:  [98.2 F (36.8 C)-98.5 F (36.9 C)] 98.5 F (36.9 C) (10/06 0621) Pulse Rate:  [63-103] 81 (10/06 0621) Resp:  [16] 16 (10/06 0621) BP: (112-131)/(55-73) 127/57 mmHg (10/06 0621) SpO2:  [96 %-100 %] 96 % (10/06 0621) Last BM Date: 12/30/12 General:   Pleasant white female in NAD Head:  Normocephalic and atraumatic. Eyes:   No icterus.   Conjunctiva  pink. Ears:  Normal auditory acuity. Neck:  Supple; no masses felt Lungs:  Respirations even and unlabored. Lungs clear to auscultation bilaterally.   No wheezes, crackles, or rhonchi.  Heart:  Regular rate and rhythm Abdomen:  Soft, nondistended, nontender. Normal bowel sounds. No appreciable masses or hepatomegaly. Mild RUQ tenderness Rectal:  Not performed.  Msk:  Symmetrical without gross deformities.  Extremities:  Without edema. Neurologic:  Alert and  oriented x4;  grossly normal neurologically. Skin: Pallor, Intact without significant lesions or rashes. Cervical Nodes:  No significant cervical adenopathy. Psych:  Alert and cooperative. Normal affect.  LAB RESULTS:  Recent Labs  12/30/12 0510 12/31/12 0500 01/01/13 0619  WBC 4.7 4.0 3.4*  HGB 4.4* 4.9* 4.9*  HCT 15.1* 16.8* 17.1*  PLT 214 194 191   BMET  Recent Labs  12/30/12 0510 01/01/13 0619  NA 136 137  K 4.0 3.7  CL 107 105  CO2 23 27  GLUCOSE 85 90  BUN 6 3*  CREATININE 0.50 0.49*  CALCIUM 7.7* 7.6*    PREVIOUS ENDOSCOPIES:            remote colonoscopy ([name]).   Impression / Plan:   59. 67 year old female with multiple medical problems as described above.   2. Nausea, vomiting, weight loss. She had a complicated cholecystectomy in July but surgery doesn't feel symptoms are related. Etiology unclear. Some diagnostics considerations include PUD /gastritis (especially given report of black stool), partial gastric outlet obstruction, gastroparesis, intestinal neoplasm. For further evaluation patient will be scheduled for EGD to be done tomorrow. The benefits, risks, and potential complications of EGD with possible biopsieswere discussed with the patient and she agrees to proceed.   3. Polycythemia Vera, followed closely by Dr. [name]. Hgb down to 3.0 from her baseline of 4-5. She gets phlebotomies. Patient has been transfused this admission. Hgb back to baseline (4.9)  4. Chronic Plavix. Diagnostic EGD  to be done on Plavix  5. History of CVA in July  6. Recent open cholecystectomy with CBD exploration / stone removal. T-Tube in place. For T-tube cholangiogram on 10/13.  LFTs normal.     Thanks   LOS: 4 days   Willette Cluster  01/01/2013, 2:03 PM  Patient was seen and  examined, x-rays were reviewed.  GI problems include intractable nausea and vomiting, and anemia.  Patient did note black stools over the past 2 days suggesting that she has had a subacute GI bleed.  She is certainly at increased risk for stress gastritis or ulceration.  Bleeding from a lower GI source must also be considered.  Regarding nausea and vomiting, patient has a lifetime history of a queasy stomach with very low threshold for nausea and vomiting.  She's been taking narcotics for years rendering narcotics as a source for nausea unlikely.  She may have a hypersensitivity to pain stimulus causing nausea.  Recommendations #1 upper endoscopy #2 colonoscopy if patient is Hemoccult positive and upper endoscopy is not diagnostic #3 trial of long-acting anticholinergics for nausea - hyomax #4 continue protonix

## 2013-01-01 NOTE — Evaluation (Addendum)
Occupational Therapy Evaluation Patient Details Name: [name] MRN: [id number] DOB: 10/21/1945 Today's Date: 01/01/2013 Time: 4098-1191 OT Time Calculation (min): 31 min  OT Assessment / Plan / Recommendation History of present illness  67 y.o. female with extensive past medical and surgical history, recently underwent open cholecystectomy with common duct exploration and placement of a JP drain and T-tube. Past medical history includes but not limited to polycythemia vera (hematologist apparently aims to keep hemoglobin between 5-6 g/dL, a recent PRBC transfusion x1, phlebotomies, of fibromyalgia and chronic pain syndrome, hyperlipidemia, CVA, GERD, hypertension, seizure, hypothyroidism and a left upper arm PICC line. She was seen by the surgeons on 12/27/2012. Her appetite has been quite poor since the surgery with intermittent nausea and vomiting. She has lost 8-10 pounds over the last 4 weeks.    Clinical Impression   Pt presents with below problem list. Pt's husband assisting pt with tasks since July, but was independent before that. Pt limited by fatigue and has decreased activity tolerance. Pt will benefit from acute OT to increase independence prior to d/c home with husband.     OT Assessment  Patient needs continued OT Services    Follow Up Recommendations  Home health OT;Supervision/Assistance - 24 hour    Barriers to Discharge      Equipment Recommendations  None recommended by OT    Recommendations for Other Services    Frequency  Min 2X/week    Precautions / Restrictions Precautions Precautions: Fall Restrictions Weight Bearing Restrictions: No   Pertinent Vitals/Pain Pain in abdomen, not rated. Repositioned.     ADL  Eating/Feeding: Independent Where Assessed - Eating/Feeding: Chair Grooming: Set up;Supervision/safety Where Assessed - Grooming: Supported sitting Upper Body Bathing: Minimal assistance (due to fatigue) Where Assessed - Upper Body Bathing:  Supported sitting Lower Body Bathing: Minimal assistance (due to fatigue) Where Assessed - Lower Body Bathing: Unsupported sit to stand Upper Body Dressing: Supervision/safety;Set up Where Assessed - Upper Body Dressing: Supported sitting Lower Body Dressing: Min guard Where Assessed - Lower Body Dressing: Unsupported sit to stand Toilet Transfer: Min Pension scheme manager Method: Sit to Barista: Bedside commode;Regular height toilet Toileting - Clothing Manipulation and Hygiene: Min guard Where Assessed - Engineer, mining and Hygiene: Other (comment) (sit to stand from bed) Tub/Shower Transfer Method: Not assessed Equipment Used: Gait belt Transfers/Ambulation Related to ADLs: Min guard for ambulation-holding onto IV pole. Min guard for transfers. ADL Comments: Pt with decreased activity tolerance.  Practiced toilet transfer on [facility] and also practiced simulated regular height toilet transfer. Educated to stand in front of chair/bed when pulling up LB clothing for safety and also to sit to bathe for safety.     OT Diagnosis: Acute pain;Generalized weakness  OT Problem List: Decreased strength;Decreased activity tolerance;Impaired balance (sitting and/or standing);Decreased knowledge of use of DME or AE;Decreased knowledge of precautions;Pain;Increased edema OT Treatment Interventions: Self-care/ADL training;DME and/or AE instruction;Therapeutic activities;Patient/family education;Balance training;Therapeutic exercise   OT Goals(Current goals can be found in the care plan section) Acute Rehab OT Goals Patient Stated Goal: to be motivated to bathe, want to be able to brush teeth and use bathroom.  OT Goal Formulation: With patient Time For Goal Achievement: 01/08/13 Potential to Achieve Goals: Good ADL Goals Pt Will Perform Grooming: with set-up;with supervision;standing Pt Will Perform Upper Body Bathing: with set-up;sitting Pt Will Perform Lower  Body Bathing: with set-up;with supervision;sit to/from stand Pt Will Perform Lower Body Dressing: with set-up;with supervision;sit to/from stand Pt Will Transfer to  Toilet: with modified independence;ambulating;regular height toilet Pt Will Perform Toileting - Clothing Manipulation and hygiene: with modified independence;sit to/from stand Pt Will Perform Tub/Shower Transfer: Shower transfer;with supervision;ambulating;shower seat;rolling walker Additional ADL Goal #1: Pt will independently verbalize and demonstrate 3/3 energy conservation techniques during ADLs. Additional ADL Goal #2: Pt will be independent with HEP for both UE's to increase strength.   Visit Information  Last OT Received On: 01/01/13 Assistance Needed: +1 History of Present Illness:  67 y.o. female with extensive past medical and surgical history, recently underwent open cholecystectomy with common duct exploration and placement of a JP drain and T-tube. Past medical history includes but not limited to polycythemia vera (hematologist apparently aims to keep hemoglobin between 5-6 g/dL, a recent PRBC transfusion x1, phlebotomies, of fibromyalgia and chronic pain syndrome, hyperlipidemia, CVA, GERD, hypertension, seizure, hypothyroidism and a left upper arm PICC line. She was seen by the surgeons on 12/27/2012. Her appetite has been quite poor since the surgery with intermittent nausea and vomiting. She has lost 8-10 pounds over the last 4 weeks.        Prior Functioning     Home Living Family/patient expects to be discharged to:: Private residence Living Arrangements: Spouse/significant other Available Help at Discharge: Family;Available 24 hours/day Type of Home: House Home Access: Level entry Home Layout: One level Home Equipment: Walker - 4 wheels;Bedside commode;Hand held shower head;Shower seat;Grab bars - tub/shower;Adaptive equipment Adaptive Equipment: Reacher;Sock aid;Long-handled shoe horn;Long-handled  sponge Prior Function Level of Independence: Needs assistance Gait / Transfers Assistance Needed: assist getting in and out of shower ADL's / Homemaking Assistance Needed: dressing and bathing(setup); husband did cooking and cleaning-all this is since July 2014 Communication Communication: No difficulties         Vision/Perception     Copywriter, advertising Arousal/Alertness: Awake/alert Behavior During Therapy: WFL for tasks assessed/performed Overall Cognitive Status: Within Functional Limits for tasks assessed    Extremity/Trunk Assessment Upper Extremity Assessment Upper Extremity Assessment: RUE deficits/detail RUE Deficits / Details: slightly weaker than LUE; history of CVA Lower Extremity Assessment Lower Extremity Assessment: Defer to PT evaluation (swelling in feet-educated on ankle pumps)     Mobility Bed Mobility Bed Mobility: Rolling Left;Left Sidelying to Sit;Sit to Supine Rolling Left: 5: Supervision Left Sidelying to Sit: 5: Supervision Sit to Supine: 5: Supervision;HOB flat Transfers Transfers: Sit to Stand;Stand to Sit Sit to Stand: 4: Min guard;With upper extremity assist;From bed;From chair/3-in-1;From toilet Stand to Sit: 4: Min guard;With upper extremity assist;To bed;To chair/3-in-1;To toilet Details for Transfer Assistance: Cues for technique     Exercise     Balance     End of Session OT - End of Session Equipment Utilized During Treatment: Gait belt Activity Tolerance: Patient limited by fatigue Patient left: in bed;with call bell/phone within reach;with family/visitor present Nurse Communication: Other (comment) (back in bed)  GO     Earlie Raveling OTR/L [phone] 01/01/2013, 4:19 PM

## 2013-01-01 NOTE — Progress Notes (Signed)
[facility] PROGRESS NOTE  ZAMORIA BOSS EAV:[id number] DOB: 1946/01/23 DOA: 12/28/2012 PCP: [name], MD  Assessment/Plan:  1-Intractable nausea and vomiting  - Patient relates black stool. Will check for occult blood.  -Patient S/P JP drain  removed.  - Unclear etiology.  - CT abdomen pelvis, reviewed by Dr Dwain Sarna, finding consistent with post operative changes.  - IV PPI  -Patient vomit again yesterday, lunch, didn't eat supper. I have ask GI [facility] for consultation.   2-UTI: Continue with ciprofloxacin day 3. urine culture no significant growth but patient with symptoms consistent with UTI. Will discontinue antibiotics received 3 days.   3-Dehydration  - secondary to nausea, vomiting and poor oral intake  - resolved. Tolerating oral intake. Will NSL.   4-Hypotension  - Secondary to dehydration  -resolved with IV fluids.  -Continue to hold BP medication.   5-Microcytic anemia/polycythemia Vera  - Patient usually gets phlebotomies  -Hb goal 4 to 5. Patient to received one unit PRBC. Will follow Dr [name] recommendation.  - No overt bleeding. CT head negative.  -HB 4.9. Repeat in am. guaiac stool pending.   6-GERD  - IV PPI   7-History of seizures x1  - Continue oral Keppra.   8-Hypothyroidism  - Continue thyroid supplementation.   9-Hypertension  - Continue beta blockers with holders parameters.    History of CVA  - Continue aspirin and Plavix. If guaiac stool positive, she will need evaluation by GI to determine safety of using anticoagulation.   Fibromyalgia/chronic pain  - Continue home pain medication regimen.   Code Status: Full Code.  Family Communication: Care discussed with patient.  Disposition Plan: Remain inpatient.    Consultants:  Dr [name]  Dr Lindie Spruce.    Procedures:  none  Antibiotics:  Ciprofloxacin 10-02  HPI/Subjective: Has not had Bowel movement. She vomit again yesterday, lunch, jello and soup.  She takes  at home flexeril, xanax and Restoril at bedtime without any problem.   Objective: Filed Vitals:   01/01/13 0621  BP: 127/57  Pulse: 81  Temp: 98.5 F (36.9 C)  Resp: 16    Intake/Output Summary (Last 24 hours) at 01/01/13 1338 Last data filed at 01/01/13 0630  Gross per 24 hour  Intake    510 ml  Output   1200 ml  Net   -690 ml   Filed Weights   12/28/12 1732  Weight: 61.598 kg (135 lb 12.8 oz)    Exam:   General:  Pale appearing , no distress.   Cardiovascular: S 1, S 2 RRR  Respiratory: CTA  Abdomen: BS present, soft. Nt  Musculoskeletal: no edema  Data Reviewed: Basic Metabolic Panel:  Recent Labs Lab 12/28/12 0437 12/29/12 0440 12/30/12 0510 01/01/13 0619  NA 138 135 136 137  K 4.4 3.6 4.0 3.7  CL 100 107 107 105  CO2  --  23 23 27   GLUCOSE 183* 87 85 90  BUN 26* 15 6 3*  CREATININE 0.90 0.57 0.50 0.49*  CALCIUM  --  7.6* 7.7* 7.6*   Liver Function Tests:  Recent Labs Lab 12/28/12 0740 12/29/12 0440  AST 9 8  ALT <5 <5  ALKPHOS 65 50  BILITOT 0.2* 0.4  PROT 5.0* 4.2*  ALBUMIN 2.1* 1.8*    Recent Labs Lab 12/28/12 0740  LIPASE 18   No results found for this basename: AMMONIA,  in the last 168 hours CBC:  Recent Labs Lab 12/28/12 0424  12/28/12 0740  12/29/12 0440 12/29/12 1600  12/30/12 0510 12/31/12 0500 01/01/13 0619  WBC 7.8  --  3.6*  < > 5.6 6.6 4.7 4.0 3.4*  NEUTROABS 6.7  --  2.7  --   --   --   --   --   --   HGB 4.1*  < > 3.0*  < > 3.4* 4.9* 4.4* 4.9* 4.9*  HCT 16.1*  < > 11.8*  < > 12.3* 16.4* 15.1* 16.8* 17.1*  MCV 63.6*  --  63.8*  < > 68.0* 71.6* 71.2* 71.5* 72.2*  PLT 520*  --  242  < > 229 214 214 194 191  < > = values in this interval not displayed. Cardiac Enzymes: No results found for this basename: CKTOTAL, CKMB, CKMBINDEX, TROPONINI,  in the last 168 hours BNP (last 3 results)  Recent Labs  10/05/12 0455  PROBNP 2326.0*   CBG: No results found for this basename: GLUCAP,  in the last 168  hours  Recent Results (from the past 240 hour(s))  URINE CULTURE     Status: None   Collection Time    12/28/12 12:04 PM      Result Value Range Status   Specimen Description URINE, RANDOM   Final   Special Requests NONE   Final   Culture  Setup Time     Final   Value: 12/28/2012 13:02     Performed at [facility] Count     Final   Value: 9,000 COLONIES/ML     Performed at [facility]   Culture     Final   Value: INSIGNIFICANT GROWTH     Performed at [facility]   Report Status 12/29/2012 FINAL   Final     Studies: No results found.  Scheduled Meds: . ALPRAZolam  0.5 mg Oral QHS  . aspirin  325 mg Oral QPC breakfast  . bisoprolol  10 mg Oral QPC breakfast  . ciprofloxacin  400 mg Intravenous Q12H  . clopidogrel  75 mg Oral QPC breakfast  . cyclobenzaprine  10 mg Oral QHS  . docusate sodium  100 mg Oral TID WC  . estrogens (conjugated)  0.625 mg Oral QPC breakfast  . feeding supplement  237 mL Oral BID BM  . feeding supplement  30 mL Oral BID AC  . feeding supplement  1 Container Oral TID WC  . levETIRAcetam  500 mg Oral BID  . loratadine  10 mg Oral Daily  . mineral oil  60 mL Oral QHS  . multivitamin with minerals  1 tablet Oral Daily  . OxyCODONE  20 mg Oral Q8H  . pantoprazole  40 mg Oral Daily  . senna  1 tablet Oral TID WC  . senna-docusate  2 tablet Oral QHS  . sodium chloride  3 mL Intravenous Q12H  . temazepam  30 mg Oral QHS  . 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     Mersedes Alber  [facility] Pager  [phone]. If 7PM-7AM, please contact night-coverage at [email or web address], password [facility] 01/01/2013, 1:38 PM  LOS: 4 days

## 2013-01-02 ENCOUNTER — Encounter (HOSPITAL_COMMUNITY)
Admission: EM | Disposition: A | Discharge status: Home / Self Care | Payer: Self-pay | Source: Home / Self Care | Attending: Internal Medicine

## 2013-01-02 ENCOUNTER — Encounter (HOSPITAL_COMMUNITY): Payer: Self-pay | Admitting: Gastroenterology

## 2013-01-02 ENCOUNTER — Telehealth: Payer: Self-pay | Admitting: Hematology & Oncology

## 2013-01-02 DIAGNOSIS — I6350 Cerebral infarction due to unspecified occlusion or stenosis of unspecified cerebral artery: Secondary | ICD-10-CM

## 2013-01-02 DIAGNOSIS — M7989 Other specified soft tissue disorders: Secondary | ICD-10-CM

## 2013-01-02 HISTORY — PX: ESOPHAGOGASTRODUODENOSCOPY: SHX5428

## 2013-01-02 LAB — CBC
Hemoglobin: 4.8 g/dL — CL (ref 12.0–15.0)
MCHC: 28.6 g/dL — ABNORMAL LOW (ref 30.0–36.0)
RBC: 2.34 MIL/uL — ABNORMAL LOW (ref 3.87–5.11)
WBC: 2.9 10*3/uL — ABNORMAL LOW (ref 4.0–10.5)

## 2013-01-02 LAB — OCCULT BLOOD X 1 CARD TO LAB, STOOL: Fecal Occult Bld: POSITIVE — AB

## 2013-01-02 SURGERY — EGD (ESOPHAGOGASTRODUODENOSCOPY)
Anesthesia: Moderate Sedation

## 2013-01-02 MED ORDER — FENTANYL CITRATE 0.05 MG/ML IJ SOLN
INTRAMUSCULAR | Status: AC
  Filled 2013-01-02: qty 2

## 2013-01-02 MED ORDER — FUROSEMIDE 40 MG PO TABS
40.0000 mg | ORAL_TABLET | Freq: Every day | ORAL | Status: DC
  Administered 2013-01-02 – 2013-01-03 (×2): 40 mg via ORAL
  Filled 2013-01-02 (×2): qty 1

## 2013-01-02 MED ORDER — MIDAZOLAM HCL 5 MG/ML IJ SOLN
INTRAMUSCULAR | Status: AC
  Filled 2013-01-02: qty 2

## 2013-01-02 MED ORDER — POTASSIUM CHLORIDE CRYS ER 10 MEQ PO TBCR
20.0000 meq | EXTENDED_RELEASE_TABLET | Freq: Every day | ORAL | Status: DC
  Administered 2013-01-02 – 2013-01-03 (×2): 20 meq via ORAL
  Filled 2013-01-02 (×2): qty 2

## 2013-01-02 MED ORDER — GLYCOPYRROLATE 0.2 MG/ML IJ SOLN
INTRAMUSCULAR | Status: AC
  Filled 2013-01-02: qty 1

## 2013-01-02 MED ORDER — MIDAZOLAM HCL 5 MG/5ML IJ SOLN
INTRAMUSCULAR | Status: DC | PRN
  Administered 2013-01-02 (×3): 2 mg via INTRAVENOUS
  Administered 2013-01-02: 1 mg via INTRAVENOUS

## 2013-01-02 MED ORDER — FENTANYL CITRATE 0.05 MG/ML IJ SOLN
INTRAMUSCULAR | Status: DC | PRN
  Administered 2013-01-02 (×3): 25 ug via INTRAVENOUS

## 2013-01-02 MED ORDER — BUTAMBEN-TETRACAINE-BENZOCAINE 2-2-14 % EX AERO
INHALATION_SPRAY | CUTANEOUS | Status: DC | PRN
  Administered 2013-01-02: 2 via TOPICAL

## 2013-01-02 MED ORDER — POTASSIUM CHLORIDE CRYS ER 20 MEQ PO TBCR
20.0000 meq | EXTENDED_RELEASE_TABLET | Freq: Every day | ORAL | Status: DC
  Filled 2013-01-02: qty 1

## 2013-01-02 NOTE — Progress Notes (Signed)
PT/OT Cancellation Note  Patient Details Name: OTILA STARN MRN: [id number] DOB: Apr 01, 1945   Cancelled Treatment:    Reason Eval/Treat Not Completed: Patient declined, no reason specified.  Pt states she has a procedure today and will not be participating with therapy until tomorrow.  Will f/u tomorrow.     Sunny Schlein, [place] [phone] 01/02/2013, 10:49 AM

## 2013-01-02 NOTE — Telephone Encounter (Signed)
Patient's husband called and cx 01/03/13 apt due to patient being in the hospital

## 2013-01-02 NOTE — Progress Notes (Signed)
Occupational Therapy Treatment Patient Details Name: [name] MRN: [id number] DOB: 1946/01/06 Today's Date: 01/02/2013 Time: 4098-1191 OT Time Calculation (min): 31 min  OT Assessment / Plan / Recommendation  History of present illness  67 y.o. female with extensive past medical and surgical history, recently underwent open cholecystectomy with common duct exploration and placement of a JP drain and T-tube. Past medical history includes but not limited to polycythemia vera (hematologist apparently aims to keep hemoglobin between 5-6 g/dL, a recent PRBC transfusion x1, phlebotomies, of fibromyalgia and chronic pain syndrome, hyperlipidemia, CVA, GERD, hypertension, seizure, hypothyroidism and a left upper arm PICC line. She was seen by the surgeons on 12/27/2012. Her appetite has been quite poor since the surgery with intermittent nausea and vomiting. She has lost 8-10 pounds over the last 4 weeks.    OT comments  Educated on energy conservation techniques. Pt performed grooming and also toileting tasks. Gave pt handout on energy conservation and reviewed. Husband present for session.   Follow Up Recommendations  Home health OT;Supervision/Assistance - 24 hour    Barriers to Discharge       Equipment Recommendations  None recommended by OT    Recommendations for Other Services    Frequency Min 2X/week   Progress towards OT Goals Progress towards OT goals: Progressing toward goals  Plan Discharge plan remains appropriate    Precautions / Restrictions Precautions Precautions: Fall Restrictions Weight Bearing Restrictions: No   Pertinent Vitals/Pain Tightness in abdomen. Increased activity. Pt appeared comfortable in bed at end of session.     ADL  Grooming: Performed;Wash/dry face;Teeth care;Supervision/safety;Set up Where Assessed - Grooming: Supported standing;Supported sitting Toilet Transfer: Radiographer, therapeutic Method: Sit to Barista:  Comfort height toilet Toileting - Architect and Hygiene: Supervision/safety Where Assessed - Engineer, mining and Hygiene: Standing;Sit on 3-in-1 or toilet Transfers/Ambulation Related to ADLs: Min guard for ambulation and supervision for transfers. ADL Comments: Pt ambulated to bathroom and refused to use rolling walker. Pt performed grooming (teeth care and washed face/head with washcloth). She brushed teeth standing at supervision level and washed face sitting in chair. OT educated on energy conservation and gave pt handout.     OT Diagnosis:    OT Problem List:   OT Treatment Interventions:     OT Goals(current goals can now be found in the care plan section) Acute Rehab OT Goals Patient Stated Goal: to be motivated to bathe, want to be able to brush teeth and use bathroom.  OT Goal Formulation: With patient Time For Goal Achievement: 01/08/13 Potential to Achieve Goals: Good ADL Goals Pt Will Perform Grooming: with set-up;with supervision;standing Pt Will Perform Upper Body Bathing: with set-up;sitting Pt Will Perform Lower Body Bathing: with set-up;with supervision;sit to/from stand Pt Will Perform Lower Body Dressing: with set-up;with supervision;sit to/from stand Pt Will Transfer to Toilet: with modified independence;ambulating;regular height toilet Pt Will Perform Toileting - Clothing Manipulation and hygiene: with modified independence;sit to/from stand Pt Will Perform Tub/Shower Transfer: Shower transfer;with supervision;ambulating;shower seat;rolling walker Additional ADL Goal #1: Pt will independently verbalize and demonstrate 3/3 energy conservation techniques during ADLs. Additional ADL Goal #2: Pt will be independent with HEP for both UE's to increase strength.   Visit Information  Last OT Received On: 01/02/13 Assistance Needed: +1 History of Present Illness:  67 y.o. female with extensive past medical and surgical history, recently underwent  open cholecystectomy with common duct exploration and placement of a JP drain and T-tube. Past medical history includes but  not limited to polycythemia vera (hematologist apparently aims to keep hemoglobin between 5-6 g/dL, a recent PRBC transfusion x1, phlebotomies, of fibromyalgia and chronic pain syndrome, hyperlipidemia, CVA, GERD, hypertension, seizure, hypothyroidism and a left upper arm PICC line. She was seen by the surgeons on 12/27/2012. Her appetite has been quite poor since the surgery with intermittent nausea and vomiting. She has lost 8-10 pounds over the last 4 weeks.     Subjective Data      Prior Functioning       Cognition  Cognition Arousal/Alertness: Awake/alert Behavior During Therapy: WFL for tasks assessed/performed Overall Cognitive Status: Within Functional Limits for tasks assessed    Mobility  Bed Mobility Bed Mobility: Rolling Left;Left Sidelying to Sit;Sit to Supine Rolling Left: 6: Modified independent (Device/Increase time) Left Sidelying to Sit: 5: Supervision Sit to Supine: 5: Supervision;HOB flat Transfers Transfers: Sit to Stand;Stand to Sit Sit to Stand: From bed;From chair/3-in-1;From toilet;5: Supervision Stand to Sit: To bed;To chair/3-in-1;To toilet;5: Supervision Details for Transfer Assistance: Supervision for safety.     Exercises      Balance     End of Session OT - End of Session Activity Tolerance: Patient limited by fatigue;Other (comment) (became nauseous) Patient left: in bed;with call bell/phone within reach;with family/visitor present  GO     Earlie Raveling OTR/L [phone] 01/02/2013, 11:31 AM

## 2013-01-02 NOTE — Interval H&P Note (Signed)
History and Physical Interval Note:  01/02/2013 3:26 PM  [name]  has presented today for surgery, with the diagnosis of nausea, vomiting, balck stool  The various methods of treatment have been discussed with the patient and family. After consideration of risks, benefits and other options for treatment, the patient has consented to  Procedure(s): ESOPHAGOGASTRODUODENOSCOPY (EGD) (N/A) as a surgical intervention .  The patient's history has been reviewed, patient examined, no change in status, stable for surgery.  I have reviewed the patient's chart and labs.  Questions were answered to the patient's satisfaction.     The recent H&P (dated *01/01/13**) was reviewed, the patient was examined and there is no change in the patients condition since that H&P was completed.   [name]  01/02/2013, 3:26 PM   [name]

## 2013-01-02 NOTE — Telephone Encounter (Signed)
Tried to call again to reschedule appointment. No answer or voice mail

## 2013-01-02 NOTE — Progress Notes (Signed)
[facility] PROGRESS NOTE  [name] UJW:[id number] DOB: 06-10-1945 DOA: 12/28/2012 PCP: [name], MD  Assessment/Plan: [name] is a 67 y.o female with PMH significant polycytemia vera, HB goal 5 to 6, CAD, HTN, thyroid disease and chronic pain / fibromyalgia, history of stroke. She had high grade left ICA stenosis and underwent left carotid endarterectomy With left greater saphenous vein patch. Patient re-hospitalized in July with seizures. She underwent Cholecystectomy 9-03.  She had T tube and Blake tube drain in place. Her T-tube was clamped approximately two weeks ago, [name] drain removed a few days ago. She is scheduled for T-tube cholangiogram 10/13. She presents to the hospital with recurrence of nausea, vomiting and abdominal pain ( left side). Dr Lindie Spruce evaluated the patient and review CT abdomen perform during this admission. He doesn't think patient symptoms are related to gallbladder surgery. Patients report melena. She has required one unit of PRBC. GI was consulted. Plan is for endoscopy today. Dr [name] has been helping with patients care. Please Follow Dr [name] recommendation for blood transfusion. Patient received treatment for UTI with ciprofloxacin for 3 days. She was complaining of dysuria.   1-Intractable nausea and vomiting, abdominal pain - Patient relates black stool. occult blood pending.  - T tube clamp, blake drain removed 2 weeks ago.  - Unclear etiology.  - CT abdomen pelvis, reviewed by Dr Dwain Sarna, finding consistent with post operative changes.  - IV PPI  -For endoscopy today.   2-UTI: received ciprofloxacin for 3 days. urine culture no significant growth but patient with symptoms consistent with UTI.     3-Dehydration  - secondary to nausea, vomiting and poor oral intake  - resolved. Tolerating oral intake. NSL.   4-Hypotension  - Secondary to dehydration  -resolved with IV fluids.    5-Microcytic anemia/polycythemia Vera  -  Patient usually gets phlebotomies  -Hb goal 4 to 5. received one unit PRBC. follow Dr [name] recommendation.  - No overt bleeding. CT head negative.  - HB 4.8 today.  6-GERD  - IV PPI   7-History of seizures x1  - Continue oral Keppra.   8-Hypothyroidism  - Continue thyroid supplementation.   9-Hypertension  - Continue beta blockers with holders parameters.   -Lasix resume for LE edema.   History of CVA  - Continue aspirin and Plavix.  Fibromyalgia/chronic pain  - Continue home pain medication regimen.   Code Status: Full Code.  Family Communication: Care discussed with patient and husband who was at bedside.   Disposition Plan: Remain inpatient.    Consultants:  Dr [name]  Dr Lindie Spruce.    Procedures:  none  Antibiotics:  Ciprofloxacin 10-02-- 10-06  HPI/Subjective: Has not had Bowel movement. Waiting for endoscopy. No significant pain.   Objective: Filed Vitals:   01/02/13 0600  BP: 120/60  Pulse: 80  Temp: 98.1 F (36.7 C)  Resp: 16    Intake/Output Summary (Last 24 hours) at 01/02/13 1257 Last data filed at 01/01/13 1454  Gross per 24 hour  Intake    240 ml  Output    500 ml  Net   -260 ml   Filed Weights   12/28/12 1732  Weight: 61.598 kg (135 lb 12.8 oz)    Exam:   General:  Pale appearing , no distress.   Cardiovascular: S 1, S 2 RRR  Respiratory: CTA  Abdomen: BS present, soft. Nt, T tube in place.   Musculoskeletal: Trace edema  Data Reviewed: Basic Metabolic Panel:  Recent  Labs Lab 12/28/12 0437 12/29/12 0440 12/30/12 0510 01/01/13 0619  NA 138 135 136 137  K 4.4 3.6 4.0 3.7  CL 100 107 107 105  CO2  --  23 23 27   GLUCOSE 183* 87 85 90  BUN 26* 15 6 3*  CREATININE 0.90 0.57 0.50 0.49*  CALCIUM  --  7.6* 7.7* 7.6*   Liver Function Tests:  Recent Labs Lab 12/28/12 0740 12/29/12 0440  AST 9 8  ALT <5 <5  ALKPHOS 65 50  BILITOT 0.2* 0.4  PROT 5.0* 4.2*  ALBUMIN 2.1* 1.8*    Recent Labs Lab  12/28/12 0740  LIPASE 18   No results found for this basename: AMMONIA,  in the last 168 hours CBC:  Recent Labs Lab 12/28/12 0424  12/28/12 0740  12/29/12 1600 12/30/12 0510 12/31/12 0500 01/01/13 0619 01/02/13 0453  WBC 7.8  --  3.6*  < > 6.6 4.7 4.0 3.4* 2.9*  NEUTROABS 6.7  --  2.7  --   --   --   --   --   --   HGB 4.1*  < > 3.0*  < > 4.9* 4.4* 4.9* 4.9* 4.8*  HCT 16.1*  < > 11.8*  < > 16.4* 15.1* 16.8* 17.1* 16.8*  MCV 63.6*  --  63.8*  < > 71.6* 71.2* 71.5* 72.2* 71.8*  PLT 520*  --  242  < > 214 214 194 191 189  < > = values in this interval not displayed. Cardiac Enzymes: No results found for this basename: CKTOTAL, CKMB, CKMBINDEX, TROPONINI,  in the last 168 hours BNP (last 3 results)  Recent Labs  10/05/12 0455  PROBNP 2326.0*   CBG: No results found for this basename: GLUCAP,  in the last 168 hours  Recent Results (from the past 240 hour(s))  URINE CULTURE     Status: None   Collection Time    12/28/12 12:04 PM      Result Value Range Status   Specimen Description URINE, RANDOM   Final   Special Requests NONE   Final   Culture  Setup Time     Final   Value: 12/28/2012 13:02     Performed at [facility] Count     Final   Value: 9,000 COLONIES/ML     Performed at [facility]   Culture     Final   Value: INSIGNIFICANT GROWTH     Performed at [facility]   Report Status 12/29/2012 FINAL   Final     Studies: No results found.  Scheduled Meds: . ALPRAZolam  0.5 mg Oral QHS  . aspirin  325 mg Oral QPC breakfast  . bisoprolol  10 mg Oral QPC breakfast  . clopidogrel  75 mg Oral QPC breakfast  . cyclobenzaprine  10 mg Oral QHS  . docusate sodium  100 mg Oral TID WC  . estrogens (conjugated)  0.625 mg Oral QPC breakfast  . feeding supplement (ENSURE COMPLETE)  237 mL Oral BID BM  . feeding supplement (PRO-STAT SUGAR FREE 64)  30 mL Oral BID AC  . feeding supplement (RESOURCE BREEZE)  1 Container Oral TID WC  .  furosemide  40 mg Oral Daily  . hyoscyamine  0.375 mg Oral Q12H  . levETIRAcetam  500 mg Oral BID  . loratadine  10 mg Oral Daily  . mineral oil  60 mL Oral QHS  . multivitamin with minerals  1 tablet Oral Daily  .  OxyCODONE  20 mg Oral Q8H  . pantoprazole  40 mg Oral Daily  . potassium chloride  20 mEq Oral Daily  . senna  1 tablet Oral TID WC  . senna-docusate  2 tablet Oral QHS  . sodium chloride  3 mL Intravenous Q12H  . temazepam  30 mg Oral QHS  . 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     Bebe Moncure  [facility] Pager [phone]. If 7PM-7AM, please contact night-coverage at [email or web address], password [facility] 01/02/2013, 12:57 PM  LOS: 5 days

## 2013-01-02 NOTE — Op Note (Addendum)
[facility] [address] [place], [address]   ENDOSCOPY PROCEDURE REPORT  PATIENT: [name], [name]  MR#: [id number] BIRTHDATE: [birth year] , 67  yrs. old GENDER: Female ENDOSCOPIST: Louis Meckel, MD REFERRED BY:  Arlan Organ, M.D. PROCEDURE DATE:  01/02/2013 PROCEDURE:  EGD, diagnostic ASA CLASS:     Class II INDICATIONS:  Nausea.   Vomiting.   Heme positive stool. MEDICATIONS: These medications were titrated to patient response per physician's verbal order, Versed 7 mg IV, and Fentanyl 75 mcg IV TOPICAL ANESTHETIC: Cetacaine Spray  DESCRIPTION OF PROCEDURE: After the risks benefits and alternatives of the procedure were thoroughly explained, informed consent was obtained.  The Pentax Gastroscope [id number] endoscope was introduced through the mouth and advanced to the third portion of the duodenum. Without limitations.  The instrument was slowly withdrawn as the mucosa was fully examined.      The upper, middle and distal third of the esophagus were carefully inspected .  At the GE junction there were grade a erosions.  The z-line was well seen at the GEJ.  The endoscope was pushed into the fundus which was normal including a retroflexed view.  The antrum, gastric body, first and second part of the duodenum were unremarkable.  Retroflexed views revealed no abnormalities.     The scope was then withdrawn from the patient and the procedure completed.  COMPLICATIONS: There were no complications. ENDOSCOPIC IMPRESSION: grade a erosive esophagitis  RECOMMENDATIONS: 1.  continue hyomax and PPI therapy 2.  Hemoccults; if positive recommend colonoscopy as  outpatient  REPEAT EXAM:  eSigned:  Louis Meckel, MD 01/02/2013 4:31 PM Revised: 01/02/2013 4:31 PM  CC:

## 2013-01-02 NOTE — H&P (View-Only) (Signed)
   Consultation  Referring Provider: [facility]      Primary Care Physician:  Thomas Brackbill, MD Primary Gastroenterologist:  Dr. Weissman with [name] (now retired) in the 1990's Reason for Consultation:  Nausea, vomiting, abdominal pain, and melena            HPI:   [name] is a 67 y.o. female with multiple medical problems including, but not limited to polycytemia vera, CAD, HTN, thyroid disease and chronic pain / fibromyalgia. She was hospitalized in July with gallstones / acute cholecystitis. Because of low hematocrit, possible UTI, recent plavix / ASA use, and  various other reasons surgery not done. Patient underwent percutaneous cholecystostomy placement instead. That same admission patient found to have acute left stroke. She had high grade left ICA stenosis and underwent left carotid endarterectomy  With left greater saphenous vein patch. Patient re-hospitalized in July with seizures.   Patient underwent cholecystectomy 11/29/12. Laparoscopic surgery attempted but adhesive disease / severe inflammation necessitated open procedure  Her distal CBD was obstructed with stones, CBD exploration with stone removal was carried out. A T-tube and Blake drain were left in place. was done. Patient has seen surgery at least twice since surgery. Her T-tube was clamped approximately two weeks ago, Blake drain removed a few days ago. She is scheduled for T-tube cholangiogram 10/13.  Patient came to ED 12/28/12 with nausea and vomiting. Emesis non-bloody. She is being treated for a UTI  Non-contrast CTscan demonstrates pigtail drainage catheter in place with tip in the gallbladder fossa. A small amount of fluid is identified surrounding the intra-abdominal portions of the catheter in around the right hepatic lobe. Surgery has seen and doesn't feel her current problems are related to gallbladder surgery. Patient's weight was 165 in May. It was 159 late August and down to 145 pounds 12/19/12 office  visit with Dr. Wyatt. She has no appetite and was having significant nausea and vomiting leading up to this admission. Patient reports recent passage of black stools . Upon admission her hgb was down from baseline of 5-6 to 3.0.  She has recieved 2 units of blood, hgb 4.9 today. Patient takes an ASA a day. She is on daily Pepcid.     Patient reports seasonal epigastric pain which is chronic. Her current abdominal pain is describes as "surgical pain". She is mostly concerned with weight loss and diminished appetite. BMs are at baseline. Last colonoscopy was in the 1990's with [name]. Patient would now like to come under care of [facility]. No FMH of any GI malignancies.   Past Medical History  Diagnosis Date  . Fibromyalgia     chronic pain syndrome  . Postmenopausal state     on hormone replacement therapy  . Aortic valve sclerosis     by echocardiogram 12/04/2009  . Hyperlipidemia     "not since carotid OR" (12/28/2012)  . 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 GERD (gastroesophageal reflux disease)   . PAC (premature atrial contraction)   . Chronic constipation     takes Mineral Oil,Juice,Enema(prn),and Miralax(Prn) and Cascara nightly  . Insomnia     takes restoril nightly and Xanax  . Hypothyroidism   . Seasonal allergies     takes Allegra in am and Benadryl at night  . Occlusion and stenosis of carotid artery without mention of cerebral infarction 10/26/2012  . 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 Acute cholecystitis 10/07/2012      s/p pec drain due to recent CVA, pending chole 11/2012  . Difficult intubation     grade 3 airway  . PONV (postoperative nausea and vomiting)     "I vomit for 5 days straight w/certain RX w/thanes" (12/28/2012)  . Hypertension     "not since carotid OR" (12/28/2012)  . Heart murmur     "flow mumur" (12/28/2012)  . Exertional dyspnea   . Asthma     "wheeze occasionally; I don't have asthma" (12/28/2012)    . Anemia     "due to polycythemia vera" (12/28/2012)  . History of blood transfusion 09/2012; 12/28/2012  . Seizures     only seizure was 10/14/12 "after carotid endarterectomy";takes Keppra daily  . Stroke 09/2012    "after they put drain in my gallbladder"; residual is "haven't felt well enough to tell since stroke to tell; weak already; weaker when I'm tired" (12/28/2012)  . Chronic back pain   . Anxiety   . Depression   . 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 Endarterectomy Left 10/10/2012    Procedure: ENDARTERECTOMY CAROTID- LEFT NECK WITH GREATER SAPHENOUS VEIN PATCH LEFT LEG;  Surgeon: Charles E Fields, MD;  Location: [facility] OR;  Service: Vascular;  Laterality: Left;  . Carotid endarterectomy Left 10-10-12  . Abdominal hysterectomy  1996  . Tonsillectomy      age 14  . Nasal septum surgery      late 70's  . Colonoscopy    . Insertion of drain  10/02/12    right low abdomen and draining pus  . Picc line placed      10/15/12  . Cholecystectomy N/A 11/29/2012    Procedure: ATTEMPTED LAPAROSCOPIC CHOLECYSTECTOMY ;  Surgeon: James O Wyatt, MD;  Location: [facility] OR;  Service: General;  Laterality: N/A;  . Cholecystectomy N/A 11/29/2012    Procedure: CHOLECYSTECTOMY, COMMON BILE DUCT EXPLORATION, T-TUBE PLACEMENT;  Surgeon: James O Wyatt, MD;  Location: [facility] OR;  Service: General;  Laterality: N/A;  . Tubal ligation  ~ 1972    Family History  Problem Relation Age of Onset  . Heart attack Father   . Coronary artery disease Mother     had aortic valve replacement     History  Substance Use Topics  . Smoking status: Never Smoker   . Smokeless tobacco: Never Used  . Alcohol Use: No    Prior to Admission medications   Medication Sig Start Date End Date Taking? Authorizing Provider  albuterol-ipratropium (COMBIVENT) 18-103 MCG/ACT inhaler Inhale 2 puffs into the lungs every 6 (six) hours as needed for wheezing.   Yes  Historical Provider, MD  ALPRAZolam (XANAX) 0.5 MG tablet Take 0.5 mg by mouth at bedtime. 06/27/12  Yes Peter R Ennever, MD  aspirin 325 MG tablet Take 325 mg by mouth daily after breakfast.    Yes Historical Provider, MD  bisoprolol (ZEBETA) 10 MG tablet Take 10 mg by mouth daily after breakfast.    Yes Historical Provider, MD  Cascara [place] 450 MG CAPS Take 900 mg by mouth at bedtime.   Yes Historical Provider, MD  cholecalciferol (VITAMIN D) 1000 UNITS tablet Take 3,000 Units by mouth daily after breakfast.    Yes Historical Provider, MD  clopidogrel (PLAVIX) 75 MG tablet Take 75 mg by mouth daily after breakfast.    Yes Historical Provider, MD  cyanocobalamin (,VITAMIN B-12,) 1000 MCG/ML injection Inject 1,000 mcg into the muscle as needed (  for energy).   Yes Historical Provider, MD  cyclobenzaprine (FLEXERIL) 10 MG tablet Take 10 mg by mouth See admin instructions. Patient make take 10 mg  twice a day as needed for spasms. Patient takes 10 mg at bedtime each day.   Yes Historical Provider, MD  diphenhydrAMINE (BENADRYL) 25 MG tablet Take 50 mg by mouth See admin instructions. Patient may take 50 mg three times a day as needed. Patient takes 50 mg at bedtime each day.   Yes Historical Provider, MD  docusate sodium (COLACE) 100 MG capsule Take 100 mg by mouth 3 (three) times daily with meals.    Yes Historical Provider, MD  estrogens, conjugated, (PREMARIN) 0.625 MG tablet Take 0.625 mg by mouth daily after breakfast.    Yes Historical Provider, MD  famotidine (PEPCID) 20 MG tablet Take 20 mg by mouth 2 (two) times daily before a meal.   Yes Historical Provider, MD  fexofenadine (ALLEGRA) 180 MG tablet Take 180 mg by mouth daily after breakfast.    Yes Historical Provider, MD  fluoxymesterone (ANDROXY) 10 MG tablet Take 2.5 mg by mouth daily as needed (before sexual activity).    Yes Historical Provider, MD  furosemide (LASIX) 40 MG tablet Take 80 mg by mouth daily after breakfast.  10/12/12  Yes  Ripudeep K Rai, MD  levETIRAcetam (KEPPRA) 500 MG tablet TAKE 1 TABLET TWICE A DAY 12/25/12  Yes Pramod S Sethi, MD  mineral oil liquid Take 60 mLs by mouth at bedtime. With Juice   Yes Historical Provider, MD  nitroGLYCERIN (NITROSTAT) 0.4 MG SL tablet Place 0.4 mg under the tongue every 5 (five) minutes as needed for chest pain.   Yes Historical Provider, MD  oxyCODONE (OXYCONTIN) 10 MG 12 hr tablet Take 1 tablet (10 mg total) by mouth every 12 (twelve) hours as needed (for breakthrough pain). Brand name medically necessary. 12/20/12  Yes Peter R Ennever, MD  oxyCODONE (OXYCONTIN) 20 MG 12 hr tablet Take 1 tablet (20 mg total) by mouth every 8 (eight) hours. Brand name medically necessary. 12/20/12  Yes Peter R Ennever, MD  oxyCODONE (ROXICODONE) 15 MG immediate release tablet Take 1 tablet (15 mg total) by mouth every 8 (eight) hours as needed (for breakthrough pain). Brand name medically necessary. 12/20/12  Yes Peter R Ennever, MD  potassium chloride (MICRO-K) 10 MEQ CR capsule Take 80 mEq by mouth daily after breakfast.   Yes Historical Provider, MD  Sennosides (SENNA LAX PO) Take 1 tablet by mouth 3 (three) times daily with meals.    Yes Historical Provider, MD  temazepam (RESTORIL) 30 MG capsule Take 30 mg by mouth at bedtime.    Yes Historical Provider, MD  thyroid (ARMOUR) 60 MG tablet Take 150 mg by mouth daily after breakfast. Levothyroxine is not effective. Takes only armour thyroid.   Yes Historical Provider, MD    Current Facility-Administered Medications  Medication Dose Route Frequency Provider Last Rate Last Dose  . acetaminophen (TYLENOL) tablet 650 mg  650 mg Oral Q6H PRN Anand D Hongalgi, MD       Or  . acetaminophen (TYLENOL) suppository 650 mg  650 mg Rectal Q6H PRN Anand D Hongalgi, MD      . albuterol-ipratropium (COMBIVENT) inhaler 2 puff  2 puff Inhalation Q6H PRN Anand D Hongalgi, MD      . ALPRAZolam (XANAX) tablet 0.5 mg  0.5 mg Oral QHS Anand D Hongalgi, MD   0.5 mg at  12/31/12 2106  . aspirin tablet 325   mg  325 mg Oral QPC breakfast Anand D Hongalgi, MD   325 mg at 01/01/13 1013  . bisoprolol (ZEBETA) tablet 10 mg  10 mg Oral QPC breakfast Peter R Ennever, MD   10 mg at 01/01/13 1012  . clopidogrel (PLAVIX) tablet 75 mg  75 mg Oral QPC breakfast Anand D Hongalgi, MD   75 mg at 01/01/13 1013  . cyclobenzaprine (FLEXERIL) tablet 10 mg  10 mg Oral BID PRN Anand D Hongalgi, MD   10 mg at 01/01/13 0825  . cyclobenzaprine (FLEXERIL) tablet 10 mg  10 mg Oral QHS Anand D Hongalgi, MD   10 mg at 12/31/12 2106  . diphenhydrAMINE (BENADRYL) capsule 50 mg  50 mg Oral TID PRN Michelle T Bell, RPH   50 mg at 01/01/13 1315  . docusate sodium (COLACE) capsule 100 mg  100 mg Oral TID WC Anand D Hongalgi, MD   100 mg at 01/01/13 1243  . estrogens (conjugated) (PREMARIN) tablet 0.625 mg  0.625 mg Oral QPC breakfast Anand D Hongalgi, MD   0.625 mg at 01/01/13 1013  . feeding supplement (ENSURE COMPLETE) liquid 237 mL  237 mL Oral BID BM Belkys A Regalado, MD   237 mL at 12/29/12 0922  . feeding supplement (PRO-STAT SUGAR FREE 64) liquid 30 mL  30 mL Oral BID AC Reanne J Barnett, RD   30 mL at 12/30/12 0811  . feeding supplement (RESOURCE BREEZE) liquid 1 Container  1 Container Oral TID WC Reanne J Barnett, RD   1 Container at 12/30/12 1214  . levETIRAcetam (KEPPRA) tablet 500 mg  500 mg Oral BID Anand D Hongalgi, MD   500 mg at 01/01/13 1013  . loratadine (CLARITIN) tablet 10 mg  10 mg Oral Daily Anand D Hongalgi, MD   10 mg at 01/01/13 1013  . mineral oil liquid 60 mL  60 mL Oral QHS Anand D Hongalgi, MD   60 mL at 12/31/12 2108  . multivitamin with minerals tablet 1 tablet  1 tablet Oral Daily Reanne J Barnett, RD   1 tablet at 01/01/13 1013  . nitroGLYCERIN (NITROSTAT) SL tablet 0.4 mg  0.4 mg Sublingual Q5 min PRN Anand D Hongalgi, MD      . ondansetron (ZOFRAN) tablet 4 mg  4 mg Oral Q6H PRN Anand D Hongalgi, MD       Or  . ondansetron (ZOFRAN) injection 4 mg  4 mg  Intravenous Q6H PRN Anand D Hongalgi, MD   4 mg at 12/31/12 1325  . oxyCODONE (Oxy IR/ROXICODONE) immediate release tablet 15 mg  15 mg Oral Q8H PRN Anand D Hongalgi, MD   15 mg at 12/31/12 1734  . OxyCODONE (OXYCONTIN) 12 hr tablet 20 mg  20 mg Oral Q8H Michelle T Bell, RPH   20 mg at 01/01/13 1243  . pantoprazole (PROTONIX) EC tablet 40 mg  40 mg Oral Daily Rachel Lynn Rumbarger, RPH   40 mg at 01/01/13 1012  . senna (SENOKOT) tablet 8.6 mg  1 tablet Oral TID WC Anand D Hongalgi, MD   8.6 mg at 01/01/13 1243  . senna-docusate (Senokot-S) tablet 2 tablet  2 tablet Oral QHS Michelle T Bell, RPH   2 tablet at 12/31/12 2105  . sodium chloride 0.9 % injection 10-40 mL  10-40 mL Intracatheter PRN Anand D Hongalgi, MD      . sodium chloride 0.9 % injection 10-40 mL  10-40 mL Intracatheter PRN Belkys A Regalado, MD   10 mL at   01/01/13 0619  . sodium chloride 0.9 % injection 3 mL  3 mL Intravenous Q12H Anand D Hongalgi, MD   3 mL at 12/31/12 0905  . temazepam (RESTORIL) capsule 30 mg  30 mg Oral QHS Belkys A Regalado, MD      . thyroid (ARMOUR) tablet 150 mg  150 mg Oral QPC breakfast Anand D Hongalgi, MD   150 mg at 01/01/13 1013    Allergies as of 12/28/2012 - Review Complete 12/28/2012  Allergen Reaction Noted  . Codeine Nausea Only 09/21/2010  . Synthroid [levothyroxine sodium] Other (See Comments) 09/28/2010  . Calcium channel blockers Palpitations 09/21/2010  . Penicillins Rash 09/21/2010   Review of Systems:    All systems reviewed and negative except where noted in HPI.    Physical Exam:  Vital signs in last 24 hours: Temp:  [98.2 F (36.8 C)-98.5 F (36.9 C)] 98.5 F (36.9 C) (10/06 0621) Pulse Rate:  [63-103] 81 (10/06 0621) Resp:  [16] 16 (10/06 0621) BP: (112-131)/(55-73) 127/57 mmHg (10/06 0621) SpO2:  [96 %-100 %] 96 % (10/06 0621) Last BM Date: 12/30/12 General:   Pleasant white female in NAD Head:  Normocephalic and atraumatic. Eyes:   No icterus.   Conjunctiva  pink. Ears:  Normal auditory acuity. Neck:  Supple; no masses felt Lungs:  Respirations even and unlabored. Lungs clear to auscultation bilaterally.   No wheezes, crackles, or rhonchi.  Heart:  Regular rate and rhythm Abdomen:  Soft, nondistended, nontender. Normal bowel sounds. No appreciable masses or hepatomegaly. Mild RUQ tenderness Rectal:  Not performed.  Msk:  Symmetrical without gross deformities.  Extremities:  Without edema. Neurologic:  Alert and  oriented x4;  grossly normal neurologically. Skin: Pallor, Intact without significant lesions or rashes. Cervical Nodes:  No significant cervical adenopathy. Psych:  Alert and cooperative. Normal affect.  LAB RESULTS:  Recent Labs  12/30/12 0510 12/31/12 0500 01/01/13 0619  WBC 4.7 4.0 3.4*  HGB 4.4* 4.9* 4.9*  HCT 15.1* 16.8* 17.1*  PLT 214 194 191   BMET  Recent Labs  12/30/12 0510 01/01/13 0619  NA 136 137  K 4.0 3.7  CL 107 105  CO2 23 27  GLUCOSE 85 90  BUN 6 3*  CREATININE 0.50 0.49*  CALCIUM 7.7* 7.6*    PREVIOUS ENDOSCOPIES:            remote colonoscopy ([name]).   Impression / Plan:   1. 67 year old female with multiple medical problems as described above.   2. Nausea, vomiting, weight loss. She had a complicated cholecystectomy in July but surgery doesn't feel symptoms are related. Etiology unclear. Some diagnostics considerations include PUD /gastritis (especially given report of black stool), partial gastric outlet obstruction, gastroparesis, intestinal neoplasm. For further evaluation patient will be scheduled for EGD to be done tomorrow. The benefits, risks, and potential complications of EGD with possible biopsieswere discussed with the patient and she agrees to proceed.   3. Polycythemia Vera, followed closely by Dr. Ennever. Hgb down to 3.0 from her baseline of 4-5. She gets phlebotomies. Patient has been transfused this admission. Hgb back to baseline (4.9)  4. Chronic Plavix. Diagnostic EGD  to be done on Plavix  5. History of CVA in July  6. Recent open cholecystectomy with CBD exploration / stone removal. T-Tube in place. For T-tube cholangiogram on 10/13.  LFTs normal.     Thanks   LOS: 4 days   [name]  01/01/2013, 2:03 PM  Patient was seen and   examined, x-rays were reviewed.  GI problems include intractable nausea and vomiting, and anemia.  Patient did note black stools over the past 2 days suggesting that she has had a subacute GI bleed.  She is certainly at increased risk for stress gastritis or ulceration.  Bleeding from a lower GI source must also be considered.  Regarding nausea and vomiting, patient has a lifetime history of a queasy stomach with very low threshold for nausea and vomiting.  She's been taking narcotics for years rendering narcotics as a source for nausea unlikely.  She may have a hypersensitivity to pain stimulus causing nausea.  Recommendations #1 upper endoscopy #2 colonoscopy if patient is Hemoccult positive and upper endoscopy is not diagnostic #3 trial of long-acting anticholinergics for nausea - hyomax #4 continue protonix       

## 2013-01-02 NOTE — Progress Notes (Addendum)
Grade a erosive esophagitis by upper endoscopy.  Changes are probably a result of vomiting.  Recommendations 1.  continue hyomax and protonix 2.  Hemoccults; if positive would proceed with colonoscopy as outpatient

## 2013-01-02 NOTE — Telephone Encounter (Signed)
Husband called cx 10-8 appointment pt is in the hospital. I tried to call back to see if they wanted to reschedule. No answer or voice mail

## 2013-01-02 NOTE — Progress Notes (Signed)
She ate yesterday and did not have any vomiting. She's going for EGD today. She is out of bed walking a little bit yesterday. I think that the more she exercises, do better she will be a will to digest.   Continues on Cipro. I wonder if this could be stopped. So far, cultures are negative.  She's not bleeding. There is no fever. Her appetite is a little bit better.  Her hemoglobin is holding steady at 4.8.  Having a little bit more swelling in her legs. Possibly, Lasix can be restarted.  All vital signs are stable. Blood pressure 120/60.  She good bowel sounds. The abdomen is soft. There is still some slight tenderness in the right upper quadrant. No fluid wave is noted. Cardiac exam regular rate and rhythm.  Hopefully, she will be a will to go home in one or 2 days. I think that the more she is out of bed, the better she will improve.  Pete E.

## 2013-01-03 ENCOUNTER — Other Ambulatory Visit: Payer: Medicare Other | Admitting: Lab

## 2013-01-03 ENCOUNTER — Encounter (HOSPITAL_COMMUNITY): Payer: Self-pay | Admitting: Gastroenterology

## 2013-01-03 ENCOUNTER — Ambulatory Visit: Payer: Medicare Other | Admitting: Hematology & Oncology

## 2013-01-03 ENCOUNTER — Other Ambulatory Visit: Payer: Self-pay | Admitting: Hematology & Oncology

## 2013-01-03 ENCOUNTER — Telehealth (INDEPENDENT_AMBULATORY_CARE_PROVIDER_SITE_OTHER): Payer: Self-pay

## 2013-01-03 DIAGNOSIS — R112 Nausea with vomiting, unspecified: Secondary | ICD-10-CM

## 2013-01-03 DIAGNOSIS — E43 Unspecified severe protein-calorie malnutrition: Secondary | ICD-10-CM

## 2013-01-03 DIAGNOSIS — IMO0001 Reserved for inherently not codable concepts without codable children: Secondary | ICD-10-CM

## 2013-01-03 DIAGNOSIS — K810 Acute cholecystitis: Secondary | ICD-10-CM

## 2013-01-03 MED ORDER — HEPARIN SOD (PORK) LOCK FLUSH 100 UNIT/ML IV SOLN
250.0000 [IU] | INTRAVENOUS | Status: AC | PRN
  Administered 2013-01-03: 500 [IU]

## 2013-01-03 MED ORDER — ONDANSETRON HCL 8 MG PO TABS: 8.0000 mg | ORAL_TABLET | Freq: Three times a day (TID) | ORAL | Status: DC | PRN

## 2013-01-03 MED ORDER — BOOST PLUS PO LIQD
237.0000 mL | Freq: Two times a day (BID) | ORAL | Status: DC
  Filled 2013-01-03 (×2): qty 237

## 2013-01-03 MED ORDER — PANTOPRAZOLE SODIUM 40 MG PO TBEC: 40.0000 mg | DELAYED_RELEASE_TABLET | Freq: Every day | ORAL | Status: DC

## 2013-01-03 NOTE — Discharge Summary (Signed)
Physician Discharge Summary  [name] FAO:[id number] DOB: [birth year] DOA: 12/28/2012  PCP: [name], MD  Admit date: 12/28/2012 Discharge date: 01/03/2013  Time spent: 35 minute  Recommendations for Outpatient Follow-up:  1. Please followup on repeat CBC, patient with history of polycythemia, hemoglobin goal between 4-5. 2. 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 female with extensive past medical and surgical history, recently underwent open cholecystectomy with common duct exploration and placement of a JP drain and T-tube. Past medical history includes but not limited to polycythemia vera (hematologist apparently aims to keep hemoglobin between 5-6 g/dL, a recent PRBC transfusion x1, phlebotomies, of fibromyalgia and chronic pain syndrome, hyperlipidemia, CVA, GERD, hypertension, seizure, hypothyroidism and a left upper arm PICC line. She was seen by the surgeons on 12/27/2012. Her appetite has been quite poor since the surgery with intermittent nausea and vomiting. She has lost 8-10 pounds over the last 4 weeks. Overall she was doing reasonably well and her JP drain was removed yesterday. Since last night, patient started having several episodes of nausea and vomiting. Emesis consisted of food and bilious material. There was no coffee grounds or blood. Patient  had normal BM yesterday. No diarrhea or constipation. Patient has had chronic right upper quadrant intermittent pain since the surgery which has not changed. She did complain of some left flank pain which is only present when she takes a deep breath or during vomiting episodes. She has urinary incontinence but denies dysuria or urinary frequency. She denies fever or chills. She had significant diaphoresis during the emesis episodes. No history of loss of consciousness or falls. No history of eating anything unusual or sickly contacts. In the ED, patient was hypotensive, tachycardic, hemoglobin 4.1. She was bolused with 2 L of IV fluids with resolution of her hypotension and improvement in tachycardia. Patient has not had any further episodes of vomiting for the last 2-3 hours. Last seizure was in mid July 2014. Hospitalist admission was requested.   Hospital Course:  Patient is a pleasant 67 year old female with multiple comorbidities including polycythemia, undergoing weekly phlebotomies, history of CVA who underwent cholecystostomy status post placement of JP drain and T-tube. Patient was last evaluated by general surgery on 12/27/2012. She was admitted on 12/28/2012, presented with complaints of nausea vomiting, poor by mouth intake and having an overall functional decline. On presentation patient was hypotensive, tachycardic, with a hemoglobin of 4.1. She was given a fluid challenge with normal saline which resulted in resolution to hypotension and tachycardia. Lab work showed the presence of urinary tract infection for which she was started on empiric antibiotic therapy. Patient had a noncontrast CT scan which demonstrated pigtail drainage catheter in place with tip in the gallbladder fossa. General surgery did not feel that patient's current presentation was related to recent gallbladder surgery. Patient undergoing transfusion with 1 unit of packed red blood cells during this hospitalization  as her  hemoglobin was found to be lower than her baseline. Patient reported black stools for which gastroenterology was consulted. She underwent EGD, procedure performed on 01/02/2013 by Dr. Arlyce Dice which showed erosive esophagitis. Dr. Arlyce Dice recommending ongoing PPI therapy. He also recommended checking Hemoccults which came back positive, and recommended colonoscopy as an outpatient. Patient may well showing symptomatic improvement as nausea vomiting result. She was able tolerate by mouth intake, and by 01/03/2013 reported feeling well enough to be discharged. I went over this with her husband who agreed with discharge planning.  Procedures:  EGD performed on 01/02/2013 impression: Grade A erosive esophagitis  Consultations:  Gastroenterology  Hematology  Discharge Exam: Filed Vitals:   01/03/13 0509  BP: 112/56  Pulse: 80  Temp: 97.7 F (36.5 C)  Resp: 18    General: Patient is in no acute distress, she is awake alert, expressed a desire to be discharged today. Cardiovascular: 2/6 systolic ejection murmur, regular rate rhythm normal S1-S2 Respiratory: Lungs are clear to auscultation bilaterally no wheezing rhonchi or rales Abdomen: Soft nontender nondistended positive bowel sound Extremities: Patient having trace edema to extremities bilaterally.  Discharge Instructions  Discharge Orders   Future Appointments Provider Department Dept Phone   01/08/2013 10:00 AM [facility] INTERVENTIONAL RADIOLOGY [phone]   01/09/2013 11:30 AM Cherylynn Ridges, MD [facility], [place] [phone]   01/15/2013 1:45 PM [name], MD [facility] [place] Office [phone]   01/17/2013 1:00 PM Rachael Fee [facility] [phone]   01/23/2013 1:00 PM Gearldine Shown [facility] [phone]   02/07/2013 2:00 PM [name], MD [facility] [place] Office [phone]   04/05/2013 2:00 PM Ronal Fear, NP [facility] [phone]   05/03/2013 1:00 PM [facility] [place] [facility] [address] [phone]   05/03/2013 2:00 PM Sherren Kerns, MD [facility] [phone]   Future Orders Complete By Expires   Call MD for:  difficulty breathing, headache or visual disturbances  As directed    Call MD for:  persistant dizziness or light-headedness  As directed    Call MD for:  persistant nausea and vomiting  As directed    Call MD for:  redness, tenderness, or signs of infection (pain, swelling, redness, odor or green/yellow discharge around incision site)  As directed    Call MD for:  temperature >100.4  As directed    Diet - low sodium heart healthy  As directed    Discharge instructions  As directed    Comments:     Please keep you hospital follow up appointments   Increase activity slowly  As directed        Medication List         albuterol-ipratropium 18-103 MCG/ACT inhaler  Commonly known as:  COMBIVENT  Inhale 2 puffs into the lungs every 6 (six) hours as needed for wheezing.     ALPRAZolam 0.5 MG tablet  Commonly known as:  XANAX  Take 0.5 mg by mouth at bedtime.     ANDROXY 10 MG tablet  Generic drug:  fluoxymesterone  Take 2.5 mg by mouth daily as needed (before sexual activity).     aspirin 325 MG tablet  Take 325 mg by mouth daily after breakfast.     bisoprolol 10 MG tablet  Commonly known as:  ZEBETA  Take 10 mg by mouth daily after breakfast.     Cascara  Sagrada 450 MG Caps  Take 900 mg by mouth at bedtime.     cholecalciferol 1000 UNITS tablet  Commonly known as:  VITAMIN D  Take 3,000 Units by mouth daily after breakfast.     clopidogrel 75 MG tablet  Commonly known as:  PLAVIX  Take 75 mg by mouth daily after breakfast.     cyanocobalamin 1000 MCG/ML injection  Commonly known as:  (VITAMIN B-12)  Inject 1,000 mcg into the muscle as needed (for energy).     cyclobenzaprine 10 MG tablet   Commonly known as:  FLEXERIL  Take 10 mg by mouth See admin instructions. Patient make take 10 mg  twice a day as needed for spasms. Patient takes 10 mg at bedtime each day.     diphenhydrAMINE 25 MG tablet  Commonly known as:  BENADRYL  Take 50 mg by mouth See admin instructions. Patient may take 50 mg three times a day as needed. Patient takes 50 mg at bedtime each day.     docusate sodium 100 MG capsule  Commonly known as:  COLACE  Take 100 mg by mouth 3 (three) times daily with meals.     famotidine 20 MG tablet  Commonly known as:  PEPCID  Take 20 mg by mouth 2 (two) times daily before a meal.     fexofenadine 180 MG tablet  Commonly known as:  ALLEGRA  Take 180 mg by mouth daily after breakfast.     furosemide 40 MG tablet  Commonly known as:  LASIX  Take 80 mg by mouth daily after breakfast.     levETIRAcetam 500 MG tablet  Commonly known as:  KEPPRA  TAKE 1 TABLET TWICE A DAY     mineral oil liquid  Take 60 mLs by mouth at bedtime. With Juice     nitroGLYCERIN 0.4 MG SL tablet  Commonly known as:  NITROSTAT  Place 0.4 mg under the tongue every 5 (five) minutes as needed for chest pain.     oxyCODONE 20 MG 12 hr tablet  Commonly known as:  OXYCONTIN  Take 1 tablet (20 mg total) by mouth every 8 (eight) hours. Brand name medically necessary.     oxyCODONE 15 MG immediate release tablet  Commonly known as:  ROXICODONE  Take 1 tablet (15 mg total) by mouth every 8 (eight) hours as needed (for breakthrough pain). Brand name medically necessary.     oxyCODONE 10 MG 12 hr tablet  Commonly known as:  OXYCONTIN  Take 1 tablet (10 mg total) by mouth every 12 (twelve) hours as needed (for breakthrough pain). Brand name medically necessary.     pantoprazole 40 MG tablet  Commonly known as:  PROTONIX  Take 1 tablet (40 mg total) by mouth daily.     potassium chloride 10 MEQ CR capsule  Commonly known as:  MICRO-K  Take 80 mEq by mouth daily after breakfast.      PREMARIN 0.625 MG tablet  Generic drug:  estrogens (conjugated)  Take 0.625 mg by mouth daily after breakfast.     SENNA LAX PO  Take 1 tablet by mouth 3 (three) times daily with meals.     temazepam 30 MG capsule  Commonly known as:  RESTORIL  Take 30 mg by mouth at bedtime.     thyroid 60 MG tablet  Commonly known as:  ARMOUR  Take 150 mg by mouth daily after breakfast. Levothyroxine is not effective. Takes only armour thyroid.  Allergies  Allergen Reactions  . Codeine Nausea Only  . Synthroid [Levothyroxine Sodium] Other (See Comments)    Not effective. Causes excessive sleepiness. Takes armour thyroid  . Calcium Channel Blockers Palpitations  . Penicillins Rash       Follow-up Information   Follow up with [name], MD.   Specialty:  Cardiology   Contact information:   [address] [place] [address] [phone]       Follow up with [name], MD.   Specialty:  Gastroenterology   Contact information:   [address] [place] [address] [phone]        The results of significant diagnostics from this hospitalization (including imaging, microbiology, ancillary and laboratory) are listed below for reference.    Significant Diagnostic Studies: Ct Abdomen Pelvis Wo Contrast  12/29/2012   CLINICAL DATA:  Low hemoglobin. Recent cholecystectomy. Evaluate for hematoma  EXAM: CT ABDOMEN AND PELVIS WITHOUT CONTRAST  TECHNIQUE: Multidetector CT imaging of the abdomen and pelvis was performed following the standard protocol without intravenous contrast.  COMPARISON:  09/30/2012.  FINDINGS: No pleural effusion identified.  The lung bases appear clear.  There is no focal liver abnormality identified. The patient is status post cholecystectomy. There is a pigtail drainage catheter with pigtail in the gallbladder fossa. There is a small amount of fluid which surrounds the intra-abdominal portion of the catheter. There is also a small  amount of fluid overlying the right hepatic lobe. No large hematoma identified. The pancreas is on unremarkable. The spleen is normal.  Normal appearance of both adrenal glands. The kidneys are both on unremarkable. Urinary bladder appears normal. Previous hysterectomy.  Normal caliber of the abdominal aorta. No upper abdominal adenopathy. There is no pelvic or inguinal adenopathy identified. No retroperitoneal hematoma identified. No pelvic hematoma or fluid collection noted.  The stomach appears normal. The small bowel loops are unremarkable. No evidence for bowel obstruction. The appendix is visualized and appears normal. Normal appearance of the colon.  Review of the visualized bony structures is significant for mild spondylosis within the lumbar spine. No aggressive lytic or sclerotic bone lesions identified.  IMPRESSION: 1. Status post cholecystectomy.  2. Pigtail drainage catheter is in place with tip in the gallbladder fossa. A small amount of fluid is identified surrounding the intra-abdominal portions of the catheter in around the right hepatic lobe.  3. No significant hematoma identified to account for the patient's low hemoglobin.   Electronically Signed   By: Signa Kell M.D.   On: 12/29/2012 08:49   Dg Chest Port 1 View  12/28/2012   CLINICAL DATA:  Nausea, weakness.  EXAM: PORTABLE CHEST - 1 VIEW  COMPARISON:  11/21/2012  FINDINGS: Left PICC line remains in place, unchanged. Heart and mediastinal contours are within normal limits. No focal opacities or effusions. No acute bony abnormality.  IMPRESSION: No active disease.   Electronically Signed   By: Charlett Nose M.D.   On: 12/28/2012 04:11    Microbiology: Recent Results (from the past 240 hour(s))  URINE CULTURE     Status: None   Collection Time    12/28/12 12:04 PM      Result Value Range Status   Specimen Description URINE, RANDOM   Final   Special Requests NONE   Final   Culture  Setup Time     Final   Value: 12/28/2012 13:02      Performed at [facility] Count  Final   Value: 9,000 COLONIES/ML     Performed at [facility]   Culture     Final   Value: INSIGNIFICANT GROWTH     Performed at [facility]   Report Status 12/29/2012 FINAL   Final     Labs: Basic Metabolic Panel:  Recent Labs Lab 12/28/12 0437 12/29/12 0440 12/30/12 0510 01/01/13 0619  NA 138 135 136 137  K 4.4 3.6 4.0 3.7  CL 100 107 107 105  CO2  --  23 23 27   GLUCOSE 183* 87 85 90  BUN 26* 15 6 3*  CREATININE 0.90 0.57 0.50 0.49*  CALCIUM  --  7.6* 7.7* 7.6*   Liver Function Tests:  Recent Labs Lab 12/28/12 0740 12/29/12 0440  AST 9 8  ALT <5 <5  ALKPHOS 65 50  BILITOT 0.2* 0.4  PROT 5.0* 4.2*  ALBUMIN 2.1* 1.8*    Recent Labs Lab 12/28/12 0740  LIPASE 18   No results found for this basename: AMMONIA,  in the last 168 hours CBC:  Recent Labs Lab 12/28/12 0424  12/28/12 0740  12/29/12 1600 12/30/12 0510 12/31/12 0500 01/01/13 0619 01/02/13 0453  WBC 7.8  --  3.6*  < > 6.6 4.7 4.0 3.4* 2.9*  NEUTROABS 6.7  --  2.7  --   --   --   --   --   --   HGB 4.1*  < > 3.0*  < > 4.9* 4.4* 4.9* 4.9* 4.8*  HCT 16.1*  < > 11.8*  < > 16.4* 15.1* 16.8* 17.1* 16.8*  MCV 63.6*  --  63.8*  < > 71.6* 71.2* 71.5* 72.2* 71.8*  PLT 520*  --  242  < > 214 214 194 191 189  < > = values in this interval not displayed. Cardiac Enzymes: No results found for this basename: CKTOTAL, CKMB, CKMBINDEX, TROPONINI,  in the last 168 hours BNP: BNP (last 3 results)  Recent Labs  10/05/12 0455  PROBNP 2326.0*   CBG: No results found for this basename: GLUCAP,  in the last 168 hours     Signed:  Jeralyn Bennett  [facility] 01/03/2013, 11:23 AM

## 2013-01-03 NOTE — Progress Notes (Signed)
PT Cancellation Note  Patient Details Name: [name] MRN: [id number] DOB: 03-Apr-1945   Cancelled Treatment:    Reason Eval/Treat Not Completed: Patient declined, no reason specified.  Pt declining any mobility at this time stating she wants to let her food settle in anticipation of going home later today.  Will f/u this pm if pt does not D/C.     Sunny Schlein, [place] [phone] 01/03/2013, 8:46 AM

## 2013-01-03 NOTE — Progress Notes (Signed)
Discharge instructions reviewed with pt and pt's husband and prescription given.  Pt and pt's husband verbalized understanding and questions answered.  Pt discharged in stable condition via wheelchair with husband.  [name]   

## 2013-01-03 NOTE — Telephone Encounter (Signed)
Husband is asking for appointment for IR for drain removal, [facility] is 01-08-13@10am  . He states he not sure if he can make that date and time ,advised him to call if appointment needs to be changed.

## 2013-01-03 NOTE — Progress Notes (Signed)
[facility] Progress Note   Subjective  Ate a small amount today. No abdominal pain. Wants to go home.    Objective   Vital signs in last 24 hours: Temp:  [97.7 F (36.5 C)-98.6 F (37 C)] 97.7 F (36.5 C) (10/08 0509) Pulse Rate:  [65-88] 80 (10/08 0509) Resp:  [14-22] 18 (10/08 0509) BP: (112-146)/(43-84) 112/56 mmHg (10/08 0509) SpO2:  [16 %-100 %] 98 % (10/08 0509) Last BM Date: 01/02/13 General:    Pleasant white female in NAD Abdomen:  Soft, nontender and nondistended. Normal bowel sounds. Neurologic:  Alert and oriented,  grossly normal neurologically. Psych:  Cooperative. Normal mood and affect.  Lab Results:  Recent Labs  01/01/13 0619 01/02/13 0453  WBC 3.4* 2.9*  HGB 4.9* 4.8*  HCT 17.1* 16.8*  PLT 191 189   BMET  Recent Labs  01/01/13 0619  NA 137  K 3.7  CL 105  CO2 27  GLUCOSE 90  BUN 3*  CREATININE 0.49*  CALCIUM 7.6*     Assessment / Plan:   32. 67 year old female with multiple medical problems    2. Nausea, vomiting, weight loss since around July. EGD was unrevaling.  She started Keppra around July but not sure that nausea / vomiting is common side effect. Patient describes emesis of 'pureed" consistency. She often vomits large amounts despite eating very little at a time.   She is on chronic narcotics so delayed gastric emptying is possible. It would be difficult to get her off narcotics to get gastric emptying study, It may be worth a trial of low dose Reglan. Given history of seizures this should probably be discussed with neuro. I will call neurologist's office to discuss. I did discuss potential side effects of Reglan with patient. If Reglan helps we could try domperidone from [place].   In the meantime at home, would try scheduled BID Zofran with phenergan suppositories as needed for breakthrough.    3. Mild esophagitis on EGD, likely secondary to vomiting  4. Polycythemia Vera, followed closely by Dr. [name].   5.  Chronic Plavix.   6. History of CVA in July   7. Recent open cholecystectomy with CBD exploration / stone removal. T-Tube in place. For T-tube cholangiogram on 10/13. LFTs normal.     LOS: 6 days   Willette Cluster  01/03/2013, 10:05 AM

## 2013-01-03 NOTE — Progress Notes (Signed)
OT Cancellation Note  Patient Details Name: MATHA MASSE MRN: [id number] DOB: 09/07/1945   Cancelled Treatment:    Reason Eval/Treat Not Completed: Other (comment) Pt declining getting up today as she does not want to vomit and let her food settle before going home.   Earlie Raveling OTR/L [phone] 01/03/2013, 10:10 AM

## 2013-01-04 ENCOUNTER — Telehealth: Payer: Self-pay | Admitting: Hematology & Oncology

## 2013-01-04 ENCOUNTER — Encounter (HOSPITAL_COMMUNITY): Payer: Self-pay | Admitting: Pharmacy Technician

## 2013-01-04 NOTE — Telephone Encounter (Signed)
Pt made 11-4 MD appointment and is keeping 10-22 lab

## 2013-01-05 ENCOUNTER — Telehealth (INDEPENDENT_AMBULATORY_CARE_PROVIDER_SITE_OTHER): Payer: Self-pay | Admitting: *Deleted

## 2013-01-05 NOTE — Progress Notes (Signed)
I have personally taken an interval history, reviewed the chart, and examined the patient.  I agree with the extender's note, impression and recommendations.  Aiman Sonn D. Rose Hippler, MD, FACG [facility] [phone]  

## 2013-01-05 NOTE — Telephone Encounter (Signed)
I spoke with pt to inform her of her appt at [facility] on 01/08/13 at 10:00.

## 2013-01-08 ENCOUNTER — Ambulatory Visit (HOSPITAL_COMMUNITY)
Admit: 2013-01-08 | Discharge: 2013-01-08 | Disposition: A | Discharge status: Home / Self Care | Payer: Medicare Other | Source: Ambulatory Visit | Attending: General Surgery | Admitting: General Surgery

## 2013-01-08 ENCOUNTER — Other Ambulatory Visit: Payer: Medicare Other

## 2013-01-08 DIAGNOSIS — Z9889 Other specified postprocedural states: Secondary | ICD-10-CM

## 2013-01-08 DIAGNOSIS — Q4479 Other congenital malformations of liver: Secondary | ICD-10-CM | POA: Insufficient documentation

## 2013-01-08 DIAGNOSIS — Q441 Other congenital malformations of gallbladder: Secondary | ICD-10-CM | POA: Insufficient documentation

## 2013-01-08 DIAGNOSIS — K810 Acute cholecystitis: Secondary | ICD-10-CM | POA: Insufficient documentation

## 2013-01-08 MED ORDER — IOHEXOL 300 MG/ML  SOLN
50.0000 mL | Freq: Once | INTRAMUSCULAR | Status: AC | PRN
  Administered 2013-01-08: 10 mL via INTRAVENOUS

## 2013-01-08 NOTE — Procedures (Signed)
Fluoroscopic guided T-tube cholangiogram demonstrates wide patency of the cystic duct to the level of the duodenum.  No discrete filing defects within the opacified portions of the biliary tree to suggest choledocholithiasis.

## 2013-01-09 ENCOUNTER — Ambulatory Visit (INDEPENDENT_AMBULATORY_CARE_PROVIDER_SITE_OTHER): Payer: Medicare Other | Admitting: General Surgery

## 2013-01-09 ENCOUNTER — Encounter (INDEPENDENT_AMBULATORY_CARE_PROVIDER_SITE_OTHER): Payer: Self-pay | Admitting: General Surgery

## 2013-01-09 VITALS — BP 114/60 | HR 80 | Temp 97.6°F | Resp 20 | Ht 63.0 in | Wt 150.4 lb

## 2013-01-09 DIAGNOSIS — Z09 Encounter for follow-up examination after completed treatment for conditions other than malignant neoplasm: Secondary | ICD-10-CM

## 2013-01-09 NOTE — Progress Notes (Addendum)
The patient's T-tube cholangiogram looked normal. She had good flow into duodenum. A was able to remove her G-tube today without event. He came out completely intact. There was a small amount of bilious drainage afterwards however has stopped prior to the patient leaving.  The patient should do well from this point on. She does not require a routine followup with me at this time. However if she's developed problems she can call me for an appointment.

## 2013-01-12 ENCOUNTER — Other Ambulatory Visit: Payer: Self-pay | Admitting: *Deleted

## 2013-01-12 DIAGNOSIS — M797 Fibromyalgia: Secondary | ICD-10-CM

## 2013-01-12 MED ORDER — OXYCODONE HCL 15 MG PO TABS: 15.0000 mg | ORAL_TABLET | Freq: Three times a day (TID) | ORAL | Status: DC | PRN

## 2013-01-12 MED ORDER — OXYCONTIN 20 MG PO T12A: 20.0000 mg | EXTENDED_RELEASE_TABLET | ORAL | Status: DC

## 2013-01-12 MED ORDER — OXYCODONE HCL ER 10 MG PO T12A: 10.0000 mg | EXTENDED_RELEASE_TABLET | Freq: Two times a day (BID) | ORAL | Status: DC

## 2013-01-12 NOTE — Telephone Encounter (Signed)
Pt coming in for an appt next week on 01/17/13 while Dr [name] is on vacation. She will need her pain medications refilled at that time. Routed rx's for Oxycontin and Oxy-IR dated for the pt to be able to fill on 01/17/13 to Dr [name] for approval and signature. Once signed will place at the front desk for pick up.

## 2013-01-15 ENCOUNTER — Ambulatory Visit: Payer: Medicare Other | Admitting: Cardiology

## 2013-01-16 ENCOUNTER — Telehealth: Payer: Self-pay | Admitting: Hematology & Oncology

## 2013-01-16 NOTE — Telephone Encounter (Signed)
Faxed signed and dated supplemental order to [facility] today to: [phone] P: [phone]    COPY SCANNED

## 2013-01-17 ENCOUNTER — Telehealth: Payer: Self-pay | Admitting: Hematology & Oncology

## 2013-01-17 ENCOUNTER — Other Ambulatory Visit (HOSPITAL_BASED_OUTPATIENT_CLINIC_OR_DEPARTMENT_OTHER): Payer: Medicare Other | Admitting: Lab

## 2013-01-17 DIAGNOSIS — T148XXA Other injury of unspecified body region, initial encounter: Secondary | ICD-10-CM

## 2013-01-17 DIAGNOSIS — R1011 Right upper quadrant pain: Secondary | ICD-10-CM

## 2013-01-17 DIAGNOSIS — D45 Polycythemia vera: Secondary | ICD-10-CM

## 2013-01-17 DIAGNOSIS — K810 Acute cholecystitis: Secondary | ICD-10-CM

## 2013-01-17 LAB — CBC WITH DIFFERENTIAL (CANCER CENTER ONLY)
BASO#: 0 10*3/uL (ref 0.0–0.2)
BASO%: 0.2 % (ref 0.0–2.0)
EOS%: 3.2 % (ref 0.0–7.0)
Eosinophils Absolute: 0.2 10*3/uL (ref 0.0–0.5)
HCT: 24.4 % — ABNORMAL LOW (ref 34.8–46.6)
HGB: 6.3 g/dL — CL (ref 11.6–15.9)
LYMPH#: 0.7 10*3/uL — ABNORMAL LOW (ref 0.9–3.3)
LYMPH%: 15.8 % (ref 14.0–48.0)
MCH: 18.5 pg — ABNORMAL LOW (ref 26.0–34.0)
MCHC: 25.8 g/dL — ABNORMAL LOW (ref 32.0–36.0)
MCV: 72 fL — ABNORMAL LOW (ref 81–101)
MONO#: 0.5 10*3/uL (ref 0.1–0.9)
MONO%: 10.4 % (ref 0.0–13.0)
NEUT#: 3.3 10*3/uL (ref 1.5–6.5)
NEUT%: 70.4 % (ref 39.6–80.0)
Platelets: 260 10*3/uL (ref 145–400)
RBC: 3.4 10*6/uL — ABNORMAL LOW (ref 3.70–5.32)
RDW: 23.9 % — ABNORMAL HIGH (ref 11.1–15.7)
WBC: 4.6 10*3/uL (ref 3.9–10.0)

## 2013-01-17 NOTE — Telephone Encounter (Signed)
PT AWARE OF 10-27 DRESSING CHANGE, PER RN AMY OK TO DO DRESSING CHANGE ON 11-4 WITH MD APPOINTMENT. PT HAS SCHEDULE

## 2013-01-22 ENCOUNTER — Telehealth: Payer: Self-pay | Admitting: Cardiology

## 2013-01-22 ENCOUNTER — Ambulatory Visit (HOSPITAL_BASED_OUTPATIENT_CLINIC_OR_DEPARTMENT_OTHER): Payer: Medicare Other

## 2013-01-22 DIAGNOSIS — D45 Polycythemia vera: Secondary | ICD-10-CM

## 2013-01-22 NOTE — Telephone Encounter (Signed)
Received will have  Dr. Patty Sermons sign and fax back

## 2013-01-22 NOTE — Progress Notes (Signed)
[name] presents today for phlebotomy per MD orders. Phlebotomy procedure started at 1250 and ended at 1313. 500 grams removed. Patient observed for 30 minutes after procedure without any incident. Patient tolerated procedure well. IV needle removed intact.

## 2013-01-22 NOTE — Telephone Encounter (Signed)
Requested from [place] for forms to be refaxed, unable to find

## 2013-01-22 NOTE — Patient Instructions (Signed)

## 2013-01-22 NOTE — Telephone Encounter (Signed)
New problem    Myriam Jacobson @ [facility]  Faxed on 12/26/12 Home Health Certification and Plan of Care signed and dated  Fax to [phone] Attn Myriam Jacobson

## 2013-01-23 ENCOUNTER — Other Ambulatory Visit (INDEPENDENT_AMBULATORY_CARE_PROVIDER_SITE_OTHER): Payer: Medicare Other | Admitting: Radiology

## 2013-01-23 DIAGNOSIS — I639 Cerebral infarction, unspecified: Secondary | ICD-10-CM

## 2013-01-23 DIAGNOSIS — R569 Unspecified convulsions: Secondary | ICD-10-CM

## 2013-01-30 ENCOUNTER — Other Ambulatory Visit (HOSPITAL_BASED_OUTPATIENT_CLINIC_OR_DEPARTMENT_OTHER): Payer: Medicare Other | Admitting: Lab

## 2013-01-30 ENCOUNTER — Encounter: Payer: Self-pay | Admitting: *Deleted

## 2013-01-30 ENCOUNTER — Ambulatory Visit (HOSPITAL_BASED_OUTPATIENT_CLINIC_OR_DEPARTMENT_OTHER): Payer: Medicare Other

## 2013-01-30 ENCOUNTER — Ambulatory Visit (HOSPITAL_BASED_OUTPATIENT_CLINIC_OR_DEPARTMENT_OTHER): Payer: Medicare Other | Admitting: Hematology & Oncology

## 2013-01-30 VITALS — BP 104/65 | HR 66 | Temp 97.8°F | Resp 16

## 2013-01-30 DIAGNOSIS — D45 Polycythemia vera: Secondary | ICD-10-CM

## 2013-01-30 DIAGNOSIS — K810 Acute cholecystitis: Secondary | ICD-10-CM

## 2013-01-30 DIAGNOSIS — R1011 Right upper quadrant pain: Secondary | ICD-10-CM

## 2013-01-30 DIAGNOSIS — Z452 Encounter for adjustment and management of vascular access device: Secondary | ICD-10-CM

## 2013-01-30 DIAGNOSIS — T148XXA Other injury of unspecified body region, initial encounter: Secondary | ICD-10-CM

## 2013-01-30 LAB — CBC WITH DIFFERENTIAL (CANCER CENTER ONLY)
BASO#: 0 10*3/uL (ref 0.0–0.2)
EOS%: 3.4 % (ref 0.0–7.0)
Eosinophils Absolute: 0.1 10*3/uL (ref 0.0–0.5)
HGB: 5.4 g/dL — CL (ref 11.6–15.9)
LYMPH%: 22.6 % (ref 14.0–48.0)
MCH: 16.8 pg — ABNORMAL LOW (ref 26.0–34.0)
MCHC: 25.1 g/dL — ABNORMAL LOW (ref 32.0–36.0)
MCV: 67 fL — ABNORMAL LOW (ref 81–101)
MONO%: 18.2 % — ABNORMAL HIGH (ref 0.0–13.0)
NEUT#: 1.8 10*3/uL (ref 1.5–6.5)
NEUT%: 55.5 % (ref 39.6–80.0)
RBC: 3.22 10*6/uL — ABNORMAL LOW (ref 3.70–5.32)

## 2013-01-30 NOTE — Patient Instructions (Signed)
Peripherally Inserted Central Catheter (PICC) Home Guide A peripherally inserted central catheter (PICC) is a long, thin, flexible tube that is inserted into a vein in the upper arm. It is a form of intravenous (IV) access. It is considered to be a "central" line because the tip of the PICC ends in a large vein in your chest. This large vein is called the superior vena cava (SVC). The PICC tip ends in the SVC because there is a lot of blood flow in the SVC. This allows medicines and IV fluids to be quickly distributed throughout the body. The PICC is inserted using a sterile technique by a specially trained nurse or physician. After the PICC is inserted, a chest X-ray is done to be sure it is in the correct place.  A PICC may be placed for different reasons, such as:  To give medicines and liquid nutrition that can only be given through a central line. Examples are:  Certain antibiotic treatments.  Chemotherapy.  Total parenteral nutrition (TPN).  To take frequent blood samples.  To give IV fluids and blood products.  If there is difficulty placing a peripheral intravenous (PIV) catheter. If taken care of properly, a PICC can remain in place for several months. A PICC can also allow patients to go home early. Medicine and PICC care can be managed at home by a family member or home healthcare team. RISKS AND COMPLICATIONS Possible problems with a PICC can occasionally occur. This may include:  A clot (thrombus) forming in or at the tip of the PICC. This can cause the PICC to become clogged. A "clot-busting" medicine called tissue plasminogen activator (tPA) can be inserted into the PICC to help break up the clot.  Inflammation of the vein (phlebitis) in which the PICC is placed. Signs of inflammation may include redness, pain at the insertion site, red streaks, or being able to feel a "cord" in the vein where the PICC is located.  Infection in the PICC or at the insertion site. Signs of  infection may include fever, chills, redness, swelling, or pus drainage from the PICC insertion site.  PICC movement (malposition). The PICC tip may migrate from its original position due to excessive physical activity, forceful coughing, sneezing, or vomiting.  A break or cut in the PICC. It is important to not use scissors near the PICC.  Nerve or tendon irritation or injury during PICC insertion. HOME CARE INSTRUCTIONS Activity  You may bend your arm and move it freely. If your PICC is near or at the bend of your elbow, avoid activity with repeated motion at the elbow.  Avoid lifting heavy objects as instructed by your caregiver.  Avoid using a crutch with the arm on the same side as your PICC. You may need to use a walker. PICC Dressing  Keep your PICC bandage (dressing) clean and dry to prevent infection.  Ask your caregiver when you may shower. Ask your caregiver to teach you how to wrap the PICC when you do take a shower.  Do not bathe, swim, or use hot tubs when you have a PICC.  Change the PICC dressing as instructed by your caregiver.  Change your PICC dressing if it becomes loose or wet. General PICC Care  Check the PICC insertion site daily for leakage, redness, swelling, or pain.  Flush the PICC as directed by your caregiver. Let your caregiver know right away if the PICC is difficult to flush or does not flush. Do not use force   to flush the PICC.  Do not use a syringe that is less than 10 mLs to flush the PICC.  Never pull or tug on the PICC.  Avoid blood pressure checks on the arm with the PICC.  Keep your PICC identification card with you at all times.  Do not take the PICC out yourself. Only a trained clinical professional should remove the PICC. SEEK IMMEDIATE MEDICAL CARE IF:  Your PICC is accidently pulled all the way out. If this happens, cover the insertion site with a bandage or gauze dressing. Do not throw the PICC away. Your caregiver will need to  inspect it.  Your PICC was tugged or pulled and has partially come out. Do not  push the PICC back in.  There is any type of drainage, redness, or swelling where the PICC enters the skin.  You cannot flush the PICC, it is difficult to flush, or the PICC leaks around the insertion site when it is flushed.  You hear a "flushing" sound when the PICC is flushed.  You have pain, discomfort, or numbness in your arm, shoulder, or jaw on the same side as the PICC .  You feel your heart "racing" or skipping beats.  You notice a hole or tear in the PICC.  You develop chills or a fever. MAKE SURE YOU:   Understand these instructions.  Will watch your condition.  Will get help right away if you are not doing well or get worse. Document Released: 09/19/2002 Document Revised: 06/07/2011 Document Reviewed: 07/20/2010 Exit[employer] Patient Information 2014 [employer].  

## 2013-01-30 NOTE — Progress Notes (Signed)
[name] presented for PICC line flush. Proper placement of PICC confirmed by CXR. PICC line located in the left antecubital. Clean, Dry and Intact Good blood return present. PICC line flushed with 20ml NS and 250U/2.66ml  Heparin per protocol. Procedure without incident. Patient tolerated procedure well.

## 2013-01-30 NOTE — Progress Notes (Signed)
This office note has been dictated.

## 2013-01-30 NOTE — Progress Notes (Signed)
Quick Note:  Sent letter in mail (re: EEG results). Since not able to reach pt. ______

## 2013-01-31 NOTE — Progress Notes (Signed)
CC:   [name], M.D.  DIAGNOSIS:  Polycythemia vera-hyperviscosity variant.  CURRENT THERAPY:  Therapeutic phlebotomy to maintain hemoglobin below 6.  INTERIM HISTORY:  [name] comes in for followup.  Again, she has been incredibly busy over the past 2 months.  She was hospitalized for cholecystectomy.  This was a very tough surgery for her.  I think the gallbladder ruptured and gallstones went throughout her abdomen.  Dr. Lindie Spruce did a great job in retrieving all of these and preventing a serious abdominal infection.  She, I think, was hospitalized for a few weeks with this.  She was given IV antibiotics.  I think, she even had a blood transfusion because her hemoglobin went down so low.  At the time of this current visit, she had no neurological issues with this hospitalization.  She did undergo a carotid endarterectomy back in August.  She is getting stronger.  She has a PICC line in now.  I think this is some that will really help her out long term, as she often needs to be phlebotomized.  She has poor venous access.  Overall, she is doing well from my point of view.  Her hemoglobin actually was 6.4 back on October 22.  We did go ahead and phlebotomize her at that time.  Her iron studies have always shown that she is profoundly iron deficient.  She has had no kidney issues.  She has had no problems with leg swelling.  She does have fibromyalgia.  She is on pain medications for this.  She did well with this in the hospital, which is a blessing.  She had no cardiac issues while she was hospitalized.  Dr. Patty Sermons follows her for this while hospitalized.  PHYSICAL EXAMINATION:  General:  This is a well-developed, well- nourished white female in no obvious distress.  Vital signs: Temperature of 97.8, pulse 66, respiratory rate 16, blood pressure 104/65.  Weight is 150 pounds.  Head and Neck:  Normocephalic, atraumatic skull.  There are no ocular or oral lesions.   No palpable cervical or supraclavicular lymph nodes.  Conjunctivae are somewhat pale.  Lungs:  Clear bilaterally.  Cardiac:  Regular rate and rhythm with a normal S1, S2.  There are no murmurs, rubs, or bruits.  Abdomen: Soft.  She has laparotomy and laparoscopy scars.  There is no fluid wave.  There is no guarding or rebound tenderness.  She has no palpable hepatosplenomegaly.  Extremities:  No clubbing, cyanosis, or edema. There may be some trace edema in her legs.  She has good strength.  She has good range of motion of her joints.  Skin:  Slightly pale. Neurological:  No focal neurological deficits.  LABORATORY STUDIES:  White cell count 3.2, hemoglobin 5.4, hematocrit 21.5, platelet count 328.  MCV is 67.  IMPRESSION:  [name] is a very nice 67 year old white female with polycythemia vera.  Again, she is a hyperviscosity variant.  Of note, she is on Plavix right now.  She has done well with the Plavix with respect to neurological issues.  We will have her come back weekly for the PICC line dressing change. She has to come back every 2 weeks for CBC.  I will plan to get her back and see me in another month or so.  I forgot to mention that she is also on aspirin.    ______________________________ Josph Macho, M.D. PRE/MEDQ  D:  01/30/2013  T:  01/31/2013  Job:  [id number]

## 2013-02-01 ENCOUNTER — Other Ambulatory Visit: Payer: Self-pay

## 2013-02-02 ENCOUNTER — Other Ambulatory Visit: Payer: Self-pay | Admitting: Cardiology

## 2013-02-02 ENCOUNTER — Telehealth: Payer: Self-pay | Admitting: *Deleted

## 2013-02-02 ENCOUNTER — Other Ambulatory Visit: Payer: Self-pay | Admitting: Hematology & Oncology

## 2013-02-02 MED ORDER — HYDROCHLOROTHIAZIDE 12.5 MG PO CAPS: 12.5000 mg | ORAL_CAPSULE | Freq: Every day | ORAL | Status: DC | PRN

## 2013-02-02 NOTE — Telephone Encounter (Signed)
Patient called requesting a refill on temazepam. I told her that Dr Patty Sermons and Juliette Alcide was not here today nor was Selena Batten, but I would speak with a triage nurse to see about getting this filled for her as she stated that she would not get any sleep without it. She agreed with plan and said that if I could just give her three to get her through until Monday that would be great. I was told that if Dr Patty Sermons normally fills this for her that I should be ok to give her three. In the meantime the pharmacy called so I gave them a verbal order for three. Will route to Addison Lank, RN patient care advocate for full refill when she returns to the office.

## 2013-02-05 ENCOUNTER — Other Ambulatory Visit: Payer: Self-pay | Admitting: Cardiology

## 2013-02-05 ENCOUNTER — Telehealth: Payer: Self-pay

## 2013-02-05 NOTE — Telephone Encounter (Signed)
Patient has called several times requesting refills on her xanax and restoril, it has been too early for her refills, will reinforce to take as directed. Phone in script.

## 2013-02-05 NOTE — Telephone Encounter (Signed)
Patient states it isn't too early for her restoril, she is anxious and upset, I informed her I have called in medication and for her to take exactly as prescribed.

## 2013-02-05 NOTE — Telephone Encounter (Signed)
patient called waning refill on restiol. talked to kim about this a note had already sent to kim

## 2013-02-07 ENCOUNTER — Ambulatory Visit (INDEPENDENT_AMBULATORY_CARE_PROVIDER_SITE_OTHER): Payer: Medicare Other | Admitting: Cardiology

## 2013-02-07 ENCOUNTER — Encounter: Payer: Self-pay | Admitting: Cardiology

## 2013-02-07 ENCOUNTER — Ambulatory Visit (HOSPITAL_BASED_OUTPATIENT_CLINIC_OR_DEPARTMENT_OTHER): Payer: Medicare Other

## 2013-02-07 VITALS — BP 134/63 | HR 73 | Ht 63.0 in | Wt 149.0 lb

## 2013-02-07 VITALS — BP 115/60 | HR 70 | Temp 97.1°F | Resp 18

## 2013-02-07 DIAGNOSIS — D45 Polycythemia vera: Secondary | ICD-10-CM

## 2013-02-07 DIAGNOSIS — I119 Hypertensive heart disease without heart failure: Secondary | ICD-10-CM

## 2013-02-07 DIAGNOSIS — E039 Hypothyroidism, unspecified: Secondary | ICD-10-CM

## 2013-02-07 DIAGNOSIS — D649 Anemia, unspecified: Secondary | ICD-10-CM

## 2013-02-07 DIAGNOSIS — G40909 Epilepsy, unspecified, not intractable, without status epilepticus: Secondary | ICD-10-CM

## 2013-02-07 DIAGNOSIS — I6529 Occlusion and stenosis of unspecified carotid artery: Secondary | ICD-10-CM

## 2013-02-07 DIAGNOSIS — I63239 Cerebral infarction due to unspecified occlusion or stenosis of unspecified carotid arteries: Secondary | ICD-10-CM

## 2013-02-07 DIAGNOSIS — Z452 Encounter for adjustment and management of vascular access device: Secondary | ICD-10-CM

## 2013-02-07 DIAGNOSIS — R569 Unspecified convulsions: Secondary | ICD-10-CM

## 2013-02-07 MED ORDER — CYCLOBENZAPRINE HCL 10 MG PO TABS: 10.0000 mg | ORAL_TABLET | Freq: Every day | ORAL | Status: DC

## 2013-02-07 MED ORDER — ALPRAZOLAM 0.5 MG PO TABS: 0.5000 mg | ORAL_TABLET | Freq: Every day | ORAL | Status: DC

## 2013-02-07 MED ORDER — SODIUM CHLORIDE 0.9 % IJ SOLN
10.0000 mL | Freq: Once | INTRAMUSCULAR | Status: AC
  Administered 2013-02-07: 10 mL via INTRAVENOUS
  Filled 2013-02-07: qty 10

## 2013-02-07 MED ORDER — HEPARIN SOD (PORK) LOCK FLUSH 100 UNIT/ML IV SOLN
500.0000 [IU] | Freq: Once | INTRAVENOUS | Status: AC
  Administered 2013-02-07: 500 [IU] via INTRAVENOUS
  Filled 2013-02-07: qty 5

## 2013-02-07 NOTE — Patient Instructions (Signed)
Your physician recommends that you continue on your current medications as directed. Please refer to the Current Medication list given to you today.   Your physician recommends that you schedule a follow-up appointment in: 3 months with Dr. Patty Sermons. Your physician recommends that you return for lab work in: 3 months at your follow up with Dr. Patty Sermons (HFP/BMET/TSH/Free T4)

## 2013-02-07 NOTE — Assessment & Plan Note (Signed)
She is status post left carotid endarterectomy.  She also has moderately severe lesion on the right and is scheduled to have a followup carotid Doppler soon.

## 2013-02-07 NOTE — Assessment & Plan Note (Signed)
Blood pressure was remaining stable on current therapy.  She is not having any symptoms of CHF.  No dizziness or syncope.  No palpitations or tachycardia.

## 2013-02-07 NOTE — Assessment & Plan Note (Signed)
The patient is clinically euthyroid on thyroid replacement.  We will plan to recheck her blood work at her next visit

## 2013-02-07 NOTE — Assessment & Plan Note (Signed)
The patient has had no further seizures since starting Keppra.  She does not like taking anticonvulsant therapy because of the restrictions on her driving.  She will discuss this further with her neurologist.

## 2013-02-07 NOTE — Progress Notes (Signed)
[name] Date of Birth:  1945/12/31 [address] [place], [place]  [address] [phone]         Fax   [phone]  History of Present Illness: This pleasant 67 year old Caucasian female is seen for a three-month followup office visit.  She has a history of polycythemia vera and a history of hypothyroidism.  She has a history of aortic valve sclerosis and essential hypertension. She has atypical chest pain and does not have any history of ischemic heart disease.  She has a past history of PVCs. The patient is status post cholecystectomy.  Dr. Lindie Spruce is her surgeon.  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  She has had a recent left carotid endarterectomy.  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  She does not have any history of ischemic heart disease.  She had an echocardiogram 10/04/12 showing ejection fraction 65-70% with grade 1 diastolic dysfunction.  There was elevated pulmonary artery pressure of 53 mm mercury.  Current Outpatient Prescriptions  Medication Sig Dispense Refill  . albuterol-ipratropium (COMBIVENT) 18-103 MCG/ACT inhaler Inhale 2 puffs into the lungs every 6 (six) hours as needed for wheezing.      Marland Kitchen ALPRAZolam (XANAX) 0.5 MG tablet Take 1 tablet (0.5 mg total) by mouth at bedtime.  30 tablet  5  . aspirin 325 MG tablet Take 325 mg by mouth daily after breakfast.       . bisoprolol (ZEBETA) 10 MG tablet Take 10 mg by mouth daily after breakfast.       . [place] 450 MG CAPS Take 900 mg by mouth at bedtime.      . cholecalciferol (VITAMIN D) 1000 UNITS tablet Take 3,000 Units by mouth daily after breakfast.       . clopidogrel (PLAVIX) 75 MG tablet Take 75 mg by mouth daily after breakfast.       . clopidogrel (PLAVIX) 75 MG tablet TAKE 1 TABLET EVERY DAY  30 tablet  3  . cyanocobalamin (,VITAMIN B-12,) 1000 MCG/ML injection Inject  1,000 mcg into the muscle as needed (for energy).      . cyclobenzaprine (FLEXERIL) 10 MG tablet Take 1 tablet (10 mg total) by mouth at bedtime. Patient takes 10 mg at bedtime each day.  Patient make take 10 mg  twice a day as needed for spasms.  30 tablet  5  . diphenhydrAMINE (BENADRYL) 25 MG tablet Take 50 mg by mouth See admin instructions. Patient takes 50 mg at bedtime each day.  Patient may take 50 mg three times a day as needed.      . docusate sodium (COLACE) 100 MG capsule Take 100 mg by mouth 3 (three) times daily with meals.       Marland Kitchen estrogens, conjugated, (PREMARIN) 0.625 MG tablet Take 0.625 mg by mouth daily after breakfast.       . famotidine (PEPCID) 20 MG tablet Take 20 mg by mouth 2 (two) times daily before a meal.      . fexofenadine (ALLEGRA) 180 MG tablet Take 180 mg by mouth daily after breakfast.       . fluconazole (DIFLUCAN) 100 MG tablet Take 100 mg by mouth daily.      . fluoxymesterone (ANDROXY) 10 MG tablet Take 2.5 mg by mouth daily as needed (before sexual activity).       . furosemide (  LASIX) 40 MG tablet Take 80 mg by mouth daily after breakfast.       . Garlic Oil 1000 MG CAPS Take 1,000 mg by mouth daily after breakfast.      . hydrochlorothiazide (MICROZIDE) 12.5 MG capsule Take 1 capsule (12.5 mg total) by mouth daily as needed. If Systolic is GREATER than 130  30 capsule  1  . levETIRAcetam (KEPPRA) 500 MG tablet Take 500 mg by mouth 2 (two) times daily.      Marland Kitchen lidocaine-prilocaine (EMLA) cream Apply 1 application topically as needed (to numb the phlebotomy site).      Marland Kitchen lisinopril (PRINIVIL,ZESTRIL) 10 MG tablet Take 10 mg by mouth 2 (two) times daily as needed. If Systolic is GREATER than 130      . magnesium oxide (MAG-OX) 400 MG tablet Take 1,200 mg by mouth daily.      . mineral oil liquid Take 60 mLs by mouth at bedtime. With Juice      . nitroGLYCERIN (NITROSTAT) 0.4 MG SL tablet Place 0.4 mg under the tongue every 5 (five) minutes as needed for chest  pain.      Marland Kitchen ondansetron (ZOFRAN) 8 MG tablet Take 1 tablet (8 mg total) by mouth every 8 (eight) hours as needed for nausea.  20 tablet  3  . OxyCODONE (OXYCONTIN) 10 mg T12A 12 hr tablet Take 1 tablet (10 mg total) by mouth every 12 (twelve) hours. BRAND NAME MEDICALLY NECESSARY  60 tablet  0  . oxyCODONE (ROXICODONE) 15 MG immediate release tablet Take 1 tablet (15 mg total) by mouth every 8 (eight) hours as needed (for breakthrough pain). BRAND NAME MEDICALLY NECESSARY.  90 tablet  0  . OXYCONTIN 20 MG T12A 12 hr tablet Take 1 tablet (20 mg total) by mouth See admin instructions. Take 1 tablet at 12noon, 4pm, and 4am. BRAND NAME MEDICALLY NECESSARY!  60 tablet  0  . pantoprazole (PROTONIX) 40 MG tablet Take 1 tablet (40 mg total) by mouth daily.  30 tablet  0  . potassium chloride (MICRO-K) 10 MEQ CR capsule Take 80 mEq by mouth daily after breakfast.      . promethazine (PHENERGAN) 25 MG tablet Take 25 mg by mouth every 6 (six) hours as needed for nausea.      . Sennosides (SENNA LAX PO) Take 1 tablet by mouth 3 (three) times daily with meals.       . temazepam (RESTORIL) 30 MG capsule Take 1 capsule as needed for sleep take exactly as directed  3 capsule  5  . thyroid (ARMOUR) 60 MG tablet Take 150 mg by mouth daily after breakfast. Levothyroxine is not effective. Takes only armour thyroid.      Marland Kitchen triamcinolone cream (KENALOG) 0.1 % Apply 1 application topically 2 (two) times daily as needed (to skin).      . Valerian 100 MG CAPS Take 100 mg by mouth at bedtime as needed (for vitamin).       No current facility-administered medications for this visit.    Allergies  Allergen Reactions  . Codeine Nausea Only  . Synthroid [Levothyroxine Sodium] Other (See Comments)    Not effective. Causes excessive sleepiness. Takes armour thyroid  . Calcium Channel Blockers Palpitations  . Penicillins Rash    Patient Active Problem List   Diagnosis Date Noted  . Protein-calorie malnutrition, severe  12/29/2012  . Nausea and vomiting 12/28/2012  . Anemia 12/28/2012  . Dehydration 12/28/2012  . Hypotension 12/28/2012  . Status  post cholecystectomy 12/28/2012  . Occlusion and stenosis of carotid artery with cerebral infarction 12/21/2012  . Seizures 12/21/2012  . Postop check 12/19/2012  . Occlusion and stenosis of carotid artery without mention of cerebral infarction 10/26/2012  . Cholecystostomy tube dysfunction 10/24/2012  . 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 Acute cholecystitis 10/07/2012  . CVA (cerebral infarction) 10/05/2012  . Septic shock 10/02/2012  . Cholelithiases 09/30/2012  . Unspecified constipation 09/30/2012  . Bronchitis 03/08/2011  . Benign hypertensive heart disease without heart failure 09/28/2010  . Aortic valve sclerosis 09/28/2010  . Hypothyroidism 09/28/2010  . Polycythemia vera(238.4) 09/28/2010  . Postmenopausal state 09/28/2010  . Fibromyalgia 09/28/2010    History  Smoking status  . Never Smoker   Smokeless tobacco  . Never Used    History  Alcohol Use No    Family History  Problem Relation Age of Onset  . Heart attack Father   . Coronary artery disease Mother     had aortic valve replacement    Review of Systems: Constitutional: no fever chills diaphoresis or fatigue or change in weight.  Head and neck: no hearing loss, no epistaxis, no photophobia or visual disturbance. Respiratory: No cough, shortness of breath or wheezing. Cardiovascular: No chest pain peripheral edema, palpitations. Gastrointestinal: No abdominal distention, no abdominal pain, no change in bowel habits hematochezia or melena. Genitourinary: No dysuria, no frequency, no urgency, no nocturia. Musculoskeletal:No arthralgias, no back pain, no gait disturbance or myalgias. Neurological: No dizziness, no headaches, no numbness, no seizures, no syncope, no weakness, no tremors. Hematologic: No lymphadenopathy, no easy bruising. Psychiatric: No confusion,  no hallucinations, no sleep disturbance.    Physical Exam: Filed Vitals:   02/07/13 1414  BP: 134/63  Pulse: 73   the general appearance reveals a well-developed well-nourished woman in no distress.  She has marked chronic alopecia.  The left carotid endarterectomy scar is healing well.The head and neck exam reveals pupils equal and reactive.  Extraocular movements are full.  There is no scleral icterus.  The mouth and pharynx are normal.  The neck is supple.  The carotids reveal no bruits.  The jugular venous pressure is normal.  The  thyroid is not enlarged.  There is no lymphadenopathy.  The chest is clear to percussion and auscultation.  There are no rales or rhonchi.  Expansion of the chest is symmetrical.  The precordium is quiet.  The first heart sound is normal.  The second heart sound is physiologically split.  There is no murmur gallop rub or click.  There is no abnormal lift or heave.  The abdomen is soft and nontender.  There is a right cholecystostomy tube.  The bowel sounds are normal.  The liver and spleen are not enlarged.  There are no abdominal masses.  There are no abdominal bruits.  Extremities reveal good pedal pulses.  There is no phlebitis or edema.  There is no cyanosis or clubbing.  Strength is normal and symmetrical in all extremities.  There is no lateralizing weakness.  There are no sensory deficits.  The skin is warm and dry.  There is no rash.     Assessment / Plan: From a cardiac standpoint she is doing well.  Continue same medication.  Recheck here in 3 months for office visit and EKG. and hepatic function panel basal metabolic panel TSH and free T4. We refilled her generic Xanax at bedtime and her generic Flexeril at bedtime today

## 2013-02-07 NOTE — Patient Instructions (Signed)
Peripherally Inserted Central Catheter (PICC) Home Guide A peripherally inserted central catheter (PICC) is a long, thin, flexible tube that is inserted into a vein in the upper arm. It is a form of intravenous (IV) access. It is considered to be a "central" line because the tip of the PICC ends in a large vein in your chest. This large vein is called the superior vena cava (SVC). The PICC tip ends in the SVC because there is a lot of blood flow in the SVC. This allows medicines and IV fluids to be quickly distributed throughout the body. The PICC is inserted using a sterile technique by a specially trained nurse or physician. After the PICC is inserted, a chest X-ray is done to be sure it is in the correct place.  A PICC may be placed for different reasons, such as:  To give medicines and liquid nutrition that can only be given through a central line. Examples are:  Certain antibiotic treatments.  Chemotherapy.  Total parenteral nutrition (TPN).  To take frequent blood samples.  To give IV fluids and blood products.  If there is difficulty placing a peripheral intravenous (PIV) catheter. If taken care of properly, a PICC can remain in place for several months. A PICC can also allow patients to go home early. Medicine and PICC care can be managed at home by a family member or home healthcare team. RISKS AND COMPLICATIONS Possible problems with a PICC can occasionally occur. This may include:  A clot (thrombus) forming in or at the tip of the PICC. This can cause the PICC to become clogged. A "clot-busting" medicine called tissue plasminogen activator (tPA) can be inserted into the PICC to help break up the clot.  Inflammation of the vein (phlebitis) in which the PICC is placed. Signs of inflammation may include redness, pain at the insertion site, red streaks, or being able to feel a "cord" in the vein where the PICC is located.  Infection in the PICC or at the insertion site. Signs of  infection may include fever, chills, redness, swelling, or pus drainage from the PICC insertion site.  PICC movement (malposition). The PICC tip may migrate from its original position due to excessive physical activity, forceful coughing, sneezing, or vomiting.  A break or cut in the PICC. It is important to not use scissors near the PICC.  Nerve or tendon irritation or injury during PICC insertion. HOME CARE INSTRUCTIONS Activity  You may bend your arm and move it freely. If your PICC is near or at the bend of your elbow, avoid activity with repeated motion at the elbow.  Avoid lifting heavy objects as instructed by your caregiver.  Avoid using a crutch with the arm on the same side as your PICC. You may need to use a walker. PICC Dressing  Keep your PICC bandage (dressing) clean and dry to prevent infection.  Ask your caregiver when you may shower. Ask your caregiver to teach you how to wrap the PICC when you do take a shower.  Do not bathe, swim, or use hot tubs when you have a PICC.  Change the PICC dressing as instructed by your caregiver.  Change your PICC dressing if it becomes loose or wet. General PICC Care  Check the PICC insertion site daily for leakage, redness, swelling, or pain.  Flush the PICC as directed by your caregiver. Let your caregiver know right away if the PICC is difficult to flush or does not flush. Do not use force   to flush the PICC.  Do not use a syringe that is less than 10 mLs to flush the PICC.  Never pull or tug on the PICC.  Avoid blood pressure checks on the arm with the PICC.  Keep your PICC identification card with you at all times.  Do not take the PICC out yourself. Only a trained clinical professional should remove the PICC. SEEK IMMEDIATE MEDICAL CARE IF:  Your PICC is accidently pulled all the way out. If this happens, cover the insertion site with a bandage or gauze dressing. Do not throw the PICC away. Your caregiver will need to  inspect it.  Your PICC was tugged or pulled and has partially come out. Do not  push the PICC back in.  There is any type of drainage, redness, or swelling where the PICC enters the skin.  You cannot flush the PICC, it is difficult to flush, or the PICC leaks around the insertion site when it is flushed.  You hear a "flushing" sound when the PICC is flushed.  You have pain, discomfort, or numbness in your arm, shoulder, or jaw on the same side as the PICC .  You feel your heart "racing" or skipping beats.  You notice a hole or tear in the PICC.  You develop chills or a fever. MAKE SURE YOU:   Understand these instructions.  Will watch your condition.  Will get help right away if you are not doing well or get worse. Document Released: 09/19/2002 Document Revised: 06/07/2011 Document Reviewed: 07/20/2010 Exit[employer] Patient Information 2014 [employer].  

## 2013-02-14 ENCOUNTER — Other Ambulatory Visit: Payer: Medicare Other | Admitting: Lab

## 2013-02-14 ENCOUNTER — Other Ambulatory Visit: Payer: Self-pay | Admitting: *Deleted

## 2013-02-14 ENCOUNTER — Other Ambulatory Visit (HOSPITAL_BASED_OUTPATIENT_CLINIC_OR_DEPARTMENT_OTHER): Payer: Medicare Other | Admitting: Lab

## 2013-02-14 ENCOUNTER — Ambulatory Visit (HOSPITAL_BASED_OUTPATIENT_CLINIC_OR_DEPARTMENT_OTHER): Payer: Medicare Other

## 2013-02-14 VITALS — BP 107/65 | HR 80 | Temp 97.4°F | Resp 20

## 2013-02-14 DIAGNOSIS — T148XXA Other injury of unspecified body region, initial encounter: Secondary | ICD-10-CM

## 2013-02-14 DIAGNOSIS — D45 Polycythemia vera: Secondary | ICD-10-CM

## 2013-02-14 DIAGNOSIS — M797 Fibromyalgia: Secondary | ICD-10-CM

## 2013-02-14 DIAGNOSIS — K810 Acute cholecystitis: Secondary | ICD-10-CM

## 2013-02-14 DIAGNOSIS — R1011 Right upper quadrant pain: Secondary | ICD-10-CM

## 2013-02-14 LAB — CBC WITH DIFFERENTIAL (CANCER CENTER ONLY)
EOS%: 7.1 % — ABNORMAL HIGH (ref 0.0–7.0)
Eosinophils Absolute: 0.3 10*3/uL (ref 0.0–0.5)
HCT: 22.9 % — ABNORMAL LOW (ref 34.8–46.6)
LYMPH%: 17.6 % (ref 14.0–48.0)
MCH: 15.7 pg — ABNORMAL LOW (ref 26.0–34.0)
MCHC: 24.5 g/dL — ABNORMAL LOW (ref 32.0–36.0)
MCV: 64 fL — ABNORMAL LOW (ref 81–101)
MONO%: 15.8 % — ABNORMAL HIGH (ref 0.0–13.0)
NEUT#: 2.6 10*3/uL (ref 1.5–6.5)
NEUT%: 59 % (ref 39.6–80.0)
Platelets: 207 10*3/uL (ref 145–400)
RDW: 23.5 % — ABNORMAL HIGH (ref 11.1–15.7)

## 2013-02-14 MED ORDER — OXYCODONE HCL 15 MG PO TABS: 15.0000 mg | ORAL_TABLET | Freq: Three times a day (TID) | ORAL | Status: DC | PRN

## 2013-02-14 MED ORDER — HEPARIN SOD (PORK) LOCK FLUSH 100 UNIT/ML IV SOLN
500.0000 [IU] | INTRAVENOUS | Status: DC | PRN
  Filled 2013-02-14: qty 5

## 2013-02-14 MED ORDER — SODIUM CHLORIDE 0.9 % IJ SOLN
10.0000 mL | INTRAMUSCULAR | Status: DC | PRN
  Filled 2013-02-14: qty 10

## 2013-02-14 MED ORDER — OXYCODONE HCL ER 10 MG PO T12A: 10.0000 mg | EXTENDED_RELEASE_TABLET | Freq: Two times a day (BID) | ORAL | Status: DC

## 2013-02-14 MED ORDER — SODIUM CHLORIDE 0.9 % IJ SOLN
10.0000 mL | INTRAMUSCULAR | Status: AC | PRN
  Administered 2013-02-14: 10 mL
  Filled 2013-02-14: qty 10

## 2013-02-14 MED ORDER — HEPARIN SOD (PORK) LOCK FLUSH 100 UNIT/ML IV SOLN
250.0000 [IU] | INTRAVENOUS | Status: AC | PRN
  Administered 2013-02-14: 13:00:00
  Filled 2013-02-14: qty 5

## 2013-02-14 MED ORDER — OXYCONTIN 20 MG PO T12A: 20.0000 mg | EXTENDED_RELEASE_TABLET | ORAL | Status: DC

## 2013-02-14 NOTE — Patient Instructions (Signed)
Peripherally Inserted Central Catheter (PICC) Home Guide A peripherally inserted central catheter (PICC) is a long, thin, flexible tube that is inserted into a vein in the upper arm. It is a form of intravenous (IV) access. It is considered to be a "central" line because the tip of the PICC ends in a large vein in your chest. This large vein is called the superior vena cava (SVC). The PICC tip ends in the SVC because there is a lot of blood flow in the SVC. This allows medicines and IV fluids to be quickly distributed throughout the body. The PICC is inserted using a sterile technique by a specially trained nurse or physician. After the PICC is inserted, a chest X-ray is done to be sure it is in the correct place.  A PICC may be placed for different reasons, such as:  To give medicines and liquid nutrition that can only be given through a central line. Examples are:  Certain antibiotic treatments.  Chemotherapy.  Total parenteral nutrition (TPN).  To take frequent blood samples.  To give IV fluids and blood products.  If there is difficulty placing a peripheral intravenous (PIV) catheter. If taken care of properly, a PICC can remain in place for several months. A PICC can also allow patients to go home early. Medicine and PICC care can be managed at home by a family member or home healthcare team. RISKS AND COMPLICATIONS Possible problems with a PICC can occasionally occur. This may include:  A clot (thrombus) forming in or at the tip of the PICC. This can cause the PICC to become clogged. A "clot-busting" medicine called tissue plasminogen activator (tPA) can be inserted into the PICC to help break up the clot.  Inflammation of the vein (phlebitis) in which the PICC is placed. Signs of inflammation may include redness, pain at the insertion site, red streaks, or being able to feel a "cord" in the vein where the PICC is located.  Infection in the PICC or at the insertion site. Signs of  infection may include fever, chills, redness, swelling, or pus drainage from the PICC insertion site.  PICC movement (malposition). The PICC tip may migrate from its original position due to excessive physical activity, forceful coughing, sneezing, or vomiting.  A break or cut in the PICC. It is important to not use scissors near the PICC.  Nerve or tendon irritation or injury during PICC insertion. HOME CARE INSTRUCTIONS Activity  You may bend your arm and move it freely. If your PICC is near or at the bend of your elbow, avoid activity with repeated motion at the elbow.  Avoid lifting heavy objects as instructed by your caregiver.  Avoid using a crutch with the arm on the same side as your PICC. You may need to use a walker. PICC Dressing  Keep your PICC bandage (dressing) clean and dry to prevent infection.  Ask your caregiver when you may shower. Ask your caregiver to teach you how to wrap the PICC when you do take a shower.  Do not bathe, swim, or use hot tubs when you have a PICC.  Change the PICC dressing as instructed by your caregiver.  Change your PICC dressing if it becomes loose or wet. General PICC Care  Check the PICC insertion site daily for leakage, redness, swelling, or pain.  Flush the PICC as directed by your caregiver. Let your caregiver know right away if the PICC is difficult to flush or does not flush. Do not use force   to flush the PICC.  Do not use a syringe that is less than 10 mLs to flush the PICC.  Never pull or tug on the PICC.  Avoid blood pressure checks on the arm with the PICC.  Keep your PICC identification card with you at all times.  Do not take the PICC out yourself. Only a trained clinical professional should remove the PICC. SEEK IMMEDIATE MEDICAL CARE IF:  Your PICC is accidently pulled all the way out. If this happens, cover the insertion site with a bandage or gauze dressing. Do not throw the PICC away. Your caregiver will need to  inspect it.  Your PICC was tugged or pulled and has partially come out. Do not  push the PICC back in.  There is any type of drainage, redness, or swelling where the PICC enters the skin.  You cannot flush the PICC, it is difficult to flush, or the PICC leaks around the insertion site when it is flushed.  You hear a "flushing" sound when the PICC is flushed.  You have pain, discomfort, or numbness in your arm, shoulder, or jaw on the same side as the PICC .  You feel your heart "racing" or skipping beats.  You notice a hole or tear in the PICC.  You develop chills or a fever. MAKE SURE YOU:   Understand these instructions.  Will watch your condition.  Will get help right away if you are not doing well or get worse. Document Released: 09/19/2002 Document Revised: 06/07/2011 Document Reviewed: 07/20/2010 Exit[employer] Patient Information 2014 [employer].  

## 2013-02-14 NOTE — Telephone Encounter (Signed)
Pt called yesterday asking to have her pain medications refilled with her lab visit today. It was last dated to be filled on 01/17/13. Will route to Dr [name] for approval and place at the front desk when ready.

## 2013-02-14 NOTE — Progress Notes (Signed)
Copy of lab work given to patient.

## 2013-02-21 ENCOUNTER — Ambulatory Visit (HOSPITAL_BASED_OUTPATIENT_CLINIC_OR_DEPARTMENT_OTHER): Payer: Medicare Other

## 2013-02-21 VITALS — BP 116/63 | HR 76 | Temp 97.0°F | Resp 18

## 2013-02-21 DIAGNOSIS — Z452 Encounter for adjustment and management of vascular access device: Secondary | ICD-10-CM

## 2013-02-21 DIAGNOSIS — D45 Polycythemia vera: Secondary | ICD-10-CM

## 2013-02-21 DIAGNOSIS — D751 Secondary polycythemia: Secondary | ICD-10-CM

## 2013-02-21 MED ORDER — HEPARIN SOD (PORK) LOCK FLUSH 100 UNIT/ML IV SOLN
500.0000 [IU] | Freq: Once | INTRAVENOUS | Status: AC
  Administered 2013-02-21: 500 [IU] via INTRAVENOUS
  Filled 2013-02-21: qty 5

## 2013-02-21 MED ORDER — SODIUM CHLORIDE 0.9 % IJ SOLN
10.0000 mL | INTRAMUSCULAR | Status: DC | PRN
  Administered 2013-02-21: 10 mL via INTRAVENOUS
  Filled 2013-02-21: qty 10

## 2013-02-21 NOTE — Patient Instructions (Signed)
Patient came in for picc line flush and dressing change. Site look good. No redness and good blood return.[name]

## 2013-02-26 ENCOUNTER — Other Ambulatory Visit: Payer: Self-pay | Admitting: Dermatology

## 2013-02-27 ENCOUNTER — Ambulatory Visit: Payer: Medicare Other

## 2013-02-27 ENCOUNTER — Other Ambulatory Visit (HOSPITAL_BASED_OUTPATIENT_CLINIC_OR_DEPARTMENT_OTHER): Payer: Medicare Other | Admitting: Lab

## 2013-02-27 ENCOUNTER — Ambulatory Visit (HOSPITAL_BASED_OUTPATIENT_CLINIC_OR_DEPARTMENT_OTHER): Payer: Medicare Other | Admitting: Hematology & Oncology

## 2013-02-27 VITALS — BP 133/40 | HR 82 | Temp 98.0°F | Resp 14 | Ht 63.0 in | Wt 155.0 lb

## 2013-02-27 DIAGNOSIS — Z8673 Personal history of transient ischemic attack (TIA), and cerebral infarction without residual deficits: Secondary | ICD-10-CM

## 2013-02-27 DIAGNOSIS — D45 Polycythemia vera: Secondary | ICD-10-CM

## 2013-02-27 LAB — CBC WITH DIFFERENTIAL (CANCER CENTER ONLY)
BASO%: 0.3 % (ref 0.0–2.0)
LYMPH%: 19.2 % (ref 14.0–48.0)
MCV: 65 fL — ABNORMAL LOW (ref 81–101)
MONO#: 0.5 10*3/uL (ref 0.1–0.9)
MONO%: 13.2 % — ABNORMAL HIGH (ref 0.0–13.0)
NEUT#: 2.3 10*3/uL (ref 1.5–6.5)
Platelets: 212 10*3/uL (ref 145–400)
RBC: 3.75 10*6/uL (ref 3.70–5.32)
RDW: 23.1 % — ABNORMAL HIGH (ref 11.1–15.7)
WBC: 3.9 10*3/uL (ref 3.9–10.0)

## 2013-02-27 LAB — URINALYSIS, MICROSCOPIC (CHCC SATELLITE)
Bilirubin (Urine): NEGATIVE
Blood: NEGATIVE
Glucose: NEGATIVE mg/dL
Ketones: NEGATIVE mg/dL
Protein: NEGATIVE mg/dL
Urobilinogen, UR: 0.2 mg/dL (ref 0.2–1)

## 2013-02-27 LAB — FERRITIN CHCC: Ferritin: <4 ng/ml (ref 9–269)

## 2013-02-27 NOTE — Progress Notes (Signed)
[name] presented for PICC line removal per Dr. [name] order.  VS stable before procedure.  Catheter slowly removed without incident. Vaseline gauze dressing covered with 4x4 dressing securely wrapped with Coban dressing.   Patient lying supine.  Patient tol well.  Denies complaints.  Patient discharged.

## 2013-02-27 NOTE — Patient Instructions (Signed)
Peripherally Inserted Central Catheter (PICC) Removal and Care After A peripherally inserted catheter (PICC) is removed when it is no longer needed, when it is clotted, or when it may be infected.  PROCEDURE  The removal of a PICC is usually painless. Removing the tape that holds the PICC in place may be the most discomfort you have.  A physicians order needs to be obtained to have the PICC removed.  A PICC can be removed in the hospital or in an outpatient setting.  Never remove or take out the PICC yourself. Only a trained clinical professional, such as a PICC nurse, should remove the PICC.  If a PICC is suspected to be infected, the PICC tip is sent to the lab for culture. HOME CARE INSTRUCTIONS  When the PICC is out, pressure is applied at the insertion site to prevent bleeding. An antibiotic ointment may be applied to the insertion site. A dry, sterile gauze is then taped over the insertion site. This dressing should stay on for 24 hours.  After the 24 hours is up, the dressing may be removed. The PICC insertion site is very small. A small scab may develop over the insertion site. It is okay to wash the site gently with soap and water. Be careful to not remove or pick the scab off. After washing, gently pat the site dry. You do not need to put another dressing over the insertion site after you wash it.  Avoid heavy, strenuous physical activity for 24 hours after the PICC is removed. This includes things like:  Weight lifting.  Strenuous yard work.  Any physical activity with repetitive arm movement. SEEK MEDICAL CARE IF:  Call or see your caregiver as soon as possible if you develop the following conditions in the arm in which the PICC was inserted:  Swelling or puffiness.  Increasing tenderness or pain. SEEK IMMEDIATE MEDICAL CARE IF:  You develop any of the following conditions in the arm that had the PICC:  Numbness or tingling in your fingers, hand, or arm.  You arm has  a bluish color and it is cold to the touch.  Redness around the insertion site or a red-streak that goes up your arm.  Any type of drainage from the PICC insertion site. This includes drainage such as:  Bleeding from the insertion site. (If this happens, apply firm, direct pressure to the PICC insertion site with a clean towel.)  Drainage that is yellow or tan in color.  You have an oral temperature above 102 F (38.9 C), not controlled by medicine. Document Released: 09/02/2009 Document Revised: 06/07/2011 Document Reviewed: 09/02/2009 Exit[employer] Patient Information 2014 [employer].  

## 2013-03-02 ENCOUNTER — Other Ambulatory Visit: Payer: Self-pay | Admitting: Cardiology

## 2013-03-02 DIAGNOSIS — N390 Urinary tract infection, site not specified: Secondary | ICD-10-CM

## 2013-03-02 NOTE — Progress Notes (Signed)
This office note has been dictated.

## 2013-03-05 ENCOUNTER — Telehealth: Payer: Self-pay | Admitting: *Deleted

## 2013-03-05 ENCOUNTER — Ambulatory Visit (INDEPENDENT_AMBULATORY_CARE_PROVIDER_SITE_OTHER): Payer: Medicare Other | Admitting: Gastroenterology

## 2013-03-05 ENCOUNTER — Encounter: Payer: Self-pay | Admitting: Gastroenterology

## 2013-03-05 ENCOUNTER — Other Ambulatory Visit: Payer: Medicare Other

## 2013-03-05 VITALS — BP 110/60 | HR 80 | Ht 63.0 in | Wt 150.4 lb

## 2013-03-05 DIAGNOSIS — D649 Anemia, unspecified: Secondary | ICD-10-CM

## 2013-03-05 DIAGNOSIS — R112 Nausea with vomiting, unspecified: Secondary | ICD-10-CM

## 2013-03-05 MED ORDER — NA SULFATE-K SULFATE-MG SULF 17.5-3.13-1.6 GM/177ML PO SOLN: 1.0000 | Freq: Once | ORAL | Status: DC

## 2013-03-05 NOTE — Assessment & Plan Note (Signed)
Patient tested Hemoccult positive and had a drop in hemoglobin although clinically was not actively bleeding while in the hospital.  Grade a erosive esophagitis was noted at endoscopy.  Lower GI bleeding source should be ruled out.  Recommendations #1 colonoscopy; I will check with the patient's hematologist to determine whether Plavix can be held  The risk of holding anticoagulation therapy or antiplatelet medications was discussed including the increased risk for thromboembolic disease that may include DVT, pulmonary emboli and stroke. The patient understands this risk and is willing to proceed with temporally holding the medication provided that this is approved by her PCP or cardiologist.

## 2013-03-05 NOTE — Telephone Encounter (Signed)
  03/05/2013   RE: [name] DOB: 07/12/1945 MRN: [id number]   Dear Dr Porfirio Mylar    We have scheduled the above patient for an endoscopic procedure. Our records show that she is on anticoagulation therapy.   Please advise as to how long the patient may come off her therapy of Plavix prior to the procedure, which is scheduled for 04/19/2013.  Please fax back/ or route the completed form to Shayonna Ocampo at [phone].   Sincerely,    Merri Ray

## 2013-03-05 NOTE — Telephone Encounter (Signed)
The patient is not on Plavix for her heart.  She is on Plavix for her carotid artery disease and previous strokes.  I would suggest checking with her neurologist or vascular surgeon about stopping her Plavix for the endoscopic procedure.

## 2013-03-05 NOTE — Progress Notes (Signed)
History of Present Illness: 67 year old white female with multiple medical problems including polycythemia vera, chronic pain syndrome, CVA, seizures, seen in the hospital in October, 2014 for intractable nausea and vomiting following cholecystectomy.  She was noted to be anemic with a hemoglobin of 4.1 compare with her baseline of 5-6.  She had 2 days of black stools.  Upper endoscopy demonstrated grade a erosive esophagitis.  She was Hemoccult-positive.  Colonoscopy was recommended but deferred until her medical problems stabilized.  Since discharge she has continued to feel stronger.  She has no specific GI complaints.  She rarely has seen a spot of blood on the toilet tissue.  Last colonoscopy was over 10 years ago.  Hemoglobin last week was 5.7.    Past Medical History  Diagnosis Date  . Fibromyalgia     chronic pain syndrome  . Postmenopausal state     on hormone replacement therapy  . Aortic valve sclerosis     by echocardiogram 12/04/2009  . Hyperlipidemia     "not since carotid OR" (12/28/2012)  . 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 GERD (gastroesophageal reflux disease)   . PAC (premature atrial contraction)   . Chronic constipation     takes Mineral Oil,Juice,Enema(prn),and Miralax(Prn) and Cascara nightly  . Insomnia     takes restoril nightly and Xanax  . Hypothyroidism   . Seasonal allergies     takes Allegra in am and Benadryl at night  . Occlusion and stenosis of carotid artery without mention of cerebral infarction 10/26/2012  . 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 Acute cholecystitis 10/07/2012    s/p pec drain due to recent CVA, pending chole 11/2012  . Difficult intubation     grade 3 airway  . PONV (postoperative nausea and vomiting)     "I vomit for 5 days straight w/certain RX w/thanes" (12/28/2012)  . Hypertension     "not since carotid OR" (12/28/2012)  . Heart murmur     "flow mumur" (12/28/2012)  .  Exertional dyspnea   . Asthma     "wheeze occasionally; I don't have asthma" (12/28/2012)  . Anemia     "due to polycythemia vera" (12/28/2012)  . History of blood transfusion 09/2012; 12/28/2012  . Seizures     only seizure was 10/14/12 "after carotid endarterectomy";takes Keppra daily  . Stroke 09/2012    "after they put drain in my gallbladder"; residual is "haven't felt well enough to tell since stroke to tell; weak already; weaker when I'm tired" (12/28/2012)  . Chronic back pain   . Anxiety   . Depression   . Polycythemia vera(238.4)     hyperviscosity variant   Past Surgical History  Procedure Laterality Date  . Endarterectomy Left 10/10/2012    Procedure: ENDARTERECTOMY CAROTID- LEFT NECK WITH GREATER SAPHENOUS VEIN PATCH LEFT LEG;  Surgeon: Sherren Kerns, MD;  Location: [facility] OR;  Service: Vascular;  Laterality: Left;  . Carotid endarterectomy Left 10-10-12  . Abdominal hysterectomy  1996  . Tonsillectomy      age 7  . Nasal septum surgery      late 70's  . Colonoscopy    . Insertion of drain  10/02/12    right low abdomen and draining pus  . Picc line placed      10/15/12  . Cholecystectomy N/A 11/29/2012    Procedure: ATTEMPTED LAPAROSCOPIC CHOLECYSTECTOMY ;  Surgeon: Cherylynn Ridges, MD;  Location: [facility] OR;  Service: General;  Laterality: N/A;  . Cholecystectomy N/A 11/29/2012    Procedure: CHOLECYSTECTOMY, COMMON BILE DUCT EXPLORATION, T-TUBE PLACEMENT;  Surgeon: Cherylynn Ridges, MD;  Location: [facility] OR;  Service: General;  Laterality: N/A;  . Tubal ligation  ~ 1972  . Esophagogastroduodenoscopy N/A 01/02/2013    Procedure: ESOPHAGOGASTRODUODENOSCOPY (EGD);  Surgeon: Louis Meckel, MD;  Location[facility] ENDOSCOPY;  Service: Endoscopy;  Laterality: N/A;   family history includes Coronary artery disease in her mother; Heart attack in her father. Current Outpatient Prescriptions  Medication Sig Dispense Refill  . albuterol-ipratropium (COMBIVENT) 18-103 MCG/ACT inhaler Inhale 2 puffs into  the lungs every 6 (six) hours as needed for wheezing.      Marland Kitchen ALPRAZolam (XANAX) 0.5 MG tablet Take 1 tablet (0.5 mg total) by mouth at bedtime.  30 tablet  5  . aspirin 325 MG tablet Take 325 mg by mouth daily after breakfast.       . azithromycin (ZITHROMAX) 250 MG tablet Take by mouth daily. 2 tabs first 3 day then 1 tab a day til done      . bisoprolol (ZEBETA) 10 MG tablet Take 10 mg by mouth daily after breakfast.       . Cascara [place] 450 MG CAPS Take 900 mg by mouth at bedtime.      . CEPHALEXIN PO Take 500 mg by mouth 2 (two) times daily.      . cholecalciferol (VITAMIN D) 1000 UNITS tablet Take 3,000 Units by mouth daily after breakfast.       . CIPROFLOXACIN HCL PO Take 500 mg by mouth 2 (two) times daily.      . clopidogrel (PLAVIX) 75 MG tablet Take 75 mg by mouth daily after breakfast.       . cyanocobalamin (,VITAMIN B-12,) 1000 MCG/ML injection Inject 1,000 mcg into the muscle as needed (for energy).      . cyclobenzaprine (FLEXERIL) 10 MG tablet Take 10 mg by mouth at bedtime and may repeat dose one time if needed. may take 10 mg  twice a day as needed for spasms. 30 mg daily      . diphenhydrAMINE (BENADRYL) 25 MG tablet Take 50 mg by mouth every 8 (eight) hours as needed. Patient takes 50 mg at bedtime each day.  Patient may take 50 mg three times a day as needed.      . docusate sodium (COLACE) 100 MG capsule Take 100 mg by mouth 3 (three) times daily with meals.       Marland Kitchen estrogens, conjugated, (PREMARIN) 0.625 MG tablet Take 0.625 mg by mouth daily after breakfast.       . famotidine (PEPCID) 20 MG tablet Take 20 mg by mouth 2 (two) times daily before a meal.      . fexofenadine (ALLEGRA) 180 MG tablet Take 180 mg by mouth daily after breakfast.       . fluconazole (DIFLUCAN) 100 MG tablet Take 100 mg by mouth daily.      . fluoxymesterone (ANDROXY) 10 MG tablet Take 2.5 mg by mouth daily as needed (before sexual activity).       . furosemide (LASIX) 40 MG tablet Take 80 mg by  mouth daily after breakfast.       . Garlic Oil 1000 MG CAPS Take 1,000 mg by mouth daily after breakfast.      . hydrochlorothiazide (MICROZIDE) 12.5 MG capsule Take 1 capsule (12.5 mg total) by mouth daily as needed. If Systolic is GREATER than 130  30 capsule  1  . levETIRAcetam (KEPPRA) 500 MG tablet Take 500 mg by mouth 2 (two) times daily.      Marland Kitchen lidocaine-prilocaine (EMLA) cream Apply 1 application topically as needed (to numb the phlebotomy site).      Marland Kitchen lisinopril (PRINIVIL,ZESTRIL) 10 MG tablet Take 10 mg by mouth 2 (two) times daily as needed. If Systolic is GREATER than 130      . magnesium oxide (MAG-OX) 400 MG tablet Take 1,200 mg by mouth daily.      . mineral oil liquid Take 60 mLs by mouth at bedtime. With Juice      . nitroGLYCERIN (NITROSTAT) 0.4 MG SL tablet Place 0.4 mg under the tongue every 5 (five) minutes as needed for chest pain.      Marland Kitchen ondansetron (ZOFRAN) 8 MG tablet Take 1 tablet (8 mg total) by mouth every 8 (eight) hours as needed for nausea.  20 tablet  3  . OxyCODONE (OXYCONTIN) 10 mg T12A 12 hr tablet Take 1 tablet (10 mg total) by mouth every 12 (twelve) hours. BRAND NAME MEDICALLY NECESSARY  60 tablet  0  . OxyCODONE (OXYCONTIN) 20 mg T12A 12 hr tablet Take 20 mg by mouth every 12 (twelve) hours. BRAND NAME MEDICALLY NECESSARY! Take at noon, 8 pm and 4 am      . oxyCODONE (ROXICODONE) 15 MG immediate release tablet Take 1 tablet (15 mg total) by mouth every 8 (eight) hours as needed (for breakthrough pain). BRAND NAME MEDICALLY NECESSARY.  90 tablet  0  . pantoprazole (PROTONIX) 40 MG tablet Take 1 tablet (40 mg total) by mouth daily.  30 tablet  0  . potassium chloride (MICRO-K) 10 MEQ CR capsule Take 80 mEq by mouth daily after breakfast.      . promethazine (PHENERGAN) 25 MG tablet Take 25 mg by mouth every 6 (six) hours as needed for nausea.      . Sennosides (SENNA LAX PO) Take 1 tablet by mouth 3 (three) times daily with meals.       . temazepam (RESTORIL)  30 MG capsule at bedtime. Take 1 capsule as needed for sleep take exactly as directed      . thyroid (ARMOUR) 60 MG tablet Take 150 mg by mouth daily after breakfast. Levothyroxine is not effective. Takes only armour thyroid.      Marland Kitchen triamcinolone cream (KENALOG) 0.1 % Apply 1 application topically 2 (two) times daily as needed (to skin).      . Valerian 100 MG CAPS Take 100 mg by mouth at bedtime as needed (for vitamin).       No current facility-administered medications for this visit.   Allergies as of 03/05/2013 - Review Complete 03/05/2013  Allergen Reaction Noted  . Codeine Nausea Only 09/21/2010  . Synthroid [levothyroxine sodium] Other (See Comments) 09/28/2010  . Calcium channel blockers Palpitations 09/21/2010  . Penicillins Rash 09/21/2010    reports that she has never smoked. She has never used smokeless tobacco. She reports that she does not drink alcohol or use illicit drugs.     Review of Systems: Pertinent positive and negative review of systems were noted in the above HPI section. All other review of systems were otherwise negative.  Vital signs were reviewed in today's medical record Physical Exam: General: Well developed , well nourished but very pale appearing, no acute distress Skin: anicteric Head: Normocephalic and atraumatic Eyes:  sclerae anicteric, EOMI Ears: Normal auditory acuity Mouth: No deformity or lesions Neck: Supple,  no masses or thyromegaly Lungs: Clear throughout to auscultation Heart: Regular rate and rhythm; no  rubs or bruits Abdomen: Soft, non tender and non distended. No masses, hepatosplenomegaly or hernias noted. Normal Bowel sounds.  There is a 1/6 early systolic murmur Rectal:deferred Musculoskeletal: Symmetrical with no gross deformities  Skin: No lesions on visible extremities Pulses:  Normal pulses noted Extremities: No clubbing, cyanosis, edema or deformities noted Neurological: Alert oriented x 4, grossly nonfocal Cervical  Nodes:  No significant cervical adenopathy Inguinal Nodes: No significant inguinal adenopathy Psychological:  Alert and cooperative. Normal mood and affect

## 2013-03-05 NOTE — Progress Notes (Signed)
DIAGNOSES: 1. Polycythemia vera -- hyperviscosity variant. 2. Cerebrovascular accident -- status post left carotid     endarterectomy. 3. Status post cholecystectomy.  CURRENT THERAPY:  Phlebotomy to maintain hemoglobin less than 6.  INTERIM HISTORY:  [name] comes in for followup.  She is doing much better.  She is trying to look like her old self.  She is feeling better.  She is not having as much nausea.  She is not having as much fatigue.  She did have a hard time over the past 2 months with respect to her gallbladder surgery and then having a CVA with seizure.  She is on antiseizure medication with Keppra.  She has had no chest pain.  There has been no leg swelling.  She has had no rashes.  She has a PICC line which we will take out today at the patient's request.  She continues on aspirin and Plavix.  We are making her iron deficient.  Her ferritin was less than 4 today.  PHYSICAL EXAMINATION:  General:  This is a well-developed, well- nourished white female, in no obvious distress.  Vital Signs: Temperature of 98, pulse 82, respiratory rate 14, blood pressure 133/40. Weight is 155 pounds.  Head and Neck:  Normocephalic, atraumatic skull. There are no ocular or oral lesions.  There are no palpable cervical or supraclavicular lymph nodes.  Lungs:  Clear bilaterally.  Cardiac: Regular rate and rhythm with a normal S1, S2.  There are no murmurs, rubs, or bruits.  Abdomen:  Soft.  She has laparoscopy scars and a laparotomy scar.  She has no fluid wave.  There is still some slight tenderness in the right upper quadrant.  She has no mass.  There is no palpable hepatosplenomegaly.  Extremities:  Some trace edema in her legs.  Skin:  No rashes, ecchymosis, or petechiae.  LABORATORY STUDIES:  White cell count is 3.9, hemoglobin 5.7, hematocrit 24.4, platelet count 212.  MCV is 65.  IMPRESSION:  [name] is a 67 year old white female with polycythemia vera.  She has a  hyperviscosity variant in which she really needs to have her hemoglobin below 6 before she runs into problems.  She is on aspirin and Plavix, which I think will help with respect to hyperviscosity issues.  We follow her CBC every 2 weeks.  She would like to have it followed closely, which I understand.  We will go ahead and plan to get her back to see [place] probably in about 6 weeks or so.    ______________________________ Josph Macho, M.D. PRE/MEDQ  D:  03/02/2013  T:  03/05/2013  Job:  [id number]

## 2013-03-05 NOTE — Patient Instructions (Signed)

## 2013-03-05 NOTE — Telephone Encounter (Signed)
Will forward to  Dr. Brackbill for review 

## 2013-03-05 NOTE — Assessment & Plan Note (Signed)
Clinically resolved.  

## 2013-03-06 ENCOUNTER — Other Ambulatory Visit: Payer: Self-pay | Admitting: Cardiology

## 2013-03-06 NOTE — Telephone Encounter (Signed)
I think we already filled this yesterday.

## 2013-03-07 ENCOUNTER — Encounter: Payer: Self-pay | Admitting: Gastroenterology

## 2013-03-14 ENCOUNTER — Other Ambulatory Visit: Payer: Medicare Other | Admitting: Lab

## 2013-03-14 ENCOUNTER — Other Ambulatory Visit: Payer: Self-pay | Admitting: *Deleted

## 2013-03-14 ENCOUNTER — Other Ambulatory Visit (HOSPITAL_BASED_OUTPATIENT_CLINIC_OR_DEPARTMENT_OTHER): Payer: Medicare Other | Admitting: Lab

## 2013-03-14 DIAGNOSIS — M797 Fibromyalgia: Secondary | ICD-10-CM

## 2013-03-14 DIAGNOSIS — D45 Polycythemia vera: Secondary | ICD-10-CM

## 2013-03-14 DIAGNOSIS — R1011 Right upper quadrant pain: Secondary | ICD-10-CM

## 2013-03-14 DIAGNOSIS — T148XXA Other injury of unspecified body region, initial encounter: Secondary | ICD-10-CM

## 2013-03-14 DIAGNOSIS — K810 Acute cholecystitis: Secondary | ICD-10-CM

## 2013-03-14 LAB — CBC WITH DIFFERENTIAL (CANCER CENTER ONLY)
BASO#: 0 10*3/uL (ref 0.0–0.2)
Eosinophils Absolute: 0.1 10*3/uL (ref 0.0–0.5)
HCT: 24.9 % — ABNORMAL LOW (ref 34.8–46.6)
HGB: 5.9 g/dL — CL (ref 11.6–15.9)
LYMPH#: 0.9 10*3/uL (ref 0.9–3.3)
LYMPH%: 25 % (ref 14.0–48.0)
MCV: 63 fL — ABNORMAL LOW (ref 81–101)
MONO#: 0.5 10*3/uL (ref 0.1–0.9)
NEUT%: 57.2 % (ref 39.6–80.0)
RBC: 3.97 10*6/uL (ref 3.70–5.32)
RDW: 23 % — ABNORMAL HIGH (ref 11.1–15.7)
WBC: 3.6 10*3/uL — ABNORMAL LOW (ref 3.9–10.0)

## 2013-03-14 MED ORDER — OXYCODONE HCL ER 10 MG PO T12A: 10.0000 mg | EXTENDED_RELEASE_TABLET | Freq: Two times a day (BID) | ORAL | Status: DC

## 2013-03-14 MED ORDER — OXYCODONE HCL ER 20 MG PO T12A: 20.0000 mg | EXTENDED_RELEASE_TABLET | Freq: Two times a day (BID) | ORAL | Status: DC

## 2013-03-14 MED ORDER — OXYCODONE HCL 15 MG PO TABS: 15.0000 mg | ORAL_TABLET | Freq: Three times a day (TID) | ORAL | Status: DC | PRN

## 2013-03-14 NOTE — Telephone Encounter (Signed)
Waiting on Dr Twanna Hy to respond Have sent over twice

## 2013-03-15 NOTE — Telephone Encounter (Signed)
Thank You Dr [name]  I was wondering if you prescribe this patients blood thinner, she is having a procedure 1/22 just want to know if she can come off her blood thinner

## 2013-03-15 NOTE — Telephone Encounter (Signed)
Message copied by Marlowe Kays on Thu Mar 15, 2013  8:23 AM ------      Message from: Arlan Organ R      Created: Wed Mar 14, 2013  5:37 PM       Gerilyn Stargell:            How can I help with Ms. Tennis Ship. I have not gotten anything from you about her and will do what I can!!!              Merry Christmas!!            Cindee Lame E ------

## 2013-03-27 NOTE — Telephone Encounter (Signed)
L/m for Dr Drue Dun nurse Amy to contact me

## 2013-03-28 ENCOUNTER — Other Ambulatory Visit (HOSPITAL_BASED_OUTPATIENT_CLINIC_OR_DEPARTMENT_OTHER): Payer: Medicare Other | Admitting: Lab

## 2013-03-28 ENCOUNTER — Telehealth: Payer: Self-pay | Admitting: *Deleted

## 2013-03-28 DIAGNOSIS — K810 Acute cholecystitis: Secondary | ICD-10-CM

## 2013-03-28 DIAGNOSIS — D45 Polycythemia vera: Secondary | ICD-10-CM | POA: Diagnosis not present

## 2013-03-28 DIAGNOSIS — T148XXA Other injury of unspecified body region, initial encounter: Secondary | ICD-10-CM

## 2013-03-28 DIAGNOSIS — R1011 Right upper quadrant pain: Secondary | ICD-10-CM

## 2013-03-28 LAB — CBC WITH DIFFERENTIAL (CANCER CENTER ONLY)
BASO#: 0 10*3/uL (ref 0.0–0.2)
BASO%: 0.3 % (ref 0.0–2.0)
EOS%: 4.1 % (ref 0.0–7.0)
HCT: 26.4 % — ABNORMAL LOW (ref 34.8–46.6)
HGB: 6.2 g/dL — CL (ref 11.6–15.9)
LYMPH%: 17.5 % (ref 14.0–48.0)
MCH: 14.7 pg — ABNORMAL LOW (ref 26.0–34.0)
MCHC: 23.5 g/dL — ABNORMAL LOW (ref 32.0–36.0)
MCV: 63 fL — ABNORMAL LOW (ref 81–101)
NEUT%: 64.2 % (ref 39.6–80.0)
RBC: 4.21 10*6/uL (ref 3.70–5.32)
RDW: 23 % — ABNORMAL HIGH (ref 11.1–15.7)
WBC: 3.4 10*3/uL — ABNORMAL LOW (ref 3.9–10.0)

## 2013-03-28 NOTE — Telephone Encounter (Signed)
Robyn RN called from Dr. Nita Sells office wanting to know when patient should stop Plavix before her procedure on 04/19/13.  Dr. Lupita Leash said patient should stop Plavix on 04/14/13.  Message given to secretary at [employer] who will send Melina Schools this message via epic

## 2013-03-30 ENCOUNTER — Ambulatory Visit (HOSPITAL_BASED_OUTPATIENT_CLINIC_OR_DEPARTMENT_OTHER): Payer: Medicare Other

## 2013-03-30 VITALS — BP 113/54 | HR 82 | Temp 97.6°F | Resp 20

## 2013-03-30 DIAGNOSIS — D45 Polycythemia vera: Secondary | ICD-10-CM | POA: Diagnosis not present

## 2013-03-30 NOTE — Patient Instructions (Signed)

## 2013-03-30 NOTE — Telephone Encounter (Signed)
Dow Adolph Oda Kilts, CMA            JoEllen @ Dr. Loreli Slot office   Pt needs to stop her Bloodthinner on 04-14-13   # [phone]

## 2013-03-30 NOTE — Progress Notes (Signed)
[name] presents today for phlebotomy per MD orders. Phlebotomy procedure started at 1405 and ended at 1415. 500 grams removed. Patient observed for 30 minutes after procedure without any incident. Patient tolerated procedure well. IV needle removed intact.

## 2013-03-30 NOTE — Telephone Encounter (Signed)
Message copied by Oda Kilts on Fri Mar 30, 2013  7:39 AM ------      Message from: Webb Laws D      Created: Wed Mar 28, 2013 11:44 AM       JoEllen @ Dr. Loreli Slot office            Pt needs to stop her Bloodthinner on 04-14-13            # [phone] ------

## 2013-03-30 NOTE — Telephone Encounter (Signed)
discussed with patient her coming off plavix she is aware

## 2013-03-30 NOTE — Telephone Encounter (Signed)
Message copied by Oda Kilts on Fri Mar 30, 2013  7:41 AM ------      Message from: Burney Gauze R      Created: Thu Mar 15, 2013  5:08 PM       Amiere Cawley:            I did not order her Plavix.  This was done by either neurology or vascular surgery when she was in the hospital when she had her CVA.            Personally, I think that she can come off Plavix for her 'scope as her Hgb is well controlled and she has had the carotid surgery.            Merry Christmas!!            Laurey Arrow E ------

## 2013-04-05 ENCOUNTER — Other Ambulatory Visit: Payer: Self-pay | Admitting: Cardiology

## 2013-04-05 ENCOUNTER — Telehealth: Payer: Self-pay | Admitting: Nurse Practitioner

## 2013-04-05 ENCOUNTER — Encounter: Payer: Self-pay | Admitting: Nurse Practitioner

## 2013-04-05 ENCOUNTER — Ambulatory Visit (INDEPENDENT_AMBULATORY_CARE_PROVIDER_SITE_OTHER): Payer: Medicare Other | Admitting: Nurse Practitioner

## 2013-04-05 VITALS — BP 138/68 | HR 77 | Ht 62.0 in | Wt 158.0 lb

## 2013-04-05 DIAGNOSIS — R569 Unspecified convulsions: Secondary | ICD-10-CM

## 2013-04-05 DIAGNOSIS — I63239 Cerebral infarction due to unspecified occlusion or stenosis of unspecified carotid arteries: Secondary | ICD-10-CM | POA: Diagnosis not present

## 2013-04-05 NOTE — Patient Instructions (Signed)
Continue aspirin 325 mg orally every day and clopidogrel 75 mg orally every day  for secondary stroke prevention and maintain strict control of hypertension with blood pressure goal below 130/90, diabetes with hemoglobin A1c goal below 6.5% and lipids with LDL cholesterol goal below 100 mg/dL.  Continue current dose of Keppra. Followup in the future in 6 months.

## 2013-04-05 NOTE — Progress Notes (Signed)
PATIENT: [name] DOB: [birth year]   REASON FOR VISIT: follow up for stroke, seizures HISTORY FROM: patient  HISTORY OF PRESENT ILLNESS: [name] is an 68 y.o. female with a history of polycythemia, hypertension and hyperlipidemia who was admitted on 09/30/2012 with acute cholecystitis. She status post drain placement. She experienced sudden onset of weakness involving her right hand as well as change in speech at around noon on 10/04/2012 in the hospital. MRI of her brain performed 10/05/2012 showed left MCA/ACA watershed infarcts. MRA showed markedly diminished flow and possible obstruction of left ICA. Patient has been taking aspirin and Plavix since admission. NIH stroke score was 2. Patient was not a TPA candidate secondary to recent drain placement and lack of recognition of acute stroke.   UPDATE 12/21/12 (LL): [name] comes in for hospital follow up. She is post- cholecystectomy, and post left carotid endarterectomy. She developed seizures after discharge and was readmitted and is on Keppra now, no seizures since. She continues on Plavix and Aspitin 325 mg daily due to polycythemia. Patient denies medication side effects, with no signs of bleeding or excessive bruising. She is getting home PT/OT/ST. She states that she has not recovered the fine-motor skills in her right hand, but right leg is good. She can walk short distances before getting fatigued. She is very weak today, hgb <5, polycythemia is followed by Dr. Marin Olp. She has lost 30 lbs. Since going into the hospital in July.   UPDATE 04/05/13 (LL):  Mrs. [name] comes in for stroke revisit.  She states she is doing well.  She has not had any recurrent seizures.  Her follow up EEG was negative, but she declined the evoked potential part of the test. Her polycythemia is stable, she had phlebotomy last Friday.  She states that her BP is well controlled, it is 138/68 in office today.  She has colonoscopy scheduled in the next 2  weeks. Doing well.  REVIEW OF SYSTEMS: Full 14 system review of systems performed and notable only for:  constitutional:  fatigue  cardiovascular: flow murmur  endocrine: feeling cold  ear/nose/throat: runny nose Hematology/Lymph: anemia   Gastrointestinal: constipation allergy/immunology: allergies, skin sensitivity   PHYSICAL EXAM  Filed Vitals:   04/05/13 1353  BP: 138/68  Pulse: 77  Height: 5\' 2"  (1.575 m)  Weight: 158 lb (71.668 kg)   Body mass index is 28.89 kg/(m^2).  Generalized: Well developed, in no acute distress  Head: normocephalic and atraumatic. Oropharynx benign  Neck: Supple, left carotid bruit Cardiac: Regular rate rhythm,  2/6 systolic murmur  Musculoskeletal: No deformity   Neurological examination  Mentation: Alert oriented to time, place, history taking. Follows all commands speech and language fluent Cranial nerve II-XII: Pupils were equal round reactive to light extraocular movements were full, visual field were full on confrontational test. Facial sensation and strength were normal. hearing was intact to finger rubbing bilaterally. Uvula tongue midline. head turning and shoulder shrug and were normal and symmetric.Tongue protrusion into cheek strength was normal. MOTOR: The patient has good strength in all 4 extremities, with exception of slight weakness of the right upper extremity. Decreased fine motor skill in right hand. Left orbits right hand. Sensory: normal and symmetric to light touch Coordination: finger-nose-finger, heel-to-shin bilaterally, no dysmetria Reflexes:  Deep tendon reflexes in the upper and lower extremities are present and symmetric.  Gait and Station: Rising up from seated position without assistance, normal stance, without trunk ataxia, moderate stride, good arm swing, smooth  turning, able to perform tiptoe, and heel walking without difficulty. Tandem unsteady.  DIAGNOSTIC DATA (LABS, IMAGING, TESTING) - I reviewed patient  records, labs, notes, testing and imaging myself where available.  Lab Results  Component Value Date   WBC 3.4* 03/28/2013   HGB 6.2* 03/28/2013   HCT 26.4* 03/28/2013   MCV 63* 03/28/2013   PLT 181 03/28/2013      Component Value Date/Time   NA 137 01/01/2013 0619   NA 136 12/20/2012 1334   K 3.7 01/01/2013 0619   K 4.1 12/20/2012 1334   CL 105 01/01/2013 0619   CL 99 12/20/2012 1334   CO2 27 01/01/2013 0619   CO2 28 12/20/2012 1334   GLUCOSE 90 01/01/2013 0619   GLUCOSE 225* 12/20/2012 1334   BUN 3* 01/01/2013 0619   BUN 8 12/20/2012 1334   CREATININE 0.49* 01/01/2013 0619   CREATININE 0.4* 12/20/2012 1334   CALCIUM 7.6* 01/01/2013 0619   CALCIUM 8.4 12/20/2012 1334   PROT 4.2* 12/29/2012 0440   PROT 6.0* 12/20/2012 1334   ALBUMIN 1.8* 12/29/2012 0440   AST 8 12/29/2012 0440   AST 18 12/20/2012 1334   ALT <5 12/29/2012 0440   ALT 12 12/20/2012 1334   ALKPHOS 50 12/29/2012 0440   ALKPHOS 80 12/20/2012 1334   BILITOT 0.4 12/29/2012 0440   BILITOT 0.60 12/20/2012 1334   GFRNONAA >90 01/01/2013 0619   GFRAA >90 01/01/2013 0619   Lab Results  Component Value Date   CHOL 58 10/07/2012   HDL 11* 10/07/2012   LDLCALC 23 10/07/2012   TRIG 121 10/07/2012   CHOLHDL 5.3 10/07/2012   Lab Results  Component Value Date   HGBA1C 5.5 10/07/2012   Lab Results  Component Value Date   MWUXLKGM01 0272* 10/06/2012   Lab Results  Component Value Date   TSH 0.051* 10/04/2012    ASSESSMENT AND PLAN [name] is a 68 y.o. female who developed right arm weakness and difficulty with speech while hospitalized for a gallbladder attack. 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 Patient with resultant very mild right hemiparesis.   Continue aspirin 325 mg orally every day and clopidogrel 75 mg orally every day  for secondary stroke prevention and maintain strict control of hypertension with blood pressure goal below 130/90, diabetes  with hemoglobin A1c goal below 6.5% and lipids with LDL cholesterol goal below 100 mg/dL.  Continue current dose of Keppra.  We will discuss lowering dose at next visit. Followup in the future in 6 months, must see Dr. Leonie Man next time.  Philmore Pali, MSN, NP-C 04/05/2013, 2:45 PM [facility] [address], [place], [place] [address] [phone]  Note: This document was prepared with digital dictation and possible smart phrase technology. Any transcriptional errors that result from this process are unintentional.

## 2013-04-11 ENCOUNTER — Other Ambulatory Visit (HOSPITAL_BASED_OUTPATIENT_CLINIC_OR_DEPARTMENT_OTHER): Payer: Medicare Other | Admitting: Lab

## 2013-04-11 DIAGNOSIS — K810 Acute cholecystitis: Secondary | ICD-10-CM

## 2013-04-11 DIAGNOSIS — D45 Polycythemia vera: Secondary | ICD-10-CM

## 2013-04-11 DIAGNOSIS — L24A9 Irritant contact dermatitis due friction or contact with other specified body fluids: Secondary | ICD-10-CM

## 2013-04-11 DIAGNOSIS — R1011 Right upper quadrant pain: Secondary | ICD-10-CM

## 2013-04-11 DIAGNOSIS — T148XXA Other injury of unspecified body region, initial encounter: Secondary | ICD-10-CM

## 2013-04-11 LAB — CBC WITH DIFFERENTIAL (CANCER CENTER ONLY)
BASO#: 0 10*3/uL (ref 0.0–0.2)
BASO%: 0.3 % (ref 0.0–2.0)
EOS ABS: 0.2 10*3/uL (ref 0.0–0.5)
EOS%: 6.3 % (ref 0.0–7.0)
HEMATOCRIT: 23.8 % — AB (ref 34.8–46.6)
HEMOGLOBIN: 5.5 g/dL — AB (ref 11.6–15.9)
LYMPH#: 0.8 10*3/uL — ABNORMAL LOW (ref 0.9–3.3)
LYMPH%: 21.8 % (ref 14.0–48.0)
MCH: 14.2 pg — ABNORMAL LOW (ref 26.0–34.0)
MCHC: 23.1 g/dL — ABNORMAL LOW (ref 32.0–36.0)
MCV: 61 fL — AB (ref 81–101)
MONO#: 0.5 10*3/uL (ref 0.1–0.9)
MONO%: 13.6 % — ABNORMAL HIGH (ref 0.0–13.0)
NEUT#: 2.2 10*3/uL (ref 1.5–6.5)
NEUT%: 58 % (ref 39.6–80.0)
Platelets: 199 10*3/uL (ref 145–400)
RBC: 3.88 10*6/uL (ref 3.70–5.32)
RDW: 22.3 % — ABNORMAL HIGH (ref 11.1–15.7)
WBC: 3.8 10*3/uL — AB (ref 3.9–10.0)

## 2013-04-12 ENCOUNTER — Telehealth: Payer: Self-pay | Admitting: *Deleted

## 2013-04-12 NOTE — Telephone Encounter (Signed)
Message copied by Oda Kilts on Thu Apr 12, 2013  4:49 PM ------      Message from: Osvaldo Angst      Created: Thu Apr 12, 2013  3:03 PM      Regarding: Pt who does not qualify for [place] [facility]       Doc,            This pt is a well documented difficult intubation.  Unfortunately, she does not qualify for anesthetic care at [facility].  Thank you for your consideration in this matter.            Kind regards,            John ------

## 2013-04-12 NOTE — Telephone Encounter (Signed)
Patient has to be rescheduled to [facility]  For not being to be intibated

## 2013-04-13 NOTE — Telephone Encounter (Signed)
Patient has been rescheduled for 2/16 patient aware

## 2013-04-19 ENCOUNTER — Encounter: Payer: Medicare Other | Admitting: Gastroenterology

## 2013-04-24 ENCOUNTER — Ambulatory Visit (HOSPITAL_BASED_OUTPATIENT_CLINIC_OR_DEPARTMENT_OTHER): Payer: Medicare Other | Admitting: Hematology & Oncology

## 2013-04-24 ENCOUNTER — Other Ambulatory Visit (HOSPITAL_BASED_OUTPATIENT_CLINIC_OR_DEPARTMENT_OTHER): Payer: Medicare Other | Admitting: Lab

## 2013-04-24 ENCOUNTER — Encounter: Payer: Self-pay | Admitting: Hematology & Oncology

## 2013-04-24 VITALS — Ht 63.0 in | Wt 155.0 lb

## 2013-04-24 DIAGNOSIS — D45 Polycythemia vera: Secondary | ICD-10-CM

## 2013-04-24 DIAGNOSIS — R300 Dysuria: Secondary | ICD-10-CM | POA: Diagnosis not present

## 2013-04-24 LAB — CBC WITH DIFFERENTIAL (CANCER CENTER ONLY)
BASO#: 0 10*3/uL (ref 0.0–0.2)
BASO%: 0.4 % (ref 0.0–2.0)
EOS%: 5.6 % (ref 0.0–7.0)
Eosinophils Absolute: 0.3 10*3/uL (ref 0.0–0.5)
HEMATOCRIT: 26.7 % — AB (ref 34.8–46.6)
HGB: 6.2 g/dL — CL (ref 11.6–15.9)
LYMPH#: 0.7 10*3/uL — ABNORMAL LOW (ref 0.9–3.3)
LYMPH%: 12.6 % — ABNORMAL LOW (ref 14.0–48.0)
MCH: 14.5 pg — ABNORMAL LOW (ref 26.0–34.0)
MCHC: 23.2 g/dL — ABNORMAL LOW (ref 32.0–36.0)
MCV: 63 fL — AB (ref 81–101)
MONO#: 0.6 10*3/uL (ref 0.1–0.9)
MONO%: 11.5 % (ref 0.0–13.0)
NEUT#: 3.6 10*3/uL (ref 1.5–6.5)
NEUT%: 69.9 % (ref 39.6–80.0)
Platelets: 205 10*3/uL (ref 145–400)
RBC: 4.27 10*6/uL (ref 3.70–5.32)
RDW: 24 % — AB (ref 11.1–15.7)
WBC: 5.1 10*3/uL (ref 3.9–10.0)

## 2013-04-24 LAB — CMP (CANCER CENTER ONLY)
ALT(SGPT): 12 U/L (ref 10–47)
AST: 15 U/L (ref 11–38)
Albumin: 3.6 g/dL (ref 3.3–5.5)
Alkaline Phosphatase: 71 U/L (ref 26–84)
BUN, Bld: 10 mg/dL (ref 7–22)
CHLORIDE: 102 meq/L (ref 98–108)
CO2: 32 meq/L (ref 18–33)
Calcium: 9.1 mg/dL (ref 8.0–10.3)
Creat: 0.6 mg/dl (ref 0.6–1.2)
Glucose, Bld: 83 mg/dL (ref 73–118)
Potassium: 3.4 mEq/L (ref 3.3–4.7)
Sodium: 139 mEq/L (ref 128–145)
Total Bilirubin: 0.7 mg/dl (ref 0.20–1.60)
Total Protein: 7.4 g/dL (ref 6.4–8.1)

## 2013-04-24 LAB — URINALYSIS, MICROSCOPIC (CHCC SATELLITE)
Bilirubin (Urine): NEGATIVE
Blood: NEGATIVE
GLUCOSE UR: NEGATIVE mg/dL
Ketones: NEGATIVE mg/dL
Nitrite: NEGATIVE
PROTEIN: NEGATIVE mg/dL
RBC: NEGATIVE (ref 0–2)
Specific Gravity, Urine: 1.01 (ref 1.003–1.035)
Urobilinogen, UR: 0.2 mg/dL (ref 0.2–1)
pH: 6 (ref 4.60–8.00)

## 2013-04-24 LAB — FERRITIN CHCC: Ferritin: <4 ng/ml — ABNORMAL LOW (ref 9–269)

## 2013-04-24 LAB — TECHNOLOGIST REVIEW CHCC SATELLITE

## 2013-04-24 MED ORDER — CEPHALEXIN 500 MG PO CAPS: 500.0000 mg | ORAL_CAPSULE | Freq: Four times a day (QID) | ORAL | Status: DC

## 2013-04-24 NOTE — Progress Notes (Signed)
This office note has been dictated.

## 2013-04-25 LAB — URINE CULTURE

## 2013-04-25 NOTE — Progress Notes (Signed)
DIAGNOSES: 1. Polycythemia vera -- hyperviscosity variant. 2. 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  CURRENT THERAPY:  Phlebotomy to maintain hemoglobin below 6%.  INTERIM HISTORY:  [name] comes in for followup.  She is doing quite well.  She really looks good.  She got through the holidays without any problems.  She has had no fevers, sweats, or chills.  There has been no headache. There has been no abdominal pain.  She has had no change in bowel or bladder habits.  She has had no leg swelling.  There has been no cough or shortness of breath.  She may have a right carotid endarterectomy sometime this year.  I think she goes back to see the vascular surgeon next in a couple of weeks.  PHYSICAL EXAMINATION:  General:  This is a well-developed, well- nourished white female in no obvious distress.  Vital Signs: Temperature of 97.6, pulse 72, respiratory rate 14, blood pressure 131/55, weight is 155 pounds.  Head and Neck:  Normocephalic, atraumatic skull.  There are no ocular or oral lesions.  There are no palpable cervical or supraclavicular lymph nodes.  Her conjunctivae are pale. Lungs:  Clear.  Cardiac:  Regular rate and rhythm with a normal S1, S2. She has a 1/6 systolic ejection murmur.  Abdomen:  Soft.  She has well- healed laparotomy scars.  She has no fluid wave.  There is no guarding or rebound tenderness.  There is no palpable hepatosplenomegaly.  Back: No tenderness over the spine, ribs, or hips.  Extremities:  Show no clubbing, cyanosis, or edema.  Neurological:  Shows no focal neurological deficits.  LABORATORY STUDIES:  White cell count 5.1, hemoglobin 6.2, hematocrit 26.7, platelet count 205.  IMPRESSION:  [name] is a very nice 68 year old white female.  She has polycythemia vera.  She really had a tough time last year.  She had a necrotic gallbladder.  She had complications from gallbladder surgery. She then had  cerebrovascular accident secondary to carotid artery stenosis.  __________ through everything.  Hopefully, this year will be nice and quiet for her.  She comes in every 2 weeks to have her blood work done.  We will go ahead and phlebotomize her this week.  I will plan to see her back in about 6 weeks time.    ______________________________ Volanda Napoleon, M.D. PRE/MEDQ  D:  04/24/2013  T:  04/25/2013  Job:  [id number]

## 2013-04-26 ENCOUNTER — Telehealth: Payer: Self-pay | Admitting: Nurse Practitioner

## 2013-04-26 NOTE — Telephone Encounter (Addendum)
Message copied by Jimmy Footman on Thu Apr 26, 2013 10:35 AM ------      Message from: Burney Gauze R      Created: Wed Apr 25, 2013  9:36 PM       Call - urine culture is (-).  Pete ------Pt verbalized understanding and appreciation.

## 2013-04-27 ENCOUNTER — Ambulatory Visit (HOSPITAL_BASED_OUTPATIENT_CLINIC_OR_DEPARTMENT_OTHER): Payer: Medicare Other

## 2013-04-27 ENCOUNTER — Other Ambulatory Visit: Payer: Self-pay | Admitting: *Deleted

## 2013-04-27 VITALS — BP 95/56 | HR 70 | Temp 98.5°F

## 2013-04-27 DIAGNOSIS — D45 Polycythemia vera: Secondary | ICD-10-CM | POA: Diagnosis not present

## 2013-04-27 DIAGNOSIS — M797 Fibromyalgia: Secondary | ICD-10-CM

## 2013-04-27 MED ORDER — OXYCODONE HCL ER 20 MG PO T12A: 20.0000 mg | EXTENDED_RELEASE_TABLET | Freq: Three times a day (TID) | ORAL | Status: DC | PRN

## 2013-04-27 MED ORDER — OXYCODONE HCL 15 MG PO TABS: 15.0000 mg | ORAL_TABLET | Freq: Three times a day (TID) | ORAL | Status: DC | PRN

## 2013-04-27 MED ORDER — OXYCODONE HCL ER 10 MG PO T12A: 10.0000 mg | EXTENDED_RELEASE_TABLET | Freq: Two times a day (BID) | ORAL | Status: DC

## 2013-04-27 MED ORDER — LIDOCAINE-PRILOCAINE 2.5-2.5 % EX CREA: 1.0000 "application " | TOPICAL_CREAM | CUTANEOUS | Status: DC | PRN

## 2013-04-27 NOTE — Patient Instructions (Signed)

## 2013-04-27 NOTE — Progress Notes (Signed)
[name] presents today for phlebotomy per MD orders. Phlebotomy procedure started at 1205 and ended at 1224. 500 grams removed. Patient observed for 30 minutes after procedure without any incident. Patient tolerated procedure well. IV needle removed intact.

## 2013-05-03 ENCOUNTER — Other Ambulatory Visit (HOSPITAL_COMMUNITY): Payer: Medicare Other

## 2013-05-03 ENCOUNTER — Other Ambulatory Visit: Payer: Self-pay | Admitting: Neurology

## 2013-05-03 ENCOUNTER — Ambulatory Visit: Payer: Medicare Other | Admitting: Vascular Surgery

## 2013-05-09 ENCOUNTER — Other Ambulatory Visit (HOSPITAL_BASED_OUTPATIENT_CLINIC_OR_DEPARTMENT_OTHER): Payer: Medicare Other | Admitting: Lab

## 2013-05-09 ENCOUNTER — Encounter: Payer: Self-pay | Admitting: Family

## 2013-05-09 DIAGNOSIS — K810 Acute cholecystitis: Secondary | ICD-10-CM

## 2013-05-09 DIAGNOSIS — T148XXA Other injury of unspecified body region, initial encounter: Secondary | ICD-10-CM

## 2013-05-09 DIAGNOSIS — D45 Polycythemia vera: Secondary | ICD-10-CM

## 2013-05-09 DIAGNOSIS — L24A9 Irritant contact dermatitis due friction or contact with other specified body fluids: Secondary | ICD-10-CM

## 2013-05-09 DIAGNOSIS — R1011 Right upper quadrant pain: Secondary | ICD-10-CM

## 2013-05-09 LAB — CBC WITH DIFFERENTIAL (CANCER CENTER ONLY)
BASO#: 0 10*3/uL (ref 0.0–0.2)
BASO%: 0.3 % (ref 0.0–2.0)
EOS ABS: 0.1 10*3/uL (ref 0.0–0.5)
EOS%: 2.6 % (ref 0.0–7.0)
HCT: 23.9 % — ABNORMAL LOW (ref 34.8–46.6)
HEMOGLOBIN: 5.6 g/dL — AB (ref 11.6–15.9)
LYMPH#: 0.6 10*3/uL — AB (ref 0.9–3.3)
LYMPH%: 17.8 % (ref 14.0–48.0)
MCH: 14.4 pg — ABNORMAL LOW (ref 26.0–34.0)
MCHC: 23.4 g/dL — AB (ref 32.0–36.0)
MCV: 61 fL — ABNORMAL LOW (ref 81–101)
MONO#: 0.5 10*3/uL (ref 0.1–0.9)
MONO%: 13.8 % — ABNORMAL HIGH (ref 0.0–13.0)
NEUT%: 65.5 % (ref 39.6–80.0)
NEUTROS ABS: 2.3 10*3/uL (ref 1.5–6.5)
Platelets: 209 10*3/uL (ref 145–400)
RBC: 3.9 10*6/uL (ref 3.70–5.32)
RDW: 23 % — ABNORMAL HIGH (ref 11.1–15.7)
WBC: 3.5 10*3/uL — ABNORMAL LOW (ref 3.9–10.0)

## 2013-05-10 ENCOUNTER — Telehealth: Payer: Self-pay | Admitting: Cardiology

## 2013-05-10 ENCOUNTER — Ambulatory Visit (HOSPITAL_COMMUNITY)
Admission: RE | Admit: 2013-05-10 | Discharge: 2013-05-10 | Disposition: A | Discharge status: Home / Self Care | Payer: Medicare Other | Source: Ambulatory Visit | Attending: Family | Admitting: Family

## 2013-05-10 ENCOUNTER — Other Ambulatory Visit: Payer: Medicare Other

## 2013-05-10 ENCOUNTER — Ambulatory Visit (INDEPENDENT_AMBULATORY_CARE_PROVIDER_SITE_OTHER): Payer: Medicare Other | Admitting: Family

## 2013-05-10 ENCOUNTER — Encounter: Payer: Self-pay | Admitting: Family

## 2013-05-10 VITALS — BP 110/69 | HR 68 | Resp 16 | Ht 63.0 in | Wt 154.0 lb

## 2013-05-10 DIAGNOSIS — Z48812 Encounter for surgical aftercare following surgery on the circulatory system: Secondary | ICD-10-CM | POA: Diagnosis not present

## 2013-05-10 DIAGNOSIS — I6529 Occlusion and stenosis of unspecified carotid artery: Secondary | ICD-10-CM

## 2013-05-10 DIAGNOSIS — Z9889 Other specified postprocedural states: Secondary | ICD-10-CM | POA: Diagnosis not present

## 2013-05-10 NOTE — Telephone Encounter (Signed)
Pt calls b/c she was scheduled for 2 lab appointments with our office. She has had so many doctor appointments & has one with [facility] & carotid ultrasound today at the hospital. Fasting labs are now for the 05/16/13 lab visit & ov with Dr. Mare Ferrari.  05/10/13 lab cancelled Horton Chin RN

## 2013-05-10 NOTE — Telephone Encounter (Signed)
New message    Patient has questions regarding lab today & 2/18.

## 2013-05-10 NOTE — Progress Notes (Signed)
Established Carotid Patient   History of Present Illness  [name] is a 68 y.o. female patient of Dr. Oneida Alar who returns for followup today after left carotid endarterectomy October 10, 2012. This was done with a saphenous vein patch. She had a seizure several days after discharge from the hospital. She was readmitted [facility] and placed on Keppra. She has recovered since then with no further seizures. She still has some weakness and clumsiness of her right hand.  Still has an external tube draining her CBD form her GB surgery. Had a stroke in 2001, had a clot that went to left eye and damaged the retina, has some peripheral vision in left eye. Had a second stroke July 7 or 9, 2014, then had the left CEA. Has mild right sided weakness since the second stroke. She has no expressive aphasia. Tires easily. She was told it would be longer than 4-6 months after GB and CEA before she starts to feel better.  denies New Medical or Surgical History.  Pt Diabetic: No Pt smoker: non-smoker  Pt meds include: Statin : Yes ASA: Yes Other anticoagulants/antiplatelets: off Plavix in preparation for colonoscopy   Past Medical History  Diagnosis Date  . Fibromyalgia     chronic pain syndrome  . Postmenopausal state     on hormone replacement therapy  . Aortic valve sclerosis     by echocardiogram 12/04/2009  . Hyperlipidemia     "not since carotid OR" (12/28/2012)  . 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 GERD (gastroesophageal reflux disease)   . PAC (premature atrial contraction)   . Chronic constipation     takes Mineral Oil,Juice,Enema(prn),and Miralax(Prn) and Cascara nightly  . Insomnia     takes restoril nightly and Xanax  . Hypothyroidism   . Seasonal allergies     takes Allegra in am and Benadryl at night  . Occlusion and stenosis of carotid artery without mention of cerebral infarction 10/26/2012  . Complex partial seizure  disorder 10/15/2012  . Acute cholecystitis 10/07/2012    s/p pec drain due to recent CVA, pending chole 11/2012  . Difficult intubation     grade 3 airway  . PONV (postoperative nausea and vomiting)     "I vomit for 5 days straight w/certain RX w/thanes" (12/28/2012)  . Hypertension     "not since carotid OR" (12/28/2012)  . Heart murmur     "flow mumur" (12/28/2012)  . Exertional dyspnea   . Asthma     "wheeze occasionally; I don't have asthma" (12/28/2012)  . Anemia     "due to polycythemia vera" (12/28/2012)  . History of blood transfusion 09/2012; 12/28/2012  . Seizures     only seizure was 10/14/12 "after carotid endarterectomy";takes Keppra daily  . Stroke 09/2012    "after they put drain in my gallbladder"; residual is "haven't felt well enough to tell since stroke to tell; weak already; weaker when I'm tired" (12/28/2012)  . Chronic back pain   . Anxiety   . Depression   . 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 Smoking status: Never Smoker   . Smokeless tobacco: Never Used     Comment: never used product  . Alcohol Use: No    Family History Family History  Problem Relation Age of Onset  . Heart attack Father   . Heart disease Father  Heart Disease before age 64  . Coronary artery disease Mother     had aortic valve replacement  . Heart disease Mother   . Heart attack Mother   . Hyperlipidemia Sister   . Hypertension Sister     Surgical History Past Surgical History  Procedure Laterality Date  . Endarterectomy Left 10/10/2012    Procedure: ENDARTERECTOMY CAROTID- LEFT NECK WITH GREATER SAPHENOUS VEIN PATCH LEFT LEG;  Surgeon: Elam Dutch, MD;  Location: [place];  Service: Vascular;  Laterality: Left;  . Carotid endarterectomy Left 10-10-12  . Abdominal hysterectomy  1996  . Tonsillectomy      age 48  . Nasal septum surgery      late 70's  . Colonoscopy    . Insertion of drain  10/02/12    right  low abdomen and draining pus  . Picc line placed      10/15/12  . Tubal ligation  ~ 1972  . Esophagogastroduodenoscopy N/A 01/02/2013    Procedure: ESOPHAGOGASTRODUODENOSCOPY (EGD);  Surgeon: Inda Castle, MD;  Location: [place];  Service: Endoscopy;  Laterality: N/A;  . Cholecystectomy N/A 11/29/2012    Procedure: ATTEMPTED LAPAROSCOPIC CHOLECYSTECTOMY ;  Surgeon: Gwenyth Ober, MD;  Location: [place];  Service: General;  Laterality: N/A;  . Cholecystectomy N/A 11/29/2012    Procedure: CHOLECYSTECTOMY, COMMON BILE DUCT EXPLORATION, T-TUBE PLACEMENT;  Surgeon: Gwenyth Ober, MD;  Location: [place];  Service: General;  Laterality: N/A;    Allergies  Allergen Reactions  . Ethrane [Enflurane] Nausea And Vomiting  . Codeine Nausea Only  . Synthroid [Levothyroxine Sodium] Other (See Comments)    Not effective. Causes excessive sleepiness. Takes armour thyroid  . Calcium Channel Blockers Palpitations  . Penicillins Rash    Current Outpatient Prescriptions  Medication Sig Dispense Refill  . albuterol-ipratropium (COMBIVENT) 18-103 MCG/ACT inhaler Inhale 2 puffs into the lungs every 6 (six) hours as needed for wheezing.      Marland Kitchen ALPRAZolam (XANAX) 0.5 MG tablet Take 1 tablet (0.5 mg total) by mouth at bedtime.  30 tablet  5  . aspirin 325 MG tablet Take 325 mg by mouth daily after breakfast. "to thin  Blood"      . azithromycin (ZITHROMAX) 500 MG tablet Take by mouth daily. 2 tabs first 3 day the 1 till done      . bisoprolol (ZEBETA) 10 MG tablet Take 10 mg by mouth daily.      Rolena Infante [place] 450 MG CAPS Take 900 mg by mouth at bedtime.      . cephALEXin (KEFLEX) 500 MG capsule Take 1 capsule (500 mg total) by mouth 4 (four) times daily.  64 capsule  4  . cholecalciferol (VITAMIN D) 1000 UNITS tablet Take 3,000 Units by mouth daily after breakfast.       . ciprofloxacin (CIPRO) 500 MG/5ML (10%) suspension Take 500 mg by mouth 2 (two) times daily.      . clopidogrel (PLAVIX) 75 MG tablet Take  75 mg by mouth daily after breakfast.       . cyanocobalamin (,VITAMIN B-12,) 1000 MCG/ML injection Inject 1,000 mcg into the muscle as needed (for energy).      . cyclobenzaprine (FLEXERIL) 10 MG tablet Take 10 mg by mouth at bedtime and may repeat dose one time if needed. may take 10 mg  twice a day as needed for spasms. 30 mg daily      . diphenhydrAMINE (BENADRYL) 25 MG tablet Take 50 mg by mouth  every 8 (eight) hours as needed. Patient takes 50 mg at bedtime each day.  Patient may take 50 mg three times a day as needed.      . docusate sodium (COLACE) 100 MG capsule Take 100 mg by mouth 3 (three) times daily with meals.       Marland Kitchen estrogens, conjugated, (PREMARIN) 0.625 MG tablet Take 0.625 mg by mouth daily after breakfast.       . famotidine (PEPCID) 20 MG tablet Take 20 mg by mouth 2 (two) times daily before a meal.      . fexofenadine (ALLEGRA) 180 MG tablet Take 180 mg by mouth daily after breakfast.       . fluconazole (DIFLUCAN) 100 MG tablet Take 100 mg by mouth daily.      . fluoxymesterone (ANDROXY) 10 MG tablet Take 2.5 mg by mouth daily as needed (before sexual activity).       . furosemide (LASIX) 40 MG tablet Take 80 mg by mouth daily after breakfast.       . Garlic Oil 2423 MG CAPS Take 1,000 mg by mouth daily after breakfast.      . hydrochlorothiazide (MICROZIDE) 12.5 MG capsule Take 1 capsule (12.5 mg total) by mouth daily as needed. If Systolic is GREATER than 536  30 capsule  1  . levETIRAcetam (KEPPRA) 500 MG tablet TAKE 1 TABLET BY MOUTH TWICE A DAY  60 tablet  6  . lidocaine-prilocaine (EMLA) cream Apply 1 application topically as needed (to numb the phlebotomy site).  30 g  3  . lisinopril (PRINIVIL,ZESTRIL) 10 MG tablet Take 10 mg by mouth 2 (two) times daily as needed. If Systolic is GREATER than 144      . magnesium oxide (MAG-OX) 400 MG tablet Take 1,200 mg by mouth daily.      . mineral oil liquid Take 60 mLs by mouth at bedtime. With Juice      . Na Sulfate-K  Sulfate-Mg Sulf (SUPREP BOWEL PREP) SOLN Take 1 kit by mouth once.  1 Bottle  0  . nitroGLYCERIN (NITROSTAT) 0.4 MG SL tablet Place 0.4 mg under the tongue every 5 (five) minutes as needed for chest pain.      Marland Kitchen ondansetron (ZOFRAN) 8 MG tablet Take 1 tablet (8 mg total) by mouth every 8 (eight) hours as needed for nausea.  20 tablet  3  . OxyCODONE (OXYCONTIN) 10 mg T12A 12 hr tablet Take 1 tablet (10 mg total) by mouth every 12 (twelve) hours. BRAND NAME MEDICALLY NECESSARY  60 tablet  0  . OxyCODONE (OXYCONTIN) 20 mg T12A 12 hr tablet Take 1 tablet (20 mg total) by mouth every 8 (eight) hours as needed. BRAND NAME MEDICALLY NECESSARY! Take at noon, 8 pm and 4 am  90 tablet  0  . oxyCODONE (ROXICODONE) 15 MG immediate release tablet Take 1 tablet (15 mg total) by mouth every 8 (eight) hours as needed (for breakthrough pain). BRAND NAME MEDICALLY NECESSARY.  75 tablet  0  . pantoprazole (PROTONIX) 40 MG tablet Take 1 tablet (40 mg total) by mouth daily.  30 tablet  0  . potassium chloride (MICRO-K) 10 MEQ CR capsule TAKE 1 CAPSULE 8 TIMES A DAY  240 capsule  9  . promethazine (PHENERGAN) 25 MG tablet Take 25 mg by mouth every 6 (six) hours as needed for nausea.      . Sennosides (SENNA LAX PO) Take by mouth 3 (three) times daily after meals.      Marland Kitchen  temazepam (RESTORIL) 30 MG capsule at bedtime. Take 1 capsule as needed for sleep take exactly as directed      . thyroid (ARMOUR) 60 MG tablet Take 150 mg by mouth daily after breakfast. Levothyroxine is not effective. Takes only armour thyroid.      Marland Kitchen triamcinolone cream (KENALOG) 0.1 % Apply 1 application topically 2 (two) times daily as needed (to skin).      . Valerian 100 MG CAPS Take 100 mg by mouth at bedtime as needed. Take for sleep      . levETIRAcetam (KEPPRA) 500 MG tablet Take 500 mg by mouth 2 (two) times daily.       No current facility-administered medications for this visit.    Review of Systems : See HPI for pertinent positives and  negatives.  Physical Examination  Filed Vitals:   05/10/13 1355  BP: 110/69  Pulse: 68  Resp: 16   Filed Weights   05/10/13 1355  Weight: 154 lb (69.854 kg)   Body mass index is 27.29 kg/(m^2).   General: WDWN female in NAD GAIT: normal Eyes: PERRLA Pulmonary:  Non-labored, CTAB, Negative  Rales, Negative rhonchi, & Negative wheezing.  Cardiac: regular Rhythm ,  Negative detected murmur.  VASCULAR EXAM Carotid Bruits Left Right   Negative Negative    Aorta is not palpable. Radial pulses are 1+ palpable and equal.                                                                                                                            LE Pulses LEFT RIGHT       FEMORAL   palpable   palpable        POPLITEAL  not palpable   not palpable       POSTERIOR TIBIAL   palpable    palpable        DORSALIS PEDIS      ANTERIOR TIBIAL  palpable   palpable     Gastrointestinal: moderate tenderness to palpation in RUQ and RLQ,  negative masses.  Musculoskeletal: Negative muscle atrophy/wasting. M/S 4/5 throughout, Extremities without ischemic changes.  Neurologic: A&O X 3; Appropriate Affect ; SENSATION ;normal;  Speech is normal CN 2-12 intact, Pain and light touch intact in extremities, Motor exam as listed above.   Non-Invasive Vascular Imaging CAROTID DUPLEX 05/10/2013   CEREBROVASCULAR DUPLEX EVALUATION    INDICATION: Follow-up carotid disease     PREVIOUS INTERVENTION(S): Left carotid endarterectomy 10/10/2012    DUPLEX EXAM:     RIGHT  LEFT  Peak Systolic Velocities (cm/s) End Diastolic Velocities (cm/s) Plaque LOCATION Peak Systolic Velocities (cm/s) End Diastolic Velocities (cm/s) Plaque  119 22  CCA PROXIMAL 159 35   105 31  CCA MID 103 26   94 29  CCA DISTAL 115 30   97 13  ECA 105 13   90 21  ICA PROXIMAL 127 35   195 64 none ICA MID 122 33  129 41  ICA DISTAL 140 50     .97 ICA / CCA Ratio (PSV) NA  Antegrade  Vertebral Flow Antegrade     Brachial Systolic Pressure (mmHg)   Within normal limits  Brachial Artery Waveforms Within normal limits     Plaque Morphology:  HM = Homogeneous, HT = Heterogeneous, CP = Calcific Plaque, SP = Smooth Plaque, IP = Irregular Plaque     ADDITIONAL FINDINGS:     IMPRESSION: 1. The right internal carotid artery appears to be widely patent with velocities suggestive of 60%-79% stenosis in the mid segment; however, just distal to the carotid bulb, vessel diameter decreases significantly. No plaque is observed. 2. Widely patent left carotid endarterectomy without evidence of restenosis or hyperplasia.  3. Bilateral vertebral artery is antegrade.    Compared to the previous exam:  No previous exam at this facility for comparison.      Assessment: [name] is a 68 y.o. female who presents with no stroke or TIA symptoms since July, 2014.  The right internal carotid artery appears to be widely patent with velocities suggestive of 60%-79% stenosis in the mid segment; however, just distal to the carotid bulb, vessel diameter decreases significantly. No plaque is observed. Widely patent left carotid endarterectomy without evidence of restenosis or hyperplasia.  Bilateral vertebral artery is antegrade. The right ICA stenosis is  Worse, the left ICA has improved from previous exam done at [facility] in October 06, 2012, prior to the right CEA on 10/10/12.  Consider intensive statin therapy which is associated with a greater reduction in CVD risk and improved endothelial function , will defer to PCP.    Plan: Follow-up in 6 months with Carotid Duplex scan.   I discussed in depth with the patient the nature of atherosclerosis, and emphasized the importance of maximal medical management including strict control of blood pressure, blood glucose, and lipid levels, obtaining regular exercise, and continued cessation of smoking.  The patient is aware that without maximal medical management the underlying  atherosclerotic disease process will progress, limiting the benefit of any interventions. The patient was given information about stroke prevention and what symptoms should prompt the patient to seek immediate medical care. Thank you for allowing [place] to participate in this patient's care.  Clemon Chambers, RN, MSN, FNP-C [facility] Office: [facility] Physician: Oneida Alar  05/10/2013 1:52 PM

## 2013-05-10 NOTE — Patient Instructions (Signed)

## 2013-05-11 NOTE — Addendum Note (Signed)
Addended by: Dorthula Rue L on: 05/11/2013 02:39 PM   Modules accepted: Orders

## 2013-05-14 ENCOUNTER — Encounter (HOSPITAL_COMMUNITY): Payer: Self-pay | Admitting: Gastroenterology

## 2013-05-14 ENCOUNTER — Encounter (HOSPITAL_COMMUNITY)
Admission: RE | Disposition: A | Discharge status: Home / Self Care | Payer: Self-pay | Source: Ambulatory Visit | Attending: Gastroenterology

## 2013-05-14 ENCOUNTER — Ambulatory Visit (HOSPITAL_COMMUNITY)
Admission: RE | Admit: 2013-05-14 | Discharge: 2013-05-14 | Disposition: A | Discharge status: Home / Self Care | Payer: Medicare Other | Source: Ambulatory Visit | Attending: Gastroenterology | Admitting: Gastroenterology

## 2013-05-14 DIAGNOSIS — Z79890 Hormone replacement therapy (postmenopausal): Secondary | ICD-10-CM | POA: Insufficient documentation

## 2013-05-14 DIAGNOSIS — G4700 Insomnia, unspecified: Secondary | ICD-10-CM | POA: Diagnosis not present

## 2013-05-14 DIAGNOSIS — I10 Essential (primary) hypertension: Secondary | ICD-10-CM | POA: Diagnosis not present

## 2013-05-14 DIAGNOSIS — F3289 Other specified depressive episodes: Secondary | ICD-10-CM | POA: Insufficient documentation

## 2013-05-14 DIAGNOSIS — D45 Polycythemia vera: Secondary | ICD-10-CM | POA: Insufficient documentation

## 2013-05-14 DIAGNOSIS — G894 Chronic pain syndrome: Secondary | ICD-10-CM | POA: Insufficient documentation

## 2013-05-14 DIAGNOSIS — K922 Gastrointestinal hemorrhage, unspecified: Secondary | ICD-10-CM | POA: Diagnosis not present

## 2013-05-14 DIAGNOSIS — Z8673 Personal history of transient ischemic attack (TIA), and cerebral infarction without residual deficits: Secondary | ICD-10-CM | POA: Insufficient documentation

## 2013-05-14 DIAGNOSIS — K219 Gastro-esophageal reflux disease without esophagitis: Secondary | ICD-10-CM | POA: Diagnosis not present

## 2013-05-14 DIAGNOSIS — Z79899 Other long term (current) drug therapy: Secondary | ICD-10-CM | POA: Insufficient documentation

## 2013-05-14 DIAGNOSIS — E785 Hyperlipidemia, unspecified: Secondary | ICD-10-CM | POA: Diagnosis not present

## 2013-05-14 DIAGNOSIS — Z9089 Acquired absence of other organs: Secondary | ICD-10-CM | POA: Insufficient documentation

## 2013-05-14 DIAGNOSIS — F411 Generalized anxiety disorder: Secondary | ICD-10-CM | POA: Diagnosis not present

## 2013-05-14 DIAGNOSIS — K5900 Constipation, unspecified: Secondary | ICD-10-CM | POA: Diagnosis not present

## 2013-05-14 DIAGNOSIS — I6529 Occlusion and stenosis of unspecified carotid artery: Secondary | ICD-10-CM | POA: Insufficient documentation

## 2013-05-14 DIAGNOSIS — K6389 Other specified diseases of intestine: Secondary | ICD-10-CM | POA: Diagnosis not present

## 2013-05-14 DIAGNOSIS — Z7982 Long term (current) use of aspirin: Secondary | ICD-10-CM | POA: Insufficient documentation

## 2013-05-14 DIAGNOSIS — K921 Melena: Secondary | ICD-10-CM | POA: Diagnosis not present

## 2013-05-14 DIAGNOSIS — F329 Major depressive disorder, single episode, unspecified: Secondary | ICD-10-CM | POA: Insufficient documentation

## 2013-05-14 DIAGNOSIS — J301 Allergic rhinitis due to pollen: Secondary | ICD-10-CM | POA: Diagnosis not present

## 2013-05-14 DIAGNOSIS — E039 Hypothyroidism, unspecified: Secondary | ICD-10-CM | POA: Insufficient documentation

## 2013-05-14 HISTORY — PX: COLONOSCOPY: SHX5424

## 2013-05-14 SURGERY — COLONOSCOPY
Anesthesia: Moderate Sedation

## 2013-05-14 MED ORDER — DIPHENHYDRAMINE HCL 50 MG/ML IJ SOLN
INTRAMUSCULAR | Status: AC
  Filled 2013-05-14: qty 1

## 2013-05-14 MED ORDER — GLUCAGON HCL (RDNA) 1 MG IJ SOLR
INTRAMUSCULAR | Status: DC | PRN
  Administered 2013-05-14: 1 mg via INTRAVENOUS

## 2013-05-14 MED ORDER — FENTANYL CITRATE 0.05 MG/ML IJ SOLN
INTRAMUSCULAR | Status: DC | PRN
  Administered 2013-05-14 (×4): 25 ug via INTRAVENOUS
  Administered 2013-05-14 (×2): 12.5 ug via INTRAVENOUS

## 2013-05-14 MED ORDER — FENTANYL CITRATE 0.05 MG/ML IJ SOLN
INTRAMUSCULAR | Status: AC
  Filled 2013-05-14: qty 4

## 2013-05-14 MED ORDER — SODIUM CHLORIDE 0.9 % IV SOLN
INTRAVENOUS | Status: DC
Start: 2013-05-14 — End: 2013-05-14
  Administered 2013-05-14: 11:00:00 via INTRAVENOUS

## 2013-05-14 MED ORDER — DIPHENHYDRAMINE HCL 50 MG/ML IJ SOLN
INTRAMUSCULAR | Status: DC | PRN
  Administered 2013-05-14 (×2): 25 mg via INTRAVENOUS

## 2013-05-14 MED ORDER — MIDAZOLAM HCL 5 MG/5ML IJ SOLN
INTRAMUSCULAR | Status: DC | PRN
  Administered 2013-05-14 (×5): 2 mg via INTRAVENOUS

## 2013-05-14 MED ORDER — MIDAZOLAM HCL 10 MG/2ML IJ SOLN
INTRAMUSCULAR | Status: AC
  Filled 2013-05-14: qty 4

## 2013-05-14 MED ORDER — GLUCAGON HCL (RDNA) 1 MG IJ SOLR
INTRAMUSCULAR | Status: AC
  Filled 2013-05-14: qty 1

## 2013-05-14 NOTE — Op Note (Signed)
[facility] [place], [id number]   COLONOSCOPY PROCEDURE REPORT  PATIENT: [name], [name]  MR#: [id number] BIRTHDATE: [birth year] , 67  yrs. old GENDER: Female ENDOSCOPIST: Inda Castle, MD REFERRED RZ:NBVAP Marin Olp, M.D. PROCEDURE DATE:  05/14/2013 PROCEDURE:   Colonoscopy, diagnostic ASA CLASS:   Class III INDICATIONS:melena in October, 2014.  Endoscopy not diagnostic for a GI bleeding source MEDICATIONS: These medications were titrated to patient response per physician's verbal order, Fentanyl 125 mcg IV, Versed 10 mg IV, Benadryl 50 mg IV, and Glucagon 1 mg IV  DESCRIPTION OF PROCEDURE:   After the risks benefits and alternatives of the procedure were thoroughly explained, informed consent was obtained.  A digital rectal exam revealed no abnormalities of the rectum.   The     endoscope was introduced through the anus and advanced to the cecum, which was identified by both the appendix and ileocecal valve. No adverse events experienced.   The quality of the prep was Suprep good  The instrument was then slowly withdrawn as the colon was fully examined.      COLON FINDINGS: Moderate melanosis was found throughout the entire examined colon.   A normal appearing cecum, ileocecal valve, and appendiceal orifice were identified.  The ascending, hepatic flexure, transverse, splenic flexure, descending, sigmoid colon and rectum appeared unremarkable.  No polyps or cancers were seen. Retroflexed views revealed no abnormalities. The time to cecum=  . Withdrawal time=10 minutes 0 seconds.  The scope was withdrawn and the procedure completed. COMPLICATIONS: There were no complications.  ENDOSCOPIC IMPRESSION: 1.   Moderate melanosis was found throughout the entire examined colon 2.   Normal colon  RECOMMENDATIONS: 1.  resume Plavix today 2.  Hemoccults in one week  eSigned:  Inda Castle, MD 05/14/2013 12:36 PM   cc:

## 2013-05-14 NOTE — H&P (Signed)
History of Present Illness: 68 year old white female with multiple medical problems including polycythemia vera, chronic pain syndrome, CVA, seizures, seen in the hospital in October, 2014 for intractable nausea and vomiting following cholecystectomy.  She was noted to be anemic with a hemoglobin of 4.1 compare with her baseline of 5-6.  She had 2 days of black stools.  Upper endoscopy demonstrated grade a erosive esophagitis.  She was Hemoccult-positive.  Colonoscopy was recommended but deferred until her medical problems stabilized.  Since discharge she has continued to feel stronger.  She has no specific GI complaints.  She rarely has seen a spot of blood on the toilet tissue.  Last colonoscopy was over 10 years ago.  Hemoglobin last week was 5.7.        Past Medical History   Diagnosis  Date   .  Fibromyalgia         chronic pain syndrome   .  Postmenopausal state         on hormone replacement therapy   .  Aortic valve sclerosis         by echocardiogram 12/04/2009   .  Hyperlipidemia         "not since carotid OR" (12/28/2012)   .  CVA (cerebral infarction)  10-05-12       rHP, improved, complicated by sz event x 1   .  GERD (gastroesophageal reflux disease)     .  PAC (premature atrial contraction)     .  Chronic constipation         takes Mineral Oil,Juice,Enema(prn),and Miralax(Prn) and Cascara nightly   .  Insomnia         takes restoril nightly and Xanax   .  Hypothyroidism     .  Seasonal allergies         takes Allegra in am and Benadryl at night   .  Occlusion and stenosis of carotid artery without mention of cerebral infarction  10/26/2012   .  Complex partial seizure disorder  10/15/2012   .  Acute cholecystitis  10/07/2012       s/p pec drain due to recent CVA, pending chole 11/2012   .  Difficult intubation         grade 3 airway   .  PONV (postoperative nausea and vomiting)         "I vomit for 5 days straight w/certain RX w/thanes" (12/28/2012)   .  Hypertension          "not since carotid OR" (12/28/2012)   .  Heart murmur         "flow mumur" (12/28/2012)   .  Exertional dyspnea     .  Asthma         "wheeze occasionally; I don't have asthma" (12/28/2012)   .  Anemia         "due to polycythemia vera" (12/28/2012)   .  History of blood transfusion  09/2012; 12/28/2012   .  Seizures         only seizure was 10/14/12 "after carotid endarterectomy";takes Keppra daily   .  Stroke  09/2012       "after they put drain in my gallbladder"; residual is "haven't felt well enough to tell since stroke to tell; weak already; weaker when I'm tired" (12/28/2012)   .  Chronic back pain     .  Anxiety     .  Depression     .  Polycythemia vera(238.4)  hyperviscosity variant       Past Surgical History   Procedure  Laterality  Date   .  Endarterectomy  Left  10/10/2012       Procedure: ENDARTERECTOMY CAROTID- LEFT NECK WITH GREATER SAPHENOUS VEIN PATCH LEFT LEG;  Surgeon: Elam Dutch, MD;  Location: [facility];  Service: Vascular; Laterality: Left;   .  Carotid endarterectomy  Left  10-10-12   .  Abdominal hysterectomy    1996   .  Tonsillectomy           age 36   .  Nasal septum surgery           late 70's   .  Colonoscopy       .  Insertion of drain    10/02/12       right low abdomen and draining pus   .  Picc line placed           10/15/12   .  Cholecystectomy  N/A  11/29/2012       Procedure: ATTEMPTED LAPAROSCOPIC CHOLECYSTECTOMY ;  Surgeon: Gwenyth Ober, MD;  Location: [facility];  Service: General;  Laterality: N/A;   .  Cholecystectomy  N/A  11/29/2012       Procedure: CHOLECYSTECTOMY, COMMON BILE DUCT EXPLORATION, T-TUBE PLACEMENT;  Surgeon: Gwenyth Ober, MD;  Location: [facility];  Service: General;  Laterality: N/A;   .  Tubal ligation    ~ 1972   .  Esophagogastroduodenoscopy  N/A  01/02/2013       Procedure: ESOPHAGOGASTRODUODENOSCOPY (EGD);  Surgeon: Inda Castle, MD;  Location: [facility];  Service: Endoscopy;  Laterality: N/A;      family history  includes Coronary artery disease in her mother; Heart attack in her father. Current Outpatient Prescriptions   Medication  Sig  Dispense  Refill   .  albuterol-ipratropium (COMBIVENT) 18-103 MCG/ACT inhaler  Inhale 2 puffs into the lungs every 6 (six) hours as needed for wheezing.         Marland Kitchen  ALPRAZolam (XANAX) 0.5 MG tablet  Take 1 tablet (0.5 mg total) by mouth at bedtime.   30 tablet   5   .  aspirin 325 MG tablet  Take 325 mg by mouth daily after breakfast.          .  azithromycin (ZITHROMAX) 250 MG tablet  Take by mouth daily. 2 tabs first 3 day then 1 tab a day til done         .  bisoprolol (ZEBETA) 10 MG tablet  Take 10 mg by mouth daily after breakfast.          .  Cascara [place] 450 MG CAPS  Take 900 mg by mouth at bedtime.         .  CEPHALEXIN PO  Take 500 mg by mouth 2 (two) times daily.         .  cholecalciferol (VITAMIN D) 1000 UNITS tablet  Take 3,000 Units by mouth daily after breakfast.          .  CIPROFLOXACIN HCL PO  Take 500 mg by mouth 2 (two) times daily.         .  clopidogrel (PLAVIX) 75 MG tablet  Take 75 mg by mouth daily after breakfast.          .  cyanocobalamin (,VITAMIN B-12,) 1000 MCG/ML injection  Inject 1,000 mcg into the muscle as needed (for energy).         Marland Kitchen  cyclobenzaprine (FLEXERIL) 10 MG tablet  Take 10 mg by mouth at bedtime and may repeat dose one time if needed. may take 10 mg  twice a day as needed for spasms. 30 mg daily         .  diphenhydrAMINE (BENADRYL) 25 MG tablet  Take 50 mg by mouth every 8 (eight) hours as needed. Patient takes 50 mg at bedtime each day.  Patient may take 50 mg three times a day as needed.         .  docusate sodium (COLACE) 100 MG capsule  Take 100 mg by mouth 3 (three) times daily with meals.          Marland Kitchen  estrogens, conjugated, (PREMARIN) 0.625 MG tablet  Take 0.625 mg by mouth daily after breakfast.          .  famotidine (PEPCID) 20 MG tablet  Take 20 mg by mouth 2 (two) times daily before a meal.         .   fexofenadine (ALLEGRA) 180 MG tablet  Take 180 mg by mouth daily after breakfast.          .  fluconazole (DIFLUCAN) 100 MG tablet  Take 100 mg by mouth daily.         .  fluoxymesterone (ANDROXY) 10 MG tablet  Take 2.5 mg by mouth daily as needed (before sexual activity).          .  furosemide (LASIX) 40 MG tablet  Take 80 mg by mouth daily after breakfast.          .  Garlic Oil 123XX123 MG CAPS  Take 1,000 mg by mouth daily after breakfast.         .  hydrochlorothiazide (MICROZIDE) 12.5 MG capsule  Take 1 capsule (12.5 mg total) by mouth daily as needed. If Systolic is GREATER than [id number]   30 capsule   1   .  levETIRAcetam (KEPPRA) 500 MG tablet  Take 500 mg by mouth 2 (two) times daily.         Marland Kitchen  lidocaine-prilocaine (EMLA) cream  Apply 1 application topically as needed (to numb the phlebotomy site).         Marland Kitchen  lisinopril (PRINIVIL,ZESTRIL) 10 MG tablet  Take 10 mg by mouth 2 (two) times daily as needed. If Systolic is GREATER than [id number]         .  magnesium oxide (MAG-OX) 400 MG tablet  Take 1,200 mg by mouth daily.         .  mineral oil liquid  Take 60 mLs by mouth at bedtime. With Juice         .  nitroGLYCERIN (NITROSTAT) 0.4 MG SL tablet  Place 0.4 mg under the tongue every 5 (five) minutes as needed for chest pain.         Marland Kitchen  ondansetron (ZOFRAN) 8 MG tablet  Take 1 tablet (8 mg total) by mouth every 8 (eight) hours as needed for nausea.   20 tablet   3   .  OxyCODONE (OXYCONTIN) 10 mg T12A 12 hr tablet  Take 1 tablet (10 mg total) by mouth every 12 (twelve) hours. BRAND NAME MEDICALLY NECESSARY   60 tablet   0   .  OxyCODONE (OXYCONTIN) 20 mg T12A 12 hr tablet  Take 20 mg by mouth every 12 (twelve) hours. BRAND NAME MEDICALLY NECESSARY! Take at noon, 8 pm and 4 am         .  oxyCODONE (ROXICODONE) 15 MG immediate release tablet  Take 1 tablet (15 mg total) by mouth every 8 (eight) hours as needed (for breakthrough pain). BRAND NAME MEDICALLY NECESSARY.   90 tablet   0   .  pantoprazole  (PROTONIX) 40 MG tablet  Take 1 tablet (40 mg total) by mouth daily.   30 tablet   0   .  potassium chloride (MICRO-K) 10 MEQ CR capsule  Take 80 mEq by mouth daily after breakfast.         .  promethazine (PHENERGAN) 25 MG tablet  Take 25 mg by mouth every 6 (six) hours as needed for nausea.         .  Sennosides (SENNA LAX PO)  Take 1 tablet by mouth 3 (three) times daily with meals.          .  temazepam (RESTORIL) 30 MG capsule  at bedtime. Take 1 capsule as needed for sleep take exactly as directed         .  thyroid (ARMOUR) 60 MG tablet  Take 150 mg by mouth daily after breakfast. Levothyroxine is not effective. Takes only armour thyroid.         Marland Kitchen  triamcinolone cream (KENALOG) 0.1 %  Apply 1 application topically 2 (two) times daily as needed (to skin).         .  Valerian 100 MG CAPS  Take 100 mg by mouth at bedtime as needed (for vitamin).             No current facility-administered medications for this visit.       Allergies as of 03/05/2013 - Review Complete 03/05/2013   Allergen  Reaction  Noted   .  Codeine  Nausea Only  09/21/2010   .  Synthroid [levothyroxine sodium]  Other (See Comments)  09/28/2010   .  Calcium channel blockers  Palpitations  09/21/2010   .  Penicillins  Rash  09/21/2010       reports that she has never smoked. She has never used smokeless tobacco. She reports that she does not drink alcohol or use illicit drugs.         Review of Systems: Pertinent positive and negative review of systems were noted in the above HPI section. All other review of systems were otherwise negative.   Vital signs were reviewed in today's medical record Physical Exam: General: Well developed , well nourished but very pale appearing, no acute distress Skin: anicteric Head: Normocephalic and atraumatic Eyes:  sclerae anicteric, EOMI Ears: Normal auditory acuity Mouth: No deformity or lesions Neck: Supple, no masses or thyromegaly Lungs: Clear throughout to  auscultation Heart: Regular rate and rhythm; no  rubs or bruits Abdomen: Soft, non tender and non distended. No masses, hepatosplenomegaly or hernias noted. Normal Bowel sounds.  There is a 1/6 early systolic murmur Rectal:deferred Musculoskeletal: Symmetrical with no gross deformities   Skin: No lesions on visible extremities Pulses:  Normal pulses noted Extremities: No clubbing, cyanosis, edema or deformities noted Neurological: Alert oriented x 4, grossly nonfocal Cervical Nodes:  No significant cervical adenopathy Inguinal Nodes: No significant inguinal adenopathy Psychological:  Alert and cooperative. Normal mood and affect                      Nausea and vomiting - Inda Castle, MD at 03/05/2013  3:06 PM      Status: Written Related Problem: Nausea and vomiting    Clinically resolved  Anemia - Inda Castle, MD at 03/05/2013  3:08 PM      Status: Written Related Problem: Anemia    Patient tested Hemoccult positive and had a drop in hemoglobin although clinically was not actively bleeding while in the hospital.  Grade a erosive esophagitis was noted at endoscopy.  Lower GI bleeding source should be ruled out.   Recommendations #1 colonoscopy;  The risk of holding anticoagulation therapy or antiplatelet medications was discussed including the increased risk for thromboembolic disease that may include DVT, pulmonary emboli and stroke. The patient understands this risk and is willing to proceed with temporally holding the medication .

## 2013-05-14 NOTE — Discharge Instructions (Signed)
My office will send you stool cards to check for occult blood in approximately one week. Resume Plavix today  Colonoscopy Care After These instructions give you information on caring for yourself after your procedure. Your doctor may also give you more specific instructions. Call your doctor if you have any problems or questions after your procedure. HOME CARE  Take it easy for the next 24 hours.  Rest.  Walk or use warm packs on your belly (abdomen) if you have belly cramping or gas.  Do not drive for 24 hours.  You may shower.  Do not sign important papers or use machinery for 24 hours.  Drink enough fluids to keep your pee (urine) clear or pale yellow.  Resume your normal diet. Avoid heavy or fried foods.  Avoid alcohol.  Continue taking your normal medicines.  Only take medicine as told by your doctor. Do not take aspirin. If you had growths (polyps) removed:  Do not take aspirin.  Do not drink alcohol for 7 days or as told by your doctor.  Eat a soft diet for 24 hours. GET HELP RIGHT AWAY IF:  You have a fever.  You pass clumps of tissue (blood clots) or fill the toilet with blood.  You have belly pain that gets worse and medicine does not help.  Your belly is puffy (swollen).  You feel sick to your stomach (nauseous) or throw up (vomit). MAKE SURE YOU:  Understand these instructions.  Will watch your condition.  Will get help right away if you are not doing well or get worse. Document Released: 04/17/2010 Document Revised: 06/07/2011 Document Reviewed: 11/20/2012 [facility] Patient Information 2014 [place].  Conscious Sedation, Adult, Care After Refer to this sheet in the next few weeks. These instructions provide you with information on caring for yourself after your procedure. Your health care provider may also give you more specific instructions. Your treatment has been planned according to current medical practices, but problems sometimes  occur. Call your health care provider if you have any problems or questions after your procedure. WHAT TO EXPECT AFTER THE PROCEDURE  After your procedure:  You may feel sleepy, clumsy, and have poor balance for several hours.  Vomiting may occur if you eat too soon after the procedure. HOME CARE INSTRUCTIONS  Do not participate in any activities where you could become injured for at least 24 hours. Do not:  Drive.  Swim.  Ride a bicycle.  Operate heavy machinery.  Cook.  Use power tools.  Climb ladders.  Work from a high place.  Do not make important decisions or sign legal documents until you are improved.  If you vomit, drink water, juice, or soup when you can drink without vomiting. Make sure you have little or no nausea before eating solid foods.  Only take over-the-counter or prescription medicines for pain, discomfort, or fever as directed by your health care provider.  Make sure you and your family fully understand everything about the medicines given to you, including what side effects may occur.  You should not drink alcohol, take sleeping pills, or take medicines that cause drowsiness for at least 24 hours.  If you smoke, do not smoke without supervision.  If you are feeling better, you may resume normal activities 24 hours after you were sedated.  Keep all appointments with your health care provider. SEEK MEDICAL CARE IF:  Your skin is pale or bluish in color.  You continue to feel nauseous or vomit.  Your pain is  getting worse and is not helped by medicine.  You have bleeding or swelling.  You are still sleepy or feeling clumsy after 24 hours. SEEK IMMEDIATE MEDICAL CARE IF:  You develop a rash.  You have difficulty breathing.  You develop any type of allergic problem.  You have a fever. MAKE SURE YOU:  Understand these instructions.  Will watch your condition.  Will get help right away if you are not doing well or get worse. Document  Released: 01/03/2013 Document Reviewed: 10/20/2012 [facility] Patient Information 2014 [place], [place].

## 2013-05-15 ENCOUNTER — Encounter (HOSPITAL_COMMUNITY): Payer: Self-pay | Admitting: Gastroenterology

## 2013-05-16 ENCOUNTER — Ambulatory Visit: Payer: Medicare Other | Admitting: Cardiology

## 2013-05-16 ENCOUNTER — Other Ambulatory Visit: Payer: Medicare Other

## 2013-05-16 ENCOUNTER — Other Ambulatory Visit: Payer: Self-pay | Admitting: Cardiology

## 2013-05-16 DIAGNOSIS — M62838 Other muscle spasm: Secondary | ICD-10-CM

## 2013-05-16 DIAGNOSIS — F419 Anxiety disorder, unspecified: Secondary | ICD-10-CM

## 2013-05-16 MED ORDER — ALPRAZOLAM 0.5 MG PO TABS: 0.5000 mg | ORAL_TABLET | Freq: Four times a day (QID) | ORAL | Status: DC | PRN

## 2013-05-16 MED ORDER — CYCLOBENZAPRINE HCL 10 MG PO TABS: 10.0000 mg | ORAL_TABLET | Freq: Four times a day (QID) | ORAL | Status: DC | PRN

## 2013-05-16 NOTE — Telephone Encounter (Signed)
Patient phoned that her Rx for Xanax and Flexeril be changed back to every 6 hours as needed like she had before. Patient doesn't usually take them that often but has been using them more frequently lately. Discussed with  Dr. Mare Ferrari and ok to change back. Called to [facility] in [place].

## 2013-05-16 NOTE — Telephone Encounter (Signed)
New Message  Pt requests a call back.. She states that it is personnal// No details// Please assist

## 2013-05-21 ENCOUNTER — Other Ambulatory Visit: Payer: Self-pay

## 2013-05-21 DIAGNOSIS — D649 Anemia, unspecified: Secondary | ICD-10-CM

## 2013-05-23 ENCOUNTER — Other Ambulatory Visit: Payer: Medicare Other | Admitting: Lab

## 2013-05-23 ENCOUNTER — Other Ambulatory Visit: Payer: Self-pay

## 2013-05-23 ENCOUNTER — Telehealth: Payer: Self-pay | Admitting: Hematology & Oncology

## 2013-05-23 NOTE — Telephone Encounter (Signed)
Pt not coming due to weather/snow - cancel lab.

## 2013-06-01 ENCOUNTER — Other Ambulatory Visit: Payer: Self-pay | Admitting: Hematology & Oncology

## 2013-06-01 ENCOUNTER — Other Ambulatory Visit: Payer: Self-pay | Admitting: Cardiology

## 2013-06-01 NOTE — Telephone Encounter (Signed)
Ok per  Dr. Brackbill  

## 2013-06-04 ENCOUNTER — Other Ambulatory Visit: Payer: Self-pay | Admitting: Nurse Practitioner

## 2013-06-04 DIAGNOSIS — M797 Fibromyalgia: Secondary | ICD-10-CM

## 2013-06-04 MED ORDER — OXYCODONE HCL 15 MG PO TABS: 15.0000 mg | ORAL_TABLET | Freq: Three times a day (TID) | ORAL | Status: DC | PRN

## 2013-06-04 MED ORDER — OXYCODONE HCL ER 20 MG PO T12A: 20.0000 mg | EXTENDED_RELEASE_TABLET | Freq: Three times a day (TID) | ORAL | Status: DC | PRN

## 2013-06-04 MED ORDER — OXYCODONE HCL ER 10 MG PO T12A
10.0000 mg | EXTENDED_RELEASE_TABLET | Freq: Two times a day (BID) | ORAL | Status: DC
Start: 2013-06-06 — End: 2013-07-02

## 2013-06-06 ENCOUNTER — Ambulatory Visit: Payer: Medicare Other | Admitting: Hematology & Oncology

## 2013-06-06 ENCOUNTER — Other Ambulatory Visit: Payer: Medicare Other | Admitting: Lab

## 2013-06-06 ENCOUNTER — Other Ambulatory Visit (HOSPITAL_BASED_OUTPATIENT_CLINIC_OR_DEPARTMENT_OTHER): Payer: Medicare Other | Admitting: Lab

## 2013-06-06 DIAGNOSIS — K810 Acute cholecystitis: Secondary | ICD-10-CM

## 2013-06-06 DIAGNOSIS — T148XXA Other injury of unspecified body region, initial encounter: Secondary | ICD-10-CM

## 2013-06-06 DIAGNOSIS — R1011 Right upper quadrant pain: Secondary | ICD-10-CM

## 2013-06-06 DIAGNOSIS — D45 Polycythemia vera: Secondary | ICD-10-CM | POA: Diagnosis not present

## 2013-06-06 DIAGNOSIS — L24A9 Irritant contact dermatitis due friction or contact with other specified body fluids: Secondary | ICD-10-CM

## 2013-06-06 LAB — CBC WITH DIFFERENTIAL (CANCER CENTER ONLY)
BASO#: 0 10*3/uL (ref 0.0–0.2)
BASO%: 0.3 % (ref 0.0–2.0)
EOS ABS: 0.2 10*3/uL (ref 0.0–0.5)
EOS%: 4.8 % (ref 0.0–7.0)
HEMATOCRIT: 25.7 % — AB (ref 34.8–46.6)
HGB: 6 g/dL — CL (ref 11.6–15.9)
LYMPH#: 0.7 10*3/uL — AB (ref 0.9–3.3)
LYMPH%: 20.8 % (ref 14.0–48.0)
MCH: 14.8 pg — ABNORMAL LOW (ref 26.0–34.0)
MCHC: 23.3 g/dL — ABNORMAL LOW (ref 32.0–36.0)
MCV: 63 fL — AB (ref 81–101)
MONO#: 0.4 10*3/uL (ref 0.1–0.9)
MONO%: 12.3 % (ref 0.0–13.0)
NEUT#: 2.2 10*3/uL (ref 1.5–6.5)
NEUT%: 61.8 % (ref 39.6–80.0)
Platelets: 187 10*3/uL (ref 145–400)
RBC: 4.06 10*6/uL (ref 3.70–5.32)
RDW: 22.5 % — ABNORMAL HIGH (ref 11.1–15.7)
WBC: 3.5 10*3/uL — AB (ref 3.9–10.0)

## 2013-06-20 ENCOUNTER — Encounter: Payer: Self-pay | Admitting: Hematology & Oncology

## 2013-06-20 ENCOUNTER — Other Ambulatory Visit (HOSPITAL_BASED_OUTPATIENT_CLINIC_OR_DEPARTMENT_OTHER): Payer: Medicare Other | Admitting: Lab

## 2013-06-20 ENCOUNTER — Telehealth: Payer: Self-pay | Admitting: Hematology & Oncology

## 2013-06-20 ENCOUNTER — Ambulatory Visit (HOSPITAL_BASED_OUTPATIENT_CLINIC_OR_DEPARTMENT_OTHER): Payer: Medicare Other | Admitting: Hematology & Oncology

## 2013-06-20 ENCOUNTER — Ambulatory Visit (HOSPITAL_BASED_OUTPATIENT_CLINIC_OR_DEPARTMENT_OTHER): Payer: Medicare Other

## 2013-06-20 VITALS — BP 112/67 | HR 78

## 2013-06-20 VITALS — BP 140/50 | HR 67 | Temp 98.2°F | Resp 14 | Ht 65.0 in | Wt 160.0 lb

## 2013-06-20 DIAGNOSIS — D45 Polycythemia vera: Secondary | ICD-10-CM

## 2013-06-20 LAB — CMP (CANCER CENTER ONLY)
ALBUMIN: 3.4 g/dL (ref 3.3–5.5)
ALT(SGPT): 8 U/L — ABNORMAL LOW (ref 10–47)
AST: 11 U/L (ref 11–38)
Alkaline Phosphatase: 65 U/L (ref 26–84)
BUN: 7 mg/dL (ref 7–22)
CALCIUM: 8.6 mg/dL (ref 8.0–10.3)
CO2: 32 meq/L (ref 18–33)
CREATININE: 0.6 mg/dL (ref 0.6–1.2)
Chloride: 95 mEq/L — ABNORMAL LOW (ref 98–108)
Glucose, Bld: 112 mg/dL (ref 73–118)
Potassium: 3.5 mEq/L (ref 3.3–4.7)
Sodium: 137 mEq/L (ref 128–145)
Total Bilirubin: 0.6 mg/dl (ref 0.20–1.60)
Total Protein: 7 g/dL (ref 6.4–8.1)

## 2013-06-20 LAB — CBC WITH DIFFERENTIAL (CANCER CENTER ONLY)
BASO#: 0 10*3/uL (ref 0.0–0.2)
BASO%: 0.3 % (ref 0.0–2.0)
EOS%: 10.6 % — ABNORMAL HIGH (ref 0.0–7.0)
Eosinophils Absolute: 0.4 10*3/uL (ref 0.0–0.5)
HCT: 27.9 % — ABNORMAL LOW (ref 34.8–46.6)
HEMOGLOBIN: 6.6 g/dL — AB (ref 11.6–15.9)
LYMPH#: 0.6 10*3/uL — ABNORMAL LOW (ref 0.9–3.3)
LYMPH%: 17.1 % (ref 14.0–48.0)
MCH: 15.1 pg — ABNORMAL LOW (ref 26.0–34.0)
MCHC: 23.7 g/dL — ABNORMAL LOW (ref 32.0–36.0)
MCV: 64 fL — AB (ref 81–101)
MONO#: 0.5 10*3/uL (ref 0.1–0.9)
MONO%: 14.3 % — ABNORMAL HIGH (ref 0.0–13.0)
NEUT#: 2 10*3/uL (ref 1.5–6.5)
NEUT%: 57.7 % (ref 39.6–80.0)
Platelets: 160 10*3/uL (ref 145–400)
RBC: 4.36 10*6/uL (ref 3.70–5.32)
RDW: 22.1 % — AB (ref 11.1–15.7)
WBC: 3.5 10*3/uL — ABNORMAL LOW (ref 3.9–10.0)

## 2013-06-20 NOTE — Patient Instructions (Signed)
pTherapeutic Phlebotomy Therapeutic phlebotomy is the controlled removal of blood from your body for the purpose of treating a medical condition. It is similar to donating blood. Usually, about a pint (470 mL) of blood is removed. The average adult has 9 to 12 pints (4.3 to 5.7 L) of blood. Therapeutic phlebotomy may be used to treat the following medical conditions:  Hemochromatosis. This is a condition in which there is too much iron in the blood.  Polycythemia vera. This is a condition in which there are too many red cells in the blood.  Porphyria cutanea tarda. This is a disease usually passed from one generation to the next (inherited). It is a condition in which an important part of hemoglobin is not made properly. This results in the build up of abnormal amounts of porphyrins in the body.  Sickle cell disease. This is an inherited disease. It is a condition in which the red blood cells form an abnormal crescent shape rather than a round shape. LET YOUR CAREGIVER KNOW ABOUT:  Allergies.  Medicines taken including herbs, eyedrops, over-the-counter medicines, and creams.  Use of steroids (by mouth or creams).  Previous problems with anesthetics or numbing medicine.  History of blood clots.  History of bleeding or blood problems.  Previous surgery.  Possibility of pregnancy, if this applies. RISKS AND COMPLICATIONS This is a simple and safe procedure. Problems are unlikely. However, problems can occur and may include:  Nausea or lightheadedness.  Low blood pressure.  Soreness, bleeding, swelling, or bruising at the needle insertion site.  Infection. BEFORE THE PROCEDURE  This is a procedure that can be done as an outpatient. Confirm the time that you need to arrive for your procedure. Confirm whether there is a need to fast or withhold any medications. It is helpful to wear clothing with sleeves that can be raised above the elbow. A blood sample may be done to determine the  amount of red blood cells or iron in your blood. Plan ahead of time to have someone drive you home after the procedure. PROCEDURE The entire procedure from preparation through recovery takes about 1 hour. The actual collection takes about 10 to 15 minutes.  A needle will be inserted into your vein.  Tubing and a collection bag will be attached to that needle.  Blood will flow through the needle and tubing into the collection bag.  You may be asked to open and close your hand slowly and continuously during the entire collection.  Once the specified amount of blood has been removed from your body, the collection bag and tubing will be clamped.  The needle will be removed.  Pressure will be held on the site of the needle insertion to stop the bleeding. Then a bandage will be placed over the needle insertion site. AFTER THE PROCEDURE  Your recovery will be assessed and monitored. If there are no problems, as an outpatient, you should be able to go home shortly after the procedure.  Document Released: 08/17/2010 Document Revised: 06/07/2011 Document Reviewed: 08/17/2010 [facility] Patient Information 2014 [place], [place].

## 2013-06-20 NOTE — Telephone Encounter (Signed)
Called to give pt 2 week lab appointment no answer or voice mail

## 2013-06-20 NOTE — Progress Notes (Signed)
Hematology and Oncology Follow Up Visit  [name] [id number] [birth year] 68 y.o. 06/20/2013   Principle Diagnosis:  Polycythemia vera -- hyperviscosity variant. 2. 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  Current Therapy:   Phlebotomy to maintain hemoglobin below 6%.     Interim History:  Ms.  Courser is for followup. She is doing okay. She does feel that tired. She's had no seizures. She's had no nausea vomiting. She has had some shortness of breath. She thinks that her blood count probably is on the high side today.  She's had no problems with bruising. She did sustain a little cut on the left lower leg. This did bleed a little bit. I think this was probably because of her being on Plavix and an aspirin.  She's had no change in bowel or bladder habits. She's had no fever. There's been no blurred vision.  Medications: Current outpatient prescriptions:albuterol-ipratropium (COMBIVENT) 18-103 MCG/ACT inhaler, Inhale 2 puffs into the lungs every 6 (six) hours as needed for wheezing., Disp: , Rfl: ;  ALPRAZolam (XANAX) 0.5 MG tablet, Take 1 tablet (0.5 mg total) by mouth every 6 (six) hours as needed for anxiety., Disp: 120 tablet, Rfl: 1;  aspirin 325 MG tablet, Take 325 mg by mouth daily after breakfast. "to thin  Blood", Disp: , Rfl:  bisoprolol (ZEBETA) 10 MG tablet, Take 1 tablet (10 mg total) by mouth daily., Disp: 60 tablet, Rfl: 0;  Cascara [place] 450 MG CAPS, Take 900 mg by mouth at bedtime., Disp: , Rfl: ;  cephALEXin (KEFLEX) 500 MG capsule, Take 1 capsule (500 mg total) by mouth 4 (four) times daily., Disp: 64 capsule, Rfl: 4;  cholecalciferol (VITAMIN D) 1000 UNITS tablet, Take 3,000 Units by mouth daily after breakfast. , Disp: , Rfl:  ciprofloxacin (CIPRO) 500 MG/5ML (10%) suspension, Take 500 mg by mouth 2 (two) times daily., Disp: , Rfl: ;  clopidogrel (PLAVIX) 75 MG tablet, TAKE 1 TABLET EVERY DAY, Disp: 30 tablet, Rfl: 3;  COMBIVENT  RESPIMAT 20-100 MCG/ACT AERS respimat, INHALE 2 PUFFS 4 TIMES A DAY AS NEEDED, Disp: 1 Inhaler, Rfl: 2;  cyanocobalamin (,VITAMIN B-12,) 1000 MCG/ML injection, Inject 1,000 mcg into the muscle as needed (for energy)., Disp: , Rfl:  cyclobenzaprine (FLEXERIL) 10 MG tablet, Take 1 tablet (10 mg total) by mouth every 6 (six) hours as needed for muscle spasms. may take 10 mg  twice a day as needed for spasms. 30 mg daily, Disp: 120 tablet, Rfl: 1;  diphenhydrAMINE (BENADRYL) 25 MG tablet, Take 50 mg by mouth every 8 (eight) hours as needed. Patient takes 50 mg at bedtime each day.  Patient may take 50 mg three times a day as needed., Disp: , Rfl:  docusate sodium (COLACE) 100 MG capsule, Take 100 mg by mouth 3 (three) times daily with meals. , Disp: , Rfl: ;  estrogens, conjugated, (PREMARIN) 0.625 MG tablet, Take 0.625 mg by mouth daily after breakfast. , Disp: , Rfl: ;  famotidine (PEPCID) 20 MG tablet, Take 20 mg by mouth 2 (two) times daily before a meal., Disp: , Rfl: ;  fexofenadine (ALLEGRA) 180 MG tablet, Take 180 mg by mouth daily after breakfast. , Disp: , Rfl:  fluconazole (DIFLUCAN) 100 MG tablet, Take 100 mg by mouth daily., Disp: , Rfl: ;  fluoxymesterone (ANDROXY) 10 MG tablet, Take 2.5 mg by mouth daily as needed (before sexual activity). , Disp: , Rfl: ;  furosemide (LASIX) 40 MG tablet, Take  80 mg by mouth daily after breakfast. , Disp: , Rfl: ;  Garlic Oil 3220 MG CAPS, Take 1,000 mg by mouth daily after breakfast., Disp: , Rfl:  hydrochlorothiazide (MICROZIDE) 12.5 MG capsule, Take 1 capsule (12.5 mg total) by mouth daily as needed. If Systolic is GREATER than 254, Disp: 30 capsule, Rfl: 1;  levETIRAcetam (KEPPRA) 500 MG tablet, TAKE 1 TABLET BY MOUTH TWICE A DAY, Disp: 60 tablet, Rfl: 6;  lidocaine-prilocaine (EMLA) cream, Apply 1 application topically as needed (to numb the phlebotomy site)., Disp: 30 g, Rfl: 3 lisinopril (PRINIVIL,ZESTRIL) 10 MG tablet, Take 10 mg by mouth 2 (two) times daily  as needed. If Systolic is GREATER than 270, Disp: , Rfl: ;  magnesium oxide (MAG-OX) 400 MG tablet, Take 1,200 mg by mouth daily., Disp: , Rfl: ;  mineral oil liquid, Take 60 mLs by mouth at bedtime. With Juice, Disp: , Rfl: ;  nitroGLYCERIN (NITROSTAT) 0.4 MG SL tablet, Place 0.4 mg under the tongue every 5 (five) minutes as needed for chest pain., Disp: , Rfl:  ondansetron (ZOFRAN) 8 MG tablet, Take 1 tablet (8 mg total) by mouth every 8 (eight) hours as needed for nausea., Disp: 20 tablet, Rfl: 3;  ondansetron (ZOFRAN-ODT) 4 MG disintegrating tablet, , Disp: , Rfl: ;  OxyCODONE (OXYCONTIN) 10 mg T12A 12 hr tablet, Take 1 tablet (10 mg total) by mouth every 12 (twelve) hours. BRAND NAME MEDICALLY NECESSARY, Disp: 60 tablet, Rfl: 0 OxyCODONE (OXYCONTIN) 20 mg T12A 12 hr tablet, Take 1 tablet (20 mg total) by mouth every 8 (eight) hours as needed. BRAND NAME MEDICALLY NECESSARY! Take at noon, 8 pm and 4 am, Disp: 90 tablet, Rfl: 0;  oxyCODONE (ROXICODONE) 15 MG immediate release tablet, Take 1 tablet (15 mg total) by mouth every 8 (eight) hours as needed (for breakthrough pain). BRAND NAME MEDICALLY NECESSARY., Disp: 75 tablet, Rfl: 0 pantoprazole (PROTONIX) 40 MG tablet, Take 1 tablet (40 mg total) by mouth daily., Disp: 30 tablet, Rfl: 0;  potassium chloride (MICRO-K) 10 MEQ CR capsule, TAKE 1 CAPSULE 8 TIMES A DAY, Disp: 240 capsule, Rfl: 9;  promethazine (PHENERGAN) 25 MG tablet, Take 25 mg by mouth every 6 (six) hours as needed for nausea., Disp: , Rfl: ;  Sennosides (SENNA LAX PO), Take by mouth 3 (three) times daily after meals., Disp: , Rfl:  temazepam (RESTORIL) 30 MG capsule, at bedtime. Take 1 capsule as needed for sleep take exactly as directed, Disp: , Rfl: ;  thyroid (ARMOUR) 60 MG tablet, Take 150 mg by mouth daily after breakfast. Levothyroxine is not effective. Takes only armour thyroid., Disp: , Rfl: ;  triamcinolone cream (KENALOG) 0.1 %, Apply 1 application topically 2 (two) times daily as  needed (to skin)., Disp: , Rfl:  Valerian 100 MG CAPS, Take 100 mg by mouth at bedtime as needed. Take for sleep, Disp: , Rfl: ;  azithromycin (ZITHROMAX) 500 MG tablet, Take by mouth daily. 2 tabs first 3 day the 1 till done, Disp: , Rfl:   Allergies:  Allergies  Allergen Reactions  . Ethrane [Enflurane] Nausea And Vomiting  . Codeine Nausea Only  . Synthroid [Levothyroxine Sodium] Other (See Comments)    Not effective. Causes excessive sleepiness. Takes armour thyroid  . Calcium Channel Blockers Palpitations  . Penicillins Rash    Past Medical History, Surgical history, Social history, and Family History were reviewed and updated.  Review of Systems: As above  Physical Exam:  height is 5\' 5"  (1.651 m) and  weight is 160 lb (72.576 kg). Her oral temperature is 98.2 F (36.8 C). Her blood pressure is 140/50 and her pulse is 67. Her respiration is 14.   Lungs are clear. Cardiac exam regular in rhythm. Abdomen soft. Well-healed laparotomy scars. No palpable liver or spleen tip. Head exam shows pale conjunctiva. No oral lesions. Neck shows well-healed carotid endarterectomy scar in the left neck. Extremities shows some chronic trace edema. She has good strength. Skin exam shows no rashes. Neurological exam no focal deficits.  Lab Results  Component Value Date   WBC 3.5* 06/20/2013   HGB 6.6* 06/20/2013   HCT 27.9* 06/20/2013   MCV 64* 06/20/2013   PLT 160 06/20/2013     Chemistry      Component Value Date/Time   NA 137 06/20/2013 1324   NA 137 01/01/2013 0619   K 3.5 06/20/2013 1324   K 3.7 01/01/2013 0619   CL 95* 06/20/2013 1324   CL 105 01/01/2013 0619   CO2 32 06/20/2013 1324   CO2 27 01/01/2013 0619   BUN 7 06/20/2013 1324   BUN 3* 01/01/2013 0619   CREATININE 0.6 06/20/2013 1324   CREATININE 0.49* 01/01/2013 0619      Component Value Date/Time   CALCIUM 8.6 06/20/2013 1324   CALCIUM 7.6* 01/01/2013 0619   ALKPHOS 65 06/20/2013 1324   ALKPHOS 50 12/29/2012 0440   AST 11 06/20/2013  1324   AST 8 12/29/2012 0440   ALT 8* 06/20/2013 1324   ALT <5 12/29/2012 0440   BILITOT 0.60 06/20/2013 1324   BILITOT 0.4 12/29/2012 0440         Impression and Plan: Ms. Beane is 68 year old female with hyperviscosity polycythemia. She clearly needs be phlebotomized. We will go ahead and phlebotomize her today.  She is iron deficient. She's markedly iron deficient. We have to keep her that way.  She says that the right carotid artery will be looked at with a Doppler ultrasound in 6 months.  We have her blood checked every 2 weeks.  I'll plan to see her back myself in another 2 months.   Volanda Napoleon, MD 3/25/20153:43 PM

## 2013-06-20 NOTE — Progress Notes (Signed)
[name] presents today for phlebotomy per MD orders. Phlebotomy procedure started at 1440 and ended at 1450. 500 grams removed. Patient observed for 30 minutes after procedure without any incident. Patient tolerated procedure well. IV needle removed intact.   

## 2013-06-21 ENCOUNTER — Other Ambulatory Visit: Payer: Self-pay | Admitting: Gastroenterology

## 2013-06-21 ENCOUNTER — Other Ambulatory Visit (INDEPENDENT_AMBULATORY_CARE_PROVIDER_SITE_OTHER): Payer: Medicare Other

## 2013-06-21 ENCOUNTER — Telehealth: Payer: Self-pay | Admitting: Hematology & Oncology

## 2013-06-21 DIAGNOSIS — D649 Anemia, unspecified: Secondary | ICD-10-CM | POA: Diagnosis not present

## 2013-06-21 LAB — FERRITIN CHCC: Ferritin: <4 ng/ml — ABNORMAL LOW (ref 9–269)

## 2013-06-21 LAB — FECAL OCCULT BLOOD, IMMUNOCHEMICAL: Fecal Occult Bld: NEGATIVE

## 2013-06-21 NOTE — Telephone Encounter (Signed)
Called pt to give April schedule no answer or voice mail. I mailed April and May schedule to pt.

## 2013-06-22 ENCOUNTER — Telehealth: Payer: Self-pay | Admitting: Hematology & Oncology

## 2013-06-22 NOTE — Telephone Encounter (Signed)
Pt aware of April,may appointments

## 2013-06-25 NOTE — Progress Notes (Signed)
Quick Note:  Please inform the patient that Hemoccult was negative. No further GI workup. ______

## 2013-06-28 ENCOUNTER — Ambulatory Visit: Payer: Medicare Other | Admitting: Cardiology

## 2013-07-02 ENCOUNTER — Other Ambulatory Visit: Payer: Self-pay | Admitting: Nurse Practitioner

## 2013-07-02 DIAGNOSIS — M797 Fibromyalgia: Secondary | ICD-10-CM

## 2013-07-02 MED ORDER — OXYCODONE HCL 15 MG PO TABS: 15.0000 mg | ORAL_TABLET | Freq: Three times a day (TID) | ORAL | Status: DC | PRN

## 2013-07-02 MED ORDER — OXYCODONE HCL ER 20 MG PO T12A: 20.0000 mg | EXTENDED_RELEASE_TABLET | Freq: Three times a day (TID) | ORAL | Status: DC | PRN

## 2013-07-02 MED ORDER — OXYCODONE HCL ER 10 MG PO T12A: 10.0000 mg | EXTENDED_RELEASE_TABLET | Freq: Two times a day (BID) | ORAL | Status: DC

## 2013-07-04 ENCOUNTER — Other Ambulatory Visit (HOSPITAL_BASED_OUTPATIENT_CLINIC_OR_DEPARTMENT_OTHER): Payer: Medicare Other | Admitting: Lab

## 2013-07-04 DIAGNOSIS — D45 Polycythemia vera: Secondary | ICD-10-CM | POA: Diagnosis not present

## 2013-07-04 DIAGNOSIS — T148XXA Other injury of unspecified body region, initial encounter: Secondary | ICD-10-CM

## 2013-07-04 DIAGNOSIS — K810 Acute cholecystitis: Secondary | ICD-10-CM

## 2013-07-04 DIAGNOSIS — R1011 Right upper quadrant pain: Secondary | ICD-10-CM

## 2013-07-04 DIAGNOSIS — L24A9 Irritant contact dermatitis due friction or contact with other specified body fluids: Secondary | ICD-10-CM

## 2013-07-04 LAB — CBC WITH DIFFERENTIAL (CANCER CENTER ONLY)
BASO#: 0 10*3/uL (ref 0.0–0.2)
BASO%: 0.7 % (ref 0.0–2.0)
EOS ABS: 0.3 10*3/uL (ref 0.0–0.5)
EOS%: 8.7 % — AB (ref 0.0–7.0)
HCT: 24.5 % — ABNORMAL LOW (ref 34.8–46.6)
HEMOGLOBIN: 5.8 g/dL — AB (ref 11.6–15.9)
LYMPH#: 0.6 10*3/uL — ABNORMAL LOW (ref 0.9–3.3)
LYMPH%: 20.3 % (ref 14.0–48.0)
MCH: 14.8 pg — AB (ref 26.0–34.0)
MCHC: 23.7 g/dL — AB (ref 32.0–36.0)
MCV: 62 fL — ABNORMAL LOW (ref 81–101)
MONO#: 0.4 10*3/uL (ref 0.1–0.9)
MONO%: 13.7 % — ABNORMAL HIGH (ref 0.0–13.0)
NEUT%: 56.6 % (ref 39.6–80.0)
NEUTROS ABS: 1.7 10*3/uL (ref 1.5–6.5)
PLATELETS: 198 10*3/uL (ref 145–400)
RBC: 3.93 10*6/uL (ref 3.70–5.32)
RDW: 21.5 % — ABNORMAL HIGH (ref 11.1–15.7)
WBC: 3 10*3/uL — ABNORMAL LOW (ref 3.9–10.0)

## 2013-07-05 ENCOUNTER — Other Ambulatory Visit: Payer: Self-pay | Admitting: Cardiology

## 2013-07-13 ENCOUNTER — Encounter: Payer: Self-pay | Admitting: Cardiology

## 2013-07-13 ENCOUNTER — Ambulatory Visit (INDEPENDENT_AMBULATORY_CARE_PROVIDER_SITE_OTHER): Payer: Medicare Other | Admitting: Cardiology

## 2013-07-13 VITALS — BP 126/62 | HR 58 | Ht 65.0 in | Wt 160.0 lb

## 2013-07-13 DIAGNOSIS — I639 Cerebral infarction, unspecified: Secondary | ICD-10-CM

## 2013-07-13 DIAGNOSIS — M62838 Other muscle spasm: Secondary | ICD-10-CM

## 2013-07-13 DIAGNOSIS — F411 Generalized anxiety disorder: Secondary | ICD-10-CM

## 2013-07-13 DIAGNOSIS — D45 Polycythemia vera: Secondary | ICD-10-CM | POA: Diagnosis not present

## 2013-07-13 DIAGNOSIS — G40909 Epilepsy, unspecified, not intractable, without status epilepticus: Secondary | ICD-10-CM | POA: Diagnosis not present

## 2013-07-13 DIAGNOSIS — F419 Anxiety disorder, unspecified: Secondary | ICD-10-CM

## 2013-07-13 DIAGNOSIS — I6350 Cerebral infarction due to unspecified occlusion or stenosis of unspecified cerebral artery: Secondary | ICD-10-CM

## 2013-07-13 DIAGNOSIS — Z9049 Acquired absence of other specified parts of digestive tract: Secondary | ICD-10-CM

## 2013-07-13 DIAGNOSIS — G4700 Insomnia, unspecified: Secondary | ICD-10-CM

## 2013-07-13 DIAGNOSIS — I119 Hypertensive heart disease without heart failure: Secondary | ICD-10-CM | POA: Diagnosis not present

## 2013-07-13 DIAGNOSIS — Z9089 Acquired absence of other organs: Secondary | ICD-10-CM

## 2013-07-13 DIAGNOSIS — I6529 Occlusion and stenosis of unspecified carotid artery: Secondary | ICD-10-CM

## 2013-07-13 MED ORDER — BISOPROLOL FUMARATE 10 MG PO TABS: 10.0000 mg | ORAL_TABLET | Freq: Every day | ORAL | Status: DC

## 2013-07-13 MED ORDER — FAMOTIDINE 20 MG PO TABS: 20.0000 mg | ORAL_TABLET | Freq: Two times a day (BID) | ORAL | Status: DC

## 2013-07-13 MED ORDER — CYCLOBENZAPRINE HCL 10 MG PO TABS: 10.0000 mg | ORAL_TABLET | Freq: Four times a day (QID) | ORAL | Status: DC | PRN

## 2013-07-13 MED ORDER — TEMAZEPAM 30 MG PO CAPS: ORAL_CAPSULE | ORAL | Status: DC

## 2013-07-13 MED ORDER — ALPRAZOLAM 0.5 MG PO TABS: 0.5000 mg | ORAL_TABLET | Freq: Four times a day (QID) | ORAL | Status: DC | PRN

## 2013-07-13 NOTE — Assessment & Plan Note (Signed)
She is not having any TIA symptoms

## 2013-07-13 NOTE — Assessment & Plan Note (Signed)
Blood pressure is stable on current medication.  No headaches or dizziness.

## 2013-07-13 NOTE — Assessment & Plan Note (Signed)
Patient has a history of severe polycythemia vera.  She does best when her hemoglobin is extremely low in the range of 6 or less.  She is followed closely by Dr. Marin Olp

## 2013-07-13 NOTE — Progress Notes (Signed)
[name] Date of Birth:  05-Feb-1946 [address] [place], [place]  [address] [phone]         Fax   [phone]  History of Present Illness: This pleasant 68 year old Caucasian female is seen for a three-month followup office visit.  She has a history of polycythemia vera and a history of hypothyroidism.  She has a history of aortic valve sclerosis and essential hypertension. She has atypical chest pain and does not have any history of ischemic heart disease.  She has a past history of PVCs. The patient is status post cholecystectomy.  Dr. Hulen Skains is her surgeon.  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  She has had a recent left carotid endarterectomy.  She had carotid duplex ultrasound on 05/10/13 which showed excellent flow in the left operated carotid.  The right carotid showed no significant plaque.  She was readmitted to [facility] with some type of atypical focal seizure and is now on Keppra.  She does not have any history of ischemic heart disease.  She had an echocardiogram 10/04/12 showing ejection fraction 65-70% with grade 1 diastolic dysfunction.  There was elevated pulmonary artery pressure of 53 mm mercury.  Current Outpatient Prescriptions  Medication Sig Dispense Refill  . albuterol-ipratropium (COMBIVENT) 18-103 MCG/ACT inhaler Inhale 2 puffs into the lungs every 6 (six) hours as needed for wheezing.      Marland Kitchen ALPRAZolam (XANAX) 0.5 MG tablet Take 1 tablet (0.5 mg total) by mouth every 6 (six) hours as needed for anxiety.  120 tablet  1  . ARMOUR THYROID 60 MG tablet TAKE 2 AND 1/2 TABLETS BY MOUTH DAILY  75 tablet  5  . aspirin 325 MG tablet Take 325 mg by mouth daily after breakfast. "to thin  Blood"      . azithromycin (ZITHROMAX) 500 MG tablet Take by mouth daily. 2 tabs first 3 day the 1 till done      . bisoprolol (ZEBETA) 10 MG tablet Take 1 tablet (10 mg total) by mouth daily.  60 tablet  11  .  Cascara Sagrada 450 MG CAPS Take 900 mg by mouth at bedtime.      . cephALEXin (KEFLEX) 500 MG capsule Take 1 capsule (500 mg total) by mouth 4 (four) times daily.  64 capsule  4  . cholecalciferol (VITAMIN D) 1000 UNITS tablet Take 3,000 Units by mouth daily after breakfast.       . ciprofloxacin (CIPRO) 500 MG/5ML (10%) suspension Take 500 mg by mouth 2 (two) times daily.      . clopidogrel (PLAVIX) 75 MG tablet TAKE 1 TABLET EVERY DAY  30 tablet  3  . COMBIVENT RESPIMAT 20-100 MCG/ACT AERS respimat INHALE 2 PUFFS 4 TIMES A DAY AS NEEDED  1 Inhaler  2  . cyanocobalamin (,VITAMIN B-12,) 1000 MCG/ML injection Inject 1,000 mcg into the muscle as needed (for energy).      . cyclobenzaprine (FLEXERIL) 10 MG tablet Take 1 tablet (10 mg total) by mouth every 6 (six) hours as needed for muscle spasms. may take 10 mg  twice a day as needed for spasms. 30 mg daily  120 tablet  1  . diphenhydrAMINE (BENADRYL) 25 MG tablet Take 50 mg by mouth every 8 (eight) hours as needed. Patient takes 50 mg at bedtime each day.  Patient may take 50 mg three times a day as needed.      Marland Kitchen  docusate sodium (COLACE) 100 MG capsule Take 100 mg by mouth 3 (three) times daily with meals.       Marland Kitchen estrogens, conjugated, (PREMARIN) 0.625 MG tablet Take 0.625 mg by mouth daily after breakfast.       . famotidine (PEPCID) 20 MG tablet Take 1 tablet (20 mg total) by mouth 2 (two) times daily before a meal.  60 tablet  11  . fexofenadine (ALLEGRA) 180 MG tablet Take 180 mg by mouth daily after breakfast.       . fluconazole (DIFLUCAN) 100 MG tablet Take 100 mg by mouth daily.      . fluoxymesterone (ANDROXY) 10 MG tablet Take 2.5 mg by mouth daily as needed (before sexual activity).       . furosemide (LASIX) 40 MG tablet Take 80 mg by mouth daily after breakfast.       . Garlic Oil 1941 MG CAPS Take 1,000 mg by mouth daily after breakfast.      . hydrochlorothiazide (MICROZIDE) 12.5 MG capsule Take 1 capsule (12.5 mg total) by mouth  daily as needed. If Systolic is GREATER than 740  30 capsule  1  . levETIRAcetam (KEPPRA) 500 MG tablet TAKE 1 TABLET BY MOUTH TWICE A DAY  60 tablet  6  . lidocaine-prilocaine (EMLA) cream Apply 1 application topically as needed (to numb the phlebotomy site).  30 g  3  . lisinopril (PRINIVIL,ZESTRIL) 10 MG tablet Take 10 mg by mouth 2 (two) times daily as needed. If Systolic is GREATER than 814      . magnesium oxide (MAG-OX) 400 MG tablet Take 1,200 mg by mouth daily.      . mineral oil liquid Take 60 mLs by mouth at bedtime. With Juice      . nitroGLYCERIN (NITROSTAT) 0.4 MG SL tablet Place 0.4 mg under the tongue every 5 (five) minutes as needed for chest pain.      Marland Kitchen ondansetron (ZOFRAN) 8 MG tablet Take 1 tablet (8 mg total) by mouth every 8 (eight) hours as needed for nausea.  20 tablet  3  . ondansetron (ZOFRAN-ODT) 4 MG disintegrating tablet       . OxyCODONE (OXYCONTIN) 10 mg T12A 12 hr tablet Take 1 tablet (10 mg total) by mouth every 12 (twelve) hours. BRAND NAME MEDICALLY NECESSARY  60 tablet  0  . OxyCODONE (OXYCONTIN) 20 mg T12A 12 hr tablet Take 1 tablet (20 mg total) by mouth every 8 (eight) hours as needed. BRAND NAME MEDICALLY NECESSARY! Take at noon, 8 pm and 4 am  90 tablet  0  . oxyCODONE (ROXICODONE) 15 MG immediate release tablet Take 1 tablet (15 mg total) by mouth every 8 (eight) hours as needed (for breakthrough pain). BRAND NAME MEDICALLY NECESSARY.  75 tablet  0  . pantoprazole (PROTONIX) 40 MG tablet Take 1 tablet (40 mg total) by mouth daily.  30 tablet  0  . potassium chloride (MICRO-K) 10 MEQ CR capsule TAKE 1 CAPSULE 8 TIMES A DAY  240 capsule  9  . promethazine (PHENERGAN) 25 MG tablet Take 25 mg by mouth every 6 (six) hours as needed for nausea.      . Sennosides (SENNA LAX PO) Take by mouth 3 (three) times daily after meals.      . temazepam (RESTORIL) 30 MG capsule at bedtime. Take 1 capsule as needed for sleep take exactly as directed      . thyroid (ARMOUR) 60  MG tablet Take 150 mg by  mouth daily after breakfast. Levothyroxine is not effective. Takes only armour thyroid.      Marland Kitchen triamcinolone cream (KENALOG) 0.1 % Apply 1 application topically 2 (two) times daily as needed (to skin).      . Valerian 100 MG CAPS Take 100 mg by mouth at bedtime as needed. Take for sleep       No current facility-administered medications for this visit.    Allergies  Allergen Reactions  . Ethrane [Enflurane] Nausea And Vomiting  . Codeine Nausea Only  . Synthroid [Levothyroxine Sodium] Other (See Comments)    Not effective. Causes excessive sleepiness. Takes armour thyroid  . Calcium Channel Blockers Palpitations  . Penicillins Rash    Patient Active Problem List   Diagnosis Date Noted  . Aftercare following surgery of the circulatory system, [place] 05/10/2013  . Protein-calorie malnutrition, severe 12/29/2012  . Nausea and vomiting 12/28/2012  . Anemia 12/28/2012  . Dehydration 12/28/2012  . Hypotension 12/28/2012  . Status post cholecystectomy 12/28/2012  . Occlusion and stenosis of carotid artery with cerebral infarction 12/21/2012  . Seizures 12/21/2012  . Postop check 12/19/2012  . Occlusion and stenosis of carotid artery without mention of cerebral infarction 10/26/2012  . Cholecystostomy tube dysfunction 10/24/2012  . 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 Acute cholecystitis 10/07/2012  . CVA (cerebral infarction) 10/05/2012  . Septic shock 10/02/2012  . Cholelithiases 09/30/2012  . Unspecified constipation 09/30/2012  . Bronchitis 03/08/2011  . Benign hypertensive heart disease without heart failure 09/28/2010  . Aortic valve sclerosis 09/28/2010  . Hypothyroidism 09/28/2010  . Polycythemia vera(238.4) 09/28/2010  . Postmenopausal state 09/28/2010  . Fibromyalgia 09/28/2010    History  Smoking status  . Never Smoker   Smokeless tobacco  . Never Used    Comment: never used tobacco    History  Alcohol Use No    Family  History  Problem Relation Age of Onset  . Heart attack Father   . Heart disease Father     Heart Disease before age 61  . Coronary artery disease Mother     had aortic valve replacement  . Heart disease Mother   . Heart attack Mother   . Hyperlipidemia Sister   . Hypertension Sister     Review of Systems: Constitutional: no fever chills diaphoresis or fatigue or change in weight.  Head and neck: no hearing loss, no epistaxis, no photophobia or visual disturbance. Respiratory: No cough, shortness of breath or wheezing. Cardiovascular: No chest pain peripheral edema, palpitations. Gastrointestinal: No abdominal distention, no abdominal pain, no change in bowel habits hematochezia or melena. Genitourinary: No dysuria, no frequency, no urgency, no nocturia. Musculoskeletal:No arthralgias, no back pain, no gait disturbance or myalgias. Neurological: No dizziness, no headaches, no numbness, no seizures, no syncope, no weakness, no tremors. Hematologic: No lymphadenopathy, no easy bruising. Psychiatric: No confusion, no hallucinations, no sleep disturbance.    Physical Exam: Filed Vitals:   07/13/13 1030  BP: 126/62  Pulse: 58   the general appearance reveals a well-developed well-nourished woman in no distress.  She has marked chronic alopecia.  The left carotid endarterectomy scar is healing well.The head and neck exam reveals pupils equal and reactive.  Extraocular movements are full.  There is no scleral icterus.  The mouth and pharynx are normal.  The neck is supple.  The carotids reveal no bruits.  The jugular venous pressure is normal.  The  thyroid is not enlarged.  There is no lymphadenopathy.  The chest is clear  to percussion and auscultation.  There are no rales or rhonchi.  Expansion of the chest is symmetrical.  The precordium is quiet.  The first heart sound is normal.  The second heart sound is physiologically split.  There is no murmur gallop rub or click.  There is no  abnormal lift or heave.  The abdomen is soft and nontender.  There is a right cholecystostomy tube.  The bowel sounds are normal.  The liver and spleen are not enlarged.  There are no abdominal masses.  There are no abdominal bruits.  Extremities reveal good pedal pulses.  There is no phlebitis or edema.  There is no cyanosis or clubbing.  Strength is normal and symmetrical in all extremities.  There is no lateralizing weakness.  There are no sensory deficits.  The skin is warm and dry.  There is no rash.     Assessment / Plan: Continue on same medication.  Recheck in 3 months for office visit and EKG.

## 2013-07-13 NOTE — Assessment & Plan Note (Signed)
The patient has made an excellent recovery following her cholecystectomy.  She has gained 11 pounds and is eating well. Because of her prior stroke her gynecologist has insisted that she wean herself off her Premarin.

## 2013-07-13 NOTE — Patient Instructions (Signed)
Your physician recommends that you continue on your current medications as directed. Please refer to the Current Medication list given to you today.  Your physician wants you to follow-up in: 3 MONTH OV/EKG  You will receive a reminder letter in the mail two months in advance. If you don't receive a letter, please call our office to schedule the follow-up appointment.  

## 2013-07-18 ENCOUNTER — Other Ambulatory Visit (HOSPITAL_BASED_OUTPATIENT_CLINIC_OR_DEPARTMENT_OTHER): Payer: Medicare Other | Admitting: Lab

## 2013-07-18 DIAGNOSIS — L24A9 Irritant contact dermatitis due friction or contact with other specified body fluids: Secondary | ICD-10-CM

## 2013-07-18 DIAGNOSIS — D45 Polycythemia vera: Secondary | ICD-10-CM

## 2013-07-18 DIAGNOSIS — T148XXA Other injury of unspecified body region, initial encounter: Secondary | ICD-10-CM

## 2013-07-18 DIAGNOSIS — R1011 Right upper quadrant pain: Secondary | ICD-10-CM

## 2013-07-18 DIAGNOSIS — K810 Acute cholecystitis: Secondary | ICD-10-CM

## 2013-07-18 LAB — CBC WITH DIFFERENTIAL (CANCER CENTER ONLY)
BASO#: 0 10*3/uL (ref 0.0–0.2)
BASO%: 0 % (ref 0.0–2.0)
EOS%: 8.3 % — ABNORMAL HIGH (ref 0.0–7.0)
Eosinophils Absolute: 0.3 10*3/uL (ref 0.0–0.5)
HCT: 23.6 % — ABNORMAL LOW (ref 34.8–46.6)
HEMOGLOBIN: 5.7 g/dL — AB (ref 11.6–15.9)
LYMPH#: 0.7 10*3/uL — AB (ref 0.9–3.3)
LYMPH%: 19.8 % (ref 14.0–48.0)
MCH: 15.3 pg — AB (ref 26.0–34.0)
MCHC: 24.2 g/dL — ABNORMAL LOW (ref 32.0–36.0)
MCV: 63 fL — ABNORMAL LOW (ref 81–101)
MONO#: 0.4 10*3/uL (ref 0.1–0.9)
MONO%: 13 % (ref 0.0–13.0)
NEUT#: 2 10*3/uL (ref 1.5–6.5)
NEUT%: 58.9 % (ref 39.6–80.0)
PLATELETS: 179 10*3/uL (ref 145–400)
RBC: 3.73 10*6/uL (ref 3.70–5.32)
RDW: 22.4 % — AB (ref 11.1–15.7)
WBC: 3.4 10*3/uL — ABNORMAL LOW (ref 3.9–10.0)

## 2013-08-01 ENCOUNTER — Other Ambulatory Visit: Payer: Self-pay | Admitting: Cardiology

## 2013-08-01 ENCOUNTER — Other Ambulatory Visit: Payer: Self-pay | Admitting: *Deleted

## 2013-08-01 ENCOUNTER — Other Ambulatory Visit (HOSPITAL_BASED_OUTPATIENT_CLINIC_OR_DEPARTMENT_OTHER): Payer: Medicare Other | Admitting: Lab

## 2013-08-01 DIAGNOSIS — L24A9 Irritant contact dermatitis due friction or contact with other specified body fluids: Secondary | ICD-10-CM

## 2013-08-01 DIAGNOSIS — K810 Acute cholecystitis: Secondary | ICD-10-CM

## 2013-08-01 DIAGNOSIS — M797 Fibromyalgia: Secondary | ICD-10-CM

## 2013-08-01 DIAGNOSIS — R1011 Right upper quadrant pain: Secondary | ICD-10-CM

## 2013-08-01 DIAGNOSIS — T148XXA Other injury of unspecified body region, initial encounter: Secondary | ICD-10-CM

## 2013-08-01 DIAGNOSIS — D45 Polycythemia vera: Secondary | ICD-10-CM

## 2013-08-01 LAB — CBC WITH DIFFERENTIAL (CANCER CENTER ONLY)
BASO#: 0 10*3/uL (ref 0.0–0.2)
BASO%: 0.2 % (ref 0.0–2.0)
EOS ABS: 0.3 10*3/uL (ref 0.0–0.5)
EOS%: 7.5 % — ABNORMAL HIGH (ref 0.0–7.0)
HCT: 25.1 % — ABNORMAL LOW (ref 34.8–46.6)
HEMOGLOBIN: 5.9 g/dL — AB (ref 11.6–15.9)
LYMPH#: 1 10*3/uL (ref 0.9–3.3)
LYMPH%: 22.9 % (ref 14.0–48.0)
MCH: 14.9 pg — ABNORMAL LOW (ref 26.0–34.0)
MCHC: 23.5 g/dL — ABNORMAL LOW (ref 32.0–36.0)
MCV: 64 fL — ABNORMAL LOW (ref 81–101)
MONO#: 0.4 10*3/uL (ref 0.1–0.9)
MONO%: 10 % (ref 0.0–13.0)
NEUT#: 2.5 10*3/uL (ref 1.5–6.5)
NEUT%: 59.4 % (ref 39.6–80.0)
PLATELETS: 160 10*3/uL (ref 145–400)
RBC: 3.95 10*6/uL (ref 3.70–5.32)
RDW: 21.5 % — ABNORMAL HIGH (ref 11.1–15.7)
WBC: 4.3 10*3/uL (ref 3.9–10.0)

## 2013-08-01 MED ORDER — OXYCODONE HCL ER 10 MG PO T12A: 10.0000 mg | EXTENDED_RELEASE_TABLET | Freq: Two times a day (BID) | ORAL | Status: DC

## 2013-08-01 MED ORDER — OXYCODONE HCL 15 MG PO TABS: 15.0000 mg | ORAL_TABLET | Freq: Three times a day (TID) | ORAL | Status: DC | PRN

## 2013-08-01 MED ORDER — OXYCODONE HCL ER 20 MG PO T12A: 20.0000 mg | EXTENDED_RELEASE_TABLET | Freq: Three times a day (TID) | ORAL | Status: DC | PRN

## 2013-08-04 ENCOUNTER — Other Ambulatory Visit: Payer: Self-pay | Admitting: Cardiology

## 2013-08-15 ENCOUNTER — Encounter: Payer: Self-pay | Admitting: Hematology & Oncology

## 2013-08-15 ENCOUNTER — Ambulatory Visit (HOSPITAL_BASED_OUTPATIENT_CLINIC_OR_DEPARTMENT_OTHER): Payer: Medicare Other | Admitting: Hematology & Oncology

## 2013-08-15 ENCOUNTER — Other Ambulatory Visit (HOSPITAL_BASED_OUTPATIENT_CLINIC_OR_DEPARTMENT_OTHER): Payer: Medicare Other | Admitting: Lab

## 2013-08-15 VITALS — BP 125/56 | HR 74 | Temp 97.9°F | Resp 14 | Ht 65.0 in | Wt 165.0 lb

## 2013-08-15 DIAGNOSIS — D45 Polycythemia vera: Secondary | ICD-10-CM

## 2013-08-15 DIAGNOSIS — Z8673 Personal history of transient ischemic attack (TIA), and cerebral infarction without residual deficits: Secondary | ICD-10-CM | POA: Diagnosis not present

## 2013-08-15 LAB — COMPREHENSIVE METABOLIC PANEL
ALBUMIN: 4 g/dL (ref 3.5–5.2)
ALT: <8 U/L (ref 0–35)
AST: 12 U/L (ref 0–37)
Alkaline Phosphatase: 70 U/L (ref 39–117)
BUN: 6 mg/dL (ref 6–23)
CALCIUM: 8.8 mg/dL (ref 8.4–10.5)
CO2: 30 meq/L (ref 19–32)
Chloride: 103 mEq/L (ref 96–112)
Creatinine, Ser: 0.69 mg/dL (ref 0.50–1.10)
Glucose, Bld: 108 mg/dL — ABNORMAL HIGH (ref 70–99)
POTASSIUM: 3.7 meq/L (ref 3.5–5.3)
SODIUM: 140 meq/L (ref 135–145)
TOTAL PROTEIN: 6.7 g/dL (ref 6.0–8.3)
Total Bilirubin: 0.4 mg/dL (ref 0.2–1.2)

## 2013-08-15 LAB — CBC WITH DIFFERENTIAL (CANCER CENTER ONLY)
BASO#: 0 10*3/uL (ref 0.0–0.2)
BASO%: 0.6 % (ref 0.0–2.0)
EOS%: 9 % — ABNORMAL HIGH (ref 0.0–7.0)
Eosinophils Absolute: 0.3 10*3/uL (ref 0.0–0.5)
HCT: 25.1 % — ABNORMAL LOW (ref 34.8–46.6)
HEMOGLOBIN: 6 g/dL — AB (ref 11.6–15.9)
LYMPH#: 0.6 10*3/uL — ABNORMAL LOW (ref 0.9–3.3)
LYMPH%: 19.5 % (ref 14.0–48.0)
MCH: 15.5 pg — AB (ref 26.0–34.0)
MCHC: 23.9 g/dL — ABNORMAL LOW (ref 32.0–36.0)
MCV: 65 fL — AB (ref 81–101)
MONO#: 0.3 10*3/uL (ref 0.1–0.9)
MONO%: 9.6 % (ref 0.0–13.0)
NEUT%: 61.3 % (ref 39.6–80.0)
NEUTROS ABS: 2 10*3/uL (ref 1.5–6.5)
PLATELETS: 177 10*3/uL (ref 145–400)
RBC: 3.88 10*6/uL (ref 3.70–5.32)
RDW: 22.5 % — ABNORMAL HIGH (ref 11.1–15.7)
WBC: 3.2 10*3/uL — AB (ref 3.9–10.0)

## 2013-08-16 ENCOUNTER — Telehealth: Payer: Self-pay | Admitting: Hematology & Oncology

## 2013-08-16 LAB — FERRITIN CHCC: Ferritin: <4 ng/ml — ABNORMAL LOW (ref 9–269)

## 2013-08-16 LAB — IRON AND TIBC CHCC: TIBC: 493 ug/dL — AB (ref 236–444)

## 2013-08-16 NOTE — Progress Notes (Signed)
Hematology and Oncology Follow Up Visit  [name] [id number] 1945/10/12 68 y.o. 08/16/2013   Principle Diagnosis:  Polycythemia vera -- hyperviscosity variant. 2. 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  Current Therapy:   Phlebotomy to maintain hemoglobin below 6%.     Interim History:  [name] is back for followup. Is here every couple months. Unfortunately, she's just not doing too well. Her gynecologist at home took her off her Premarin and androgen. She has a had no libido. She just feels tired all the time. She was taken off her hormonal agents because of a history of her CVA.  I told is warned that her CVA was do to carotid artery stenosis. We have taken care of a hyperviscosity issues. She is not hypercoagulable from her Premarin or androgen. She just is having a hard time focusing. Her memory is a little bit worse. She just doesn't feel all that active. She's gained weight.  I told her and her husband to get her back on to the Premarin and androgen. She is on Plavix and aspirin. We are phlebotomizing her aggressively. I just told believe that she had that whatever is of CVA.  Otherwise, she is doing okay. She's had no nausea vomiting. She's had no cough. There is no chest wall pain. He's had no recurrent urine infections. She's had some slight leg swelling.  We have made her iron deficient from the phlebotomies.  Medications:   Allergies:  Allergies  Allergen Reactions  . Ethrane [Enflurane] Nausea And Vomiting  . Codeine Nausea Only  . Synthroid [Levothyroxine Sodium] Other (See Comments)    Not effective. Causes excessive sleepiness. Takes armour thyroid  . Calcium Channel Blockers Palpitations  . Penicillins Rash    Past Medical History, Surgical history, Social history, and Family History were reviewed and updated.  Review of Systems: As above  Physical Exam:  height is 5\' 5"  (1.651 m) and weight is 165 lb (74.844  kg). Her oral temperature is 97.9 F (36.6 C). Her blood pressure is 125/56 and her pulse is 74. Her respiration is 14.   Pale white female in no obvious distress. Head and neck exam shows alopecia. She is pale conjunctiva. She has no oral lesion. Neck is supple with no adenopathy. Lungs are clear. Cardiac exam regular in rhythm. She has a 1/6 systolic ejection murmur. Abdomen is soft. She has well-healed laparotomy scars in the right upper quadrant. There is no palpable liver or spleen tip. Back exam no tenderness over the spine ribs or hips. Extremities shows no clubbing cyanosis or edema. Neurological exam shows no focal neurological deficits.  Lab Results  Component Value Date   WBC 3.2* 08/15/2013   HGB 6.0* 08/15/2013   HCT 25.1* 08/15/2013   MCV 65* 08/15/2013   PLT 177 08/15/2013     Chemistry      Component Value Date/Time   NA 140 08/15/2013 1512   NA 137 06/20/2013 1324   K 3.7 08/15/2013 1512   K 3.5 06/20/2013 1324   CL 103 08/15/2013 1512   CL 95* 06/20/2013 1324   CO2 30 08/15/2013 1512   CO2 32 06/20/2013 1324   BUN 6 08/15/2013 1512   BUN 7 06/20/2013 1324   CREATININE 0.69 08/15/2013 1512   CREATININE 0.6 06/20/2013 1324      Component Value Date/Time   CALCIUM 8.8 08/15/2013 1512   CALCIUM 8.6 06/20/2013 1324   ALKPHOS 70 08/15/2013 1512  ALKPHOS 65 06/20/2013 1324   AST 12 08/15/2013 1512   AST 11 06/20/2013 1324   ALT <8 08/15/2013 1512   ALT 8* 06/20/2013 1324   BILITOT 0.4 08/15/2013 1512   BILITOT 0.60 06/20/2013 1324         Impression and Plan: [name] is 68 year old white female. To be 68 years old this weekend. She has polycythemia. She has a hyperviscosity type variant. Again we'll phlebotomize her aggressively.  We probably will phlebotomize her next week.  I think she'll feel better once she gets back on to her hormones.  I spent about 40 minutes with her and her husband. I listened to her about the problems she was having with been off hormone therapy. Her  quality of life clearly is being compromised. This is not acceptable for her. I understand this. I just don't think it will be a problem getting her back on to her hormone therapy. I think if she were hyper quite well from her hormones, she will had a cerebrovascular event a long time ago. This is event happened after she was incredibly sick with a necrotic gallbladder and was in the hospital. She was off her an aspirin at the time.   Sarah Napoleon, MD 5/21/20156:16 PM

## 2013-08-16 NOTE — Telephone Encounter (Signed)
Pt aware of 5-26 phlebotomy

## 2013-08-21 ENCOUNTER — Ambulatory Visit (HOSPITAL_BASED_OUTPATIENT_CLINIC_OR_DEPARTMENT_OTHER): Payer: Medicare Other

## 2013-08-21 VITALS — BP 117/62 | HR 56 | Temp 97.0°F | Resp 16

## 2013-08-21 DIAGNOSIS — D45 Polycythemia vera: Secondary | ICD-10-CM | POA: Diagnosis not present

## 2013-08-21 NOTE — Progress Notes (Signed)
[name] presents today for phlebotomy per MD orders. Phlebotomy procedure started at 1125 and ended at 1132. 500 grams removed. Patient observed for 30 minutes after procedure without any incident. Patient tolerated procedure well. Nourishments provided.  IV needle removed intact.

## 2013-08-21 NOTE — Patient Instructions (Signed)

## 2013-08-27 ENCOUNTER — Other Ambulatory Visit: Payer: Self-pay | Admitting: *Deleted

## 2013-08-27 DIAGNOSIS — M797 Fibromyalgia: Secondary | ICD-10-CM

## 2013-08-27 MED ORDER — OXYCODONE HCL ER 10 MG PO T12A: 10.0000 mg | EXTENDED_RELEASE_TABLET | Freq: Two times a day (BID) | ORAL | Status: DC

## 2013-08-27 MED ORDER — OXYCODONE HCL ER 20 MG PO T12A: 20.0000 mg | EXTENDED_RELEASE_TABLET | Freq: Three times a day (TID) | ORAL | Status: DC | PRN

## 2013-08-27 MED ORDER — OXYCODONE HCL 15 MG PO TABS
15.0000 mg | ORAL_TABLET | Freq: Three times a day (TID) | ORAL | Status: DC | PRN
Start: 2013-08-29 — End: 2013-09-25

## 2013-08-29 ENCOUNTER — Other Ambulatory Visit (HOSPITAL_BASED_OUTPATIENT_CLINIC_OR_DEPARTMENT_OTHER): Payer: Medicare Other | Admitting: Lab

## 2013-08-29 DIAGNOSIS — T148XXA Other injury of unspecified body region, initial encounter: Secondary | ICD-10-CM

## 2013-08-29 DIAGNOSIS — L24A9 Irritant contact dermatitis due friction or contact with other specified body fluids: Secondary | ICD-10-CM

## 2013-08-29 DIAGNOSIS — D45 Polycythemia vera: Secondary | ICD-10-CM

## 2013-08-29 DIAGNOSIS — R1011 Right upper quadrant pain: Secondary | ICD-10-CM

## 2013-08-29 DIAGNOSIS — K810 Acute cholecystitis: Secondary | ICD-10-CM

## 2013-08-29 LAB — CBC WITH DIFFERENTIAL (CANCER CENTER ONLY)
BASO#: 0 10*3/uL (ref 0.0–0.2)
BASO%: 0.5 % (ref 0.0–2.0)
EOS ABS: 0.2 10*3/uL (ref 0.0–0.5)
EOS%: 6 % (ref 0.0–7.0)
HEMATOCRIT: 22.6 % — AB (ref 34.8–46.6)
HGB: 5.4 g/dL — CL (ref 11.6–15.9)
LYMPH#: 0.8 10*3/uL — ABNORMAL LOW (ref 0.9–3.3)
LYMPH%: 18.8 % (ref 14.0–48.0)
MCH: 15.5 pg — ABNORMAL LOW (ref 26.0–34.0)
MCHC: 23.9 g/dL — ABNORMAL LOW (ref 32.0–36.0)
MCV: 65 fL — AB (ref 81–101)
MONO#: 0.5 10*3/uL (ref 0.1–0.9)
MONO%: 12.3 % (ref 0.0–13.0)
NEUT#: 2.5 10*3/uL (ref 1.5–6.5)
NEUT%: 62.4 % (ref 39.6–80.0)
PLATELETS: 157 10*3/uL (ref 145–400)
RBC: 3.48 10*6/uL — AB (ref 3.70–5.32)
RDW: 22.2 % — ABNORMAL HIGH (ref 11.1–15.7)
WBC: 4 10*3/uL (ref 3.9–10.0)

## 2013-09-03 ENCOUNTER — Telehealth: Payer: Self-pay | Admitting: Neurology

## 2013-09-03 ENCOUNTER — Encounter: Payer: Self-pay | Admitting: Neurology

## 2013-09-03 NOTE — Telephone Encounter (Signed)
Attempted to call patient regarding rescheduling 10/04/13 appointment per Dr. Clydene Fake schedule, no answer, phone kept ringing. Printed and mailed letter with new appointment time.

## 2013-09-12 ENCOUNTER — Ambulatory Visit (INDEPENDENT_AMBULATORY_CARE_PROVIDER_SITE_OTHER): Payer: Medicare Other | Admitting: Internal Medicine

## 2013-09-12 ENCOUNTER — Other Ambulatory Visit (HOSPITAL_BASED_OUTPATIENT_CLINIC_OR_DEPARTMENT_OTHER): Payer: Medicare Other | Admitting: Lab

## 2013-09-12 VITALS — BP 135/74 | HR 79 | Resp 16 | Ht 63.0 in | Wt 160.0 lb

## 2013-09-12 DIAGNOSIS — R6882 Decreased libido: Secondary | ICD-10-CM | POA: Diagnosis not present

## 2013-09-12 DIAGNOSIS — K810 Acute cholecystitis: Secondary | ICD-10-CM

## 2013-09-12 DIAGNOSIS — D45 Polycythemia vera: Secondary | ICD-10-CM

## 2013-09-12 DIAGNOSIS — T148XXA Other injury of unspecified body region, initial encounter: Secondary | ICD-10-CM

## 2013-09-12 DIAGNOSIS — R1011 Right upper quadrant pain: Secondary | ICD-10-CM

## 2013-09-12 DIAGNOSIS — L24A9 Irritant contact dermatitis due friction or contact with other specified body fluids: Secondary | ICD-10-CM

## 2013-09-12 LAB — CBC WITH DIFFERENTIAL (CANCER CENTER ONLY)
BASO#: 0 10*3/uL (ref 0.0–0.2)
BASO%: 0.3 % (ref 0.0–2.0)
EOS%: 3.2 % (ref 0.0–7.0)
Eosinophils Absolute: 0.2 10*3/uL (ref 0.0–0.5)
HEMATOCRIT: 25 % — AB (ref 34.8–46.6)
HGB: 6 g/dL — CL (ref 11.6–15.9)
LYMPH#: 0.9 10*3/uL (ref 0.9–3.3)
LYMPH%: 14.6 % (ref 14.0–48.0)
MCH: 15.6 pg — ABNORMAL LOW (ref 26.0–34.0)
MCHC: 24 g/dL — AB (ref 32.0–36.0)
MCV: 65 fL — ABNORMAL LOW (ref 81–101)
MONO#: 0.8 10*3/uL (ref 0.1–0.9)
MONO%: 12.2 % (ref 0.0–13.0)
NEUT#: 4.4 10*3/uL (ref 1.5–6.5)
NEUT%: 69.7 % (ref 39.6–80.0)
PLATELETS: 244 10*3/uL (ref 145–400)
RBC: 3.85 10*6/uL (ref 3.70–5.32)
RDW: 22 % — AB (ref 11.1–15.7)
WBC: 6.3 10*3/uL (ref 3.9–10.0)

## 2013-09-12 NOTE — Patient Instructions (Signed)
Call office if referral is indicated

## 2013-09-12 NOTE — Progress Notes (Signed)
Subjective:    Patient ID: [name], female    DOB: 12/13/1945, 68 y.o.   MRN: [id number]  HPI  Odessie is here first visit for consult regarding HT.  Her primary MD is Dr. Mare Ferrari who she wishes to keep.  She is here to day with her husband.    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    She is S/P hysterectomy.  Pt reports she has been on oral  Premarin since hysterectomy 1996 and on Androxy 10 mg for many years.  She uses Androxy primarily for low libido.  Her GYN has advised pt that it is necessary to come off her HT and she wishes a second opinion.  She likes testosterone as she gets fatigued and low libido when off her HT. She has a good relationship with her husband .  Spouse is using  viagra  For ED .     Allergies  Allergen Reactions  . Ethrane [Enflurane] Nausea And Vomiting  . Codeine Nausea Only  . Synthroid [Levothyroxine Sodium] Other (See Comments)    Not effective. Causes excessive sleepiness. Takes armour thyroid  . Calcium Channel Blockers Palpitations  . Penicillins Rash   Past Medical History  Diagnosis Date  . Fibromyalgia     chronic pain syndrome  . Postmenopausal state     on hormone replacement therapy  . Aortic valve sclerosis     by echocardiogram 12/04/2009  . Hyperlipidemia     "not since carotid OR" (12/28/2012)  . 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 GERD (gastroesophageal reflux disease)   . PAC (premature atrial contraction)   . Chronic constipation     takes Mineral Oil,Juice,Enema(prn),and Miralax(Prn) and Cascara nightly  . Insomnia     takes restoril nightly and Xanax  . Hypothyroidism   . Seasonal allergies     takes Allegra in am and Benadryl at night   . Occlusion and stenosis of carotid artery without mention of cerebral infarction 10/26/2012  . 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 Acute cholecystitis 10/07/2012    s/p pec drain due to recent CVA, pending chole 11/2012  . Difficult intubation     grade 3 airway  . PONV (postoperative nausea and vomiting)     "I vomit for 5 days straight w/certain RX w/thanes" (12/28/2012)  . Hypertension     "not since carotid OR" (12/28/2012)  . Heart murmur     "flow mumur" (12/28/2012)  . Exertional dyspnea   . Asthma     "wheeze occasionally; I don't have asthma" (12/28/2012)  . Anemia     "due to polycythemia vera" (12/28/2012)  . History of blood transfusion 09/2012; 12/28/2012  . Seizures     only seizure was 10/14/12 "after carotid endarterectomy";takes Keppra daily  . Stroke 09/2012    "after they put drain in my gallbladder"; residual is "haven't felt well enough to tell since stroke to tell; weak already; weaker when I'm tired" (12/28/2012)  . Chronic back pain   . Anxiety   . Polycythemia vera(238.4)     hyperviscosity variant   Past Surgical History  Procedure Laterality Date  . Endarterectomy Left 10/10/2012    Procedure: ENDARTERECTOMY CAROTID- LEFT NECK WITH GREATER SAPHENOUS VEIN PATCH  LEFT LEG;  Surgeon: Elam Dutch, MD;  Location: [facility] OR;  Service: Vascular;  Laterality: Left;  . Carotid endarterectomy Left 10-10-12  . Abdominal hysterectomy  1996  . Tonsillectomy      age 16  . Nasal septum surgery      late 70's  . Colonoscopy    . Insertion of drain  10/02/12    right low abdomen and draining pus  . Picc line placed      10/15/12  . Tubal ligation  ~ 1972  . Esophagogastroduodenoscopy N/A 01/02/2013    Procedure: ESOPHAGOGASTRODUODENOSCOPY (EGD);  Surgeon: Inda Castle, MD;  Location: [facility];  Service: Endoscopy;  Laterality: N/A;  . Cholecystectomy N/A 11/29/2012    Procedure: ATTEMPTED LAPAROSCOPIC CHOLECYSTECTOMY ;  Surgeon: Gwenyth Ober, MD;  Location: [facility];  Service: General;  Laterality: N/A;  . Cholecystectomy N/A 11/29/2012    Procedure: CHOLECYSTECTOMY, COMMON BILE DUCT EXPLORATION, T-TUBE PLACEMENT;  Surgeon: Gwenyth Ober, MD;  Location: [facility];  Service: General;  Laterality: N/A;  . Colonoscopy N/A 05/14/2013    Procedure: COLONOSCOPY;  Surgeon: Inda Castle, MD;  Location: [facility];  Service: Endoscopy;  Laterality: N/A;   History   Social History  . Marital Status: Married    Spouse Name: david    Number of Children: 0  . Years of Education: college   Occupational History  . retired    Social History Main Topics  . Smoking status: Never Smoker   . Smokeless tobacco: Never Used     Comment: never used tobacco  . Alcohol Use: No  . Drug Use: No  . Sexual Activity: Not Currently   Other Topics Concern  . Not on file   Social History Narrative  . No narrative on file   Family History  Problem Relation Age of Onset  . Heart attack Father   . Heart disease Father     Heart Disease before age 55  . Coronary artery disease Mother     had aortic valve replacement  . Heart disease Mother   . Heart attack Mother   . Hyperlipidemia Sister   . Hypertension Sister    Patient Active Problem List   Diagnosis Date Noted  . Aftercare following surgery of the circulatory system, [place] 05/10/2013  . Protein-calorie malnutrition, severe 12/29/2012  . Nausea and vomiting 12/28/2012  . Anemia 12/28/2012  . Dehydration 12/28/2012  . Hypotension 12/28/2012  . Status post cholecystectomy 12/28/2012  . Occlusion and stenosis of carotid artery with cerebral infarction 12/21/2012  . Seizures 12/21/2012  . Postop check 12/19/2012  . Occlusion and stenosis of carotid artery without mention of cerebral infarction 10/26/2012  . Cholecystostomy tube dysfunction 10/24/2012  . 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 Acute cholecystitis 10/07/2012  . CVA (cerebral infarction) 10/05/2012  . Septic shock 10/02/2012  .  Cholelithiases 09/30/2012  . Unspecified constipation 09/30/2012  . Bronchitis 03/08/2011  . Benign hypertensive heart disease without heart failure 09/28/2010  . Aortic valve sclerosis 09/28/2010  . Hypothyroidism 09/28/2010  . Polycythemia vera(238.4) 09/28/2010  . Postmenopausal state 09/28/2010  . Fibromyalgia 09/28/2010   Current Outpatient Prescriptions on File Prior to Visit  Medication Sig Dispense Refill  . albuterol-ipratropium (COMBIVENT) 18-103 MCG/ACT inhaler Inhale 2 puffs into the lungs every 6 (six) hours as needed for wheezing.      Marland Kitchen ALPRAZolam (XANAX) 0.5 MG tablet Take 0.5 mg by mouth every 6 (six) hours as needed.      Marland Kitchen  aspirin 325 MG tablet Take 325 mg by mouth daily after breakfast. "to thin  Blood"      . azithromycin (ZITHROMAX) 500 MG tablet Take by mouth daily. 2 tabs first 3 day the 1 till done (08-15-13  Pt's has completed dose, however husband does not wants this taken off med list)      . bisoprolol (ZEBETA) 10 MG tablet Take 1 tablet (10 mg total) by mouth daily.  60 tablet  11  . Cascara [place] 450 MG CAPS Take 900 mg by mouth at bedtime.      . cephALEXin (KEFLEX) 500 MG capsule Take 500 mg by mouth 4 (four) times daily. (08-15-13  Pt's husband does not want this med taken off list)      . cholecalciferol (VITAMIN D) 1000 UNITS tablet Take 3,000 Units by mouth daily after breakfast.       . ciprofloxacin (CIPRO) 500 MG/5ML (10%) suspension Take 500 mg by mouth 2 (two) times daily.      . clopidogrel (PLAVIX) 75 MG tablet TAKE 1 TABLET EVERY DAY  30 tablet  3  . COMBIVENT RESPIMAT 20-100 MCG/ACT AERS respimat INHALE 2 PUFFS 4 TIMES A DAY AS NEEDED  1 Inhaler  2  . cyanocobalamin (,VITAMIN B-12,) 1000 MCG/ML injection Inject 1,000 mcg into the muscle as needed (for energy).      . cyclobenzaprine (FLEXERIL) 10 MG tablet Take 10 mg by mouth every 6 (six) hours as needed.      . diphenhydrAMINE (BENADRYL) 25 MG tablet Take 50 mg by mouth every 8 (eight) hours as  needed. Patient takes 50 mg at bedtime each day.  Patient may take 50 mg three times a day as needed.      . docusate sodium (COLACE) 100 MG capsule Take 100 mg by mouth 3 (three) times daily with meals.       Marland Kitchen estrogens, conjugated, (PREMARIN) 0.625 MG tablet Take 0.625 mg by mouth daily after breakfast.       . famotidine (PEPCID) 20 MG tablet Take 1 tablet (20 mg total) by mouth 2 (two) times daily before a meal.  60 tablet  11  . fexofenadine (ALLEGRA) 180 MG tablet Take 180 mg by mouth daily after breakfast.       . fluconazole (DIFLUCAN) 100 MG tablet Take 100 mg by mouth daily.      . furosemide (LASIX) 40 MG tablet Take 80 mg by mouth daily after breakfast.       . Garlic Oil 6144 MG CAPS Take 1,000 mg by mouth daily after breakfast.      . hydrochlorothiazide (MICROZIDE) 12.5 MG capsule Take 1 capsule (12.5 mg total) by mouth daily as needed. If Systolic is GREATER than 315  30 capsule  1  . levETIRAcetam (KEPPRA) 500 MG tablet TAKE 1 TABLET BY MOUTH TWICE A DAY  60 tablet  6  . lidocaine-prilocaine (EMLA) cream Apply 1 application topically as needed (to numb the phlebotomy site).  30 g  3  . lisinopril (PRINIVIL,ZESTRIL) 10 MG tablet Take 10 mg by mouth 2 (two) times daily as needed. If Systolic is GREATER than 400      . magnesium oxide (MAG-OX) 400 MG tablet Take 1,200 mg by mouth daily.      . mineral oil liquid Take 60 mLs by mouth at bedtime. With Juice      . nitroGLYCERIN (NITROSTAT) 0.4 MG SL tablet Place 0.4 mg under the tongue every 5 (five) minutes as  needed for chest pain.      Marland Kitchen ondansetron (ZOFRAN) 8 MG tablet Take by mouth every 8 (eight) hours as needed for nausea or vomiting.      . OxyCODONE (OXYCONTIN) 10 mg T12A 12 hr tablet Take 1 tablet (10 mg total) by mouth every 12 (twelve) hours. BRAND NAME MEDICALLY NECESSARY.  Do not fill until 08/29/13  60 tablet  0  . OxyCODONE (OXYCONTIN) 20 mg T12A 12 hr tablet Take 1 tablet (20 mg total) by mouth every 8 (eight) hours as  needed. BRAND NAME MEDICALLY NECESSARY! Take at noon, 8 pm and 4 am.  Do not fill until 08/29/13  90 tablet  0  . oxyCODONE (ROXICODONE) 15 MG immediate release tablet Take 1 tablet (15 mg total) by mouth every 8 (eight) hours as needed (for breakthrough pain). BRAND NAME MEDICALLY NECESSARY. Do not fill until 08/29/13  75 tablet  0  . pantoprazole (PROTONIX) 40 MG tablet Take 1 tablet (40 mg total) by mouth daily.  30 tablet  0  . potassium chloride (MICRO-K) 10 MEQ CR capsule TAKE 1 CAPSULE 8 TIMES A DAY  240 capsule  9  . promethazine (PHENERGAN) 25 MG tablet Take 25 mg by mouth every 6 (six) hours as needed for nausea.      . Sennosides (SENNA LAX PO) Take by mouth 3 (three) times daily after meals.      . temazepam (RESTORIL) 30 MG capsule Take 1 capsule as needed for sleep take exactly as directed  30 capsule  5  . thyroid (ARMOUR) 60 MG tablet Take 150 mg by mouth daily after breakfast. Levothyroxine is not effective. Takes only armour thyroid.      Marland Kitchen triamcinolone cream (KENALOG) 0.1 % Apply 1 application topically 2 (two) times daily as needed (to skin).      . Valerian 100 MG CAPS Take 100 mg by mouth at bedtime as needed. Take for sleep       No current facility-administered medications on file prior to visit.      Review of Systems  See HPI Objective:   Physical Exam    Physical Exam  Nursing note and vitals reviewed.  Constitutional: She is oriented to person, place, and time. She appears well-developed and well-nourished.  HENT:  Head: Normocephalic and atraumatic.  Cardiovascular: Normal rate and regular rhythm. Exam reveals no gallop and no friction rub.  No murmur heard.  Pulmonary/Chest: Breath sounds normal. She has no wheezes. She has no rales.  Neurological: She is alert and oriented to person, place, and time.  Skin: Skin is warm and dry.  Psychiatric: She has a normal mood and affect. Her behavior is normal.     Assessment & Plan:  Low libido on  testosterone:  I advised both pt and her husband that despite a lack of scientific date with randomized trials regarding testosterone treatment in women,  recommendations from experts and specialty societies  all support short term use of testosterone in women.    Long term risks are still yet to be defined but retrospective data (primarily in men) do suggest and association with CV risk primarily MI.    Being cautious,   I agree it is time to stop  Her HT  I counseled pt that flibanserin is undergoing FDA approval process and this may be an option in the furture .  I also gave the name of several specialists at [facility] if she wishes to discuss with an academic center.  She wishes to think about this  I will be happy to see her again in consultation as needed    I spent 45 mins with this pt

## 2013-09-14 ENCOUNTER — Ambulatory Visit (HOSPITAL_BASED_OUTPATIENT_CLINIC_OR_DEPARTMENT_OTHER): Payer: Medicare Other

## 2013-09-14 VITALS — BP 106/68 | HR 62 | Temp 97.2°F | Resp 20

## 2013-09-14 DIAGNOSIS — D45 Polycythemia vera: Secondary | ICD-10-CM

## 2013-09-14 NOTE — Progress Notes (Signed)
[name] presents today for phlebotomy per MD orders. Phlebotomy procedure started at 1256 and ended at 1301. Approximately 500 mls removed. Patient observed for 30 minutes after procedure without any incident. Patient tolerated procedure well. IV needle removed intact.

## 2013-09-14 NOTE — Patient Instructions (Signed)
Therapeutic Phlebotomy Care After Refer to this sheet in the next few weeks. These instructions provide you with information on caring for yourself after your procedure. Your caregiver may also give you more specific instructions. Your treatment has been planned according to current medical practices, but problems sometimes occur. Call your caregiver if you have any problems or questions after your procedure. HOME CARE INSTRUCTIONS Most people can go back to their normal activities right away. Before you leave, be sure to ask if there is anything you should or should not do. In general, it would be wise to:  Keep the bandage dry. You can remove the bandage after about 5 hours.  Eat well-balanced meals for the next 24 hours.  Drink enough fluids to keep your urine clear or pale yellow.  Avoid drinking alcohol minimally until after eating.  Avoid smoking for at least 30 minutes after the procedure.  Avoid strenous physical activity or heavy lifting or pulling for about 5 hours after the procedure.  Athletes should avoid strenous exercise for 12 hours or more.  Change positions slowly for the remainder of the day to prevent lightheadedness or fainting.  If you feel lightheaded, lie down until the feeling subsides.  If you have bleeding from the needle insertion site, elevate your arm and press firmly on the site until the bleeding stops.  If bruising or bleeding appears under the skin, apply ice to the area for 15 to 20 minutes, 3 to 4 times per day. Put the ice in a plastic bag and place a towel between the bag of ice and your skin. Do this while you are awake for the first 24 hours. The ice packs can be stopped before 24 hours if the swelling goes away. If swelling persists after 24 hours, a warm, moist washcloth can be applied to the area for 15 to 20 minutes, 3 to 4 times per day. The warm, moist treatments can be stopped when the swelling goes away.  It is important to continue further  therapeutic phlebotomy as directed by your caregiver. SEEK MEDICAL CARE IF:  There is bleeding or fluid leaking from the needle insertion site.  The needle insertion site becomes swollen, red, or sore.  You feel lightheaded, dizzy or nauseated, and the feeling does not go away.  You notice new bruising at the needle insertion site.  You feel more weak or tired than normal.  You develop a fever. SEEK IMMEDIATE MEDICAL CARE IF:   There is increased bleeding, pain, or swelling from the needle insertion site.  You have severe nausea or vomiting.  You have chest pain.  You have trouble breathing. MAKE SURE YOU:  Understand these instructions.  Will watch your condition.  Will get help right away if you are not doing well or get worse. Document Released: 08/17/2010 Document Revised: 06/07/2011 Document Reviewed: 08/17/2010 ExitCare Patient Information 2015 [employer]. This information is not intended to replace advice given to you by your health care provider. Make sure you discuss any questions you have with your health care provider.  

## 2013-09-14 NOTE — Progress Notes (Signed)
[name] presents today for phlebotomy per MD orders. Phlebotomy procedure started at 1256 and ended at 1310. 500 grams removed. Patient observed for 30 minutes after procedure without any incident. Patient tolerated procedure well. IV needle removed intact.

## 2013-09-17 DIAGNOSIS — R6882 Decreased libido: Secondary | ICD-10-CM | POA: Insufficient documentation

## 2013-09-25 ENCOUNTER — Encounter: Payer: Self-pay | Admitting: Internal Medicine

## 2013-09-25 ENCOUNTER — Other Ambulatory Visit: Payer: Self-pay | Admitting: *Deleted

## 2013-09-25 DIAGNOSIS — M797 Fibromyalgia: Secondary | ICD-10-CM

## 2013-09-26 ENCOUNTER — Other Ambulatory Visit: Payer: Self-pay | Admitting: *Deleted

## 2013-09-26 ENCOUNTER — Other Ambulatory Visit (HOSPITAL_BASED_OUTPATIENT_CLINIC_OR_DEPARTMENT_OTHER): Payer: Medicare Other | Admitting: Lab

## 2013-09-26 DIAGNOSIS — D45 Polycythemia vera: Secondary | ICD-10-CM

## 2013-09-26 DIAGNOSIS — T148XXA Other injury of unspecified body region, initial encounter: Secondary | ICD-10-CM

## 2013-09-26 DIAGNOSIS — K810 Acute cholecystitis: Secondary | ICD-10-CM

## 2013-09-26 DIAGNOSIS — M797 Fibromyalgia: Secondary | ICD-10-CM

## 2013-09-26 DIAGNOSIS — R1011 Right upper quadrant pain: Secondary | ICD-10-CM

## 2013-09-26 DIAGNOSIS — L24A9 Irritant contact dermatitis due friction or contact with other specified body fluids: Secondary | ICD-10-CM

## 2013-09-26 LAB — CBC WITH DIFFERENTIAL (CANCER CENTER ONLY)
BASO#: 0 10*3/uL (ref 0.0–0.2)
BASO%: 0.4 % (ref 0.0–2.0)
EOS%: 5.7 % (ref 0.0–7.0)
Eosinophils Absolute: 0.3 10*3/uL (ref 0.0–0.5)
HCT: 23.8 % — ABNORMAL LOW (ref 34.8–46.6)
HGB: 5.8 g/dL — CL (ref 11.6–15.9)
LYMPH#: 0.8 10*3/uL — AB (ref 0.9–3.3)
LYMPH%: 14.5 % (ref 14.0–48.0)
MCH: 15.7 pg — ABNORMAL LOW (ref 26.0–34.0)
MCHC: 24.4 g/dL — AB (ref 32.0–36.0)
MCV: 64 fL — AB (ref 81–101)
MONO#: 0.6 10*3/uL (ref 0.1–0.9)
MONO%: 10.3 % (ref 0.0–13.0)
NEUT#: 3.9 10*3/uL (ref 1.5–6.5)
NEUT%: 69.1 % (ref 39.6–80.0)
Platelets: 193 10*3/uL (ref 145–400)
RBC: 3.7 10*6/uL (ref 3.70–5.32)
RDW: 21.6 % — AB (ref 11.1–15.7)
WBC: 5.7 10*3/uL (ref 3.9–10.0)

## 2013-09-26 MED ORDER — OXYCODONE HCL ER 20 MG PO T12A
EXTENDED_RELEASE_TABLET | ORAL | Status: DC
Start: 2013-09-26 — End: 2013-10-23

## 2013-09-26 MED ORDER — OXYCODONE HCL 15 MG PO TABS
ORAL_TABLET | ORAL | Status: DC
Start: 2013-09-26 — End: 2013-10-11

## 2013-09-26 MED ORDER — OXYCODONE HCL ER 20 MG PO T12A
EXTENDED_RELEASE_TABLET | ORAL | Status: DC
Start: 2013-09-26 — End: 2013-09-26

## 2013-09-26 MED ORDER — OXYCODONE HCL ER 10 MG PO T12A: EXTENDED_RELEASE_TABLET | ORAL | Status: DC

## 2013-09-26 MED ORDER — OXYCODONE HCL 15 MG PO TABS
ORAL_TABLET | ORAL | Status: DC
Start: 2013-09-26 — End: 2013-09-26

## 2013-09-26 NOTE — Telephone Encounter (Signed)
Reprinted patients prescriptions for Her pain medicine when they did not print out earlier this am.

## 2013-09-27 ENCOUNTER — Other Ambulatory Visit: Payer: Self-pay | Admitting: Cardiology

## 2013-09-27 ENCOUNTER — Other Ambulatory Visit: Payer: Self-pay | Admitting: Hematology & Oncology

## 2013-10-04 ENCOUNTER — Ambulatory Visit: Payer: Medicare Other | Admitting: Neurology

## 2013-10-11 ENCOUNTER — Other Ambulatory Visit (HOSPITAL_BASED_OUTPATIENT_CLINIC_OR_DEPARTMENT_OTHER): Payer: Medicare Other | Admitting: Lab

## 2013-10-11 ENCOUNTER — Ambulatory Visit (HOSPITAL_BASED_OUTPATIENT_CLINIC_OR_DEPARTMENT_OTHER): Payer: Medicare Other | Admitting: Hematology & Oncology

## 2013-10-11 VITALS — BP 135/54 | HR 85 | Temp 97.2°F | Resp 16 | Ht 63.0 in | Wt 159.0 lb

## 2013-10-11 DIAGNOSIS — M797 Fibromyalgia: Secondary | ICD-10-CM

## 2013-10-11 DIAGNOSIS — D509 Iron deficiency anemia, unspecified: Secondary | ICD-10-CM | POA: Diagnosis not present

## 2013-10-11 DIAGNOSIS — D45 Polycythemia vera: Secondary | ICD-10-CM | POA: Diagnosis not present

## 2013-10-11 LAB — CBC WITH DIFFERENTIAL (CANCER CENTER ONLY)
BASO#: 0 10*3/uL (ref 0.0–0.2)
BASO%: 0.2 % (ref 0.0–2.0)
EOS%: 1.8 % (ref 0.0–7.0)
Eosinophils Absolute: 0.1 10*3/uL (ref 0.0–0.5)
HCT: 26.4 % — ABNORMAL LOW (ref 34.8–46.6)
HGB: 6.3 g/dL — CL (ref 11.6–15.9)
LYMPH#: 0.5 10*3/uL — AB (ref 0.9–3.3)
LYMPH%: 8.1 % — ABNORMAL LOW (ref 14.0–48.0)
MCH: 15.5 pg — ABNORMAL LOW (ref 26.0–34.0)
MCHC: 23.9 g/dL — ABNORMAL LOW (ref 32.0–36.0)
MCV: 65 fL — ABNORMAL LOW (ref 81–101)
MONO#: 0.5 10*3/uL (ref 0.1–0.9)
MONO%: 8.5 % (ref 0.0–13.0)
NEUT%: 81.4 % — ABNORMAL HIGH (ref 39.6–80.0)
NEUTROS ABS: 5.1 10*3/uL (ref 1.5–6.5)
PLATELETS: 214 10*3/uL (ref 145–400)
RBC: 4.06 10*6/uL (ref 3.70–5.32)
RDW: 22.1 % — AB (ref 11.1–15.7)
WBC: 6.2 10*3/uL (ref 3.9–10.0)

## 2013-10-11 LAB — CMP (CANCER CENTER ONLY)
ALBUMIN: 3.7 g/dL (ref 3.3–5.5)
ALK PHOS: 67 U/L (ref 26–84)
ALT(SGPT): 12 U/L (ref 10–47)
AST: 19 U/L (ref 11–38)
BUN: 8 mg/dL (ref 7–22)
CO2: 31 meq/L (ref 18–33)
Calcium: 8.3 mg/dL (ref 8.0–10.3)
Chloride: 94 mEq/L — ABNORMAL LOW (ref 98–108)
Creat: 0.8 mg/dl (ref 0.6–1.2)
Glucose, Bld: 114 mg/dL (ref 73–118)
Potassium: 3.4 mEq/L (ref 3.3–4.7)
Sodium: 143 mEq/L (ref 128–145)
Total Bilirubin: 0.6 mg/dl (ref 0.20–1.60)
Total Protein: 7.5 g/dL (ref 6.4–8.1)

## 2013-10-11 MED ORDER — OXYCODONE HCL ER 10 MG PO T12A: EXTENDED_RELEASE_TABLET | ORAL | Status: DC

## 2013-10-11 MED ORDER — OXYCODONE HCL 15 MG PO TABS: ORAL_TABLET | ORAL | Status: DC

## 2013-10-11 NOTE — Progress Notes (Signed)
Hematology and Oncology Follow Up Visit  SULLIVAN BLASING [id number] 25-Jun-1945 68 y.o. 10/11/2013   Principle Diagnosis:  Polycythemia vera -- hyperviscosity variant. 2. 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  Current Therapy:    Phlebotomy to maintain hemoglobin below 6.0     Interim History:  Ms.  Lightle is back for followup. She is doing very well. She still is not on hormonal therapy. Her gynecologist does not feel comfortable putting her on this.  She's had no problem headaches. She's had no problems with cough or shortness of breath.  We probably, last phlebotomized her about 4-6 weeks ago. She really is iron deficient.  She's had no problems with cerebrovascular issues. She's had no headache.  She's had no skin problems. There has been no change in bowel or bladder habits.  Medications:Allergies:  Allergies  Allergen Reactions  . Ethrane [Enflurane] Nausea And Vomiting  . Codeine Nausea Only  . Synthroid [Levothyroxine Sodium] Other (See Comments)    Not effective. Causes excessive sleepiness. Takes armour thyroid  . Calcium Channel Blockers Palpitations  . Penicillins Rash    Past Medical History, Surgical history, Social history, and Family History were reviewed and updated.  Review of Systems: As above  Physical Exam:  height is 5\' 3"  (1.6 m) and weight is 159 lb (72.122 kg). Her oral temperature is 97.2 F (36.2 C). Her blood pressure is 135/54 and her pulse is 85. Her respiration is 16.   Well-developed and well-nourished white female in no obvious distress. Head and neck exam shows pale conjunctiva. There is no oral lesions. There is no adenopathy in the neck. Lungs are clear bilaterally. Cardiac exam regular in rhythm with a 1/6 systolic ejection murmur. Abdomen is soft. Has good bowel sounds. There is no palpable liver or spleen tip appreciative well-healed laparotomy scars. Back exam no tenderness over the spine ribs or  hips. Extremities shows no clubbing cyanosis or edema. Neurological exam shows no focal neurological deficits. Skin exam no rashes, ecchymoses or petechia.  Lab Results  Component Value Date   WBC 6.2 10/11/2013   HGB 6.3* 10/11/2013   HCT 26.4* 10/11/2013   MCV 65* 10/11/2013   PLT 214 10/11/2013     Chemistry      Component Value Date/Time   NA 143 10/11/2013 1345   NA 140 08/15/2013 1512   K 3.4 10/11/2013 1345   K 3.7 08/15/2013 1512   CL 94* 10/11/2013 1345   CL 103 08/15/2013 1512   CO2 31 10/11/2013 1345   CO2 30 08/15/2013 1512   BUN 8 10/11/2013 1345   BUN 6 08/15/2013 1512   CREATININE 0.8 10/11/2013 1345   CREATININE 0.69 08/15/2013 1512      Component Value Date/Time   CALCIUM 8.3 10/11/2013 1345   CALCIUM 8.8 08/15/2013 1512   ALKPHOS 67 10/11/2013 1345   ALKPHOS 70 08/15/2013 1512   AST 19 10/11/2013 1345   AST 12 08/15/2013 1512   ALT 12 10/11/2013 1345   ALT <8 08/15/2013 1512   BILITOT 0.60 10/11/2013 1345   BILITOT 0.4 08/15/2013 1512         Impression and Plan: Ms. Kiesler is 68 year old female with polycythemia vera. She is hyperviscous.  We will go ahead cerebral the phlebotomy next week. This will improve her overall quality of life.. Again, she is very much iron deficient.  She does have any kind of cardiac issues right now. She sees her cardiologist in  about a month.  Unfortunately , it does not look like she ever will go back onto hormonal replacement. Hopefully, her libido will be improved by the new medication coming out later on this summer for within.  We will continue to follow her blood work and phlebotomize her as indicated. Recheck her blood counts every 2 weeks. I will see her back myself in 2 months.   Volanda Napoleon, MD 7/16/20153:27 PM

## 2013-10-12 LAB — IRON AND TIBC CHCC
%SAT: UNDETERMINED % (ref 21–57)
Iron: <11 ug/dL — ABNORMAL LOW (ref 41–142)
TIBC: 544 ug/dL — ABNORMAL HIGH (ref 236–444)

## 2013-10-12 LAB — FERRITIN CHCC: Ferritin: <4 ng/ml — ABNORMAL LOW (ref 9–269)

## 2013-10-15 ENCOUNTER — Ambulatory Visit (HOSPITAL_BASED_OUTPATIENT_CLINIC_OR_DEPARTMENT_OTHER): Payer: Medicare Other

## 2013-10-15 VITALS — BP 122/70 | HR 73 | Temp 97.6°F | Resp 16

## 2013-10-15 DIAGNOSIS — D45 Polycythemia vera: Secondary | ICD-10-CM

## 2013-10-15 NOTE — Progress Notes (Signed)
[name] presents today for phlebotomy per MD orders. Phlebotomy procedure started at 1125 and ended at 1131 500 grams removed. Patient observed for 30 minutes after procedure without any incident. Patient tolerated procedure well. IV needle removed intact.

## 2013-10-23 ENCOUNTER — Other Ambulatory Visit: Payer: Self-pay | Admitting: Nurse Practitioner

## 2013-10-23 DIAGNOSIS — D45 Polycythemia vera: Secondary | ICD-10-CM

## 2013-10-23 DIAGNOSIS — M797 Fibromyalgia: Secondary | ICD-10-CM

## 2013-10-23 MED ORDER — OXYCODONE HCL 15 MG PO TABS: ORAL_TABLET | ORAL | Status: DC

## 2013-10-23 MED ORDER — OXYCODONE HCL ER 10 MG PO T12A: EXTENDED_RELEASE_TABLET | ORAL | Status: DC

## 2013-10-23 MED ORDER — OXYCODONE HCL ER 20 MG PO T12A: EXTENDED_RELEASE_TABLET | ORAL | Status: DC

## 2013-10-24 ENCOUNTER — Other Ambulatory Visit: Payer: Medicare Other | Admitting: Lab

## 2013-10-24 ENCOUNTER — Telehealth: Payer: Self-pay | Admitting: Hematology & Oncology

## 2013-10-24 NOTE — Telephone Encounter (Signed)
Patient called cx 10/24/13 lab apt due to car trouble.  Patient stated she would call back to resch

## 2013-10-25 ENCOUNTER — Other Ambulatory Visit: Payer: Self-pay

## 2013-10-25 MED ORDER — HYDROCHLOROTHIAZIDE 12.5 MG PO CAPS: 12.5000 mg | ORAL_CAPSULE | Freq: Every day | ORAL | Status: DC | PRN

## 2013-10-26 ENCOUNTER — Other Ambulatory Visit: Payer: Self-pay

## 2013-10-26 MED ORDER — HYDROCHLOROTHIAZIDE 12.5 MG PO CAPS: 12.5000 mg | ORAL_CAPSULE | Freq: Every day | ORAL | Status: DC | PRN

## 2013-10-26 MED ORDER — BISOPROLOL FUMARATE 10 MG PO TABS: 10.0000 mg | ORAL_TABLET | Freq: Every day | ORAL | Status: DC

## 2013-10-29 ENCOUNTER — Telehealth: Payer: Self-pay | Admitting: Hematology & Oncology

## 2013-10-29 NOTE — Telephone Encounter (Signed)
Patient called and resch 7/291/5 cancelled apt for 10/30/13

## 2013-10-30 ENCOUNTER — Other Ambulatory Visit (HOSPITAL_BASED_OUTPATIENT_CLINIC_OR_DEPARTMENT_OTHER): Payer: Medicare Other | Admitting: Lab

## 2013-10-30 DIAGNOSIS — K810 Acute cholecystitis: Secondary | ICD-10-CM

## 2013-10-30 DIAGNOSIS — T148XXA Other injury of unspecified body region, initial encounter: Secondary | ICD-10-CM

## 2013-10-30 DIAGNOSIS — H2510 Age-related nuclear cataract, unspecified eye: Secondary | ICD-10-CM | POA: Diagnosis not present

## 2013-10-30 DIAGNOSIS — D45 Polycythemia vera: Secondary | ICD-10-CM | POA: Diagnosis not present

## 2013-10-30 DIAGNOSIS — R1011 Right upper quadrant pain: Secondary | ICD-10-CM

## 2013-10-30 DIAGNOSIS — H5210 Myopia, unspecified eye: Secondary | ICD-10-CM | POA: Diagnosis not present

## 2013-10-30 DIAGNOSIS — L24A9 Irritant contact dermatitis due friction or contact with other specified body fluids: Secondary | ICD-10-CM

## 2013-10-30 DIAGNOSIS — H524 Presbyopia: Secondary | ICD-10-CM | POA: Diagnosis not present

## 2013-10-30 DIAGNOSIS — H35349 Macular cyst, hole, or pseudohole, unspecified eye: Secondary | ICD-10-CM | POA: Diagnosis not present

## 2013-10-30 LAB — CBC WITH DIFFERENTIAL (CANCER CENTER ONLY)
BASO#: 0 10*3/uL (ref 0.0–0.2)
BASO%: 0.7 % (ref 0.0–2.0)
EOS ABS: 0.2 10*3/uL (ref 0.0–0.5)
EOS%: 5.2 % (ref 0.0–7.0)
HEMATOCRIT: 23.6 % — AB (ref 34.8–46.6)
HGB: 5.5 g/dL — CL (ref 11.6–15.9)
LYMPH#: 0.7 10*3/uL — ABNORMAL LOW (ref 0.9–3.3)
LYMPH%: 15.7 % (ref 14.0–48.0)
MCH: 15.4 pg — AB (ref 26.0–34.0)
MCHC: 23.3 g/dL — ABNORMAL LOW (ref 32.0–36.0)
MCV: 66 fL — AB (ref 81–101)
MONO#: 0.6 10*3/uL (ref 0.1–0.9)
MONO%: 12.8 % (ref 0.0–13.0)
NEUT#: 2.9 10*3/uL (ref 1.5–6.5)
NEUT%: 65.6 % (ref 39.6–80.0)
PLATELETS: 185 10*3/uL (ref 145–400)
RBC: 3.57 10*6/uL — AB (ref 3.70–5.32)
RDW: 22 % — ABNORMAL HIGH (ref 11.1–15.7)
WBC: 4.5 10*3/uL (ref 3.9–10.0)

## 2013-11-02 ENCOUNTER — Other Ambulatory Visit: Payer: Self-pay | Admitting: *Deleted

## 2013-11-02 DIAGNOSIS — N3000 Acute cystitis without hematuria: Secondary | ICD-10-CM

## 2013-11-02 MED ORDER — CIPROFLOXACIN HCL 500 MG PO TABS: 500.0000 mg | ORAL_TABLET | Freq: Two times a day (BID) | ORAL | Status: DC

## 2013-11-06 ENCOUNTER — Encounter: Payer: Self-pay | Admitting: Family

## 2013-11-07 ENCOUNTER — Other Ambulatory Visit (HOSPITAL_BASED_OUTPATIENT_CLINIC_OR_DEPARTMENT_OTHER): Payer: Medicare Other | Admitting: Lab

## 2013-11-07 ENCOUNTER — Ambulatory Visit (INDEPENDENT_AMBULATORY_CARE_PROVIDER_SITE_OTHER): Payer: Medicare Other | Admitting: Family

## 2013-11-07 ENCOUNTER — Encounter: Payer: Self-pay | Admitting: Family

## 2013-11-07 ENCOUNTER — Ambulatory Visit (HOSPITAL_COMMUNITY)
Admission: RE | Admit: 2013-11-07 | Discharge: 2013-11-07 | Disposition: A | Discharge status: Home / Self Care | Payer: Medicare Other | Source: Ambulatory Visit | Attending: Family | Admitting: Family

## 2013-11-07 VITALS — BP 115/73 | HR 71 | Resp 16 | Ht 63.0 in | Wt 159.0 lb

## 2013-11-07 DIAGNOSIS — D45 Polycythemia vera: Secondary | ICD-10-CM | POA: Diagnosis not present

## 2013-11-07 DIAGNOSIS — Z48812 Encounter for surgical aftercare following surgery on the circulatory system: Secondary | ICD-10-CM | POA: Insufficient documentation

## 2013-11-07 DIAGNOSIS — I6529 Occlusion and stenosis of unspecified carotid artery: Secondary | ICD-10-CM

## 2013-11-07 DIAGNOSIS — K810 Acute cholecystitis: Secondary | ICD-10-CM

## 2013-11-07 DIAGNOSIS — T148XXA Other injury of unspecified body region, initial encounter: Secondary | ICD-10-CM

## 2013-11-07 DIAGNOSIS — R1011 Right upper quadrant pain: Secondary | ICD-10-CM

## 2013-11-07 DIAGNOSIS — L24A9 Irritant contact dermatitis due friction or contact with other specified body fluids: Secondary | ICD-10-CM

## 2013-11-07 LAB — CBC WITH DIFFERENTIAL (CANCER CENTER ONLY)
BASO#: 0 10*3/uL (ref 0.0–0.2)
BASO%: 0.4 % (ref 0.0–2.0)
EOS%: 5.2 % (ref 0.0–7.0)
Eosinophils Absolute: 0.3 10*3/uL (ref 0.0–0.5)
HCT: 26.5 % — ABNORMAL LOW (ref 34.8–46.6)
HGB: 6.3 g/dL — CL (ref 11.6–15.9)
LYMPH#: 1.1 10*3/uL (ref 0.9–3.3)
LYMPH%: 21.7 % (ref 14.0–48.0)
MCH: 15.4 pg — AB (ref 26.0–34.0)
MCHC: 23.8 g/dL — ABNORMAL LOW (ref 32.0–36.0)
MCV: 65 fL — AB (ref 81–101)
MONO#: 0.6 10*3/uL (ref 0.1–0.9)
MONO%: 12.7 % (ref 0.0–13.0)
NEUT#: 3 10*3/uL (ref 1.5–6.5)
NEUT%: 60 % (ref 39.6–80.0)
Platelets: 196 10*3/uL (ref 145–400)
RBC: 4.09 10*6/uL (ref 3.70–5.32)
RDW: 21.8 % — AB (ref 11.1–15.7)
WBC: 5 10*3/uL (ref 3.9–10.0)

## 2013-11-07 NOTE — Addendum Note (Signed)
Addended by: Mena Goes on: 11/07/2013 05:28 PM   Modules accepted: Orders

## 2013-11-07 NOTE — Progress Notes (Signed)
Established Carotid Patient   History of Present Illness  [name] is a 68 y.o. female patient of Dr. Oneida Alar who returns for followup today after left carotid endarterectomy October 10, 2012. This was done with a saphenous vein patch. She had a seizure several days after discharge from the hospital. She was readmitted [facility] and placed on Keppra; she is concerned about the cost of Keppra. She has recovered since then with no further seizures. She still has some weakness and clumsiness of her right hand.  She states her memory has improved since the left CEA.   Had a stroke in 2001, had a clot that went to left eye and damaged the retina, has some peripheral vision in left eye.  Had a second stroke July 7 or 9, 2014, then had the left CEA., she has had no further stroke or TIA activity. She states that she feels stronger than at her last visit. She states that her blood pressure gets high at times, she takes her blood pressure medication when her pressure is high, as directed by her cardiologist pt reports.  Has mild right sided weakness since the second stroke.  She has no expressive aphasia.  Pt reports New Medical or Surgical History: was taken off her HRT, states she feels worse since then  Pt Diabetic: No  Pt smoker: non-smoker  Pt meds include:  Statin : Yes  ASA: Yes  Other anticoagulants/antiplatelets: Plavix   Past Medical History  Diagnosis Date  . Fibromyalgia     chronic pain syndrome  . Postmenopausal state     on hormone replacement therapy  . Aortic valve sclerosis     by echocardiogram 12/04/2009  . Hyperlipidemia     "not since carotid OR" (12/28/2012)  . 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 GERD (gastroesophageal reflux disease)   . PAC (premature atrial contraction)   . Chronic constipation     takes Mineral Oil,Juice,Enema(prn),and Miralax(Prn) and Cascara nightly  . Insomnia     takes restoril nightly  and Xanax  . Hypothyroidism   . Seasonal allergies     takes Allegra in am and Benadryl at night  . Occlusion and stenosis of carotid artery without mention of cerebral infarction 10/26/2012  . 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 Acute cholecystitis 10/07/2012    s/p pec drain due to recent CVA, pending chole 11/2012  . Difficult intubation     grade 3 airway  . PONV (postoperative nausea and vomiting)     "I vomit for 5 days straight w/certain RX w/thanes" (12/28/2012)  . Hypertension     "not since carotid OR" (12/28/2012)  . Heart murmur     "flow mumur" (12/28/2012)  . Exertional dyspnea   . Asthma     "wheeze occasionally; I don't have asthma" (12/28/2012)  . Anemia     "due to polycythemia vera" (12/28/2012)  . History of blood transfusion 09/2012; 12/28/2012  . Seizures     only seizure was 10/14/12 "after carotid endarterectomy";takes Keppra daily  . Stroke 09/2012    "after they put drain in my gallbladder"; residual is "haven't felt well enough to tell since stroke to tell; weak already; weaker when I'm tired" (12/28/2012)  . Chronic back pain   . Anxiety   . 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 Smoking status: Never Smoker   .  Smokeless tobacco: Never Used     Comment: never used tobacco  . Alcohol Use: No    Family History Family History  Problem Relation Age of Onset  . Heart attack Father   . Heart disease Father     Heart Disease before age 58  . Coronary artery disease Mother     had aortic valve replacement  . Heart disease Mother   . Heart attack Mother   . Hyperlipidemia Sister   . Hypertension Sister     Surgical History Past Surgical History  Procedure Laterality Date  . Endarterectomy Left 10/10/2012    Procedure: ENDARTERECTOMY CAROTID- LEFT NECK WITH GREATER SAPHENOUS VEIN PATCH LEFT LEG;  Surgeon: Elam Dutch, MD;  Location: [place];  Service: Vascular;  Laterality:  Left;  . Carotid endarterectomy Left 10-10-12  . Abdominal hysterectomy  1996  . Tonsillectomy      age 77  . Nasal septum surgery      late 70's  . Colonoscopy    . Insertion of drain  10/02/12    right low abdomen and draining pus  . Picc line placed      10/15/12  . Tubal ligation  ~ 1972  . Esophagogastroduodenoscopy N/A 01/02/2013    Procedure: ESOPHAGOGASTRODUODENOSCOPY (EGD);  Surgeon: Inda Castle, MD;  Location: [place];  Service: Endoscopy;  Laterality: N/A;  . Cholecystectomy N/A 11/29/2012    Procedure: ATTEMPTED LAPAROSCOPIC CHOLECYSTECTOMY ;  Surgeon: Gwenyth Ober, MD;  Location: [place];  Service: General;  Laterality: N/A;  . Cholecystectomy N/A 11/29/2012    Procedure: CHOLECYSTECTOMY, COMMON BILE DUCT EXPLORATION, T-TUBE PLACEMENT;  Surgeon: Gwenyth Ober, MD;  Location: [facility];  Service: General;  Laterality: N/A;  . Colonoscopy N/A 05/14/2013    Procedure: COLONOSCOPY;  Surgeon: Inda Castle, MD;  Location: [facility];  Service: Endoscopy;  Laterality: N/A;    Allergies  Allergen Reactions  . Ethrane [Enflurane] Nausea And Vomiting  . Codeine Nausea Only  . Synthroid [Levothyroxine Sodium] Other (See Comments)    Not effective. Causes excessive sleepiness. Takes armour thyroid  . Calcium Channel Blockers Palpitations  . Penicillins Rash     Review of Systems : See HPI for pertinent positives and negatives.  Physical Examination  Filed Vitals:   11/07/13 1240 11/07/13 1243  BP: 115/63 115/73  Pulse: 70 71  Resp:  16  Height:  5\' 3"  (1.6 m)  Weight:  159 lb (72.122 kg)  SpO2:  100%   Body mass index is 28.17 kg/(m^2).  General: WDWN female in NAD  GAIT: normal  Eyes: PERRLA  Pulmonary: Non-labored, CTAB, Negative Rales, Negative rhonchi, & Negative wheezing. Decreased air movement in right base.  Cardiac: regular Rhythm , positive detected murmur.   VASCULAR EXAM  Carotid Bruits  Left  Right    positive Negative   Aorta is not palpable.   Radial pulses are 1+ palpable and equal.   LE Pulses  LEFT  RIGHT   POPLITEAL  not palpable  not palpable   POSTERIOR TIBIAL  palpable  palpable   DORSALIS PEDIS  ANTERIOR TIBIAL  palpable  palpable    Gastrointestinal: no tenderness, negative masses palpated.  Musculoskeletal: Negative muscle atrophy/wasting. M/S 4/5 throughout, Extremities without ischemic changes.  Neurologic: A&O X 3; Appropriate Affect ; SENSATION ;normal;  Speech is normal  CN 2-12 intact, Pain and light touch intact in extremities, Motor exam as listed above.   Non-Invasive Vascular Imaging CAROTID DUPLEX 11/07/2013  CEREBROVASCULAR DUPLEX EVALUATION    INDICATION: Carotid artery disease     PREVIOUS INTERVENTION(S): Left carotid endarterectomy 10/10/2012.    DUPLEX EXAM:     RIGHT  LEFT  Peak Systolic Velocities (cm/s) End Diastolic Velocities (cm/s) Plaque LOCATION Peak Systolic Velocities (cm/s) End Diastolic Velocities (cm/s) Plaque  112 28  CCA PROXIMAL 145 33   113 28  CCA MID 106 28   94 32  CCA DISTAL 109 34   102 15  ECA 88 15   69 16  ICA PROXIMAL 54 17   138 48 None ICA MID 127 46   138 49  ICA DISTAL 130 48     1.22 ICA / CCA Ratio (PSV)   Antegrade  Vertebral Flow Antegrade    Brachial Systolic Pressure (mmHg)    Brachial Artery Waveforms     Plaque Morphology:  HM = Homogeneous, HT = Heterogeneous, CP = Calcific Plaque, SP = Smooth Plaque, IP = Irregular Plaque     ADDITIONAL FINDINGS:     IMPRESSION: The right internal carotid artery appears widely patent with velocities suggestive of a 40-59% stenosis in the mid segment; however, this appears to be due to a change in vessel diameter, no plaque observed. Patent left carotid endarterectomy site with no evidence of hyperplasia; however, velocities suggest a 40-59% stenosis.     Compared to the previous exam:  Velocities of the R ICA are not as elevated on today's exam. Velocities of the L ICA are increased compared to the last  exam.     Assessment: TAKISHA PELLE is a 68 y.o. female who is s/p left carotid endarterectomy October 10, 2012.  Carotid Duplex today demonstrates the right internal carotid artery appears widely patent with velocities suggestive of a 40-59% stenosis in the mid segment; however, this appears to be due to a change in vessel diameter, no plaque observed. Patent left carotid endarterectomy site with no evidence of hyperplasia; however, velocities suggest a 40-59% stenosis.  Velocities of the R ICA are not as elevated on today's exam. Velocities of the L ICA are increased compared to the last exam.   Plan: Follow-up in 1 year with Carotid Duplex scan.   I discussed in depth with the patient the nature of atherosclerosis, and emphasized the importance of maximal medical management including strict control of blood pressure, blood glucose, and lipid levels, obtaining regular exercise, and continued cessation of smoking.  The patient is aware that without maximal medical management the underlying atherosclerotic disease process will progress, limiting the benefit of any interventions. The patient was given information about stroke prevention and what symptoms should prompt the patient to seek immediate medical care. Thank you for allowing [place] to participate in this patient's care.  Clemon Chambers, RN, MSN, FNP-C [facility] Office: [phone]  Clinic Physician: Scot Dock  11/07/2013 11:26 AM

## 2013-11-12 ENCOUNTER — Ambulatory Visit (INDEPENDENT_AMBULATORY_CARE_PROVIDER_SITE_OTHER): Payer: Medicare Other | Admitting: Cardiology

## 2013-11-12 ENCOUNTER — Encounter: Payer: Self-pay | Admitting: Cardiology

## 2013-11-12 VITALS — BP 120/78 | HR 86 | Ht 63.0 in | Wt 160.8 lb

## 2013-11-12 DIAGNOSIS — R1011 Right upper quadrant pain: Secondary | ICD-10-CM | POA: Diagnosis not present

## 2013-11-12 DIAGNOSIS — D45 Polycythemia vera: Secondary | ICD-10-CM

## 2013-11-12 DIAGNOSIS — I6529 Occlusion and stenosis of unspecified carotid artery: Secondary | ICD-10-CM | POA: Diagnosis not present

## 2013-11-12 DIAGNOSIS — N3000 Acute cystitis without hematuria: Secondary | ICD-10-CM | POA: Diagnosis not present

## 2013-11-12 DIAGNOSIS — E038 Other specified hypothyroidism: Secondary | ICD-10-CM

## 2013-11-12 DIAGNOSIS — I119 Hypertensive heart disease without heart failure: Secondary | ICD-10-CM | POA: Diagnosis not present

## 2013-11-12 DIAGNOSIS — G8929 Other chronic pain: Secondary | ICD-10-CM

## 2013-11-12 DIAGNOSIS — K810 Acute cholecystitis: Secondary | ICD-10-CM

## 2013-11-12 MED ORDER — CIPROFLOXACIN HCL 500 MG PO TABS: 500.0000 mg | ORAL_TABLET | Freq: Two times a day (BID) | ORAL | Status: DC

## 2013-11-12 NOTE — Assessment & Plan Note (Signed)
The patient is not having any chest pain or increased shortness of breath.

## 2013-11-12 NOTE — Progress Notes (Signed)
Fabio Pierce Date of Birth:  December 06, 1945 [facility] [address] [place], [place]  [address] [phone]        Fax   [phone]   History of Present Illness: This pleasant 68 year old Caucasian female is seen for a four month followup office visit. She has a history of polycythemia vera and a history of hypothyroidism. She has a history of aortic valve sclerosis and essential hypertension. She has atypical chest pain and does not have any history of ischemic heart disease. She has a past history of PVCs.  The patient is status post cholecystectomy. Dr. Hulen Skains is her surgeon. 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 She has had a recent left carotid endarterectomy. She had carotid duplex ultrasound on 11/07/13 which showed 40-59% flow reduction in both arteries. She was readmitted to [facility] with some type of atypical focal seizure and is now on Keppra. She does not have any history of ischemic heart disease. She had an echocardiogram 10/04/12 showing ejection fraction 65-70% with grade 1 diastolic dysfunction. There was elevated pulmonary artery pressure of 53 mm mercury.  Since last visit she's had no new cardiac symptoms.  She has had no seizures.  She remains on Keppra.     Allergies  Allergen Reactions  . Ethrane [Enflurane] Nausea And Vomiting  . Codeine Nausea Only  . Synthroid [Levothyroxine Sodium] Other (See Comments)    Not effective. Causes excessive sleepiness. Takes armour thyroid  . Calcium Channel Blockers Palpitations  . Penicillins Rash    Patient Active Problem List   Diagnosis Date Noted  . Low libido 09/17/2013  . Aftercare following surgery of the circulatory system, [place] 05/10/2013  . Protein-calorie malnutrition, severe 12/29/2012  . Nausea and vomiting 12/28/2012  . Anemia 12/28/2012  . Dehydration 12/28/2012  . Hypotension 12/28/2012  . Status post cholecystectomy 12/28/2012  .  Occlusion and stenosis of carotid artery with cerebral infarction 12/21/2012  . Seizures 12/21/2012  . Postop check 12/19/2012  . Occlusion and stenosis of carotid artery without mention of cerebral infarction 10/26/2012  . Cholecystostomy tube dysfunction 10/24/2012  . 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 Acute cholecystitis 10/07/2012  . CVA (cerebral infarction) 10/05/2012  . Septic shock 10/02/2012  . Cholelithiases 09/30/2012  . Unspecified constipation 09/30/2012  . Bronchitis 03/08/2011  . Benign hypertensive heart disease without heart failure 09/28/2010  . Aortic valve sclerosis 09/28/2010  . Hypothyroidism 09/28/2010  . Polycythemia vera(238.4) 09/28/2010  . Postmenopausal state 09/28/2010  . Fibromyalgia 09/28/2010    History  Smoking status  . Never Smoker   Smokeless tobacco  . Never Used    Comment: never used tobacco    History  Alcohol Use No    Family History  Problem Relation Age of Onset  . Heart attack Father   . Heart disease Father     Heart Disease before age 53  . Coronary artery disease Mother     had aortic valve replacement  . Heart disease Mother   . Heart attack Mother   . Hyperlipidemia Sister   . Hypertension Sister     Review of Systems: Constitutional: no fever chills diaphoresis or fatigue or change in weight.  Head and neck: no hearing loss, no epistaxis, no photophobia or visual disturbance. Respiratory: No cough, shortness of breath or wheezing. Cardiovascular: No chest pain peripheral edema, palpitations. Gastrointestinal: No abdominal distention, no abdominal pain, no change in bowel  habits hematochezia or melena. Genitourinary: No dysuria, no frequency, no urgency, no nocturia. Musculoskeletal:No arthralgias, no back pain, no gait disturbance or myalgias. Neurological: No dizziness, no headaches, no numbness, no seizures, no syncope, no weakness, no tremors. Hematologic: No lymphadenopathy, no easy  bruising. Psychiatric: No confusion, no hallucinations, no sleep disturbance.    Physical Exam: Filed Vitals:   11/12/13 1348  BP: 120/78  Pulse: 86   the general appearance is that of a well-developed well-nourished woman in no distress.  She has significant chronic alopecia.The head and neck exam reveals pupils equal and reactive.  Extraocular movements are full.  There is no scleral icterus.  The mouth and pharynx are normal.  The neck is supple.  The carotids reveal no bruits.  The jugular venous pressure is normal.  The  thyroid is not enlarged.  There is no lymphadenopathy.  The chest is clear to percussion and auscultation.  There are no rales or rhonchi.  Expansion of the chest is symmetrical.  The precordium is quiet.  The first heart sound is normal.  The second heart sound is physiologically split.  There is no murmur gallop rub or click.  There is no abnormal lift or heave.  The abdomen is soft and nontender.  The bowel sounds are normal.  The liver and spleen are not enlarged.  There are no abdominal masses.  There are no abdominal bruits.  Extremities reveal good pedal pulses.  There is no phlebitis or edema.  There is no cyanosis or clubbing.  Strength is normal and symmetrical in all extremities.  There is no lateralizing weakness.  There are no sensory deficits.  The skin is warm and dry.  There is no rash.     Assessment / Plan: 1. essential hypertension 2. polycythemia rubra vera 3. status post cholecystectomy 4. old stroke with history of left carotid stenosis 5. seizure disorder 6. history of recurrent sinusitis   Plan: Continue current medication.  Recheck in 4 months for office visit and EKG

## 2013-11-12 NOTE — Patient Instructions (Signed)
**Note De-identified [name] Obfuscation** Your physician recommends that you continue on your current medications as directed. Please refer to the Current Medication list given to you today.  Your physician recommends that you schedule a follow-up appointment in: 4 months  

## 2013-11-12 NOTE — Assessment & Plan Note (Signed)
The patient complains of right upper quadrant tenderness in the area where she had her previous cholecystectomy

## 2013-11-12 NOTE — Assessment & Plan Note (Signed)
She is clinically euthyroid at this time

## 2013-11-13 ENCOUNTER — Ambulatory Visit: Payer: Medicare Other

## 2013-11-13 VITALS — BP 119/56 | HR 88 | Temp 97.1°F

## 2013-11-13 DIAGNOSIS — D45 Polycythemia vera: Secondary | ICD-10-CM

## 2013-11-19 ENCOUNTER — Other Ambulatory Visit: Payer: Self-pay | Admitting: *Deleted

## 2013-11-21 ENCOUNTER — Other Ambulatory Visit: Payer: Self-pay | Admitting: *Deleted

## 2013-11-21 ENCOUNTER — Other Ambulatory Visit (HOSPITAL_BASED_OUTPATIENT_CLINIC_OR_DEPARTMENT_OTHER): Payer: Medicare Other | Admitting: Lab

## 2013-11-21 DIAGNOSIS — D45 Polycythemia vera: Secondary | ICD-10-CM

## 2013-11-21 DIAGNOSIS — M797 Fibromyalgia: Secondary | ICD-10-CM

## 2013-11-21 DIAGNOSIS — IMO0001 Reserved for inherently not codable concepts without codable children: Secondary | ICD-10-CM | POA: Diagnosis not present

## 2013-11-21 LAB — CMP (CANCER CENTER ONLY)
ALBUMIN: 3.5 g/dL (ref 3.3–5.5)
ALK PHOS: 71 U/L (ref 26–84)
ALT: 10 U/L (ref 10–47)
AST: 18 U/L (ref 11–38)
BUN, Bld: 9 mg/dL (ref 7–22)
CO2: 30 mEq/L (ref 18–33)
Calcium: 8.8 mg/dL (ref 8.0–10.3)
Chloride: 100 mEq/L (ref 98–108)
Creat: 0.7 mg/dl (ref 0.6–1.2)
GLUCOSE: 116 mg/dL (ref 73–118)
POTASSIUM: 3.3 meq/L (ref 3.3–4.7)
SODIUM: 140 meq/L (ref 128–145)
TOTAL PROTEIN: 6.8 g/dL (ref 6.4–8.1)
Total Bilirubin: 0.5 mg/dl (ref 0.20–1.60)

## 2013-11-21 LAB — CBC WITH DIFFERENTIAL (CANCER CENTER ONLY)
BASO#: 0 10*3/uL (ref 0.0–0.2)
BASO%: 0.7 % (ref 0.0–2.0)
EOS ABS: 0.4 10*3/uL (ref 0.0–0.5)
EOS%: 9.9 % — ABNORMAL HIGH (ref 0.0–7.0)
HCT: 23.1 % — ABNORMAL LOW (ref 34.8–46.6)
HEMOGLOBIN: 5.4 g/dL — AB (ref 11.6–15.9)
LYMPH#: 0.6 10*3/uL — ABNORMAL LOW (ref 0.9–3.3)
LYMPH%: 15.3 % (ref 14.0–48.0)
MCH: 15.1 pg — AB (ref 26.0–34.0)
MCHC: 23.4 g/dL — ABNORMAL LOW (ref 32.0–36.0)
MCV: 65 fL — AB (ref 81–101)
MONO#: 0.5 10*3/uL (ref 0.1–0.9)
MONO%: 10.9 % (ref 0.0–13.0)
NEUT#: 2.6 10*3/uL (ref 1.5–6.5)
NEUT%: 63.2 % (ref 39.6–80.0)
Platelets: 181 10*3/uL (ref 145–400)
RBC: 3.58 10*6/uL — AB (ref 3.70–5.32)
RDW: 21.1 % — ABNORMAL HIGH (ref 11.1–15.7)
WBC: 4.1 10*3/uL (ref 3.9–10.0)

## 2013-11-21 LAB — CHCC SATELLITE - SMEAR

## 2013-11-21 LAB — RETICULOCYTES (CHCC)
ABS Retic: 32.7 10*3/uL (ref 19.0–186.0)
RBC.: 3.63 MIL/uL — AB (ref 3.87–5.11)
Retic Ct Pct: 0.9 % (ref 0.4–2.3)

## 2013-11-21 MED ORDER — OXYCODONE HCL ER 20 MG PO T12A: EXTENDED_RELEASE_TABLET | ORAL | Status: DC

## 2013-11-21 MED ORDER — OXYCODONE HCL ER 10 MG PO T12A: EXTENDED_RELEASE_TABLET | ORAL | Status: DC

## 2013-11-21 MED ORDER — OXYCODONE HCL 15 MG PO TABS: ORAL_TABLET | ORAL | Status: DC

## 2013-11-22 ENCOUNTER — Other Ambulatory Visit: Payer: Self-pay | Admitting: Cardiology

## 2013-11-22 ENCOUNTER — Other Ambulatory Visit: Payer: Self-pay | Admitting: Neurology

## 2013-11-23 NOTE — Telephone Encounter (Signed)
Noted  

## 2013-12-04 ENCOUNTER — Ambulatory Visit (HOSPITAL_BASED_OUTPATIENT_CLINIC_OR_DEPARTMENT_OTHER): Payer: Medicare Other | Admitting: Hematology & Oncology

## 2013-12-04 ENCOUNTER — Other Ambulatory Visit: Payer: Self-pay | Admitting: *Deleted

## 2013-12-04 ENCOUNTER — Encounter: Payer: Self-pay | Admitting: Hematology & Oncology

## 2013-12-04 ENCOUNTER — Other Ambulatory Visit (HOSPITAL_BASED_OUTPATIENT_CLINIC_OR_DEPARTMENT_OTHER): Payer: Medicare Other | Admitting: Lab

## 2013-12-04 VITALS — BP 132/51 | HR 95 | Temp 98.1°F | Resp 95 | Ht 63.0 in | Wt 163.0 lb

## 2013-12-04 DIAGNOSIS — I359 Nonrheumatic aortic valve disorder, unspecified: Secondary | ICD-10-CM

## 2013-12-04 DIAGNOSIS — E039 Hypothyroidism, unspecified: Secondary | ICD-10-CM | POA: Diagnosis not present

## 2013-12-04 DIAGNOSIS — D45 Polycythemia vera: Secondary | ICD-10-CM

## 2013-12-04 DIAGNOSIS — Z780 Asymptomatic menopausal state: Secondary | ICD-10-CM

## 2013-12-04 DIAGNOSIS — I6529 Occlusion and stenosis of unspecified carotid artery: Secondary | ICD-10-CM

## 2013-12-04 DIAGNOSIS — Z13820 Encounter for screening for osteoporosis: Secondary | ICD-10-CM | POA: Diagnosis not present

## 2013-12-04 DIAGNOSIS — I358 Other nonrheumatic aortic valve disorders: Secondary | ICD-10-CM

## 2013-12-04 LAB — CBC WITH DIFFERENTIAL (CANCER CENTER ONLY)
BASO#: 0 10*3/uL (ref 0.0–0.2)
BASO%: 0.8 % (ref 0.0–2.0)
EOS ABS: 0.5 10*3/uL (ref 0.0–0.5)
EOS%: 9.5 % — ABNORMAL HIGH (ref 0.0–7.0)
HCT: 24.6 % — ABNORMAL LOW (ref 34.8–46.6)
HGB: 5.8 g/dL — CL (ref 11.6–15.9)
LYMPH#: 0.7 10*3/uL — ABNORMAL LOW (ref 0.9–3.3)
LYMPH%: 13.5 % — ABNORMAL LOW (ref 14.0–48.0)
MCH: 15.2 pg — ABNORMAL LOW (ref 26.0–34.0)
MCHC: 23.6 g/dL — ABNORMAL LOW (ref 32.0–36.0)
MCV: 64 fL — AB (ref 81–101)
MONO#: 0.6 10*3/uL (ref 0.1–0.9)
MONO%: 11.4 % (ref 0.0–13.0)
NEUT#: 3.4 10*3/uL (ref 1.5–6.5)
NEUT%: 64.8 % (ref 39.6–80.0)
Platelets: 193 10*3/uL (ref 145–400)
RBC: 3.82 10*6/uL (ref 3.70–5.32)
RDW: 21.9 % — AB (ref 11.1–15.7)
WBC: 5.3 10*3/uL (ref 3.9–10.0)

## 2013-12-04 LAB — TECHNOLOGIST REVIEW CHCC SATELLITE

## 2013-12-05 NOTE — Progress Notes (Signed)
Hematology and Oncology Follow Up Visit  [name] [id number] 1945-07-21 68 y.o. 12/05/2013   Principle Diagnosis:  Polycythemia vera -- hyperviscosity variant. 2. 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  Current Therapy:    Phlebotomy to maintain hemoglobin below 6.0     Interim History:  [name] is back for followup. She is doing very well. She still is not on hormonal therapy. Her gynecologist does not feel comfortable putting her on this.  She's had no problem headaches. She's had no problems with cough or shortness of breath.  We probably, last phlebotomized her about 4 weeks ago. She really is iron deficient. Her last ferritin was less than 4.  She's had no problems with cerebrovascular issues. She's had no headache. She does no wish for any further surgery right now. She crud Doppler was done about 4 weeks ago. She had no significant stenoses. There is some slight decrease in velocity on the right side. However, no stenosis was seen.   She's had no skin problems. There has been no change in bowel or bladder habits.  Medications:Allergies:  Allergies  Allergen Reactions  . Ethrane [Enflurane] Nausea And Vomiting  . Codeine Nausea Only  . Synthroid [Levothyroxine Sodium] Other (See Comments)    Not effective. Causes excessive sleepiness. Takes armour thyroid  . Calcium Channel Blockers Palpitations  . Penicillins Rash    Past Medical History, Surgical history, Social history, and Family History were reviewed and updated.  Review of Systems: As above  Physical Exam:  height is 5\' 3"  (1.6 m) and weight is 163 lb (73.936 kg). Her oral temperature is 98.1 F (36.7 C). Her blood pressure is 132/51 and her pulse is 95. Her respiration is 95.   Well-developed and well-nourished white female in no obvious distress. Head and neck exam shows pale conjunctiva. There is no oral lesions. There is no adenopathy in the neck. Lungs are  clear bilaterally. Cardiac exam regular in rhythm with a 1/6 systolic ejection murmur. Abdomen is soft. Has good bowel sounds. There is no palpable liver or spleen tip appreciative well-healed laparotomy scars. Back exam no tenderness over the spine ribs or hips. Extremities shows no clubbing cyanosis or edema. Neurological exam shows no focal neurological deficits. Skin exam no rashes, ecchymoses or petechia.  Lab Results  Component Value Date   WBC 5.3 12/04/2013   HGB 5.8* 12/04/2013   HCT 24.6* 12/04/2013   MCV 64* 12/04/2013   PLT 193 12/04/2013     Chemistry      Component Value Date/Time   NA 140 11/21/2013 1307   NA 140 08/15/2013 1512   K 3.3 11/21/2013 1307   K 3.7 08/15/2013 1512   CL 100 11/21/2013 1307   CL 103 08/15/2013 1512   CO2 30 11/21/2013 1307   CO2 30 08/15/2013 1512   BUN 9 11/21/2013 1307   BUN 6 08/15/2013 1512   CREATININE 0.7 11/21/2013 1307   CREATININE 0.69 08/15/2013 1512      Component Value Date/Time   CALCIUM 8.8 11/21/2013 1307   CALCIUM 8.8 08/15/2013 1512   ALKPHOS 71 11/21/2013 1307   ALKPHOS 70 08/15/2013 1512   AST 18 11/21/2013 1307   AST 12 08/15/2013 1512   ALT 10 11/21/2013 1307   ALT <8 08/15/2013 1512   BILITOT 0.50 11/21/2013 1307   BILITOT 0.4 08/15/2013 1512         Impression and Plan: [name] is 68 year old  female with polycythemia vera. She is hyperviscous.  She does have any kind of cardiac issues right now. She sees her cardiologist in about a month.  Unfortunately , it does not look like she ever will go back onto hormonal replacement. Hopefully, her libido will be improved by the new medication coming out later on this summer . I think that the FDA approved this medication.  We will continue to follow her blood work and phlebotomize her as indicated. We will check her blood counts every 2 weeks. I will see her back myself in 2 months.   Volanda Napoleon, MD 9/9/20156:19 AM

## 2013-12-11 ENCOUNTER — Other Ambulatory Visit: Payer: Self-pay | Admitting: *Deleted

## 2013-12-11 ENCOUNTER — Telehealth: Payer: Self-pay | Admitting: Hematology & Oncology

## 2013-12-11 DIAGNOSIS — E2839 Other primary ovarian failure: Secondary | ICD-10-CM

## 2013-12-11 NOTE — Telephone Encounter (Signed)
Pt aware of bone density on 9-21 and that she can walk in for x-ray

## 2013-12-17 ENCOUNTER — Ambulatory Visit
Admission: RE | Admit: 2013-12-17 | Discharge: 2013-12-17 | Disposition: A | Discharge status: Home / Self Care | Payer: Medicare Other | Source: Ambulatory Visit

## 2013-12-17 ENCOUNTER — Other Ambulatory Visit: Payer: Self-pay

## 2013-12-17 ENCOUNTER — Other Ambulatory Visit: Payer: Self-pay | Admitting: *Deleted

## 2013-12-17 ENCOUNTER — Inpatient Hospital Stay: Admission: RE | Admit: 2013-12-17 | Payer: Medicare Other | Source: Ambulatory Visit

## 2013-12-17 ENCOUNTER — Other Ambulatory Visit: Payer: Self-pay | Admitting: Hematology & Oncology

## 2013-12-17 DIAGNOSIS — Z1231 Encounter for screening mammogram for malignant neoplasm of breast: Secondary | ICD-10-CM | POA: Diagnosis not present

## 2013-12-17 DIAGNOSIS — Z13820 Encounter for screening for osteoporosis: Secondary | ICD-10-CM

## 2013-12-17 DIAGNOSIS — Z780 Asymptomatic menopausal state: Secondary | ICD-10-CM | POA: Diagnosis not present

## 2013-12-17 DIAGNOSIS — M797 Fibromyalgia: Secondary | ICD-10-CM

## 2013-12-17 DIAGNOSIS — D45 Polycythemia vera: Secondary | ICD-10-CM

## 2013-12-17 MED ORDER — OXYCODONE HCL 15 MG PO TABS
ORAL_TABLET | ORAL | Status: DC
Start: 2013-12-28 — End: 2014-01-14

## 2013-12-17 MED ORDER — OXYCODONE HCL ER 10 MG PO T12A: EXTENDED_RELEASE_TABLET | ORAL | Status: DC

## 2013-12-17 MED ORDER — OXYCODONE HCL ER 20 MG PO T12A: EXTENDED_RELEASE_TABLET | ORAL | Status: DC

## 2013-12-19 ENCOUNTER — Ambulatory Visit (HOSPITAL_BASED_OUTPATIENT_CLINIC_OR_DEPARTMENT_OTHER)
Admission: RE | Admit: 2013-12-19 | Discharge: 2013-12-19 | Disposition: A | Discharge status: Home / Self Care | Payer: Medicare Other | Source: Ambulatory Visit | Attending: Hematology & Oncology | Admitting: Hematology & Oncology

## 2013-12-19 ENCOUNTER — Other Ambulatory Visit (HOSPITAL_BASED_OUTPATIENT_CLINIC_OR_DEPARTMENT_OTHER): Payer: Medicare Other | Admitting: Lab

## 2013-12-19 DIAGNOSIS — Z13820 Encounter for screening for osteoporosis: Secondary | ICD-10-CM | POA: Insufficient documentation

## 2013-12-19 DIAGNOSIS — E039 Hypothyroidism, unspecified: Secondary | ICD-10-CM

## 2013-12-19 DIAGNOSIS — D45 Polycythemia vera: Secondary | ICD-10-CM

## 2013-12-19 DIAGNOSIS — M25519 Pain in unspecified shoulder: Secondary | ICD-10-CM | POA: Insufficient documentation

## 2013-12-19 DIAGNOSIS — I358 Other nonrheumatic aortic valve disorders: Secondary | ICD-10-CM

## 2013-12-19 LAB — CBC WITH DIFFERENTIAL (CANCER CENTER ONLY)
BASO#: 0 10*3/uL (ref 0.0–0.2)
BASO%: 0.5 % (ref 0.0–2.0)
EOS%: 3.4 % (ref 0.0–7.0)
Eosinophils Absolute: 0.2 10*3/uL (ref 0.0–0.5)
HEMATOCRIT: 24.1 % — AB (ref 34.8–46.6)
HEMOGLOBIN: 5.7 g/dL — AB (ref 11.6–15.9)
LYMPH#: 0.7 10*3/uL — ABNORMAL LOW (ref 0.9–3.3)
LYMPH%: 16.3 % (ref 14.0–48.0)
MCH: 15.6 pg — AB (ref 26.0–34.0)
MCHC: 23.7 g/dL — ABNORMAL LOW (ref 32.0–36.0)
MCV: 66 fL — ABNORMAL LOW (ref 81–101)
MONO#: 0.7 10*3/uL (ref 0.1–0.9)
MONO%: 15.2 % — AB (ref 0.0–13.0)
NEUT#: 2.9 10*3/uL (ref 1.5–6.5)
NEUT%: 64.6 % (ref 39.6–80.0)
Platelets: 183 10*3/uL (ref 145–400)
RBC: 3.66 10*6/uL — AB (ref 3.70–5.32)
RDW: 22.8 % — ABNORMAL HIGH (ref 11.1–15.7)
WBC: 4.4 10*3/uL (ref 3.9–10.0)

## 2013-12-20 ENCOUNTER — Telehealth: Payer: Self-pay | Admitting: *Deleted

## 2013-12-20 ENCOUNTER — Other Ambulatory Visit: Payer: Self-pay | Admitting: Nurse Practitioner

## 2013-12-20 NOTE — Telephone Encounter (Signed)
Called patient to let her know that her bone density is excellent and shoulder X-ray is ok!! No arthritis.  Hgb 5.7

## 2013-12-20 NOTE — Telephone Encounter (Signed)
Message copied by Rico Ala on Thu Dec 20, 2013  9:45 AM ------      Message from: Burney Gauze R      Created: Wed Dec 19, 2013  7:25 AM       Call - bone density is excellent!!!  [name] ------

## 2013-12-24 ENCOUNTER — Telehealth: Payer: Self-pay | Admitting: Cardiology

## 2013-12-24 ENCOUNTER — Telehealth: Payer: Self-pay | Admitting: Hematology & Oncology

## 2013-12-24 NOTE — Telephone Encounter (Signed)
Patient called and cx 12/26/13 apt and stated she would be here for her 01/02/14 apt

## 2013-12-24 NOTE — Telephone Encounter (Signed)
°  Patient would like a refill on her Cipro. Called refill team and was told I had to send this to you to be refilled. Please advise.

## 2013-12-25 ENCOUNTER — Telehealth: Payer: Self-pay | Admitting: Cardiology

## 2013-12-25 MED ORDER — CIPROFLOXACIN HCL 500 MG PO TABS: 500.0000 mg | ORAL_TABLET | Freq: Two times a day (BID) | ORAL | Status: DC

## 2013-12-25 NOTE — Telephone Encounter (Signed)
New message    Patient calling stating she need a refill  - cipro  500 mg twice a day  .  Has to be approve by Dr. Mare Ferrari.     [facility] in [place].

## 2013-12-25 NOTE — Telephone Encounter (Signed)
Okay for Cipro 500 mg BID #14

## 2013-12-25 NOTE — Telephone Encounter (Signed)
Patient requesting Cipro for both a suspected UTI, and a sinus infection. Has dysuria and urgency. Possible low grade fever, some "sweats" runny nose, some colored drainage, headache. States these symptoms present since last week.

## 2013-12-25 NOTE — Telephone Encounter (Signed)
Patient informed.Rx sent to [facility] in [place], [place]

## 2013-12-26 ENCOUNTER — Other Ambulatory Visit: Payer: Medicare Other | Admitting: Lab

## 2013-12-27 ENCOUNTER — Other Ambulatory Visit: Payer: Self-pay | Admitting: Cardiology

## 2013-12-28 ENCOUNTER — Telehealth: Payer: Self-pay | Admitting: Cardiology

## 2013-12-28 ENCOUNTER — Other Ambulatory Visit: Payer: Self-pay | Admitting: Cardiology

## 2013-12-28 MED ORDER — THYROID 60 MG PO TABS: 150.0000 mg | ORAL_TABLET | Freq: Every day | ORAL | Status: DC

## 2013-12-28 NOTE — Telephone Encounter (Signed)
Refilled as requested  

## 2013-12-28 NOTE — Telephone Encounter (Signed)
New message          Pt needs thyroid medication refilled today

## 2014-01-01 ENCOUNTER — Other Ambulatory Visit (HOSPITAL_BASED_OUTPATIENT_CLINIC_OR_DEPARTMENT_OTHER): Payer: Medicare Other | Admitting: Lab

## 2014-01-01 ENCOUNTER — Ambulatory Visit (HOSPITAL_BASED_OUTPATIENT_CLINIC_OR_DEPARTMENT_OTHER): Payer: Medicare Other

## 2014-01-01 VITALS — BP 104/60 | HR 71 | Temp 97.8°F | Resp 18

## 2014-01-01 DIAGNOSIS — D45 Polycythemia vera: Secondary | ICD-10-CM | POA: Diagnosis not present

## 2014-01-01 DIAGNOSIS — E039 Hypothyroidism, unspecified: Secondary | ICD-10-CM | POA: Diagnosis not present

## 2014-01-01 DIAGNOSIS — I358 Other nonrheumatic aortic valve disorders: Secondary | ICD-10-CM | POA: Diagnosis not present

## 2014-01-01 LAB — COMPREHENSIVE METABOLIC PANEL
ALK PHOS: 74 U/L (ref 39–117)
AST: 13 U/L (ref 0–37)
Albumin: 4.1 g/dL (ref 3.5–5.2)
BUN: 9 mg/dL (ref 6–23)
CO2: 31 mEq/L (ref 19–32)
Calcium: 8.9 mg/dL (ref 8.4–10.5)
Chloride: 100 mEq/L (ref 96–112)
Creatinine, Ser: 0.84 mg/dL (ref 0.50–1.10)
Glucose, Bld: 108 mg/dL — ABNORMAL HIGH (ref 70–99)
Potassium: 3.7 mEq/L (ref 3.5–5.3)
SODIUM: 138 meq/L (ref 135–145)
TOTAL PROTEIN: 6.6 g/dL (ref 6.0–8.3)
Total Bilirubin: 0.5 mg/dL (ref 0.2–1.2)

## 2014-01-01 LAB — CBC WITH DIFFERENTIAL (CANCER CENTER ONLY)
BASO#: 0 10*3/uL (ref 0.0–0.2)
BASO%: 0.7 % (ref 0.0–2.0)
EOS%: 4.9 % (ref 0.0–7.0)
Eosinophils Absolute: 0.2 10*3/uL (ref 0.0–0.5)
HCT: 27.3 % — ABNORMAL LOW (ref 34.8–46.6)
HGB: 6.6 g/dL — CL (ref 11.6–15.9)
LYMPH#: 0.6 10*3/uL — ABNORMAL LOW (ref 0.9–3.3)
LYMPH%: 13.7 % — ABNORMAL LOW (ref 14.0–48.0)
MCH: 16 pg — AB (ref 26.0–34.0)
MCHC: 24.2 g/dL — ABNORMAL LOW (ref 32.0–36.0)
MCV: 66 fL — ABNORMAL LOW (ref 81–101)
MONO#: 0.6 10*3/uL (ref 0.1–0.9)
MONO%: 13.9 % — AB (ref 0.0–13.0)
NEUT%: 66.8 % (ref 39.6–80.0)
NEUTROS ABS: 2.9 10*3/uL (ref 1.5–6.5)
PLATELETS: 216 10*3/uL (ref 145–400)
RBC: 4.13 10*6/uL (ref 3.70–5.32)
RDW: 23 % — ABNORMAL HIGH (ref 11.1–15.7)
WBC: 4.3 10*3/uL (ref 3.9–10.0)

## 2014-01-01 LAB — RETICULOCYTES (CHCC)
ABS RETIC: 46.2 10*3/uL (ref 19.0–186.0)
RBC.: 4.2 MIL/uL (ref 3.87–5.11)
RETIC CT PCT: 1.1 % (ref 0.4–2.3)

## 2014-01-01 LAB — CHCC SATELLITE - SMEAR

## 2014-01-01 LAB — FERRITIN CHCC

## 2014-01-01 LAB — IRON AND TIBC CHCC
%SAT: 3 % — ABNORMAL LOW (ref 21–57)
Iron: 16 ug/dL — ABNORMAL LOW (ref 41–142)
TIBC: 491 ug/dL — ABNORMAL HIGH (ref 236–444)
UIBC: 475 ug/dL — ABNORMAL HIGH (ref 120–384)

## 2014-01-01 LAB — LACTATE DEHYDROGENASE: LDH: 126 U/L (ref 94–250)

## 2014-01-01 NOTE — Progress Notes (Signed)
[name] presents today for phlebotomy per MD orders. Phlebotomy procedure started at 1200 and ended at 1205. 500 grams removed. Patient observed for 30 minutes after procedure without any incident. Patient tolerated procedure well. IV needle removed intact.

## 2014-01-01 NOTE — Patient Instructions (Signed)

## 2014-01-02 ENCOUNTER — Other Ambulatory Visit: Payer: Medicare Other | Admitting: Lab

## 2014-01-03 DIAGNOSIS — H34812 Central retinal vein occlusion, left eye: Secondary | ICD-10-CM | POA: Diagnosis not present

## 2014-01-03 DIAGNOSIS — H25013 Cortical age-related cataract, bilateral: Secondary | ICD-10-CM | POA: Diagnosis not present

## 2014-01-03 DIAGNOSIS — H35431 Paving stone degeneration of retina, right eye: Secondary | ICD-10-CM | POA: Diagnosis not present

## 2014-01-14 ENCOUNTER — Other Ambulatory Visit: Payer: Self-pay | Admitting: Nurse Practitioner

## 2014-01-14 DIAGNOSIS — D45 Polycythemia vera: Secondary | ICD-10-CM

## 2014-01-14 DIAGNOSIS — M797 Fibromyalgia: Secondary | ICD-10-CM

## 2014-01-14 MED ORDER — OXYCODONE HCL ER 20 MG PO T12A: EXTENDED_RELEASE_TABLET | ORAL | Status: DC

## 2014-01-14 MED ORDER — OXYCODONE HCL ER 10 MG PO T12A: EXTENDED_RELEASE_TABLET | ORAL | Status: DC

## 2014-01-14 MED ORDER — OXYCODONE HCL 15 MG PO TABS: ORAL_TABLET | ORAL | Status: DC

## 2014-01-16 ENCOUNTER — Other Ambulatory Visit (HOSPITAL_BASED_OUTPATIENT_CLINIC_OR_DEPARTMENT_OTHER): Payer: Medicare Other | Admitting: Lab

## 2014-01-16 ENCOUNTER — Other Ambulatory Visit: Payer: Self-pay | Admitting: *Deleted

## 2014-01-16 ENCOUNTER — Other Ambulatory Visit: Payer: Self-pay | Admitting: Neurology

## 2014-01-16 DIAGNOSIS — D751 Secondary polycythemia: Secondary | ICD-10-CM

## 2014-01-16 DIAGNOSIS — I358 Other nonrheumatic aortic valve disorders: Secondary | ICD-10-CM

## 2014-01-16 DIAGNOSIS — D45 Polycythemia vera: Secondary | ICD-10-CM | POA: Diagnosis not present

## 2014-01-16 DIAGNOSIS — E039 Hypothyroidism, unspecified: Secondary | ICD-10-CM

## 2014-01-16 LAB — CBC WITH DIFFERENTIAL (CANCER CENTER ONLY)
BASO#: 0 10*3/uL (ref 0.0–0.2)
BASO%: 0.3 % (ref 0.0–2.0)
EOS ABS: 0.2 10*3/uL (ref 0.0–0.5)
EOS%: 6.2 % (ref 0.0–7.0)
HCT: 22.5 % — ABNORMAL LOW (ref 34.8–46.6)
HGB: 5.3 g/dL — CL (ref 11.6–15.9)
LYMPH#: 0.6 10*3/uL — AB (ref 0.9–3.3)
LYMPH%: 17.8 % (ref 14.0–48.0)
MCH: 15.5 pg — ABNORMAL LOW (ref 26.0–34.0)
MCHC: 23.6 g/dL — AB (ref 32.0–36.0)
MCV: 66 fL — ABNORMAL LOW (ref 81–101)
MONO#: 0.5 10*3/uL (ref 0.1–0.9)
MONO%: 15 % — ABNORMAL HIGH (ref 0.0–13.0)
NEUT%: 60.7 % (ref 39.6–80.0)
NEUTROS ABS: 2.1 10*3/uL (ref 1.5–6.5)
PLATELETS: 144 10*3/uL — AB (ref 145–400)
RBC: 3.41 10*6/uL — AB (ref 3.70–5.32)
RDW: 21.2 % — ABNORMAL HIGH (ref 11.1–15.7)
WBC: 3.5 10*3/uL — ABNORMAL LOW (ref 3.9–10.0)

## 2014-01-16 MED ORDER — CLOPIDOGREL BISULFATE 75 MG PO TABS: 75.0000 mg | ORAL_TABLET | Freq: Every day | ORAL | Status: DC

## 2014-01-17 ENCOUNTER — Other Ambulatory Visit: Payer: Self-pay | Admitting: Cardiology

## 2014-01-17 DIAGNOSIS — G4700 Insomnia, unspecified: Secondary | ICD-10-CM

## 2014-01-18 NOTE — Telephone Encounter (Signed)
Called to [facility] to [facility]

## 2014-01-30 ENCOUNTER — Other Ambulatory Visit (HOSPITAL_BASED_OUTPATIENT_CLINIC_OR_DEPARTMENT_OTHER): Payer: Medicare Other | Admitting: Lab

## 2014-01-30 ENCOUNTER — Ambulatory Visit (HOSPITAL_BASED_OUTPATIENT_CLINIC_OR_DEPARTMENT_OTHER): Payer: Medicare Other | Admitting: Hematology & Oncology

## 2014-01-30 ENCOUNTER — Encounter: Payer: Self-pay | Admitting: Hematology & Oncology

## 2014-01-30 ENCOUNTER — Other Ambulatory Visit: Payer: Self-pay | Admitting: *Deleted

## 2014-01-30 VITALS — BP 130/48 | HR 83 | Temp 97.4°F | Resp 14 | Ht 64.0 in | Wt 163.0 lb

## 2014-01-30 DIAGNOSIS — D45 Polycythemia vera: Secondary | ICD-10-CM

## 2014-01-30 DIAGNOSIS — E039 Hypothyroidism, unspecified: Secondary | ICD-10-CM

## 2014-01-30 DIAGNOSIS — M797 Fibromyalgia: Secondary | ICD-10-CM

## 2014-01-30 DIAGNOSIS — I358 Other nonrheumatic aortic valve disorders: Secondary | ICD-10-CM

## 2014-01-30 LAB — CBC WITH DIFFERENTIAL (CANCER CENTER ONLY)
BASO#: 0 10*3/uL (ref 0.0–0.2)
BASO%: 0.5 % (ref 0.0–2.0)
EOS ABS: 0.2 10*3/uL (ref 0.0–0.5)
EOS%: 5.2 % (ref 0.0–7.0)
HCT: 23.2 % — ABNORMAL LOW (ref 34.8–46.6)
HEMOGLOBIN: 5.5 g/dL — AB (ref 11.6–15.9)
LYMPH#: 0.7 10*3/uL — ABNORMAL LOW (ref 0.9–3.3)
LYMPH%: 15.6 % (ref 14.0–48.0)
MCH: 15.9 pg — AB (ref 26.0–34.0)
MCHC: 23.7 g/dL — ABNORMAL LOW (ref 32.0–36.0)
MCV: 67 fL — ABNORMAL LOW (ref 81–101)
MONO#: 0.6 10*3/uL (ref 0.1–0.9)
MONO%: 13.8 % — AB (ref 0.0–13.0)
NEUT#: 2.9 10*3/uL (ref 1.5–6.5)
NEUT%: 64.9 % (ref 39.6–80.0)
Platelets: 178 10*3/uL (ref 145–400)
RBC: 3.47 10*6/uL — AB (ref 3.70–5.32)
RDW: 22.4 % — ABNORMAL HIGH (ref 11.1–15.7)
WBC: 4.4 10*3/uL (ref 3.9–10.0)

## 2014-01-30 MED ORDER — OXYCODONE HCL ER 10 MG PO T12A: EXTENDED_RELEASE_TABLET | ORAL | Status: DC

## 2014-01-30 MED ORDER — OXYCODONE HCL ER 20 MG PO T12A: EXTENDED_RELEASE_TABLET | ORAL | Status: DC

## 2014-01-30 MED ORDER — OXYCODONE HCL 15 MG PO TABS: ORAL_TABLET | ORAL | Status: DC

## 2014-01-30 NOTE — Progress Notes (Signed)
Hematology and Oncology Follow Up Visit  GRACELIN WEISBERG [id number] 1946/03/28 68 y.o. 01/30/2014   Principle Diagnosis:  Polycythemia vera -- hyperviscosity variant. 2. 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  Current Therapy:    Phlebotomy to maintain hemoglobin below 6.0     Interim History:  Ms.  Sloniker is back for followup. Is doing okay. Her fibromyalgia has been acting up.  She gets her blood checked every couple weeks. She was phlebotomized 2 weeks ago.  She's had no problems with cough. There is no abdominal pain. She's had no leg swelling. She's had no fever. She's had no nausea or vomiting.  She is profoundly iron deficient from her phlebotomies. Medications:Allergies:  Allergies  Allergen Reactions  . Ethrane [Enflurane] Nausea And Vomiting  . Codeine Nausea Only  . Synthroid [Levothyroxine Sodium] Other (See Comments)    Not effective. Causes excessive sleepiness. Takes armour thyroid  . Calcium Channel Blockers Palpitations  . Penicillins Rash    Past Medical History, Surgical history, Social history, and Family History were reviewed and updated.  Review of Systems: As above  Physical Exam:  height is 5\' 4"  (1.626 m) and weight is 163 lb (73.936 kg). Her oral temperature is 97.4 F (36.3 C). Her blood pressure is 130/48 and her pulse is 83. Her respiration is 14.   Well-developed and well-nourished white female in no obvious distress. Head and neck exam shows pale conjunctiva. There is no oral lesions. There is no adenopathy in the neck. Lungs are clear bilaterally. Cardiac exam regular in rhythm with a 1/6 systolic ejection murmur. Abdomen is soft. Has good bowel sounds. There is no palpable liver or spleen tip appreciative well-healed laparotomy scars. Back exam no tenderness over the spine ribs or hips. Extremities shows no clubbing cyanosis or edema. Neurological exam shows no focal neurological deficits. Skin exam no  rashes, ecchymoses or petechia.  Lab Results  Component Value Date   WBC 4.4 01/30/2014   HGB 5.5* 01/30/2014   HCT 23.2* 01/30/2014   MCV 67* 01/30/2014   PLT 178 01/30/2014     Chemistry      Component Value Date/Time   NA 138 01/01/2014 1106   NA 140 11/21/2013 1307   K 3.7 01/01/2014 1106   K 3.3 11/21/2013 1307   CL 100 01/01/2014 1106   CL 100 11/21/2013 1307   CO2 31 01/01/2014 1106   CO2 30 11/21/2013 1307   BUN 9 01/01/2014 1106   BUN 9 11/21/2013 1307   CREATININE 0.84 01/01/2014 1106   CREATININE 0.7 11/21/2013 1307      Component Value Date/Time   CALCIUM 8.9 01/01/2014 1106   CALCIUM 8.8 11/21/2013 1307   ALKPHOS 74 01/01/2014 1106   ALKPHOS 71 11/21/2013 1307   AST 13 01/01/2014 1106   AST 18 11/21/2013 1307   ALT <8 01/01/2014 1106   ALT 10 11/21/2013 1307   BILITOT 0.5 01/01/2014 1106   BILITOT 0.50 11/21/2013 1307         Impression and Plan: Ms. Wooley is 68 year old female with polycythemia vera. She is hyperviscous. She does very well with her hemoglobin below 6.  We will continue to check her blood every 2 weeks. I suspect that she probably will need to have her next phlebotomy in 2 weeks. I want to assure that she feels well over the holidays.  She saw her cardiologist recently. He feels that she has been doing quite well.  I  will see her back in 2 more months.  Volanda Napoleon, MD 11/4/20155:44 PM

## 2014-01-30 NOTE — Telephone Encounter (Signed)
Reprinted Rx because previous Rx written by Doyce Loose NP could not be filled in [place]

## 2014-01-31 ENCOUNTER — Telehealth: Payer: Self-pay | Admitting: Hematology & Oncology

## 2014-01-31 NOTE — Telephone Encounter (Signed)
Pt aware of 11-18 and every 2 weeks

## 2014-02-06 ENCOUNTER — Telehealth: Payer: Self-pay | Admitting: Cardiology

## 2014-02-06 NOTE — Telephone Encounter (Signed)
Patient c/o severe burning when urinates and requesting Rx be called in for UTI Would like Rx for Cipro Patient has no PCP Advised  Dr. Mare Ferrari out of the office until Friday, will forward for review

## 2014-02-06 NOTE — Telephone Encounter (Signed)
New message     Pt want something for a UTI

## 2014-02-06 NOTE — Telephone Encounter (Signed)
Okay to call in cipro 500 mg BID #14

## 2014-02-07 MED ORDER — CIPROFLOXACIN HCL 500 MG PO TABS: 500.0000 mg | ORAL_TABLET | Freq: Two times a day (BID) | ORAL | Status: DC

## 2014-02-07 NOTE — Telephone Encounter (Signed)
Advised patient and sent to pharmacy  

## 2014-02-11 ENCOUNTER — Other Ambulatory Visit: Payer: Self-pay | Admitting: Neurology

## 2014-02-13 ENCOUNTER — Other Ambulatory Visit (HOSPITAL_BASED_OUTPATIENT_CLINIC_OR_DEPARTMENT_OTHER): Payer: Medicare Other | Admitting: Lab

## 2014-02-13 DIAGNOSIS — D45 Polycythemia vera: Secondary | ICD-10-CM

## 2014-02-13 DIAGNOSIS — E039 Hypothyroidism, unspecified: Secondary | ICD-10-CM

## 2014-02-13 DIAGNOSIS — I358 Other nonrheumatic aortic valve disorders: Secondary | ICD-10-CM

## 2014-02-13 LAB — CBC WITH DIFFERENTIAL (CANCER CENTER ONLY)
BASO#: 0 10*3/uL (ref 0.0–0.2)
BASO%: 0.3 % (ref 0.0–2.0)
EOS%: 3.5 % (ref 0.0–7.0)
Eosinophils Absolute: 0.1 10*3/uL (ref 0.0–0.5)
HEMATOCRIT: 23.8 % — AB (ref 34.8–46.6)
HEMOGLOBIN: 5.7 g/dL — AB (ref 11.6–15.9)
LYMPH#: 0.5 10*3/uL — AB (ref 0.9–3.3)
LYMPH%: 14.3 % (ref 14.0–48.0)
MCH: 16.1 pg — ABNORMAL LOW (ref 26.0–34.0)
MCHC: 23.9 g/dL — ABNORMAL LOW (ref 32.0–36.0)
MCV: 67 fL — ABNORMAL LOW (ref 81–101)
MONO#: 0.4 10*3/uL (ref 0.1–0.9)
MONO%: 11.9 % (ref 0.0–13.0)
NEUT#: 2.6 10*3/uL (ref 1.5–6.5)
NEUT%: 70 % (ref 39.6–80.0)
Platelets: 167 10*3/uL (ref 145–400)
RBC: 3.53 10*6/uL — ABNORMAL LOW (ref 3.70–5.32)
RDW: 22.2 % — ABNORMAL HIGH (ref 11.1–15.7)
WBC: 3.7 10*3/uL — AB (ref 3.9–10.0)

## 2014-02-25 ENCOUNTER — Other Ambulatory Visit: Payer: Self-pay | Admitting: *Deleted

## 2014-02-25 DIAGNOSIS — M797 Fibromyalgia: Secondary | ICD-10-CM

## 2014-02-25 DIAGNOSIS — D45 Polycythemia vera: Secondary | ICD-10-CM

## 2014-02-25 MED ORDER — OXYCODONE HCL ER 10 MG PO T12A: EXTENDED_RELEASE_TABLET | ORAL | Status: DC

## 2014-02-25 MED ORDER — OXYCODONE HCL 15 MG PO TABS: ORAL_TABLET | ORAL | Status: DC

## 2014-02-25 MED ORDER — OXYCODONE HCL ER 20 MG PO T12A: EXTENDED_RELEASE_TABLET | ORAL | Status: DC

## 2014-02-27 ENCOUNTER — Telehealth: Payer: Self-pay | Admitting: *Deleted

## 2014-02-27 ENCOUNTER — Other Ambulatory Visit (HOSPITAL_BASED_OUTPATIENT_CLINIC_OR_DEPARTMENT_OTHER): Payer: Medicare Other | Admitting: Lab

## 2014-02-27 DIAGNOSIS — D45 Polycythemia vera: Secondary | ICD-10-CM

## 2014-02-27 DIAGNOSIS — I358 Other nonrheumatic aortic valve disorders: Secondary | ICD-10-CM

## 2014-02-27 DIAGNOSIS — E039 Hypothyroidism, unspecified: Secondary | ICD-10-CM

## 2014-02-27 LAB — CBC WITH DIFFERENTIAL (CANCER CENTER ONLY)
BASO#: 0 10*3/uL (ref 0.0–0.2)
BASO%: 0.5 % (ref 0.0–2.0)
EOS%: 4.9 % (ref 0.0–7.0)
Eosinophils Absolute: 0.2 10*3/uL (ref 0.0–0.5)
HEMATOCRIT: 25.5 % — AB (ref 34.8–46.6)
HEMOGLOBIN: 6.2 g/dL — AB (ref 11.6–15.9)
LYMPH#: 0.8 10*3/uL — ABNORMAL LOW (ref 0.9–3.3)
LYMPH%: 18.2 % (ref 14.0–48.0)
MCH: 16.4 pg — AB (ref 26.0–34.0)
MCHC: 24.3 g/dL — ABNORMAL LOW (ref 32.0–36.0)
MCV: 67 fL — AB (ref 81–101)
MONO#: 0.6 10*3/uL (ref 0.1–0.9)
MONO%: 13.8 % — AB (ref 0.0–13.0)
NEUT#: 2.7 10*3/uL (ref 1.5–6.5)
NEUT%: 62.6 % (ref 39.6–80.0)
Platelets: 171 10*3/uL (ref 145–400)
RBC: 3.79 10*6/uL (ref 3.70–5.32)
RDW: 22.4 % — ABNORMAL HIGH (ref 11.1–15.7)
WBC: 4.3 10*3/uL (ref 3.9–10.0)

## 2014-02-27 NOTE — Telephone Encounter (Signed)
New Message  Called about Flu Shot information; pt never picked up and vm was not set up

## 2014-03-01 ENCOUNTER — Ambulatory Visit (HOSPITAL_BASED_OUTPATIENT_CLINIC_OR_DEPARTMENT_OTHER): Payer: Medicare Other

## 2014-03-01 VITALS — BP 122/67 | HR 70 | Temp 98.0°F | Resp 18

## 2014-03-01 DIAGNOSIS — D45 Polycythemia vera: Secondary | ICD-10-CM

## 2014-03-01 NOTE — Progress Notes (Signed)
[name] presents today for phlebotomy per MD orders. Phlebotomy procedure started at 1235 and ended at 1245. 500 grams removed. Patient observed for 30 minutes after procedure without any incident. Patient tolerated procedure well. IV needle removed intact.

## 2014-03-01 NOTE — Patient Instructions (Signed)

## 2014-03-13 ENCOUNTER — Other Ambulatory Visit (HOSPITAL_BASED_OUTPATIENT_CLINIC_OR_DEPARTMENT_OTHER): Payer: Medicare Other | Admitting: Lab

## 2014-03-13 DIAGNOSIS — D45 Polycythemia vera: Secondary | ICD-10-CM

## 2014-03-13 DIAGNOSIS — I82492 Acute embolism and thrombosis of other specified deep vein of left lower extremity: Secondary | ICD-10-CM

## 2014-03-13 DIAGNOSIS — E039 Hypothyroidism, unspecified: Secondary | ICD-10-CM

## 2014-03-13 DIAGNOSIS — I358 Other nonrheumatic aortic valve disorders: Secondary | ICD-10-CM

## 2014-03-13 LAB — CBC WITH DIFFERENTIAL (CANCER CENTER ONLY)
BASO#: 0 10*3/uL (ref 0.0–0.2)
BASO%: 0.4 % (ref 0.0–2.0)
EOS%: 13.8 % — AB (ref 0.0–7.0)
Eosinophils Absolute: 0.8 10*3/uL — ABNORMAL HIGH (ref 0.0–0.5)
HCT: 24.4 % — ABNORMAL LOW (ref 34.8–46.6)
HEMOGLOBIN: 6 g/dL — AB (ref 11.6–15.9)
LYMPH#: 0.8 10*3/uL — AB (ref 0.9–3.3)
LYMPH%: 13.8 % — ABNORMAL LOW (ref 14.0–48.0)
MCH: 16.4 pg — ABNORMAL LOW (ref 26.0–34.0)
MCHC: 24.6 g/dL — AB (ref 32.0–36.0)
MCV: 67 fL — ABNORMAL LOW (ref 81–101)
MONO#: 0.7 10*3/uL (ref 0.1–0.9)
MONO%: 11.8 % (ref 0.0–13.0)
NEUT%: 60.2 % (ref 39.6–80.0)
NEUTROS ABS: 3.3 10*3/uL (ref 1.5–6.5)
Platelets: 200 10*3/uL (ref 145–400)
RBC: 3.65 10*6/uL — ABNORMAL LOW (ref 3.70–5.32)
RDW: 21.5 % — AB (ref 11.1–15.7)
WBC: 5.5 10*3/uL (ref 3.9–10.0)

## 2014-03-14 ENCOUNTER — Encounter: Payer: Self-pay | Admitting: Cardiology

## 2014-03-14 ENCOUNTER — Ambulatory Visit (INDEPENDENT_AMBULATORY_CARE_PROVIDER_SITE_OTHER): Payer: Medicare Other | Admitting: Cardiology

## 2014-03-14 VITALS — BP 126/64 | HR 83 | Ht 64.0 in | Wt 163.0 lb

## 2014-03-14 DIAGNOSIS — I119 Hypertensive heart disease without heart failure: Secondary | ICD-10-CM | POA: Diagnosis not present

## 2014-03-14 DIAGNOSIS — K219 Gastro-esophageal reflux disease without esophagitis: Secondary | ICD-10-CM

## 2014-03-14 DIAGNOSIS — J0110 Acute frontal sinusitis, unspecified: Secondary | ICD-10-CM | POA: Diagnosis not present

## 2014-03-14 DIAGNOSIS — J40 Bronchitis, not specified as acute or chronic: Secondary | ICD-10-CM | POA: Diagnosis not present

## 2014-03-14 DIAGNOSIS — D45 Polycythemia vera: Secondary | ICD-10-CM

## 2014-03-14 DIAGNOSIS — I6529 Occlusion and stenosis of unspecified carotid artery: Secondary | ICD-10-CM | POA: Diagnosis not present

## 2014-03-14 DIAGNOSIS — Z9889 Other specified postprocedural states: Secondary | ICD-10-CM

## 2014-03-14 MED ORDER — CIPROFLOXACIN HCL 500 MG PO TABS: 500.0000 mg | ORAL_TABLET | Freq: Two times a day (BID) | ORAL | Status: DC

## 2014-03-14 NOTE — Assessment & Plan Note (Signed)
The patient has had symptoms of sinusitis and bronchitis.  We refilled her Cipro 500 mg twice a day

## 2014-03-14 NOTE — Assessment & Plan Note (Signed)
The patient has had no recurrent TIA or stroke symptoms.

## 2014-03-14 NOTE — Patient Instructions (Addendum)
Your physician wants you to follow-up in: 4 Months with Dr. Mare Ferrari. You will receive a reminder letter in the mail two months in advance. If you don't receive a letter, please call our office to schedule the follow-up appointment.  Dr. Mare Ferrari has refilled your Cipro 500 mg

## 2014-03-14 NOTE — Progress Notes (Signed)
Fabio Pierce Date of Birth:  09-Aug-1945 [facility] [address] [place], [place]  [address] [phone]        Fax   [phone]   History of Present Illness: This pleasant 68 year old Caucasian female is seen for a four month followup office visit. She has a history of polycythemia vera and a history of hypothyroidism. She has a history of aortic valve sclerosis and essential hypertension. She has atypical chest pain and does not have any history of ischemic heart disease. She has a past history of PVCs.  The patient is status post cholecystectomy. Dr. Hulen Skains is her surgeon. 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 She has had a recent left carotid endarterectomy. She had carotid duplex ultrasound on 11/07/13 which showed 40-59% flow reduction in both arteries. She was readmitted to [facility] with some type of atypical focal seizure and is now on Keppra. She does not have any history of ischemic heart disease. She had an echocardiogram 10/04/12 showing ejection fraction 65-70% with grade 1 diastolic dysfunction. There was elevated pulmonary artery pressure of 53 mm mercury.  Since last visit she's had no new cardiac symptoms.  She has had no seizures.  She remains on Keppra.     Allergies  Allergen Reactions  . Ethrane [Enflurane] Nausea And Vomiting  . Codeine Nausea Only  . Synthroid [Levothyroxine Sodium] Other (See Comments)    Not effective. Causes excessive sleepiness. Takes armour thyroid  . Calcium Channel Blockers Palpitations  . Penicillins Rash    Patient Active Problem List   Diagnosis Date Noted  . Low libido 09/17/2013  . Aftercare following surgery of the circulatory system, [place] 05/10/2013  . Protein-calorie malnutrition, severe 12/29/2012  . Nausea and vomiting 12/28/2012  . Anemia 12/28/2012  . Dehydration 12/28/2012  . Hypotension 12/28/2012  . Status post cholecystectomy 12/28/2012  .  Occlusion and stenosis of carotid artery with cerebral infarction 12/21/2012  . Seizures 12/21/2012  . Postop check 12/19/2012  . Occlusion and stenosis of carotid artery without mention of cerebral infarction 10/26/2012  . Cholecystostomy tube dysfunction 10/24/2012  . 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 Acute cholecystitis 10/07/2012  . CVA (cerebral infarction) 10/05/2012  . Septic shock 10/02/2012  . Cholelithiases 09/30/2012  . Unspecified constipation 09/30/2012  . Bronchitis 03/08/2011  . Benign hypertensive heart disease without heart failure 09/28/2010  . Aortic valve sclerosis 09/28/2010  . Hypothyroidism 09/28/2010  . Polycythemia vera 09/28/2010  . Postmenopausal state 09/28/2010  . Fibromyalgia 09/28/2010    History  Smoking status  . Never Smoker   Smokeless tobacco  . Never Used    Comment: never used tobacco    History  Alcohol Use No    Family History  Problem Relation Age of Onset  . Heart attack Father   . Heart disease Father     Heart Disease before age 44  . Coronary artery disease Mother     had aortic valve replacement  . Heart disease Mother   . Heart attack Mother   . Hyperlipidemia Sister   . Hypertension Sister     Review of Systems: Constitutional: no fever chills diaphoresis or fatigue or change in weight.  Head and neck: no hearing loss, no epistaxis, no photophobia or visual disturbance. Respiratory: No cough, shortness of breath or wheezing. Cardiovascular: No chest pain peripheral edema, palpitations. Gastrointestinal: No abdominal distention, no abdominal pain, no change in bowel  habits hematochezia or melena. Genitourinary: No dysuria, no frequency, no urgency, no nocturia. Musculoskeletal:No arthralgias, no back pain, no gait disturbance or myalgias. Neurological: No dizziness, no headaches, no numbness, no seizures, no syncope, no weakness, no tremors. Hematologic: No lymphadenopathy, no easy  bruising. Psychiatric: No confusion, no hallucinations, no sleep disturbance.    Physical Exam: Filed Vitals:   03/14/14 1343  BP: 126/64  Pulse: 83   the general appearance is that of a well-developed well-nourished woman in no distress.  She has significant chronic alopecia.The head and neck exam reveals pupils equal and reactive.  Extraocular movements are full.  There is no scleral icterus.  The mouth and pharynx are normal.  The neck is supple.  The carotids reveal no bruits.  The jugular venous pressure is normal.  The  thyroid is not enlarged.  There is no lymphadenopathy.  The chest is clear to percussion and auscultation.  There are no rales or rhonchi.  Expansion of the chest is symmetrical.  The precordium is quiet.  The first heart sound is normal.  The second heart sound is physiologically split.  There is no murmur gallop rub or click.  There is no abnormal lift or heave.  The abdomen is soft and nontender.  The bowel sounds are normal.  The liver and spleen are not enlarged.  There are no abdominal masses.  There are no abdominal bruits.  Extremities reveal good pedal pulses.  There is no phlebitis or edema.  There is no cyanosis or clubbing.  Strength is normal and symmetrical in all extremities.  There is no lateralizing weakness.  There are no sensory deficits.  The skin is warm and dry.  There is no rash.  EKG today shows normal sinus rhythm and no PVCs and is within normal limits.   Assessment / Plan: 1. essential hypertension 2. polycythemia rubra vera 3. status post cholecystectomy 4. old stroke with history of left carotid stenosis 5. seizure disorder 6. history of recurrent sinusitis 7. GERD 8.  Past history of frequent PVCs   Plan: Continue current medication.  Recheck in 4 months for office visit.

## 2014-03-14 NOTE — Assessment & Plan Note (Signed)
Blood pressure has been so stable on current therapy.  He has not been expressing any chest pain of a cardiac type.

## 2014-03-15 ENCOUNTER — Ambulatory Visit (HOSPITAL_BASED_OUTPATIENT_CLINIC_OR_DEPARTMENT_OTHER): Payer: Medicare Other

## 2014-03-15 ENCOUNTER — Other Ambulatory Visit: Payer: Self-pay | Admitting: Cardiology

## 2014-03-15 DIAGNOSIS — D45 Polycythemia vera: Secondary | ICD-10-CM | POA: Diagnosis not present

## 2014-03-15 NOTE — Progress Notes (Signed)
[name] presents today for phlebotomy per MD orders. Phlebotomy procedure started at 1202 and ended at 1215 500 grams removed. Patient observed for 30 minutes after procedure without any incident. Patient tolerated procedure well. IV needle removed intact.

## 2014-03-15 NOTE — Patient Instructions (Signed)

## 2014-03-27 ENCOUNTER — Encounter: Payer: Self-pay | Admitting: Hematology & Oncology

## 2014-03-27 ENCOUNTER — Other Ambulatory Visit (HOSPITAL_BASED_OUTPATIENT_CLINIC_OR_DEPARTMENT_OTHER): Payer: Medicare Other | Admitting: Lab

## 2014-03-27 ENCOUNTER — Ambulatory Visit (HOSPITAL_BASED_OUTPATIENT_CLINIC_OR_DEPARTMENT_OTHER): Payer: Medicare Other | Admitting: Hematology & Oncology

## 2014-03-27 VITALS — BP 124/47 | HR 81 | Temp 97.7°F | Resp 14 | Ht 64.0 in | Wt 163.0 lb

## 2014-03-27 DIAGNOSIS — D45 Polycythemia vera: Secondary | ICD-10-CM

## 2014-03-27 DIAGNOSIS — E611 Iron deficiency: Secondary | ICD-10-CM

## 2014-03-27 DIAGNOSIS — M797 Fibromyalgia: Secondary | ICD-10-CM

## 2014-03-27 LAB — CMP (CANCER CENTER ONLY)
ALT: 12 U/L (ref 10–47)
AST: 17 U/L (ref 11–38)
Albumin: 3.4 g/dL (ref 3.3–5.5)
Alkaline Phosphatase: 78 U/L (ref 26–84)
BUN, Bld: 10 mg/dL (ref 7–22)
CALCIUM: 9.2 mg/dL (ref 8.0–10.3)
CHLORIDE: 98 meq/L (ref 98–108)
CO2: 31 meq/L (ref 18–33)
CREATININE: 0.7 mg/dL (ref 0.6–1.2)
Glucose, Bld: 132 mg/dL — ABNORMAL HIGH (ref 73–118)
Potassium: 3.6 mEq/L (ref 3.3–4.7)
SODIUM: 143 meq/L (ref 128–145)
Total Bilirubin: 0.6 mg/dl (ref 0.20–1.60)
Total Protein: 7 g/dL (ref 6.4–8.1)

## 2014-03-27 LAB — CBC WITH DIFFERENTIAL (CANCER CENTER ONLY)
BASO#: 0 10*3/uL (ref 0.0–0.2)
BASO%: 0.2 % (ref 0.0–2.0)
EOS ABS: 0.6 10*3/uL — AB (ref 0.0–0.5)
EOS%: 12.2 % — ABNORMAL HIGH (ref 0.0–7.0)
HCT: 21.9 % — ABNORMAL LOW (ref 34.8–46.6)
HGB: 5.3 g/dL — CL (ref 11.6–15.9)
LYMPH#: 0.8 10*3/uL — AB (ref 0.9–3.3)
LYMPH%: 16.2 % (ref 14.0–48.0)
MCH: 16 pg — ABNORMAL LOW (ref 26.0–34.0)
MCHC: 24.2 g/dL — ABNORMAL LOW (ref 32.0–36.0)
MCV: 66 fL — ABNORMAL LOW (ref 81–101)
MONO#: 0.6 10*3/uL (ref 0.1–0.9)
MONO%: 11.6 % (ref 0.0–13.0)
NEUT#: 2.8 10*3/uL (ref 1.5–6.5)
NEUT%: 59.8 % (ref 39.6–80.0)
Platelets: 208 10*3/uL (ref 145–400)
RBC: 3.31 10*6/uL — AB (ref 3.70–5.32)
RDW: 20.9 % — AB (ref 11.1–15.7)
WBC: 4.7 10*3/uL (ref 3.9–10.0)

## 2014-03-27 MED ORDER — OXYCODONE HCL ER 10 MG PO T12A: EXTENDED_RELEASE_TABLET | ORAL | Status: DC

## 2014-03-27 MED ORDER — OXYCODONE HCL 15 MG PO TABS: ORAL_TABLET | ORAL | Status: DC

## 2014-03-27 MED ORDER — OXYCODONE HCL ER 20 MG PO T12A: EXTENDED_RELEASE_TABLET | ORAL | Status: DC

## 2014-03-27 NOTE — Progress Notes (Signed)
Hematology and Oncology Follow Up Visit  [name] [id number] 04/19/1945 68 y.o. 03/27/2014   Principle Diagnosis:  Polycythemia vera -- hyperviscosity variant. 2. 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  Current Therapy:    Phlebotomy to maintain hemoglobin below 6.0     Interim History:  [name] is back for followup. Is doing okay. Her fibromyalgia has been acting up.  She gets her blood checked every couple weeks. She was phlebotomized 2 weeks ago.  She's had no problems with cough. There is some abdominal pain. I suspect that this probably is from which she had asked surgery. She had a necrotic gallbladder. I suspect that she may have some adhesions. She has not had any nausea or vomiting. She is still eating fairly well.  She's having issues with hot flashes. Her gynecologist she's not want put her on any kind of estrogen because of her past history of a CVA. I will defer any menopausal symptom management to her gynecologist.  Her skin has been doing pretty well. She has not had a count of rashes.. She's had no issues with bowels or bladder. She is profoundly iron deficient from her phlebotomies. Medications:Allergies:  Allergies  Allergen Reactions  . Ethrane [Enflurane] Nausea And Vomiting  . Codeine Nausea Only  . Synthroid [Levothyroxine Sodium] Other (See Comments)    Not effective. Causes excessive sleepiness. Takes armour thyroid  . Calcium Channel Blockers Palpitations  . Penicillins Rash    Past Medical History, Surgical history, Social history, and Family History were reviewed and updated.  Review of Systems: As above  Physical Exam:  height is 5\' 4"  (1.626 m) and weight is 163 lb (73.936 kg). Her oral temperature is 97.7 F (36.5 C). Her blood pressure is 124/47 and her pulse is 81. Her respiration is 14.   Well-developed and well-nourished white female in no obvious distress. Head and neck exam shows pale  conjunctiva. There is no oral lesions. There is no adenopathy in the neck. Lungs are clear bilaterally. Cardiac exam regular rate and rhythm with a 1/6 systolic ejection murmur. Abdomen is soft. Has good bowel sounds. There is no palpable liver or spleen tip appreciative well-healed laparotomy scars. Back exam no tenderness over the spine ribs or hips. Extremities shows no clubbing cyanosis or edema. Neurological exam shows no focal neurological deficits. Skin exam no rashes, ecchymoses or petechia.  Lab Results  Component Value Date   WBC 4.7 03/27/2014   HGB 5.3* 03/27/2014   HCT 21.9* 03/27/2014   MCV 66* 03/27/2014   PLT 208 03/27/2014     Chemistry      Component Value Date/Time   NA 143 03/27/2014 1336   NA 138 01/01/2014 1106   K 3.6 03/27/2014 1336   K 3.7 01/01/2014 1106   CL 98 03/27/2014 1336   CL 100 01/01/2014 1106   CO2 31 03/27/2014 1336   CO2 31 01/01/2014 1106   BUN 10 03/27/2014 1336   BUN 9 01/01/2014 1106   CREATININE 0.7 03/27/2014 1336   CREATININE 0.84 01/01/2014 1106      Component Value Date/Time   CALCIUM 9.2 03/27/2014 1336   CALCIUM 8.9 01/01/2014 1106   ALKPHOS 78 03/27/2014 1336   ALKPHOS 74 01/01/2014 1106   AST 17 03/27/2014 1336   AST 13 01/01/2014 1106   ALT 12 03/27/2014 1336   ALT <8 01/01/2014 1106   BILITOT 0.60 03/27/2014 1336   BILITOT 0.5 01/01/2014 1106  Impression and Plan: [name] is 68 year old female with polycythemia vera. She is hyperviscous. She does very well with her hemoglobin below 6.  We will continue to check her blood every 2 weeks. I suspect that she probably will need to have her next phlebotomy in 2 weeks.  She saw her cardiologist recently. He feels that she has been doing quite well.  I will see her back in 2 more months.  Volanda Napoleon, MD 12/30/20155:49 PM

## 2014-03-28 LAB — IRON AND TIBC CHCC
%SAT: 3 % — AB (ref 21–57)
IRON: 13 ug/dL — AB (ref 41–142)
TIBC: 505 ug/dL — ABNORMAL HIGH (ref 236–444)
UIBC: 492 ug/dL — ABNORMAL HIGH (ref 120–384)

## 2014-03-28 LAB — FERRITIN CHCC

## 2014-04-10 ENCOUNTER — Other Ambulatory Visit (HOSPITAL_BASED_OUTPATIENT_CLINIC_OR_DEPARTMENT_OTHER): Payer: Medicare Other | Admitting: Lab

## 2014-04-10 DIAGNOSIS — D45 Polycythemia vera: Secondary | ICD-10-CM | POA: Diagnosis not present

## 2014-04-10 DIAGNOSIS — E039 Hypothyroidism, unspecified: Secondary | ICD-10-CM

## 2014-04-10 DIAGNOSIS — I358 Other nonrheumatic aortic valve disorders: Secondary | ICD-10-CM

## 2014-04-10 LAB — CBC WITH DIFFERENTIAL (CANCER CENTER ONLY)
BASO#: 0 10*3/uL (ref 0.0–0.2)
BASO%: 0.2 % (ref 0.0–2.0)
EOS%: 18.9 % — ABNORMAL HIGH (ref 0.0–7.0)
Eosinophils Absolute: 0.8 10*3/uL — ABNORMAL HIGH (ref 0.0–0.5)
HCT: 22 % — ABNORMAL LOW (ref 34.8–46.6)
HEMOGLOBIN: 5.2 g/dL — AB (ref 11.6–15.9)
LYMPH#: 0.7 10*3/uL — AB (ref 0.9–3.3)
LYMPH%: 17.9 % (ref 14.0–48.0)
MCH: 15.4 pg — AB (ref 26.0–34.0)
MCHC: 23.6 g/dL — ABNORMAL LOW (ref 32.0–36.0)
MCV: 65 fL — ABNORMAL LOW (ref 81–101)
MONO#: 0.5 10*3/uL (ref 0.1–0.9)
MONO%: 12.3 % (ref 0.0–13.0)
NEUT%: 50.7 % (ref 39.6–80.0)
NEUTROS ABS: 2.1 10*3/uL (ref 1.5–6.5)
Platelets: 153 10*3/uL (ref 145–400)
RBC: 3.37 10*6/uL — AB (ref 3.70–5.32)
RDW: 21.1 % — ABNORMAL HIGH (ref 11.1–15.7)
WBC: 4.1 10*3/uL (ref 3.9–10.0)

## 2014-04-22 ENCOUNTER — Other Ambulatory Visit: Payer: Self-pay | Admitting: *Deleted

## 2014-04-22 DIAGNOSIS — D45 Polycythemia vera: Secondary | ICD-10-CM

## 2014-04-22 DIAGNOSIS — M797 Fibromyalgia: Secondary | ICD-10-CM

## 2014-04-22 MED ORDER — OXYCODONE HCL ER 20 MG PO T12A: EXTENDED_RELEASE_TABLET | ORAL | Status: DC

## 2014-04-22 MED ORDER — OXYCODONE HCL 15 MG PO TABS: ORAL_TABLET | ORAL | Status: DC

## 2014-04-22 MED ORDER — OXYCODONE HCL ER 10 MG PO T12A: EXTENDED_RELEASE_TABLET | ORAL | Status: DC

## 2014-04-24 ENCOUNTER — Other Ambulatory Visit (HOSPITAL_BASED_OUTPATIENT_CLINIC_OR_DEPARTMENT_OTHER): Payer: Medicare Other | Admitting: Lab

## 2014-04-24 DIAGNOSIS — D45 Polycythemia vera: Secondary | ICD-10-CM

## 2014-04-24 DIAGNOSIS — I358 Other nonrheumatic aortic valve disorders: Secondary | ICD-10-CM

## 2014-04-24 DIAGNOSIS — E039 Hypothyroidism, unspecified: Secondary | ICD-10-CM

## 2014-04-24 LAB — CBC WITH DIFFERENTIAL (CANCER CENTER ONLY)
BASO#: 0 10*3/uL (ref 0.0–0.2)
BASO%: 0.2 % (ref 0.0–2.0)
EOS%: 16.6 % — AB (ref 0.0–7.0)
Eosinophils Absolute: 0.8 10*3/uL — ABNORMAL HIGH (ref 0.0–0.5)
HCT: 23.6 % — ABNORMAL LOW (ref 34.8–46.6)
HEMOGLOBIN: 5.5 g/dL — AB (ref 11.6–15.9)
LYMPH#: 0.5 10*3/uL — ABNORMAL LOW (ref 0.9–3.3)
LYMPH%: 11.6 % — ABNORMAL LOW (ref 14.0–48.0)
MCH: 14.9 pg — ABNORMAL LOW (ref 26.0–34.0)
MCHC: 23.3 g/dL — ABNORMAL LOW (ref 32.0–36.0)
MCV: 64 fL — ABNORMAL LOW (ref 81–101)
MONO#: 0.5 10*3/uL (ref 0.1–0.9)
MONO%: 11 % (ref 0.0–13.0)
NEUT#: 2.8 10*3/uL (ref 1.5–6.5)
NEUT%: 60.6 % (ref 39.6–80.0)
Platelets: 186 10*3/uL (ref 145–400)
RBC: 3.68 10*6/uL — ABNORMAL LOW (ref 3.70–5.32)
RDW: 21.1 % — AB (ref 11.1–15.7)
WBC: 4.7 10*3/uL (ref 3.9–10.0)

## 2014-05-05 ENCOUNTER — Other Ambulatory Visit: Payer: Self-pay | Admitting: Cardiology

## 2014-05-08 ENCOUNTER — Ambulatory Visit: Payer: Medicare Other | Admitting: Hematology & Oncology

## 2014-05-08 ENCOUNTER — Other Ambulatory Visit: Payer: Medicare Other | Admitting: Lab

## 2014-05-08 ENCOUNTER — Other Ambulatory Visit (HOSPITAL_BASED_OUTPATIENT_CLINIC_OR_DEPARTMENT_OTHER): Payer: Medicare Other | Admitting: Lab

## 2014-05-08 DIAGNOSIS — I6523 Occlusion and stenosis of bilateral carotid arteries: Secondary | ICD-10-CM

## 2014-05-08 DIAGNOSIS — I358 Other nonrheumatic aortic valve disorders: Secondary | ICD-10-CM

## 2014-05-08 DIAGNOSIS — Z48812 Encounter for surgical aftercare following surgery on the circulatory system: Secondary | ICD-10-CM

## 2014-05-08 DIAGNOSIS — D45 Polycythemia vera: Secondary | ICD-10-CM

## 2014-05-08 DIAGNOSIS — E039 Hypothyroidism, unspecified: Secondary | ICD-10-CM

## 2014-05-08 LAB — CBC WITH DIFFERENTIAL (CANCER CENTER ONLY)
BASO#: 0 10*3/uL (ref 0.0–0.2)
BASO%: 0 % (ref 0.0–2.0)
EOS%: 6.6 % (ref 0.0–7.0)
Eosinophils Absolute: 0.3 10*3/uL (ref 0.0–0.5)
HEMATOCRIT: 24.2 % — AB (ref 34.8–46.6)
HGB: 5.7 g/dL — CL (ref 11.6–15.9)
LYMPH#: 0.8 10*3/uL — ABNORMAL LOW (ref 0.9–3.3)
LYMPH%: 15.2 % (ref 14.0–48.0)
MCH: 15 pg — ABNORMAL LOW (ref 26.0–34.0)
MCHC: 23.6 g/dL — ABNORMAL LOW (ref 32.0–36.0)
MCV: 64 fL — AB (ref 81–101)
MONO#: 0.7 10*3/uL (ref 0.1–0.9)
MONO%: 12.8 % (ref 0.0–13.0)
NEUT#: 3.4 10*3/uL (ref 1.5–6.5)
NEUT%: 65.4 % (ref 39.6–80.0)
Platelets: 171 10*3/uL (ref 145–400)
RBC: 3.79 10*6/uL (ref 3.70–5.32)
RDW: 21.8 % — ABNORMAL HIGH (ref 11.1–15.7)
WBC: 5.1 10*3/uL (ref 3.9–10.0)

## 2014-05-10 ENCOUNTER — Other Ambulatory Visit: Payer: Self-pay | Admitting: Cardiology

## 2014-05-13 NOTE — Telephone Encounter (Signed)
Okay to refill cipro

## 2014-05-15 ENCOUNTER — Other Ambulatory Visit: Payer: Self-pay | Admitting: Hematology & Oncology

## 2014-05-20 ENCOUNTER — Telehealth: Payer: Self-pay | Admitting: Cardiology

## 2014-05-20 NOTE — Telephone Encounter (Signed)
Error

## 2014-05-22 ENCOUNTER — Other Ambulatory Visit: Payer: Self-pay | Admitting: *Deleted

## 2014-05-22 ENCOUNTER — Encounter: Payer: Self-pay | Admitting: Hematology & Oncology

## 2014-05-22 ENCOUNTER — Ambulatory Visit (HOSPITAL_BASED_OUTPATIENT_CLINIC_OR_DEPARTMENT_OTHER): Payer: Medicare Other | Admitting: Hematology & Oncology

## 2014-05-22 ENCOUNTER — Other Ambulatory Visit (HOSPITAL_BASED_OUTPATIENT_CLINIC_OR_DEPARTMENT_OTHER): Payer: Medicare Other | Admitting: Lab

## 2014-05-22 ENCOUNTER — Ambulatory Visit (HOSPITAL_BASED_OUTPATIENT_CLINIC_OR_DEPARTMENT_OTHER)
Admission: RE | Admit: 2014-05-22 | Discharge: 2014-05-22 | Disposition: A | Discharge status: Home / Self Care | Payer: Medicare Other | Source: Ambulatory Visit | Attending: Hematology & Oncology | Admitting: Hematology & Oncology

## 2014-05-22 ENCOUNTER — Other Ambulatory Visit: Payer: Self-pay | Admitting: Hematology & Oncology

## 2014-05-22 VITALS — BP 146/59 | HR 89 | Temp 98.0°F | Resp 16 | Ht 64.0 in | Wt 162.0 lb

## 2014-05-22 DIAGNOSIS — M1712 Unilateral primary osteoarthritis, left knee: Secondary | ICD-10-CM | POA: Diagnosis not present

## 2014-05-22 DIAGNOSIS — M25561 Pain in right knee: Secondary | ICD-10-CM | POA: Insufficient documentation

## 2014-05-22 DIAGNOSIS — D45 Polycythemia vera: Secondary | ICD-10-CM

## 2014-05-22 DIAGNOSIS — M1711 Unilateral primary osteoarthritis, right knee: Secondary | ICD-10-CM | POA: Diagnosis not present

## 2014-05-22 DIAGNOSIS — M810 Age-related osteoporosis without current pathological fracture: Secondary | ICD-10-CM | POA: Insufficient documentation

## 2014-05-22 DIAGNOSIS — I119 Hypertensive heart disease without heart failure: Secondary | ICD-10-CM

## 2014-05-22 DIAGNOSIS — M25562 Pain in left knee: Secondary | ICD-10-CM | POA: Diagnosis not present

## 2014-05-22 DIAGNOSIS — M797 Fibromyalgia: Secondary | ICD-10-CM

## 2014-05-22 LAB — CBC WITH DIFFERENTIAL (CANCER CENTER ONLY)
BASO#: 0 10*3/uL (ref 0.0–0.2)
BASO%: 0.4 % (ref 0.0–2.0)
EOS%: 6.2 % (ref 0.0–7.0)
Eosinophils Absolute: 0.3 10*3/uL (ref 0.0–0.5)
HCT: 25.4 % — ABNORMAL LOW (ref 34.8–46.6)
HGB: 6.2 g/dL — CL (ref 11.6–15.9)
LYMPH#: 1 10*3/uL (ref 0.9–3.3)
LYMPH%: 21.1 % (ref 14.0–48.0)
MCH: 15.3 pg — ABNORMAL LOW (ref 26.0–34.0)
MCHC: 24.4 g/dL — ABNORMAL LOW (ref 32.0–36.0)
MCV: 63 fL — ABNORMAL LOW (ref 81–101)
MONO#: 0.5 10*3/uL (ref 0.1–0.9)
MONO%: 11.6 % (ref 0.0–13.0)
NEUT#: 2.7 10*3/uL (ref 1.5–6.5)
NEUT%: 60.7 % (ref 39.6–80.0)
Platelets: 213 10*3/uL (ref 145–400)
RBC: 4.06 10*6/uL (ref 3.70–5.32)
RDW: 22.1 % — ABNORMAL HIGH (ref 11.1–15.7)
WBC: 4.5 10*3/uL (ref 3.9–10.0)

## 2014-05-22 LAB — TECHNOLOGIST REVIEW CHCC SATELLITE

## 2014-05-22 LAB — CMP (CANCER CENTER ONLY)
ALK PHOS: 94 U/L — AB (ref 26–84)
ALT(SGPT): 12 U/L (ref 10–47)
AST: 23 U/L (ref 11–38)
Albumin: 3.6 g/dL (ref 3.3–5.5)
BILIRUBIN TOTAL: 0.7 mg/dL (ref 0.20–1.60)
BUN: 9 mg/dL (ref 7–22)
CHLORIDE: 97 meq/L — AB (ref 98–108)
CO2: 31 mEq/L (ref 18–33)
Calcium: 9.1 mg/dL (ref 8.0–10.3)
Creat: 0.8 mg/dl (ref 0.6–1.2)
GLUCOSE: 120 mg/dL — AB (ref 73–118)
POTASSIUM: 3.4 meq/L (ref 3.3–4.7)
Sodium: 143 mEq/L (ref 128–145)
Total Protein: 7.2 g/dL (ref 6.4–8.1)

## 2014-05-22 MED ORDER — OXYCODONE HCL ER 10 MG PO T12A: EXTENDED_RELEASE_TABLET | ORAL | Status: DC

## 2014-05-22 MED ORDER — OXYCODONE HCL ER 20 MG PO T12A: EXTENDED_RELEASE_TABLET | ORAL | Status: DC

## 2014-05-22 MED ORDER — OXYCODONE HCL 15 MG PO TABS: ORAL_TABLET | ORAL | Status: DC

## 2014-05-22 NOTE — Progress Notes (Signed)
Hematology and Oncology Follow Up Visit  ARLITA BUFFKIN [id number] Jul 14, 1945 69 y.o. 05/22/2014   Principle Diagnosis:  Polycythemia vera -- hyperviscosity variant. 2. 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  Current Therapy:    Phlebotomy to maintain hemoglobin below 6.0     Interim History:  Ms.  Palinkas is back for followup. Is doing okay. Her fibromyalgia has not been acting up. The weather really can affect her.  She gets her blood checked every couple weeks. She was phlebotomized about 6 weeks ago.  She's had no problems with cough. There is some abdominal pain. I suspect that this probably is from which she had asked surgery. She had a necrotic gallbladder. I suspect that she may have some adhesions. She has not had any nausea or vomiting. She is still eating fairly well.  She's having issues with hot flashes. Her gynecologist does not want put her on any kind of estrogen because of her past history of a CVA. I will defer any menopausal symptom management to her gynecologist.  Her skin has been doing pretty well. She has not had a problem with rashes.. However, she does have this lesion on her anterior abdominal wall. She has an area of vitiligo within the lesion. She will see her dermatologist for this.   She's had no issues with bowels or bladder. She is profoundly iron deficient from her phlebotomies. Medications:Allergies:  Allergies  Allergen Reactions  . Ethrane [Enflurane] Nausea And Vomiting  . Codeine Nausea Only  . Synthroid [Levothyroxine Sodium] Other (See Comments)    Not effective. Causes excessive sleepiness. Takes armour thyroid  . Calcium Channel Blockers Palpitations  . Penicillins Rash    Past Medical History, Surgical history, Social history, and Family History were reviewed and updated.  Review of Systems: As above  Physical Exam:  height is 5\' 4"  (1.626 m) and weight is 162 lb (73.483 kg). Her oral temperature  is 98 F (36.7 C). Her blood pressure is 146/59 and her pulse is 89. Her respiration is 16.   Well-developed and well-nourished white female in no obvious distress. Head and neck exam shows pale conjunctiva. There is no oral lesions. There is no adenopathy in the neck. Lungs are clear bilaterally. Cardiac exam regular rate and rhythm with a 1/6 systolic ejection murmur. Abdomen is soft. Has good bowel sounds. There is no palpable liver or spleen tip appreciative well-healed laparotomy scars. Back exam no tenderness over the spine ribs or hips. Extremities shows no clubbing cyanosis or edema. Neurological exam shows no focal neurological deficits. Skin exam no rashes, ecchymoses or petechia.  Lab Results  Component Value Date   WBC 4.5 05/22/2014   HGB 6.2* 05/22/2014   HCT 25.4* 05/22/2014   MCV 63* 05/22/2014   PLT 213 05/22/2014     Chemistry      Component Value Date/Time   NA 143 05/22/2014 1328   NA 138 01/01/2014 1106   K 3.4 05/22/2014 1328   K 3.7 01/01/2014 1106   CL 97* 05/22/2014 1328   CL 100 01/01/2014 1106   CO2 31 05/22/2014 1328   CO2 31 01/01/2014 1106   BUN 9 05/22/2014 1328   BUN 9 01/01/2014 1106   CREATININE 0.8 05/22/2014 1328   CREATININE 0.84 01/01/2014 1106      Component Value Date/Time   CALCIUM 9.1 05/22/2014 1328   CALCIUM 8.9 01/01/2014 1106   ALKPHOS 94* 05/22/2014 1328   ALKPHOS 74 01/01/2014  1106   AST 23 05/22/2014 1328   AST 13 01/01/2014 1106   ALT 12 05/22/2014 1328   ALT <8 01/01/2014 1106   BILITOT 0.70 05/22/2014 1328   BILITOT 0.5 01/01/2014 1106         Impression and Plan: Ms. Wellons is 69 year old female with polycythemia vera. She is hyperviscous. She does very well with her hemoglobin below 6. We will go ahead and phlebotomize her this week.  We will continue to check her blood every 2 weeks. .  She she will see her cardiologist in the near future. He typically does a very thorough job in examining her.  I will see her  back in 2 more months.  Volanda Napoleon, MD 2/24/20163:01 PM

## 2014-05-22 NOTE — Addendum Note (Signed)
Addended by: Volanda Napoleon on: 05/22/2014 03:13 PM   Modules accepted: Orders

## 2014-05-24 ENCOUNTER — Ambulatory Visit (HOSPITAL_BASED_OUTPATIENT_CLINIC_OR_DEPARTMENT_OTHER): Payer: Medicare Other

## 2014-05-24 DIAGNOSIS — D45 Polycythemia vera: Secondary | ICD-10-CM

## 2014-05-24 NOTE — Patient Instructions (Signed)

## 2014-05-24 NOTE — Progress Notes (Signed)
[name] presents today for phlebotomy per MD orders. Phlebotomy procedure started at 1120 and ended at 1130. 500 grams removed. Patient observed for 30 minutes after procedure without any incident. Patient tolerated procedure well. IV needle removed intact.

## 2014-05-27 ENCOUNTER — Telehealth: Payer: Self-pay | Admitting: Cardiology

## 2014-05-27 NOTE — Telephone Encounter (Signed)
Scheduled follow up ov for patient

## 2014-05-27 NOTE — Telephone Encounter (Signed)
New problem    Pt returning your call. Please call pt,

## 2014-05-30 ENCOUNTER — Other Ambulatory Visit: Payer: Self-pay | Admitting: Cardiology

## 2014-05-30 DIAGNOSIS — M791 Myalgia, unspecified site: Secondary | ICD-10-CM

## 2014-06-05 ENCOUNTER — Other Ambulatory Visit (HOSPITAL_BASED_OUTPATIENT_CLINIC_OR_DEPARTMENT_OTHER): Payer: Medicare Other | Admitting: Lab

## 2014-06-05 DIAGNOSIS — I119 Hypertensive heart disease without heart failure: Secondary | ICD-10-CM | POA: Diagnosis not present

## 2014-06-05 DIAGNOSIS — M25562 Pain in left knee: Secondary | ICD-10-CM | POA: Diagnosis not present

## 2014-06-05 DIAGNOSIS — D45 Polycythemia vera: Secondary | ICD-10-CM | POA: Diagnosis not present

## 2014-06-05 DIAGNOSIS — M25561 Pain in right knee: Secondary | ICD-10-CM

## 2014-06-05 LAB — CMP (CANCER CENTER ONLY)
ALK PHOS: 100 U/L — AB (ref 26–84)
ALT(SGPT): 9 U/L — ABNORMAL LOW (ref 10–47)
AST: 22 U/L (ref 11–38)
Albumin: 3.4 g/dL (ref 3.3–5.5)
BILIRUBIN TOTAL: 0.6 mg/dL (ref 0.20–1.60)
BUN, Bld: 6 mg/dL — ABNORMAL LOW (ref 7–22)
CALCIUM: 8.9 mg/dL (ref 8.0–10.3)
CHLORIDE: 102 meq/L (ref 98–108)
CO2: 32 mEq/L (ref 18–33)
Creat: 0.8 mg/dl (ref 0.6–1.2)
Glucose, Bld: 147 mg/dL — ABNORMAL HIGH (ref 73–118)
Potassium: 3.3 mEq/L (ref 3.3–4.7)
Sodium: 143 mEq/L (ref 128–145)
TOTAL PROTEIN: 6.9 g/dL (ref 6.4–8.1)

## 2014-06-05 LAB — CBC WITH DIFFERENTIAL (CANCER CENTER ONLY)
BASO#: 0 10*3/uL (ref 0.0–0.2)
BASO%: 0.3 % (ref 0.0–2.0)
EOS ABS: 0.3 10*3/uL (ref 0.0–0.5)
EOS%: 7.7 % — ABNORMAL HIGH (ref 0.0–7.0)
HEMATOCRIT: 22.2 % — AB (ref 34.8–46.6)
HGB: 5.3 g/dL — CL (ref 11.6–15.9)
LYMPH#: 0.7 10*3/uL — AB (ref 0.9–3.3)
LYMPH%: 20.1 % (ref 14.0–48.0)
MCH: 15.4 pg — AB (ref 26.0–34.0)
MCHC: 23.9 g/dL — ABNORMAL LOW (ref 32.0–36.0)
MCV: 64 fL — ABNORMAL LOW (ref 81–101)
MONO#: 0.4 10*3/uL (ref 0.1–0.9)
MONO%: 10.2 % (ref 0.0–13.0)
NEUT%: 61.7 % (ref 39.6–80.0)
NEUTROS ABS: 2.2 10*3/uL (ref 1.5–6.5)
Platelets: 173 10*3/uL (ref 145–400)
RBC: 3.45 10*6/uL — ABNORMAL LOW (ref 3.70–5.32)
RDW: 22.8 % — AB (ref 11.1–15.7)
WBC: 3.6 10*3/uL — ABNORMAL LOW (ref 3.9–10.0)

## 2014-06-17 ENCOUNTER — Other Ambulatory Visit: Payer: Self-pay | Admitting: Nurse Practitioner

## 2014-06-17 DIAGNOSIS — M797 Fibromyalgia: Secondary | ICD-10-CM

## 2014-06-17 DIAGNOSIS — D45 Polycythemia vera: Secondary | ICD-10-CM

## 2014-06-17 MED ORDER — OXYCODONE HCL ER 20 MG PO T12A: EXTENDED_RELEASE_TABLET | ORAL | Status: DC

## 2014-06-17 MED ORDER — OXYCODONE HCL ER 10 MG PO T12A: EXTENDED_RELEASE_TABLET | ORAL | Status: DC

## 2014-06-17 MED ORDER — OXYCODONE HCL 15 MG PO TABS: ORAL_TABLET | ORAL | Status: DC

## 2014-06-19 ENCOUNTER — Other Ambulatory Visit (HOSPITAL_BASED_OUTPATIENT_CLINIC_OR_DEPARTMENT_OTHER): Payer: Medicare Other | Admitting: Lab

## 2014-06-19 DIAGNOSIS — D45 Polycythemia vera: Secondary | ICD-10-CM | POA: Diagnosis not present

## 2014-06-19 DIAGNOSIS — E039 Hypothyroidism, unspecified: Secondary | ICD-10-CM

## 2014-06-19 DIAGNOSIS — I358 Other nonrheumatic aortic valve disorders: Secondary | ICD-10-CM

## 2014-06-19 LAB — CBC WITH DIFFERENTIAL (CANCER CENTER ONLY)
BASO#: 0 10*3/uL (ref 0.0–0.2)
BASO%: 0.3 % (ref 0.0–2.0)
EOS%: 7.5 % — ABNORMAL HIGH (ref 0.0–7.0)
Eosinophils Absolute: 0.3 10*3/uL (ref 0.0–0.5)
HCT: 23.4 % — ABNORMAL LOW (ref 34.8–46.6)
HEMOGLOBIN: 5.6 g/dL — AB (ref 11.6–15.9)
LYMPH#: 0.8 10*3/uL — ABNORMAL LOW (ref 0.9–3.3)
LYMPH%: 24.2 % (ref 14.0–48.0)
MCH: 15.4 pg — AB (ref 26.0–34.0)
MCHC: 23.9 g/dL — ABNORMAL LOW (ref 32.0–36.0)
MCV: 65 fL — ABNORMAL LOW (ref 81–101)
MONO#: 0.3 10*3/uL (ref 0.1–0.9)
MONO%: 10.1 % (ref 0.0–13.0)
NEUT#: 1.9 10*3/uL (ref 1.5–6.5)
NEUT%: 57.9 % (ref 39.6–80.0)
PLATELETS: 175 10*3/uL (ref 145–400)
RBC: 3.63 10*6/uL — ABNORMAL LOW (ref 3.70–5.32)
RDW: 22.8 % — ABNORMAL HIGH (ref 11.1–15.7)
WBC: 3.4 10*3/uL — ABNORMAL LOW (ref 3.9–10.0)

## 2014-06-24 ENCOUNTER — Other Ambulatory Visit: Payer: Self-pay | Admitting: Hematology & Oncology

## 2014-06-26 DIAGNOSIS — R6882 Decreased libido: Secondary | ICD-10-CM | POA: Diagnosis not present

## 2014-06-26 DIAGNOSIS — N951 Menopausal and female climacteric states: Secondary | ICD-10-CM | POA: Diagnosis not present

## 2014-06-26 DIAGNOSIS — Z1289 Encounter for screening for malignant neoplasm of other sites: Secondary | ICD-10-CM | POA: Diagnosis not present

## 2014-06-26 DIAGNOSIS — Z1212 Encounter for screening for malignant neoplasm of rectum: Secondary | ICD-10-CM | POA: Diagnosis not present

## 2014-07-02 DIAGNOSIS — D225 Melanocytic nevi of trunk: Secondary | ICD-10-CM | POA: Diagnosis not present

## 2014-07-02 DIAGNOSIS — Z08 Encounter for follow-up examination after completed treatment for malignant neoplasm: Secondary | ICD-10-CM | POA: Diagnosis not present

## 2014-07-02 DIAGNOSIS — L570 Actinic keratosis: Secondary | ICD-10-CM | POA: Diagnosis not present

## 2014-07-02 DIAGNOSIS — L821 Other seborrheic keratosis: Secondary | ICD-10-CM | POA: Diagnosis not present

## 2014-07-02 DIAGNOSIS — Z85820 Personal history of malignant melanoma of skin: Secondary | ICD-10-CM | POA: Diagnosis not present

## 2014-07-03 ENCOUNTER — Other Ambulatory Visit (HOSPITAL_BASED_OUTPATIENT_CLINIC_OR_DEPARTMENT_OTHER): Payer: Medicare Other

## 2014-07-03 DIAGNOSIS — E039 Hypothyroidism, unspecified: Secondary | ICD-10-CM | POA: Diagnosis not present

## 2014-07-03 DIAGNOSIS — D45 Polycythemia vera: Secondary | ICD-10-CM | POA: Diagnosis present

## 2014-07-03 DIAGNOSIS — I358 Other nonrheumatic aortic valve disorders: Secondary | ICD-10-CM

## 2014-07-03 LAB — CBC WITH DIFFERENTIAL (CANCER CENTER ONLY)
BASO#: 0 10*3/uL (ref 0.0–0.2)
BASO%: 0.5 % (ref 0.0–2.0)
EOS%: 11 % — ABNORMAL HIGH (ref 0.0–7.0)
Eosinophils Absolute: 0.5 10*3/uL (ref 0.0–0.5)
HCT: 26.9 % — ABNORMAL LOW (ref 34.8–46.6)
HGB: 6.5 g/dL — CL (ref 11.6–15.9)
LYMPH#: 1 10*3/uL (ref 0.9–3.3)
LYMPH%: 23.3 % (ref 14.0–48.0)
MCH: 16.8 pg — ABNORMAL LOW (ref 26.0–34.0)
MCHC: 24.2 g/dL — AB (ref 32.0–36.0)
MCV: 69 fL — ABNORMAL LOW (ref 81–101)
MONO#: 0.5 10*3/uL (ref 0.1–0.9)
MONO%: 12.6 % (ref 0.0–13.0)
NEUT%: 52.6 % (ref 39.6–80.0)
NEUTROS ABS: 2.3 10*3/uL (ref 1.5–6.5)
Platelets: 152 10*3/uL (ref 145–400)
RBC: 3.88 10*6/uL (ref 3.70–5.32)
RDW: 26.7 % — AB (ref 11.1–15.7)
WBC: 4.3 10*3/uL (ref 3.9–10.0)

## 2014-07-04 ENCOUNTER — Ambulatory Visit (HOSPITAL_BASED_OUTPATIENT_CLINIC_OR_DEPARTMENT_OTHER): Payer: Medicare Other

## 2014-07-04 DIAGNOSIS — D45 Polycythemia vera: Secondary | ICD-10-CM

## 2014-07-04 NOTE — Patient Instructions (Signed)

## 2014-07-04 NOTE — Progress Notes (Signed)
Fabio Pierce presents today for phlebotomy per MD orders. Phlebotomy procedure started at 1153 and ended at 1159 grams removed. Patient observed for 30 minutes after procedure without any incident. Patient tolerated procedure well. IV needle removed intact.

## 2014-07-05 ENCOUNTER — Encounter: Payer: Self-pay | Admitting: Cardiology

## 2014-07-05 ENCOUNTER — Ambulatory Visit (INDEPENDENT_AMBULATORY_CARE_PROVIDER_SITE_OTHER): Payer: Medicare Other | Admitting: Cardiology

## 2014-07-05 VITALS — BP 118/58 | HR 76 | Ht 62.5 in | Wt 169.8 lb

## 2014-07-05 DIAGNOSIS — K219 Gastro-esophageal reflux disease without esophagitis: Secondary | ICD-10-CM

## 2014-07-05 DIAGNOSIS — D45 Polycythemia vera: Secondary | ICD-10-CM

## 2014-07-05 DIAGNOSIS — I119 Hypertensive heart disease without heart failure: Secondary | ICD-10-CM

## 2014-07-05 DIAGNOSIS — I6523 Occlusion and stenosis of bilateral carotid arteries: Secondary | ICD-10-CM

## 2014-07-05 DIAGNOSIS — E038 Other specified hypothyroidism: Secondary | ICD-10-CM

## 2014-07-05 NOTE — Progress Notes (Signed)
Cardiology Office Note   Date:  07/05/2014   ID:  Hobbs, Sarah [birth year], MRN [id number]  PCP:  Darlin Coco, MD  Cardiologist:   Darlin Coco, MD   No chief complaint on file.     History of Present Illness: [name] is a 69 y.o. female who presents for a scheduled four-month follow-up office visit  This pleasant 69 year old Caucasian female is seen for a four month followup office visit. She has a history of polycythemia vera and a history of hypothyroidism. She has a history of aortic valve sclerosis and essential hypertension. She has atypical chest pain and does not have any history of ischemic heart disease. She has a past history of PVCs.  The patient is status post cholecystectomy. Dr. Hulen Skains is her surgeon. 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 She has had a recent left carotid endarterectomy. She had carotid duplex ultrasound on 11/07/13 which showed 40-59% flow reduction in both arteries. She was readmitted to [facility] with some type of atypical focal seizure and is now on Keppra. She does not have any history of ischemic heart disease. She had an echocardiogram 10/04/12 showing ejection fraction 65-70% with grade 1 diastolic dysfunction. There was elevated pulmonary artery pressure of 53 mm mercury. Since last visit she's had no new cardiac symptoms. She has had no seizures. She remains on Keppra. She denies any chest pain.  No awareness of recent PVCs or palpitations.  She is having less daytime somnolence since her thyroid hormone replacement was maintained at a higher level of 2.5 gr of Armour Thyroid daily.  She did not do well with a trial of the lesser dose.  Past Medical History  Diagnosis Date  . Fibromyalgia     chronic pain syndrome  . Postmenopausal state     on hormone replacement therapy  . Aortic valve sclerosis     by echocardiogram 12/04/2009  . Hyperlipidemia     "not since carotid  OR" (12/28/2012)  . 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 GERD (gastroesophageal reflux disease)   . PAC (premature atrial contraction)   . Chronic constipation     takes Mineral Oil,Juice,Enema(prn),and Miralax(Prn) and Cascara nightly  . Insomnia     takes restoril nightly and Xanax  . Hypothyroidism   . Seasonal allergies     takes Allegra in am and Benadryl at night  . Occlusion and stenosis of carotid artery without mention of cerebral infarction 10/26/2012  . 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 Acute cholecystitis 10/07/2012    s/p pec drain due to recent CVA, pending chole 11/2012  . Difficult intubation     grade 3 airway  . PONV (postoperative nausea and vomiting)     "I vomit for 5 days straight w/certain RX w/thanes" (12/28/2012)  . Hypertension     "not since carotid OR" (12/28/2012)  . Heart murmur     "flow mumur" (12/28/2012)  . Exertional dyspnea   . Asthma     "wheeze occasionally; I don't have asthma" (12/28/2012)  . Anemia     "due to polycythemia vera" (12/28/2012)  . History of blood transfusion 09/2012; 12/28/2012  . Seizures     only seizure was 10/14/12 "after carotid endarterectomy";takes Keppra daily  . Stroke 09/2012    "after they put drain in my gallbladder"; residual is "haven't felt well enough to tell  since stroke to tell; weak already; weaker when I'm tired" (12/28/2012)  . Chronic back pain   . Anxiety   . 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 Melanoma of chin 2001    Right chin          Allergies:   Ethrane; Codeine; Synthroid; Calcium channel blockers; and Penicillins    Social History:  The patient  reports that she has never smoked. She has never used smokeless tobacco. She reports that she does not drink alcohol or use illicit drugs.   Family History:  The patient's family history includes Coronary artery disease in her mother; Heart attack in her father and mother;  Heart disease in her father and mother; Hyperlipidemia in her sister; Hypertension in her sister.    ROS:  Please see the history of present illness.   Otherwise, review of systems are positive for none.   All other systems are reviewed and negative.    PHYSICAL EXAM: VS:  BP 118/58 mmHg  Pulse 76  Ht 5' 2.5" (1.588 m)  Wt 169 lb 12.8 oz (77.021 kg)  BMI 30.54 kg/m2 , BMI Body mass index is 30.54 kg/(m^2). GEN: Well nourished, well developed, in no acute distress.  Chronic pallor HEENT: normal Neck: no JVD, carotid bruits, or masses Cardiac: RRR; no murmurs, rubs, or gallops,no edema  Respiratory:  clear to auscultation bilaterally, normal work of breathing GI: soft, nontender, nondistended, + BS MS: no deformity or atrophy Skin: warm and dry, no rash Neuro:  Strength and sensation are intact Psych: euthymic mood, full affect   EKG:  EKG is not ordered today. The ekg ordered today demonstrates    Recent Labs: 06/05/2014: ALT 9; BUN 6; Creatinine 0.8; Potassium 3.3; Sodium 143 07/03/2014: Hemoglobin 6.5; Platelets 152    Lipid Panel    Component Value Date/Time   CHOL 58 10/07/2012 0700   TRIG 121 10/07/2012 0700   HDL 11* 10/07/2012 0700   CHOLHDL 5.3 10/07/2012 0700   VLDL 24 10/07/2012 0700   LDLCALC 23 10/07/2012 0700      Wt Readings from Last 3 Encounters:  07/05/14 169 lb 12.8 oz (77.021 kg)  05/22/14 162 lb (73.483 kg)  03/27/14 163 lb (73.936 kg)         ASSESSMENT AND PLAN:  1. essential hypertension 2. polycythemia rubra vera.  She had a phlebotomy yesterday.  She has a phlebotomy every 6 weeks if her hemoglobin goes above 6.0 3. status post cholecystectomy 4. old stroke with history of left carotid stenosis 5. seizure disorder 6. history of recurrent sinusitis 7. GERD 8. Past history of frequent PVCs 9.  Hypothyroid.  Requires brand name Armour thyroid 150 gr daily  Plan: Continue current medication. Recheck in 4 months for office  visit.   Current medicines are reviewed at length with the patient today.  The patient does not have concerns regarding medicines.  The following changes have been made:  no change  Labs/ tests ordered today include:  No orders of the defined types were placed in this encounter.    Continue current medication.  Recheck in 4 months for office visit and EKG   Signed, Darlin Coco, MD  07/05/2014 2:04 PM    [facility] [address], [place], [place]  [address] Phone: [phone]; Fax: [phone]

## 2014-07-05 NOTE — Patient Instructions (Signed)
Medication Instructions:  Your physician recommends that you continue on your current medications as directed. Please refer to the Current Medication list given to you today.  Labwork: NONE  Testing/Procedures: NONE  Follow-Up: Your physician wants you to follow-up in: [place] will receive a reminder letter in the mail two months in advance. If you don't receive a letter, please call our office to schedule the follow-up appointment.

## 2014-07-12 ENCOUNTER — Other Ambulatory Visit: Payer: Self-pay | Admitting: Cardiology

## 2014-07-12 DIAGNOSIS — G4700 Insomnia, unspecified: Secondary | ICD-10-CM

## 2014-07-17 ENCOUNTER — Ambulatory Visit (HOSPITAL_BASED_OUTPATIENT_CLINIC_OR_DEPARTMENT_OTHER): Payer: Medicare Other | Admitting: Hematology & Oncology

## 2014-07-17 ENCOUNTER — Encounter: Payer: Self-pay | Admitting: Hematology & Oncology

## 2014-07-17 ENCOUNTER — Other Ambulatory Visit (HOSPITAL_BASED_OUTPATIENT_CLINIC_OR_DEPARTMENT_OTHER): Payer: Medicare Other

## 2014-07-17 ENCOUNTER — Telehealth: Payer: Self-pay | Admitting: *Deleted

## 2014-07-17 VITALS — BP 140/54 | HR 85 | Temp 98.0°F | Resp 16 | Ht 62.0 in | Wt 165.0 lb

## 2014-07-17 DIAGNOSIS — L988 Other specified disorders of the skin and subcutaneous tissue: Secondary | ICD-10-CM | POA: Diagnosis not present

## 2014-07-17 DIAGNOSIS — D45 Polycythemia vera: Secondary | ICD-10-CM

## 2014-07-17 DIAGNOSIS — Z8673 Personal history of transient ischemic attack (TIA), and cerebral infarction without residual deficits: Secondary | ICD-10-CM | POA: Diagnosis not present

## 2014-07-17 DIAGNOSIS — M797 Fibromyalgia: Secondary | ICD-10-CM

## 2014-07-17 DIAGNOSIS — R109 Unspecified abdominal pain: Secondary | ICD-10-CM | POA: Diagnosis not present

## 2014-07-17 LAB — CBC WITH DIFFERENTIAL (CANCER CENTER ONLY)
BASO#: 0 10*3/uL (ref 0.0–0.2)
BASO%: 0.5 % (ref 0.0–2.0)
EOS%: 9.5 % — AB (ref 0.0–7.0)
Eosinophils Absolute: 0.4 10*3/uL (ref 0.0–0.5)
HCT: 29.2 % — ABNORMAL LOW (ref 34.8–46.6)
HGB: 7.2 g/dL — ABNORMAL LOW (ref 11.6–15.9)
LYMPH#: 1 10*3/uL (ref 0.9–3.3)
LYMPH%: 21.8 % (ref 14.0–48.0)
MCH: 17.1 pg — ABNORMAL LOW (ref 26.0–34.0)
MCHC: 24.7 g/dL — AB (ref 32.0–36.0)
MCV: 69 fL — ABNORMAL LOW (ref 81–101)
MONO#: 0.6 10*3/uL (ref 0.1–0.9)
MONO%: 13.2 % — ABNORMAL HIGH (ref 0.0–13.0)
NEUT%: 55 % (ref 39.6–80.0)
NEUTROS ABS: 2.4 10*3/uL (ref 1.5–6.5)
PLATELETS: 192 10*3/uL (ref 145–400)
RBC: 4.21 10*6/uL (ref 3.70–5.32)
RDW: 24.4 % — AB (ref 11.1–15.7)
WBC: 4.4 10*3/uL (ref 3.9–10.0)

## 2014-07-17 MED ORDER — OXYCODONE HCL 15 MG PO TABS: ORAL_TABLET | ORAL | Status: DC

## 2014-07-17 MED ORDER — OXYCODONE HCL ER 20 MG PO T12A: EXTENDED_RELEASE_TABLET | ORAL | Status: DC

## 2014-07-17 MED ORDER — OXYCODONE HCL ER 10 MG PO T12A: EXTENDED_RELEASE_TABLET | ORAL | Status: DC

## 2014-07-17 NOTE — Telephone Encounter (Signed)
Critical Value HGB 7.2 Dr Marin Olp notifed. No orders at this time.

## 2014-07-17 NOTE — Progress Notes (Signed)
Hematology and Oncology Follow Up Visit  [name] [id number] 29-Feb-1946 69 y.o. 07/17/2014   Principle Diagnosis:  Polycythemia vera -- hyperviscosity variant. 2. 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  Current Therapy:    Phlebotomy to maintain hemoglobin below 6.0     Interim History:  [name] is back for followup. Is doing okay. Her fibromyalgia has not been acting up. The warmer weather is helping her.   She gets her blood checked every couple weeks. She was phlebotomized about 6 weeks ago.  She's had no problems with cough. There is some abdominal pain. I suspect that this probably is from which she had asked surgery. She had a necrotic gallbladder. I suspect that she may have some adhesions. She has not had any nausea or vomiting. She is still eating fairly well.  She's having issues with hot flashes. Her gynecologist does not want put her on any kind of estrogen because of her past history of a CVA. I will defer any menopausal symptom management to her gynecologist.  Her skin has been doing pretty well. She has not had a problem with rashes.. However, she does have this lesion on her anterior abdominal wall. She has an area of vitiligo within the lesion. She will see her dermatologist for this.   She's had no issues with bowels or bladder. She is profoundly iron deficient from her phlebotomies. Medications:Allergies:  Allergies  Allergen Reactions  . Ethrane [Enflurane] Nausea And Vomiting  . Codeine Nausea Only  . Synthroid [Levothyroxine Sodium] Other (See Comments)    Not effective. Causes excessive sleepiness. Takes armour thyroid  . Calcium Channel Blockers Palpitations  . Penicillins Rash    Past Medical History, Surgical history, Social history, and Family History were reviewed and updated.  Review of Systems: As above  Physical Exam:  height is 5\' 2"  (1.575 m) and weight is 165 lb (74.844 kg). Her oral  temperature is 98 F (36.7 C). Her blood pressure is 140/54 and her pulse is 85. Her respiration is 16.   Well-developed and well-nourished white female in no obvious distress. Head and neck exam shows pale conjunctiva. There is no oral lesions. There is no adenopathy in the neck. Lungs are clear bilaterally. Cardiac exam regular rate and rhythm with a 1/6 systolic ejection murmur. Abdomen is soft. Has good bowel sounds. There is no palpable liver or spleen tip appreciative well-healed laparotomy scars. Back exam no tenderness over the spine ribs or hips. Extremities shows no clubbing cyanosis or edema. Neurological exam shows no focal neurological deficits. Skin exam no rashes, ecchymoses or petechia.  Lab Results  Component Value Date   WBC 4.4 07/17/2014   HGB 7.2* 07/17/2014   HCT 29.2* 07/17/2014   MCV 69* 07/17/2014   PLT 192 07/17/2014     Chemistry      Component Value Date/Time   NA 143 06/05/2014 1312   NA 138 01/01/2014 1106   K 3.3 06/05/2014 1312   K 3.7 01/01/2014 1106   CL 102 06/05/2014 1312   CL 100 01/01/2014 1106   CO2 32 06/05/2014 1312   CO2 31 01/01/2014 1106   BUN 6* 06/05/2014 1312   BUN 9 01/01/2014 1106   CREATININE 0.8 06/05/2014 1312   CREATININE 0.84 01/01/2014 1106      Component Value Date/Time   CALCIUM 8.9 06/05/2014 1312   CALCIUM 8.9 01/01/2014 1106   ALKPHOS 100* 06/05/2014 1312   ALKPHOS 74  01/01/2014 1106   AST 22 06/05/2014 1312   AST 13 01/01/2014 1106   ALT 9* 06/05/2014 1312   ALT <8 01/01/2014 1106   BILITOT 0.60 06/05/2014 1312   BILITOT 0.5 01/01/2014 1106         Impression and Plan: [name] is 69 year old female with polycythemia vera. She is hyperviscous. She does very well with her hemoglobin below 6. We will go ahead and phlebotomize her this week.  I'm just surprised that her blood is so high. I cannot over last time her hemoglobin was over 7.   I do want to check her blood next week to make sure that we have  phlebotomized her adequately. If not, then she will need another phlebotomy.  We will continue to check her blood every 2 weeks. .  She she will see her cardiologist in the near future. He typically does a very thorough job in examining her.  I will see her back in 2 more months.  Volanda Napoleon, MD 4/20/20163:57 PM

## 2014-07-18 ENCOUNTER — Ambulatory Visit (HOSPITAL_BASED_OUTPATIENT_CLINIC_OR_DEPARTMENT_OTHER): Payer: Medicare Other

## 2014-07-18 VITALS — BP 118/53 | HR 56 | Temp 98.0°F | Resp 18

## 2014-07-18 DIAGNOSIS — D45 Polycythemia vera: Secondary | ICD-10-CM

## 2014-07-18 NOTE — Patient Instructions (Signed)

## 2014-07-18 NOTE — Progress Notes (Signed)
[name] presents today for phlebotomy per MD orders. Phlebotomy procedure started at 1340 and ended at 1350. 500 grams removed. Patient observed for 30 minutes after procedure without any incident. Patient tolerated procedure well. IV needle removed intact.

## 2014-07-24 ENCOUNTER — Ambulatory Visit: Payer: BLUE CROSS/BLUE SHIELD | Admitting: Hematology & Oncology

## 2014-07-24 ENCOUNTER — Ambulatory Visit (HOSPITAL_BASED_OUTPATIENT_CLINIC_OR_DEPARTMENT_OTHER): Payer: Medicare Other

## 2014-07-24 ENCOUNTER — Other Ambulatory Visit (HOSPITAL_BASED_OUTPATIENT_CLINIC_OR_DEPARTMENT_OTHER): Payer: Medicare Other

## 2014-07-24 ENCOUNTER — Other Ambulatory Visit: Payer: BLUE CROSS/BLUE SHIELD | Admitting: Lab

## 2014-07-24 VITALS — BP 100/68 | HR 59 | Temp 97.8°F | Resp 18

## 2014-07-24 DIAGNOSIS — I358 Other nonrheumatic aortic valve disorders: Secondary | ICD-10-CM

## 2014-07-24 DIAGNOSIS — D45 Polycythemia vera: Secondary | ICD-10-CM | POA: Diagnosis present

## 2014-07-24 DIAGNOSIS — E039 Hypothyroidism, unspecified: Secondary | ICD-10-CM

## 2014-07-24 LAB — CBC WITH DIFFERENTIAL (CANCER CENTER ONLY)
BASO#: 0 10*3/uL (ref 0.0–0.2)
BASO%: 0.4 % (ref 0.0–2.0)
EOS%: 11.9 % — AB (ref 0.0–7.0)
Eosinophils Absolute: 0.6 10*3/uL — ABNORMAL HIGH (ref 0.0–0.5)
HCT: 25.3 % — ABNORMAL LOW (ref 34.8–46.6)
HGB: 6.2 g/dL — CL (ref 11.6–15.9)
LYMPH#: 1.1 10*3/uL (ref 0.9–3.3)
LYMPH%: 22.8 % (ref 14.0–48.0)
MCH: 17.1 pg — ABNORMAL LOW (ref 26.0–34.0)
MCHC: 24.5 g/dL — AB (ref 32.0–36.0)
MCV: 70 fL — ABNORMAL LOW (ref 81–101)
MONO#: 0.6 10*3/uL (ref 0.1–0.9)
MONO%: 12.1 % (ref 0.0–13.0)
NEUT#: 2.5 10*3/uL (ref 1.5–6.5)
NEUT%: 52.8 % (ref 39.6–80.0)
Platelets: 160 10*3/uL (ref 145–400)
RBC: 3.63 10*6/uL — ABNORMAL LOW (ref 3.70–5.32)
RDW: 22.5 % — AB (ref 11.1–15.7)
WBC: 4.8 10*3/uL (ref 3.9–10.0)

## 2014-07-24 NOTE — Progress Notes (Signed)
[name] presents today for phlebotomy per MD orders. Phlebotomy procedure started at 1435 and ended at 1445. 500 grams removed. Patient observed for 30 minutes after procedure without any incident. Patient tolerated procedure well. IV needle removed intact.

## 2014-07-24 NOTE — Patient Instructions (Signed)

## 2014-07-30 ENCOUNTER — Other Ambulatory Visit: Payer: Self-pay | Admitting: Cardiology

## 2014-07-31 ENCOUNTER — Other Ambulatory Visit (HOSPITAL_BASED_OUTPATIENT_CLINIC_OR_DEPARTMENT_OTHER): Payer: Medicare Other

## 2014-07-31 ENCOUNTER — Other Ambulatory Visit: Payer: BLUE CROSS/BLUE SHIELD | Admitting: Lab

## 2014-07-31 ENCOUNTER — Telehealth: Payer: Self-pay | Admitting: *Deleted

## 2014-07-31 DIAGNOSIS — D45 Polycythemia vera: Secondary | ICD-10-CM | POA: Diagnosis present

## 2014-07-31 DIAGNOSIS — E039 Hypothyroidism, unspecified: Secondary | ICD-10-CM

## 2014-07-31 DIAGNOSIS — I358 Other nonrheumatic aortic valve disorders: Secondary | ICD-10-CM

## 2014-07-31 LAB — CBC WITH DIFFERENTIAL (CANCER CENTER ONLY)
BASO#: 0 10*3/uL (ref 0.0–0.2)
BASO%: 0.6 % (ref 0.0–2.0)
EOS ABS: 0.6 10*3/uL — AB (ref 0.0–0.5)
EOS%: 11.8 % — AB (ref 0.0–7.0)
HCT: 25.5 % — ABNORMAL LOW (ref 34.8–46.6)
HGB: 6.4 g/dL — CL (ref 11.6–15.9)
LYMPH#: 0.9 10*3/uL (ref 0.9–3.3)
LYMPH%: 16.4 % (ref 14.0–48.0)
MCH: 17.4 pg — AB (ref 26.0–34.0)
MCHC: 25.1 g/dL — ABNORMAL LOW (ref 32.0–36.0)
MCV: 70 fL — ABNORMAL LOW (ref 81–101)
MONO#: 0.7 10*3/uL (ref 0.1–0.9)
MONO%: 12.8 % (ref 0.0–13.0)
NEUT%: 58.4 % (ref 39.6–80.0)
NEUTROS ABS: 3.1 10*3/uL (ref 1.5–6.5)
PLATELETS: 192 10*3/uL (ref 145–400)
RBC: 3.67 10*6/uL — AB (ref 3.70–5.32)
RDW: 22.2 % — ABNORMAL HIGH (ref 11.1–15.7)
WBC: 5.2 10*3/uL (ref 3.9–10.0)

## 2014-07-31 NOTE — Telephone Encounter (Signed)
Critical Value HGB 6.4 Dr Marin Olp notified. Appointment scheduled

## 2014-08-02 ENCOUNTER — Ambulatory Visit (HOSPITAL_BASED_OUTPATIENT_CLINIC_OR_DEPARTMENT_OTHER): Payer: Medicare Other

## 2014-08-02 VITALS — BP 103/51 | HR 57 | Temp 97.6°F | Resp 18

## 2014-08-02 DIAGNOSIS — D45 Polycythemia vera: Secondary | ICD-10-CM

## 2014-08-02 MED ORDER — LIDOCAINE-PRILOCAINE 2.5-2.5 % EX CREA: 1.0000 "application " | TOPICAL_CREAM | CUTANEOUS | Status: DC | PRN

## 2014-08-02 NOTE — Progress Notes (Signed)
[name] presents today for phlebotomy per MD orders. Phlebotomy procedure started at 1145 and ended at 1151 500 grams removed. Patient observed for 30 minutes after procedure without any incident. Patient tolerated procedure well. IV needle removed intact.

## 2014-08-02 NOTE — Patient Instructions (Signed)

## 2014-08-07 ENCOUNTER — Telehealth: Payer: Self-pay | Admitting: *Deleted

## 2014-08-07 ENCOUNTER — Other Ambulatory Visit: Payer: BLUE CROSS/BLUE SHIELD

## 2014-08-07 ENCOUNTER — Other Ambulatory Visit (HOSPITAL_BASED_OUTPATIENT_CLINIC_OR_DEPARTMENT_OTHER): Payer: Medicare Other

## 2014-08-07 DIAGNOSIS — E039 Hypothyroidism, unspecified: Secondary | ICD-10-CM

## 2014-08-07 DIAGNOSIS — D45 Polycythemia vera: Secondary | ICD-10-CM | POA: Diagnosis present

## 2014-08-07 DIAGNOSIS — I358 Other nonrheumatic aortic valve disorders: Secondary | ICD-10-CM

## 2014-08-07 LAB — CBC WITH DIFFERENTIAL (CANCER CENTER ONLY)
BASO#: 0 10*3/uL (ref 0.0–0.2)
BASO%: 0.5 % (ref 0.0–2.0)
EOS%: 9 % — AB (ref 0.0–7.0)
Eosinophils Absolute: 0.3 10*3/uL (ref 0.0–0.5)
HEMATOCRIT: 22.1 % — AB (ref 34.8–46.6)
HEMOGLOBIN: 5.5 g/dL — AB (ref 11.6–15.9)
LYMPH#: 0.7 10*3/uL — ABNORMAL LOW (ref 0.9–3.3)
LYMPH%: 17.5 % (ref 14.0–48.0)
MCH: 17.2 pg — ABNORMAL LOW (ref 26.0–34.0)
MCHC: 24.9 g/dL — AB (ref 32.0–36.0)
MCV: 69 fL — ABNORMAL LOW (ref 81–101)
MONO#: 0.4 10*3/uL (ref 0.1–0.9)
MONO%: 11.7 % (ref 0.0–13.0)
NEUT%: 61.3 % (ref 39.6–80.0)
NEUTROS ABS: 2.3 10*3/uL (ref 1.5–6.5)
Platelets: 200 10*3/uL (ref 145–400)
RBC: 3.2 10*6/uL — ABNORMAL LOW (ref 3.70–5.32)
RDW: 21.4 % — AB (ref 11.1–15.7)
WBC: 3.8 10*3/uL — ABNORMAL LOW (ref 3.9–10.0)

## 2014-08-07 NOTE — Telephone Encounter (Signed)
Critical Value HGB 5.5 Dr Marin Olp notified. No order received.

## 2014-08-12 ENCOUNTER — Other Ambulatory Visit: Payer: Self-pay | Admitting: Cardiology

## 2014-08-12 ENCOUNTER — Other Ambulatory Visit: Payer: Self-pay | Admitting: Neurology

## 2014-08-12 ENCOUNTER — Other Ambulatory Visit: Payer: Self-pay | Admitting: Hematology & Oncology

## 2014-08-13 ENCOUNTER — Telehealth: Payer: Self-pay | Admitting: Cardiology

## 2014-08-13 DIAGNOSIS — I119 Hypertensive heart disease without heart failure: Secondary | ICD-10-CM

## 2014-08-13 DIAGNOSIS — E039 Hypothyroidism, unspecified: Secondary | ICD-10-CM

## 2014-08-13 MED ORDER — CEPHALEXIN 500 MG PO CAPS
500.0000 mg | ORAL_CAPSULE | Freq: Two times a day (BID) | ORAL | Status: DC
Start: 2014-08-13 — End: 2014-11-06

## 2014-08-13 NOTE — Telephone Encounter (Signed)
Rx sent to pharmacy   

## 2014-08-13 NOTE — Telephone Encounter (Signed)
New Message    Patient is calling bc she needs to know what labs she needs to have drawn for Dr Mare Ferrari bc her other Dr, Dr Marin Olp is drawing labs also and she don't want them to draw labs that Dr. Mare Ferrari may need. Please give patient a call. Patient that she will be home all day.

## 2014-08-13 NOTE — Telephone Encounter (Signed)
Has appt in July

## 2014-08-13 NOTE — Telephone Encounter (Signed)
OK to refill Cipro

## 2014-08-13 NOTE — Telephone Encounter (Signed)
Patient would like Rx for Cipro sent to [facility] with refills  Will forward to  Dr. Mare Ferrari for review

## 2014-08-14 ENCOUNTER — Other Ambulatory Visit: Payer: Self-pay | Admitting: Cardiology

## 2014-08-14 ENCOUNTER — Other Ambulatory Visit (HOSPITAL_BASED_OUTPATIENT_CLINIC_OR_DEPARTMENT_OTHER): Payer: Medicare Other

## 2014-08-14 DIAGNOSIS — E039 Hypothyroidism, unspecified: Secondary | ICD-10-CM

## 2014-08-14 DIAGNOSIS — D45 Polycythemia vera: Secondary | ICD-10-CM | POA: Diagnosis present

## 2014-08-14 DIAGNOSIS — I358 Other nonrheumatic aortic valve disorders: Secondary | ICD-10-CM

## 2014-08-14 LAB — CBC WITH DIFFERENTIAL (CANCER CENTER ONLY)
BASO#: 0 10*3/uL (ref 0.0–0.2)
BASO%: 0.2 % (ref 0.0–2.0)
EOS%: 5.5 % (ref 0.0–7.0)
Eosinophils Absolute: 0.3 10*3/uL (ref 0.0–0.5)
HEMATOCRIT: 22.7 % — AB (ref 34.8–46.6)
HGB: 5.6 g/dL — CL (ref 11.6–15.9)
LYMPH#: 1 10*3/uL (ref 0.9–3.3)
LYMPH%: 21.6 % (ref 14.0–48.0)
MCH: 17.1 pg — ABNORMAL LOW (ref 26.0–34.0)
MCHC: 24.7 g/dL — AB (ref 32.0–36.0)
MCV: 69 fL — ABNORMAL LOW (ref 81–101)
MONO#: 0.7 10*3/uL (ref 0.1–0.9)
MONO%: 14.8 % — ABNORMAL HIGH (ref 0.0–13.0)
NEUT#: 2.6 10*3/uL (ref 1.5–6.5)
NEUT%: 57.9 % (ref 39.6–80.0)
Platelets: 148 10*3/uL (ref 145–400)
RBC: 3.28 10*6/uL — ABNORMAL LOW (ref 3.70–5.32)
RDW: 20.9 % — AB (ref 11.1–15.7)
WBC: 4.5 10*3/uL (ref 3.9–10.0)

## 2014-08-21 ENCOUNTER — Other Ambulatory Visit: Payer: BLUE CROSS/BLUE SHIELD

## 2014-08-23 ENCOUNTER — Other Ambulatory Visit: Payer: Self-pay | Admitting: Nurse Practitioner

## 2014-08-23 DIAGNOSIS — D45 Polycythemia vera: Secondary | ICD-10-CM

## 2014-08-23 DIAGNOSIS — M797 Fibromyalgia: Secondary | ICD-10-CM

## 2014-08-23 MED ORDER — OXYCODONE HCL ER 10 MG PO T12A: EXTENDED_RELEASE_TABLET | ORAL | Status: DC

## 2014-08-23 MED ORDER — OXYCODONE HCL 15 MG PO TABS: ORAL_TABLET | ORAL | Status: DC

## 2014-08-23 MED ORDER — OXYCODONE HCL ER 20 MG PO T12A: EXTENDED_RELEASE_TABLET | ORAL | Status: DC

## 2014-08-28 ENCOUNTER — Other Ambulatory Visit (HOSPITAL_BASED_OUTPATIENT_CLINIC_OR_DEPARTMENT_OTHER): Payer: Medicare Other

## 2014-08-28 ENCOUNTER — Telehealth: Payer: Self-pay | Admitting: *Deleted

## 2014-08-28 DIAGNOSIS — I350 Nonrheumatic aortic (valve) stenosis: Secondary | ICD-10-CM | POA: Diagnosis not present

## 2014-08-28 DIAGNOSIS — D45 Polycythemia vera: Secondary | ICD-10-CM

## 2014-08-28 DIAGNOSIS — M797 Fibromyalgia: Secondary | ICD-10-CM

## 2014-08-28 LAB — CBC WITH DIFFERENTIAL (CANCER CENTER ONLY)
BASO#: 0 10*3/uL (ref 0.0–0.2)
BASO%: 0.2 % (ref 0.0–2.0)
EOS ABS: 0.3 10*3/uL (ref 0.0–0.5)
EOS%: 6.7 % (ref 0.0–7.0)
HCT: 23.8 % — ABNORMAL LOW (ref 34.8–46.6)
HEMOGLOBIN: 5.8 g/dL — AB (ref 11.6–15.9)
LYMPH#: 0.8 10*3/uL — ABNORMAL LOW (ref 0.9–3.3)
LYMPH%: 19.1 % (ref 14.0–48.0)
MCH: 16.6 pg — ABNORMAL LOW (ref 26.0–34.0)
MCHC: 24.4 g/dL — ABNORMAL LOW (ref 32.0–36.0)
MCV: 68 fL — AB (ref 81–101)
MONO#: 0.6 10*3/uL (ref 0.1–0.9)
MONO%: 12.9 % (ref 0.0–13.0)
NEUT#: 2.7 10*3/uL (ref 1.5–6.5)
NEUT%: 61.1 % (ref 39.6–80.0)
Platelets: 170 10*3/uL (ref 145–400)
RBC: 3.5 10*6/uL — ABNORMAL LOW (ref 3.70–5.32)
RDW: 20.8 % — AB (ref 11.1–15.7)
WBC: 4.3 10*3/uL (ref 3.9–10.0)

## 2014-08-28 LAB — COMPREHENSIVE METABOLIC PANEL
ALT: 9 U/L (ref 0–35)
AST: 18 U/L (ref 0–37)
Albumin: 4 g/dL (ref 3.5–5.2)
Alkaline Phosphatase: 82 U/L (ref 39–117)
BUN: 7 mg/dL (ref 6–23)
CO2: 29 mEq/L (ref 19–32)
CREATININE: 0.86 mg/dL (ref 0.50–1.10)
Calcium: 8.9 mg/dL (ref 8.4–10.5)
Chloride: 101 mEq/L (ref 96–112)
Glucose, Bld: 135 mg/dL — ABNORMAL HIGH (ref 70–99)
Potassium: 3.4 mEq/L — ABNORMAL LOW (ref 3.5–5.3)
Sodium: 139 mEq/L (ref 135–145)
Total Bilirubin: 0.4 mg/dL (ref 0.2–1.2)
Total Protein: 6.8 g/dL (ref 6.0–8.3)

## 2014-08-28 NOTE — Telephone Encounter (Signed)
Critical Value HGB 5.8 Dr Marin Olp notified. No orders received.

## 2014-09-04 ENCOUNTER — Other Ambulatory Visit: Payer: BLUE CROSS/BLUE SHIELD

## 2014-09-04 ENCOUNTER — Ambulatory Visit: Payer: BLUE CROSS/BLUE SHIELD | Admitting: Hematology & Oncology

## 2014-09-10 ENCOUNTER — Other Ambulatory Visit: Payer: Self-pay | Admitting: *Deleted

## 2014-09-10 DIAGNOSIS — D45 Polycythemia vera: Secondary | ICD-10-CM

## 2014-09-11 ENCOUNTER — Other Ambulatory Visit (HOSPITAL_BASED_OUTPATIENT_CLINIC_OR_DEPARTMENT_OTHER): Payer: Medicare Other

## 2014-09-11 ENCOUNTER — Ambulatory Visit (HOSPITAL_BASED_OUTPATIENT_CLINIC_OR_DEPARTMENT_OTHER): Payer: Medicare Other

## 2014-09-11 ENCOUNTER — Encounter: Payer: Self-pay | Admitting: Hematology & Oncology

## 2014-09-11 ENCOUNTER — Ambulatory Visit (HOSPITAL_BASED_OUTPATIENT_CLINIC_OR_DEPARTMENT_OTHER): Payer: Medicare Other | Admitting: Hematology & Oncology

## 2014-09-11 VITALS — BP 111/65 | HR 68 | Temp 98.0°F | Resp 18

## 2014-09-11 VITALS — BP 113/60 | HR 64 | Temp 97.7°F | Resp 14 | Ht 62.0 in | Wt 161.0 lb

## 2014-09-11 DIAGNOSIS — D45 Polycythemia vera: Secondary | ICD-10-CM

## 2014-09-11 DIAGNOSIS — Z8673 Personal history of transient ischemic attack (TIA), and cerebral infarction without residual deficits: Secondary | ICD-10-CM

## 2014-09-11 LAB — CBC WITH DIFFERENTIAL (CANCER CENTER ONLY)
BASO#: 0 10*3/uL (ref 0.0–0.2)
BASO%: 0.9 % (ref 0.0–2.0)
EOS ABS: 0.4 10*3/uL (ref 0.0–0.5)
EOS%: 8.4 % — ABNORMAL HIGH (ref 0.0–7.0)
HCT: 25.9 % — ABNORMAL LOW (ref 34.8–46.6)
HGB: 6.4 g/dL — CL (ref 11.6–15.9)
LYMPH#: 0.8 10*3/uL — ABNORMAL LOW (ref 0.9–3.3)
LYMPH%: 18.5 % (ref 14.0–48.0)
MCH: 16.8 pg — ABNORMAL LOW (ref 26.0–34.0)
MCHC: 24.7 g/dL — ABNORMAL LOW (ref 32.0–36.0)
MCV: 68 fL — AB (ref 81–101)
MONO#: 0.6 10*3/uL (ref 0.1–0.9)
MONO%: 13.6 % — AB (ref 0.0–13.0)
NEUT#: 2.7 10*3/uL (ref 1.5–6.5)
NEUT%: 58.6 % (ref 39.6–80.0)
PLATELETS: 154 10*3/uL (ref 145–400)
RBC: 3.82 10*6/uL (ref 3.70–5.32)
RDW: 20.9 % — ABNORMAL HIGH (ref 11.1–15.7)
WBC: 4.6 10*3/uL (ref 3.9–10.0)

## 2014-09-11 LAB — COMPREHENSIVE METABOLIC PANEL
ALBUMIN: 3.9 g/dL (ref 3.5–5.2)
ALK PHOS: 98 U/L (ref 39–117)
ALT: <8 U/L (ref 0–35)
AST: 13 U/L (ref 0–37)
BUN: 7 mg/dL (ref 6–23)
CHLORIDE: 101 meq/L (ref 96–112)
CO2: 32 mEq/L (ref 19–32)
Calcium: 9 mg/dL (ref 8.4–10.5)
Creatinine, Ser: 0.8 mg/dL (ref 0.50–1.10)
Glucose, Bld: 103 mg/dL — ABNORMAL HIGH (ref 70–99)
POTASSIUM: 3.8 meq/L (ref 3.5–5.3)
Sodium: 141 mEq/L (ref 135–145)
Total Bilirubin: 0.6 mg/dL (ref 0.2–1.2)
Total Protein: 6.8 g/dL (ref 6.0–8.3)

## 2014-09-11 NOTE — Progress Notes (Signed)
[name] presents today for phlebotomy per MD orders. Phlebotomy procedure started at 1400 and ended at 1411. 500 grams removed. Patient observed for 30 minutes after procedure without any incident. Patient tolerated procedure well. IV needle removed intact.

## 2014-09-11 NOTE — Progress Notes (Signed)
Hematology and Oncology Follow Up Visit  [name] [id number] Aug 01, 1945 69 y.o. 09/11/2014   Principle Diagnosis:  Polycythemia vera -- hyperviscosity variant. 2. 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  Current Therapy:    Phlebotomy to maintain hemoglobin below 6.0     Interim History:  [name] is back for followup. Is doing okay. Her fibromyalgia has not been acting up. The warmer weather is helping her.   She gets her blood checked every couple weeks.   She does feel sluggish. She knows that her blood is too high today area and her hemoglobin is 6.4. We will go ahead and phlebotomize her.  She is had no cough. She's had no change in bowel or bladder habits. She's had no rashes. She's had no leg swelling. She's had no headache.  There's been no abdominal pain. She's had abdominal surgery secondary to a necrotic gallbladder. This was, I think, 2 years ago.  She has had a CVA. This has been alleviated with carotid artery surgery. She is on Plavix and aspirin.    Overall, her performance status is ECOG 1. Medications:Allergies:  Allergies  Allergen Reactions  . Ethrane [Enflurane] Nausea And Vomiting  . Codeine Nausea Only  . Synthroid [Levothyroxine Sodium] Other (See Comments)    Not effective. Causes excessive sleepiness. Takes armour thyroid  . Calcium Channel Blockers Palpitations  . Penicillins Rash    Past Medical History, Surgical history, Social history, and Family History were reviewed and updated.  Review of Systems: As above  Physical Exam:  height is 5\' 2"  (1.575 m) and weight is 161 lb (73.029 kg). Her oral temperature is 97.7 F (36.5 C). Her blood pressure is 113/60 and her pulse is 64. Her respiration is 14.   Well-developed and well-nourished white female in no obvious distress. Head and neck exam shows pale conjunctiva. There is no oral lesions. There is no adenopathy in the neck. Lungs are clear  bilaterally. Cardiac exam regular rate and rhythm with a 1/6 systolic ejection murmur. Abdomen is soft. Has good bowel sounds. There is no palpable liver or spleen tip appreciative well-healed laparotomy scars. Back exam no tenderness over the spine ribs or hips. Extremities shows no clubbing cyanosis or edema. Neurological exam shows no focal neurological deficits. Skin exam no rashes, ecchymoses or petechia.  Lab Results  Component Value Date   WBC 4.6 09/11/2014   HGB 6.4* 09/11/2014   HCT 25.9* 09/11/2014   MCV 68* 09/11/2014   PLT 154 09/11/2014     Chemistry      Component Value Date/Time   NA 139 08/28/2014 1311   NA 143 06/05/2014 1312   K 3.4* 08/28/2014 1311   K 3.3 06/05/2014 1312   CL 101 08/28/2014 1311   CL 102 06/05/2014 1312   CO2 29 08/28/2014 1311   CO2 32 06/05/2014 1312   BUN 7 08/28/2014 1311   BUN 6* 06/05/2014 1312   CREATININE 0.86 08/28/2014 1311   CREATININE 0.8 06/05/2014 1312      Component Value Date/Time   CALCIUM 8.9 08/28/2014 1311   CALCIUM 8.9 06/05/2014 1312   ALKPHOS 82 08/28/2014 1311   ALKPHOS 100* 06/05/2014 1312   AST 18 08/28/2014 1311   AST 22 06/05/2014 1312   ALT 9 08/28/2014 1311   ALT 9* 06/05/2014 1312   BILITOT 0.4 08/28/2014 1311   BILITOT 0.60 06/05/2014 1312         Impression and  Plan: [name] is 69 year old female with polycythemia vera. She is hyperviscous. She does very well with her hemoglobin below 6. We will go ahead and phlebotomize her today..  Overall, I don't see any new issues with her.  I talked her pharmacist about any interaction with her Plavix and the Paxil that she is taking for hot flashes. She cannot find any adverse interaction.  We will continue have her come in every 2 weeks for lab work.  I will see her back in 2 more months.  Volanda Napoleon, MD 6/15/201612:55 PM

## 2014-09-11 NOTE — Patient Instructions (Signed)

## 2014-09-23 ENCOUNTER — Other Ambulatory Visit: Payer: Self-pay

## 2014-09-24 ENCOUNTER — Other Ambulatory Visit: Payer: Self-pay | Admitting: *Deleted

## 2014-09-24 DIAGNOSIS — M797 Fibromyalgia: Secondary | ICD-10-CM

## 2014-09-24 DIAGNOSIS — D45 Polycythemia vera: Secondary | ICD-10-CM

## 2014-09-24 MED ORDER — OXYCODONE HCL ER 20 MG PO T12A: EXTENDED_RELEASE_TABLET | ORAL | Status: DC

## 2014-09-24 MED ORDER — OXYCODONE HCL ER 10 MG PO T12A: EXTENDED_RELEASE_TABLET | ORAL | Status: DC

## 2014-09-24 MED ORDER — OXYCODONE HCL 15 MG PO TABS: ORAL_TABLET | ORAL | Status: DC

## 2014-09-25 ENCOUNTER — Telehealth: Payer: Self-pay | Admitting: *Deleted

## 2014-09-25 ENCOUNTER — Other Ambulatory Visit (HOSPITAL_BASED_OUTPATIENT_CLINIC_OR_DEPARTMENT_OTHER): Payer: Medicare Other

## 2014-09-25 DIAGNOSIS — D45 Polycythemia vera: Secondary | ICD-10-CM

## 2014-09-25 LAB — CBC WITH DIFFERENTIAL (CANCER CENTER ONLY)
BASO#: 0 10*3/uL (ref 0.0–0.2)
BASO%: 0.2 % (ref 0.0–2.0)
EOS ABS: 0.6 10*3/uL — AB (ref 0.0–0.5)
EOS%: 13 % — ABNORMAL HIGH (ref 0.0–7.0)
HEMATOCRIT: 25.1 % — AB (ref 34.8–46.6)
HGB: 6.1 g/dL — CL (ref 11.6–15.9)
LYMPH#: 0.8 10*3/uL — ABNORMAL LOW (ref 0.9–3.3)
LYMPH%: 17.2 % (ref 14.0–48.0)
MCH: 16.6 pg — ABNORMAL LOW (ref 26.0–34.0)
MCHC: 24.3 g/dL — ABNORMAL LOW (ref 32.0–36.0)
MCV: 68 fL — AB (ref 81–101)
MONO#: 0.6 10*3/uL (ref 0.1–0.9)
MONO%: 13.2 % — ABNORMAL HIGH (ref 0.0–13.0)
NEUT#: 2.7 10*3/uL (ref 1.5–6.5)
NEUT%: 56.4 % (ref 39.6–80.0)
Platelets: 165 10*3/uL (ref 145–400)
RBC: 3.67 10*6/uL — ABNORMAL LOW (ref 3.70–5.32)
RDW: 22 % — AB (ref 11.1–15.7)
WBC: 4.8 10*3/uL (ref 3.9–10.0)

## 2014-09-25 NOTE — Telephone Encounter (Signed)
Critical Value HGB 6.1 Dr Marin Olp notified and patient scheduled for phlebotomy.

## 2014-09-27 ENCOUNTER — Ambulatory Visit (HOSPITAL_BASED_OUTPATIENT_CLINIC_OR_DEPARTMENT_OTHER): Payer: Medicare Other

## 2014-09-27 VITALS — BP 108/69 | HR 59 | Temp 98.5°F | Resp 18 | Ht 62.0 in | Wt 162.0 lb

## 2014-09-27 DIAGNOSIS — D45 Polycythemia vera: Secondary | ICD-10-CM

## 2014-09-27 NOTE — Progress Notes (Signed)
[name] presents today for phlebotomy per MD orders. Phlebotomy procedure started at 1215 and ended at 1230 grams removed. Patient observed for 30 minutes after procedure without any incident. Patient tolerated procedure well. IV needle removed intact.

## 2014-09-27 NOTE — Patient Instructions (Signed)

## 2014-10-09 ENCOUNTER — Telehealth: Payer: Self-pay | Admitting: Hematology & Oncology

## 2014-10-09 ENCOUNTER — Telehealth: Payer: Self-pay | Admitting: *Deleted

## 2014-10-09 ENCOUNTER — Other Ambulatory Visit (HOSPITAL_BASED_OUTPATIENT_CLINIC_OR_DEPARTMENT_OTHER): Payer: Medicare Other

## 2014-10-09 DIAGNOSIS — D45 Polycythemia vera: Secondary | ICD-10-CM

## 2014-10-09 LAB — COMPREHENSIVE METABOLIC PANEL (CC13)
AST: 16 U/L (ref 5–34)
Albumin: 3.9 g/dL (ref 3.5–5.0)
Alkaline Phosphatase: 109 U/L (ref 40–150)
Anion Gap: 8 mEq/L (ref 3–11)
BILIRUBIN TOTAL: 0.52 mg/dL (ref 0.20–1.20)
BUN: 10.7 mg/dL (ref 7.0–26.0)
CO2: 33 meq/L — AB (ref 22–29)
CREATININE: 0.9 mg/dL (ref 0.6–1.1)
Calcium: 9.4 mg/dL (ref 8.4–10.4)
Chloride: 100 mEq/L (ref 98–109)
EGFR: 66 mL/min/{1.73_m2} — AB (ref >90)
Glucose: 109 mg/dl (ref 70–140)
Potassium: 4.1 mEq/L (ref 3.5–5.1)
SODIUM: 141 meq/L (ref 136–145)
Total Protein: 7.1 g/dL (ref 6.4–8.3)

## 2014-10-09 LAB — CBC WITH DIFFERENTIAL (CANCER CENTER ONLY)
BASO#: 0 10*3/uL (ref 0.0–0.2)
BASO%: 0.2 % (ref 0.0–2.0)
EOS%: 6.3 % (ref 0.0–7.0)
Eosinophils Absolute: 0.4 10*3/uL (ref 0.0–0.5)
HEMATOCRIT: 23.1 % — AB (ref 34.8–46.6)
HEMOGLOBIN: 5.7 g/dL — AB (ref 11.6–15.9)
LYMPH#: 1 10*3/uL (ref 0.9–3.3)
LYMPH%: 15.3 % (ref 14.0–48.0)
MCH: 16.9 pg — ABNORMAL LOW (ref 26.0–34.0)
MCHC: 24.7 g/dL — AB (ref 32.0–36.0)
MCV: 68 fL — ABNORMAL LOW (ref 81–101)
MONO#: 0.7 10*3/uL (ref 0.1–0.9)
MONO%: 11 % (ref 0.0–13.0)
NEUT%: 67.2 % (ref 39.6–80.0)
NEUTROS ABS: 4.5 10*3/uL (ref 1.5–6.5)
Platelets: 209 10*3/uL (ref 145–400)
RBC: 3.38 10*6/uL — AB (ref 3.70–5.32)
RDW: 21.4 % — ABNORMAL HIGH (ref 11.1–15.7)
WBC: 6.6 10*3/uL (ref 3.9–10.0)

## 2014-10-09 NOTE — Telephone Encounter (Signed)
Tried to contact pt regarding scheduling a lab appt for wk of 7/18, continued to receive a recording regarding not being able to complete the phone call

## 2014-10-09 NOTE — Telephone Encounter (Signed)
Critical Value HGB 5.7 Dr Marin Olp notified and no orders received.  Reviewed results with patient. She is concerned because HGB is too close to 6.0. She wants to come in next week for labs to see where her HGB sits. Dr Marin Olp is okay with this. Message sent to scheduler.

## 2014-10-10 ENCOUNTER — Telehealth: Payer: Self-pay | Admitting: Hematology & Oncology

## 2014-10-10 NOTE — Telephone Encounter (Signed)
Talked with Pt regarding all future appt. Pt confirmed appt.

## 2014-10-15 ENCOUNTER — Ambulatory Visit: Payer: Medicare Other | Admitting: Neurology

## 2014-10-15 ENCOUNTER — Ambulatory Visit (HOSPITAL_BASED_OUTPATIENT_CLINIC_OR_DEPARTMENT_OTHER): Payer: Medicare Other

## 2014-10-15 VITALS — BP 132/58 | HR 83 | Temp 97.8°F | Resp 18

## 2014-10-15 DIAGNOSIS — D45 Polycythemia vera: Secondary | ICD-10-CM

## 2014-10-15 NOTE — Patient Instructions (Signed)

## 2014-10-15 NOTE — Progress Notes (Signed)
[name] presents today for phlebotomy per MD orders. Phlebotomy procedure started at 1230 and ended at 1245. 500 grams removed. Patient observed for 30 minutes after procedure without any incident. Patient tolerated procedure well. IV needle removed intact.

## 2014-10-16 ENCOUNTER — Other Ambulatory Visit: Payer: Medicare Other

## 2014-10-21 ENCOUNTER — Other Ambulatory Visit: Payer: Self-pay | Admitting: *Deleted

## 2014-10-21 DIAGNOSIS — D45 Polycythemia vera: Secondary | ICD-10-CM

## 2014-10-21 DIAGNOSIS — M797 Fibromyalgia: Secondary | ICD-10-CM

## 2014-10-21 MED ORDER — OXYCODONE HCL 15 MG PO TABS: ORAL_TABLET | ORAL | Status: DC

## 2014-10-21 MED ORDER — OXYCODONE HCL ER 20 MG PO T12A: EXTENDED_RELEASE_TABLET | ORAL | Status: DC

## 2014-10-21 MED ORDER — OXYCODONE HCL ER 10 MG PO T12A: EXTENDED_RELEASE_TABLET | ORAL | Status: DC

## 2014-10-23 ENCOUNTER — Telehealth: Payer: Self-pay | Admitting: *Deleted

## 2014-10-23 ENCOUNTER — Other Ambulatory Visit (HOSPITAL_BASED_OUTPATIENT_CLINIC_OR_DEPARTMENT_OTHER): Payer: Medicare Other

## 2014-10-23 DIAGNOSIS — D45 Polycythemia vera: Secondary | ICD-10-CM

## 2014-10-23 LAB — CBC WITH DIFFERENTIAL (CANCER CENTER ONLY)
BASO#: 0 10*3/uL (ref 0.0–0.2)
BASO%: 0.5 % (ref 0.0–2.0)
EOS%: 6.6 % (ref 0.0–7.0)
Eosinophils Absolute: 0.4 10*3/uL (ref 0.0–0.5)
HEMATOCRIT: 21.5 % — AB (ref 34.8–46.6)
HGB: 5.5 g/dL — CL (ref 11.6–15.9)
LYMPH#: 1.1 10*3/uL (ref 0.9–3.3)
LYMPH%: 19.8 % (ref 14.0–48.0)
MCH: 17.8 pg — ABNORMAL LOW (ref 26.0–34.0)
MCHC: 25.6 g/dL — ABNORMAL LOW (ref 32.0–36.0)
MCV: 70 fL — AB (ref 81–101)
MONO#: 0.8 10*3/uL (ref 0.1–0.9)
MONO%: 13.7 % — ABNORMAL HIGH (ref 0.0–13.0)
NEUT#: 3.3 10*3/uL (ref 1.5–6.5)
NEUT%: 59.4 % (ref 39.6–80.0)
Platelets: 195 10*3/uL (ref 145–400)
RBC: 3.09 10*6/uL — ABNORMAL LOW (ref 3.70–5.32)
RDW: 23.1 % — AB (ref 11.1–15.7)
WBC: 5.6 10*3/uL (ref 3.9–10.0)

## 2014-10-23 NOTE — Telephone Encounter (Signed)
Critical Value HGB 5.5 Dr Marin Olp notifed. No orders received

## 2014-10-28 ENCOUNTER — Other Ambulatory Visit: Payer: Self-pay | Admitting: Cardiology

## 2014-10-31 ENCOUNTER — Other Ambulatory Visit: Payer: Self-pay | Admitting: Cardiology

## 2014-10-31 ENCOUNTER — Other Ambulatory Visit: Payer: Self-pay | Admitting: Neurology

## 2014-10-31 NOTE — Telephone Encounter (Signed)
Has appt scheduled.

## 2014-11-05 ENCOUNTER — Other Ambulatory Visit: Payer: Self-pay

## 2014-11-05 MED ORDER — FUROSEMIDE 40 MG PO TABS: 40.0000 mg | ORAL_TABLET | Freq: Two times a day (BID) | ORAL | Status: DC

## 2014-11-06 ENCOUNTER — Telehealth: Payer: Self-pay | Admitting: *Deleted

## 2014-11-06 ENCOUNTER — Ambulatory Visit (HOSPITAL_BASED_OUTPATIENT_CLINIC_OR_DEPARTMENT_OTHER): Payer: Medicare Other | Admitting: Hematology & Oncology

## 2014-11-06 ENCOUNTER — Encounter: Payer: Self-pay | Admitting: Hematology & Oncology

## 2014-11-06 ENCOUNTER — Ambulatory Visit: Payer: Medicare Other

## 2014-11-06 ENCOUNTER — Other Ambulatory Visit (HOSPITAL_BASED_OUTPATIENT_CLINIC_OR_DEPARTMENT_OTHER): Payer: Medicare Other

## 2014-11-06 VITALS — BP 133/55 | HR 94 | Temp 98.1°F | Resp 20 | Ht 62.0 in | Wt 156.0 lb

## 2014-11-06 DIAGNOSIS — D45 Polycythemia vera: Secondary | ICD-10-CM

## 2014-11-06 DIAGNOSIS — F329 Major depressive disorder, single episode, unspecified: Secondary | ICD-10-CM

## 2014-11-06 DIAGNOSIS — E032 Hypothyroidism due to medicaments and other exogenous substances: Secondary | ICD-10-CM

## 2014-11-06 DIAGNOSIS — I350 Nonrheumatic aortic (valve) stenosis: Secondary | ICD-10-CM | POA: Diagnosis not present

## 2014-11-06 DIAGNOSIS — Z780 Asymptomatic menopausal state: Secondary | ICD-10-CM

## 2014-11-06 LAB — CBC WITH DIFFERENTIAL (CANCER CENTER ONLY)
BASO#: 0 10*3/uL (ref 0.0–0.2)
BASO%: 0.1 % (ref 0.0–2.0)
EOS ABS: 0.4 10*3/uL (ref 0.0–0.5)
EOS%: 5 % (ref 0.0–7.0)
HCT: 23.6 % — ABNORMAL LOW (ref 34.8–46.6)
HGB: 5.8 g/dL — CL (ref 11.6–15.9)
LYMPH#: 0.7 10*3/uL — AB (ref 0.9–3.3)
LYMPH%: 8.7 % — ABNORMAL LOW (ref 14.0–48.0)
MCH: 17.2 pg — ABNORMAL LOW (ref 26.0–34.0)
MCHC: 24.6 g/dL — ABNORMAL LOW (ref 32.0–36.0)
MCV: 70 fL — ABNORMAL LOW (ref 81–101)
MONO#: 0.9 10*3/uL (ref 0.1–0.9)
MONO%: 11.3 % (ref 0.0–13.0)
NEUT%: 74.9 % (ref 39.6–80.0)
NEUTROS ABS: 6.1 10*3/uL (ref 1.5–6.5)
Platelets: 239 10*3/uL (ref 145–400)
RBC: 3.37 10*6/uL — ABNORMAL LOW (ref 3.70–5.32)
RDW: 21.3 % — ABNORMAL HIGH (ref 11.1–15.7)
WBC: 8.1 10*3/uL (ref 3.9–10.0)

## 2014-11-06 LAB — COMPREHENSIVE METABOLIC PANEL
ALBUMIN: 3.9 g/dL (ref 3.6–5.1)
ALK PHOS: 94 U/L (ref 33–130)
ALT: 6 U/L (ref 6–29)
AST: 18 U/L (ref 10–35)
BILIRUBIN TOTAL: 0.6 mg/dL (ref 0.2–1.2)
BUN: 10 mg/dL (ref 7–25)
CO2: 29 mmol/L (ref 20–31)
Calcium: 9.1 mg/dL (ref 8.6–10.4)
Chloride: 97 mmol/L — ABNORMAL LOW (ref 98–110)
Creatinine, Ser: 0.83 mg/dL (ref 0.60–0.93)
GLUCOSE: 126 mg/dL — AB (ref 65–99)
Potassium: 3.9 mmol/L (ref 3.5–5.3)
SODIUM: 135 mmol/L (ref 135–146)
TOTAL PROTEIN: 6.8 g/dL (ref 6.1–8.1)

## 2014-11-06 NOTE — Progress Notes (Signed)
No phlebotomy today per dr. ennever 

## 2014-11-06 NOTE — Progress Notes (Signed)
Hematology and Oncology Follow Up Visit  [name] [id number] 1945/09/14 69 y.o. 11/06/2014   Principle Diagnosis:  Polycythemia vera -- hyperviscosity variant. 2. 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  Current Therapy:    Phlebotomy to maintain hemoglobin below 6.0     Interim History:  [name] is back for followup. Is doing okay. Her fibromyalgia has not been acting up. The warmer weather is helping her.   She gets her blood checked every couple weeks. She is a little worried that her blood use to be phlebotomized more often. I told her that this really is not that uncommon, but particularly during the summer months.  She does feel sluggish. She feels as if she has some element of depression. She going to try some St. John's wort to see if this helps. She continues on Plavix and aspirin. She does have the carotid artery issues. She does not want any carotid artery surgery.   Overall, her performance status is ECOG 1. Medications:Allergies:  Allergies  Allergen Reactions  . Ethrane [Enflurane] Nausea And Vomiting  . Codeine Nausea Only  . Synthroid [Levothyroxine Sodium] Other (See Comments)    Not effective. Causes excessive sleepiness. Takes armour thyroid  . Calcium Channel Blockers Palpitations  . Penicillins Rash    Past Medical History, Surgical history, Social history, and Family History were reviewed and updated.  Review of Systems: As above  Physical Exam:  height is 5\' 2"  (1.575 m) and weight is 156 lb (70.761 kg). Her oral temperature is 98.1 F (36.7 C). Her blood pressure is 133/55 and her pulse is 94. Her respiration is 20.   Well-developed and well-nourished white female in no obvious distress. Head and neck exam shows pale conjunctiva. There is no oral lesions. There is no adenopathy in the neck. Lungs are clear bilaterally. Cardiac exam regular rate and rhythm with a 1/6 systolic ejection murmur. Abdomen is  soft. Has good bowel sounds. There is no palpable liver or spleen tip appreciative well-healed laparotomy scars. Back exam no tenderness over the spine ribs or hips. Extremities shows no clubbing cyanosis or edema. Neurological exam shows no focal neurological deficits. Skin exam no rashes, ecchymoses or petechia.  Lab Results  Component Value Date   WBC 8.1 11/06/2014   HGB 5.8* 11/06/2014   HCT 23.6* 11/06/2014   MCV 70* 11/06/2014   PLT 239 11/06/2014     Chemistry      Component Value Date/Time   NA 141 10/09/2014 1316   NA 141 09/11/2014 1058   NA 143 06/05/2014 1312   K 4.1 10/09/2014 1316   K 3.8 09/11/2014 1058   K 3.3 06/05/2014 1312   CL 101 09/11/2014 1058   CL 102 06/05/2014 1312   CO2 33* 10/09/2014 1316   CO2 32 09/11/2014 1058   CO2 32 06/05/2014 1312   BUN 10.7 10/09/2014 1316   BUN 7 09/11/2014 1058   BUN 6* 06/05/2014 1312   CREATININE 0.9 10/09/2014 1316   CREATININE 0.80 09/11/2014 1058   CREATININE 0.8 06/05/2014 1312      Component Value Date/Time   CALCIUM 9.4 10/09/2014 1316   CALCIUM 9.0 09/11/2014 1058   CALCIUM 8.9 06/05/2014 1312   ALKPHOS 109 10/09/2014 1316   ALKPHOS 98 09/11/2014 1058   ALKPHOS 100* 06/05/2014 1312   AST 16 10/09/2014 1316   AST 13 09/11/2014 1058   AST 22 06/05/2014 1312   ALT <6 10/09/2014 1316  ALT <8 09/11/2014 1058   ALT 9* 06/05/2014 1312   BILITOT 0.52 10/09/2014 1316   BILITOT 0.6 09/11/2014 1058   BILITOT 0.60 06/05/2014 1312         Impression and Plan: [name] is 68 year old female with polycythemia vera. She is hyperviscous. She does very well with her hemoglobin below 6. We will not have to phlebotomize her today.   Overall, I don't see any new issues with her. She does have an element of depression. She does not want to take anything for this. She will take some herbal supplements. I don't think this would be detrimental to her.  We will continue have her come in every 2 weeks for lab work.  I  will see her back in 2 more months.  Volanda Napoleon, MD 8/10/20164:05 PM

## 2014-11-06 NOTE — Telephone Encounter (Signed)
Critical Value HGB 5.8 Dr Marin Olp notified. No orders at this time.

## 2014-11-07 LAB — IRON AND TIBC CHCC
%SAT: 2 % — ABNORMAL LOW (ref 21–57)
IRON: 11 ug/dL — AB (ref 41–142)
TIBC: 504 ug/dL — AB (ref 236–444)
UIBC: 493 ug/dL — ABNORMAL HIGH (ref 120–384)

## 2014-11-07 LAB — FERRITIN CHCC: FERRITIN: 6 ng/mL — AB (ref 9–269)

## 2014-11-13 ENCOUNTER — Encounter: Payer: Self-pay | Admitting: Family

## 2014-11-14 ENCOUNTER — Encounter: Payer: Self-pay | Admitting: Family

## 2014-11-14 ENCOUNTER — Ambulatory Visit (HOSPITAL_COMMUNITY)
Admission: RE | Admit: 2014-11-14 | Discharge: 2014-11-14 | Disposition: A | Discharge status: Home / Self Care | Payer: Medicare Other | Source: Ambulatory Visit | Attending: Family | Admitting: Family

## 2014-11-14 ENCOUNTER — Ambulatory Visit (INDEPENDENT_AMBULATORY_CARE_PROVIDER_SITE_OTHER): Payer: Medicare Other | Admitting: Family

## 2014-11-14 ENCOUNTER — Other Ambulatory Visit: Payer: Self-pay | Admitting: Family

## 2014-11-14 VITALS — BP 103/57 | HR 66 | Temp 97.5°F | Resp 16 | Ht 62.5 in | Wt 154.0 lb

## 2014-11-14 DIAGNOSIS — I6523 Occlusion and stenosis of bilateral carotid arteries: Secondary | ICD-10-CM

## 2014-11-14 DIAGNOSIS — Z9889 Other specified postprocedural states: Secondary | ICD-10-CM

## 2014-11-14 DIAGNOSIS — Z48812 Encounter for surgical aftercare following surgery on the circulatory system: Secondary | ICD-10-CM

## 2014-11-14 DIAGNOSIS — I6521 Occlusion and stenosis of right carotid artery: Secondary | ICD-10-CM | POA: Insufficient documentation

## 2014-11-14 NOTE — Patient Instructions (Signed)
Stroke Prevention Some medical conditions and behaviors are associated with an increased chance of having a stroke. You may prevent a stroke by making healthy choices and managing medical conditions. HOW CAN I REDUCE MY RISK OF HAVING A STROKE?   Stay physically active. Get at least 30 minutes of activity on most or all days.  Do not smoke. It may also be helpful to avoid exposure to secondhand smoke.  Limit alcohol use. Moderate alcohol use is considered to be:  No more than 2 drinks per day for men.  No more than 1 drink per day for nonpregnant women.  Eat healthy foods. This involves:  Eating 5 or more servings of fruits and vegetables a day.  Making dietary changes that address high blood pressure (hypertension), high cholesterol, diabetes, or obesity.  Manage your cholesterol levels.  Making food choices that are high in fiber and low in saturated fat, trans fat, and cholesterol may control cholesterol levels.  Take any prescribed medicines to control cholesterol as directed by your health care provider.  Manage your diabetes.  Controlling your carbohydrate and sugar intake is recommended to manage diabetes.  Take any prescribed medicines to control diabetes as directed by your health care provider.  Control your hypertension.  Making food choices that are low in salt (sodium), saturated fat, trans fat, and cholesterol is recommended to manage hypertension.  Take any prescribed medicines to control hypertension as directed by your health care provider.  Maintain a healthy weight.  Reducing calorie intake and making food choices that are low in sodium, saturated fat, trans fat, and cholesterol are recommended to manage weight.  Stop drug abuse.  Avoid taking birth control pills.  Talk to your health care provider about the risks of taking birth control pills if you are over 35 years old, smoke, get migraines, or have ever had a blood clot.  Get evaluated for sleep  disorders (sleep apnea).  Talk to your health care provider about getting a sleep evaluation if you snore a lot or have excessive sleepiness.  Take medicines only as directed by your health care provider.  For some people, aspirin or blood thinners (anticoagulants) are helpful in reducing the risk of forming abnormal blood clots that can lead to stroke. If you have the irregular heart rhythm of atrial fibrillation, you should be on a blood thinner unless there is a good reason you cannot take them.  Understand all your medicine instructions.  Make sure that other conditions (such as anemia or atherosclerosis) are addressed. SEEK IMMEDIATE MEDICAL CARE IF:   You have sudden weakness or numbness of the face, arm, or leg, especially on one side of the body.  Your face or eyelid droops to one side.  You have sudden confusion.  You have trouble speaking (aphasia) or understanding.  You have sudden trouble seeing in one or both eyes.  You have sudden trouble walking.  You have dizziness.  You have a loss of balance or coordination.  You have a sudden, severe headache with no known cause.  You have new chest pain or an irregular heartbeat. Any of these symptoms may represent a serious problem that is an emergency. Do not wait to see if the symptoms will go away. Get medical help at once. Call your local emergency services (911 in [place] Do not drive yourself to the hospital. Document Released: 04/22/2004 Document Revised: 07/30/2013 Document Reviewed: 09/15/2012 Exit[employer] Patient Information 2015 [employer]. This information is not intended to replace advice given   to you by your health care provider. Make sure you discuss any questions you have with your health care provider.  

## 2014-11-14 NOTE — Progress Notes (Signed)
Established Carotid Patient   History of Present Illness  [name] is a 69 y.o. female patient of Dr. Oneida Alar who returns for followup today after left carotid endarterectomy October 10, 2012. This was done with a saphenous vein patch. She had a seizure several days after discharge from the hospital. She was readmitted [place] and placed on Keppra; she is concerned about the cost of Keppra. She has recovered since then with no further seizures. She still has some weakness and clumsiness of her right hand.  She states her memory has improved since the left CEA.  Had a stroke in 2001, had a clot that went to left eye and damaged the retina, has some peripheral vision in left eye.  Had a second stroke July 7 or 9, 2014, then had the left CEA., she has had no further stroke or TIA activity. She states that she feels stronger than at her last visit. She states that her blood pressure gets high at times, she takes her blood pressure medication when her pressure is high, as directed by her cardiologist pt reports.  Has mild right sided weakness since the second stroke.  She has no expressive aphasia.  Pt reports New Medical or Surgical History: was taken off her HRT, states she feels worse since then  Pt Diabetic: No  Pt smoker: non-smoker  Pt meds include:  Statin : Yes  ASA: Yes  Other anticoagulants/antiplatelets: Plavix    Past Medical History  Diagnosis Date  . Fibromyalgia     chronic pain syndrome  . Postmenopausal state     on hormone replacement therapy  . Aortic valve sclerosis     by echocardiogram 12/04/2009  . Hyperlipidemia     "not since carotid OR" (12/28/2012)  . 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 GERD (gastroesophageal reflux disease)   . PAC (premature atrial contraction)   . Chronic constipation     takes Mineral Oil,Juice,Enema(prn),and Miralax(Prn) and Cascara nightly  . Insomnia     takes  restoril nightly and Xanax  . Hypothyroidism   . Seasonal allergies     takes Allegra in am and Benadryl at night  . Occlusion and stenosis of carotid artery without mention of cerebral infarction 10/26/2012  . 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 Acute cholecystitis 10/07/2012    s/p pec drain due to recent CVA, pending chole 11/2012  . Difficult intubation     grade 3 airway  . PONV (postoperative nausea and vomiting)     "I vomit for 5 days straight w/certain RX w/thanes" (12/28/2012)  . Hypertension     "not since carotid OR" (12/28/2012)  . Heart murmur     "flow mumur" (12/28/2012)  . Exertional dyspnea   . Asthma     "wheeze occasionally; I don't have asthma" (12/28/2012)  . Anemia     "due to polycythemia vera" (12/28/2012)  . History of blood transfusion 09/2012; 12/28/2012  . Seizures     only seizure was 10/14/12 "after carotid endarterectomy";takes Keppra daily  . Stroke 09/2012    "after they put drain in my gallbladder"; residual is "haven't felt well enough to tell since stroke to tell; weak already; weaker when I'm tired" (12/28/2012)  . Chronic back pain   . Anxiety   . 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 Melanoma of chin 2001    Right chin    Social History Social  History  Substance Use Topics  . Smoking status: Never Smoker   . Smokeless tobacco: Never Used     Comment: never used tobacco  . Alcohol Use: No    Family History Family History  Problem Relation Age of Onset  . Heart attack Father   . Heart disease Father     Heart Disease before age 8  . Coronary artery disease Mother     had aortic valve replacement  . Heart disease Mother   . Heart attack Mother   . Hyperlipidemia Sister   . Hypertension Sister     Surgical History Past Surgical History  Procedure Laterality Date  . Endarterectomy Left 10/10/2012    Procedure: ENDARTERECTOMY CAROTID- LEFT NECK WITH GREATER SAPHENOUS VEIN PATCH LEFT LEG;  Surgeon: Elam Dutch, MD;  Location: [facility];  Service: Vascular;  Laterality: Left;  . Carotid endarterectomy Left 10-10-12  . Abdominal hysterectomy  1996  . Tonsillectomy      age 20  . Nasal septum surgery      late 70's  . Colonoscopy    . Insertion of drain  10/02/12    right low abdomen and draining pus  . Picc line placed      10/15/12  . Tubal ligation  ~ 1972  . Esophagogastroduodenoscopy N/A 01/02/2013    Procedure: ESOPHAGOGASTRODUODENOSCOPY (EGD);  Surgeon: Inda Castle, MD;  Location: [facility];  Service: Endoscopy;  Laterality: N/A;  . Cholecystectomy N/A 11/29/2012    Procedure: ATTEMPTED LAPAROSCOPIC CHOLECYSTECTOMY ;  Surgeon: Gwenyth Ober, MD;  Location: [facility];  Service: General;  Laterality: N/A;  . Cholecystectomy N/A 11/29/2012    Procedure: CHOLECYSTECTOMY, COMMON BILE DUCT EXPLORATION, T-TUBE PLACEMENT;  Surgeon: Gwenyth Ober, MD;  Location: [facility];  Service: General;  Laterality: N/A;  . Colonoscopy N/A 05/14/2013    Procedure: COLONOSCOPY;  Surgeon: Inda Castle, MD;  Location: [facility];  Service: Endoscopy;  Laterality: N/A;    Allergies  Allergen Reactions  . Ethrane [Enflurane] Nausea And Vomiting  . Codeine Nausea Only  . Synthroid [Levothyroxine Sodium] Other (See Comments)    Not effective. Causes excessive sleepiness. Takes armour thyroid  . Calcium Channel Blockers Palpitations  . Penicillins Rash    Current Outpatient Prescriptions  Medication Sig Dispense Refill  . ALPRAZolam (XANAX) 0.5 MG tablet TAKE 1 TABLET BY MOUTH EVERY 6 HOURS AS NEEDED 120 tablet 2  . aspirin 325 MG tablet Take 325 mg by mouth daily after breakfast. "to thin  Blood"    . azithromycin (ZITHROMAX) 500 MG tablet Take by mouth daily. 2 tabs first 3 day the 1 till done (Pt's has completed dose, however husband does not wants this taken off med list)    . bisoprolol (ZEBETA) 10 MG tablet TAKE 1 TABLET BY MOUTH EVERY DAY 60 tablet 1  . Cascara [place] 450 MG CAPS Take 900 mg by mouth at  bedtime.    . clopidogrel (PLAVIX) 75 MG tablet TAKE 1 TABLET (75 MG TOTAL) BY MOUTH DAILY. 30 tablet 3  . COMBIVENT RESPIMAT 20-100 MCG/ACT AERS respimat INHALE 2 PUFFS 4 TIMES DAILY 2 Inhaler 0  . cyanocobalamin (,VITAMIN B-12,) 1000 MCG/ML injection Inject 1,000 mcg into the muscle as needed (for energy).    . cyclobenzaprine (FLEXERIL) 10 MG tablet Take 10 mg by mouth 3 (three) times daily as needed for muscle spasms (FOR LOWER BACK MUSCLE SPASMS).    Marland Kitchen diphenhydrAMINE (BENADRYL) 25 MG tablet Take 50 mg by  mouth every 8 (eight) hours as needed. Patient may take 50 mg three times a day as needed for allergies    . docusate sodium (COLACE) 100 MG capsule Take 100 mg by mouth 3 (three) times daily with meals.     . famotidine (PEPCID) 20 MG tablet TAKE 1 TABLET BY MOUTH TWICE A DAY BEFORE A MEAL *4/29* 60 tablet 6  . fexofenadine (ALLEGRA) 180 MG tablet Take 180 mg by mouth daily after breakfast.     . furosemide (LASIX) 40 MG tablet Take 1 tablet (40 mg total) by mouth 2 (two) times daily. 60 tablet 11  . Garlic Oil 7408 MG CAPS Take 1,000 mg by mouth daily after breakfast.    . hydrochlorothiazide (MICROZIDE) 12.5 MG capsule TAKE ONE CAPSULE BY MOUTH EVERY DAY AS NEEDED IF SYSTOLIC IS GREATER THAN 144 30 capsule 6  . levETIRAcetam (KEPPRA) 500 MG tablet TAKE 1 TABLET BY MOUTH TWICE A DAY 60 tablet 0  . lidocaine-prilocaine (EMLA) cream Apply 1 application topically as needed (to numb the phlebotomy site). 30 g 6  . lisinopril (PRINIVIL,ZESTRIL) 10 MG tablet Take 10 mg by mouth 2 (two) times daily as needed. If Systolic is GREATER than 818    . magnesium oxide (MAG-OX) 400 MG tablet Take 1,200 mg by mouth daily.    . Methylsulfonylmethane (MSM) 1000 MG CAPS Take 3 capsules by mouth daily. With breakfast    . mineral oil liquid Take 60 mLs by mouth at bedtime. With Juice    . nitroGLYCERIN (NITROSTAT) 0.4 MG SL tablet Place 0.4 mg under the tongue every 5 (five) minutes as needed for chest pain.     Marland Kitchen ondansetron (ZOFRAN) 8 MG tablet Take by mouth every 8 (eight) hours as needed for nausea or vomiting.    . OxyCODONE (OXYCONTIN) 10 mg T12A 12 hr tablet Take 1 pill at 8AM and 4PM 60 tablet 0  . OxyCODONE (OXYCONTIN) 20 mg T12A 12 hr tablet BRAND NAME MEDICALLY NECESSARY! Take at noon, 8 pm and 4 am. 90 tablet 0  . oxyCODONE (ROXICODONE) 15 MG immediate release tablet Take 1 pill, IF NEEDED, every 8 hours for pain 75 tablet 0  . pantoprazole (PROTONIX) 40 MG tablet Take 1 tablet (40 mg total) by mouth daily. 30 tablet 0  . potassium chloride (MICRO-K) 10 MEQ CR capsule TAKE 1 CAPSULE 8 TIMES A DAY 240 capsule prn  . promethazine (PHENERGAN) 25 MG tablet Take 25 mg by mouth every 6 (six) hours as needed for nausea.    . Sennosides (SENNA LAX PO) Take by mouth 3 (three) times daily after meals.    . temazepam (RESTORIL) 30 MG capsule TAKE ONE CAPSULE BY MOUTH AT BEDTIME 30 capsule 5  . thyroid (ARMOUR) 60 MG tablet Take 2.5 tablets (150 mg total) by mouth daily after breakfast. Levothyroxine is not effective. Takes only armour thyroid. 75 tablet 11  . triamcinolone cream (KENALOG) 0.1 % Apply 1 application topically 2 (two) times daily as needed (to skin).    . Valerian 100 MG CAPS Take 100 mg by mouth at bedtime as needed. Take for sleep    . Vitamin D, Cholecalciferol, 1000 UNITS TABS Take 4,000 Units by mouth every morning.     No current facility-administered medications for this visit.    Review of Systems : See HPI for pertinent positives and negatives.  Physical Examination  Filed Vitals:   11/14/14 1156 11/14/14 1158 11/14/14 1201  BP: 108/63 108/63 103/57  Pulse:  65 65 66  Temp:   97.5 F (36.4 C)  TempSrc:   Oral  Resp:   16  Height:  5' 2.5" (1.588 m) 5' 2.5" (1.588 m)  Weight:  154 lb (69.854 kg) 154 lb (69.854 kg)  SpO2:  100% 100%   Body mass index is 27.7 kg/(m^2).   General: WDWN female in NAD  GAIT: normal  Eyes: PERRLA  Pulmonary: Non-labored, CTAB,  Negative Rales, Negative rhonchi, & Negative wheezing. Decreased air movement in right base.  Cardiac: regular Rhythm, positive  murmur.   VASCULAR EXAM  Carotid Bruits  Left  Right    positive Negative   Aorta is not palpable.  Radial pulses are 1+ palpable and equal.   LE Pulses  LEFT  RIGHT   POPLITEAL  not palpable  not palpable   POSTERIOR TIBIAL  palpable  palpable   DORSALIS PEDIS  ANTERIOR TIBIAL  palpable  palpable    Gastrointestinal: no tenderness, no masses palpated.  Musculoskeletal: Negative muscle atrophy/wasting. M/S 4/5 throughout, Extremities without ischemic changes.  Neurologic: A&O X 3; Appropriate Affect,  Speech is normal  CN 2-12 intact, Pain and light touch intact in extremities, Motor exam as listed above.         Non-Invasive Vascular Imaging CAROTID DUPLEX 11/14/2014   CEREBROVASCULAR DUPLEX EVALUATION    INDICATION: Carotid artery disease    PREVIOUS INTERVENTION(S): Fibromyalgia. Left carotid endarterectomy 10/10/2012    DUPLEX EXAM: Carotid duplex    RIGHT  LEFT  Peak Systolic Velocities (cm/s) End Diastolic Velocities (cm/s) Plaque LOCATION Peak Systolic Velocities (cm/s) End Diastolic Velocities (cm/s) Plaque  127 35 - CCA PROXIMAL 166 31 -  112 27 - CCA MID 117 28 -  110 28 HT CCA DISTAL 115 29 -  114 16 - ECA 99 16 -  140 44 - ICA PROXIMAL 98 33 -  153 46 - ICA MID 125 40 -  147 45 - ICA DISTAL 118 39 -    1.3 ICA / CCA Ratio (PSV) N/A  Antegrade Vertebral Flow Antegrade  163 Brachial Systolic Pressure (mmHg) 8466.  Triphasic Brachial Artery Waveforms Triphasic    Plaque Morphology:  HM = Homogeneous, HT = Heterogeneous, CP = Calcific Plaque, SP = Smooth Plaque, IP = Irregular Plaque  ADDITIONAL FINDINGS:     IMPRESSION: 1. Increased velocity  in the 40 - 59% stenosis range in the right mid to distal internal carotid artery, with no plaque visualized 2. Patent left carotid endarterectomy site with no  evidence of restenosis.     Compared to the previous exam:  No significant change since prior exam of 11/07/2013      Assessment: TANYSHA QUANT is a 69 y.o. female who is s/p  left carotid endarterectomy October 10, 2012. She had a stroke in 2001, had a clot that went to left eye and damaged the retina, has some peripheral vision in left eye.  She had a second stroke July 7 or 9, 2014, then had the left CEA., she has had no further stroke or TIA activity. Today's carotid Duplex suggests 40 - 59% stenosis in the right mid to distal internal carotid artery, with no plaque visualized and a patent left carotid endarterectomy site with no evidence of restenosis. No significant change since prior exam of 11/07/2013.   Plan: Follow-up in 1 year with Carotid Duplex.   I discussed in depth with the patient the nature of atherosclerosis, and emphasized the importance of maximal medical management including  strict control of blood pressure, blood glucose, and lipid levels, obtaining regular exercise, and continued cessation of smoking.  The patient is aware that without maximal medical management the underlying atherosclerotic disease process will progress, limiting the benefit of any interventions. The patient was given information about stroke prevention and what symptoms should prompt the patient to seek immediate medical care. Thank you for allowing [place] to participate in this patient's care.  Clemon Chambers, RN, MSN, FNP-C [facility] Office: [phone]  Clinic Physician: Early  11/14/2014 11:58 AM

## 2014-11-15 NOTE — Addendum Note (Signed)
Addended by: Mena Goes on: 11/15/2014 05:16 PM   Modules accepted: Orders

## 2014-11-18 ENCOUNTER — Other Ambulatory Visit: Payer: Self-pay | Admitting: *Deleted

## 2014-11-18 DIAGNOSIS — D45 Polycythemia vera: Secondary | ICD-10-CM

## 2014-11-18 DIAGNOSIS — M797 Fibromyalgia: Secondary | ICD-10-CM

## 2014-11-18 MED ORDER — OXYCODONE HCL ER 20 MG PO T12A: EXTENDED_RELEASE_TABLET | ORAL | Status: DC

## 2014-11-18 MED ORDER — OXYCODONE HCL 15 MG PO TABS: ORAL_TABLET | ORAL | Status: DC

## 2014-11-18 MED ORDER — OXYCODONE HCL ER 10 MG PO T12A: EXTENDED_RELEASE_TABLET | ORAL | Status: DC

## 2014-11-20 ENCOUNTER — Telehealth: Payer: Self-pay | Admitting: *Deleted

## 2014-11-20 ENCOUNTER — Other Ambulatory Visit (HOSPITAL_BASED_OUTPATIENT_CLINIC_OR_DEPARTMENT_OTHER): Payer: Medicare Other

## 2014-11-20 DIAGNOSIS — I350 Nonrheumatic aortic (valve) stenosis: Secondary | ICD-10-CM | POA: Diagnosis not present

## 2014-11-20 DIAGNOSIS — D45 Polycythemia vera: Secondary | ICD-10-CM

## 2014-11-20 LAB — COMPREHENSIVE METABOLIC PANEL
ALK PHOS: 96 U/L (ref 33–130)
ALT: 8 U/L (ref 6–29)
AST: 16 U/L (ref 10–35)
Albumin: 3.8 g/dL (ref 3.6–5.1)
BUN: 10 mg/dL (ref 7–25)
CO2: 30 mmol/L (ref 20–31)
Calcium: 9 mg/dL (ref 8.6–10.4)
Chloride: 99 mmol/L (ref 98–110)
Creatinine, Ser: 0.7 mg/dL (ref 0.60–0.93)
GLUCOSE: 151 mg/dL — AB (ref 65–99)
POTASSIUM: 3.3 mmol/L — AB (ref 3.5–5.3)
Sodium: 139 mmol/L (ref 135–146)
Total Bilirubin: 0.5 mg/dL (ref 0.2–1.2)
Total Protein: 6.8 g/dL (ref 6.1–8.1)

## 2014-11-20 LAB — CBC WITH DIFFERENTIAL (CANCER CENTER ONLY)
BASO#: 0 10*3/uL (ref 0.0–0.2)
BASO%: 0.3 % (ref 0.0–2.0)
EOS%: 9.9 % — AB (ref 0.0–7.0)
Eosinophils Absolute: 0.4 10*3/uL (ref 0.0–0.5)
HCT: 23.9 % — ABNORMAL LOW (ref 34.8–46.6)
HGB: 5.9 g/dL — CL (ref 11.6–15.9)
LYMPH#: 0.7 10*3/uL — ABNORMAL LOW (ref 0.9–3.3)
LYMPH%: 19.6 % (ref 14.0–48.0)
MCH: 17.3 pg — ABNORMAL LOW (ref 26.0–34.0)
MCHC: 24.7 g/dL — ABNORMAL LOW (ref 32.0–36.0)
MCV: 70 fL — AB (ref 81–101)
MONO#: 0.5 10*3/uL (ref 0.1–0.9)
MONO%: 12.1 % (ref 0.0–13.0)
NEUT#: 2.2 10*3/uL (ref 1.5–6.5)
NEUT%: 58.1 % (ref 39.6–80.0)
PLATELETS: 175 10*3/uL (ref 145–400)
RBC: 3.41 10*6/uL — ABNORMAL LOW (ref 3.70–5.32)
RDW: 20.7 % — AB (ref 11.1–15.7)
WBC: 3.7 10*3/uL — ABNORMAL LOW (ref 3.9–10.0)

## 2014-11-20 NOTE — Telephone Encounter (Signed)
Critical Value HGB 5.9 Dr Marin Olp notified

## 2014-11-21 ENCOUNTER — Telehealth: Payer: Self-pay | Admitting: Cardiology

## 2014-11-21 ENCOUNTER — Ambulatory Visit (HOSPITAL_BASED_OUTPATIENT_CLINIC_OR_DEPARTMENT_OTHER): Payer: Medicare Other

## 2014-11-21 VITALS — BP 114/67 | HR 74 | Temp 97.8°F

## 2014-11-21 DIAGNOSIS — D45 Polycythemia vera: Secondary | ICD-10-CM | POA: Diagnosis present

## 2014-11-21 NOTE — Progress Notes (Signed)
[name] presents today for phlebotomy per MD orders. Phlebotomy procedure started at 1145 and ended at 1200. 500 grams removed. Patient observed for 30 minutes after procedure without any incident. Patient tolerated procedure well. IV needle removed intact.

## 2014-11-21 NOTE — Telephone Encounter (Signed)
Patient rescheduled ov from tomorrow to October, states she is doing ok

## 2014-11-21 NOTE — Telephone Encounter (Signed)
New Message  Pt requested to speak w/ Rip Harbour- would not go into detail. Please call back and discuss.

## 2014-11-21 NOTE — Patient Instructions (Signed)

## 2014-11-22 ENCOUNTER — Ambulatory Visit: Payer: BLUE CROSS/BLUE SHIELD | Admitting: Cardiology

## 2014-11-25 IMAGING — US US ABDOMEN COMPLETE
1 series · 14 of 25 positions shown · non-contrast
Comparison: None.

CLINICAL DATA: Mid epigastric pain.

 ABDOMINAL ULTRASOUND COMPLETE

[Series 1: us abdomen complete · 0.30mm/px · 14 of 46 slices shown]
[im 1/46]
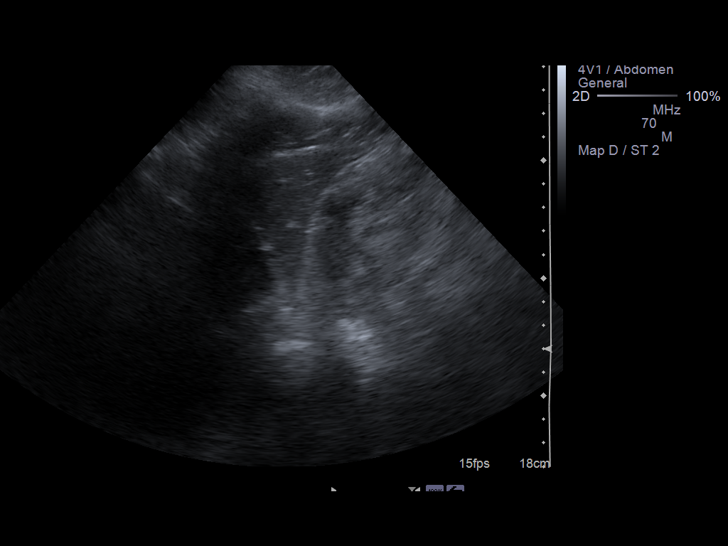
[im 4/46]
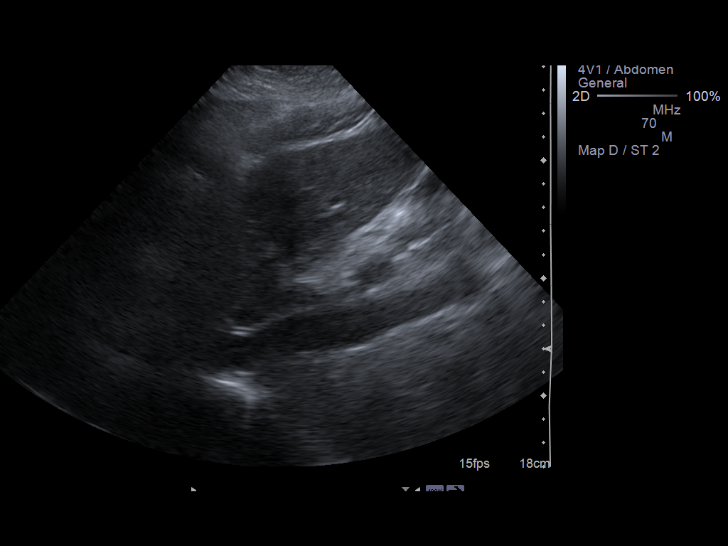
[im 8/46]
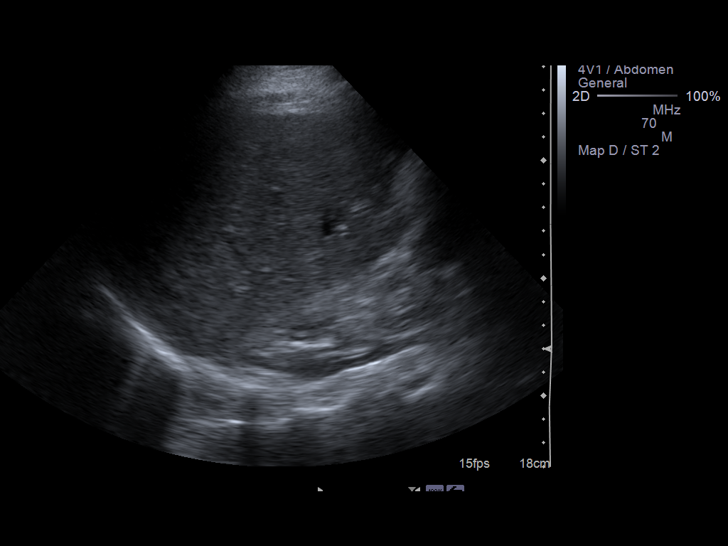
[im 12/46]
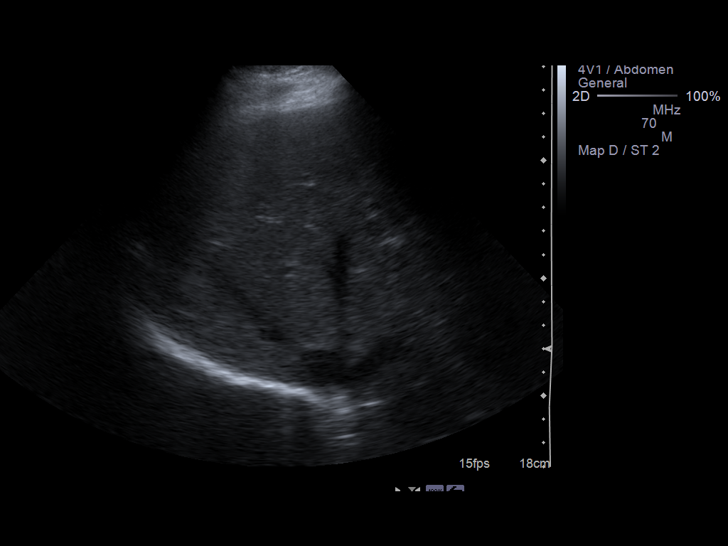
[im 16/46]
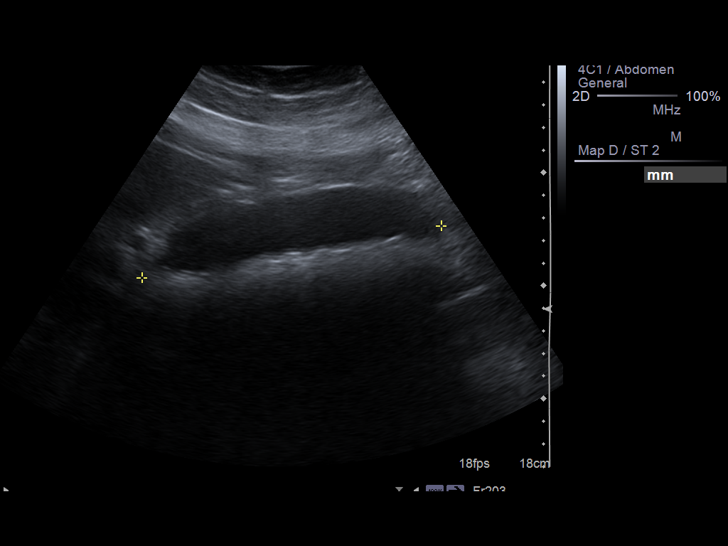
[im 17/46]
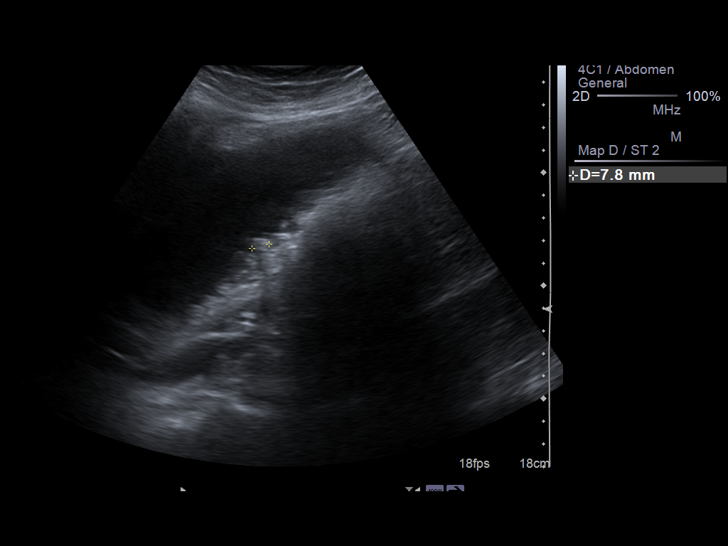
[im 21/46]
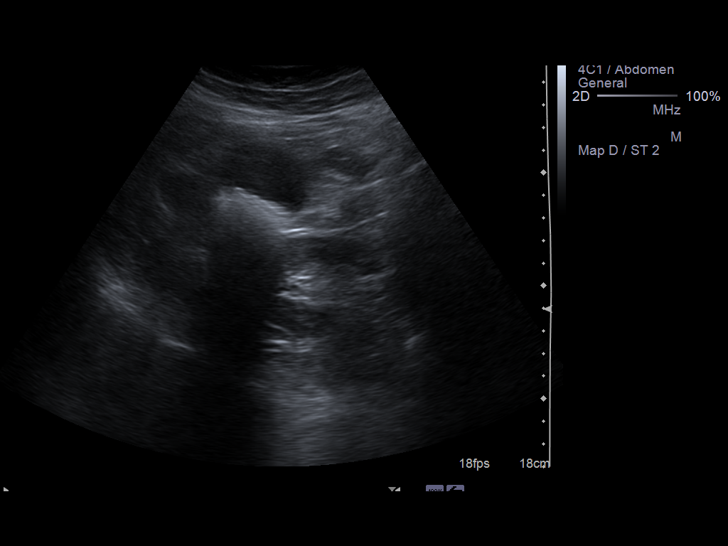
[im 25/46]
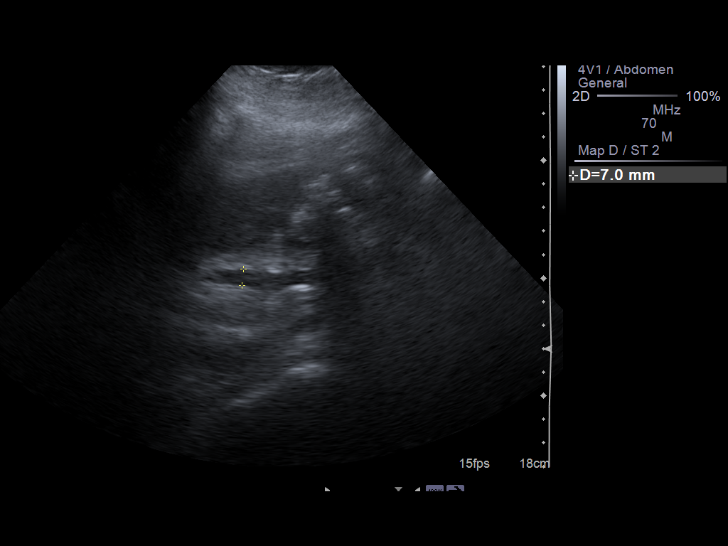
[im 29/46]
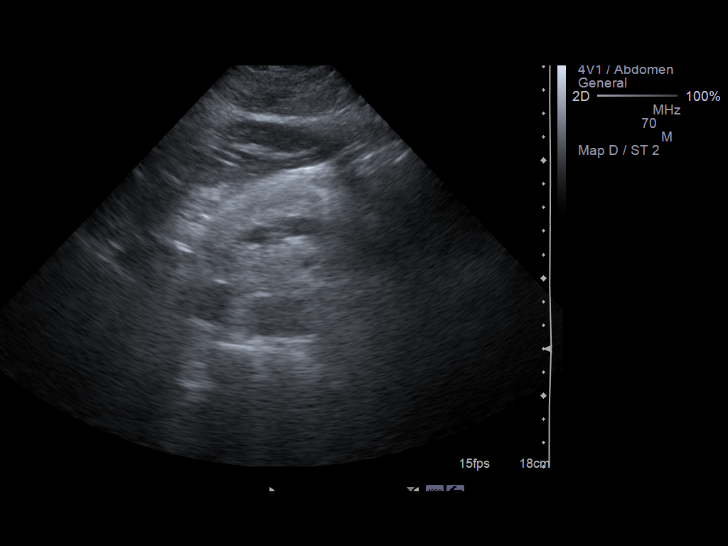
[im 31/46]
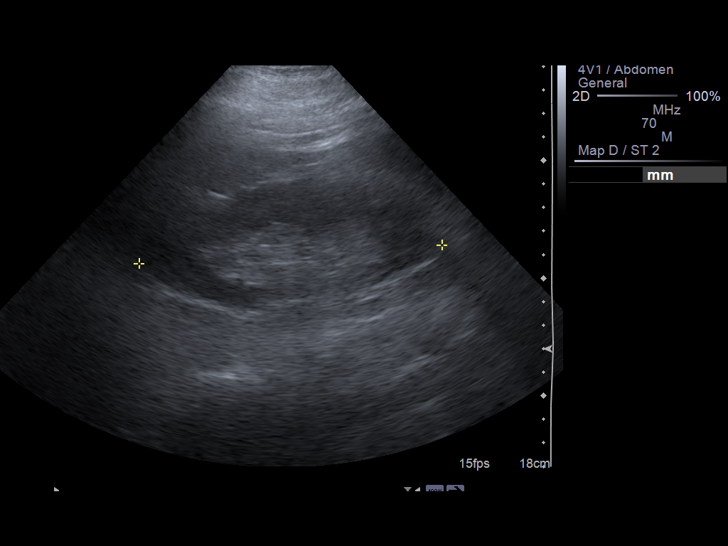
[im 34/46]
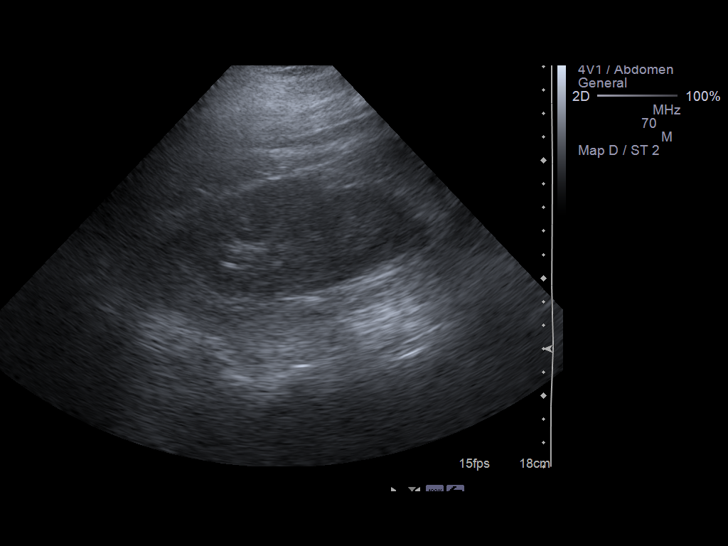
[im 38/46]
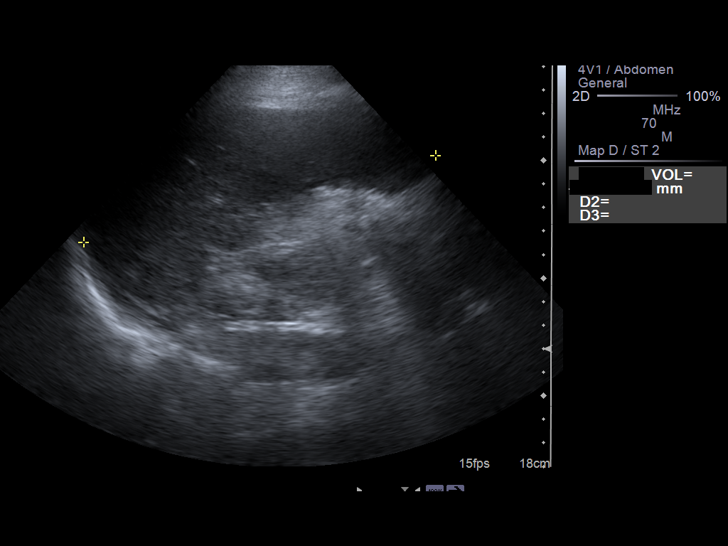
[im 42/46]
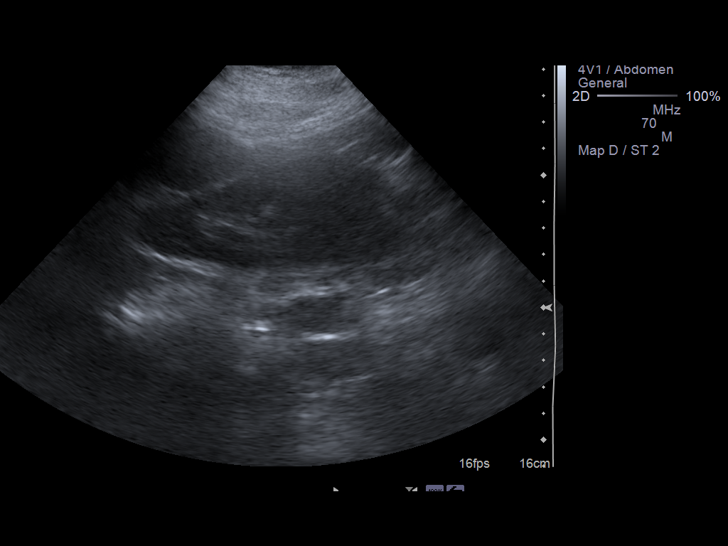
[im 46/46]
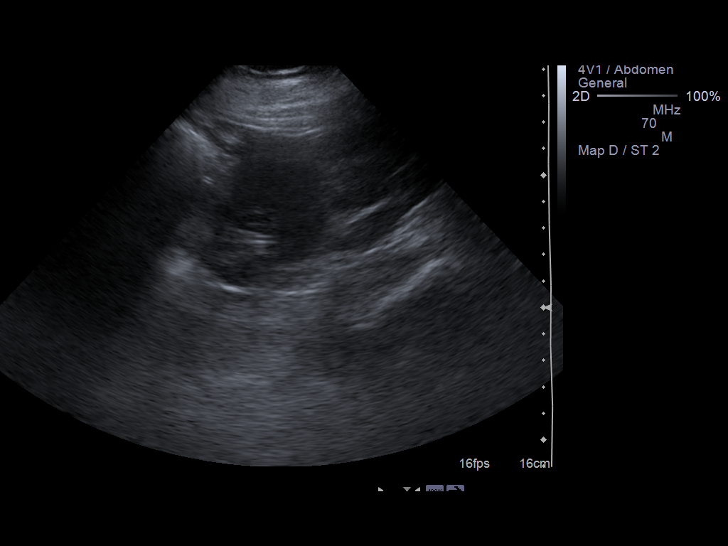

[14 of 25 positions shown; findings below may reference images not displayed]

FINDINGS: Gallbladder:  Multiple stones are identified within the gallbladder
measuring up to 8 mm.  No gallbladder wall thickening or
pericholecystic fluid.  Negative sonographic Murphy's sign.

Common Bile Duct:  The common bile duct is increased in caliber
measuring up to 8.2 mm.

Liver: No focal mass lesion identified.  Within normal limits in
parenchymal echogenicity.

IVC:  Appears normal.

Pancreas:  No abnormality identified.

Spleen:  Within normal limits in size and echotexture.

Right kidney:  Normal in size and parenchymal echogenicity.  No
evidence of mass or hydronephrosis.

Left kidney:  Normal in size and parenchymal echogenicity.  No
evidence of mass or hydronephrosis.

Abdominal Aorta:  No aneurysm identified.
IMPRESSION: 1.  Gallstones.
2.  Increased caliber of the common bile duct which measures up to
8.2 mm.
If there is clinical concern for choledocholithiasis an MRCP may be
helpful.

## 2014-11-28 ENCOUNTER — Ambulatory Visit (INDEPENDENT_AMBULATORY_CARE_PROVIDER_SITE_OTHER): Payer: Medicare Other | Admitting: Neurology

## 2014-11-28 ENCOUNTER — Encounter: Payer: Self-pay | Admitting: Neurology

## 2014-11-28 VITALS — BP 118/69 | HR 84 | Ht 62.0 in | Wt 159.6 lb

## 2014-11-28 DIAGNOSIS — R569 Unspecified convulsions: Secondary | ICD-10-CM | POA: Diagnosis not present

## 2014-11-28 DIAGNOSIS — I6523 Occlusion and stenosis of bilateral carotid arteries: Secondary | ICD-10-CM | POA: Diagnosis not present

## 2014-11-28 MED ORDER — LEVETIRACETAM 250 MG PO TABS: 500.0000 mg | ORAL_TABLET | Freq: Two times a day (BID) | ORAL | Status: DC

## 2014-11-28 NOTE — Patient Instructions (Signed)
I had a long d/w patient about her remote stroke and seizures, risk for recurrent stroke/TIAs, personally independently reviewed imaging studies and stroke evaluation results and answered questions.Continue Plavix}  for secondary stroke prevention and maintain strict control of hypertension with blood pressure goal below 130/90, diabetes with hemoglobin A1c goal below 6.5% and lipids with LDL cholesterol goal below 100 mg/dL.  agree with reducing the dose of Keppra to 250 mg in the morning and find it at night for 6 months and if no breakthrough seizures reduce further to 250 mg twice daily but stay on that dose for life. I discussed risk for recurrent seizures with the patient, husband and answered questions. Since patient has been free from seizures and strokes for more than 2 years I do not believe routine follow-up appointment is necessary but patient may be referred back in the future as needed.

## 2014-11-28 NOTE — Progress Notes (Signed)
PATIENT: [name] DOB: 1945-06-05   REASON FOR VISIT: follow up for stroke, seizures HISTORY FROM: patient  HISTORY OF PRESENT ILLNESS: [name] is an 69 y.o. female with a history of polycythemia, hypertension and hyperlipidemia who was admitted on 09/30/2012 with acute cholecystitis. She status post drain placement. She experienced sudden onset of weakness involving her right hand as well as change in speech at around noon on 10/04/2012 in the hospital. MRI of her brain performed 10/05/2012 showed left MCA/ACA watershed infarcts. MRA showed markedly diminished flow and possible obstruction of left ICA. Patient has been taking aspirin and Plavix since admission. NIH stroke score was 2. Patient was not a TPA candidate secondary to recent drain placement and lack of recognition of acute stroke.   UPDATE 12/21/12 (LL): [name] comes in for hospital follow up. She is post- cholecystectomy, and post left carotid endarterectomy. She developed seizures after discharge and was readmitted and is on Keppra now, no seizures since. She continues on Plavix and Aspitin 325 mg daily due to polycythemia. Patient denies medication side effects, with no signs of bleeding or excessive bruising. She is getting home PT/OT/ST. She states that she has not recovered the fine-motor skills in her right hand, but right leg is good. She can walk short distances before getting fatigued. She is very weak today, hgb <5, polycythemia is followed by Dr. Marin Olp. She has lost 30 lbs. Since going into the hospital in July.   UPDATE 04/05/13 (LL):  [name] comes in for stroke revisit.  She states she is doing well.  She has not had any recurrent seizures.  Her follow up EEG was negative, but she declined the evoked potential part of the test. Her polycythemia is stable, she had phlebotomy last Friday.  She states that her BP is well controlled, it is 138/68 in office today.  She has colonoscopy scheduled in the next 2  weeks. Doing well. Update 11/28/2014 : She returns for follow-up after last visit year and a half ago. She can just do well without recurrent stroke or TIA symptoms. She remains on Keppra 500 mg twice daily for seizures which is tolerating well but she is questioning whether she needs to stay on it since she's been seizure-free for more than 2 years now. She did have follow-up carotid ultrasound done on 11/08/13 which showed 4059% left ICA stenosis. She informs me that she had even further repeat carotid ultrasound 2 weeks ago in Dr. Oneida Alar office which was unchanged and I do not have those results. She continues to have mild weakness particularly when she is tired. She has polycythemia and gets phlebotomy done every 4-6 weeks. REVIEW OF SYSTEMS: Full 14 system review of systems performed and notable only for:  Positive for runny nose trouble swallowing, drooling, cough, wheezing, shortness of breath, loss of vision, light sensitivity, eye itching, environmental and food allergies, incontinence of bladder, swollen abdomen, joint pain and swelling, back pain, aching muscles, muscle cramps, walking difficulty, neck pain and stiffness, itching, easy bruising, memory loss, tremors and all other systems negative   PHYSICAL EXAM  Filed Vitals:   11/28/14 1327  BP: 118/69  Pulse: 84  Height: 5\' 2"  (1.575 m)  Weight: 159 lb 9.6 oz (72.394 kg)   Body mass index is 29.18 kg/(m^2).  Generalized: Well developed elderly lady, in no acute distress  Head: normocephalic and atraumatic.  Neck: Supple, left carotid bruit Cardiac: Regular rate rhythm,  2/6 systolic murmur  Musculoskeletal:  No deformity   Neurological examination  Mentation: Alert oriented to time, place, history taking. Follows all commands speech and language fluent Cranial nerve II-XII: Pupils were equal round reactive to light extraocular movements were full, visual field were full on confrontational test. Facial sensation and strength were  normal. hearing was intact to finger rubbing bilaterally. Uvula tongue midline. head turning and shoulder shrug and were normal and symmetric.Tongue protrusion into cheek strength was normal. MOTOR: The patient has good strength in all 4 extremities, with exception of slight weakness of the right upper extremity. Decreased fine motor skill in right hand. Left orbits right hand. Sensory: normal and symmetric to light touch Coordination: finger-nose-finger, heel-to-shin bilaterally, no dysmetria Reflexes:  Deep tendon reflexes in the upper and lower extremities are present and symmetric.  Gait and Station: Rising up from seated position without assistance, normal stance, without trunk ataxia, moderate stride, good arm swing, smooth turning, able to perform tiptoe, and heel walking without difficulty. Tandem unsteady.  DIAGNOSTIC DATA (LABS, IMAGING, TESTING) - I reviewed patient records, labs, notes, testing and imaging myself where available.  Lab Results  Component Value Date   WBC 3.7* 11/20/2014   HGB 5.9* 11/20/2014   HCT 23.9* 11/20/2014   MCV 70* 11/20/2014   PLT 175 11/20/2014      Component Value Date/Time   NA 139 11/20/2014 1334   NA 141 10/09/2014 1316   NA 143 06/05/2014 1312   K 3.3* 11/20/2014 1334   K 4.1 10/09/2014 1316   K 3.3 06/05/2014 1312   CL 99 11/20/2014 1334   CL 102 06/05/2014 1312   CO2 30 11/20/2014 1334   CO2 33* 10/09/2014 1316   CO2 32 06/05/2014 1312   GLUCOSE 151* 11/20/2014 1334   GLUCOSE 109 10/09/2014 1316   GLUCOSE 147* 06/05/2014 1312   BUN 10 11/20/2014 1334   BUN 10.7 10/09/2014 1316   BUN 6* 06/05/2014 1312   CREATININE 0.70 11/20/2014 1334   CREATININE 0.9 10/09/2014 1316   CREATININE 0.8 06/05/2014 1312   CALCIUM 9.0 11/20/2014 1334   CALCIUM 9.4 10/09/2014 1316   CALCIUM 8.9 06/05/2014 1312   PROT 6.8 11/20/2014 1334   PROT 7.1 10/09/2014 1316   PROT 6.9 06/05/2014 1312   ALBUMIN 3.8 11/20/2014 1334   ALBUMIN 3.9 10/09/2014  1316   AST 16 11/20/2014 1334   AST 16 10/09/2014 1316   AST 22 06/05/2014 1312   ALT 8 11/20/2014 1334   ALT <6 10/09/2014 1316   ALT 9* 06/05/2014 1312   ALKPHOS 96 11/20/2014 1334   ALKPHOS 109 10/09/2014 1316   ALKPHOS 100* 06/05/2014 1312   BILITOT 0.5 11/20/2014 1334   BILITOT 0.52 10/09/2014 1316   BILITOT 0.60 06/05/2014 1312   GFRNONAA >90 01/01/2013 0619   GFRAA >90 01/01/2013 0619   Lab Results  Component Value Date   CHOL 58 10/07/2012   HDL 11* 10/07/2012   LDLCALC 23 10/07/2012   TRIG 121 10/07/2012   CHOLHDL 5.3 10/07/2012   Lab Results  Component Value Date   HGBA1C 5.5 10/07/2012   Lab Results  Component Value Date   VITAMINB12 1045* 10/06/2012   Lab Results  Component Value Date   TSH 0.051* 10/04/2012    ASSESSMENT AND PLAN Ms. BETHLEHEM LANGSTAFF is a 69 y.o. female who developed right arm weakness and difficulty with speech while hospitalized for a gallbladder attack. 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  Patient with resultant very mild right hemiparesis.   I had a long d/w patient about her remote stroke and seizures, risk for recurrent stroke/TIAs, personally independently reviewed imaging studies and stroke evaluation results and answered questions.Continue Plavix}  for secondary stroke prevention and maintain strict control of hypertension with blood pressure goal below 130/90, diabetes with hemoglobin A1c goal below 6.5% and lipids with LDL cholesterol goal below 100 mg/dL.  agree with reducing the dose of Keppra to 250 mg in the morning and find it at night for 6 months and if no breakthrough seizures reduce further to 250 mg twice daily but stay on that dose for life. I discussed risk for recurrent seizures with the patient, husband and answered questions. Greater than 50% of time during this 25 minute visit was spent on counseling and coordination of care. Since patient has  been free from seizures and strokes for more than 2 years I do not believe routine follow-up appointment is necessary but patient may be referred back in the future as needed.  Antony Contras, MD  11/28/2014, 6:14 PM [facility] [address] [place], [place] [address] [phone]  Note: This document was prepared with digital dictation and possible smart phrase technology. Any transcriptional errors that result from this process are unintentional.

## 2014-12-04 ENCOUNTER — Other Ambulatory Visit (HOSPITAL_BASED_OUTPATIENT_CLINIC_OR_DEPARTMENT_OTHER): Payer: Medicare Other

## 2014-12-04 ENCOUNTER — Ambulatory Visit: Payer: Medicare Other

## 2014-12-04 ENCOUNTER — Telehealth: Payer: Self-pay | Admitting: *Deleted

## 2014-12-04 DIAGNOSIS — D45 Polycythemia vera: Secondary | ICD-10-CM

## 2014-12-04 DIAGNOSIS — I350 Nonrheumatic aortic (valve) stenosis: Secondary | ICD-10-CM | POA: Diagnosis not present

## 2014-12-04 LAB — CBC WITH DIFFERENTIAL (CANCER CENTER ONLY)
BASO#: 0 10*3/uL (ref 0.0–0.2)
BASO%: 0.2 % (ref 0.0–2.0)
EOS%: 9.2 % — AB (ref 0.0–7.0)
Eosinophils Absolute: 0.5 10*3/uL (ref 0.0–0.5)
HCT: 22.5 % — ABNORMAL LOW (ref 34.8–46.6)
HGB: 5.5 g/dL — CL (ref 11.6–15.9)
LYMPH#: 1 10*3/uL (ref 0.9–3.3)
LYMPH%: 17.8 % (ref 14.0–48.0)
MCH: 17 pg — ABNORMAL LOW (ref 26.0–34.0)
MCHC: 24.4 g/dL — ABNORMAL LOW (ref 32.0–36.0)
MCV: 69 fL — AB (ref 81–101)
MONO#: 0.7 10*3/uL (ref 0.1–0.9)
MONO%: 11.8 % (ref 0.0–13.0)
NEUT#: 3.5 10*3/uL (ref 1.5–6.5)
NEUT%: 61 % (ref 39.6–80.0)
PLATELETS: 198 10*3/uL (ref 145–400)
RBC: 3.24 10*6/uL — AB (ref 3.70–5.32)
RDW: 20.8 % — AB (ref 11.1–15.7)
WBC: 5.7 10*3/uL (ref 3.9–10.0)

## 2014-12-04 LAB — COMPREHENSIVE METABOLIC PANEL
ALK PHOS: 97 U/L (ref 33–130)
ALT: 8 U/L (ref 6–29)
AST: 14 U/L (ref 10–35)
Albumin: 4 g/dL (ref 3.6–5.1)
BILIRUBIN TOTAL: 0.6 mg/dL (ref 0.2–1.2)
BUN: 10 mg/dL (ref 7–25)
CO2: 30 mmol/L (ref 20–31)
CREATININE: 0.84 mg/dL (ref 0.60–0.93)
Calcium: 9 mg/dL (ref 8.6–10.4)
Chloride: 95 mmol/L — ABNORMAL LOW (ref 98–110)
GLUCOSE: 132 mg/dL — AB (ref 65–99)
POTASSIUM: 3.1 mmol/L — AB (ref 3.5–5.3)
Sodium: 136 mmol/L (ref 135–146)
TOTAL PROTEIN: 6.9 g/dL (ref 6.1–8.1)

## 2014-12-04 NOTE — Telephone Encounter (Signed)
Critical Value Hgb 5.5 Dr Marin Olp notified. No orders received

## 2014-12-04 NOTE — Progress Notes (Signed)
No phlebotomy today. Hgb 5.5

## 2014-12-18 ENCOUNTER — Other Ambulatory Visit (HOSPITAL_BASED_OUTPATIENT_CLINIC_OR_DEPARTMENT_OTHER): Payer: Medicare Other

## 2014-12-18 ENCOUNTER — Telehealth: Payer: Self-pay | Admitting: Hematology & Oncology

## 2014-12-18 DIAGNOSIS — D45 Polycythemia vera: Secondary | ICD-10-CM

## 2014-12-18 LAB — CBC WITH DIFFERENTIAL (CANCER CENTER ONLY)
BASO#: 0 10*3/uL (ref 0.0–0.2)
BASO%: 0.2 % (ref 0.0–2.0)
EOS%: 7.3 % — AB (ref 0.0–7.0)
Eosinophils Absolute: 0.4 10*3/uL (ref 0.0–0.5)
HEMATOCRIT: 24.7 % — AB (ref 34.8–46.6)
HEMOGLOBIN: 6.1 g/dL — AB (ref 11.6–15.9)
LYMPH#: 0.7 10*3/uL — AB (ref 0.9–3.3)
LYMPH%: 12.8 % — ABNORMAL LOW (ref 14.0–48.0)
MCH: 17.1 pg — ABNORMAL LOW (ref 26.0–34.0)
MCHC: 24.7 g/dL — AB (ref 32.0–36.0)
MCV: 69 fL — AB (ref 81–101)
MONO#: 0.6 10*3/uL (ref 0.1–0.9)
MONO%: 10.9 % (ref 0.0–13.0)
NEUT%: 68.8 % (ref 39.6–80.0)
NEUTROS ABS: 4 10*3/uL (ref 1.5–6.5)
Platelets: 220 10*3/uL (ref 145–400)
RBC: 3.56 10*6/uL — ABNORMAL LOW (ref 3.70–5.32)
RDW: 21.4 % — ABNORMAL HIGH (ref 11.1–15.7)
WBC: 5.8 10*3/uL (ref 3.9–10.0)

## 2014-12-18 LAB — COMPREHENSIVE METABOLIC PANEL (CC13)
ALBUMIN: 3.9 g/dL (ref 3.5–5.0)
ALK PHOS: 116 U/L (ref 40–150)
ANION GAP: 10 meq/L (ref 3–11)
AST: 15 U/L (ref 5–34)
BUN: 13.4 mg/dL (ref 7.0–26.0)
CALCIUM: 9.2 mg/dL (ref 8.4–10.4)
CO2: 30 mEq/L — ABNORMAL HIGH (ref 22–29)
CREATININE: 0.9 mg/dL (ref 0.6–1.1)
Chloride: 98 mEq/L (ref 98–109)
EGFR: 62 mL/min/{1.73_m2} — ABNORMAL LOW (ref >90)
Glucose: 155 mg/dl — ABNORMAL HIGH (ref 70–140)
POTASSIUM: 3.1 meq/L — AB (ref 3.5–5.1)
Sodium: 139 mEq/L (ref 136–145)
Total Bilirubin: 0.76 mg/dL (ref 0.20–1.20)
Total Protein: 7.3 g/dL (ref 6.4–8.3)

## 2014-12-18 NOTE — Telephone Encounter (Signed)
Patient was here in office getting labs drawn.  She stated she wasn't feeling well, so she cx 12/18/14 phlb apt and resch dor 12/19/14

## 2014-12-19 ENCOUNTER — Ambulatory Visit (HOSPITAL_BASED_OUTPATIENT_CLINIC_OR_DEPARTMENT_OTHER): Payer: Medicare Other

## 2014-12-19 ENCOUNTER — Other Ambulatory Visit: Payer: Self-pay | Admitting: Hematology & Oncology

## 2014-12-19 VITALS — BP 91/48 | HR 59 | Temp 97.7°F

## 2014-12-19 DIAGNOSIS — N3090 Cystitis, unspecified without hematuria: Secondary | ICD-10-CM

## 2014-12-19 DIAGNOSIS — D45 Polycythemia vera: Secondary | ICD-10-CM

## 2014-12-19 MED ORDER — CIPROFLOXACIN HCL 500 MG PO TABS: 500.0000 mg | ORAL_TABLET | Freq: Two times a day (BID) | ORAL | Status: DC

## 2014-12-19 NOTE — Progress Notes (Signed)
[name] presents today for phlebotomy per MD orders. Phlebotomy procedure started at 1150 and ended at 1200. 500 grams removed. Patient observed for 30 minutes after procedure without any incident. Patient tolerated procedure well. IV needle removed intact.  Patient discharged at 1230.  Patient states she feels very well. No dizziness or ill effects.

## 2014-12-19 NOTE — Patient Instructions (Signed)

## 2014-12-30 ENCOUNTER — Other Ambulatory Visit: Payer: Self-pay | Admitting: Cardiology

## 2014-12-30 ENCOUNTER — Other Ambulatory Visit: Payer: Self-pay | Admitting: Hematology & Oncology

## 2014-12-30 ENCOUNTER — Ambulatory Visit (INDEPENDENT_AMBULATORY_CARE_PROVIDER_SITE_OTHER): Payer: Medicare Other | Admitting: Cardiology

## 2014-12-30 ENCOUNTER — Encounter: Payer: Self-pay | Admitting: Cardiology

## 2014-12-30 VITALS — BP 122/62 | HR 74 | Ht 62.5 in | Wt 162.0 lb

## 2014-12-30 DIAGNOSIS — I6523 Occlusion and stenosis of bilateral carotid arteries: Secondary | ICD-10-CM

## 2014-12-30 DIAGNOSIS — D45 Polycythemia vera: Secondary | ICD-10-CM | POA: Diagnosis not present

## 2014-12-30 DIAGNOSIS — I119 Hypertensive heart disease without heart failure: Secondary | ICD-10-CM | POA: Diagnosis not present

## 2014-12-30 DIAGNOSIS — E039 Hypothyroidism, unspecified: Secondary | ICD-10-CM

## 2014-12-30 LAB — T4, FREE: FREE T4: 0.87 ng/dL (ref 0.80–1.80)

## 2014-12-30 LAB — TSH: TSH: 0.011 u[IU]/mL — AB (ref 0.350–4.500)

## 2014-12-30 NOTE — Progress Notes (Signed)
Cardiology Office Note   Date:  12/30/2014   ID:  [name], [name] 09-Feb-1946, MRN [id number]  PCP:  Warren Danes, MD  Cardiologist: Darlin Coco MD  No chief complaint on file.     History of Present Illness: [name] is a 69 y.o. female who presents for a four-month office visit.  This pleasant 69 year old Caucasian female is seen for a four month followup office visit. She has a history of polycythemia vera and a history of hypothyroidism. She has a history of aortic valve sclerosis and essential hypertension. She has atypical chest pain and does not have any history of ischemic heart disease. She has a past history of PVCs.  The patient is status post cholecystectomy. Dr. Hulen Skains is her surgeon.  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 She has had a recent left carotid endarterectomy. She had carotid duplex ultrasound on 11/07/13 which showed 40-59% flow reduction in both arteries. She was readmitted to [facility] with some type of atypical focal seizure and is now on Keppra.  She is followed for this by Dr. Leonie Man.  He has reduced her long-term dose of Keppra slightly and if she does well over the next 6 months she has been told that she will have further reduced at that time but that she will always need to be on some dose. She does not have any history of ischemic heart disease. She had an echocardiogram 10/04/12 showing ejection fraction 65-70% with grade 1 diastolic dysfunction. There was elevated pulmonary artery pressure of 53 mm mercury. Since last visit she's had no new cardiac symptoms. She has had no seizures.  She denies any chest pain. No awareness of recent PVCs or palpitations. She is having less daytime somnolence since her thyroid hormone replacement was maintained at a higher level of 2.5 gr of Armour Thyroid daily. She did not do well with a trial of the lesser dose. She is finally off all of her female  hormone replacement therapy. Her last phlebotomy was about 2 weeks ago.  Prior to that she had been experiencing some increased shortness of breath. She currently is on Cipro given to her by Dr.Ennever for upper respiratory infection.  She tried Keflex first but did not tolerate it. She has had some symptoms of depression and decreasing short-term memory.   Past Medical History  Diagnosis Date  . Fibromyalgia     chronic pain syndrome  . Postmenopausal state     on hormone replacement therapy  . Aortic valve sclerosis     by echocardiogram 12/04/2009  . Hyperlipidemia     "not since carotid OR" (12/28/2012)  . 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 GERD (gastroesophageal reflux disease)   . PAC (premature atrial contraction)   . Chronic constipation     takes Mineral Oil,Juice,Enema(prn),and Miralax(Prn) and Cascara nightly  . Insomnia     takes restoril nightly and Xanax  . Hypothyroidism   . Seasonal allergies     takes Allegra in am and Benadryl at night  . Occlusion and stenosis of carotid artery without mention of cerebral infarction 10/26/2012  . 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 Acute cholecystitis 10/07/2012    s/p pec drain due to recent CVA, pending chole 11/2012  . Difficult intubation     grade 3 airway  . PONV (postoperative nausea and vomiting)     "  I vomit for 5 days straight w/certain RX w/thanes" (12/28/2012)  . Hypertension     "not since carotid OR" (12/28/2012)  . Heart murmur     "flow mumur" (12/28/2012)  . Exertional dyspnea   . Asthma     "wheeze occasionally; I don't have asthma" (12/28/2012)  . Anemia     "due to polycythemia vera" (12/28/2012)  . History of blood transfusion 09/2012; 12/28/2012  . Seizures ([place])     only seizure was 10/14/12 "after carotid endarterectomy";takes Keppra daily  . Stroke [facility]) 09/2012    "after they put drain in my gallbladder"; residual is "haven't felt well  enough to tell since stroke to tell; weak already; weaker when I'm tired" (12/28/2012)  . Chronic back pain   . Anxiety   . 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 Endarterectomy Left 10/10/2012    Procedure: ENDARTERECTOMY CAROTID- LEFT NECK WITH GREATER SAPHENOUS VEIN PATCH LEFT LEG;  Surgeon: Elam Dutch, MD;  Location: [place];  Service: Vascular;  Laterality: Left;  . Carotid endarterectomy Left 10-10-12  . Abdominal hysterectomy  1996  . Tonsillectomy      age 54  . Nasal septum surgery      late 70's  . Colonoscopy    . Insertion of drain  10/02/12    right low abdomen and draining pus  . Picc line placed      10/15/12  . Tubal ligation  ~ 1972  . Esophagogastroduodenoscopy N/A 01/02/2013    Procedure: ESOPHAGOGASTRODUODENOSCOPY (EGD);  Surgeon: Inda Castle, MD;  Location: [place];  Service: Endoscopy;  Laterality: N/A;  . Cholecystectomy N/A 11/29/2012    Procedure: ATTEMPTED LAPAROSCOPIC CHOLECYSTECTOMY ;  Surgeon: Gwenyth Ober, MD;  Location: [place];  Service: General;  Laterality: N/A;  . Cholecystectomy N/A 11/29/2012    Procedure: CHOLECYSTECTOMY, COMMON BILE DUCT EXPLORATION, T-TUBE PLACEMENT;  Surgeon: Gwenyth Ober, MD;  Location: [place];  Service: General;  Laterality: N/A;  . Colonoscopy N/A 05/14/2013    Procedure: COLONOSCOPY;  Surgeon: Inda Castle, MD;  Location: [facility];  Service: Endoscopy;  Laterality: N/A;     Current Outpatient Prescriptions  Medication Sig Dispense Refill  . ALPRAZolam (XANAX) 0.5 MG tablet Take 0.5 mg by mouth every 6 (six) hours as needed for anxiety (anxiety).    Marland Kitchen aspirin 325 MG tablet Take 325 mg by mouth daily after breakfast. "to thin  Blood"    . bisoprolol (ZEBETA) 10 MG tablet TAKE 1 TABLET BY MOUTH EVERY DAY 60 tablet 1  . Cascara [place] 450 MG CAPS Take 900 mg by mouth at bedtime.    . cephALEXin  (KEFLEX) 500 MG capsule Take 500 mg by mouth as needed (deep chest congestion).   2  . ciprofloxacin (CIPRO) 500 MG tablet Take 1 tablet (500 mg total) by mouth 2 (two) times daily. 20 tablet 2  . clopidogrel (PLAVIX) 75 MG tablet TAKE 1 TABLET (75 MG TOTAL) BY MOUTH DAILY. 30 tablet 3  . cyanocobalamin (,VITAMIN B-12,) 1000 MCG/ML injection Inject 1,000 mcg into the muscle as needed (for energy).    . cyclobenzaprine (FLEXERIL) 10 MG tablet Take 10 mg by mouth 3 (three) times daily as needed for muscle spasms (FOR LOWER BACK MUSCLE SPASMS).    Marland Kitchen diphenhydrAMINE (BENADRYL) 25 MG tablet Take 50 mg by mouth every  8 (eight) hours as needed. Patient may take 50 mg three times a day as needed for allergies    . docusate sodium (COLACE) 100 MG capsule Take 100 mg by mouth 3 (three) times daily with meals.     . famotidine (PEPCID) 20 MG tablet TAKE 1 TABLET BY MOUTH TWICE A DAY BEFORE A MEAL *4/29* 60 tablet 6  . fexofenadine (ALLEGRA) 180 MG tablet Take 180 mg by mouth daily after breakfast.     . furosemide (LASIX) 40 MG tablet Take 1 tablet (40 mg total) by mouth 2 (two) times daily. 60 tablet 11  . Garlic Oil 6503 MG CAPS Take 1,000 mg by mouth daily after breakfast.    . guaiFENesin (MUCINEX) 600 MG 12 hr tablet Take 600 mg by mouth 2 (two) times daily as needed for cough.    . hydrochlorothiazide (MICROZIDE) 12.5 MG capsule TAKE ONE CAPSULE BY MOUTH EVERY DAY AS NEEDED IF SYSTOLIC IS GREATER THAN 546 30 capsule 6  . Ipratropium-Albuterol (COMBIVENT RESPIMAT) 20-100 MCG/ACT AERS respimat Inhale 2 puffs into the lungs 4 (four) times daily.    Marland Kitchen levETIRAcetam (KEPPRA) 500 MG tablet Take by mouth 2 (two) times daily. Take 1/2 tablet by mouth in the morning and 1 tablet by mouth in the evening    . lidocaine-prilocaine (EMLA) cream Apply 1 application topically as needed (to numb the phlebotomy site). 30 g 6  . lisinopril (PRINIVIL,ZESTRIL) 10 MG tablet Take 10 mg by mouth 2 (two) times daily as needed.  If Systolic is GREATER than 568    . magnesium oxide (MAG-OX) 400 MG tablet Take 1,200 mg by mouth daily.    . Methylsulfonylmethane (MSM) 1000 MG CAPS Take 3 capsules by mouth daily. With breakfast    . mineral oil liquid Take 60 mLs by mouth at bedtime. With Juice    . Misc Natural Products (TART CHERRY ADVANCED PO) Take 1,200 mg by mouth 2 (two) times daily.    . nitroGLYCERIN (NITROSTAT) 0.4 MG SL tablet Place 0.4 mg under the tongue every 5 (five) minutes as needed for chest pain.    Marland Kitchen ondansetron (ZOFRAN) 8 MG tablet Take by mouth every 8 (eight) hours as needed for nausea or vomiting.    . ondansetron (ZOFRAN-ODT) 4 MG disintegrating tablet Take 4 mg by mouth every 8 (eight) hours as needed for nausea or vomiting (nausea).    . OxyCODONE (OXYCONTIN) 10 mg T12A 12 hr tablet Take 10 mg by mouth 2 (two) times daily. Take one tablet by mouth at 8am and one tablet by mouth at 4pm    . OxyCODONE (OXYCONTIN) 15 mg T12A 12 hr tablet Take 15 mg by mouth 3 (three) times daily as needed (pain).    . OxyCODONE (OXYCONTIN) 20 mg T12A 12 hr tablet Take 20 mg by mouth 3 (three) times daily. Take 1 tablet by mouth at 12noon, 8pm and 4am    . pantoprazole (PROTONIX) 40 MG tablet Take 1 tablet (40 mg total) by mouth daily. 30 tablet 0  . potassium chloride (MICRO-K) 10 MEQ CR capsule Take 10 mEq by mouth. Take 1 capsule by mouth 8 times a day    . promethazine (PHENERGAN) 25 MG tablet Take 25 mg by mouth every 6 (six) hours as needed for nausea.    . Sennosides (SENNA LAX PO) Take 1 tablet by mouth 3 (three) times daily after meals.     . temazepam (RESTORIL) 30 MG capsule TAKE ONE CAPSULE BY MOUTH AT  BEDTIME 30 capsule 5  . thyroid (ARMOUR) 60 MG tablet Take 2.5 tablets (150 mg total) by mouth daily after breakfast. Levothyroxine is not effective. Takes only armour thyroid. 75 tablet 11  . triamcinolone cream (KENALOG) 0.1 % Apply 1 application topically 2 (two) times daily as needed (to skin).    . Valerian  100 MG CAPS Take 100 mg by mouth at bedtime as needed. Take for sleep    . Vitamin D, Cholecalciferol, 1000 UNITS TABS Take 4,000 Units by mouth every morning.     No current facility-administered medications for this visit.    Allergies:   Ethrane; Codeine; Synthroid; Calcium channel blockers; and Penicillins    Social History:  The patient  reports that she has never smoked. She has never used smokeless tobacco. She reports that she does not drink alcohol or use illicit drugs.   Family History:  The patient's family history includes Coronary artery disease in her mother; Heart attack in her father and mother; Heart disease in her father and mother; Hyperlipidemia in her sister; Hypertension in her sister.    ROS:  Please see the history of present illness.   Otherwise, review of systems are positive for none.   All other systems are reviewed and negative.    PHYSICAL EXAM: VS:  BP 122/62 mmHg  Pulse 74  Ht 5' 2.5" (1.588 m)  Wt 162 lb (73.483 kg)  BMI 29.14 kg/m2 , BMI Body mass index is 29.14 kg/(m^2). GEN: Well nourished, well developed, in no acute distress HEENT: normal Neck: no JVD, carotid bruits, or masses Cardiac: RRR; there is a grade 1/6 systolic ejection murmur at the base.  No diastolic murmur.  No gallop or rub. Respiratory:  clear to auscultation bilaterally, normal work of breathing GI: soft, nontender, nondistended, + BS MS: no deformity or atrophy Skin: warm and dry, no rash Neuro:  Strength and sensation are intact Psych: euthymic mood, full affect   EKG:  EKG is ordered today. The ekg ordered today demonstrates normal sinus rhythm.  Within normal limits.   Recent Labs: 12/18/2014: ALT <9; BUN 13.4; Creatinine 0.9; HGB 6.1*; Platelets 220; Potassium 3.1*; Sodium 139    Lipid Panel    Component Value Date/Time   CHOL 58 10/07/2012 0700   TRIG 121 10/07/2012 0700   HDL 11* 10/07/2012 0700   CHOLHDL 5.3 10/07/2012 0700   VLDL 24 10/07/2012 0700    LDLCALC 23 10/07/2012 0700      Wt Readings from Last 3 Encounters:  12/30/14 162 lb (73.483 kg)  11/28/14 159 lb 9.6 oz (72.394 kg)  11/14/14 154 lb (69.854 kg)        ASSESSMENT AND PLAN:  1. essential hypertension 2. polycythemia rubra vera. She had a phlebotomy about 2 weeks ago. She has a phlebotomy every 6 weeks if her hemoglobin goes above 6.0 3. status post cholecystectomy 4. old stroke with history of left carotid stenosis 5. seizure disorder, on Keppra, followed by Dr. Leonie Man 6. history of recurrent sinusitis 7. GERD 8. Past history of frequent PVCs 9. Hypothyroid. Requires brand name Armour thyroid 150 gr daily.  Clinically euthyroid.  We are checking TSH and free T4 today 10. URI.  She will finish out the course of Cipro already begun.  She may try Mucinex on an as-needed basis. 11.  History of fibromyalgia 12.  Memory disorder  Disposition: Continue current medication.  We will plan to see her back in about 4 months.  We told her today  that she would need to get a primary care physician.  She has someone in mind and she will follow up with that.  Current medicines are reviewed at length with the patient today.  The patient does not have concerns regarding medicines.  The following changes have been made:  no change  Labs/ tests ordered today include:   Orders Placed This Encounter  Procedures  . TSH  . T4, free  . EKG 12-Lead      Signed, Darlin Coco MD 12/30/2014 12:48 PM    [facility] [place], [place], [place]  [address] Phone: [phone]; Fax: [phone]

## 2014-12-30 NOTE — Patient Instructions (Signed)
Medication Instructions:  START MUCINEX PLAIN TWICE A DAY AS NEEDED FOR COUGH   Labwork: THS/FT4  Testing/Procedures: NONE  Follow-Up: Your physician wants you to follow-up in: [place] will receive a reminder letter in the mail two months in advance. If you don't receive a letter, please call our office to schedule the follow-up appointment.

## 2014-12-31 ENCOUNTER — Telehealth: Payer: Self-pay | Admitting: *Deleted

## 2014-12-31 ENCOUNTER — Encounter: Payer: Self-pay | Admitting: Hematology & Oncology

## 2014-12-31 ENCOUNTER — Ambulatory Visit (HOSPITAL_BASED_OUTPATIENT_CLINIC_OR_DEPARTMENT_OTHER): Payer: Medicare Other | Admitting: Hematology & Oncology

## 2014-12-31 ENCOUNTER — Ambulatory Visit: Payer: Medicare Other

## 2014-12-31 ENCOUNTER — Other Ambulatory Visit: Payer: Self-pay | Admitting: *Deleted

## 2014-12-31 ENCOUNTER — Other Ambulatory Visit (HOSPITAL_BASED_OUTPATIENT_CLINIC_OR_DEPARTMENT_OTHER): Payer: Medicare Other

## 2014-12-31 VITALS — BP 123/45 | HR 80 | Temp 98.0°F | Resp 16 | Ht 62.5 in | Wt 160.0 lb

## 2014-12-31 DIAGNOSIS — Z8673 Personal history of transient ischemic attack (TIA), and cerebral infarction without residual deficits: Secondary | ICD-10-CM

## 2014-12-31 DIAGNOSIS — F329 Major depressive disorder, single episode, unspecified: Secondary | ICD-10-CM | POA: Diagnosis not present

## 2014-12-31 DIAGNOSIS — D45 Polycythemia vera: Secondary | ICD-10-CM

## 2014-12-31 DIAGNOSIS — Z780 Asymptomatic menopausal state: Secondary | ICD-10-CM

## 2014-12-31 DIAGNOSIS — I6302 Cerebral infarction due to thrombosis of basilar artery: Secondary | ICD-10-CM

## 2014-12-31 DIAGNOSIS — E032 Hypothyroidism due to medicaments and other exogenous substances: Secondary | ICD-10-CM | POA: Diagnosis not present

## 2014-12-31 DIAGNOSIS — M797 Fibromyalgia: Secondary | ICD-10-CM

## 2014-12-31 LAB — CBC WITH DIFFERENTIAL (CANCER CENTER ONLY)
BASO#: 0 10*3/uL (ref 0.0–0.2)
BASO%: 0.2 % (ref 0.0–2.0)
EOS%: 8.6 % — AB (ref 0.0–7.0)
Eosinophils Absolute: 0.4 10*3/uL (ref 0.0–0.5)
HCT: 20.3 % — ABNORMAL LOW (ref 34.8–46.6)
HEMOGLOBIN: 4.8 g/dL — AB (ref 11.6–15.9)
LYMPH#: 0.6 10*3/uL — ABNORMAL LOW (ref 0.9–3.3)
LYMPH%: 13.4 % — AB (ref 14.0–48.0)
MCH: 16.3 pg — ABNORMAL LOW (ref 26.0–34.0)
MCHC: 23.6 g/dL — AB (ref 32.0–36.0)
MCV: 69 fL — ABNORMAL LOW (ref 81–101)
MONO#: 0.6 10*3/uL (ref 0.1–0.9)
MONO%: 14.1 % — AB (ref 0.0–13.0)
NEUT%: 63.7 % (ref 39.6–80.0)
NEUTROS ABS: 2.7 10*3/uL (ref 1.5–6.5)
PLATELETS: 190 10*3/uL (ref 145–400)
RBC: 2.94 10*6/uL — AB (ref 3.70–5.32)
RDW: 20.5 % — ABNORMAL HIGH (ref 11.1–15.7)
WBC: 4.2 10*3/uL (ref 3.9–10.0)

## 2014-12-31 LAB — COMPREHENSIVE METABOLIC PANEL (CC13)
ALK PHOS: 100 U/L (ref 40–150)
ALT: <9 U/L (ref 0–55)
AST: 13 U/L (ref 5–34)
Albumin: 3.6 g/dL (ref 3.5–5.0)
Anion Gap: 8 mEq/L (ref 3–11)
BUN: 9.1 mg/dL (ref 7.0–26.0)
CO2: 33 meq/L — AB (ref 22–29)
Calcium: 9.1 mg/dL (ref 8.4–10.4)
Chloride: 101 mEq/L (ref 98–109)
Creatinine: 0.8 mg/dL (ref 0.6–1.1)
EGFR: 74 mL/min/{1.73_m2} — ABNORMAL LOW (ref >90)
GLUCOSE: 78 mg/dL (ref 70–140)
Potassium: 3.2 mEq/L — ABNORMAL LOW (ref 3.5–5.1)
SODIUM: 141 meq/L (ref 136–145)
Total Bilirubin: 0.89 mg/dL (ref 0.20–1.20)
Total Protein: 6.6 g/dL (ref 6.4–8.3)

## 2014-12-31 MED ORDER — OXYCODONE HCL ER 10 MG PO T12A: 10.0000 mg | EXTENDED_RELEASE_TABLET | Freq: Two times a day (BID) | ORAL | Status: DC

## 2014-12-31 MED ORDER — OXYCODONE HCL ER 15 MG PO T12A: 15.0000 mg | EXTENDED_RELEASE_TABLET | Freq: Three times a day (TID) | ORAL | Status: DC | PRN

## 2014-12-31 MED ORDER — OXYCODONE HCL ER 20 MG PO T12A: 20.0000 mg | EXTENDED_RELEASE_TABLET | Freq: Three times a day (TID) | ORAL | Status: DC

## 2014-12-31 NOTE — Telephone Encounter (Signed)
Critical Value HGB 4.8 Dr Marin Olp notified. No orders at this time

## 2014-12-31 NOTE — Progress Notes (Signed)
Quick Note:  Please report to patient. The recent labs are stable. Continue same medication and careful diet. Thyroid okay ______ 

## 2014-12-31 NOTE — Progress Notes (Signed)
Hematology and Oncology Follow Up Visit  [name] [id number] 05/16/1945 69 y.o. 12/31/2014   Principle Diagnosis:  Polycythemia vera -- hyperviscosity variant. 2. 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  Current Therapy:    Phlebotomy to maintain hemoglobin below 6.0     Interim History:  [name] is back for followup. It is hard to say if she is having any TIAs. She says that she is having some occasions where she cannot pick up her food utensils. This lasts only a few seconds.  She apparently had Dopplers of her carotids done recently. These apparently looked okay.  I think she probably will need to have an MRI of the brain. I think this would be helpful.  She's had no problems with passing out. She's had no chest wall pain. She's had occasional abdominal discomfort. She's had some diarrhea. Overall, constipation seems be more of an issue because of the narcotic medications.  Her fibromyalgia seems to be under fairly good control. She does have some occasional knee pain.  She's had no obvious bleeding. She's had no obvious fever. .   Overall, her performance status is ECOG 1. Medications:Allergies:  Allergies  Allergen Reactions  . Ethrane [Enflurane] Nausea And Vomiting  . Codeine Nausea Only  . Synthroid [Levothyroxine Sodium] Other (See Comments)    Not effective. Causes excessive sleepiness. Takes armour thyroid  . Calcium Channel Blockers Swelling, Palpitations and Hypertension  . Penicillins Rash    Past Medical History, Surgical history, Social history, and Family History were reviewed and updated.  Review of Systems: As above  Physical Exam:  height is 5' 2.5" (1.588 m) and weight is 160 lb (72.576 kg). Her oral temperature is 98 F (36.7 C). Her blood pressure is 123/45 and her pulse is 80. Her respiration is 16.   Well-developed and well-nourished white female in no obvious distress. Head and neck exam shows pale  conjunctiva. There is no oral lesions. There is no adenopathy in the neck. Lungs are clear bilaterally. Cardiac exam regular rate and rhythm with a 1/6 systolic ejection murmur. Abdomen is soft. Has good bowel sounds. There is no palpable liver or spleen tip appreciative well-healed laparotomy scars. Back exam no tenderness over the spine ribs or hips. Extremities shows no clubbing cyanosis or edema. Neurological exam shows no focal neurological deficits. Skin exam no rashes, ecchymoses or petechia.  Lab Results  Component Value Date   WBC 4.2 12/31/2014   HGB 4.8* 12/31/2014   HCT 20.3* 12/31/2014   MCV 69* 12/31/2014   PLT 190 12/31/2014     Chemistry      Component Value Date/Time   NA 139 12/18/2014 1310   NA 136 12/04/2014 1502   NA 143 06/05/2014 1312   K 3.1* 12/18/2014 1310   K 3.1* 12/04/2014 1502   K 3.3 06/05/2014 1312   CL 95* 12/04/2014 1502   CL 102 06/05/2014 1312   CO2 30* 12/18/2014 1310   CO2 30 12/04/2014 1502   CO2 32 06/05/2014 1312   BUN 13.4 12/18/2014 1310   BUN 10 12/04/2014 1502   BUN 6* 06/05/2014 1312   CREATININE 0.9 12/18/2014 1310   CREATININE 0.84 12/04/2014 1502   CREATININE 0.8 06/05/2014 1312      Component Value Date/Time   CALCIUM 9.2 12/18/2014 1310   CALCIUM 9.0 12/04/2014 1502   CALCIUM 8.9 06/05/2014 1312   ALKPHOS 116 12/18/2014 1310   ALKPHOS 97 12/04/2014 1502  ALKPHOS 100* 06/05/2014 1312   AST 15 12/18/2014 1310   AST 14 12/04/2014 1502   AST 22 06/05/2014 1312   ALT <9 12/18/2014 1310   ALT 8 12/04/2014 1502   ALT 9* 06/05/2014 1312   BILITOT 0.76 12/18/2014 1310   BILITOT 0.6 12/04/2014 1502   BILITOT 0.60 06/05/2014 1312         Impression and Plan: [name] is 69 year old female with polycythemia vera. She is hyperviscous. She does very well with her hemoglobin below 6. Her hemoglobin is only 4.8. As such, we will not have to phlebotomize her today.   Overall, I don't see any new issues with her. She does have  an element of depression. She does not want to take anything for this. She will take some herbal supplements. I don't think this would be detrimental to her.  When Dr. Wendall Papa, she clearly will need a primary care doctor.  We will continue have her come in every 2 weeks for lab work.  I will see her back in 2 more months.  Sarah Napoleon, MD 10/4/20161:42 PM

## 2014-12-31 NOTE — Progress Notes (Signed)
No Phlebotomy today per Dr Ennever 

## 2015-01-01 LAB — FERRITIN CHCC: FERRITIN: 6 ng/mL — AB (ref 9–269)

## 2015-01-01 LAB — TSH CHCC: TSH: <0.08 m(IU)/L — ABNORMAL LOW (ref 0.308–3.960)

## 2015-01-01 LAB — IRON AND TIBC CHCC
%SAT: 6 % — AB (ref 21–57)
Iron: 29 ug/dL — ABNORMAL LOW (ref 41–142)
TIBC: 504 ug/dL — AB (ref 236–444)
UIBC: 475 ug/dL — ABNORMAL HIGH (ref 120–384)

## 2015-01-07 DIAGNOSIS — H5211 Myopia, right eye: Secondary | ICD-10-CM | POA: Diagnosis not present

## 2015-01-07 DIAGNOSIS — H25811 Combined forms of age-related cataract, right eye: Secondary | ICD-10-CM | POA: Diagnosis not present

## 2015-01-07 DIAGNOSIS — H52221 Regular astigmatism, right eye: Secondary | ICD-10-CM | POA: Diagnosis not present

## 2015-01-09 ENCOUNTER — Other Ambulatory Visit: Payer: Self-pay | Admitting: Cardiology

## 2015-01-09 DIAGNOSIS — G4700 Insomnia, unspecified: Secondary | ICD-10-CM

## 2015-01-09 NOTE — Telephone Encounter (Signed)
Pt requesting a refill on this medication. Because it is a controlled medication, wanted to know if you wanted to refill this medication? Please advise

## 2015-01-09 NOTE — Telephone Encounter (Signed)
OK to refill temazepam

## 2015-01-10 ENCOUNTER — Other Ambulatory Visit: Payer: Self-pay | Admitting: Cardiology

## 2015-01-11 ENCOUNTER — Ambulatory Visit (HOSPITAL_BASED_OUTPATIENT_CLINIC_OR_DEPARTMENT_OTHER)
Admission: RE | Admit: 2015-01-11 | Discharge: 2015-01-11 | Disposition: A | Discharge status: Home / Self Care | Payer: Medicare Other | Source: Ambulatory Visit | Attending: Hematology & Oncology | Admitting: Hematology & Oncology

## 2015-01-11 DIAGNOSIS — Z8673 Personal history of transient ischemic attack (TIA), and cerebral infarction without residual deficits: Secondary | ICD-10-CM | POA: Diagnosis not present

## 2015-01-11 DIAGNOSIS — R51 Headache: Secondary | ICD-10-CM | POA: Diagnosis not present

## 2015-01-11 DIAGNOSIS — D45 Polycythemia vera: Secondary | ICD-10-CM

## 2015-01-11 DIAGNOSIS — I6302 Cerebral infarction due to thrombosis of basilar artery: Secondary | ICD-10-CM

## 2015-01-11 DIAGNOSIS — G459 Transient cerebral ischemic attack, unspecified: Secondary | ICD-10-CM | POA: Diagnosis not present

## 2015-01-14 ENCOUNTER — Telehealth: Payer: Self-pay | Admitting: Nurse Practitioner

## 2015-01-14 NOTE — Telephone Encounter (Addendum)
Pt verbalized understanding and appreciation.   ----- Message from Volanda Napoleon, MD sent at 01/14/2015  6:48 AM EDT ----- Call - brain MRI look pretty stable.  Nothing looks new.  There might be an area in the left front part of the brain that could represent some low blood flow, but this will be difficult to prove.  Keep up with your current medications!!  [name]

## 2015-01-15 ENCOUNTER — Other Ambulatory Visit (HOSPITAL_BASED_OUTPATIENT_CLINIC_OR_DEPARTMENT_OTHER): Payer: Medicare Other

## 2015-01-15 ENCOUNTER — Telehealth: Payer: Self-pay | Admitting: *Deleted

## 2015-01-15 DIAGNOSIS — D45 Polycythemia vera: Secondary | ICD-10-CM | POA: Diagnosis present

## 2015-01-15 LAB — CBC WITH DIFFERENTIAL (CANCER CENTER ONLY)
BASO#: 0 10*3/uL (ref 0.0–0.2)
BASO%: 0.2 % (ref 0.0–2.0)
EOS ABS: 0.3 10*3/uL (ref 0.0–0.5)
EOS%: 7.1 % — ABNORMAL HIGH (ref 0.0–7.0)
HCT: 19.3 % — ABNORMAL LOW (ref 34.8–46.6)
HEMOGLOBIN: 4.7 g/dL — AB (ref 11.6–15.9)
LYMPH#: 0.6 10*3/uL — ABNORMAL LOW (ref 0.9–3.3)
LYMPH%: 14.8 % (ref 14.0–48.0)
MCH: 17 pg — ABNORMAL LOW (ref 26.0–34.0)
MCHC: 24.4 g/dL — ABNORMAL LOW (ref 32.0–36.0)
MCV: 70 fL — ABNORMAL LOW (ref 81–101)
MONO#: 0.5 10*3/uL (ref 0.1–0.9)
MONO%: 12 % (ref 0.0–13.0)
NEUT%: 65.9 % (ref 39.6–80.0)
NEUTROS ABS: 2.8 10*3/uL (ref 1.5–6.5)
PLATELETS: 159 10*3/uL (ref 145–400)
RBC: 2.76 10*6/uL — AB (ref 3.70–5.32)
RDW: 21.3 % — ABNORMAL HIGH (ref 11.1–15.7)
WBC: 4.3 10*3/uL (ref 3.9–10.0)

## 2015-01-15 LAB — COMPREHENSIVE METABOLIC PANEL (CC13)
ALBUMIN: 3.5 g/dL (ref 3.5–5.0)
ALK PHOS: 106 U/L (ref 40–150)
ALT: <9 U/L (ref 0–55)
AST: 17 U/L (ref 5–34)
Anion Gap: 8 mEq/L (ref 3–11)
BILIRUBIN TOTAL: 0.6 mg/dL (ref 0.20–1.20)
BUN: 10.1 mg/dL (ref 7.0–26.0)
CO2: 30 meq/L — AB (ref 22–29)
Calcium: 9 mg/dL (ref 8.4–10.4)
Chloride: 102 mEq/L (ref 98–109)
Creatinine: 0.8 mg/dL (ref 0.6–1.1)
EGFR: 70 mL/min/{1.73_m2} — ABNORMAL LOW (ref >90)
GLUCOSE: 119 mg/dL (ref 70–140)
Potassium: 3.4 mEq/L — ABNORMAL LOW (ref 3.5–5.1)
SODIUM: 140 meq/L (ref 136–145)
TOTAL PROTEIN: 6.6 g/dL (ref 6.4–8.3)

## 2015-01-15 NOTE — Telephone Encounter (Signed)
Critical Value HGB 4.7 Dr Marin Olp aware. No orders received.

## 2015-01-20 ENCOUNTER — Other Ambulatory Visit: Payer: Self-pay | Admitting: Cardiology

## 2015-01-21 ENCOUNTER — Other Ambulatory Visit: Payer: Self-pay | Admitting: Cardiology

## 2015-01-21 DIAGNOSIS — F419 Anxiety disorder, unspecified: Secondary | ICD-10-CM

## 2015-01-21 DIAGNOSIS — M791 Myalgia, unspecified site: Secondary | ICD-10-CM

## 2015-01-22 ENCOUNTER — Other Ambulatory Visit: Payer: Self-pay | Admitting: Cardiology

## 2015-01-22 DIAGNOSIS — F419 Anxiety disorder, unspecified: Secondary | ICD-10-CM

## 2015-01-23 NOTE — Telephone Encounter (Signed)
Okay to refill xanax. 

## 2015-01-27 ENCOUNTER — Other Ambulatory Visit: Payer: Self-pay | Admitting: Nurse Practitioner

## 2015-01-27 DIAGNOSIS — M797 Fibromyalgia: Secondary | ICD-10-CM

## 2015-01-27 MED ORDER — OXYCODONE HCL ER 10 MG PO T12A: 10.0000 mg | EXTENDED_RELEASE_TABLET | Freq: Two times a day (BID) | ORAL | Status: DC

## 2015-01-27 MED ORDER — OXYCODONE HCL ER 20 MG PO T12A: 20.0000 mg | EXTENDED_RELEASE_TABLET | Freq: Three times a day (TID) | ORAL | Status: DC

## 2015-01-27 MED ORDER — OXYCODONE HCL ER 15 MG PO T12A: 15.0000 mg | EXTENDED_RELEASE_TABLET | Freq: Three times a day (TID) | ORAL | Status: DC | PRN

## 2015-01-27 NOTE — Telephone Encounter (Signed)
rx called to pharmacy 

## 2015-01-27 NOTE — Telephone Encounter (Signed)
Okay to refill xanax. 

## 2015-01-29 ENCOUNTER — Telehealth: Payer: Self-pay | Admitting: *Deleted

## 2015-01-29 ENCOUNTER — Other Ambulatory Visit (HOSPITAL_BASED_OUTPATIENT_CLINIC_OR_DEPARTMENT_OTHER): Payer: Medicare Other

## 2015-01-29 DIAGNOSIS — D45 Polycythemia vera: Secondary | ICD-10-CM

## 2015-01-29 LAB — CBC WITH DIFFERENTIAL (CANCER CENTER ONLY)
BASO#: 0 10*3/uL (ref 0.0–0.2)
BASO%: 0.3 % (ref 0.0–2.0)
EOS ABS: 0.5 10*3/uL (ref 0.0–0.5)
EOS%: 7.4 % — ABNORMAL HIGH (ref 0.0–7.0)
HCT: 23 % — ABNORMAL LOW (ref 34.8–46.6)
HGB: 5.6 g/dL — CL (ref 11.6–15.9)
LYMPH#: 1 10*3/uL (ref 0.9–3.3)
LYMPH%: 14.2 % (ref 14.0–48.0)
MCH: 16.6 pg — AB (ref 26.0–34.0)
MCHC: 24.3 g/dL — ABNORMAL LOW (ref 32.0–36.0)
MCV: 68 fL — AB (ref 81–101)
MONO#: 1 10*3/uL — ABNORMAL HIGH (ref 0.1–0.9)
MONO%: 13.9 % — AB (ref 0.0–13.0)
NEUT#: 4.6 10*3/uL (ref 1.5–6.5)
NEUT%: 64.2 % (ref 39.6–80.0)
Platelets: 198 10*3/uL (ref 145–400)
RBC: 3.38 10*6/uL — ABNORMAL LOW (ref 3.70–5.32)
RDW: 20.8 % — ABNORMAL HIGH (ref 11.1–15.7)
WBC: 7.2 10*3/uL (ref 3.9–10.0)

## 2015-01-29 LAB — COMPREHENSIVE METABOLIC PANEL (CC13)
ALBUMIN: 3.8 g/dL (ref 3.5–5.0)
ALK PHOS: 124 U/L (ref 40–150)
AST: 15 U/L (ref 5–34)
Anion Gap: 8 mEq/L (ref 3–11)
BILIRUBIN TOTAL: 1.15 mg/dL (ref 0.20–1.20)
BUN: 11.5 mg/dL (ref 7.0–26.0)
CO2: 32 mEq/L — ABNORMAL HIGH (ref 22–29)
Calcium: 9.6 mg/dL (ref 8.4–10.4)
Chloride: 98 mEq/L (ref 98–109)
Creatinine: 0.9 mg/dL (ref 0.6–1.1)
EGFR: 67 mL/min/{1.73_m2} — AB (ref >90)
GLUCOSE: 99 mg/dL (ref 70–140)
Potassium: 3.3 mEq/L — ABNORMAL LOW (ref 3.5–5.1)
SODIUM: 137 meq/L (ref 136–145)
TOTAL PROTEIN: 7.1 g/dL (ref 6.4–8.3)

## 2015-01-29 NOTE — Telephone Encounter (Signed)
Critical Value HGB 5.6 Dr Marin Olp notified. No orders received

## 2015-02-12 ENCOUNTER — Other Ambulatory Visit (HOSPITAL_BASED_OUTPATIENT_CLINIC_OR_DEPARTMENT_OTHER): Payer: Medicare Other

## 2015-02-12 ENCOUNTER — Telehealth: Payer: Self-pay | Admitting: *Deleted

## 2015-02-12 DIAGNOSIS — D45 Polycythemia vera: Secondary | ICD-10-CM | POA: Diagnosis present

## 2015-02-12 LAB — CBC WITH DIFFERENTIAL (CANCER CENTER ONLY)
BASO#: 0 10*3/uL (ref 0.0–0.2)
BASO%: 0.2 % (ref 0.0–2.0)
EOS%: 4.5 % (ref 0.0–7.0)
Eosinophils Absolute: 0.2 10*3/uL (ref 0.0–0.5)
HCT: 21.8 % — ABNORMAL LOW (ref 34.8–46.6)
HEMOGLOBIN: 5.2 g/dL — AB (ref 11.6–15.9)
LYMPH#: 0.7 10*3/uL — ABNORMAL LOW (ref 0.9–3.3)
LYMPH%: 18.1 % (ref 14.0–48.0)
MCH: 16.4 pg — ABNORMAL LOW (ref 26.0–34.0)
MCHC: 23.9 g/dL — ABNORMAL LOW (ref 32.0–36.0)
MCV: 69 fL — ABNORMAL LOW (ref 81–101)
MONO#: 0.5 10*3/uL (ref 0.1–0.9)
MONO%: 12.4 % (ref 0.0–13.0)
NEUT%: 64.8 % (ref 39.6–80.0)
NEUTROS ABS: 2.6 10*3/uL (ref 1.5–6.5)
Platelets: 185 10*3/uL (ref 145–400)
RBC: 3.17 10*6/uL — AB (ref 3.70–5.32)
RDW: 20.8 % — ABNORMAL HIGH (ref 11.1–15.7)
WBC: 4 10*3/uL (ref 3.9–10.0)

## 2015-02-12 LAB — COMPREHENSIVE METABOLIC PANEL (CC13)
ALBUMIN: 3.6 g/dL (ref 3.5–5.0)
ALK PHOS: 107 U/L (ref 40–150)
ALT: <9 U/L (ref 0–55)
AST: 15 U/L (ref 5–34)
Anion Gap: 7 mEq/L (ref 3–11)
BILIRUBIN TOTAL: 0.56 mg/dL (ref 0.20–1.20)
BUN: 14.4 mg/dL (ref 7.0–26.0)
CO2: 30 mEq/L — ABNORMAL HIGH (ref 22–29)
Calcium: 9.1 mg/dL (ref 8.4–10.4)
Chloride: 103 mEq/L (ref 98–109)
Creatinine: 0.8 mg/dL (ref 0.6–1.1)
EGFR: 70 mL/min/{1.73_m2} — ABNORMAL LOW (ref >90)
GLUCOSE: 94 mg/dL (ref 70–140)
POTASSIUM: 4 meq/L (ref 3.5–5.1)
SODIUM: 141 meq/L (ref 136–145)
TOTAL PROTEIN: 6.7 g/dL (ref 6.4–8.3)

## 2015-02-12 NOTE — Telephone Encounter (Signed)
Critical Value HGB 5.2 Dr Marin Olp notified. No orders received.

## 2015-02-26 ENCOUNTER — Encounter: Payer: Self-pay | Admitting: Hematology & Oncology

## 2015-02-26 ENCOUNTER — Telehealth: Payer: Self-pay | Admitting: *Deleted

## 2015-02-26 ENCOUNTER — Other Ambulatory Visit (HOSPITAL_BASED_OUTPATIENT_CLINIC_OR_DEPARTMENT_OTHER): Payer: Medicare Other

## 2015-02-26 ENCOUNTER — Ambulatory Visit (HOSPITAL_BASED_OUTPATIENT_CLINIC_OR_DEPARTMENT_OTHER): Payer: Medicare Other | Admitting: Hematology & Oncology

## 2015-02-26 VITALS — BP 119/87 | HR 76 | Temp 97.7°F | Resp 20 | Ht 62.0 in | Wt 162.0 lb

## 2015-02-26 DIAGNOSIS — D45 Polycythemia vera: Secondary | ICD-10-CM

## 2015-02-26 DIAGNOSIS — M797 Fibromyalgia: Secondary | ICD-10-CM

## 2015-02-26 DIAGNOSIS — I6302 Cerebral infarction due to thrombosis of basilar artery: Secondary | ICD-10-CM

## 2015-02-26 DIAGNOSIS — I358 Other nonrheumatic aortic valve disorders: Secondary | ICD-10-CM

## 2015-02-26 LAB — CBC WITH DIFFERENTIAL (CANCER CENTER ONLY)
BASO#: 0 10*3/uL (ref 0.0–0.2)
BASO%: 0 % (ref 0.0–2.0)
EOS%: 15.9 % — AB (ref 0.0–7.0)
Eosinophils Absolute: 0.9 10*3/uL — ABNORMAL HIGH (ref 0.0–0.5)
HEMATOCRIT: 23.6 % — AB (ref 34.8–46.6)
HEMOGLOBIN: 5.7 g/dL — AB (ref 11.6–15.9)
LYMPH#: 0.8 10*3/uL — AB (ref 0.9–3.3)
LYMPH%: 14.6 % (ref 14.0–48.0)
MCH: 16.8 pg — ABNORMAL LOW (ref 26.0–34.0)
MCHC: 24.2 g/dL — AB (ref 32.0–36.0)
MCV: 70 fL — ABNORMAL LOW (ref 81–101)
MONO#: 0.7 10*3/uL (ref 0.1–0.9)
MONO%: 11.7 % (ref 0.0–13.0)
NEUT%: 57.8 % (ref 39.6–80.0)
NEUTROS ABS: 3.2 10*3/uL (ref 1.5–6.5)
Platelets: 177 10*3/uL (ref 145–400)
RBC: 3.39 10*6/uL — AB (ref 3.70–5.32)
RDW: 21.7 % — ABNORMAL HIGH (ref 11.1–15.7)
WBC: 5.5 10*3/uL (ref 3.9–10.0)

## 2015-02-26 LAB — COMPREHENSIVE METABOLIC PANEL (CC13)
ALBUMIN: 3.5 g/dL (ref 3.5–5.0)
ALK PHOS: 98 U/L (ref 40–150)
AST: 14 U/L (ref 5–34)
Anion Gap: 6 mEq/L (ref 3–11)
BUN: 8.5 mg/dL (ref 7.0–26.0)
CALCIUM: 9 mg/dL (ref 8.4–10.4)
CO2: 31 mEq/L — ABNORMAL HIGH (ref 22–29)
CREATININE: 0.8 mg/dL (ref 0.6–1.1)
Chloride: 104 mEq/L (ref 98–109)
EGFR: 80 mL/min/{1.73_m2} — ABNORMAL LOW (ref >90)
GLUCOSE: 86 mg/dL (ref 70–140)
POTASSIUM: 3.9 meq/L (ref 3.5–5.1)
SODIUM: 141 meq/L (ref 136–145)
Total Bilirubin: 0.84 mg/dL (ref 0.20–1.20)
Total Protein: 6.7 g/dL (ref 6.4–8.3)

## 2015-02-26 MED ORDER — OXYCODONE HCL ER 20 MG PO T12A: 20.0000 mg | EXTENDED_RELEASE_TABLET | Freq: Three times a day (TID) | ORAL | Status: DC

## 2015-02-26 MED ORDER — OXYCODONE HCL ER 15 MG PO T12A: 15.0000 mg | EXTENDED_RELEASE_TABLET | Freq: Three times a day (TID) | ORAL | Status: DC | PRN

## 2015-02-26 MED ORDER — OXYCODONE HCL ER 10 MG PO T12A: 10.0000 mg | EXTENDED_RELEASE_TABLET | Freq: Two times a day (BID) | ORAL | Status: DC

## 2015-02-26 NOTE — Progress Notes (Signed)
Hematology and Oncology Follow Up Visit  [name] [id number] 02-Dec-1945 69 y.o. 02/26/2015   Principle Diagnosis:  Polycythemia vera -- hyperviscosity variant. 2. 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  Current Therapy:    Phlebotomy to maintain hemoglobin below 6.0     Interim History:  [name] is back for followup. She feels a little bit tired. She says that her "blood is worn-out". I think a lot of this is the fact that she has no iron.  She had a good Thanksgiving. She and her husband went to his sister's house.  She has had no problems with bowels or bladder. Her fibromyalgia is doing pretty well right now. She is on oxycodone for this. This helps control the thyroid algia quite nicely.  She has had no neurological problems. She has occasional memory lapses.  She's had no obvious bleeding.  She's had no fever.  She's had no dysuria.  There's not been any issues with leg swelling. She's had no rashes.    Overall, her performance status is ECOG 1. Medications:Allergies:  Allergies  Allergen Reactions  . Ethrane [Enflurane] Nausea And Vomiting  . Codeine Nausea Only  . Synthroid [Levothyroxine Sodium] Other (See Comments)    Not effective. Causes excessive sleepiness. Takes armour thyroid  . Calcium Channel Blockers Swelling, Palpitations and Hypertension  . Penicillins Rash    Past Medical History, Surgical history, Social history, and Family History were reviewed and updated.  Review of Systems: As above  Physical Exam:  height is 5\' 2"  (1.575 m) and weight is 162 lb (73.483 kg). Her oral temperature is 97.7 F (36.5 C). Her blood pressure is 119/87 and her pulse is 76. Her respiration is 20.   Well-developed and well-nourished white female in no obvious distress. Head and neck exam shows pale conjunctiva. There is no oral lesions. There is no adenopathy in the neck. Lungs are clear bilaterally. Cardiac exam  regular rate and rhythm with a 1/6 systolic ejection murmur. Abdomen is soft. Has good bowel sounds. There is no palpable liver or spleen tip appreciative well-healed laparotomy scars. Back exam no tenderness over the spine ribs or hips. Extremities shows no clubbing cyanosis or edema. Neurological exam shows no focal neurological deficits. Skin exam no rashes, ecchymoses or petechia.  Lab Results  Component Value Date   WBC 5.5 02/26/2015   HGB 5.7* 02/26/2015   HCT 23.6* 02/26/2015   MCV 70* 02/26/2015   PLT 177 02/26/2015     Chemistry      Component Value Date/Time   NA 141 02/12/2015 1309   NA 136 12/04/2014 1502   NA 143 06/05/2014 1312   K 4.0 02/12/2015 1309   K 3.1* 12/04/2014 1502   K 3.3 06/05/2014 1312   CL 95* 12/04/2014 1502   CL 102 06/05/2014 1312   CO2 30* 02/12/2015 1309   CO2 30 12/04/2014 1502   CO2 32 06/05/2014 1312   BUN 14.4 02/12/2015 1309   BUN 10 12/04/2014 1502   BUN 6* 06/05/2014 1312   CREATININE 0.8 02/12/2015 1309   CREATININE 0.84 12/04/2014 1502   CREATININE 0.8 06/05/2014 1312      Component Value Date/Time   CALCIUM 9.1 02/12/2015 1309   CALCIUM 9.0 12/04/2014 1502   CALCIUM 8.9 06/05/2014 1312   ALKPHOS 107 02/12/2015 1309   ALKPHOS 97 12/04/2014 1502   ALKPHOS 100* 06/05/2014 1312   AST 15 02/12/2015 1309   AST 14  12/04/2014 1502   AST 22 06/05/2014 1312   ALT <9 02/12/2015 1309   ALT 8 12/04/2014 1502   ALT 9* 06/05/2014 1312   BILITOT 0.56 02/12/2015 1309   BILITOT 0.6 12/04/2014 1502   BILITOT 0.60 06/05/2014 1312         Impression and Plan: [name] is 69 year old female with polycythemia vera. She is hyperviscous. She does very well with her hemoglobin below 6. She feels that she does need a phlebotomy. Her hemoglobin is 5.7. We will go ahead and phlebotomize her. She is very "astute" with when she needs to be phlebotomized.  We will go ahead and continue to follow her every 2 weeks for her blood work.  I'll see  her back in 2 months.   Volanda Napoleon, MD 11/30/20161:00 PM

## 2015-02-26 NOTE — Telephone Encounter (Signed)
Critical Value HGB 5.7 Dr Ennever notified. No orders at this time 

## 2015-02-27 ENCOUNTER — Telehealth: Payer: Self-pay | Admitting: *Deleted

## 2015-02-27 NOTE — Telephone Encounter (Signed)
Patient has been thinking and she wants to cancel her appointment for tomorrow. She feels like she wants to wait another week and see where her HGB is. Tomorrow's appointment cancelled and new appointment for labs on Wednesday made. Dr Marin Olp aware.

## 2015-03-05 ENCOUNTER — Other Ambulatory Visit (HOSPITAL_BASED_OUTPATIENT_CLINIC_OR_DEPARTMENT_OTHER): Payer: Medicare Other

## 2015-03-05 DIAGNOSIS — D45 Polycythemia vera: Secondary | ICD-10-CM | POA: Diagnosis present

## 2015-03-05 LAB — CBC WITH DIFFERENTIAL (CANCER CENTER ONLY)
BASO#: 0 10*3/uL (ref 0.0–0.2)
BASO%: 0.2 % (ref 0.0–2.0)
EOS%: 5.5 % (ref 0.0–7.0)
Eosinophils Absolute: 0.3 10*3/uL (ref 0.0–0.5)
HEMATOCRIT: 23.7 % — AB (ref 34.8–46.6)
HEMOGLOBIN: 5.8 g/dL — AB (ref 11.6–15.9)
LYMPH#: 0.6 10*3/uL — AB (ref 0.9–3.3)
LYMPH%: 10.5 % — ABNORMAL LOW (ref 14.0–48.0)
MCH: 17 pg — ABNORMAL LOW (ref 26.0–34.0)
MCHC: 24.5 g/dL — ABNORMAL LOW (ref 32.0–36.0)
MCV: 69 fL — AB (ref 81–101)
MONO#: 0.6 10*3/uL (ref 0.1–0.9)
MONO%: 11.6 % (ref 0.0–13.0)
NEUT%: 72.2 % (ref 39.6–80.0)
NEUTROS ABS: 3.8 10*3/uL (ref 1.5–6.5)
Platelets: 163 10*3/uL (ref 145–400)
RBC: 3.42 10*6/uL — ABNORMAL LOW (ref 3.70–5.32)
RDW: 22.4 % — AB (ref 11.1–15.7)
WBC: 5.3 10*3/uL (ref 3.9–10.0)

## 2015-03-05 LAB — COMPREHENSIVE METABOLIC PANEL
ALBUMIN: 3.5 g/dL (ref 3.5–5.0)
ALK PHOS: 112 U/L (ref 40–150)
ANION GAP: 11 meq/L (ref 3–11)
AST: 11 U/L (ref 5–34)
BILIRUBIN TOTAL: 0.54 mg/dL (ref 0.20–1.20)
BUN: 8.8 mg/dL (ref 7.0–26.0)
CALCIUM: 9.2 mg/dL (ref 8.4–10.4)
CHLORIDE: 103 meq/L (ref 98–109)
CO2: 28 mEq/L (ref 22–29)
CREATININE: 0.8 mg/dL (ref 0.6–1.1)
EGFR: 77 mL/min/{1.73_m2} — ABNORMAL LOW (ref >90)
Glucose: 112 mg/dl (ref 70–140)
Potassium: 3.1 mEq/L — ABNORMAL LOW (ref 3.5–5.1)
Sodium: 141 mEq/L (ref 136–145)
Total Protein: 6.8 g/dL (ref 6.4–8.3)

## 2015-03-12 ENCOUNTER — Other Ambulatory Visit (HOSPITAL_BASED_OUTPATIENT_CLINIC_OR_DEPARTMENT_OTHER): Payer: Medicare Other

## 2015-03-12 ENCOUNTER — Other Ambulatory Visit: Payer: Medicare Other

## 2015-03-12 DIAGNOSIS — D45 Polycythemia vera: Secondary | ICD-10-CM

## 2015-03-12 LAB — CBC WITH DIFFERENTIAL (CANCER CENTER ONLY)
BASO#: 0 10*3/uL (ref 0.0–0.2)
BASO%: 0.2 % (ref 0.0–2.0)
EOS ABS: 0.4 10*3/uL (ref 0.0–0.5)
EOS%: 7 % (ref 0.0–7.0)
HCT: 24.5 % — ABNORMAL LOW (ref 34.8–46.6)
HGB: 6 g/dL — CL (ref 11.6–15.9)
LYMPH#: 0.7 10*3/uL — ABNORMAL LOW (ref 0.9–3.3)
LYMPH%: 13.3 % — AB (ref 14.0–48.0)
MCH: 17.2 pg — AB (ref 26.0–34.0)
MCHC: 24.5 g/dL — AB (ref 32.0–36.0)
MCV: 70 fL — AB (ref 81–101)
MONO#: 0.6 10*3/uL (ref 0.1–0.9)
MONO%: 10.9 % (ref 0.0–13.0)
NEUT#: 3.7 10*3/uL (ref 1.5–6.5)
NEUT%: 68.6 % (ref 39.6–80.0)
PLATELETS: 175 10*3/uL (ref 145–400)
RBC: 3.49 10*6/uL — ABNORMAL LOW (ref 3.70–5.32)
RDW: 22.3 % — AB (ref 11.1–15.7)
WBC: 5.4 10*3/uL (ref 3.9–10.0)

## 2015-03-13 ENCOUNTER — Ambulatory Visit (HOSPITAL_BASED_OUTPATIENT_CLINIC_OR_DEPARTMENT_OTHER): Payer: Medicare Other

## 2015-03-13 ENCOUNTER — Telehealth: Payer: Self-pay | Admitting: Hematology & Oncology

## 2015-03-13 VITALS — BP 125/61 | HR 61 | Temp 97.9°F | Resp 20

## 2015-03-13 DIAGNOSIS — D45 Polycythemia vera: Secondary | ICD-10-CM | POA: Diagnosis present

## 2015-03-13 NOTE — Progress Notes (Signed)
[name] presents today for phlebotomy per MD orders. Phlebotomy procedure started at 1320 and ended at 1335. 500 grams removed. Patient observed for 30 minutes after procedure without any incident. Patient tolerated procedure well. IV needle removed intact.

## 2015-03-13 NOTE — Patient Instructions (Signed)

## 2015-03-25 ENCOUNTER — Other Ambulatory Visit: Payer: Self-pay

## 2015-03-25 DIAGNOSIS — M797 Fibromyalgia: Secondary | ICD-10-CM

## 2015-03-25 DIAGNOSIS — D45 Polycythemia vera: Secondary | ICD-10-CM

## 2015-03-25 DIAGNOSIS — N3090 Cystitis, unspecified without hematuria: Secondary | ICD-10-CM

## 2015-03-25 DIAGNOSIS — I358 Other nonrheumatic aortic valve disorders: Secondary | ICD-10-CM

## 2015-03-25 MED ORDER — OXYCODONE HCL ER 20 MG PO T12A: 20.0000 mg | EXTENDED_RELEASE_TABLET | Freq: Three times a day (TID) | ORAL | Status: DC

## 2015-03-25 MED ORDER — OXYCODONE HCL ER 15 MG PO T12A: 15.0000 mg | EXTENDED_RELEASE_TABLET | Freq: Three times a day (TID) | ORAL | Status: DC | PRN

## 2015-03-25 MED ORDER — CIPROFLOXACIN HCL 500 MG PO TABS: 500.0000 mg | ORAL_TABLET | Freq: Two times a day (BID) | ORAL | Status: DC

## 2015-03-25 MED ORDER — OXYCODONE HCL ER 10 MG PO T12A: 10.0000 mg | EXTENDED_RELEASE_TABLET | Freq: Two times a day (BID) | ORAL | Status: DC

## 2015-03-26 ENCOUNTER — Other Ambulatory Visit: Payer: Self-pay | Admitting: *Deleted

## 2015-03-26 ENCOUNTER — Other Ambulatory Visit: Payer: Self-pay | Admitting: Hematology & Oncology

## 2015-03-26 ENCOUNTER — Other Ambulatory Visit: Payer: Medicare Other

## 2015-03-26 ENCOUNTER — Other Ambulatory Visit (HOSPITAL_BASED_OUTPATIENT_CLINIC_OR_DEPARTMENT_OTHER): Payer: Medicare Other

## 2015-03-26 DIAGNOSIS — D45 Polycythemia vera: Secondary | ICD-10-CM | POA: Diagnosis present

## 2015-03-26 DIAGNOSIS — M797 Fibromyalgia: Secondary | ICD-10-CM

## 2015-03-26 LAB — CBC WITH DIFFERENTIAL (CANCER CENTER ONLY)
BASO#: 0 10*3/uL (ref 0.0–0.2)
BASO%: 0 % (ref 0.0–2.0)
EOS ABS: 0.5 10*3/uL (ref 0.0–0.5)
EOS%: 10.4 % — ABNORMAL HIGH (ref 0.0–7.0)
HCT: 22.4 % — ABNORMAL LOW (ref 34.8–46.6)
HEMOGLOBIN: 5.4 g/dL — AB (ref 11.6–15.9)
LYMPH#: 0.7 10*3/uL — ABNORMAL LOW (ref 0.9–3.3)
LYMPH%: 16.2 % (ref 14.0–48.0)
MCH: 17.1 pg — AB (ref 26.0–34.0)
MCHC: 24.1 g/dL — ABNORMAL LOW (ref 32.0–36.0)
MCV: 71 fL — ABNORMAL LOW (ref 81–101)
MONO#: 0.6 10*3/uL (ref 0.1–0.9)
MONO%: 13.5 % — AB (ref 0.0–13.0)
NEUT%: 59.9 % (ref 39.6–80.0)
NEUTROS ABS: 2.7 10*3/uL (ref 1.5–6.5)
Platelets: 167 10*3/uL (ref 145–400)
RBC: 3.16 10*6/uL — AB (ref 3.70–5.32)
RDW: 21.5 % — ABNORMAL HIGH (ref 11.1–15.7)
WBC: 4.5 10*3/uL (ref 3.9–10.0)

## 2015-03-26 MED ORDER — OXYCODONE HCL 15 MG PO TABS: 15.0000 mg | ORAL_TABLET | Freq: Four times a day (QID) | ORAL | Status: DC | PRN

## 2015-04-09 ENCOUNTER — Telehealth: Payer: Self-pay | Admitting: *Deleted

## 2015-04-09 ENCOUNTER — Other Ambulatory Visit (HOSPITAL_BASED_OUTPATIENT_CLINIC_OR_DEPARTMENT_OTHER): Payer: Medicare Other

## 2015-04-09 ENCOUNTER — Other Ambulatory Visit: Payer: Medicare Other

## 2015-04-09 DIAGNOSIS — D45 Polycythemia vera: Secondary | ICD-10-CM | POA: Diagnosis present

## 2015-04-09 LAB — COMPREHENSIVE METABOLIC PANEL
ALK PHOS: 99 U/L (ref 40–150)
ANION GAP: 8 meq/L (ref 3–11)
AST: 12 U/L (ref 5–34)
Albumin: 3.4 g/dL — ABNORMAL LOW (ref 3.5–5.0)
BILIRUBIN TOTAL: 0.58 mg/dL (ref 0.20–1.20)
BUN: 8.4 mg/dL (ref 7.0–26.0)
CO2: 29 mEq/L (ref 22–29)
Calcium: 8.7 mg/dL (ref 8.4–10.4)
Chloride: 105 mEq/L (ref 98–109)
Creatinine: 0.8 mg/dL (ref 0.6–1.1)
EGFR: 77 mL/min/{1.73_m2} — AB (ref >90)
GLUCOSE: 125 mg/dL (ref 70–140)
POTASSIUM: 3.4 meq/L — AB (ref 3.5–5.1)
SODIUM: 143 meq/L (ref 136–145)
Total Protein: 6.4 g/dL (ref 6.4–8.3)

## 2015-04-09 LAB — CBC WITH DIFFERENTIAL (CANCER CENTER ONLY)
BASO#: 0 10*3/uL (ref 0.0–0.2)
BASO%: 0 % (ref 0.0–2.0)
EOS%: 8.5 % — AB (ref 0.0–7.0)
Eosinophils Absolute: 0.4 10*3/uL (ref 0.0–0.5)
HEMATOCRIT: 22.1 % — AB (ref 34.8–46.6)
HGB: 5.3 g/dL — CL (ref 11.6–15.9)
LYMPH#: 0.6 10*3/uL — AB (ref 0.9–3.3)
LYMPH%: 14.8 % (ref 14.0–48.0)
MCH: 16.9 pg — ABNORMAL LOW (ref 26.0–34.0)
MCHC: 24 g/dL — AB (ref 32.0–36.0)
MCV: 70 fL — AB (ref 81–101)
MONO#: 0.5 10*3/uL (ref 0.1–0.9)
MONO%: 11 % (ref 0.0–13.0)
NEUT#: 2.8 10*3/uL (ref 1.5–6.5)
NEUT%: 65.7 % (ref 39.6–80.0)
PLATELETS: 145 10*3/uL (ref 145–400)
RBC: 3.14 10*6/uL — ABNORMAL LOW (ref 3.70–5.32)
RDW: 21 % — AB (ref 11.1–15.7)
WBC: 4.3 10*3/uL (ref 3.9–10.0)

## 2015-04-09 NOTE — Telephone Encounter (Signed)
Critical Value HGB 5.3 Dr Marin Olp notified. No orders received.

## 2015-04-10 ENCOUNTER — Other Ambulatory Visit: Payer: Self-pay | Admitting: Hematology & Oncology

## 2015-04-10 ENCOUNTER — Telehealth: Payer: Self-pay | Admitting: Cardiology

## 2015-04-10 NOTE — Telephone Encounter (Signed)
New message    Patient did not want to disclose any information wants nurse to call her.

## 2015-04-10 NOTE — Telephone Encounter (Signed)
Spoke with pt and she stated that she wanted [name] to call her back. Pt stated it was a personal call and she wanted to speak to [facility] about an appt as well. Pt asked that I get this message to [facility] and tell her to call late afternoon because she does her shopping in the mornings. Advised pt that I would get this message to [facility] and have her follow up tomorrow when she is back in the office.

## 2015-04-11 NOTE — Telephone Encounter (Signed)
Scheduled follow up ov with  Dr. Mare Ferrari

## 2015-04-19 ENCOUNTER — Other Ambulatory Visit: Payer: Self-pay | Admitting: Hematology & Oncology

## 2015-04-19 ENCOUNTER — Other Ambulatory Visit: Payer: Self-pay | Admitting: Cardiology

## 2015-04-23 ENCOUNTER — Other Ambulatory Visit: Payer: Medicare Other

## 2015-04-23 ENCOUNTER — Other Ambulatory Visit (HOSPITAL_BASED_OUTPATIENT_CLINIC_OR_DEPARTMENT_OTHER): Payer: Medicare Other

## 2015-04-23 ENCOUNTER — Telehealth: Payer: Self-pay | Admitting: *Deleted

## 2015-04-23 DIAGNOSIS — I358 Other nonrheumatic aortic valve disorders: Secondary | ICD-10-CM | POA: Diagnosis not present

## 2015-04-23 DIAGNOSIS — D45 Polycythemia vera: Secondary | ICD-10-CM

## 2015-04-23 DIAGNOSIS — M797 Fibromyalgia: Secondary | ICD-10-CM

## 2015-04-23 LAB — CBC WITH DIFFERENTIAL (CANCER CENTER ONLY)
BASO#: 0 10*3/uL (ref 0.0–0.2)
BASO%: 0.2 % (ref 0.0–2.0)
EOS%: 7.9 % — AB (ref 0.0–7.0)
Eosinophils Absolute: 0.4 10*3/uL (ref 0.0–0.5)
HEMATOCRIT: 21.9 % — AB (ref 34.8–46.6)
HEMOGLOBIN: 5.4 g/dL — AB (ref 11.6–15.9)
LYMPH#: 0.7 10*3/uL — AB (ref 0.9–3.3)
LYMPH%: 16.1 % (ref 14.0–48.0)
MCH: 17.2 pg — ABNORMAL LOW (ref 26.0–34.0)
MCHC: 24.7 g/dL — ABNORMAL LOW (ref 32.0–36.0)
MCV: 70 fL — ABNORMAL LOW (ref 81–101)
MONO#: 0.5 10*3/uL (ref 0.1–0.9)
MONO%: 11 % (ref 0.0–13.0)
NEUT%: 64.8 % (ref 39.6–80.0)
NEUTROS ABS: 2.9 10*3/uL (ref 1.5–6.5)
Platelets: 151 10*3/uL (ref 145–400)
RBC: 3.14 10*6/uL — AB (ref 3.70–5.32)
RDW: 21.3 % — ABNORMAL HIGH (ref 11.1–15.7)
WBC: 4.5 10*3/uL (ref 3.9–10.0)

## 2015-04-23 LAB — COMPREHENSIVE METABOLIC PANEL
ALBUMIN: 3.4 g/dL — AB (ref 3.5–5.0)
ALK PHOS: 101 U/L (ref 40–150)
AST: 12 U/L (ref 5–34)
Anion Gap: 5 mEq/L (ref 3–11)
BUN: 7.8 mg/dL (ref 7.0–26.0)
CALCIUM: 8.8 mg/dL (ref 8.4–10.4)
CO2: 32 mEq/L — ABNORMAL HIGH (ref 22–29)
CREATININE: 0.7 mg/dL (ref 0.6–1.1)
Chloride: 103 mEq/L (ref 98–109)
EGFR: 81 mL/min/{1.73_m2} — ABNORMAL LOW (ref >90)
GLUCOSE: 106 mg/dL (ref 70–140)
Potassium: 3.5 mEq/L (ref 3.5–5.1)
Sodium: 140 mEq/L (ref 136–145)
TOTAL PROTEIN: 6.4 g/dL (ref 6.4–8.3)
Total Bilirubin: 0.68 mg/dL (ref 0.20–1.20)

## 2015-04-23 NOTE — Telephone Encounter (Signed)
Critical Value HGB 5.4 Dr Marin Olp notified. No orders received.

## 2015-04-24 LAB — IRON AND TIBC
%SAT: 3 % — AB (ref 21–57)
IRON: 11 ug/dL — AB (ref 41–142)
TIBC: 423 ug/dL (ref 236–444)
UIBC: 411 ug/dL — AB (ref 120–384)

## 2015-04-24 LAB — FERRITIN: FERRITIN: 5 ng/mL — AB (ref 9–269)

## 2015-04-28 ENCOUNTER — Other Ambulatory Visit (HOSPITAL_BASED_OUTPATIENT_CLINIC_OR_DEPARTMENT_OTHER): Payer: Medicare Other

## 2015-04-28 ENCOUNTER — Encounter: Payer: Self-pay | Admitting: Hematology & Oncology

## 2015-04-28 ENCOUNTER — Ambulatory Visit (HOSPITAL_BASED_OUTPATIENT_CLINIC_OR_DEPARTMENT_OTHER): Payer: Medicare Other | Admitting: Hematology & Oncology

## 2015-04-28 VITALS — BP 120/56 | HR 73 | Temp 97.5°F | Resp 16 | Ht 62.0 in | Wt 159.0 lb

## 2015-04-28 DIAGNOSIS — D45 Polycythemia vera: Secondary | ICD-10-CM | POA: Diagnosis not present

## 2015-04-28 DIAGNOSIS — M797 Fibromyalgia: Secondary | ICD-10-CM

## 2015-04-28 DIAGNOSIS — Z8673 Personal history of transient ischemic attack (TIA), and cerebral infarction without residual deficits: Secondary | ICD-10-CM

## 2015-04-28 DIAGNOSIS — I358 Other nonrheumatic aortic valve disorders: Secondary | ICD-10-CM

## 2015-04-28 LAB — CBC WITH DIFFERENTIAL (CANCER CENTER ONLY)
BASO#: 0 10*3/uL (ref 0.0–0.2)
BASO%: 0 % (ref 0.0–2.0)
EOS%: 8.7 % — ABNORMAL HIGH (ref 0.0–7.0)
Eosinophils Absolute: 0.4 10*3/uL (ref 0.0–0.5)
HEMATOCRIT: 24.4 % — AB (ref 34.8–46.6)
HEMOGLOBIN: 6 g/dL — AB (ref 11.6–15.9)
LYMPH#: 0.6 10*3/uL — AB (ref 0.9–3.3)
LYMPH%: 13.9 % — ABNORMAL LOW (ref 14.0–48.0)
MCH: 16.9 pg — ABNORMAL LOW (ref 26.0–34.0)
MCHC: 24.6 g/dL — ABNORMAL LOW (ref 32.0–36.0)
MCV: 69 fL — AB (ref 81–101)
MONO#: 0.6 10*3/uL (ref 0.1–0.9)
MONO%: 13.7 % — ABNORMAL HIGH (ref 0.0–13.0)
NEUT#: 2.8 10*3/uL (ref 1.5–6.5)
NEUT%: 63.7 % (ref 39.6–80.0)
Platelets: 150 10*3/uL (ref 145–400)
RBC: 3.55 10*6/uL — AB (ref 3.70–5.32)
RDW: 21.4 % — ABNORMAL HIGH (ref 11.1–15.7)
WBC: 4.4 10*3/uL (ref 3.9–10.0)

## 2015-04-28 LAB — TECHNOLOGIST REVIEW CHCC SATELLITE

## 2015-04-28 MED ORDER — OXYCODONE HCL ER 20 MG PO T12A: 20.0000 mg | EXTENDED_RELEASE_TABLET | Freq: Three times a day (TID) | ORAL | Status: DC

## 2015-04-28 MED ORDER — OXYCODONE HCL 15 MG PO TABS: 15.0000 mg | ORAL_TABLET | Freq: Four times a day (QID) | ORAL | Status: DC | PRN

## 2015-04-28 MED ORDER — OXYCODONE HCL ER 10 MG PO T12A: 10.0000 mg | EXTENDED_RELEASE_TABLET | Freq: Two times a day (BID) | ORAL | Status: DC

## 2015-04-28 NOTE — Progress Notes (Signed)
Hematology and Oncology Follow Up Visit  [name] [id number] 01-26-1946 70 y.o. 04/28/2015   Principle Diagnosis:  Polycythemia vera -- hyperviscosity variant. 2. 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  Current Therapy:    Phlebotomy to maintain hemoglobin below 6.0     Interim History:  [name] is back for followup. She is doing okay. She does feel a bit more tired. This probably been a couple months that she was phlebotomized.  She has had no abdominal pain. There maybe occasional right upper quadrant discomfort. I suspect this might be scar tissue from past gallbladder surgery.. She is clearly iron deficient. I told her that she concerned he was she would like. I'll think that would raise her iron levels up. We last saw her a week ago, her ferritin was 5 with an iron saturation 3%.  She's not had any issues with fever. She's had no leg swelling. She's had no rashes.     Overall, her performance status is ECOG 1. Medications:Allergies:  Allergies  Allergen Reactions  . Ethrane [Enflurane] Nausea And Vomiting  . Codeine Nausea Only  . Synthroid [Levothyroxine Sodium] Other (See Comments)    Not effective. Causes excessive sleepiness. Takes armour thyroid  . Calcium Channel Blockers Swelling, Palpitations and Hypertension  . Penicillins Rash    Past Medical History, Surgical history, Social history, and Family History were reviewed and updated.  Review of Systems: As above  Physical Exam:  height is 5\' 2"  (1.575 m) and weight is 159 lb (72.122 kg). Her oral temperature is 97.5 F (36.4 C). Her blood pressure is 120/56 and her pulse is 73. Her respiration is 16.   Well-developed and well-nourished white female in no obvious distress. Head and neck exam shows pale conjunctiva. There is no oral lesions. There is no adenopathy in the neck. Lungs are clear bilaterally. Cardiac exam regular rate and rhythm with a 1/6 systolic ejection  murmur. Abdomen is soft. Has good bowel sounds. There is no palpable liver or spleen tip appreciative well-healed laparotomy scars. Back exam no tenderness over the spine ribs or hips. Extremities shows no clubbing cyanosis or edema. Neurological exam shows no focal neurological deficits. Skin exam no rashes, ecchymoses or petechia.  Lab Results  Component Value Date   WBC 4.4 04/28/2015   HGB 6.0* 04/28/2015   HCT 24.4* 04/28/2015   MCV 69* 04/28/2015   PLT 150 04/28/2015     Chemistry      Component Value Date/Time   NA 140 04/23/2015 1253   NA 136 12/04/2014 1502   NA 143 06/05/2014 1312   K 3.5 04/23/2015 1253   K 3.1* 12/04/2014 1502   K 3.3 06/05/2014 1312   CL 95* 12/04/2014 1502   CL 102 06/05/2014 1312   CO2 32* 04/23/2015 1253   CO2 30 12/04/2014 1502   CO2 32 06/05/2014 1312   BUN 7.8 04/23/2015 1253   BUN 10 12/04/2014 1502   BUN 6* 06/05/2014 1312   CREATININE 0.7 04/23/2015 1253   CREATININE 0.84 12/04/2014 1502   CREATININE 0.8 06/05/2014 1312      Component Value Date/Time   CALCIUM 8.8 04/23/2015 1253   CALCIUM 9.0 12/04/2014 1502   CALCIUM 8.9 06/05/2014 1312   ALKPHOS 101 04/23/2015 1253   ALKPHOS 97 12/04/2014 1502   ALKPHOS 100* 06/05/2014 1312   AST 12 04/23/2015 1253   AST 14 12/04/2014 1502   AST 22 06/05/2014 1312  ALT <9 04/23/2015 1253   ALT 8 12/04/2014 1502   ALT 9* 06/05/2014 1312   BILITOT 0.68 04/23/2015 1253   BILITOT 0.6 12/04/2014 1502   BILITOT 0.60 06/05/2014 1312         Impression and Plan: [name] is 70 year old female with polycythemia vera. She is hyperviscous.   Her hemoglobin is up a little bit today. I suspect she probably will need to be phlebotomized.  Otherwise, everything seems be doing pretty well.  We will go ahead and plan to get her back to see [place] in 8 weeks. She comes in every 2 weeks for lab work. I'll see her back in 2 months.   Sarah Napoleon, MD 1/30/20171:14 PM

## 2015-05-01 ENCOUNTER — Ambulatory Visit (HOSPITAL_BASED_OUTPATIENT_CLINIC_OR_DEPARTMENT_OTHER): Payer: Medicare Other

## 2015-05-01 VITALS — BP 106/67 | HR 76 | Temp 97.7°F

## 2015-05-01 DIAGNOSIS — D45 Polycythemia vera: Secondary | ICD-10-CM | POA: Diagnosis present

## 2015-05-01 NOTE — Progress Notes (Signed)
[name] presents today for phlebotomy per MD orders. Phlebotomy procedure started at 1150 and ended at 1205. 500 grams removed. Patient observed for 30 minutes after procedure without any incident. Patient tolerated procedure well. IV needle removed intact.

## 2015-05-01 NOTE — Patient Instructions (Signed)

## 2015-05-07 ENCOUNTER — Telehealth: Payer: Self-pay | Admitting: *Deleted

## 2015-05-07 ENCOUNTER — Other Ambulatory Visit (HOSPITAL_BASED_OUTPATIENT_CLINIC_OR_DEPARTMENT_OTHER): Payer: Medicare Other

## 2015-05-07 DIAGNOSIS — D45 Polycythemia vera: Secondary | ICD-10-CM

## 2015-05-07 LAB — CBC WITH DIFFERENTIAL (CANCER CENTER ONLY)
BASO#: 0 10*3/uL (ref 0.0–0.2)
BASO%: 0.2 % (ref 0.0–2.0)
EOS ABS: 0.4 10*3/uL (ref 0.0–0.5)
EOS%: 9.9 % — ABNORMAL HIGH (ref 0.0–7.0)
HCT: 21.4 % — ABNORMAL LOW (ref 34.8–46.6)
HGB: 5.2 g/dL — CL (ref 11.6–15.9)
LYMPH#: 0.7 10*3/uL — ABNORMAL LOW (ref 0.9–3.3)
LYMPH%: 17 % (ref 14.0–48.0)
MCH: 16.9 pg — AB (ref 26.0–34.0)
MCHC: 24.3 g/dL — AB (ref 32.0–36.0)
MCV: 70 fL — ABNORMAL LOW (ref 81–101)
MONO#: 0.6 10*3/uL (ref 0.1–0.9)
MONO%: 13.5 % — AB (ref 0.0–13.0)
NEUT#: 2.4 10*3/uL (ref 1.5–6.5)
NEUT%: 59.4 % (ref 39.6–80.0)
PLATELETS: 161 10*3/uL (ref 145–400)
RBC: 3.07 10*6/uL — ABNORMAL LOW (ref 3.70–5.32)
RDW: 20.7 % — ABNORMAL HIGH (ref 11.1–15.7)
WBC: 4.1 10*3/uL (ref 3.9–10.0)

## 2015-05-07 LAB — COMPREHENSIVE METABOLIC PANEL
ANION GAP: 11 meq/L (ref 3–11)
AST: 14 U/L (ref 5–34)
Albumin: 3.6 g/dL (ref 3.5–5.0)
Alkaline Phosphatase: 110 U/L (ref 40–150)
BUN: 9 mg/dL (ref 7.0–26.0)
CHLORIDE: 100 meq/L (ref 98–109)
CO2: 29 mEq/L (ref 22–29)
Calcium: 8.8 mg/dL (ref 8.4–10.4)
Creatinine: 0.8 mg/dL (ref 0.6–1.1)
EGFR: 71 mL/min/{1.73_m2} — AB (ref >90)
GLUCOSE: 124 mg/dL (ref 70–140)
Potassium: 3.4 mEq/L — ABNORMAL LOW (ref 3.5–5.1)
SODIUM: 140 meq/L (ref 136–145)
Total Bilirubin: 0.78 mg/dL (ref 0.20–1.20)
Total Protein: 6.6 g/dL (ref 6.4–8.3)

## 2015-05-07 NOTE — Telephone Encounter (Signed)
Critical Value HGB 5.2 Dr Marin Olp notified. No orders received

## 2015-05-14 ENCOUNTER — Other Ambulatory Visit: Payer: Self-pay | Admitting: Hematology & Oncology

## 2015-05-19 ENCOUNTER — Other Ambulatory Visit: Payer: Self-pay | Admitting: Cardiology

## 2015-05-19 NOTE — Telephone Encounter (Signed)
Rx request sent to pharmacy.  

## 2015-05-20 ENCOUNTER — Other Ambulatory Visit: Payer: Self-pay | Admitting: Cardiology

## 2015-05-21 ENCOUNTER — Telehealth: Payer: Self-pay | Admitting: *Deleted

## 2015-05-21 ENCOUNTER — Other Ambulatory Visit (HOSPITAL_BASED_OUTPATIENT_CLINIC_OR_DEPARTMENT_OTHER): Payer: Medicare Other

## 2015-05-21 DIAGNOSIS — M797 Fibromyalgia: Secondary | ICD-10-CM

## 2015-05-21 DIAGNOSIS — I358 Other nonrheumatic aortic valve disorders: Secondary | ICD-10-CM

## 2015-05-21 DIAGNOSIS — D45 Polycythemia vera: Secondary | ICD-10-CM | POA: Diagnosis present

## 2015-05-21 LAB — CBC WITH DIFFERENTIAL (CANCER CENTER ONLY)
BASO#: 0 10*3/uL (ref 0.0–0.2)
BASO%: 0 % (ref 0.0–2.0)
EOS ABS: 0.3 10*3/uL (ref 0.0–0.5)
EOS%: 6.8 % (ref 0.0–7.0)
HEMATOCRIT: 21.6 % — AB (ref 34.8–46.6)
HGB: 5.3 g/dL — CL (ref 11.6–15.9)
LYMPH#: 0.8 10*3/uL — AB (ref 0.9–3.3)
LYMPH%: 18.2 % (ref 14.0–48.0)
MCH: 17.4 pg — AB (ref 26.0–34.0)
MCHC: 24.5 g/dL — ABNORMAL LOW (ref 32.0–36.0)
MCV: 71 fL — ABNORMAL LOW (ref 81–101)
MONO#: 0.5 10*3/uL (ref 0.1–0.9)
MONO%: 13.1 % — ABNORMAL HIGH (ref 0.0–13.0)
NEUT#: 2.5 10*3/uL (ref 1.5–6.5)
NEUT%: 61.9 % (ref 39.6–80.0)
Platelets: 188 10*3/uL (ref 145–400)
RBC: 3.04 10*6/uL — ABNORMAL LOW (ref 3.70–5.32)
RDW: 21.6 % — AB (ref 11.1–15.7)
WBC: 4.1 10*3/uL (ref 3.9–10.0)

## 2015-05-21 NOTE — Telephone Encounter (Signed)
Critical Value Hgb 5.3 Dr Ennever notified. No orders at this time 

## 2015-05-22 ENCOUNTER — Ambulatory Visit (INDEPENDENT_AMBULATORY_CARE_PROVIDER_SITE_OTHER): Payer: Medicare Other | Admitting: Cardiology

## 2015-05-22 ENCOUNTER — Encounter: Payer: Self-pay | Admitting: Cardiology

## 2015-05-22 VITALS — BP 108/52 | HR 77 | Ht 63.0 in | Wt 163.0 lb

## 2015-05-22 DIAGNOSIS — E039 Hypothyroidism, unspecified: Secondary | ICD-10-CM

## 2015-05-22 DIAGNOSIS — D45 Polycythemia vera: Secondary | ICD-10-CM

## 2015-05-22 DIAGNOSIS — I119 Hypertensive heart disease without heart failure: Secondary | ICD-10-CM | POA: Diagnosis not present

## 2015-05-22 MED ORDER — POTASSIUM CHLORIDE ER 10 MEQ PO CPCR: ORAL_CAPSULE | ORAL | Status: DC

## 2015-05-22 NOTE — Addendum Note (Signed)
Addended by: Alvina Filbert B on: 05/22/2015 05:12 PM   Modules accepted: Orders

## 2015-05-22 NOTE — Patient Instructions (Signed)
Medication Instructions:  Your physician recommends that you continue on your current medications as directed. Please refer to the Current Medication list given to you today.  Labwork: NONE  Testing/Procedures: NONE  Follow-Up: AS NEEDED   If you need a refill on your cardiac medications before your next appointment, please call your pharmacy.

## 2015-05-22 NOTE — Progress Notes (Signed)
Cardiology Office Note   Date:  05/22/2015   ID:  Sarah, Hobbs [birth year], MRN [id number]  PCP:  Warren Danes, MD  Cardiologist: Darlin Coco MD  Chief Complaint  Patient presents with  . scheduled follow up      History of Present Illness: [name] is a 70 y.o. female who presents for scheduled four-month follow-up visit  She has a history of polycythemia vera and a history of hypothyroidism. She has a history of aortic valve sclerosis and essential hypertension. She has atypical chest pain and does not have any history of ischemic heart disease. She has a past history of PVCs.  The patient is status post cholecystectomy. Dr. Hulen Skains is her surgeon. 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 She has had a recent left carotid endarterectomy. She had carotid duplex ultrasound on 11/07/13 which showed 40-59% flow reduction in both arteries. She was readmitted to [facility] with some type of atypical focal seizure and is now on Keppra. She is followed for this by Dr. Leonie Man. He has reduced her long-term dose of Keppra slightly and if she does well over the next 6 months she has been told that she will have further reduced at that time but that she will always need to be on some dose. She does not have any history of ischemic heart disease. She had an echocardiogram 10/04/12 showing ejection fraction 65-70% with grade 1 diastolic dysfunction. There was elevated pulmonary artery pressure of 53 mm mercury. Since last visit she's had no new cardiac symptoms. She has had no seizures.  She denies any chest pain. No awareness of recent PVCs or palpitations. She is having less daytime somnolence since her thyroid hormone replacement was maintained at a higher level of 2.5 gr of Armour Thyroid daily. She did not do well with a trial of the lesser dose. She is finally off all of her female hormone replacement therapy. Her last  phlebotomy was about 3 weeks ago. Her hemoglobin yesterday was 5.3.  That is where she likes to stay.  If her hemoglobin gets up around 6, she starts to have symptoms which she attributes to hyperviscosity..  She has had some symptoms of depression and decreasing short-term memory.    Past Medical History  Diagnosis Date  . Fibromyalgia     chronic pain syndrome  . Postmenopausal state     on hormone replacement therapy  . Aortic valve sclerosis     by echocardiogram 12/04/2009  . Hyperlipidemia     "not since carotid OR" (12/28/2012)  . 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 GERD (gastroesophageal reflux disease)   . PAC (premature atrial contraction)   . Chronic constipation     takes Mineral Oil,Juice,Enema(prn),and Miralax(Prn) and Cascara nightly  . Insomnia     takes restoril nightly and Xanax  . Hypothyroidism   . Seasonal allergies     takes Allegra in am and Benadryl at night  . Occlusion and stenosis of carotid artery without mention of cerebral infarction 10/26/2012  . Complex partial seizure disorder ([place]) 10/15/2012  . Acute cholecystitis 10/07/2012    s/p pec drain due to recent CVA, pending chole 11/2012  . Difficult intubation     grade 3 airway  . PONV (postoperative nausea and vomiting)     "I vomit for 5 days straight w/certain RX w/thanes" (12/28/2012)  .  Hypertension     "not since carotid OR" (12/28/2012)  . Heart murmur     "flow mumur" (12/28/2012)  . Exertional dyspnea   . Asthma     "wheeze occasionally; I don't have asthma" (12/28/2012)  . Anemia     "due to polycythemia vera" (12/28/2012)  . History of blood transfusion 09/2012; 12/28/2012  . Seizures ([place])     only seizure was 10/14/12 "after carotid endarterectomy";takes Keppra daily  . Stroke [facility]) 09/2012    "after they put drain in my gallbladder"; residual is "haven't felt well enough to tell since stroke to tell; weak already; weaker when I'm tired"  (12/28/2012)  . Chronic back pain   . Anxiety   . 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 Endarterectomy Left 10/10/2012    Procedure: ENDARTERECTOMY CAROTID- LEFT NECK WITH GREATER SAPHENOUS VEIN PATCH LEFT LEG;  Surgeon: Elam Dutch, MD;  Location: [facility];  Service: Vascular;  Laterality: Left;  . Carotid endarterectomy Left 10-10-12  . Abdominal hysterectomy  1996  . Tonsillectomy      age 21  . Nasal septum surgery      late 70's  . Colonoscopy    . Insertion of drain  10/02/12    right low abdomen and draining pus  . Picc line placed      10/15/12  . Tubal ligation  ~ 1972  . Esophagogastroduodenoscopy N/A 01/02/2013    Procedure: ESOPHAGOGASTRODUODENOSCOPY (EGD);  Surgeon: Inda Castle, MD;  Location: [facility];  Service: Endoscopy;  Laterality: N/A;  . Cholecystectomy N/A 11/29/2012    Procedure: ATTEMPTED LAPAROSCOPIC CHOLECYSTECTOMY ;  Surgeon: Gwenyth Ober, MD;  Location: [facility];  Service: General;  Laterality: N/A;  . Cholecystectomy N/A 11/29/2012    Procedure: CHOLECYSTECTOMY, COMMON BILE DUCT EXPLORATION, T-TUBE PLACEMENT;  Surgeon: Gwenyth Ober, MD;  Location: [facility];  Service: General;  Laterality: N/A;  . Colonoscopy N/A 05/14/2013    Procedure: COLONOSCOPY;  Surgeon: Inda Castle, MD;  Location: [facility];  Service: Endoscopy;  Laterality: N/A;     Current Outpatient Prescriptions  Medication Sig Dispense Refill  . ALPRAZolam (XANAX) 0.5 MG tablet Take 0.5 mg by mouth every 6 (six) hours as needed for anxiety.    Marland Kitchen aspirin 325 MG tablet Take 325 mg by mouth daily after breakfast. "to thin  Blood"    . bisoprolol (ZEBETA) 10 MG tablet TAKE 1 TABLET BY MOUTH EVERY DAY 90 tablet 3  . Cascara [place] 450 MG CAPS Take 900 mg by mouth at bedtime.    . cephALEXin (KEFLEX) 500 MG capsule Take 500 mg by mouth as needed (deep chest congestion).   2  .  ciprofloxacin (CIPRO) 500 MG tablet TAKE 1 TABLET BY MOUTH TWICE A DAY FOR 14 DAYS 28 tablet 0  . clopidogrel (PLAVIX) 75 MG tablet TAKE 1 TABLET (75 MG TOTAL) BY MOUTH DAILY. 30 tablet 3  . cyanocobalamin (,VITAMIN B-12,) 1000 MCG/ML injection Inject 1,000 mcg into the muscle as needed (for energy).    . cyclobenzaprine (FLEXERIL) 10 MG tablet Take 10 mg by mouth every 6 (six) hours as needed for muscle spasms.    . diphenhydrAMINE (BENADRYL) 25 MG tablet Take 50 mg by mouth every 8 (eight) hours as needed. Patient may take 50 mg three times a day as needed for allergies    .  docusate sodium (COLACE) 100 MG capsule Take 100 mg by mouth 3 (three) times daily with meals.     . famotidine (PEPCID) 20 MG tablet TAKE 1 TABLET BY MOUTH TWICE A DAY BEFORE A MEAL *4/29* 60 tablet 0  . fexofenadine (ALLEGRA) 180 MG tablet Take 180 mg by mouth daily after breakfast.     . furosemide (LASIX) 40 MG tablet Take 1 tablet (40 mg total) by mouth 2 (two) times daily. 60 tablet 11  . Garlic Oil 123XX123 MG CAPS Take 1,000 mg by mouth daily after breakfast.    . guaiFENesin (MUCINEX) 600 MG 12 hr tablet Take 600 mg by mouth 2 (two) times daily as needed for cough.    . hydrochlorothiazide (MICROZIDE) 12.5 MG capsule TAKE ONE CAPSULE BY MOUTH EVERY DAY AS NEEDED IF SYSTOLIC IS GREATER THAN [id number] 30 capsule 10  . Ipratropium-Albuterol (COMBIVENT RESPIMAT) 20-100 MCG/ACT AERS respimat Inhale 2 puffs into the lungs 4 (four) times daily.    Marland Kitchen levETIRAcetam (KEPPRA) 500 MG tablet Take by mouth 2 (two) times daily. Take 1/2 tablet by mouth in the morning and 1 tablet by mouth in the evening    . lidocaine-prilocaine (EMLA) cream Apply 1 application topically as needed (to numb the phlebotomy site). 30 g 6  . lisinopril (PRINIVIL,ZESTRIL) 10 MG tablet Take 10 mg by mouth 2 (two) times daily as needed. If Systolic is GREATER than [id number]    . magnesium oxide (MAG-OX) 400 MG tablet Take 1,200 mg by mouth daily.    .  Methylsulfonylmethane (MSM) 1000 MG CAPS Take 3 capsules by mouth daily. With breakfast    . mineral oil liquid Take 60 mLs by mouth at bedtime. With Juice    . Misc Natural Products (TART CHERRY ADVANCED PO) Take 1,200 mg by mouth 2 (two) times daily.    . nitroGLYCERIN (NITROSTAT) 0.4 MG SL tablet Place 0.4 mg under the tongue every 5 (five) minutes as needed for chest pain.    Marland Kitchen ondansetron (ZOFRAN) 8 MG tablet Take 8 mg by mouth every 8 (eight) hours as needed for nausea or vomiting.     . ondansetron (ZOFRAN-ODT) 4 MG disintegrating tablet Take 4 mg by mouth every 8 (eight) hours as needed for nausea or vomiting (nausea).    Marland Kitchen oxyCODONE (OXYCONTIN) 10 mg 12 hr tablet Take 1 tablet (10 mg total) by mouth 2 (two) times daily. Take one tablet by mouth at 8am and one tablet by mouth at 4pm 60 tablet 0  . oxyCODONE (OXYCONTIN) 20 mg 12 hr tablet Take 1 tablet (20 mg total) by mouth 3 (three) times daily. Take 1 tablet by mouth at 12noon, 8pm and 4am 90 tablet 0  . oxyCODONE (ROXICODONE) 15 MG immediate release tablet Take 15 mg by mouth every 6 (six) hours as needed for pain.    . potassium chloride (MICRO-K) 10 MEQ CR capsule Take 10 mEq by mouth. Take 1 tablet by mouth eight times daily    . promethazine (PHENERGAN) 25 MG tablet Take 25 mg by mouth every 6 (six) hours as needed for nausea.    . Sennosides (SENNA LAX PO) Take 1 tablet by mouth 3 (three) times daily after meals.     . temazepam (RESTORIL) 30 MG capsule Take 30 mg by mouth at bedtime as needed for sleep.    Marland Kitchen thyroid (ARMOUR THYROID) 60 MG tablet Take 2.5 tablets (150 mg total) by mouth daily. 75 tablet 5  . triamcinolone cream (KENALOG) 0.1 %  Apply 1 application topically 2 (two) times daily as needed (to skin).    . Valerian 100 MG CAPS Take 100 mg by mouth at bedtime as needed. Take for sleep    . Vitamin D, Cholecalciferol, 1000 UNITS TABS Take 4,000 Units by mouth every morning.     No current facility-administered medications  for this visit.    Allergies:   Ethrane; Codeine; Synthroid; Calcium channel blockers; and Penicillins    Social History:  The patient  reports that she has never smoked. She has never used smokeless tobacco. She reports that she does not drink alcohol or use illicit drugs.   Family History:  The patient's family history includes Coronary artery disease in her mother; Heart attack in her father and mother; Heart disease in her father and mother; Hyperlipidemia in her sister; Hypertension in her sister.    ROS:  Please see the history of present illness.   Otherwise, review of systems are positive for none.   All other systems are reviewed and negative.    PHYSICAL EXAM: VS:  BP 108/52 mmHg  Pulse 77  Ht 5\' 3"  (1.6 m)  Wt 163 lb (73.936 kg)  BMI 28.88 kg/m2 , BMI Body mass index is 28.88 kg/(m^2). GEN: Well nourished, well developed, in no acute distress HEENT: normal Neck: no JVD, carotid bruits, or masses Cardiac: RRR; no murmurs, rubs, or gallops,no edema  Respiratory:  clear to auscultation bilaterally, normal work of breathing GI: soft, nontender, nondistended, + BS MS: no deformity or atrophy Skin: warm and dry, no rash Neuro:  Strength and sensation are intact Psych: euthymic mood, full affect   EKG:  EKG is not ordered today.    Recent Labs: 12/31/2014: TSH <0.080* 05/07/2015: ALT <9; BUN 9.0; Creatinine 0.8; Potassium 3.4*; Sodium 140 05/21/2015: HGB 5.3*; Platelets 188    Lipid Panel    Component Value Date/Time   CHOL 58 10/07/2012 0700   TRIG 121 10/07/2012 0700   HDL 11* 10/07/2012 0700   CHOLHDL 5.3 10/07/2012 0700   VLDL 24 10/07/2012 0700   LDLCALC 23 10/07/2012 0700      Wt Readings from Last 3 Encounters:  05/22/15 163 lb (73.936 kg)  04/28/15 159 lb (72.122 kg)  02/26/15 162 lb (73.483 kg)         ASSESSMENT AND PLAN:  1. essential hypertension 2. polycythemia rubra vera. She had a phlebotomy about 3 weeks ago. She has a phlebotomy  every 6 weeks if her hemoglobin goes above 6.0.  Dr. Marin Olp . 3. status post cholecystectomy 4. old stroke with history of left carotid stenosis 5. seizure disorder, on Keppra, followed by Dr. Leonie Man 6. history of recurrent sinusitis 7. GERD 8. Past history of frequent PVCs 9. Hypothyroid. Requires brand name Armour thyroid 150 gr daily. Clinically euthyroid.  10. Depression. 11. History of fibromyalgia 12. Memory disorder   Current medicines are reviewed at length with the patient today.  The patient does not have concerns regarding medicines.  The following changes have been made:  no change  Labs/ tests ordered today include:  No orders of the defined types were placed in this encounter.     Disposition:   The patient intends to use Dr. Etter Sjogren for her PCP.  Return to cardiology on an as-needed basis.  Berna Spare MD 05/22/2015 4:54 PM    [address], [place], [place]  [address] Phone: [phone]; Fax: [phone]

## 2015-06-02 ENCOUNTER — Other Ambulatory Visit: Payer: Self-pay | Admitting: *Deleted

## 2015-06-02 DIAGNOSIS — M797 Fibromyalgia: Secondary | ICD-10-CM

## 2015-06-02 DIAGNOSIS — D45 Polycythemia vera: Secondary | ICD-10-CM

## 2015-06-02 DIAGNOSIS — I358 Other nonrheumatic aortic valve disorders: Secondary | ICD-10-CM

## 2015-06-02 MED ORDER — OXYCODONE HCL ER 10 MG PO T12A: 10.0000 mg | EXTENDED_RELEASE_TABLET | Freq: Two times a day (BID) | ORAL | Status: DC

## 2015-06-02 MED ORDER — OXYCODONE HCL 15 MG PO TABS: 15.0000 mg | ORAL_TABLET | Freq: Four times a day (QID) | ORAL | Status: DC | PRN

## 2015-06-02 MED ORDER — OXYCODONE HCL ER 20 MG PO T12A: 20.0000 mg | EXTENDED_RELEASE_TABLET | Freq: Three times a day (TID) | ORAL | Status: DC

## 2015-06-03 ENCOUNTER — Ambulatory Visit (INDEPENDENT_AMBULATORY_CARE_PROVIDER_SITE_OTHER): Payer: Medicare Other | Admitting: Family Medicine

## 2015-06-03 ENCOUNTER — Encounter: Payer: Self-pay | Admitting: Family Medicine

## 2015-06-03 ENCOUNTER — Other Ambulatory Visit: Payer: Self-pay | Admitting: Nurse Practitioner

## 2015-06-03 VITALS — BP 110/56 | HR 66 | Temp 98.0°F | Ht 63.0 in | Wt 165.4 lb

## 2015-06-03 DIAGNOSIS — H5462 Unqualified visual loss, left eye, normal vision right eye: Secondary | ICD-10-CM

## 2015-06-03 DIAGNOSIS — D45 Polycythemia vera: Secondary | ICD-10-CM

## 2015-06-03 DIAGNOSIS — E039 Hypothyroidism, unspecified: Secondary | ICD-10-CM | POA: Diagnosis not present

## 2015-06-03 DIAGNOSIS — Z1239 Encounter for other screening for malignant neoplasm of breast: Secondary | ICD-10-CM

## 2015-06-03 DIAGNOSIS — E032 Hypothyroidism due to medicaments and other exogenous substances: Secondary | ICD-10-CM

## 2015-06-03 DIAGNOSIS — E785 Hyperlipidemia, unspecified: Secondary | ICD-10-CM

## 2015-06-03 DIAGNOSIS — E2839 Other primary ovarian failure: Secondary | ICD-10-CM | POA: Diagnosis not present

## 2015-06-03 DIAGNOSIS — Z23 Encounter for immunization: Secondary | ICD-10-CM

## 2015-06-03 DIAGNOSIS — I358 Other nonrheumatic aortic valve disorders: Secondary | ICD-10-CM

## 2015-06-03 DIAGNOSIS — Z1159 Encounter for screening for other viral diseases: Secondary | ICD-10-CM

## 2015-06-03 MED ORDER — NONFORMULARY OR COMPOUNDED ITEM: Status: DC

## 2015-06-03 NOTE — Patient Instructions (Signed)
Polycythemia Vera Polycythemia Vera is a condition in which the body makes too many red blood cells and there is no known cause. The red blood cells (erythrocytes) are the cells which carry the oxygen in your blood stream to the cells of your body. Because of the increased red blood cells, the blood becomes thicker and does not circulate as well. It would be similar to your car having oil which is too thick so it cannot start and circulate as well. When the blood is too thick it often causes headaches and dizziness. It may also cause blood clots. Even though the blood clots easier, these patients bleed easier. The bleeding is caused because the blood cells which help stop bleeding (platelets) do not function normally. It occurs in all age groups but is more common in the 50 to 70 year age range. TREATMENT  The treatment of polycythemia vera for many years has been blood removal (phlebotomy) which is similar to blood removal in a blood bank, however this blood is not used for donation. Hydroxyurea is used to supplement phlebotomy. Aspirin is commonly given to thin the blood as long as the patient does not have a problem with bleeding. Other drugs are used based on the progression of the disease.   This information is not intended to replace advice given to you by your health care provider. Make sure you discuss any questions you have with your health care provider.   Document Released: 12/08/2000 Document Revised: 04/05/2014 Document Reviewed: 09/25/2014 Elsevier Interactive Patient Education 2016 [employer]  

## 2015-06-03 NOTE — Progress Notes (Signed)
Pre visit review using our clinic review tool, if applicable. No additional management support is needed unless otherwise documented below in the visit note. 

## 2015-06-04 ENCOUNTER — Other Ambulatory Visit: Payer: Self-pay | Admitting: Family Medicine

## 2015-06-04 ENCOUNTER — Other Ambulatory Visit (HOSPITAL_BASED_OUTPATIENT_CLINIC_OR_DEPARTMENT_OTHER): Payer: Medicare Other

## 2015-06-04 DIAGNOSIS — D45 Polycythemia vera: Secondary | ICD-10-CM | POA: Diagnosis present

## 2015-06-04 DIAGNOSIS — C181 Malignant neoplasm of appendix: Secondary | ICD-10-CM | POA: Diagnosis not present

## 2015-06-04 DIAGNOSIS — E032 Hypothyroidism due to medicaments and other exogenous substances: Secondary | ICD-10-CM

## 2015-06-04 DIAGNOSIS — E785 Hyperlipidemia, unspecified: Secondary | ICD-10-CM

## 2015-06-04 DIAGNOSIS — E039 Hypothyroidism, unspecified: Secondary | ICD-10-CM | POA: Diagnosis not present

## 2015-06-04 DIAGNOSIS — Z1231 Encounter for screening mammogram for malignant neoplasm of breast: Secondary | ICD-10-CM

## 2015-06-04 LAB — CBC WITH DIFFERENTIAL (CANCER CENTER ONLY)
BASO#: 0 10*3/uL (ref 0.0–0.2)
BASO%: 0.2 % (ref 0.0–2.0)
EOS ABS: 0.5 10*3/uL (ref 0.0–0.5)
EOS%: 8.4 % — ABNORMAL HIGH (ref 0.0–7.0)
HEMATOCRIT: 21.8 % — AB (ref 34.8–46.6)
HEMOGLOBIN: 5.5 g/dL — AB (ref 11.6–15.9)
LYMPH#: 0.7 10*3/uL — ABNORMAL LOW (ref 0.9–3.3)
LYMPH%: 12.3 % — ABNORMAL LOW (ref 14.0–48.0)
MCH: 17.8 pg — AB (ref 26.0–34.0)
MCHC: 25.2 g/dL — AB (ref 32.0–36.0)
MCV: 71 fL — ABNORMAL LOW (ref 81–101)
MONO#: 0.8 10*3/uL (ref 0.1–0.9)
MONO%: 13.9 % — AB (ref 0.0–13.0)
NEUT%: 65.2 % (ref 39.6–80.0)
NEUTROS ABS: 3.7 10*3/uL (ref 1.5–6.5)
Platelets: 154 10*3/uL (ref 145–400)
RBC: 3.09 10*6/uL — ABNORMAL LOW (ref 3.70–5.32)
RDW: 20.7 % — AB (ref 11.1–15.7)
WBC: 5.7 10*3/uL (ref 3.9–10.0)

## 2015-06-04 LAB — COMPREHENSIVE METABOLIC PANEL
ALBUMIN: 3.6 g/dL (ref 3.5–5.0)
ALK PHOS: 122 U/L (ref 40–150)
ALT: <9 U/L (ref 0–55)
AST: 14 U/L (ref 5–34)
Anion Gap: 9 mEq/L (ref 3–11)
BUN: 9.3 mg/dL (ref 7.0–26.0)
CALCIUM: 8.8 mg/dL (ref 8.4–10.4)
CHLORIDE: 101 meq/L (ref 98–109)
CO2: 30 mEq/L — ABNORMAL HIGH (ref 22–29)
Creatinine: 0.8 mg/dL (ref 0.6–1.1)
EGFR: 75 mL/min/{1.73_m2} — AB (ref >90)
Glucose: 106 mg/dl (ref 70–140)
POTASSIUM: 3.5 meq/L (ref 3.5–5.1)
SODIUM: 139 meq/L (ref 136–145)
Total Bilirubin: 0.74 mg/dL (ref 0.20–1.20)
Total Protein: 7.1 g/dL (ref 6.4–8.3)

## 2015-06-04 NOTE — Assessment & Plan Note (Signed)
Per cardiology 

## 2015-06-04 NOTE — Assessment & Plan Note (Signed)
Per hematology 

## 2015-06-04 NOTE — Assessment & Plan Note (Signed)
con't armour thyroid

## 2015-06-04 NOTE — Progress Notes (Signed)
Patient ID: [name], female    DOB: 10/07/1945  Age: 70 y.o. MRN: [id number]    Subjective:  Subjective HPI [name] presents for f/u and to establish.  Dr [name] has retired.  Review of Systems  Constitutional: Negative for diaphoresis, appetite change, fatigue and unexpected weight change.  Eyes: Negative for pain, redness and visual disturbance.  Respiratory: Negative for cough, chest tightness, shortness of breath and wheezing.   Cardiovascular: Negative for chest pain, palpitations and leg swelling.  Endocrine: Negative for cold intolerance, heat intolerance, polydipsia, polyphagia and polyuria.  Genitourinary: Negative for dysuria, frequency and difficulty urinating.  Neurological: Negative for dizziness, light-headedness, numbness and headaches.    History Past Medical History  Diagnosis Date  . Fibromyalgia     chronic pain syndrome  . Postmenopausal state     on hormone replacement therapy  . Aortic valve sclerosis     by echocardiogram 12/04/2009  . Hyperlipidemia     "not since carotid OR" (12/28/2012)  . 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 GERD (gastroesophageal reflux disease)   . PAC (premature atrial contraction)   . Chronic constipation     takes Mineral Oil,Juice,Enema(prn),and Miralax(Prn) and Cascara nightly  . Insomnia     takes restoril nightly and Xanax  . Hypothyroidism   . Seasonal allergies     takes Allegra in am and Benadryl at night  . Occlusion and stenosis of carotid artery without mention of cerebral infarction 10/26/2012  . Complex partial seizure disorder ([name]) 10/15/2012  . Acute cholecystitis 10/07/2012    s/p pec drain due to recent CVA, pending chole 11/2012  . Difficult intubation     grade 3 airway  . PONV (postoperative nausea and vomiting)     "I vomit for 5 days straight w/certain RX w/thanes" (12/28/2012)  . Hypertension     "not since carotid OR" (12/28/2012)  . Heart  murmur     "flow mumur" (12/28/2012)  . Exertional dyspnea   . Asthma     "wheeze occasionally; I don't have asthma" (12/28/2012)  . Anemia     "due to polycythemia vera" (12/28/2012)  . History of blood transfusion 09/2012; 12/28/2012  . Seizures ([name])     only seizure was 10/14/12 "after carotid endarterectomy";takes Keppra daily  . Stroke [name]) 09/2012    "after they put drain in my gallbladder"; residual is "haven't felt well enough to tell since stroke to tell; weak already; weaker when I'm tired" (12/28/2012)  . Chronic back pain   . Anxiety   . 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 Melanoma of chin ([name]) 2001    Right chin    She has past surgical history that includes Endarterectomy (Left, 10/10/2012); Carotid endarterectomy (Left, 10-10-12); Abdominal hysterectomy (1996); Tonsillectomy; Nasal septum surgery; Colonoscopy; insertion of drain (10/02/12); PICC line placed; Tubal ligation (~ 1972); Esophagogastroduodenoscopy (N/A, 01/02/2013); Cholecystectomy (N/A, 11/29/2012); Cholecystectomy (N/A, 11/29/2012); and Colonoscopy (N/A, 05/14/2013).   Her family history includes Coronary artery disease in her mother; Heart attack in her father and mother; Heart disease in her father and mother; Hyperlipidemia in her sister; Hypertension in her sister.She reports that she has never smoked. She has never used smokeless tobacco. She reports that she does not drink alcohol or use illicit drugs.  Current Outpatient Prescriptions on File Prior to Visit  Medication Sig Dispense Refill  . ALPRAZolam (XANAX) 0.5 MG tablet Take 0.5 mg by  mouth every 6 (six) hours as needed for anxiety.    Marland Kitchen aspirin 325 MG tablet Take 325 mg by mouth daily after breakfast. "to thin  Blood"    . bisoprolol (ZEBETA) 10 MG tablet TAKE 1 TABLET BY MOUTH EVERY DAY 90 tablet 3  . Cascara [place] 450 MG CAPS Take 900 mg by mouth at bedtime.    . cephALEXin (KEFLEX) 500 MG capsule Take 500 mg by mouth as needed (deep  chest congestion).   2  . ciprofloxacin (CIPRO) 500 MG tablet TAKE 1 TABLET BY MOUTH TWICE A DAY FOR 14 DAYS 28 tablet 0  . clopidogrel (PLAVIX) 75 MG tablet TAKE 1 TABLET (75 MG TOTAL) BY MOUTH DAILY. 30 tablet 3  . cyanocobalamin (,VITAMIN B-12,) 1000 MCG/ML injection Inject 1,000 mcg into the muscle as needed (for energy).    . cyclobenzaprine (FLEXERIL) 10 MG tablet Take 10 mg by mouth every 6 (six) hours as needed for muscle spasms.    . diphenhydrAMINE (BENADRYL) 25 MG tablet Take 50 mg by mouth every 8 (eight) hours as needed. Patient may take 50 mg three times a day as needed for allergies    . docusate sodium (COLACE) 100 MG capsule Take 100 mg by mouth 3 (three) times daily with meals.     . famotidine (PEPCID) 20 MG tablet TAKE 1 TABLET BY MOUTH TWICE A DAY BEFORE A MEAL *4/29* 60 tablet 0  . fexofenadine (ALLEGRA) 180 MG tablet Take 180 mg by mouth daily after breakfast.     . furosemide (LASIX) 40 MG tablet Take 1 tablet (40 mg total) by mouth 2 (two) times daily. 60 tablet 11  . guaiFENesin (MUCINEX) 600 MG 12 hr tablet Take 600 mg by mouth 2 (two) times daily as needed for cough.    . hydrochlorothiazide (MICROZIDE) 12.5 MG capsule TAKE ONE CAPSULE BY MOUTH EVERY DAY AS NEEDED IF SYSTOLIC IS GREATER THAN [id number] 30 capsule 10  . Ipratropium-Albuterol (COMBIVENT RESPIMAT) 20-100 MCG/ACT AERS respimat Inhale 2 puffs into the lungs 4 (four) times daily.    Marland Kitchen lidocaine-prilocaine (EMLA) cream Apply 1 application topically as needed (to numb the phlebotomy site). 30 g 6  . magnesium oxide (MAG-OX) 400 MG tablet Take 1,200 mg by mouth daily.    . Methylsulfonylmethane (MSM) 1000 MG CAPS Take 3 capsules by mouth daily. With breakfast    . mineral oil liquid Take 60 mLs by mouth at bedtime. With Juice    . Misc Natural Products (TART CHERRY ADVANCED PO) Take 1,200 mg by mouth 2 (two) times daily. Reported on 06/03/2015    . nitroGLYCERIN (NITROSTAT) 0.4 MG SL tablet Place 0.4 mg under the tongue  every 5 (five) minutes as needed for chest pain.    Marland Kitchen ondansetron (ZOFRAN) 8 MG tablet Take 8 mg by mouth every 8 (eight) hours as needed for nausea or vomiting.     . ondansetron (ZOFRAN-ODT) 4 MG disintegrating tablet Take 4 mg by mouth every 8 (eight) hours as needed for nausea or vomiting (nausea).    Marland Kitchen oxyCODONE (OXYCONTIN) 10 mg 12 hr tablet Take 1 tablet (10 mg total) by mouth 2 (two) times daily. Take one tablet by mouth at 8am and one tablet by mouth at 4pm 60 tablet 0  . oxyCODONE (OXYCONTIN) 20 mg 12 hr tablet Take 1 tablet (20 mg total) by mouth 3 (three) times daily. Take 1 tablet by mouth at 12noon, 8pm and 4am 90 tablet 0  . oxyCODONE (ROXICODONE) 15  MG immediate release tablet Take 1 tablet (15 mg total) by mouth every 6 (six) hours as needed for pain. 30 tablet 0  . potassium chloride (MICRO-K) 10 MEQ CR capsule Take 1 tablet by mouth eight times daily 240 capsule 5  . promethazine (PHENERGAN) 25 MG tablet Take 25 mg by mouth every 6 (six) hours as needed for nausea.    . Sennosides (SENNA LAX PO) Take 1 tablet by mouth 3 (three) times daily after meals.     . temazepam (RESTORIL) 30 MG capsule Take 30 mg by mouth at bedtime as needed for sleep.    Marland Kitchen thyroid (ARMOUR THYROID) 60 MG tablet Take 2.5 tablets (150 mg total) by mouth daily. 75 tablet 5  . triamcinolone cream (KENALOG) 0.1 % Apply 1 application topically 2 (two) times daily as needed (to skin).    . Valerian 100 MG CAPS Take 100 mg by mouth at bedtime as needed. Take for sleep    . Vitamin D, Cholecalciferol, 1000 UNITS TABS Take 4,000 Units by mouth every morning.    . Garlic Oil 123XX123 MG CAPS Take 1,000 mg by mouth daily after breakfast. Reported on 06/03/2015    . lisinopril (PRINIVIL,ZESTRIL) 10 MG tablet Take 10 mg by mouth 2 (two) times daily as needed. Reported on 06/03/2015     No current facility-administered medications on file prior to visit.     Objective:  Objective Physical Exam  Constitutional: She is  oriented to person, place, and time. She appears well-developed and well-nourished.  HENT:  Head: Normocephalic and atraumatic.  Eyes: Conjunctivae and EOM are normal.  Neck: Normal range of motion. Neck supple. No JVD present. Carotid bruit is not present. No thyromegaly present.  Cardiovascular: Normal rate, regular rhythm and normal heart sounds.   No murmur heard. Pulmonary/Chest: Effort normal and breath sounds normal. No respiratory distress. She has no wheezes. She has no rales. She exhibits no tenderness.  Musculoskeletal: She exhibits no edema.  Neurological: She is alert and oriented to person, place, and time.  Psychiatric: She has a normal mood and affect. Her behavior is normal. Judgment and thought content normal.  Nursing note and vitals reviewed.  BP 110/56 mmHg  Pulse 66  Temp(Src) 98 F (36.7 C) (Oral)  Ht 5\' 3"  (1.6 m)  Wt 165 lb 6.4 oz (75.025 kg)  BMI 29.31 kg/m2  SpO2 96% Wt Readings from Last 3 Encounters:  06/03/15 165 lb 6.4 oz (75.025 kg)  05/22/15 163 lb (73.936 kg)  04/28/15 159 lb (72.122 kg)     Lab Results  Component Value Date   WBC 5.7 06/04/2015   HGB 5.5* 06/04/2015   HCT 21.8* 06/04/2015   PLT 154 06/04/2015   GLUCOSE 106 06/04/2015   CHOL 58 10/07/2012   TRIG 121 10/07/2012   HDL 11* 10/07/2012   LDLCALC 23 10/07/2012   ALT <9 06/04/2015   AST 14 06/04/2015   NA 139 06/04/2015   K 3.5 06/04/2015   CL 95* 12/04/2014   CREATININE 0.8 06/04/2015   BUN 9.3 06/04/2015   CO2 30* 06/04/2015   TSH <0.080* 12/31/2014   INR 1.15 11/21/2012   HGBA1C 5.5 10/07/2012    Mr Brain W Wo Contrast  01/11/2015  CLINICAL DATA:  Basilar artery thrombosis. Polycythemia vera. Recurrent TIA. Question atrophy. Prior carotid endarterectomy. Headaches. EXAM: MRI HEAD WITHOUT AND WITH CONTRAST TECHNIQUE: Multiplanar, multiecho pulse sequences of the brain and surrounding structures were obtained without and with intravenous contrast. CONTRAST:  15  mL  MultiHance IV COMPARISON:  CT head 12/03/2012 FINDINGS: Negative for acute infarct. Cerebral volume and ventricles are appropriate for the patient's age. Chronic infarct in the left frontal white matter. This may represent watershed infarct. Chronic infarct in the head of caudate on the right which is unchanged. Brainstem and cerebellum intact. Negative for intracranial hemorrhage.  Negative for mass or edema. Postcontrast imaging reveals a small punctate area of enhancement in the left frontal lobe adjacent to an area of chronic infarction. This is only seen well on the coronal postcontrast images. This could represent a subacute infarct but does not show restricted diffusion. Given its location adjacent to an area of chronic infarction, metastatic disease not considered likely. IMPRESSION: Negative for acute infarct. Age-appropriate cerebral volume loss Chronic infarct in the left frontal white matter, probable watershed infarct. Punctate area of enhancement in the left frontal lobe adjacent inter chronic infarct. This may be an area of subacute infarction Electronically Signed   By: Franchot Gallo M.D.   On: 01/11/2015 14:46     Assessment & Plan:  Plan I have changed [name]'s NONFORMULARY OR COMPOUNDED ITEM. I am also having her maintain her fexofenadine, aspirin, diphenhydrAMINE, docusate sodium, mineral oil, Cascara [place], cyanocobalamin, Garlic Oil, magnesium oxide, promethazine, Valerian, lisinopril, triamcinolone cream, nitroGLYCERIN, Sennosides (SENNA LAX PO), ondansetron, Vitamin D (Cholecalciferol), MSM, lidocaine-prilocaine, furosemide, Misc Natural Products (TART CHERRY ADVANCED PO), cephALEXin, Ipratropium-Albuterol, ondansetron, guaiFENesin, thyroid, bisoprolol, hydrochlorothiazide, clopidogrel, ciprofloxacin, famotidine, ALPRAZolam, cyclobenzaprine, temazepam, potassium chloride, oxyCODONE, oxyCODONE, oxyCODONE, and levETIRAcetam.  Meds ordered this encounter  Medications  .  levETIRAcetam (KEPPRA) 250 MG tablet    Sig: Take 250 mg by mouth 2 (two) times daily.  Marland Kitchen DISCONTD: NONFORMULARY OR COMPOUNDED ITEM    Sig: TSH, free T3, free t4, lipid cmp    Dx hyperlipidemia and hypothyroidism    Dispense:  1 each    Refill:  0  . NONFORMULARY OR COMPOUNDED ITEM    Sig: TSH, free T3, free t4, lipid cmp    Dx hyperlipidemia and hypothyroidism Hep c antibody  Dx need for hep c    Dispense:  1 each    Refill:  0    Problem List Items Addressed This Visit    Aortic valve sclerosis    Per cardiology      Hypothyroidism - Primary    con't armour thyroid      Relevant Medications   NONFORMULARY OR COMPOUNDED ITEM   Polycythemia vera ([place])    Per hematology      Relevant Medications   levETIRAcetam (KEPPRA) 250 MG tablet   Vision loss of left eye    Other Visit Diagnoses    Hyperlipidemia        Relevant Medications    NONFORMULARY OR COMPOUNDED ITEM    Estrogen deficiency        Relevant Orders    DG Bone Density    Breast cancer screening        Need for hepatitis C screening test        Relevant Orders    Hepatitis C antibody    Need for pneumococcal vaccination        Relevant Orders    Pneumococcal conjugate vaccine 13-valent (Completed)       Follow-up: Return in about 6 months (around 12/04/2015), or if symptoms worsen or fail to improve, for annual exam, fasting.  Garnet Koyanagi, DO

## 2015-06-05 LAB — TSH CHCC: TSH: 0.018 u[IU]/mL — AB (ref 0.450–4.500)

## 2015-06-05 LAB — LIPID PANEL
CHOLESTEROL TOTAL: 76 mg/dL — AB (ref 100–199)
Chol/HDL Ratio: 2.8 ratio units (ref 0.0–4.4)
HDL: 27 mg/dL — ABNORMAL LOW (ref >39)
LDL CALC: 34 mg/dL (ref 0–99)
Triglycerides: 73 mg/dL (ref 0–149)
VLDL Cholesterol Cal: 15 mg/dL (ref 5–40)

## 2015-06-05 LAB — HEPATITIS C ANTIBODY: Hep C Virus Ab: 0.2 s/co ratio (ref 0.0–0.9)

## 2015-06-05 LAB — T4, FREE: FREE T4: 0.97 ng/dL (ref 0.82–1.77)

## 2015-06-05 LAB — T3, FREE: T3 FREE: 5 pg/mL — AB (ref 2.0–4.4)

## 2015-06-10 DIAGNOSIS — L812 Freckles: Secondary | ICD-10-CM | POA: Diagnosis not present

## 2015-06-10 DIAGNOSIS — L308 Other specified dermatitis: Secondary | ICD-10-CM | POA: Diagnosis not present

## 2015-06-10 DIAGNOSIS — L821 Other seborrheic keratosis: Secondary | ICD-10-CM | POA: Diagnosis not present

## 2015-06-10 DIAGNOSIS — D1801 Hemangioma of skin and subcutaneous tissue: Secondary | ICD-10-CM | POA: Diagnosis not present

## 2015-06-10 DIAGNOSIS — Z85820 Personal history of malignant melanoma of skin: Secondary | ICD-10-CM | POA: Diagnosis not present

## 2015-06-13 ENCOUNTER — Telehealth: Payer: Self-pay | Admitting: Family Medicine

## 2015-06-13 NOTE — Telephone Encounter (Signed)
Please review and advise     KP 

## 2015-06-13 NOTE — Telephone Encounter (Signed)
She is taking 150 mg daily.     KP

## 2015-06-13 NOTE — Telephone Encounter (Signed)
Caller name:Bodiford,David Relation to [id number]  Call back number: [phone]  Pharmacy: [id number] - [place], [place] [phone] (Phone) [phone] (Fax)         Reason for call:  Patient requesting a refill  thyroid (ARMOUR THYROID) 60 MG tablet cant break the tablets requesting 2 60 MG and 1 30 MG tablet and requesting a refill of famotidine (PEPCID) 20 MG tablet re

## 2015-06-13 NOTE — Telephone Encounter (Signed)
Husband stated if Dr.Lowne was changing medicine then his wife needs to be seen. Apt scheduled for Monday.     KP

## 2015-06-13 NOTE — Telephone Encounter (Signed)
Dec 150 mcg----75x2

## 2015-06-13 NOTE — Telephone Encounter (Signed)
Hyperthyroid----- we need to decrease the armour to 30 mg daily Recheck 2 months----ths

## 2015-06-16 ENCOUNTER — Telehealth: Payer: Self-pay | Admitting: Family Medicine

## 2015-06-16 ENCOUNTER — Ambulatory Visit (INDEPENDENT_AMBULATORY_CARE_PROVIDER_SITE_OTHER): Payer: Medicare Other | Admitting: Family Medicine

## 2015-06-16 ENCOUNTER — Encounter: Payer: Self-pay | Admitting: Family Medicine

## 2015-06-16 VITALS — BP 116/72 | HR 73 | Temp 98.3°F | Ht 63.0 in | Wt 163.0 lb

## 2015-06-16 DIAGNOSIS — K219 Gastro-esophageal reflux disease without esophagitis: Secondary | ICD-10-CM

## 2015-06-16 DIAGNOSIS — E039 Hypothyroidism, unspecified: Secondary | ICD-10-CM | POA: Diagnosis not present

## 2015-06-16 MED ORDER — THYROID 120 MG PO TABS: 120.0000 mg | ORAL_TABLET | Freq: Every day | ORAL | Status: DC

## 2015-06-16 MED ORDER — FAMOTIDINE 20 MG PO TABS
ORAL_TABLET | ORAL | Status: DC
Start: 2015-06-16 — End: 2016-06-24

## 2015-06-16 MED ORDER — THYROID 30 MG PO TABS: 30.0000 mg | ORAL_TABLET | Freq: Every day | ORAL | Status: DC

## 2015-06-16 NOTE — Patient Instructions (Signed)

## 2015-06-16 NOTE — Progress Notes (Signed)
Patient ID: [name], female    DOB: 09/05/1945  Age: 70 y.o. MRN: [id number]    Subjective:  Subjective HPI [name] presents to discuss thyroid.  Her TSh , free t3, free t4 always are slightly abnormal and endo ---  [name]  Review of Systems  Constitutional: Negative for diaphoresis, appetite change, fatigue and unexpected weight change.  Eyes: Negative for pain, redness and visual disturbance.  Respiratory: Negative for cough, chest tightness, shortness of breath and wheezing.   Cardiovascular: Negative for chest pain, palpitations and leg swelling.  Endocrine: Negative for cold intolerance, heat intolerance, polydipsia, polyphagia and polyuria.  Genitourinary: Negative for dysuria, frequency and difficulty urinating.  Neurological: Negative for dizziness, light-headedness, numbness and headaches.    History Past Medical History  Diagnosis Date  . Fibromyalgia     chronic pain syndrome  . Postmenopausal state     on hormone replacement therapy  . Aortic valve sclerosis     by echocardiogram 12/04/2009  . Hyperlipidemia     "not since carotid OR" (12/28/2012)  . 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 GERD (gastroesophageal reflux disease)   . PAC (premature atrial contraction)   . Chronic constipation     takes Mineral Oil,Juice,Enema(prn),and Miralax(Prn) and Cascara nightly  . Insomnia     takes restoril nightly and Xanax  . Hypothyroidism   . Seasonal allergies     takes Allegra in am and Benadryl at night  . Occlusion and stenosis of carotid artery without mention of cerebral infarction 10/26/2012  . 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 Acute cholecystitis 10/07/2012    s/p pec drain due to recent CVA, pending chole 11/2012  . Difficult intubation     grade 3 airway  . PONV (postoperative nausea and vomiting)     "I vomit for 5 days straight w/certain RX w/thanes" (12/28/2012)  . Hypertension       "not since carotid OR" (12/28/2012)  . Heart murmur     "flow mumur" (12/28/2012)  . Exertional dyspnea   . Asthma     "wheeze occasionally; I don't have asthma" (12/28/2012)  . Anemia     "due to polycythemia vera" (12/28/2012)  . History of blood transfusion 09/2012; 12/28/2012  . Seizures ([place])     only seizure was 10/14/12 "after carotid endarterectomy";takes Keppra daily  . Stroke [facility]) 09/2012    "after they put drain in my gallbladder"; residual is "haven't felt well enough to tell since stroke to tell; weak already; weaker when I'm tired" (12/28/2012)  . Chronic back pain   . Anxiety   . 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   Her family history includes Coronary artery disease in her mother; Heart attack in her father and mother; Heart disease in her father and mother; Hyperlipidemia in her sister; Hypertension in her sister.She reports that she has never smoked. She has never used smokeless tobacco. She reports that she does not drink alcohol or use illicit drugs.  Current Outpatient Prescriptions on File Prior to Visit  Medication Sig Dispense  Refill  . ALPRAZolam (XANAX) 0.5 MG tablet Take 0.5 mg by mouth every 6 (six) hours as needed for anxiety.    Marland Kitchen aspirin 325 MG tablet Take 325 mg by mouth daily after breakfast. "to thin  Blood"    . bisoprolol (ZEBETA) 10 MG tablet TAKE 1 TABLET BY MOUTH EVERY DAY 90 tablet 3  . Cascara [place] 450 MG CAPS Take 900 mg by mouth at bedtime.    . cephALEXin (KEFLEX) 500 MG  capsule Take 500 mg by mouth as needed (deep chest congestion).   2  . clopidogrel (PLAVIX) 75 MG tablet TAKE 1 TABLET (75 MG TOTAL) BY MOUTH DAILY. 30 tablet 3  . cyclobenzaprine (FLEXERIL) 10 MG tablet Take 10 mg by mouth every 6 (six) hours as needed for muscle spasms.    . diphenhydrAMINE (BENADRYL) 25 MG tablet Take 50 mg by mouth every 8 (eight) hours as needed. Patient may take 50 mg three times a day as needed for allergies    . docusate sodium (COLACE) 100 MG capsule Take 100 mg by mouth 3 (three) times daily with meals.     . fexofenadine (ALLEGRA) 180 MG tablet Take 180 mg by mouth daily after breakfast.     . furosemide (LASIX) 40 MG tablet Take 1 tablet (40 mg total) by mouth 2 (two) times daily. 60 tablet 11  . Garlic Oil 123XX123 MG CAPS Take 1,000 mg by mouth daily after breakfast. Reported on 06/03/2015    . guaiFENesin (MUCINEX) 600 MG 12 hr tablet Take 600 mg by mouth 2 (two) times daily as needed for cough.    . hydrochlorothiazide (MICROZIDE) 12.5 MG capsule TAKE ONE CAPSULE BY MOUTH EVERY DAY AS NEEDED IF SYSTOLIC IS GREATER THAN [id number] 30 capsule 10  . Ipratropium-Albuterol (COMBIVENT RESPIMAT) 20-100 MCG/ACT AERS respimat Inhale 2 puffs into the lungs 4 (four) times daily.    Marland Kitchen levETIRAcetam (KEPPRA) 250 MG tablet Take 250 mg by mouth 2 (two) times daily.    Marland Kitchen lidocaine-prilocaine (EMLA) cream Apply 1 application topically as needed (to numb the phlebotomy site). 30 g 6  . lisinopril (PRINIVIL,ZESTRIL) 10 MG tablet Take 10 mg by mouth 2 (two) times daily as needed. Reported on 06/03/2015    . magnesium oxide (MAG-OX) 400 MG tablet Take 1,200 mg by mouth daily.    . Methylsulfonylmethane (MSM) 1000 MG CAPS Take 3 capsules by mouth daily. With breakfast    . mineral oil liquid Take 60 mLs by mouth at bedtime. With Juice    . Misc Natural Products (TART CHERRY ADVANCED PO) Take 1,200 mg by mouth 2 (two) times daily. Reported on 06/03/2015    . nitroGLYCERIN (NITROSTAT) 0.4 MG SL tablet  Place 0.4 mg under the tongue every 5 (five) minutes as needed for chest pain.    . NONFORMULARY OR COMPOUNDED ITEM TSH, free T3, free t4, lipid cmp    Dx hyperlipidemia and hypothyroidism Hep c antibody  Dx need for hep c 1 each 0  . ondansetron (ZOFRAN) 8 MG tablet Take 8 mg by mouth every 8 (eight) hours as needed for nausea or vomiting.     . ondansetron (ZOFRAN-ODT) 4 MG disintegrating tablet Take 4 mg by mouth every 8 (eight) hours as needed for nausea or vomiting (nausea).    Marland Kitchen oxyCODONE (OXYCONTIN) 10 mg 12 hr tablet Take 1 tablet (10 mg total) by mouth 2 (two) times daily. Take one tablet by mouth at 8am and one tablet by mouth at 4pm  60 tablet 0  . oxyCODONE (OXYCONTIN) 20 mg 12 hr tablet Take 1 tablet (20 mg total) by mouth 3 (three) times daily. Take 1 tablet by mouth at 12noon, 8pm and 4am 90 tablet 0  . oxyCODONE (ROXICODONE) 15 MG immediate release tablet Take 1 tablet (15 mg total) by mouth every 6 (six) hours as needed for pain. 30 tablet 0  . potassium chloride (MICRO-K) 10 MEQ CR capsule Take 1 tablet by mouth eight times daily 240 capsule 5  . promethazine (PHENERGAN) 25 MG tablet Take 25 mg by mouth every 6 (six) hours as needed for nausea.    . Sennosides (SENNA LAX PO) Take 1 tablet by mouth 3 (three) times daily after meals.     . temazepam (RESTORIL) 30 MG capsule Take 30 mg by mouth at bedtime as needed for sleep.    Marland Kitchen thyroid (ARMOUR THYROID) 60 MG tablet Take 2.5 tablets (150 mg total) by mouth daily. 75 tablet 5  . triamcinolone cream (KENALOG) 0.1 % Apply 1 application topically 2 (two) times daily as needed (to skin).    . Valerian 100 MG CAPS Take 100 mg by mouth at bedtime as needed. Take for sleep    . Vitamin D, Cholecalciferol, 1000 UNITS TABS Take 4,000 Units by mouth every morning.     No current facility-administered medications on file prior to visit.     Objective:  Objective Physical Exam  Constitutional: She is oriented to person, place, and time. She  appears well-developed and well-nourished.  HENT:  Head: Normocephalic and atraumatic.  Eyes: Conjunctivae and EOM are normal.  Neck: Normal range of motion. Neck supple. No JVD present. Carotid bruit is not present. No thyromegaly present.  Cardiovascular: Normal rate, regular rhythm and normal heart sounds.   No murmur heard. Pulmonary/Chest: Effort normal and breath sounds normal. No respiratory distress. She has no wheezes. She has no rales. She exhibits no tenderness.  Musculoskeletal: She exhibits no edema.  Neurological: She is alert and oriented to person, place, and time.  Psychiatric: She has a normal mood and affect.  Nursing note and vitals reviewed.  BP 116/72 mmHg  Pulse 73  Temp(Src) 98.3 F (36.8 C) (Oral)  Ht 5\' 3"  (1.6 m)  Wt 163 lb (73.936 kg)  BMI 28.88 kg/m2  SpO2 98% Wt Readings from Last 3 Encounters:  06/18/15 159 lb (72.122 kg)  06/16/15 163 lb (73.936 kg)  06/03/15 165 lb 6.4 oz (75.025 kg)     Lab Results  Component Value Date   WBC 5.7 06/18/2015   HGB 6.0* 06/18/2015   HCT 23.5* 06/18/2015   PLT 188 06/18/2015   GLUCOSE 117 06/18/2015   CHOL 76* 06/04/2015   TRIG 73 06/04/2015   HDL 27* 06/04/2015   LDLCALC 34 06/04/2015   ALT <9 06/18/2015   AST 13 06/18/2015   NA 141 06/18/2015   K 3.1* 06/18/2015   CL 95* 12/04/2014   CREATININE 0.9 06/18/2015   BUN 14.0 06/18/2015   CO2 31* 06/18/2015   TSH <0.080* 12/31/2014   INR 1.15 11/21/2012   HGBA1C 5.5 10/07/2012    Mr Brain W Wo Contrast  01/11/2015  CLINICAL DATA:  Basilar artery thrombosis. Polycythemia vera. Recurrent TIA. Question atrophy. Prior carotid endarterectomy. Headaches. EXAM: MRI HEAD WITHOUT AND WITH CONTRAST TECHNIQUE: Multiplanar, multiecho pulse sequences of the brain and surrounding structures were obtained without and with intravenous contrast. CONTRAST:  15 mL MultiHance IV COMPARISON:  CT head 12/03/2012 FINDINGS: Negative for acute  infarct. Cerebral volume and  ventricles are appropriate for the patient's age. Chronic infarct in the left frontal white matter. This may represent watershed infarct. Chronic infarct in the head of caudate on the right which is unchanged. Brainstem and cerebellum intact. Negative for intracranial hemorrhage.  Negative for mass or edema. Postcontrast imaging reveals a small punctate area of enhancement in the left frontal lobe adjacent to an area of chronic infarction. This is only seen well on the coronal postcontrast images. This could represent a subacute infarct but does not show restricted diffusion. Given its location adjacent to an area of chronic infarction, metastatic disease not considered likely. IMPRESSION: Negative for acute infarct. Age-appropriate cerebral volume loss Chronic infarct in the left frontal white matter, probable watershed infarct. Punctate area of enhancement in the left frontal lobe adjacent inter chronic infarct. This may be an area of subacute infarction Electronically Signed   By: Franchot Gallo M.D.   On: 01/11/2015 14:46     Assessment & Plan:  Plan I am having [name] start on thyroid and thyroid. I am also having her maintain her fexofenadine, aspirin, diphenhydrAMINE, docusate sodium, mineral oil, Cascara [place], Garlic Oil, magnesium oxide, promethazine, Valerian, lisinopril, triamcinolone cream, nitroGLYCERIN, Sennosides (SENNA LAX PO), ondansetron, Vitamin D (Cholecalciferol), MSM, lidocaine-prilocaine, furosemide, Misc Natural Products (TART CHERRY ADVANCED PO), cephALEXin, Ipratropium-Albuterol, ondansetron, guaiFENesin, thyroid, bisoprolol, hydrochlorothiazide, clopidogrel, ALPRAZolam, cyclobenzaprine, temazepam, potassium chloride, oxyCODONE, oxyCODONE, oxyCODONE, levETIRAcetam, NONFORMULARY OR COMPOUNDED ITEM, and famotidine.  Meds ordered this encounter  Medications  . thyroid (ARMOUR THYROID) 120 MG tablet    Sig: Take 1 tablet (120 mg total) by mouth daily before breakfast.     Dispense:  90 tablet    Refill:  3  . thyroid (ARMOUR THYROID) 30 MG tablet    Sig: Take 1 tablet (30 mg total) by mouth daily before breakfast.    Dispense:  90 tablet    Refill:  3  . famotidine (PEPCID) 20 MG tablet    Sig: TAKE 1 TABLET BY MOUTH TWICE A DAY BEFORE A MEAL *4/29*    Dispense:  180 tablet    Refill:  3    WE NEED REFILLS. THANKS [facility]    Problem List Items Addressed This Visit    None    Visit Diagnoses    Hypothyroidism, unspecified hypothyroidism type    -  Primary    Relevant Medications    thyroid (ARMOUR THYROID) 120 MG tablet    thyroid (ARMOUR THYROID) 30 MG tablet    Gastroesophageal reflux disease, esophagitis presence not specified        Relevant Medications    famotidine (PEPCID) 20 MG tablet       Follow-up: Return if symptoms worsen or fail to improve.  Garnet Koyanagi, DO

## 2015-06-16 NOTE — Telephone Encounter (Signed)
Faxed requesting pt's medical records from Dr. [name] (Endo)   Fax: [phone]

## 2015-06-16 NOTE — Progress Notes (Signed)
Pre visit review using our clinic review tool, if applicable. No additional management support is needed unless otherwise documented below in the visit note. 

## 2015-06-17 ENCOUNTER — Ambulatory Visit (HOSPITAL_BASED_OUTPATIENT_CLINIC_OR_DEPARTMENT_OTHER)
Admission: RE | Admit: 2015-06-17 | Discharge: 2015-06-17 | Disposition: A | Discharge status: Home / Self Care | Payer: Medicare Other | Source: Ambulatory Visit | Attending: Family Medicine | Admitting: Family Medicine

## 2015-06-17 ENCOUNTER — Other Ambulatory Visit: Payer: Self-pay | Admitting: Family Medicine

## 2015-06-17 DIAGNOSIS — Z1231 Encounter for screening mammogram for malignant neoplasm of breast: Secondary | ICD-10-CM

## 2015-06-17 DIAGNOSIS — E2839 Other primary ovarian failure: Secondary | ICD-10-CM

## 2015-06-18 ENCOUNTER — Encounter: Payer: Self-pay | Admitting: Hematology & Oncology

## 2015-06-18 ENCOUNTER — Other Ambulatory Visit (HOSPITAL_BASED_OUTPATIENT_CLINIC_OR_DEPARTMENT_OTHER): Payer: Medicare Other

## 2015-06-18 ENCOUNTER — Telehealth: Payer: Self-pay | Admitting: *Deleted

## 2015-06-18 ENCOUNTER — Ambulatory Visit (HOSPITAL_BASED_OUTPATIENT_CLINIC_OR_DEPARTMENT_OTHER): Payer: Medicare Other | Admitting: Hematology & Oncology

## 2015-06-18 VITALS — BP 131/37 | HR 79 | Temp 97.7°F | Resp 16 | Ht 63.0 in | Wt 159.0 lb

## 2015-06-18 DIAGNOSIS — Z8673 Personal history of transient ischemic attack (TIA), and cerebral infarction without residual deficits: Secondary | ICD-10-CM

## 2015-06-18 DIAGNOSIS — D45 Polycythemia vera: Secondary | ICD-10-CM | POA: Diagnosis not present

## 2015-06-18 DIAGNOSIS — N390 Urinary tract infection, site not specified: Secondary | ICD-10-CM

## 2015-06-18 LAB — COMPREHENSIVE METABOLIC PANEL
ALBUMIN: 3.7 g/dL (ref 3.5–5.0)
ALK PHOS: 126 U/L (ref 40–150)
ALT: <9 U/L (ref 0–55)
ANION GAP: 8 meq/L (ref 3–11)
AST: 13 U/L (ref 5–34)
BUN: 14 mg/dL (ref 7.0–26.0)
CALCIUM: 9 mg/dL (ref 8.4–10.4)
CHLORIDE: 102 meq/L (ref 98–109)
CO2: 31 mEq/L — ABNORMAL HIGH (ref 22–29)
Creatinine: 0.9 mg/dL (ref 0.6–1.1)
EGFR: 65 mL/min/{1.73_m2} — ABNORMAL LOW (ref >90)
Glucose: 117 mg/dl (ref 70–140)
POTASSIUM: 3.1 meq/L — AB (ref 3.5–5.1)
Sodium: 141 mEq/L (ref 136–145)
Total Bilirubin: 0.71 mg/dL (ref 0.20–1.20)
Total Protein: 7.2 g/dL (ref 6.4–8.3)

## 2015-06-18 LAB — CBC WITH DIFFERENTIAL (CANCER CENTER ONLY)
BASO#: 0 10*3/uL (ref 0.0–0.2)
BASO%: 0.2 % (ref 0.0–2.0)
EOS ABS: 0.7 10*3/uL — AB (ref 0.0–0.5)
EOS%: 12 % — ABNORMAL HIGH (ref 0.0–7.0)
HEMATOCRIT: 23.5 % — AB (ref 34.8–46.6)
HEMOGLOBIN: 6 g/dL — AB (ref 11.6–15.9)
LYMPH#: 0.9 10*3/uL (ref 0.9–3.3)
LYMPH%: 15 % (ref 14.0–48.0)
MCH: 18.5 pg — AB (ref 26.0–34.0)
MCHC: 25.5 g/dL — AB (ref 32.0–36.0)
MCV: 72 fL — AB (ref 81–101)
MONO#: 0.8 10*3/uL (ref 0.1–0.9)
MONO%: 14.1 % — ABNORMAL HIGH (ref 0.0–13.0)
NEUT#: 3.4 10*3/uL (ref 1.5–6.5)
NEUT%: 58.7 % (ref 39.6–80.0)
Platelets: 188 10*3/uL (ref 145–400)
RBC: 3.25 10*6/uL — ABNORMAL LOW (ref 3.70–5.32)
RDW: 21.9 % — AB (ref 11.1–15.7)
WBC: 5.7 10*3/uL (ref 3.9–10.0)

## 2015-06-18 MED ORDER — CIPROFLOXACIN HCL 500 MG PO TABS: ORAL_TABLET | ORAL | Status: DC

## 2015-06-18 NOTE — Assessment & Plan Note (Signed)
con't armour thyroid Check labs Pt thyroid numbers always slightly hyperthyroid--- per pt endo kept her meds the same because she did the best that way clinically Will try to get records from endo

## 2015-06-18 NOTE — Telephone Encounter (Signed)
Critical Value HGB 6.0 Dr Marin Olp notified. No orders at this time.

## 2015-06-18 NOTE — Progress Notes (Signed)
Hematology and Oncology Follow Up Visit  [name] [id number] Aug 28, 1945 70 y.o. 06/18/2015   Principle Diagnosis:  Polycythemia vera -- hyperviscosity variant. 2. 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  Current Therapy:    Phlebotomy to maintain hemoglobin below 6.0     Interim History:  [name] is back for followup. She is feeling somewhat tired. She feels that her blood is too "high" for her.  She's not been phlebotomized for about 7 weeks.  She's had a little bit of a headache. She's had no visual problems.  There's no cough. She's had no shortness of breath. She does get fatigued more easily.  She's had no change in bowel or bladder habits.  She's had no leg swelling. She does have fibromyalgia but this is not "flared up".  There's not been any issues with rashes. She's had no fever. She's had no problem with infections. She is prone to UTIs.  She is incredibly iron deficient by nature of her phlebotomies. We have to keep her iron levels down because she will definitely increase her hemoglobin if her iron level started going up. This would be quite risky for her.    Overall, her performance status is ECOG 1. Medications:Allergies:  Allergies  Allergen Reactions  . Ethrane [Enflurane] Nausea And Vomiting  . Codeine Nausea Only  . Synthroid [Levothyroxine Sodium] Other (See Comments)    Not effective. Causes excessive sleepiness. Takes armour thyroid  . Calcium Channel Blockers Swelling, Palpitations and Hypertension  . Penicillins Rash    Past Medical History, Surgical history, Social history, and Family History were reviewed and updated.  Review of Systems: As above  Physical Exam:  height is 5\' 3"  (1.6 m) and weight is 159 lb (72.122 kg). Her oral temperature is 97.7 F (36.5 C). Her blood pressure is 131/37 and her pulse is 79. Her respiration is 16.   Well-developed and well-nourished white female in no obvious  distress. Head and neck exam shows pale conjunctiva. There is no oral lesions. There is no adenopathy in the neck. Lungs are clear bilaterally. Cardiac exam regular rate and rhythm with a 1/6 systolic ejection murmur. Abdomen is soft. Has good bowel sounds. There is no palpable liver or spleen tip appreciative well-healed laparotomy scars. Back exam no tenderness over the spine ribs or hips. Extremities shows no clubbing cyanosis or edema. Neurological exam shows no focal neurological deficits. Skin exam no rashes, ecchymoses or petechia.  Lab Results  Component Value Date   WBC 5.7 06/18/2015   HGB 6.0* 06/18/2015   HCT 23.5* 06/18/2015   MCV 72* 06/18/2015   PLT 188 06/18/2015     Chemistry      Component Value Date/Time   NA 139 06/04/2015 1331   NA 136 12/04/2014 1502   NA 143 06/05/2014 1312   K 3.5 06/04/2015 1331   K 3.1* 12/04/2014 1502   K 3.3 06/05/2014 1312   CL 95* 12/04/2014 1502   CL 102 06/05/2014 1312   CO2 30* 06/04/2015 1331   CO2 30 12/04/2014 1502   CO2 32 06/05/2014 1312   BUN 9.3 06/04/2015 1331   BUN 10 12/04/2014 1502   BUN 6* 06/05/2014 1312   CREATININE 0.8 06/04/2015 1331   CREATININE 0.84 12/04/2014 1502   CREATININE 0.8 06/05/2014 1312      Component Value Date/Time   CALCIUM 8.8 06/04/2015 1331   CALCIUM 9.0 12/04/2014 1502   CALCIUM 8.9 06/05/2014 1312  ALKPHOS 122 06/04/2015 1331   ALKPHOS 97 12/04/2014 1502   ALKPHOS 100* 06/05/2014 1312   AST 14 06/04/2015 1331   AST 14 12/04/2014 1502   AST 22 06/05/2014 1312   ALT <9 06/04/2015 1331   ALT 8 12/04/2014 1502   ALT 9* 06/05/2014 1312   BILITOT 0.74 06/04/2015 1331   BILITOT 0.6 12/04/2014 1502   BILITOT 0.60 06/05/2014 1312         Impression and Plan: [name] is 70 year old female with polycythemia vera. She is hyperviscous.  Her hemoglobin is definitely up for her. As such, we will go ahead and phlebotomize her.  Overall, I think she is doing quite well.  She is quite  sad that her doctor of 25 years has retired but she is very excited about her new doctor, [name].   Sarah Napoleon, MD 3/22/20172:32 PM

## 2015-06-20 ENCOUNTER — Ambulatory Visit (HOSPITAL_BASED_OUTPATIENT_CLINIC_OR_DEPARTMENT_OTHER): Payer: Medicare Other

## 2015-06-20 VITALS — BP 93/44 | HR 58 | Temp 97.9°F | Resp 14

## 2015-06-20 DIAGNOSIS — D45 Polycythemia vera: Secondary | ICD-10-CM | POA: Diagnosis present

## 2015-06-20 NOTE — Patient Instructions (Signed)
Therapeutic Phlebotomy, Care After  Refer to this sheet in the next few weeks. These instructions provide you with information about caring for yourself after your procedure. Your health care provider may also give you more specific instructions. Your treatment has been planned according to current medical practices, but problems sometimes occur. Call your health care provider if you have any problems or questions after your procedure.  WHAT TO EXPECT AFTER THE PROCEDURE  After your procedure, it is common to have:   Light-headedness or dizziness. You may feel faint.   Nausea.   Tiredness.  HOME CARE INSTRUCTIONS  Activities   Return to your normal activities as directed by your health care provider. Most people can go back to their normal activities right away.   Avoid strenuous physical activity and heavy lifting or pulling for about 5 hours after the procedure. Do not lift anything that is heavier than 10 lb (4.5 kg).   Athletes should avoid strenuous exercise for at least 12 hours.   Change positions slowly for the remainder of the day. This will help to prevent light-headedness or fainting.   If you feel light-headed, lie down until the feeling goes away.  Eating and Drinking   Be sure to eat well-balanced meals for the next 24 hours.   Drink enough fluid to keep your urine clear or pale yellow.   Avoid drinking alcohol on the day that you had the procedure.  Care of the Needle Insertion Site   Keep your bandage dry. You can remove the bandage after about 5 hours or as directed by your health care provider.   If you have bleeding from the needle insertion site, elevate your arm and press firmly on the site until the bleeding stops.   If you have bruising at the site, apply ice to the area:   Put ice in a plastic bag.   Place a towel between your skin and the bag.   Leave the ice on for 20 minutes, 2-3 times a day for the first 24 hours.   If the swelling does not go away after 24 hours, apply  a warm, moist washcloth to the area for 20 minutes, 2-3 times a day.  General Instructions   Avoid smoking for at least 30 minutes after the procedure.   Keep all follow-up visits as directed by your health care provider. It is important to continue with further therapeutic phlebotomy treatments as directed.  SEEK MEDICAL CARE IF:   You have redness, swelling, or pain at the needle insertion site.   You have fluid, blood, or pus coming from the needle insertion site.   You feel light-headed, dizzy, or nauseated, and the feeling does not go away.   You notice new bruising at the needle insertion site.   You feel weaker than normal.   You have a fever or chills.  SEEK IMMEDIATE MEDICAL CARE IF:   You have severe nausea or vomiting.   You have chest pain.   You have trouble breathing.    This information is not intended to replace advice given to you by your health care provider. Make sure you discuss any questions you have with your health care provider.    Document Released: 08/17/2010 Document Revised: 07/30/2014 Document Reviewed: 03/11/2014  Elsevier Interactive Patient Education 2016 [employer]

## 2015-07-02 ENCOUNTER — Other Ambulatory Visit (HOSPITAL_BASED_OUTPATIENT_CLINIC_OR_DEPARTMENT_OTHER): Payer: Medicare Other

## 2015-07-02 ENCOUNTER — Other Ambulatory Visit: Payer: Self-pay | Admitting: *Deleted

## 2015-07-02 DIAGNOSIS — D45 Polycythemia vera: Secondary | ICD-10-CM

## 2015-07-02 DIAGNOSIS — I358 Other nonrheumatic aortic valve disorders: Secondary | ICD-10-CM

## 2015-07-02 DIAGNOSIS — M797 Fibromyalgia: Secondary | ICD-10-CM

## 2015-07-02 LAB — CBC WITH DIFFERENTIAL (CANCER CENTER ONLY)
BASO#: 0 10*3/uL (ref 0.0–0.2)
BASO%: 0.5 % (ref 0.0–2.0)
EOS ABS: 0.4 10*3/uL (ref 0.0–0.5)
EOS%: 9.7 % — ABNORMAL HIGH (ref 0.0–7.0)
HCT: 21.4 % — ABNORMAL LOW (ref 34.8–46.6)
HGB: 5.3 g/dL — CL (ref 11.6–15.9)
LYMPH#: 0.7 10*3/uL — ABNORMAL LOW (ref 0.9–3.3)
LYMPH%: 18.5 % (ref 14.0–48.0)
MCH: 18.2 pg — AB (ref 26.0–34.0)
MCHC: 24.8 g/dL — AB (ref 32.0–36.0)
MCV: 74 fL — ABNORMAL LOW (ref 81–101)
MONO#: 0.6 10*3/uL (ref 0.1–0.9)
MONO%: 14.6 % — AB (ref 0.0–13.0)
NEUT#: 2.2 10*3/uL (ref 1.5–6.5)
NEUT%: 56.7 % (ref 39.6–80.0)
PLATELETS: 172 10*3/uL (ref 145–400)
RBC: 2.91 10*6/uL — AB (ref 3.70–5.32)
RDW: 21 % — AB (ref 11.1–15.7)
WBC: 3.8 10*3/uL — AB (ref 3.9–10.0)

## 2015-07-02 LAB — COMPREHENSIVE METABOLIC PANEL
ANION GAP: 8 meq/L (ref 3–11)
AST: 13 U/L (ref 5–34)
Albumin: 3.4 g/dL — ABNORMAL LOW (ref 3.5–5.0)
Alkaline Phosphatase: 105 U/L (ref 40–150)
BILIRUBIN TOTAL: 0.68 mg/dL (ref 0.20–1.20)
BUN: 12.5 mg/dL (ref 7.0–26.0)
CHLORIDE: 103 meq/L (ref 98–109)
CO2: 31 meq/L — AB (ref 22–29)
CREATININE: 0.8 mg/dL (ref 0.6–1.1)
Calcium: 8.9 mg/dL (ref 8.4–10.4)
EGFR: 70 mL/min/{1.73_m2} — ABNORMAL LOW (ref >90)
GLUCOSE: 109 mg/dL (ref 70–140)
Potassium: 3.1 mEq/L — ABNORMAL LOW (ref 3.5–5.1)
SODIUM: 142 meq/L (ref 136–145)
TOTAL PROTEIN: 6.6 g/dL (ref 6.4–8.3)

## 2015-07-02 MED ORDER — OXYCODONE HCL ER 10 MG PO T12A: 10.0000 mg | EXTENDED_RELEASE_TABLET | Freq: Two times a day (BID) | ORAL | Status: DC

## 2015-07-02 MED ORDER — OXYCODONE HCL ER 20 MG PO T12A: 20.0000 mg | EXTENDED_RELEASE_TABLET | Freq: Three times a day (TID) | ORAL | Status: DC

## 2015-07-02 MED ORDER — OXYCODONE HCL 15 MG PO TABS: 15.0000 mg | ORAL_TABLET | Freq: Four times a day (QID) | ORAL | Status: DC | PRN

## 2015-07-03 ENCOUNTER — Other Ambulatory Visit: Payer: Self-pay | Admitting: Family Medicine

## 2015-07-03 MED ORDER — TEMAZEPAM 30 MG PO CAPS: 30.0000 mg | ORAL_CAPSULE | Freq: Every evening | ORAL | Status: DC | PRN

## 2015-07-03 NOTE — Telephone Encounter (Signed)
Pt's spouse called in to request a refill for temazepam.    CB: [phone]

## 2015-07-03 NOTE — Telephone Encounter (Signed)
Last seen 06/16/15  please advise     KP

## 2015-07-07 ENCOUNTER — Other Ambulatory Visit: Payer: Self-pay | Admitting: Cardiology

## 2015-07-10 DIAGNOSIS — K644 Residual hemorrhoidal skin tags: Secondary | ICD-10-CM | POA: Diagnosis not present

## 2015-07-10 DIAGNOSIS — Z780 Asymptomatic menopausal state: Secondary | ICD-10-CM | POA: Diagnosis not present

## 2015-07-10 DIAGNOSIS — Z1212 Encounter for screening for malignant neoplasm of rectum: Secondary | ICD-10-CM | POA: Diagnosis not present

## 2015-07-15 ENCOUNTER — Other Ambulatory Visit: Payer: Self-pay | Admitting: Hematology & Oncology

## 2015-07-16 ENCOUNTER — Other Ambulatory Visit (HOSPITAL_BASED_OUTPATIENT_CLINIC_OR_DEPARTMENT_OTHER): Payer: Medicare Other

## 2015-07-16 ENCOUNTER — Telehealth: Payer: Self-pay | Admitting: *Deleted

## 2015-07-16 DIAGNOSIS — D45 Polycythemia vera: Secondary | ICD-10-CM | POA: Diagnosis present

## 2015-07-16 LAB — CBC WITH DIFFERENTIAL (CANCER CENTER ONLY)
BASO#: 0 10*3/uL (ref 0.0–0.2)
BASO%: 0.2 % (ref 0.0–2.0)
EOS%: 9.4 % — AB (ref 0.0–7.0)
Eosinophils Absolute: 0.5 10*3/uL (ref 0.0–0.5)
HEMATOCRIT: 21.7 % — AB (ref 34.8–46.6)
HEMOGLOBIN: 5.5 g/dL — AB (ref 11.6–15.9)
LYMPH#: 0.8 10*3/uL — AB (ref 0.9–3.3)
LYMPH%: 15.7 % (ref 14.0–48.0)
MCH: 18.5 pg — ABNORMAL LOW (ref 26.0–34.0)
MCHC: 25.3 g/dL — AB (ref 32.0–36.0)
MCV: 73 fL — ABNORMAL LOW (ref 81–101)
MONO#: 0.6 10*3/uL (ref 0.1–0.9)
MONO%: 12.6 % (ref 0.0–13.0)
NEUT%: 62.1 % (ref 39.6–80.0)
NEUTROS ABS: 3 10*3/uL (ref 1.5–6.5)
Platelets: 224 10*3/uL (ref 145–400)
RBC: 2.97 10*6/uL — ABNORMAL LOW (ref 3.70–5.32)
RDW: 20.3 % — ABNORMAL HIGH (ref 11.1–15.7)
WBC: 4.8 10*3/uL (ref 3.9–10.0)

## 2015-07-16 NOTE — Telephone Encounter (Signed)
Critical Value HGB 5.5 Dr Marin Olp aware. No orders at this time.

## 2015-07-23 DIAGNOSIS — H348322 Tributary (branch) retinal vein occlusion, left eye, stable: Secondary | ICD-10-CM | POA: Diagnosis not present

## 2015-07-23 DIAGNOSIS — H25813 Combined forms of age-related cataract, bilateral: Secondary | ICD-10-CM | POA: Diagnosis not present

## 2015-07-23 DIAGNOSIS — H52221 Regular astigmatism, right eye: Secondary | ICD-10-CM | POA: Diagnosis not present

## 2015-07-23 DIAGNOSIS — H5213 Myopia, bilateral: Secondary | ICD-10-CM | POA: Diagnosis not present

## 2015-07-28 ENCOUNTER — Other Ambulatory Visit: Payer: Self-pay | Admitting: *Deleted

## 2015-07-28 DIAGNOSIS — I358 Other nonrheumatic aortic valve disorders: Secondary | ICD-10-CM

## 2015-07-28 DIAGNOSIS — D45 Polycythemia vera: Secondary | ICD-10-CM

## 2015-07-28 DIAGNOSIS — M797 Fibromyalgia: Secondary | ICD-10-CM

## 2015-07-28 MED ORDER — OXYCODONE HCL 15 MG PO TABS: 15.0000 mg | ORAL_TABLET | Freq: Four times a day (QID) | ORAL | Status: DC | PRN

## 2015-07-28 MED ORDER — OXYCODONE HCL ER 20 MG PO T12A: 20.0000 mg | EXTENDED_RELEASE_TABLET | Freq: Three times a day (TID) | ORAL | Status: DC

## 2015-07-28 MED ORDER — OXYCODONE HCL ER 10 MG PO T12A: 10.0000 mg | EXTENDED_RELEASE_TABLET | Freq: Two times a day (BID) | ORAL | Status: DC

## 2015-07-30 ENCOUNTER — Other Ambulatory Visit: Payer: Self-pay | Admitting: *Deleted

## 2015-07-30 ENCOUNTER — Other Ambulatory Visit (HOSPITAL_BASED_OUTPATIENT_CLINIC_OR_DEPARTMENT_OTHER): Payer: Medicare Other

## 2015-07-30 ENCOUNTER — Telehealth: Payer: Self-pay | Admitting: *Deleted

## 2015-07-30 DIAGNOSIS — D45 Polycythemia vera: Secondary | ICD-10-CM | POA: Diagnosis present

## 2015-07-30 DIAGNOSIS — N390 Urinary tract infection, site not specified: Secondary | ICD-10-CM

## 2015-07-30 LAB — CBC WITH DIFFERENTIAL (CANCER CENTER ONLY)
BASO#: 0 10*3/uL (ref 0.0–0.2)
BASO%: 0.4 % (ref 0.0–2.0)
EOS%: 10 % — AB (ref 0.0–7.0)
Eosinophils Absolute: 0.5 10*3/uL (ref 0.0–0.5)
HEMATOCRIT: 23.1 % — AB (ref 34.8–46.6)
HGB: 5.8 g/dL — CL (ref 11.6–15.9)
LYMPH#: 0.7 10*3/uL — AB (ref 0.9–3.3)
LYMPH%: 14.8 % (ref 14.0–48.0)
MCH: 18.1 pg — ABNORMAL LOW (ref 26.0–34.0)
MCHC: 25.1 g/dL — ABNORMAL LOW (ref 32.0–36.0)
MCV: 72 fL — ABNORMAL LOW (ref 81–101)
MONO#: 0.7 10*3/uL (ref 0.1–0.9)
MONO%: 15.5 % — AB (ref 0.0–13.0)
NEUT%: 59.3 % (ref 39.6–80.0)
NEUTROS ABS: 2.7 10*3/uL (ref 1.5–6.5)
Platelets: 152 10*3/uL (ref 145–400)
RBC: 3.2 10*6/uL — ABNORMAL LOW (ref 3.70–5.32)
RDW: 19.8 % — AB (ref 11.1–15.7)
WBC: 4.5 10*3/uL (ref 3.9–10.0)

## 2015-07-30 MED ORDER — CIPROFLOXACIN HCL 500 MG PO TABS: ORAL_TABLET | ORAL | Status: DC

## 2015-07-30 NOTE — Telephone Encounter (Signed)
Critical Value Hgb 5.8 Dr Ennever notified. No orders at this time 

## 2015-08-11 ENCOUNTER — Other Ambulatory Visit: Payer: Self-pay | Admitting: *Deleted

## 2015-08-11 ENCOUNTER — Telehealth: Payer: Self-pay | Admitting: *Deleted

## 2015-08-11 ENCOUNTER — Ambulatory Visit (HOSPITAL_BASED_OUTPATIENT_CLINIC_OR_DEPARTMENT_OTHER): Payer: Medicare Other

## 2015-08-11 ENCOUNTER — Other Ambulatory Visit (HOSPITAL_BASED_OUTPATIENT_CLINIC_OR_DEPARTMENT_OTHER): Payer: Medicare Other

## 2015-08-11 VITALS — BP 112/49 | HR 63 | Temp 98.0°F | Resp 20

## 2015-08-11 DIAGNOSIS — D45 Polycythemia vera: Secondary | ICD-10-CM

## 2015-08-11 LAB — CBC WITH DIFFERENTIAL (CANCER CENTER ONLY)
BASO#: 0 10*3/uL (ref 0.0–0.2)
BASO%: 0.2 % (ref 0.0–2.0)
EOS%: 5.5 % (ref 0.0–7.0)
Eosinophils Absolute: 0.2 10*3/uL (ref 0.0–0.5)
HCT: 24.3 % — ABNORMAL LOW (ref 34.8–46.6)
HGB: 6 g/dL — CL (ref 11.6–15.9)
LYMPH#: 0.7 10*3/uL — ABNORMAL LOW (ref 0.9–3.3)
LYMPH%: 16.2 % (ref 14.0–48.0)
MCH: 18.1 pg — ABNORMAL LOW (ref 26.0–34.0)
MCHC: 24.7 g/dL — ABNORMAL LOW (ref 32.0–36.0)
MCV: 73 fL — ABNORMAL LOW (ref 81–101)
MONO#: 0.7 10*3/uL (ref 0.1–0.9)
MONO%: 16.9 % — AB (ref 0.0–13.0)
NEUT#: 2.6 10*3/uL (ref 1.5–6.5)
NEUT%: 61.2 % (ref 39.6–80.0)
PLATELETS: 151 10*3/uL (ref 145–400)
RBC: 3.32 10*6/uL — ABNORMAL LOW (ref 3.70–5.32)
RDW: 20.7 % — AB (ref 11.1–15.7)
WBC: 4.2 10*3/uL (ref 3.9–10.0)

## 2015-08-11 NOTE — Telephone Encounter (Signed)
Patient would like to come in today for a phlebotomy. She states she is having symptoms related to her hgb being above 6 (she is phlebotomized for hgb >6). Her last lab draw on 07/30/15 showed her hgb to be 5.8.   We will bring her in today for labs, followed by a possible phlebotomy.

## 2015-08-11 NOTE — Telephone Encounter (Signed)
Critical Value Hgb 6.0 Dr Marin Olp notified. Patient will proceed with phlebotomy as ordered.

## 2015-08-11 NOTE — Patient Instructions (Signed)
Therapeutic Phlebotomy, Care After  Refer to this sheet in the next few weeks. These instructions provide you with information about caring for yourself after your procedure. Your health care provider may also give you more specific instructions. Your treatment has been planned according to current medical practices, but problems sometimes occur. Call your health care provider if you have any problems or questions after your procedure.  WHAT TO EXPECT AFTER THE PROCEDURE  After your procedure, it is common to have:   Light-headedness or dizziness. You may feel faint.   Nausea.   Tiredness.  HOME CARE INSTRUCTIONS  Activities   Return to your normal activities as directed by your health care provider. Most people can go back to their normal activities right away.   Avoid strenuous physical activity and heavy lifting or pulling for about 5 hours after the procedure. Do not lift anything that is heavier than 10 lb (4.5 kg).   Athletes should avoid strenuous exercise for at least 12 hours.   Change positions slowly for the remainder of the day. This will help to prevent light-headedness or fainting.   If you feel light-headed, lie down until the feeling goes away.  Eating and Drinking   Be sure to eat well-balanced meals for the next 24 hours.   Drink enough fluid to keep your urine clear or pale yellow.   Avoid drinking alcohol on the day that you had the procedure.  Care of the Needle Insertion Site   Keep your bandage dry. You can remove the bandage after about 5 hours or as directed by your health care provider.   If you have bleeding from the needle insertion site, elevate your arm and press firmly on the site until the bleeding stops.   If you have bruising at the site, apply ice to the area:   Put ice in a plastic bag.   Place a towel between your skin and the bag.   Leave the ice on for 20 minutes, 2-3 times a day for the first 24 hours.   If the swelling does not go away after 24 hours, apply  a warm, moist washcloth to the area for 20 minutes, 2-3 times a day.  General Instructions   Avoid smoking for at least 30 minutes after the procedure.   Keep all follow-up visits as directed by your health care provider. It is important to continue with further therapeutic phlebotomy treatments as directed.  SEEK MEDICAL CARE IF:   You have redness, swelling, or pain at the needle insertion site.   You have fluid, blood, or pus coming from the needle insertion site.   You feel light-headed, dizzy, or nauseated, and the feeling does not go away.   You notice new bruising at the needle insertion site.   You feel weaker than normal.   You have a fever or chills.  SEEK IMMEDIATE MEDICAL CARE IF:   You have severe nausea or vomiting.   You have chest pain.   You have trouble breathing.    This information is not intended to replace advice given to you by your health care provider. Make sure you discuss any questions you have with your health care provider.    Document Released: 08/17/2010 Document Revised: 07/30/2014 Document Reviewed: 03/11/2014  Elsevier Interactive Patient Education 2016 [employer]

## 2015-08-11 NOTE — Progress Notes (Signed)
[name] presents today for phlebotomy per MD orders. Phlebotomy procedure started at 1146 and ended at 1155. Approximately 500 mls removed. Patient observed for 30 minutes after procedure without any incident. Patient tolerated procedure well. IV needle removed intact.

## 2015-08-12 ENCOUNTER — Other Ambulatory Visit: Payer: Self-pay | Admitting: *Deleted

## 2015-08-12 DIAGNOSIS — D45 Polycythemia vera: Secondary | ICD-10-CM

## 2015-08-13 ENCOUNTER — Other Ambulatory Visit (HOSPITAL_BASED_OUTPATIENT_CLINIC_OR_DEPARTMENT_OTHER): Payer: Medicare Other

## 2015-08-13 ENCOUNTER — Encounter: Payer: Self-pay | Admitting: Hematology & Oncology

## 2015-08-13 ENCOUNTER — Ambulatory Visit (HOSPITAL_BASED_OUTPATIENT_CLINIC_OR_DEPARTMENT_OTHER): Payer: Medicare Other | Admitting: Hematology & Oncology

## 2015-08-13 ENCOUNTER — Telehealth: Payer: Self-pay | Admitting: *Deleted

## 2015-08-13 VITALS — BP 135/48 | HR 79 | Temp 98.2°F | Resp 16 | Ht 63.0 in | Wt 163.0 lb

## 2015-08-13 DIAGNOSIS — D45 Polycythemia vera: Secondary | ICD-10-CM | POA: Diagnosis not present

## 2015-08-13 DIAGNOSIS — Z8673 Personal history of transient ischemic attack (TIA), and cerebral infarction without residual deficits: Secondary | ICD-10-CM

## 2015-08-13 LAB — CBC WITH DIFFERENTIAL (CANCER CENTER ONLY)
BASO#: 0 10*3/uL (ref 0.0–0.2)
BASO%: 0.2 % (ref 0.0–2.0)
EOS ABS: 0.4 10*3/uL (ref 0.0–0.5)
EOS%: 9.4 % — ABNORMAL HIGH (ref 0.0–7.0)
HCT: 21.5 % — ABNORMAL LOW (ref 34.8–46.6)
HEMOGLOBIN: 5.4 g/dL — AB (ref 11.6–15.9)
LYMPH#: 0.8 10*3/uL — ABNORMAL LOW (ref 0.9–3.3)
LYMPH%: 17.4 % (ref 14.0–48.0)
MCH: 18.5 pg — AB (ref 26.0–34.0)
MCHC: 25.1 g/dL — AB (ref 32.0–36.0)
MCV: 74 fL — AB (ref 81–101)
MONO#: 0.6 10*3/uL (ref 0.1–0.9)
MONO%: 13.3 % — AB (ref 0.0–13.0)
NEUT%: 59.7 % (ref 39.6–80.0)
NEUTROS ABS: 2.6 10*3/uL (ref 1.5–6.5)
Platelets: 232 10*3/uL (ref 145–400)
RBC: 2.92 10*6/uL — ABNORMAL LOW (ref 3.70–5.32)
RDW: 20.7 % — ABNORMAL HIGH (ref 11.1–15.7)
WBC: 4.4 10*3/uL (ref 3.9–10.0)

## 2015-08-13 LAB — COMPREHENSIVE METABOLIC PANEL
ALBUMIN: 3.6 g/dL (ref 3.5–5.0)
ALK PHOS: 110 U/L (ref 40–150)
ALT: <9 U/L (ref 0–55)
ANION GAP: 6 meq/L (ref 3–11)
AST: 12 U/L (ref 5–34)
BILIRUBIN TOTAL: 0.61 mg/dL (ref 0.20–1.20)
BUN: 8.8 mg/dL (ref 7.0–26.0)
CO2: 28 mEq/L (ref 22–29)
Calcium: 8.9 mg/dL (ref 8.4–10.4)
Chloride: 106 mEq/L (ref 98–109)
Creatinine: 0.8 mg/dL (ref 0.6–1.1)
EGFR: 73 mL/min/{1.73_m2} — AB (ref >90)
GLUCOSE: 129 mg/dL (ref 70–140)
POTASSIUM: 3.9 meq/L (ref 3.5–5.1)
SODIUM: 139 meq/L (ref 136–145)
Total Protein: 6.6 g/dL (ref 6.4–8.3)

## 2015-08-13 NOTE — Progress Notes (Signed)
Hematology and Oncology Follow Up Visit  [name] [id number] 07-14-1945 70 y.o. 08/13/2015   Principle Diagnosis:  Polycythemia vera -- hyperviscosity variant. 2. 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  Current Therapy:    Phlebotomy to maintain hemoglobin below 6.0     Interim History:  [name] is back for followup. She was phlebotomized 2 days ago. She really felt tired. She felt very sluggish. This is very typical of her when her blood count gets "too high" for her. After she gets phlebotomized, she feels a whole lot better.  She's had a little bit of a headache. She's had no visual problems.  There's no cough. She's had no shortness of breath. She does get fatigued more easily.  She's had no change in bowel or bladder habits.  She's had no leg swelling. She does have fibromyalgia but this is not "flared up".  There's not been any issues with rashes. She's had no fever. She's had no problem with infections. She is prone to UTIs.  She is incredibly iron deficient by nature of her phlebotomies. We have to keep her iron levels down because she will definitely increase her hemoglobin if her iron level started going up. This would be quite risky for her.   Overall, her performance status is ECOG 1.   Medications:Allergies:  Allergies  Allergen Reactions  . Ethrane [Enflurane] Nausea And Vomiting  . Codeine Nausea Only  . Synthroid [Levothyroxine Sodium] Other (See Comments)    Not effective. Causes excessive sleepiness. Takes armour thyroid  . Calcium Channel Blockers Swelling, Palpitations and Hypertension  . Penicillins Rash    Past Medical History, Surgical history, Social history, and Family History were reviewed and updated.  Review of Systems: As above  Physical Exam:  height is 5\' 3"  (1.6 m) and weight is 163 lb (73.936 kg). Her oral temperature is 98.2 F (36.8 C). Her blood pressure is 135/48 and her pulse is 79.  Her respiration is 16.   Well-developed and well-nourished white female in no obvious distress. Head and neck exam shows pale conjunctiva. There is no oral lesions. There is no adenopathy in the neck. Lungs are clear bilaterally. Cardiac exam regular rate and rhythm with a 1/6 systolic ejection murmur. Abdomen is soft. Has good bowel sounds. There is no palpable liver or spleen tip appreciative well-healed laparotomy scars. Back exam no tenderness over the spine ribs or hips. Extremities shows no clubbing cyanosis or edema. Neurological exam shows no focal neurological deficits. Skin exam no rashes, ecchymoses or petechia.  Lab Results  Component Value Date   WBC 4.4 08/13/2015   HGB 5.4* 08/13/2015   HCT 21.5* 08/13/2015   MCV 74* 08/13/2015   PLT 232 08/13/2015     Chemistry      Component Value Date/Time   NA 142 07/02/2015 1307   NA 136 12/04/2014 1502   NA 143 06/05/2014 1312   K 3.1* 07/02/2015 1307   K 3.1* 12/04/2014 1502   K 3.3 06/05/2014 1312   CL 95* 12/04/2014 1502   CL 102 06/05/2014 1312   CO2 31* 07/02/2015 1307   CO2 30 12/04/2014 1502   CO2 32 06/05/2014 1312   BUN 12.5 07/02/2015 1307   BUN 10 12/04/2014 1502   BUN 6* 06/05/2014 1312   CREATININE 0.8 07/02/2015 1307   CREATININE 0.84 12/04/2014 1502   CREATININE 0.8 06/05/2014 1312      Component Value Date/Time  CALCIUM 8.9 07/02/2015 1307   CALCIUM 9.0 12/04/2014 1502   CALCIUM 8.9 06/05/2014 1312   ALKPHOS 105 07/02/2015 1307   ALKPHOS 97 12/04/2014 1502   ALKPHOS 100* 06/05/2014 1312   AST 13 07/02/2015 1307   AST 14 12/04/2014 1502   AST 22 06/05/2014 1312   ALT <9 07/02/2015 1307   ALT 8 12/04/2014 1502   ALT 9* 06/05/2014 1312   BILITOT 0.68 07/02/2015 1307   BILITOT 0.6 12/04/2014 1502   BILITOT 0.60 06/05/2014 1312         Impression and Plan: [name] is 70 year old female with polycythemia vera. She is hyperviscous. She was phlebotomized a little early. However, she feels a lot  better.  Overall, I think she is doing quite well.  She is quite sad that her doctor of 25 years has retired but she is very excited about her new doctor, Dr. Etter Sjogren.   Volanda Napoleon, MD 5/17/20172:15 PM

## 2015-08-13 NOTE — Telephone Encounter (Signed)
Critical Value Hgb 5.4 Dr Marin Olp notified. No orders received.

## 2015-08-22 ENCOUNTER — Other Ambulatory Visit: Payer: Self-pay | Admitting: *Deleted

## 2015-08-22 DIAGNOSIS — D45 Polycythemia vera: Secondary | ICD-10-CM

## 2015-08-22 DIAGNOSIS — M797 Fibromyalgia: Secondary | ICD-10-CM

## 2015-08-22 DIAGNOSIS — I358 Other nonrheumatic aortic valve disorders: Secondary | ICD-10-CM

## 2015-08-22 MED ORDER — OXYCODONE HCL ER 20 MG PO T12A: 20.0000 mg | EXTENDED_RELEASE_TABLET | Freq: Three times a day (TID) | ORAL | Status: DC

## 2015-08-22 MED ORDER — OXYCODONE HCL ER 10 MG PO T12A: 10.0000 mg | EXTENDED_RELEASE_TABLET | Freq: Two times a day (BID) | ORAL | Status: DC

## 2015-08-22 MED ORDER — OXYCODONE HCL 15 MG PO TABS: 15.0000 mg | ORAL_TABLET | Freq: Four times a day (QID) | ORAL | Status: DC | PRN

## 2015-08-27 ENCOUNTER — Other Ambulatory Visit (HOSPITAL_BASED_OUTPATIENT_CLINIC_OR_DEPARTMENT_OTHER): Payer: Medicare Other

## 2015-08-27 ENCOUNTER — Telehealth: Payer: Self-pay | Admitting: *Deleted

## 2015-08-27 DIAGNOSIS — D45 Polycythemia vera: Secondary | ICD-10-CM

## 2015-08-27 LAB — CBC WITH DIFFERENTIAL (CANCER CENTER ONLY)
BASO#: 0 10*3/uL (ref 0.0–0.2)
BASO%: 0.3 % (ref 0.0–2.0)
EOS%: 10.1 % — ABNORMAL HIGH (ref 0.0–7.0)
Eosinophils Absolute: 0.4 10*3/uL (ref 0.0–0.5)
HCT: 20.7 % — ABNORMAL LOW (ref 34.8–46.6)
HGB: 5.3 g/dL — CL (ref 11.6–15.9)
LYMPH#: 0.6 10*3/uL — ABNORMAL LOW (ref 0.9–3.3)
LYMPH%: 16.2 % (ref 14.0–48.0)
MCH: 18.8 pg — ABNORMAL LOW (ref 26.0–34.0)
MCHC: 25.6 g/dL — ABNORMAL LOW (ref 32.0–36.0)
MCV: 73 fL — ABNORMAL LOW (ref 81–101)
MONO#: 0.5 10*3/uL (ref 0.1–0.9)
MONO%: 12.6 % (ref 0.0–13.0)
NEUT#: 2.4 10*3/uL (ref 1.5–6.5)
NEUT%: 60.8 % (ref 39.6–80.0)
PLATELETS: 151 10*3/uL (ref 145–400)
RBC: 2.82 10*6/uL — AB (ref 3.70–5.32)
RDW: 20.4 % — ABNORMAL HIGH (ref 11.1–15.7)
WBC: 4 10*3/uL (ref 3.9–10.0)

## 2015-08-27 NOTE — Telephone Encounter (Signed)
Critical Value HGB 5.3 Dr Marin Olp notified. No orders at this time.

## 2015-08-29 ENCOUNTER — Other Ambulatory Visit: Payer: Self-pay | Admitting: Family Medicine

## 2015-08-29 NOTE — Telephone Encounter (Signed)
Last seen 06/16/15 and filled 07/07/15 #30 with 2   Please advise     KP

## 2015-09-10 ENCOUNTER — Encounter: Payer: Self-pay | Admitting: *Deleted

## 2015-09-10 ENCOUNTER — Other Ambulatory Visit (HOSPITAL_BASED_OUTPATIENT_CLINIC_OR_DEPARTMENT_OTHER): Payer: Medicare Other

## 2015-09-10 ENCOUNTER — Telehealth: Payer: Self-pay | Admitting: *Deleted

## 2015-09-10 DIAGNOSIS — D45 Polycythemia vera: Secondary | ICD-10-CM | POA: Diagnosis present

## 2015-09-10 LAB — CBC WITH DIFFERENTIAL (CANCER CENTER ONLY)
BASO#: 0 10*3/uL (ref 0.0–0.2)
BASO%: 0.2 % (ref 0.0–2.0)
EOS%: 4.8 % (ref 0.0–7.0)
Eosinophils Absolute: 0.2 10*3/uL (ref 0.0–0.5)
HEMATOCRIT: 23.9 % — AB (ref 34.8–46.6)
HGB: 5.9 g/dL — CL (ref 11.6–15.9)
LYMPH#: 0.8 10*3/uL — ABNORMAL LOW (ref 0.9–3.3)
LYMPH%: 18.2 % (ref 14.0–48.0)
MCH: 18 pg — ABNORMAL LOW (ref 26.0–34.0)
MCHC: 24.7 g/dL — ABNORMAL LOW (ref 32.0–36.0)
MCV: 73 fL — ABNORMAL LOW (ref 81–101)
MONO#: 0.7 10*3/uL (ref 0.1–0.9)
MONO%: 14.7 % — ABNORMAL HIGH (ref 0.0–13.0)
NEUT#: 2.8 10*3/uL (ref 1.5–6.5)
NEUT%: 62.1 % (ref 39.6–80.0)
Platelets: 179 10*3/uL (ref 145–400)
RBC: 3.28 10*6/uL — ABNORMAL LOW (ref 3.70–5.32)
RDW: 19.7 % — ABNORMAL HIGH (ref 11.1–15.7)
WBC: 4.6 10*3/uL (ref 3.9–10.0)

## 2015-09-10 NOTE — Progress Notes (Signed)
Labs results from today faxed to Wendie Agreste DDS, MD per patient request for dental appointment tomorrow.

## 2015-09-10 NOTE — Telephone Encounter (Signed)
Critical Value HGB 5.9 Dr Marin Olp notified. No orders at this time.

## 2015-09-16 ENCOUNTER — Ambulatory Visit (HOSPITAL_BASED_OUTPATIENT_CLINIC_OR_DEPARTMENT_OTHER): Payer: Medicare Other

## 2015-09-16 VITALS — BP 95/36 | HR 56 | Temp 97.9°F | Resp 20

## 2015-09-16 DIAGNOSIS — D45 Polycythemia vera: Secondary | ICD-10-CM | POA: Diagnosis present

## 2015-09-16 NOTE — Progress Notes (Signed)
Fabio Pierce presents today for phlebotomy per MD orders. Phlebotomy procedure started at 1300 and ended at 1315. Approximately 500 mls removed. Patient observed for 30 minutes after procedure without any incident. Patient tolerated procedure well. IV needle removed intact.

## 2015-09-16 NOTE — Patient Instructions (Signed)
Therapeutic Phlebotomy, Care After  Refer to this sheet in the next few weeks. These instructions provide you with information about caring for yourself after your procedure. Your health care provider may also give you more specific instructions. Your treatment has been planned according to current medical practices, but problems sometimes occur. Call your health care provider if you have any problems or questions after your procedure.  WHAT TO EXPECT AFTER THE PROCEDURE  After your procedure, it is common to have:   Light-headedness or dizziness. You may feel faint.   Nausea.   Tiredness.  HOME CARE INSTRUCTIONS  Activities   Return to your normal activities as directed by your health care provider. Most people can go back to their normal activities right away.   Avoid strenuous physical activity and heavy lifting or pulling for about 5 hours after the procedure. Do not lift anything that is heavier than 10 lb (4.5 kg).   Athletes should avoid strenuous exercise for at least 12 hours.   Change positions slowly for the remainder of the day. This will help to prevent light-headedness or fainting.   If you feel light-headed, lie down until the feeling goes away.  Eating and Drinking   Be sure to eat well-balanced meals for the next 24 hours.   Drink enough fluid to keep your urine clear or pale yellow.   Avoid drinking alcohol on the day that you had the procedure.  Care of the Needle Insertion Site   Keep your bandage dry. You can remove the bandage after about 5 hours or as directed by your health care provider.   If you have bleeding from the needle insertion site, elevate your arm and press firmly on the site until the bleeding stops.   If you have bruising at the site, apply ice to the area:   Put ice in a plastic bag.   Place a towel between your skin and the bag.   Leave the ice on for 20 minutes, 2-3 times a day for the first 24 hours.   If the swelling does not go away after 24 hours, apply  a warm, moist washcloth to the area for 20 minutes, 2-3 times a day.  General Instructions   Avoid smoking for at least 30 minutes after the procedure.   Keep all follow-up visits as directed by your health care provider. It is important to continue with further therapeutic phlebotomy treatments as directed.  SEEK MEDICAL CARE IF:   You have redness, swelling, or pain at the needle insertion site.   You have fluid, blood, or pus coming from the needle insertion site.   You feel light-headed, dizzy, or nauseated, and the feeling does not go away.   You notice new bruising at the needle insertion site.   You feel weaker than normal.   You have a fever or chills.  SEEK IMMEDIATE MEDICAL CARE IF:   You have severe nausea or vomiting.   You have chest pain.   You have trouble breathing.    This information is not intended to replace advice given to you by your health care provider. Make sure you discuss any questions you have with your health care provider.    Document Released: 08/17/2010 Document Revised: 07/30/2014 Document Reviewed: 03/11/2014  Elsevier Interactive Patient Education 2016 [employer]

## 2015-09-22 ENCOUNTER — Other Ambulatory Visit: Payer: Self-pay

## 2015-09-22 DIAGNOSIS — M797 Fibromyalgia: Secondary | ICD-10-CM

## 2015-09-22 DIAGNOSIS — D45 Polycythemia vera: Secondary | ICD-10-CM

## 2015-09-22 DIAGNOSIS — I358 Other nonrheumatic aortic valve disorders: Secondary | ICD-10-CM

## 2015-09-22 MED ORDER — OXYCODONE HCL 15 MG PO TABS: 15.0000 mg | ORAL_TABLET | Freq: Four times a day (QID) | ORAL | Status: DC | PRN

## 2015-09-22 MED ORDER — OXYCODONE HCL ER 10 MG PO T12A: 10.0000 mg | EXTENDED_RELEASE_TABLET | Freq: Two times a day (BID) | ORAL | Status: DC

## 2015-09-22 MED ORDER — OXYCODONE HCL ER 20 MG PO T12A: 20.0000 mg | EXTENDED_RELEASE_TABLET | Freq: Three times a day (TID) | ORAL | Status: DC

## 2015-09-23 ENCOUNTER — Other Ambulatory Visit: Payer: Self-pay | Admitting: *Deleted

## 2015-09-23 DIAGNOSIS — D45 Polycythemia vera: Secondary | ICD-10-CM

## 2015-09-24 ENCOUNTER — Telehealth: Payer: Self-pay | Admitting: *Deleted

## 2015-09-24 ENCOUNTER — Other Ambulatory Visit (HOSPITAL_BASED_OUTPATIENT_CLINIC_OR_DEPARTMENT_OTHER): Payer: Medicare Other

## 2015-09-24 DIAGNOSIS — D45 Polycythemia vera: Secondary | ICD-10-CM

## 2015-09-24 LAB — CBC WITH DIFFERENTIAL (CANCER CENTER ONLY)
BASO#: 0 10*3/uL (ref 0.0–0.2)
BASO%: 0.3 % (ref 0.0–2.0)
EOS ABS: 0.2 10*3/uL (ref 0.0–0.5)
EOS%: 5 % (ref 0.0–7.0)
HCT: 19.9 % — ABNORMAL LOW (ref 34.8–46.6)
HEMOGLOBIN: 4.9 g/dL — AB (ref 11.6–15.9)
LYMPH#: 0.7 10*3/uL — ABNORMAL LOW (ref 0.9–3.3)
LYMPH%: 17.7 % (ref 14.0–48.0)
MCH: 17.4 pg — AB (ref 26.0–34.0)
MCHC: 24.6 g/dL — ABNORMAL LOW (ref 32.0–36.0)
MCV: 71 fL — AB (ref 81–101)
MONO#: 0.6 10*3/uL (ref 0.1–0.9)
MONO%: 14.8 % — AB (ref 0.0–13.0)
NEUT#: 2.4 10*3/uL (ref 1.5–6.5)
NEUT%: 62.2 % (ref 39.6–80.0)
Platelets: 151 10*3/uL (ref 145–400)
RBC: 2.82 10*6/uL — AB (ref 3.70–5.32)
RDW: 19.1 % — ABNORMAL HIGH (ref 11.1–15.7)
WBC: 3.8 10*3/uL — AB (ref 3.9–10.0)

## 2015-09-24 NOTE — Telephone Encounter (Signed)
Critical Value Hgb 4.9 Dr Marin Olp notified. No orders at this time.

## 2015-09-25 ENCOUNTER — Other Ambulatory Visit: Payer: Self-pay | Admitting: Neurology

## 2015-09-25 NOTE — Telephone Encounter (Signed)
I spoke to pt.   She has been taking  Generic keppra 250mg  po bid now for some time.  She will see if Dr. Etter Sjogren, her pcp will refill for her.   I told her that we can refill this time since seen 11-28-2014.  Future refills per pcp, if not will need to see her yearly.  Pt verbalized understanding.

## 2015-10-08 ENCOUNTER — Other Ambulatory Visit (HOSPITAL_BASED_OUTPATIENT_CLINIC_OR_DEPARTMENT_OTHER): Payer: Medicare Other

## 2015-10-08 ENCOUNTER — Ambulatory Visit (HOSPITAL_BASED_OUTPATIENT_CLINIC_OR_DEPARTMENT_OTHER): Payer: Medicare Other | Admitting: Hematology & Oncology

## 2015-10-08 ENCOUNTER — Encounter: Payer: Self-pay | Admitting: Hematology & Oncology

## 2015-10-08 VITALS — BP 109/43 | HR 72 | Temp 97.5°F | Resp 16 | Ht 63.0 in | Wt 162.0 lb

## 2015-10-08 DIAGNOSIS — Z8673 Personal history of transient ischemic attack (TIA), and cerebral infarction without residual deficits: Secondary | ICD-10-CM

## 2015-10-08 DIAGNOSIS — D45 Polycythemia vera: Secondary | ICD-10-CM

## 2015-10-08 LAB — CBC WITH DIFFERENTIAL (CANCER CENTER ONLY)
BASO#: 0 10*3/uL (ref 0.0–0.2)
BASO%: 0 % (ref 0.0–2.0)
EOS%: 4.2 % (ref 0.0–7.0)
Eosinophils Absolute: 0.2 10*3/uL (ref 0.0–0.5)
HEMATOCRIT: 20.7 % — AB (ref 34.8–46.6)
HEMOGLOBIN: 5 g/dL — AB (ref 11.6–15.9)
LYMPH#: 0.8 10*3/uL — ABNORMAL LOW (ref 0.9–3.3)
LYMPH%: 18.9 % (ref 14.0–48.0)
MCH: 17.1 pg — AB (ref 26.0–34.0)
MCHC: 24.2 g/dL — ABNORMAL LOW (ref 32.0–36.0)
MCV: 71 fL — ABNORMAL LOW (ref 81–101)
MONO#: 0.6 10*3/uL (ref 0.1–0.9)
MONO%: 13.2 % — AB (ref 0.0–13.0)
NEUT%: 63.7 % (ref 39.6–80.0)
NEUTROS ABS: 2.7 10*3/uL (ref 1.5–6.5)
Platelets: 172 10*3/uL (ref 145–400)
RBC: 2.92 10*6/uL — AB (ref 3.70–5.32)
RDW: 20.2 % — ABNORMAL HIGH (ref 11.1–15.7)
WBC: 4.2 10*3/uL (ref 3.9–10.0)

## 2015-10-08 LAB — COMPREHENSIVE METABOLIC PANEL
ALBUMIN: 3.5 g/dL (ref 3.5–5.0)
ALK PHOS: 118 U/L (ref 40–150)
ALT: <9 U/L (ref 0–55)
AST: 14 U/L (ref 5–34)
Anion Gap: 9 mEq/L (ref 3–11)
BILIRUBIN TOTAL: 0.64 mg/dL (ref 0.20–1.20)
BUN: 9.5 mg/dL (ref 7.0–26.0)
CALCIUM: 8.8 mg/dL (ref 8.4–10.4)
CO2: 28 mEq/L (ref 22–29)
Chloride: 103 mEq/L (ref 98–109)
Creatinine: 0.9 mg/dL (ref 0.6–1.1)
EGFR: 63 mL/min/{1.73_m2} — AB (ref >90)
GLUCOSE: 115 mg/dL (ref 70–140)
Potassium: 3.9 mEq/L (ref 3.5–5.1)
SODIUM: 139 meq/L (ref 136–145)
TOTAL PROTEIN: 6.6 g/dL (ref 6.4–8.3)

## 2015-10-08 NOTE — Progress Notes (Signed)
Hematology and Oncology Follow Up Visit  [name] [id number] [birth year] 70 y.o. 10/08/2015   Principle Diagnosis:  Polycythemia vera -- hyperviscosity variant. 2. 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  Current Therapy:    Phlebotomy to maintain hemoglobin below 6.0     Interim History:  Ms.  [name] is back for followup. She was phlebotomized 2 weeks ago. She really felt tired. She felt very sluggish. This is very typical of her when her blood count gets "too high" for her. After she gets phlebotomized, she feels a whole lot better.  She's had a little bit of a headache. She's had no visual problems.  There's no cough. She's had no shortness of breath. She does get fatigued more easily. She is able to cook a very nice dinner last night. This really made her feel better.  She's had no change in bowel or bladder habits.  She's had no leg swelling. She does have fibromyalgia but this is not "flared up".  There's not been any issues with rashes. She's had no fever. She's had no problem with infections. She is prone to UTIs.  She is incredibly iron deficient by nature of her phlebotomies. We have to keep her iron levels down because she will definitely increase her hemoglobin if her iron level started going up. This would be quite risky for her.   Overall, her performance status is ECOG 1.   Medications:Allergies:  Allergies  Allergen Reactions  . Ethrane [Enflurane] Nausea And Vomiting  . Codeine Nausea Only  . Synthroid [Levothyroxine Sodium] Other (See Comments)    Not effective. Causes excessive sleepiness. Takes armour thyroid  . Calcium Channel Blockers Swelling, Palpitations and Hypertension  . Penicillins Rash    Past Medical History, Surgical history, Social history, and Family History were reviewed and updated.  Review of Systems: As above  Physical Exam:  height is 5\' 3"  (1.6 m) and weight is 162 lb (73.483 kg). Her oral  temperature is 97.5 F (36.4 C). Her blood pressure is 109/43 and her pulse is 72. Her respiration is 16.   Well-developed and well-nourished white female in no obvious distress. Head and neck exam shows pale conjunctiva. There is no oral lesions. There is no adenopathy in the neck. Lungs are clear bilaterally. Cardiac exam regular rate and rhythm with a 1/6 systolic ejection murmur. Abdomen is soft. Has good bowel sounds. There is no palpable liver or spleen tip appreciative well-healed laparotomy scars. Back exam no tenderness over the spine ribs or hips. Extremities shows no clubbing cyanosis or edema. Neurological exam shows no focal neurological deficits. Skin exam no rashes, ecchymoses or petechia.  Lab Results  Component Value Date   WBC 4.2 10/08/2015   HGB 5.0* 10/08/2015   HCT 20.7* 10/08/2015   MCV 71* 10/08/2015   PLT 172 10/08/2015     Chemistry      Component Value Date/Time   NA 139 08/13/2015 1318   NA 136 12/04/2014 1502   NA 143 06/05/2014 1312   K 3.9 08/13/2015 1318   K 3.1* 12/04/2014 1502   K 3.3 06/05/2014 1312   CL 95* 12/04/2014 1502   CL 102 06/05/2014 1312   CO2 28 08/13/2015 1318   CO2 30 12/04/2014 1502   CO2 32 06/05/2014 1312   BUN 8.8 08/13/2015 1318   BUN 10 12/04/2014 1502   BUN 6* 06/05/2014 1312   CREATININE 0.8 08/13/2015 1318   CREATININE 0.84 12/04/2014  1502   CREATININE 0.8 06/05/2014 1312      Component Value Date/Time   CALCIUM 8.9 08/13/2015 1318   CALCIUM 9.0 12/04/2014 1502   CALCIUM 8.9 06/05/2014 1312   ALKPHOS 110 08/13/2015 1318   ALKPHOS 97 12/04/2014 1502   ALKPHOS 100* 06/05/2014 1312   AST 12 08/13/2015 1318   AST 14 12/04/2014 1502   AST 22 06/05/2014 1312   ALT <9 08/13/2015 1318   ALT 8 12/04/2014 1502   ALT 9* 06/05/2014 1312   BILITOT 0.61 08/13/2015 1318   BILITOT 0.6 12/04/2014 1502   BILITOT 0.60 06/05/2014 1312         Impression and Plan: [name] is 70 year old female with polycythemia vera. She  is hyperviscous.  Her hemoglobin is holding pretty steady. This is a good sign for my point of view.  She does not need to be phlebotomized right now.  We are following her blood work every 2 weeks.  I'll plan to see her back in another 2 months.   Volanda Napoleon, MD 7/12/20171:43 PM

## 2015-10-09 LAB — IRON AND TIBC
%SAT: 4 % — AB (ref 21–57)
IRON: 17 ug/dL — AB (ref 41–142)
TIBC: 459 ug/dL — ABNORMAL HIGH (ref 236–444)
UIBC: 442 ug/dL — ABNORMAL HIGH (ref 120–384)

## 2015-10-09 LAB — FERRITIN

## 2015-10-20 ENCOUNTER — Other Ambulatory Visit: Payer: Self-pay | Admitting: Hematology & Oncology

## 2015-10-20 DIAGNOSIS — N390 Urinary tract infection, site not specified: Secondary | ICD-10-CM

## 2015-10-22 ENCOUNTER — Other Ambulatory Visit: Payer: Self-pay | Admitting: *Deleted

## 2015-10-22 ENCOUNTER — Other Ambulatory Visit (HOSPITAL_BASED_OUTPATIENT_CLINIC_OR_DEPARTMENT_OTHER): Payer: Medicare Other

## 2015-10-22 DIAGNOSIS — D45 Polycythemia vera: Secondary | ICD-10-CM

## 2015-10-22 LAB — CBC WITH DIFFERENTIAL (CANCER CENTER ONLY)
BASO#: 0 10*3/uL (ref 0.0–0.2)
BASO%: 0.4 % (ref 0.0–2.0)
EOS%: 4.8 % (ref 0.0–7.0)
Eosinophils Absolute: 0.2 10*3/uL (ref 0.0–0.5)
HCT: 22.3 % — ABNORMAL LOW (ref 34.8–46.6)
HGB: 5.4 g/dL — CL (ref 11.6–15.9)
LYMPH#: 0.9 10*3/uL (ref 0.9–3.3)
LYMPH%: 18.6 % (ref 14.0–48.0)
MCH: 17 pg — ABNORMAL LOW (ref 26.0–34.0)
MCHC: 24.2 g/dL — AB (ref 32.0–36.0)
MCV: 70 fL — ABNORMAL LOW (ref 81–101)
MONO#: 0.7 10*3/uL (ref 0.1–0.9)
MONO%: 14.5 % — AB (ref 0.0–13.0)
NEUT#: 2.9 10*3/uL (ref 1.5–6.5)
NEUT%: 61.7 % (ref 39.6–80.0)
PLATELETS: 141 10*3/uL — AB (ref 145–400)
RBC: 3.18 10*6/uL — ABNORMAL LOW (ref 3.70–5.32)
RDW: 20.5 % — AB (ref 11.1–15.7)
WBC: 4.6 10*3/uL (ref 3.9–10.0)

## 2015-10-23 ENCOUNTER — Other Ambulatory Visit: Payer: Self-pay

## 2015-10-23 MED ORDER — ONDANSETRON 4 MG PO TBDP: 4.0000 mg | ORAL_TABLET | Freq: Three times a day (TID) | ORAL | 3 refills | Status: DC | PRN

## 2015-10-30 ENCOUNTER — Other Ambulatory Visit: Payer: Self-pay | Admitting: Family Medicine

## 2015-10-30 NOTE — Telephone Encounter (Signed)
Last seen 06/16/15 and filled 08/29/15 #30 with 1 refill   Please advise    KP

## 2015-11-03 ENCOUNTER — Other Ambulatory Visit: Payer: Self-pay | Admitting: Nurse Practitioner

## 2015-11-03 DIAGNOSIS — I358 Other nonrheumatic aortic valve disorders: Secondary | ICD-10-CM

## 2015-11-03 DIAGNOSIS — M797 Fibromyalgia: Secondary | ICD-10-CM

## 2015-11-03 DIAGNOSIS — D45 Polycythemia vera: Secondary | ICD-10-CM

## 2015-11-03 MED ORDER — OXYCODONE HCL ER 20 MG PO T12A: 20.0000 mg | EXTENDED_RELEASE_TABLET | Freq: Three times a day (TID) | ORAL | 0 refills | Status: DC

## 2015-11-03 MED ORDER — OXYCODONE HCL 15 MG PO TABS: 15.0000 mg | ORAL_TABLET | Freq: Four times a day (QID) | ORAL | 0 refills | Status: DC | PRN

## 2015-11-03 MED ORDER — OXYCODONE HCL ER 10 MG PO T12A: 10.0000 mg | EXTENDED_RELEASE_TABLET | Freq: Two times a day (BID) | ORAL | 0 refills | Status: DC

## 2015-11-04 ENCOUNTER — Other Ambulatory Visit: Payer: Self-pay | Admitting: Family Medicine

## 2015-11-04 NOTE — Telephone Encounter (Signed)
Last seen 06/16/15 and filled 10/30/15 #30 with 1 rf.   KP

## 2015-11-05 ENCOUNTER — Other Ambulatory Visit: Payer: Self-pay

## 2015-11-05 ENCOUNTER — Other Ambulatory Visit (HOSPITAL_BASED_OUTPATIENT_CLINIC_OR_DEPARTMENT_OTHER): Payer: Medicare Other

## 2015-11-05 DIAGNOSIS — D45 Polycythemia vera: Secondary | ICD-10-CM

## 2015-11-05 LAB — CBC WITH DIFFERENTIAL (CANCER CENTER ONLY)
BASO#: 0 10*3/uL (ref 0.0–0.2)
BASO%: 0.2 % (ref 0.0–2.0)
EOS%: 8.8 % — ABNORMAL HIGH (ref 0.0–7.0)
Eosinophils Absolute: 0.4 10*3/uL (ref 0.0–0.5)
HEMATOCRIT: 22.3 % — AB (ref 34.8–46.6)
HGB: 5.5 g/dL — CL (ref 11.6–15.9)
LYMPH#: 0.8 10*3/uL — AB (ref 0.9–3.3)
LYMPH%: 17.8 % (ref 14.0–48.0)
MCH: 17.1 pg — ABNORMAL LOW (ref 26.0–34.0)
MCHC: 24.7 g/dL — AB (ref 32.0–36.0)
MCV: 70 fL — AB (ref 81–101)
MONO#: 0.7 10*3/uL (ref 0.1–0.9)
MONO%: 15.3 % — ABNORMAL HIGH (ref 0.0–13.0)
NEUT#: 2.5 10*3/uL (ref 1.5–6.5)
NEUT%: 57.9 % (ref 39.6–80.0)
Platelets: 160 10*3/uL (ref 145–400)
RBC: 3.21 10*6/uL — ABNORMAL LOW (ref 3.70–5.32)
RDW: 20.3 % — AB (ref 11.1–15.7)
WBC: 4.3 10*3/uL (ref 3.9–10.0)

## 2015-11-10 ENCOUNTER — Other Ambulatory Visit: Payer: Self-pay | Admitting: Hematology & Oncology

## 2015-11-10 DIAGNOSIS — N390 Urinary tract infection, site not specified: Secondary | ICD-10-CM

## 2015-11-19 ENCOUNTER — Telehealth: Payer: Self-pay | Admitting: *Deleted

## 2015-11-19 ENCOUNTER — Other Ambulatory Visit (HOSPITAL_BASED_OUTPATIENT_CLINIC_OR_DEPARTMENT_OTHER): Payer: Medicare Other

## 2015-11-19 ENCOUNTER — Other Ambulatory Visit: Payer: Self-pay | Admitting: *Deleted

## 2015-11-19 DIAGNOSIS — D45 Polycythemia vera: Secondary | ICD-10-CM | POA: Diagnosis present

## 2015-11-19 LAB — CBC WITH DIFFERENTIAL (CANCER CENTER ONLY)
BASO#: 0 10*3/uL (ref 0.0–0.2)
BASO%: 0 % (ref 0.0–2.0)
EOS%: 3 % (ref 0.0–7.0)
Eosinophils Absolute: 0.2 10*3/uL (ref 0.0–0.5)
HEMATOCRIT: 25.1 % — AB (ref 34.8–46.6)
HGB: 6.2 g/dL — CL (ref 11.6–15.9)
LYMPH#: 0.7 10*3/uL — AB (ref 0.9–3.3)
LYMPH%: 13.5 % — ABNORMAL LOW (ref 14.0–48.0)
MCH: 16.7 pg — ABNORMAL LOW (ref 26.0–34.0)
MCHC: 24.7 g/dL — AB (ref 32.0–36.0)
MCV: 68 fL — AB (ref 81–101)
MONO#: 0.7 10*3/uL (ref 0.1–0.9)
MONO%: 13.7 % — ABNORMAL HIGH (ref 0.0–13.0)
NEUT#: 3.5 10*3/uL (ref 1.5–6.5)
NEUT%: 69.8 % (ref 39.6–80.0)
Platelets: 198 10*3/uL (ref 145–400)
RBC: 3.72 10*6/uL (ref 3.70–5.32)
RDW: 20.4 % — AB (ref 11.1–15.7)
WBC: 5 10*3/uL (ref 3.9–10.0)

## 2015-11-19 NOTE — Telephone Encounter (Signed)
Hgb 6.2 reported by Debbie in lab.  Dr. Marin Olp notified.  Patietn set up for phlebotomy tomorrow 11/20/15

## 2015-11-20 ENCOUNTER — Ambulatory Visit (HOSPITAL_BASED_OUTPATIENT_CLINIC_OR_DEPARTMENT_OTHER): Payer: Medicare Other

## 2015-11-20 VITALS — BP 115/44 | HR 64 | Temp 98.2°F | Resp 16

## 2015-11-20 DIAGNOSIS — D45 Polycythemia vera: Secondary | ICD-10-CM | POA: Diagnosis present

## 2015-11-20 NOTE — Patient Instructions (Signed)

## 2015-11-28 ENCOUNTER — Other Ambulatory Visit: Payer: Self-pay

## 2015-11-28 DIAGNOSIS — M797 Fibromyalgia: Secondary | ICD-10-CM

## 2015-11-28 DIAGNOSIS — I358 Other nonrheumatic aortic valve disorders: Secondary | ICD-10-CM

## 2015-11-28 DIAGNOSIS — D45 Polycythemia vera: Secondary | ICD-10-CM

## 2015-12-02 MED ORDER — OXYCODONE HCL 15 MG PO TABS: 15.0000 mg | ORAL_TABLET | Freq: Four times a day (QID) | ORAL | 0 refills | Status: DC | PRN

## 2015-12-02 MED ORDER — OXYCODONE HCL ER 20 MG PO T12A: 20.0000 mg | EXTENDED_RELEASE_TABLET | Freq: Three times a day (TID) | ORAL | 0 refills | Status: DC

## 2015-12-02 MED ORDER — OXYCODONE HCL ER 10 MG PO T12A: 10.0000 mg | EXTENDED_RELEASE_TABLET | Freq: Two times a day (BID) | ORAL | 0 refills | Status: DC

## 2015-12-03 ENCOUNTER — Ambulatory Visit (HOSPITAL_BASED_OUTPATIENT_CLINIC_OR_DEPARTMENT_OTHER): Payer: Medicare Other | Admitting: Hematology & Oncology

## 2015-12-03 ENCOUNTER — Encounter: Payer: Self-pay | Admitting: Hematology & Oncology

## 2015-12-03 ENCOUNTER — Other Ambulatory Visit (HOSPITAL_BASED_OUTPATIENT_CLINIC_OR_DEPARTMENT_OTHER): Payer: Medicare Other

## 2015-12-03 VITALS — BP 117/72 | HR 83 | Temp 97.9°F | Resp 20 | Ht 63.0 in | Wt 162.0 lb

## 2015-12-03 DIAGNOSIS — Z8673 Personal history of transient ischemic attack (TIA), and cerebral infarction without residual deficits: Secondary | ICD-10-CM

## 2015-12-03 DIAGNOSIS — D45 Polycythemia vera: Secondary | ICD-10-CM | POA: Diagnosis not present

## 2015-12-03 LAB — CBC WITH DIFFERENTIAL (CANCER CENTER ONLY)
BASO#: 0 10*3/uL (ref 0.0–0.2)
BASO%: 0.2 % (ref 0.0–2.0)
EOS ABS: 0.3 10*3/uL (ref 0.0–0.5)
EOS%: 5 % (ref 0.0–7.0)
HEMATOCRIT: 22.4 % — AB (ref 34.8–46.6)
HEMOGLOBIN: 5.5 g/dL — AB (ref 11.6–15.9)
LYMPH#: 0.8 10*3/uL — AB (ref 0.9–3.3)
LYMPH%: 15.2 % (ref 14.0–48.0)
MCH: 16.8 pg — AB (ref 26.0–34.0)
MCHC: 24.6 g/dL — AB (ref 32.0–36.0)
MCV: 69 fL — ABNORMAL LOW (ref 81–101)
MONO#: 0.6 10*3/uL (ref 0.1–0.9)
MONO%: 12.5 % (ref 0.0–13.0)
NEUT%: 67.1 % (ref 39.6–80.0)
NEUTROS ABS: 3.4 10*3/uL (ref 1.5–6.5)
Platelets: 184 10*3/uL (ref 145–400)
RBC: 3.27 10*6/uL — AB (ref 3.70–5.32)
RDW: 20.8 % — ABNORMAL HIGH (ref 11.1–15.7)
WBC: 5.1 10*3/uL (ref 3.9–10.0)

## 2015-12-03 LAB — CMP (CANCER CENTER ONLY)
ALBUMIN: 3.5 g/dL (ref 3.3–5.5)
ALT(SGPT): 15 U/L (ref 10–47)
AST: 19 U/L (ref 11–38)
Alkaline Phosphatase: 116 U/L — ABNORMAL HIGH (ref 26–84)
BILIRUBIN TOTAL: 0.9 mg/dL (ref 0.20–1.60)
BUN, Bld: 7 mg/dL (ref 7–22)
CALCIUM: 8.9 mg/dL (ref 8.0–10.3)
CHLORIDE: 99 meq/L (ref 98–108)
CO2: 33 meq/L (ref 18–33)
CREATININE: 0.9 mg/dL (ref 0.6–1.2)
Glucose, Bld: 103 mg/dL (ref 73–118)
Potassium: 2.8 mEq/L — CL (ref 3.3–4.7)
SODIUM: 136 meq/L (ref 128–145)
TOTAL PROTEIN: 7.1 g/dL (ref 6.4–8.1)

## 2015-12-03 NOTE — Progress Notes (Signed)
Hematology and Oncology Follow Up Visit  [name] [id number] December 02, 1945 70 y.o. 12/03/2015   Principle Diagnosis:  Polycythemia vera -- hyperviscosity variant. 2. 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  Current Therapy:    Phlebotomy to maintain hemoglobin below 6.0     Interim History:  [name] is back for followup. She was phlebotomized 2 weeks ago. She really felt tired. She felt very sluggish. This is very typical of her when her blood count gets "too high" for her. After she gets phlebotomized, she feels a whole lot better.  She's had a little bit of a headache. She's had no visual problems.  There's no cough. She's had no shortness of breath. She does get fatigued more easily. She is able to cook a very nice dinner last night. This really made her feel better.  She's had no change in bowel or bladder habits.  She's had no leg swelling. She does have fibromyalgia but this is not "flared up".  There's not been any issues with rashes. She's had no fever. She's had no problem with infections. She is prone to UTIs.  She is incredibly iron deficient by nature of her phlebotomies. We have to keep her iron levels down because she will definitely increase her hemoglobin if her iron level started going up. This would be quite risky for her.   Overall, her performance status is ECOG 1.   Medications:Allergies:  Allergies  Allergen Reactions  . Ethrane [Enflurane] Nausea And Vomiting  . Codeine Nausea Only  . Synthroid [Levothyroxine Sodium] Other (See Comments)    Not effective. Causes excessive sleepiness. Takes armour thyroid  . Calcium Channel Blockers Swelling, Palpitations and Hypertension  . Penicillins Rash    Past Medical History, Surgical history, Social history, and Family History were reviewed and updated.  Review of Systems: As above  Physical Exam:  height is 5\' 3"  (1.6 m) and weight is 162 lb (73.5 kg). Her oral  temperature is 97.9 F (36.6 C). Her blood pressure is 117/72 and her pulse is 83. Her respiration is 20.   Well-developed and well-nourished white female in no obvious distress. Head and neck exam shows pale conjunctiva. There is no oral lesions. There is no adenopathy in the neck. Lungs are clear bilaterally. Cardiac exam regular rate and rhythm with a 1/6 systolic ejection murmur. Abdomen is soft. Has good bowel sounds. There is no palpable liver or spleen tip appreciative well-healed laparotomy scars. Back exam no tenderness over the spine ribs or hips. Extremities shows no clubbing cyanosis or edema. Neurological exam shows no focal neurological deficits. Skin exam no rashes, ecchymoses or petechia.  Lab Results  Component Value Date   WBC 5.1 12/03/2015   HGB 5.5 (LL) 12/03/2015   HCT 22.4 (L) 12/03/2015   MCV 69 (L) 12/03/2015   PLT 184 12/03/2015     Chemistry      Component Value Date/Time   NA 136 12/03/2015 1309   NA 139 10/08/2015 1244   K 2.8 (LL) 12/03/2015 1309   K 3.9 10/08/2015 1244   CL 99 12/03/2015 1309   CO2 33 12/03/2015 1309   CO2 28 10/08/2015 1244   BUN 7 12/03/2015 1309   BUN 9.5 10/08/2015 1244   CREATININE 0.9 12/03/2015 1309   CREATININE 0.9 10/08/2015 1244      Component Value Date/Time   CALCIUM 8.9 12/03/2015 1309   CALCIUM 8.8 10/08/2015 1244   ALKPHOS 116 (H)  12/03/2015 1309   ALKPHOS 118 10/08/2015 1244   AST 19 12/03/2015 1309   AST 14 10/08/2015 1244   ALT 15 12/03/2015 1309   ALT <9 10/08/2015 1244   BILITOT 0.90 12/03/2015 1309   BILITOT 0.64 10/08/2015 1244         Impression and Plan: [name] is 70 year old female with polycythemia vera. She is hyperviscous.  She was phlebotomized a couple weeks ago. She likes to have her hemoglobin below 6 we do think is perfect for her.   We will continue follow her blood counts every 2 weeks.   I told her that I do not see positive her having dental work done. She will need to be  prophylaxed beforehand. She gets clindamycin.   I will plan to see her back in another 2 months.   Sarah Napoleon, MD 9/6/20172:25 PM

## 2015-12-04 LAB — IRON AND TIBC
%SAT: 2 % — ABNORMAL LOW (ref 21–57)
IRON: 11 ug/dL — AB (ref 41–142)
TIBC: 538 ug/dL — AB (ref 236–444)
UIBC: 527 ug/dL — ABNORMAL HIGH (ref 120–384)

## 2015-12-04 LAB — FERRITIN: FERRITIN: 5 ng/mL — AB (ref 9–269)

## 2015-12-09 ENCOUNTER — Ambulatory Visit (INDEPENDENT_AMBULATORY_CARE_PROVIDER_SITE_OTHER): Payer: Medicare Other | Admitting: Family Medicine

## 2015-12-09 ENCOUNTER — Encounter: Payer: Self-pay | Admitting: Family Medicine

## 2015-12-09 ENCOUNTER — Other Ambulatory Visit: Payer: Self-pay

## 2015-12-09 VITALS — BP 120/60 | HR 71 | Ht 63.0 in | Wt 166.4 lb

## 2015-12-09 DIAGNOSIS — E039 Hypothyroidism, unspecified: Secondary | ICD-10-CM

## 2015-12-09 DIAGNOSIS — F411 Generalized anxiety disorder: Secondary | ICD-10-CM

## 2015-12-09 DIAGNOSIS — N393 Stress incontinence (female) (male): Secondary | ICD-10-CM

## 2015-12-09 DIAGNOSIS — I639 Cerebral infarction, unspecified: Secondary | ICD-10-CM

## 2015-12-09 DIAGNOSIS — R600 Localized edema: Secondary | ICD-10-CM

## 2015-12-09 DIAGNOSIS — M62838 Other muscle spasm: Secondary | ICD-10-CM

## 2015-12-09 DIAGNOSIS — R32 Unspecified urinary incontinence: Secondary | ICD-10-CM

## 2015-12-09 DIAGNOSIS — Z0000 Encounter for general adult medical examination without abnormal findings: Secondary | ICD-10-CM

## 2015-12-09 DIAGNOSIS — I10 Essential (primary) hypertension: Secondary | ICD-10-CM

## 2015-12-09 DIAGNOSIS — G4700 Insomnia, unspecified: Secondary | ICD-10-CM

## 2015-12-09 MED ORDER — POTASSIUM CHLORIDE ER 10 MEQ PO CPCR: ORAL_CAPSULE | ORAL | 5 refills | Status: DC

## 2015-12-09 MED ORDER — LEVETIRACETAM 250 MG PO TABS: 250.0000 mg | ORAL_TABLET | Freq: Two times a day (BID) | ORAL | 3 refills | Status: DC

## 2015-12-09 MED ORDER — CYCLOBENZAPRINE HCL 10 MG PO TABS: 5.0000 mg | ORAL_TABLET | Freq: Four times a day (QID) | ORAL | 5 refills | Status: DC | PRN

## 2015-12-09 MED ORDER — FUROSEMIDE 40 MG PO TABS: 40.0000 mg | ORAL_TABLET | Freq: Two times a day (BID) | ORAL | 3 refills | Status: DC

## 2015-12-09 MED ORDER — BISOPROLOL FUMARATE 10 MG PO TABS: 10.0000 mg | ORAL_TABLET | Freq: Every day | ORAL | 3 refills | Status: DC

## 2015-12-09 MED ORDER — TEMAZEPAM 30 MG PO CAPS: ORAL_CAPSULE | ORAL | 1 refills | Status: DC

## 2015-12-09 MED ORDER — FESOTERODINE FUMARATE ER 4 MG PO TB24: 4.0000 mg | ORAL_TABLET | Freq: Every day | ORAL | 2 refills | Status: DC

## 2015-12-09 MED ORDER — ALPRAZOLAM 0.5 MG PO TABS: 0.5000 mg | ORAL_TABLET | Freq: Four times a day (QID) | ORAL | 3 refills | Status: DC | PRN

## 2015-12-09 MED ORDER — NONFORMULARY OR COMPOUNDED ITEM: 0 refills | Status: AC

## 2015-12-09 MED ORDER — HYDROCHLOROTHIAZIDE 12.5 MG PO CAPS: 12.5000 mg | ORAL_CAPSULE | Freq: Every day | ORAL | 3 refills | Status: DC

## 2015-12-09 NOTE — Progress Notes (Signed)
Pre visit review using our clinic review tool, if applicable. No additional management support is needed unless otherwise documented below in the visit note. 

## 2015-12-09 NOTE — Patient Instructions (Signed)
Preventive Care for Adults, Female A healthy lifestyle and preventive care can promote health and wellness. Preventive health guidelines for women include the following key practices.  A routine yearly physical is a good way to check with your health care provider about your health and preventive screening. It is a chance to share any concerns and updates on your health and to receive a thorough exam.  Visit your dentist for a routine exam and preventive care every 6 months. Brush your teeth twice a day and floss once a day. Good oral hygiene prevents tooth decay and gum disease.  The frequency of eye exams is based on your age, health, family medical history, use of contact lenses, and other factors. Follow your health care provider's recommendations for frequency of eye exams.  Eat a healthy diet. Foods like vegetables, fruits, whole grains, low-fat dairy products, and lean protein foods contain the nutrients you need without too many calories. Decrease your intake of foods high in solid fats, added sugars, and salt. Eat the right amount of calories for you.Get information about a proper diet from your health care provider, if necessary.  Regular physical exercise is one of the most important things you can do for your health. Most adults should get at least 150 minutes of moderate-intensity exercise (any activity that increases your heart rate and causes you to sweat) each week. In addition, most adults need muscle-strengthening exercises on 2 or more days a week.  Maintain a healthy weight. The body mass index (BMI) is a screening tool to identify possible weight problems. It provides an estimate of body fat based on height and weight. Your health care provider can find your BMI and can help you achieve or maintain a healthy weight.For adults 20 years and older:  A BMI below 18.5 is considered underweight.  A BMI of 18.5 to 24.9 is normal.  A BMI of 25 to 29.9 is considered overweight.  A  BMI of 30 and above is considered obese.  Maintain normal blood lipids and cholesterol levels by exercising and minimizing your intake of saturated fat. Eat a balanced diet with plenty of fruit and vegetables. Blood tests for lipids and cholesterol should begin at age 45 and be repeated every 5 years. If your lipid or cholesterol levels are high, you are over 50, or you are at high risk for heart disease, you may need your cholesterol levels checked more frequently.Ongoing high lipid and cholesterol levels should be treated with medicines if diet and exercise are not working.  If you smoke, find out from your health care provider how to quit. If you do not use tobacco, do not start.  Lung cancer screening is recommended for adults aged 45-80 years who are at high risk for developing lung cancer because of a history of smoking. A yearly low-dose CT scan of the lungs is recommended for people who have at least a 30-pack-year history of smoking and are a current smoker or have quit within the past 15 years. A pack year of smoking is smoking an average of 1 pack of cigarettes a day for 1 year (for example: 1 pack a day for 30 years or 2 packs a day for 15 years). Yearly screening should continue until the smoker has stopped smoking for at least 15 years. Yearly screening should be stopped for people who develop a health problem that would prevent them from having lung cancer treatment.  If you are pregnant, do not drink alcohol. If you are  breastfeeding, be very cautious about drinking alcohol. If you are not pregnant and choose to drink alcohol, do not have more than 1 drink per day. One drink is considered to be 12 ounces (355 mL) of beer, 5 ounces (148 mL) of wine, or 1.5 ounces (44 mL) of liquor.  Avoid use of street drugs. Do not share needles with anyone. Ask for help if you need support or instructions about stopping the use of drugs.  High blood pressure causes heart disease and increases the risk  of stroke. Your blood pressure should be checked at least every 1 to 2 years. Ongoing high blood pressure should be treated with medicines if weight loss and exercise do not work.  If you are 55-79 years old, ask your health care provider if you should take aspirin to prevent strokes.  Diabetes screening is done by taking a blood sample to check your blood glucose level after you have not eaten for a certain period of time (fasting). If you are not overweight and you do not have risk factors for diabetes, you should be screened once every 3 years starting at age 45. If you are overweight or obese and you are 40-70 years of age, you should be screened for diabetes every year as part of your cardiovascular risk assessment.  Breast cancer screening is essential preventive care for women. You should practice "breast self-awareness." This means understanding the normal appearance and feel of your breasts and may include breast self-examination. Any changes detected, no matter how small, should be reported to a health care provider. Women in their 20s and 30s should have a clinical breast exam (CBE) by a health care provider as part of a regular health exam every 1 to 3 years. After age 40, women should have a CBE every year. Starting at age 40, women should consider having a mammogram (breast X-ray test) every year. Women who have a family history of breast cancer should talk to their health care provider about genetic screening. Women at a high risk of breast cancer should talk to their health care providers about having an MRI and a mammogram every year.  Breast cancer gene (BRCA)-related cancer risk assessment is recommended for women who have family members with BRCA-related cancers. BRCA-related cancers include breast, ovarian, tubal, and peritoneal cancers. Having family members with these cancers may be associated with an increased risk for harmful changes (mutations) in the breast cancer genes BRCA1 and  BRCA2. Results of the assessment will determine the need for genetic counseling and BRCA1 and BRCA2 testing.  Your health care provider may recommend that you be screened regularly for cancer of the pelvic organs (ovaries, uterus, and vagina). This screening involves a pelvic examination, including checking for microscopic changes to the surface of your cervix (Pap test). You may be encouraged to have this screening done every 3 years, beginning at age 21.  For women ages 30-65, health care providers may recommend pelvic exams and Pap testing every 3 years, or they may recommend the Pap and pelvic exam, combined with testing for human papilloma virus (HPV), every 5 years. Some types of HPV increase your risk of cervical cancer. Testing for HPV may also be done on women of any age with unclear Pap test results.  Other health care providers may not recommend any screening for nonpregnant women who are considered low risk for pelvic cancer and who do not have symptoms. Ask your health care provider if a screening pelvic exam is right for   you.  If you have had past treatment for cervical cancer or a condition that could lead to cancer, you need Pap tests and screening for cancer for at least 20 years after your treatment. If Pap tests have been discontinued, your risk factors (such as having a new sexual partner) need to be reassessed to determine if screening should resume. Some women have medical problems that increase the chance of getting cervical cancer. In these cases, your health care provider may recommend more frequent screening and Pap tests.  Colorectal cancer can be detected and often prevented. Most routine colorectal cancer screening begins at the age of 50 years and continues through age 75 years. However, your health care provider may recommend screening at an earlier age if you have risk factors for colon cancer. On a yearly basis, your health care provider may provide home test kits to check  for hidden blood in the stool. Use of a small camera at the end of a tube, to directly examine the colon (sigmoidoscopy or colonoscopy), can detect the earliest forms of colorectal cancer. Talk to your health care provider about this at age 50, when routine screening begins. Direct exam of the colon should be repeated every 5-10 years through age 75 years, unless early forms of precancerous polyps or small growths are found.  People who are at an increased risk for hepatitis B should be screened for this virus. You are considered at high risk for hepatitis B if:  You were born in a country where hepatitis B occurs often. Talk with your health care provider about which countries are considered high risk.  Your parents were born in a high-risk country and you have not received a shot to protect against hepatitis B (hepatitis B vaccine).  You have HIV or AIDS.  You use needles to inject street drugs.  You live with, or have sex with, someone who has hepatitis B.  You get hemodialysis treatment.  You take certain medicines for conditions like cancer, organ transplantation, and autoimmune conditions.  Hepatitis C blood testing is recommended for all people born from 1945 through 1965 and any individual with known risks for hepatitis C.  Practice safe sex. Use condoms and avoid high-risk sexual practices to reduce the spread of sexually transmitted infections (STIs). STIs include gonorrhea, chlamydia, syphilis, trichomonas, herpes, HPV, and human immunodeficiency virus (HIV). Herpes, HIV, and HPV are viral illnesses that have no cure. They can result in disability, cancer, and death.  You should be screened for sexually transmitted illnesses (STIs) including gonorrhea and chlamydia if:  You are sexually active and are younger than 24 years.  You are older than 24 years and your health care provider tells you that you are at risk for this type of infection.  Your sexual activity has changed  since you were last screened and you are at an increased risk for chlamydia or gonorrhea. Ask your health care provider if you are at risk.  If you are at risk of being infected with HIV, it is recommended that you take a prescription medicine daily to prevent HIV infection. This is called preexposure prophylaxis (PrEP). You are considered at risk if:  You are sexually active and do not regularly use condoms or know the HIV status of your partner(s).  You take drugs by injection.  You are sexually active with a partner who has HIV.  Talk with your health care provider about whether you are at high risk of being infected with HIV. If   you choose to begin PrEP, you should first be tested for HIV. You should then be tested every 3 months for as long as you are taking PrEP.  Osteoporosis is a disease in which the bones lose minerals and strength with aging. This can result in serious bone fractures or breaks. The risk of osteoporosis can be identified using a bone density scan. Women ages 67 years and over and women at risk for fractures or osteoporosis should discuss screening with their health care providers. Ask your health care provider whether you should take a calcium supplement or vitamin D to reduce the rate of osteoporosis.  Menopause can be associated with physical symptoms and risks. Hormone replacement therapy is available to decrease symptoms and risks. You should talk to your health care provider about whether hormone replacement therapy is right for you.  Use sunscreen. Apply sunscreen liberally and repeatedly throughout the day. You should seek shade when your shadow is shorter than you. Protect yourself by wearing long sleeves, pants, a wide-brimmed hat, and sunglasses year round, whenever you are outdoors.  Once a month, do a whole body skin exam, using a mirror to look at the skin on your back. Tell your health care provider of new moles, moles that have irregular borders, moles that  are larger than a pencil eraser, or moles that have changed in shape or color.  Stay current with required vaccines (immunizations).  Influenza vaccine. All adults should be immunized every year.  Tetanus, diphtheria, and acellular pertussis (Td, Tdap) vaccine. Pregnant women should receive 1 dose of Tdap vaccine during each pregnancy. The dose should be obtained regardless of the length of time since the last dose. Immunization is preferred during the 27th-36th week of gestation. An adult who has not previously received Tdap or who does not know her vaccine status should receive 1 dose of Tdap. This initial dose should be followed by tetanus and diphtheria toxoids (Td) booster doses every 10 years. Adults with an unknown or incomplete history of completing a 3-dose immunization series with Td-containing vaccines should begin or complete a primary immunization series including a Tdap dose. Adults should receive a Td booster every 10 years.  Varicella vaccine. An adult without evidence of immunity to varicella should receive 2 doses or a second dose if she has previously received 1 dose. Pregnant females who do not have evidence of immunity should receive the first dose after pregnancy. This first dose should be obtained before leaving the health care facility. The second dose should be obtained 4-8 weeks after the first dose.  Human papillomavirus (HPV) vaccine. Females aged 13-26 years who have not received the vaccine previously should obtain the 3-dose series. The vaccine is not recommended for use in pregnant females. However, pregnancy testing is not needed before receiving a dose. If a female is found to be pregnant after receiving a dose, no treatment is needed. In that case, the remaining doses should be delayed until after the pregnancy. Immunization is recommended for any person with an immunocompromised condition through the age of 61 years if she did not get any or all doses earlier. During the  3-dose series, the second dose should be obtained 4-8 weeks after the first dose. The third dose should be obtained 24 weeks after the first dose and 16 weeks after the second dose.  Zoster vaccine. One dose is recommended for adults aged 30 years or older unless certain conditions are present.  Measles, mumps, and rubella (MMR) vaccine. Adults born  before 1957 generally are considered immune to measles and mumps. Adults born in 1957 or later should have 1 or more doses of MMR vaccine unless there is a contraindication to the vaccine or there is laboratory evidence of immunity to each of the three diseases. A routine second dose of MMR vaccine should be obtained at least 28 days after the first dose for students attending postsecondary schools, health care workers, or international travelers. People who received inactivated measles vaccine or an unknown type of measles vaccine during 1963-1967 should receive 2 doses of MMR vaccine. People who received inactivated mumps vaccine or an unknown type of mumps vaccine before 1979 and are at high risk for mumps infection should consider immunization with 2 doses of MMR vaccine. For females of childbearing age, rubella immunity should be determined. If there is no evidence of immunity, females who are not pregnant should be vaccinated. If there is no evidence of immunity, females who are pregnant should delay immunization until after pregnancy. Unvaccinated health care workers born before 1957 who lack laboratory evidence of measles, mumps, or rubella immunity or laboratory confirmation of disease should consider measles and mumps immunization with 2 doses of MMR vaccine or rubella immunization with 1 dose of MMR vaccine.  Pneumococcal 13-valent conjugate (PCV13) vaccine. When indicated, a person who is uncertain of his immunization history and has no record of immunization should receive the PCV13 vaccine. All adults 65 years of age and older should receive this  vaccine. An adult aged 19 years or older who has certain medical conditions and has not been previously immunized should receive 1 dose of PCV13 vaccine. This PCV13 should be followed with a dose of pneumococcal polysaccharide (PPSV23) vaccine. Adults who are at high risk for pneumococcal disease should obtain the PPSV23 vaccine at least 8 weeks after the dose of PCV13 vaccine. Adults older than 72 years of age who have normal immune system function should obtain the PPSV23 vaccine dose at least 1 year after the dose of PCV13 vaccine.  Pneumococcal polysaccharide (PPSV23) vaccine. When PCV13 is also indicated, PCV13 should be obtained first. All adults aged 65 years and older should be immunized. An adult younger than age 65 years who has certain medical conditions should be immunized. Any person who resides in a nursing home or long-term care facility should be immunized. An adult smoker should be immunized. People with an immunocompromised condition and certain other conditions should receive both PCV13 and PPSV23 vaccines. People with human immunodeficiency virus (HIV) infection should be immunized as soon as possible after diagnosis. Immunization during chemotherapy or radiation therapy should be avoided. Routine use of PPSV23 vaccine is not recommended for American Indians, [place] Natives, or people younger than 65 years unless there are medical conditions that require PPSV23 vaccine. When indicated, people who have unknown immunization and have no record of immunization should receive PPSV23 vaccine. One-time revaccination 5 years after the first dose of PPSV23 is recommended for people aged 19-64 years who have chronic kidney failure, nephrotic syndrome, asplenia, or immunocompromised conditions. People who received 1-2 doses of PPSV23 before age 65 years should receive another dose of PPSV23 vaccine at age 65 years or later if at least 5 years have passed since the previous dose. Doses of PPSV23 are not  needed for people immunized with PPSV23 at or after age 65 years.  Meningococcal vaccine. Adults with asplenia or persistent complement component deficiencies should receive 2 doses of quadrivalent meningococcal conjugate (MenACWY-D) vaccine. The doses should be obtained   at least 2 months apart. Microbiologists working with certain meningococcal bacteria, [place] recruits, people at risk during an outbreak, and people who travel to or live in countries with a high rate of meningitis should be immunized. A first-year college student up through age 34 years who is living in a residence hall should receive a dose if she did not receive a dose on or after her 16th birthday. Adults who have certain high-risk conditions should receive one or more doses of vaccine.  Hepatitis A vaccine. Adults who wish to be protected from this disease, have certain high-risk conditions, work with hepatitis A-infected animals, work in hepatitis A research labs, or travel to or work in countries with a high rate of hepatitis A should be immunized. Adults who were previously unvaccinated and who anticipate close contact with an international adoptee during the first 60 days after arrival in the [place] from a country with a high rate of hepatitis A should be immunized.  Hepatitis B vaccine. Adults who wish to be protected from this disease, have certain high-risk conditions, may be exposed to blood or other infectious body fluids, are household contacts or sex partners of hepatitis B positive people, are clients or workers in certain care facilities, or travel to or work in countries with a high rate of hepatitis B should be immunized.  Haemophilus influenzae type b (Hib) vaccine. A previously unvaccinated person with asplenia or sickle cell disease or having a scheduled splenectomy should receive 1 dose of Hib vaccine. Regardless of previous immunization, a recipient of a hematopoietic stem cell transplant should receive a  3-dose series 6-12 months after her successful transplant. Hib vaccine is not recommended for adults with HIV infection. Preventive Services / Frequency Ages 35 to 4 years  Blood pressure check.** / Every 3-5 years.  Lipid and cholesterol check.** / Every 5 years beginning at age 60.  Clinical breast exam.** / Every 3 years for women in their 71s and 10s.  BRCA-related cancer risk assessment.** / For women who have family members with a BRCA-related cancer (breast, ovarian, tubal, or peritoneal cancers).  Pap test.** / Every 2 years from ages 76 through 26. Every 3 years starting at age 61 through age 76 or 93 with a history of 3 consecutive normal Pap tests.  HPV screening.** / Every 3 years from ages 37 through ages 60 to 51 with a history of 3 consecutive normal Pap tests.  Hepatitis C blood test.** / For any individual with known risks for hepatitis C.  Skin self-exam. / Monthly.  Influenza vaccine. / Every year.  Tetanus, diphtheria, and acellular pertussis (Tdap, Td) vaccine.** / Consult your health care provider. Pregnant women should receive 1 dose of Tdap vaccine during each pregnancy. 1 dose of Td every 10 years.  Varicella vaccine.** / Consult your health care provider. Pregnant females who do not have evidence of immunity should receive the first dose after pregnancy.  HPV vaccine. / 3 doses over 6 months, if 93 and younger. The vaccine is not recommended for use in pregnant females. However, pregnancy testing is not needed before receiving a dose.  Measles, mumps, rubella (MMR) vaccine.** / You need at least 1 dose of MMR if you were born in 1957 or later. You may also need a 2nd dose. For females of childbearing age, rubella immunity should be determined. If there is no evidence of immunity, females who are not pregnant should be vaccinated. If there is no evidence of immunity, females who are  pregnant should delay immunization until after pregnancy.  Pneumococcal  13-valent conjugate (PCV13) vaccine.** / Consult your health care provider.  Pneumococcal polysaccharide (PPSV23) vaccine.** / 1 to 2 doses if you smoke cigarettes or if you have certain conditions.  Meningococcal vaccine.** / 1 dose if you are age 68 to 8 years and a Market researcher living in a residence hall, or have one of several medical conditions, you need to get vaccinated against meningococcal disease. You may also need additional booster doses.  Hepatitis A vaccine.** / Consult your health care provider.  Hepatitis B vaccine.** / Consult your health care provider.  Haemophilus influenzae type b (Hib) vaccine.** / Consult your health care provider. Ages 7 to 53 years  Blood pressure check.** / Every year.  Lipid and cholesterol check.** / Every 5 years beginning at age 25 years.  Lung cancer screening. / Every year if you are aged 11-80 years and have a 30-pack-year history of smoking and currently smoke or have quit within the past 15 years. Yearly screening is stopped once you have quit smoking for at least 15 years or develop a health problem that would prevent you from having lung cancer treatment.  Clinical breast exam.** / Every year after age 48 years.  BRCA-related cancer risk assessment.** / For women who have family members with a BRCA-related cancer (breast, ovarian, tubal, or peritoneal cancers).  Mammogram.** / Every year beginning at age 41 years and continuing for as long as you are in good health. Consult with your health care provider.  Pap test.** / Every 3 years starting at age 65 years through age 37 or 70 years with a history of 3 consecutive normal Pap tests.  HPV screening.** / Every 3 years from ages 72 years through ages 60 to 40 years with a history of 3 consecutive normal Pap tests.  Fecal occult blood test (FOBT) of stool. / Every year beginning at age 21 years and continuing until age 5 years. You may not need to do this test if you get  a colonoscopy every 10 years.  Flexible sigmoidoscopy or colonoscopy.** / Every 5 years for a flexible sigmoidoscopy or every 10 years for a colonoscopy beginning at age 35 years and continuing until age 48 years.  Hepatitis C blood test.** / For all people born from [birth year] through 1965 and any individual with known risks for hepatitis C.  Skin self-exam. / Monthly.  Influenza vaccine. / Every year.  Tetanus, diphtheria, and acellular pertussis (Tdap/Td) vaccine.** / Consult your health care provider. Pregnant women should receive 1 dose of Tdap vaccine during each pregnancy. 1 dose of Td every 10 years.  Varicella vaccine.** / Consult your health care provider. Pregnant females who do not have evidence of immunity should receive the first dose after pregnancy.  Zoster vaccine.** / 1 dose for adults aged 30 years or older.  Measles, mumps, rubella (MMR) vaccine.** / You need at least 1 dose of MMR if you were born in 1957 or later. You may also need a second dose. For females of childbearing age, rubella immunity should be determined. If there is no evidence of immunity, females who are not pregnant should be vaccinated. If there is no evidence of immunity, females who are pregnant should delay immunization until after pregnancy.  Pneumococcal 13-valent conjugate (PCV13) vaccine.** / Consult your health care provider.  Pneumococcal polysaccharide (PPSV23) vaccine.** / 1 to 2 doses if you smoke cigarettes or if you have certain conditions.  Meningococcal vaccine.** /  Consult your health care provider.  Hepatitis A vaccine.** / Consult your health care provider.  Hepatitis B vaccine.** / Consult your health care provider.  Haemophilus influenzae type b (Hib) vaccine.** / Consult your health care provider. Ages 64 years and over  Blood pressure check.** / Every year.  Lipid and cholesterol check.** / Every 5 years beginning at age 23 years.  Lung cancer screening. / Every year if you  are aged 16-80 years and have a 30-pack-year history of smoking and currently smoke or have quit within the past 15 years. Yearly screening is stopped once you have quit smoking for at least 15 years or develop a health problem that would prevent you from having lung cancer treatment.  Clinical breast exam.** / Every year after age 74 years.  BRCA-related cancer risk assessment.** / For women who have family members with a BRCA-related cancer (breast, ovarian, tubal, or peritoneal cancers).  Mammogram.** / Every year beginning at age 44 years and continuing for as long as you are in good health. Consult with your health care provider.  Pap test.** / Every 3 years starting at age 58 years through age 22 or 39 years with 3 consecutive normal Pap tests. Testing can be stopped between 65 and 70 years with 3 consecutive normal Pap tests and no abnormal Pap or HPV tests in the past 10 years.  HPV screening.** / Every 3 years from ages 64 years through ages 70 or 61 years with a history of 3 consecutive normal Pap tests. Testing can be stopped between 65 and 70 years with 3 consecutive normal Pap tests and no abnormal Pap or HPV tests in the past 10 years.  Fecal occult blood test (FOBT) of stool. / Every year beginning at age 40 years and continuing until age 27 years. You may not need to do this test if you get a colonoscopy every 10 years.  Flexible sigmoidoscopy or colonoscopy.** / Every 5 years for a flexible sigmoidoscopy or every 10 years for a colonoscopy beginning at age 7 years and continuing until age 32 years.  Hepatitis C blood test.** / For all people born from [birth year] through 1967 and any individual with known risks for hepatitis C.  Osteoporosis screening.** / A one-time screening for women ages 30 years and over and women at risk for fractures or osteoporosis.  Skin self-exam. / Monthly.  Influenza vaccine. / Every year.  Tetanus, diphtheria, and acellular pertussis (Tdap/Td)  vaccine.** / 1 dose of Td every 10 years.  Varicella vaccine.** / Consult your health care provider.  Zoster vaccine.** / 1 dose for adults aged 35 years or older.  Pneumococcal 13-valent conjugate (PCV13) vaccine.** / Consult your health care provider.  Pneumococcal polysaccharide (PPSV23) vaccine.** / 1 dose for all adults aged 46 years and older.  Meningococcal vaccine.** / Consult your health care provider.  Hepatitis A vaccine.** / Consult your health care provider.  Hepatitis B vaccine.** / Consult your health care provider.  Haemophilus influenzae type b (Hib) vaccine.** / Consult your health care provider. ** Family history and personal history of risk and conditions may change your health care provider's recommendations.   This information is not intended to replace advice given to you by your health care provider. Make sure you discuss any questions you have with your health care provider.   Document Released: 05/11/2001 Document Revised: 04/05/2014 Document Reviewed: 08/10/2010 Elsevier Interactive Patient Education [employer].

## 2015-12-09 NOTE — Progress Notes (Signed)
Subjective:   [name] is a 70 y.o. female who presents for Medicare Annual (Subsequent) preventive examination.  Review of Systems:   Review of Systems  Constitutional: Negative for activity change, appetite change  + fatigue  HENT: Negative for hearing loss, congestion, tinnitus and ear discharge.   Eyes: Negative for visual disturbance (see optho q1y -- vision corrected to 20/20 with glasses).  Respiratory: Negative for cough, chest tightness and shortness of breath.   Cardiovascular: Negative for chest pain, palpitations and leg swelling.  Gastrointestinal: Negative for abdominal pain, diarrhea, constipation and abdominal distention.  Genitourinary: Negative for urgency, frequency, decreased urine volume and difficulty urinating.  Musculoskeletal: Negative for back pain, arthralgias and gait problem.  Skin: Negative for color change, pallor and rash.  Neurological: Negative for dizziness, light-headedness, numbness and headaches.  Hematological: Negative for adenopathy. Does not bruise/bleed easily.  Psychiatric/Behavioral: Negative for suicidal ideas, confusion, sleep disturbance, self-injury, dysphoric mood, decreased concentration and agitation.  Pt is able to read and write and can do all ADLs No risk for falling No abuse/ violence in home         Objective:     Vitals: BP 120/60 (BP Location: Right Arm, Patient Position: Sitting, Cuff Size: Small)   Pulse 71   Ht 5\' 3"  (1.6 m)   Wt 166 lb 6.4 oz (75.5 kg)   SpO2 95%   BMI 29.48 kg/m   Body mass index is 29.48 kg/m. BP 120/60 (BP Location: Right Arm, Patient Position: Sitting, Cuff Size: Small)   Pulse 71   Ht 5\' 3"  (1.6 m)   Wt 166 lb 6.4 oz (75.5 kg)   SpO2 95%   BMI 29.48 kg/m  General appearance: alert, cooperative, appears stated age and no distress Head: Normocephalic, without obvious abnormality, atraumatic Eyes: conjunctivae/corneas clear. PERRL, EOM's intact. Fundi benign. Ears: normal TM's and  external ear canals both ears Nose: Nares normal. Septum midline. Mucosa normal. No drainage or sinus tenderness. Throat: lips, mucosa, and tongue normal; teeth and gums normal Neck: no adenopathy, no carotid bruit, no JVD, supple, symmetrical, trachea midline and thyroid not enlarged, symmetric, no tenderness/mass/nodules Back: symmetric, no curvature. ROM normal. No CVA tenderness. Lungs: clear to auscultation bilaterally Breasts: normal appearance, no masses or tenderness Heart: regular rate and rhythm, S1, S2 normal, no murmur, click, rub or gallop Abdomen: soft, non-tender; bowel sounds normal; no masses,  no organomegaly Pelvic: not indicated; status post hysterectomy, negative ROS Extremities: extremities normal, atraumatic, no cyanosis or edema Pulses: 2+ and symmetric Skin: Skin color, texture, turgor normal. No rashes or lesions Lymph nodes: Cervical, supraclavicular, and axillary nodes normal. Neurologic: Alert and oriented X 3, normal strength and tone. Normal symmetric reflexes. Normal coordination and gait Tobacco History  Smoking Status  . Never Smoker  Smokeless Tobacco  . Never Used    Comment: never used tobacco     Counseling given: Not Answered   Past Medical History:  Diagnosis Date  . Acute cholecystitis 10/07/2012   s/p pec drain due to recent CVA, pending chole 11/2012  . Anemia    "due to polycythemia vera" (12/28/2012)  . Anxiety   . Aortic valve sclerosis    by echocardiogram 12/04/2009  . Asthma    "wheeze occasionally; I don't have asthma" (12/28/2012)  . Chronic back pain   . Chronic constipation    takes Mineral Oil,Juice,Enema(prn),and Miralax(Prn) and Cascara nightly  . 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 CVA (cerebral infarction) 10-05-12  rHP, improved, complicated by sz event x 1  . Difficult intubation    grade 3 airway  . Exertional dyspnea   . Fibromyalgia    chronic pain syndrome  . GERD (gastroesophageal reflux  disease)   . Heart murmur    "flow mumur" (12/28/2012)  . History of blood transfusion 09/2012; 12/28/2012  . Hyperlipidemia    "not since carotid OR" (12/28/2012)  . Hypertension    "not since carotid OR" (12/28/2012)  . Hypothyroidism   . Insomnia    takes restoril nightly and Xanax  . 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 Occlusion and stenosis of carotid artery without mention of cerebral infarction 10/26/2012  . PAC (premature atrial contraction)   . 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 PONV (postoperative nausea and vomiting)    "I vomit for 5 days straight w/certain RX w/thanes" (12/28/2012)  . Postmenopausal state    on hormone replacement therapy  . Seasonal allergies    takes Allegra in am and Benadryl at night  . Seizures ([place])    only seizure was 10/14/12 "after carotid endarterectomy";takes Keppra daily  . Stroke [facility]) 09/2012   "after they put drain in my gallbladder"; residual is "haven't felt well enough to tell since stroke to tell; weak already; weaker when I'm tired" (12/28/2012)   Past Surgical History:  Procedure Laterality Date  . ABDOMINAL HYSTERECTOMY  1996  . CAROTID ENDARTERECTOMY Left 10-10-12  . CHOLECYSTECTOMY N/A 11/29/2012   Procedure: ATTEMPTED LAPAROSCOPIC CHOLECYSTECTOMY ;  Surgeon: Gwenyth Ober, MD;  Location: [place];  Service: General;  Laterality: N/A;  . CHOLECYSTECTOMY N/A 11/29/2012   Procedure: CHOLECYSTECTOMY, COMMON BILE DUCT EXPLORATION, T-TUBE PLACEMENT;  Surgeon: Gwenyth Ober, MD;  Location: [place];  Service: General;  Laterality: N/A;  . COLONOSCOPY    . COLONOSCOPY N/A 05/14/2013   Procedure: COLONOSCOPY;  Surgeon: Inda Castle, MD;  Location: [facility];  Service: Endoscopy;  Laterality: N/A;  . ENDARTERECTOMY Left 10/10/2012   Procedure: ENDARTERECTOMY CAROTID- LEFT NECK WITH GREATER SAPHENOUS VEIN PATCH LEFT LEG;  Surgeon: Elam Dutch, MD;  Location: [facility] OR;  Service: Vascular;  Laterality: Left;  .  ESOPHAGOGASTRODUODENOSCOPY N/A 01/02/2013   Procedure: ESOPHAGOGASTRODUODENOSCOPY (EGD);  Surgeon: Inda Castle, MD;  Location: [place];  Service: Endoscopy;  Laterality: N/A;  . insertion of drain  10/02/12   right low abdomen and draining pus  . NASAL SEPTUM SURGERY     late 70's  . PICC line placed     10/15/12  . TONSILLECTOMY     age 68  . TUBAL LIGATION  ~ 1972   Family History  Problem Relation Age of Onset  . Heart attack Father   . Heart disease Father     Heart Disease before age 74  . Coronary artery disease Mother     had aortic valve replacement  . Heart disease Mother   . Heart attack Mother   . Hyperlipidemia Sister   . Hypertension Sister    History  Sexual Activity  . Sexual activity: Not Currently    Outpatient Encounter Prescriptions as of 12/09/2015  Medication Sig  . ALPRAZolam (XANAX) 0.5 MG tablet Take 0.5 mg by mouth every 6 (six) hours as needed for anxiety.  Marland Kitchen aspirin 325 MG tablet Take 325 mg by mouth daily after breakfast. "to thin  Blood"  . bisoprolol (ZEBETA) 10 MG tablet TAKE 1 TABLET BY MOUTH EVERY DAY  . Cascara  Sagrada 450 MG CAPS Take 900 mg by mouth at bedtime.  . clindamycin (CLEOCIN) 300 MG capsule Take 2 capsules by mouth as directed.  . clopidogrel (PLAVIX) 75 MG tablet TAKE 1 TABLET (75 MG TOTAL) BY MOUTH DAILY.  . cyclobenzaprine (FLEXERIL) 10 MG tablet Take 5-10 mg by mouth every 6 (six) hours as needed for muscle spasms.   . diphenhydrAMINE (BENADRYL) 25 MG tablet Take 50 mg by mouth every 8 (eight) hours as needed. Patient may take 50 mg three times a day as needed for allergies  . docusate sodium (COLACE) 100 MG capsule Take 100 mg by mouth 3 (three) times daily with meals.   . famotidine (PEPCID) 20 MG tablet TAKE 1 TABLET BY MOUTH TWICE A DAY BEFORE A MEAL *4/29*  . fexofenadine (ALLEGRA) 180 MG tablet Take 180 mg by mouth daily after breakfast.   . furosemide (LASIX) 40 MG tablet Take 1 tablet (40 mg total) by mouth 2  (two) times daily.  . Garlic Oil 123XX123 MG CAPS Take 1,000 mg by mouth daily after breakfast. Reported on 06/03/2015  . guaiFENesin (MUCINEX) 600 MG 12 hr tablet Take 400 mg by mouth daily.   . hydrochlorothiazide (MICROZIDE) 12.5 MG capsule TAKE ONE CAPSULE BY MOUTH EVERY DAY AS NEEDED IF SYSTOLIC IS GREATER THAN [id number]  . Ipratropium-Albuterol (COMBIVENT RESPIMAT) 20-100 MCG/ACT AERS respimat Inhale 2 puffs into the lungs 4 (four) times daily.  Marland Kitchen levETIRAcetam (KEPPRA) 250 MG tablet Take 250 mg by mouth 2 (two) times daily.  Marland Kitchen lidocaine-prilocaine (EMLA) cream Apply 1 application topically as needed (to numb the phlebotomy site).  Marland Kitchen lisinopril (PRINIVIL,ZESTRIL) 10 MG tablet Take 10 mg by mouth 2 (two) times daily as needed. Reported on 06/03/2015  . magnesium oxide (MAG-OX) 400 MG tablet Take 1,200 mg by mouth daily.  . mineral oil liquid Take 60 mLs by mouth at bedtime. With Juice  . Misc Natural Products (TART CHERRY ADVANCED PO) Take 1,200 mg by mouth 2 (two) times daily. Reported on 06/03/2015  . nitroGLYCERIN (NITROSTAT) 0.4 MG SL tablet Place 0.4 mg under the tongue every 5 (five) minutes as needed for chest pain.  . potassium chloride (MICRO-K) 10 MEQ CR capsule Take 1 tablet by mouth eight times daily  . promethazine (PHENERGAN) 25 MG tablet Take 25 mg by mouth every 6 (six) hours as needed for nausea.  . Sennosides (SENNA LAX PO) Take 1 tablet by mouth 3 (three) times daily after meals.   . temazepam (RESTORIL) 30 MG capsule TAKE 1 CAPSULE BY MOUTH AT BEDTIME AS NEEDED FOR SLEEP  . thyroid (ARMOUR THYROID) 120 MG tablet Take 1 tablet (120 mg total) by mouth daily before breakfast.  . thyroid (ARMOUR THYROID) 30 MG tablet Take 1 tablet (30 mg total) by mouth daily before breakfast.  . triamcinolone cream (KENALOG) 0.1 % Apply 1 application topically 2 (two) times daily as needed (to skin).  . Vitamin D, Cholecalciferol, 1000 UNITS TABS Take 4,000 Units by mouth every morning.  . [DISCONTINUED]  ondansetron (ZOFRAN) 8 MG tablet Take 8 mg by mouth every 8 (eight) hours as needed for nausea or vomiting.   Marland Kitchen oxyCODONE (OXYCONTIN) 10 mg 12 hr tablet Take 1 tablet (10 mg total) by mouth 2 (two) times daily. Take one tablet by mouth at 8am and one tablet by mouth at 4pm  . oxyCODONE (OXYCONTIN) 20 mg 12 hr tablet Take 1 tablet (20 mg total) by mouth 3 (three) times daily. Take 1 tablet by mouth  at 12noon, 8pm and 4am  . oxyCODONE (ROXICODONE) 15 MG immediate release tablet Take 1 tablet (15 mg total) by mouth every 6 (six) hours as needed for pain.  . [DISCONTINUED] cephALEXin (KEFLEX) 500 MG capsule Take 500 mg by mouth as needed (deep chest congestion). Reported on 10/08/2015   No facility-administered encounter medications on file as of 12/09/2015.     Activities of Daily Living In your present state of health, do you have any difficulty performing the following activities: 12/09/2015 06/03/2015  Hearing? N N  Vision? Y Y  Difficulty concentrating or making decisions? Y N  Walking or climbing stairs? N N  Dressing or bathing? N N  Doing errands, shopping? N N  Some recent data might be hidden    Patient Care Team: Ann Held, DO as PCP - General (Family Medicine) Volanda Napoleon, MD as Attending Physician (Hematology) Judeth Horn, MD (General Surgery) Hoyt Koch, MD as Referring Physician (Obstetrics and Gynecology) Marica Otter, OD (Optometry) Druscilla Brownie, MD as Consulting Physician (Dermatology)    Assessment:     Exercise Activities and Dietary recommendations Current Exercise Habits: The patient does not participate in regular exercise at present  Goals    None     Fall Risk Fall Risk  12/09/2015 12/09/2015 12/03/2015 11/20/2015 10/08/2015  Falls in the past year? No No No No No   Depression Screen PHQ 2/9 Scores 12/09/2015 12/09/2015 06/03/2015 06/20/2013  PHQ - 2 Score 0 0 1 0     Cognitive Testing No flowsheet data found.  Immunization History    Administered Date(s) Administered  . Pneumococcal Conjugate-13 06/03/2015  . Pneumococcal Polysaccharide-23 03/29/2002   Screening Tests Health Maintenance  Topic Date Due  . INFLUENZA VACCINE  02/26/2016 (Originally 10/28/2015)  . ZOSTAVAX  06/02/2016 (Originally 08/21/2003)  . TETANUS/TDAP  12/08/2016 (Originally 08/21/1962)  . PNA vac Low Risk Adult (2 of 2 - PPSV23) 06/02/2016  . MAMMOGRAM  06/16/2017  . COLONOSCOPY  05/15/2023  . DEXA SCAN  Completed  . Hepatitis C Screening  Completed      Plan:    see avs During the course of the visit the patient was educated and counseled about the following appropriate screening and preventive services:   Vaccines to include Pneumoccal, Influenza, Hepatitis B, Td, Zostavax, HCV  Electrocardiogram  Cardiovascular Disease  Colorectal cancer screening  Bone density screening  Diabetes screening  Glaucoma screening  Mammography/PAP  Nutrition counseling   Patient Instructions (the written plan) was given to the patient.  1. Hypothyroidism, unspecified hypothyroidism type Check labs  con't meds - NONFORMULARY OR COMPOUNDED ITEM; Cmp, lipid, cbcd , , tsh.magnesium -- dx hypothyroidism, hx cva, hypokalemia,  Dispense: 1 each; Refill: 0  2. Cerebrovascular accident (CVA), unspecified mechanism ([place]) Check labs  - NONFORMULARY OR COMPOUNDED ITEM; Cmp, lipid, cbcd , , tsh.magnesium -- dx hypothyroidism, hx cva, hypokalemia,  Dispense: 1 each; Refill: 0 - levETIRAcetam (KEPPRA) 250 MG tablet; Take 1 tablet (250 mg total) by mouth 2 (two) times daily.  Dispense: 180 tablet; Refill: 3  3. Generalized anxiety disorder stable - ALPRAZolam (XANAX) 0.5 MG tablet; Take 1 tablet (0.5 mg total) by mouth every 6 (six) hours as needed for anxiety.  Dispense: 60 tablet; Refill: 3  4. Muscle spasm  - cyclobenzaprine (FLEXERIL) 10 MG tablet; Take 0.5-1 tablets (5-10 mg total) by mouth every 6 (six) hours as needed for muscle spasms.  Dispense:  30 tablet; Refill: 5  5. Insomnia  - temazepam (RESTORIL)  30 MG capsule; TAKE 1 CAPSULE BY MOUTH AT BEDTIME AS NEEDED FOR SLEEP  Dispense: 90 capsule; Refill: 1  6. Essential hypertension stable - bisoprolol (ZEBETA) 10 MG tablet; Take 1 tablet (10 mg total) by mouth daily.  Dispense: 90 tablet; Refill: 3 - furosemide (LASIX) 40 MG tablet; Take 1 tablet (40 mg total) by mouth 2 (two) times daily.  Dispense: 180 tablet; Refill: 3  7. Bilateral edema of lower extremity Elevate low ext when able - hydrochlorothiazide (MICROZIDE) 12.5 MG capsule; Take 1 capsule (12.5 mg total) by mouth daily.  Dispense: 90 capsule; Refill: 3 - potassium chloride (MICRO-K) 10 MEQ CR capsule; Take 1 tablet by mouth eight times daily  Dispense: 240 capsule; Refill: 5  8. Preventative health care See above Needs mammogram and bmd   9. Incontinence in female Try med--- consider urology - fesoterodine (TOVIAZ) 4 MG TB24 tablet; Take 1 tablet (4 mg total) by mouth daily.  Dispense: 30 tablet; Refill: 2  10. Routine history and physical examination of adult   Ann Held, DO  12/09/2015

## 2015-12-11 ENCOUNTER — Encounter: Payer: Self-pay | Admitting: Family Medicine

## 2015-12-15 ENCOUNTER — Other Ambulatory Visit: Payer: Self-pay | Admitting: Family Medicine

## 2015-12-15 DIAGNOSIS — E039 Hypothyroidism, unspecified: Secondary | ICD-10-CM

## 2015-12-16 ENCOUNTER — Other Ambulatory Visit: Payer: Self-pay

## 2015-12-16 ENCOUNTER — Other Ambulatory Visit: Payer: Self-pay | Admitting: Family Medicine

## 2015-12-16 DIAGNOSIS — D45 Polycythemia vera: Secondary | ICD-10-CM

## 2015-12-16 DIAGNOSIS — E039 Hypothyroidism, unspecified: Secondary | ICD-10-CM

## 2015-12-17 ENCOUNTER — Other Ambulatory Visit: Payer: Medicare Other

## 2015-12-17 ENCOUNTER — Telehealth: Payer: Self-pay | Admitting: Family Medicine

## 2015-12-17 ENCOUNTER — Ambulatory Visit (HOSPITAL_BASED_OUTPATIENT_CLINIC_OR_DEPARTMENT_OTHER): Payer: Medicare Other

## 2015-12-17 ENCOUNTER — Other Ambulatory Visit: Payer: Self-pay | Admitting: Family Medicine

## 2015-12-17 DIAGNOSIS — E785 Hyperlipidemia, unspecified: Secondary | ICD-10-CM | POA: Diagnosis not present

## 2015-12-17 DIAGNOSIS — E038 Other specified hypothyroidism: Secondary | ICD-10-CM | POA: Diagnosis not present

## 2015-12-17 DIAGNOSIS — E039 Hypothyroidism, unspecified: Secondary | ICD-10-CM

## 2015-12-17 DIAGNOSIS — D45 Polycythemia vera: Secondary | ICD-10-CM | POA: Diagnosis present

## 2015-12-17 DIAGNOSIS — R600 Localized edema: Secondary | ICD-10-CM

## 2015-12-17 LAB — CBC WITH DIFFERENTIAL (CANCER CENTER ONLY)
BASO#: 0 10*3/uL (ref 0.0–0.2)
BASO%: 0.3 % (ref 0.0–2.0)
EOS ABS: 0.3 10*3/uL (ref 0.0–0.5)
EOS%: 6.6 % (ref 0.0–7.0)
HCT: 21.5 % — ABNORMAL LOW (ref 34.8–46.6)
HGB: 5.2 g/dL — CL (ref 11.6–15.9)
LYMPH#: 0.7 10*3/uL — ABNORMAL LOW (ref 0.9–3.3)
LYMPH%: 17 % (ref 14.0–48.0)
MCH: 17 pg — AB (ref 26.0–34.0)
MCHC: 24.2 g/dL — AB (ref 32.0–36.0)
MCV: 71 fL — ABNORMAL LOW (ref 81–101)
MONO#: 0.4 10*3/uL (ref 0.1–0.9)
MONO%: 11.1 % (ref 0.0–13.0)
NEUT#: 2.6 10*3/uL (ref 1.5–6.5)
NEUT%: 65 % (ref 39.6–80.0)
Platelets: 117 10*3/uL — ABNORMAL LOW (ref 145–400)
RBC: 3.05 10*6/uL — ABNORMAL LOW (ref 3.70–5.32)
RDW: 21.4 % — ABNORMAL HIGH (ref 11.1–15.7)
WBC: 4 10*3/uL (ref 3.9–10.0)

## 2015-12-17 LAB — COMPREHENSIVE METABOLIC PANEL
ALBUMIN: 3.5 g/dL (ref 3.5–5.0)
ALK PHOS: 111 U/L (ref 40–150)
ANION GAP: 8 meq/L (ref 3–11)
AST: 13 U/L (ref 5–34)
BILIRUBIN TOTAL: 0.65 mg/dL (ref 0.20–1.20)
BUN: 8.2 mg/dL (ref 7.0–26.0)
CO2: 28 meq/L (ref 22–29)
CREATININE: 0.8 mg/dL (ref 0.6–1.1)
Calcium: 8.9 mg/dL (ref 8.4–10.4)
Chloride: 105 mEq/L (ref 98–109)
EGFR: 77 mL/min/{1.73_m2} — AB (ref >90)
GLUCOSE: 110 mg/dL (ref 70–140)
Potassium: 3.6 mEq/L (ref 3.5–5.1)
SODIUM: 141 meq/L (ref 136–145)
TOTAL PROTEIN: 6.6 g/dL (ref 6.4–8.3)

## 2015-12-17 LAB — MAGNESIUM: Magnesium: 2.1 mg/dl (ref 1.5–2.5)

## 2015-12-17 LAB — TSH: TSH: <0.08 m(IU)/L — ABNORMAL LOW (ref 0.308–3.960)

## 2015-12-17 NOTE — Telephone Encounter (Signed)
The Rx was not denied, I called the pharmacy and was told that the patient never picked up the script so it was placed on hold. I tried to call the patient but the line rang busy.

## 2015-12-17 NOTE — Telephone Encounter (Signed)
Caller name: Shanon Brow Relationship to patient: Husband Can be reached: [phone]  Pharmacy:  Reason for call: Husband would like to speak with provider when she returns on Thursday about the denial of patients 30 mg thyroid medication.

## 2015-12-18 ENCOUNTER — Encounter: Payer: Self-pay | Admitting: Family Medicine

## 2015-12-18 ENCOUNTER — Other Ambulatory Visit: Payer: Self-pay | Admitting: Hematology & Oncology

## 2015-12-18 LAB — LIPID PANEL
CHOL/HDL RATIO: 2.6 ratio (ref 0.0–4.4)
Cholesterol, Total: 80 mg/dL — ABNORMAL LOW (ref 100–199)
HDL: 31 mg/dL — ABNORMAL LOW (ref >39)
LDL CALC: 33 mg/dL (ref 0–99)
Triglycerides: 81 mg/dL (ref 0–149)
VLDL Cholesterol Cal: 16 mg/dL (ref 5–40)

## 2015-12-18 MED ORDER — POTASSIUM CHLORIDE ER 10 MEQ PO CPCR: ORAL_CAPSULE | ORAL | 5 refills | Status: DC

## 2015-12-18 NOTE — Telephone Encounter (Addendum)
error:[id number] ° °

## 2015-12-18 NOTE — Telephone Encounter (Signed)
Spoke with Shanon Brow and made him aware of the below information, he verbalized understanding. He stated that Dr.Ennerver advised his wife to take an extra potassium which would be 9 a day for 2 weeks and he wanted to see if you would increase the quantity.   KP

## 2015-12-18 NOTE — Telephone Encounter (Signed)
Ok to increase the number to reflect 9 a day

## 2015-12-18 NOTE — Telephone Encounter (Signed)
I made Shanon Brow aware the Rx has been faxed.     KP

## 2015-12-29 ENCOUNTER — Other Ambulatory Visit: Payer: Self-pay | Admitting: *Deleted

## 2015-12-29 ENCOUNTER — Telehealth: Payer: Self-pay | Admitting: *Deleted

## 2015-12-29 DIAGNOSIS — I358 Other nonrheumatic aortic valve disorders: Secondary | ICD-10-CM

## 2015-12-29 DIAGNOSIS — M797 Fibromyalgia: Secondary | ICD-10-CM

## 2015-12-29 DIAGNOSIS — D45 Polycythemia vera: Secondary | ICD-10-CM

## 2015-12-29 MED ORDER — OXYCODONE HCL 15 MG PO TABS: 15.0000 mg | ORAL_TABLET | Freq: Four times a day (QID) | ORAL | 0 refills | Status: DC | PRN

## 2015-12-29 MED ORDER — OXYCODONE HCL ER 20 MG PO T12A: 20.0000 mg | EXTENDED_RELEASE_TABLET | Freq: Three times a day (TID) | ORAL | 0 refills | Status: DC

## 2015-12-29 MED ORDER — OXYCODONE HCL ER 10 MG PO T12A: 10.0000 mg | EXTENDED_RELEASE_TABLET | Freq: Two times a day (BID) | ORAL | 0 refills | Status: DC

## 2015-12-29 NOTE — Telephone Encounter (Signed)
Patient's husband calling for patient  He would like to know if patient can take St John's Wort and Milk Thistle.   Spoke with Dr Marin Olp and he is fine with patient taking these supplements. Patient's husband aware.

## 2015-12-31 ENCOUNTER — Telehealth: Payer: Self-pay | Admitting: *Deleted

## 2015-12-31 ENCOUNTER — Other Ambulatory Visit (HOSPITAL_BASED_OUTPATIENT_CLINIC_OR_DEPARTMENT_OTHER): Payer: Medicare Other

## 2015-12-31 ENCOUNTER — Other Ambulatory Visit: Payer: Self-pay | Admitting: *Deleted

## 2015-12-31 DIAGNOSIS — D45 Polycythemia vera: Secondary | ICD-10-CM | POA: Diagnosis present

## 2015-12-31 LAB — CBC WITH DIFFERENTIAL (CANCER CENTER ONLY)
BASO#: 0 10*3/uL (ref 0.0–0.2)
BASO%: 0.2 % (ref 0.0–2.0)
EOS ABS: 0.3 10*3/uL (ref 0.0–0.5)
EOS%: 7 % (ref 0.0–7.0)
HEMATOCRIT: 22.9 % — AB (ref 34.8–46.6)
HGB: 5.7 g/dL — CL (ref 11.6–15.9)
LYMPH#: 0.9 10*3/uL (ref 0.9–3.3)
LYMPH%: 18.5 % (ref 14.0–48.0)
MCH: 17.7 pg — AB (ref 26.0–34.0)
MCHC: 24.9 g/dL — AB (ref 32.0–36.0)
MCV: 71 fL — AB (ref 81–101)
MONO#: 0.7 10*3/uL (ref 0.1–0.9)
MONO%: 14.3 % — ABNORMAL HIGH (ref 0.0–13.0)
NEUT#: 2.8 10*3/uL (ref 1.5–6.5)
NEUT%: 60 % (ref 39.6–80.0)
Platelets: 166 10*3/uL (ref 145–400)
RBC: 3.22 10*6/uL — ABNORMAL LOW (ref 3.70–5.32)
RDW: 21.2 % — AB (ref 11.1–15.7)
WBC: 4.7 10*3/uL (ref 3.9–10.0)

## 2015-12-31 LAB — BASIC METABOLIC PANEL - CANCER CENTER ONLY
BUN: 7 mg/dL (ref 7–22)
CALCIUM: 8.8 mg/dL (ref 8.0–10.3)
CO2: 31 mEq/L (ref 18–33)
CREATININE: 0.8 mg/dL (ref 0.6–1.2)
Chloride: 94 mEq/L — ABNORMAL LOW (ref 98–108)
GLUCOSE: 116 mg/dL (ref 73–118)
POTASSIUM: 3.1 meq/L — AB (ref 3.3–4.7)
Sodium: 135 mEq/L (ref 128–145)

## 2015-12-31 NOTE — Telephone Encounter (Signed)
Hgb reported as 5.7 by Debbie in the lab.  Dr. Marin Olp made aware no orders received. K 3.1.  Dr Marin Olp notified.  Stated for patient to take 40 meq and recheck when she comes back.  Patient states she will take 80 meq and cut back on her lasix.  Dr. Marin Olp notified.

## 2016-01-12 ENCOUNTER — Encounter: Payer: Self-pay | Admitting: Family

## 2016-01-14 ENCOUNTER — Other Ambulatory Visit (HOSPITAL_BASED_OUTPATIENT_CLINIC_OR_DEPARTMENT_OTHER): Payer: Medicare Other

## 2016-01-14 DIAGNOSIS — D45 Polycythemia vera: Secondary | ICD-10-CM | POA: Diagnosis present

## 2016-01-14 LAB — CBC WITH DIFFERENTIAL (CANCER CENTER ONLY)
BASO#: 0 10*3/uL (ref 0.0–0.2)
BASO%: 0.2 % (ref 0.0–2.0)
EOS ABS: 0.3 10*3/uL (ref 0.0–0.5)
EOS%: 6 % (ref 0.0–7.0)
HCT: 22.2 % — ABNORMAL LOW (ref 34.8–46.6)
HEMOGLOBIN: 5.5 g/dL — AB (ref 11.6–15.9)
LYMPH#: 0.7 10*3/uL — ABNORMAL LOW (ref 0.9–3.3)
LYMPH%: 16.9 % (ref 14.0–48.0)
MCH: 17.5 pg — AB (ref 26.0–34.0)
MCHC: 24.8 g/dL — ABNORMAL LOW (ref 32.0–36.0)
MCV: 71 fL — AB (ref 81–101)
MONO#: 0.6 10*3/uL (ref 0.1–0.9)
MONO%: 13 % (ref 0.0–13.0)
NEUT%: 63.9 % (ref 39.6–80.0)
NEUTROS ABS: 2.8 10*3/uL (ref 1.5–6.5)
Platelets: 159 10*3/uL (ref 145–400)
RBC: 3.15 10*6/uL — AB (ref 3.70–5.32)
RDW: 21.2 % — ABNORMAL HIGH (ref 11.1–15.7)
WBC: 4.3 10*3/uL (ref 3.9–10.0)

## 2016-01-14 LAB — COMPREHENSIVE METABOLIC PANEL
ALT: <6 U/L (ref 0–55)
AST: 14 U/L (ref 5–34)
Albumin: 3.5 g/dL (ref 3.5–5.0)
Alkaline Phosphatase: 112 U/L (ref 40–150)
Anion Gap: 8 mEq/L (ref 3–11)
BUN: 9.1 mg/dL (ref 7.0–26.0)
CHLORIDE: 102 meq/L (ref 98–109)
CO2: 30 mEq/L — ABNORMAL HIGH (ref 22–29)
Calcium: 8.8 mg/dL (ref 8.4–10.4)
Creatinine: 0.8 mg/dL (ref 0.6–1.1)
EGFR: 69 mL/min/{1.73_m2} — ABNORMAL LOW (ref >90)
Glucose: 115 mg/dl (ref 70–140)
POTASSIUM: 3.5 meq/L (ref 3.5–5.1)
SODIUM: 140 meq/L (ref 136–145)
Total Bilirubin: 0.71 mg/dL (ref 0.20–1.20)
Total Protein: 6.8 g/dL (ref 6.4–8.3)

## 2016-01-15 ENCOUNTER — Encounter: Payer: Self-pay | Admitting: Family

## 2016-01-15 ENCOUNTER — Ambulatory Visit (INDEPENDENT_AMBULATORY_CARE_PROVIDER_SITE_OTHER): Payer: Medicare Other | Admitting: Family

## 2016-01-15 ENCOUNTER — Ambulatory Visit (HOSPITAL_COMMUNITY)
Admission: RE | Admit: 2016-01-15 | Discharge: 2016-01-15 | Disposition: A | Discharge status: Home / Self Care | Payer: Medicare Other | Source: Ambulatory Visit | Attending: Family | Admitting: Family

## 2016-01-15 VITALS — BP 122/56 | HR 66 | Temp 98.1°F | Resp 20 | Ht 63.0 in | Wt 163.0 lb

## 2016-01-15 DIAGNOSIS — I6521 Occlusion and stenosis of right carotid artery: Secondary | ICD-10-CM | POA: Diagnosis not present

## 2016-01-15 DIAGNOSIS — Z48812 Encounter for surgical aftercare following surgery on the circulatory system: Secondary | ICD-10-CM | POA: Insufficient documentation

## 2016-01-15 DIAGNOSIS — Z9889 Other specified postprocedural states: Secondary | ICD-10-CM

## 2016-01-15 DIAGNOSIS — I6523 Occlusion and stenosis of bilateral carotid arteries: Secondary | ICD-10-CM

## 2016-01-15 LAB — IRON AND TIBC
%SAT: 4 % — ABNORMAL LOW (ref 21–57)
IRON: 18 ug/dL — AB (ref 41–142)
TIBC: 501 ug/dL — ABNORMAL HIGH (ref 236–444)
UIBC: 483 ug/dL — ABNORMAL HIGH (ref 120–384)

## 2016-01-15 LAB — FERRITIN: Ferritin: 4 ng/ml — ABNORMAL LOW (ref 9–269)

## 2016-01-15 NOTE — Progress Notes (Signed)
Chief Complaint: Follow up Extracranial Carotid Artery Stenosis   History of Present Illness  [name] is a 70 y.o. female patient of Dr. Oneida Alar who returns for followup today after left carotid endarterectomy October 10, 2012. This was done with a saphenous vein patch. She had a seizure several days after discharge from the hospital. She was readmitted [place] and placed on Keppra; she is concerned about the cost of Keppra. She has recovered since then with no further seizures. She still has some weakness and clumsiness of her right hand.  She states her memory has improved since the left CEA.  Had a stroke in 2001, had a clot that went to left eye and damaged the retina, has some peripheral vision in left eye.  Had a second stroke July 7 or 9, 2014, then had the left CEA., she has had no further stroke or TIA activity. She states that she feels stronger than at her last visit. She states that her blood pressure gets high at times, she takes her blood pressure medication when her pressure is high, as directed by her cardiologist pt reports.  Has mild right sided weakness since the second stroke.  She has no expressive aphasia.   Pt Diabetic: No  Pt smoker: non-smoker   Pt meds include:  Statin : no  ASA: Yes  Other anticoagulants/antiplatelets: Plavix    Past Medical History:  Diagnosis Date  . Acute cholecystitis 10/07/2012   s/p pec drain due to recent CVA, pending chole 11/2012  . Anemia    "due to polycythemia vera" (12/28/2012)  . Anxiety   . Aortic valve sclerosis    by echocardiogram 12/04/2009  . Asthma    "wheeze occasionally; I don't have asthma" (12/28/2012)  . Chronic back pain   . Chronic constipation    takes Mineral Oil,Juice,Enema(prn),and Miralax(Prn) and Cascara nightly  . Complex partial seizure disorder ([place]) 10/15/2012  . CVA (cerebral infarction) 10-05-12   rHP, improved, complicated by sz event x 1  . Difficult intubation    grade 3 airway  . Exertional dyspnea   . Fibromyalgia    chronic pain syndrome  . GERD (gastroesophageal reflux disease)   . Heart murmur    "flow mumur" (12/28/2012)  . History of blood transfusion 09/2012; 12/28/2012  . Hyperlipidemia    "not since carotid OR" (12/28/2012)  . Hypertension    "not since carotid OR" (12/28/2012)  . Hypothyroidism   . Insomnia    takes restoril nightly and Xanax  . 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 Occlusion and stenosis of carotid artery without mention of cerebral infarction 10/26/2012  . PAC (premature atrial contraction)   . 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 PONV (postoperative nausea and vomiting)    "I vomit for 5 days straight w/certain RX w/thanes" (12/28/2012)  . Postmenopausal state    on hormone replacement therapy  . Seasonal allergies    takes Allegra in am and Benadryl at night  . Seizures ([place])    only seizure was 10/14/12 "after carotid endarterectomy";takes Keppra daily  . Stroke [facility]) 09/2012   "after they put drain in my gallbladder"; residual is "haven't felt well enough to tell since stroke to tell; weak already; weaker when I'm tired" (12/28/2012)    Social History Social History  Substance Use Topics  . Smoking status: Never Smoker  . Smokeless tobacco: Never Used     Comment: never used tobacco  .  Alcohol use No    Family History Family History  Problem Relation Age of Onset  . Heart attack Father   . Heart disease Father     Heart Disease before age 40  . Coronary artery disease Mother     had aortic valve replacement  . Heart disease Mother   . Heart attack Mother   . Hyperlipidemia Sister   . Hypertension Sister     Surgical History Past Surgical History:  Procedure Laterality Date  . ABDOMINAL HYSTERECTOMY  1996  . CAROTID ENDARTERECTOMY Left 10-10-12  . CHOLECYSTECTOMY N/A 11/29/2012   Procedure: ATTEMPTED LAPAROSCOPIC CHOLECYSTECTOMY ;  Surgeon: Gwenyth Ober, MD;  Location:  [place];  Hobbs: General;  Laterality: N/A;  . CHOLECYSTECTOMY N/A 11/29/2012   Procedure: CHOLECYSTECTOMY, COMMON BILE DUCT EXPLORATION, T-TUBE PLACEMENT;  Surgeon: Gwenyth Ober, MD;  Location: [place];  Hobbs: General;  Laterality: N/A;  . COLONOSCOPY    . COLONOSCOPY N/A 05/14/2013   Procedure: COLONOSCOPY;  Surgeon: Inda Castle, MD;  Location: [facility] ENDOSCOPY;  Hobbs: Endoscopy;  Laterality: N/A;  . ENDARTERECTOMY Left 10/10/2012   Procedure: ENDARTERECTOMY CAROTID- LEFT NECK WITH GREATER SAPHENOUS VEIN PATCH LEFT LEG;  Surgeon: Elam Dutch, MD;  Location[facility];  Hobbs: Vascular;  Laterality: Left;  . ESOPHAGOGASTRODUODENOSCOPY N/A 01/02/2013   Procedure: ESOPHAGOGASTRODUODENOSCOPY (EGD);  Surgeon: Inda Castle, MD;  Location: [place];  Hobbs: Endoscopy;  Laterality: N/A;  . insertion of drain  10/02/12   right low abdomen and draining pus  . NASAL SEPTUM SURGERY     late 70's  . PICC line placed     10/15/12  . TONSILLECTOMY     age 4  . TUBAL LIGATION  ~ 1972    Allergies  Allergen Reactions  . Ethrane [Enflurane] Nausea And Vomiting  . Codeine Nausea Only  . Synthroid [Levothyroxine Sodium] Other (See Comments)    Not effective. Causes excessive sleepiness. Takes armour thyroid  . Calcium Channel Blockers Swelling, Palpitations and Hypertension  . Penicillins Rash    Current Outpatient Prescriptions  Medication Sig Dispense Refill  . ALPRAZolam (XANAX) 0.5 MG tablet Take 1 tablet (0.5 mg total) by mouth every 6 (six) hours as needed for anxiety. 60 tablet 3  . aspirin 325 MG tablet Take 325 mg by mouth daily after breakfast. "to thin  Blood"    . bisoprolol (ZEBETA) 10 MG tablet Take 1 tablet (10 mg total) by mouth daily. 90 tablet 3  . Cascara [place] 450 MG CAPS Take 900 mg by mouth at bedtime.    . clindamycin (CLEOCIN) 300 MG capsule Take 2 capsules by mouth as directed.    . clopidogrel (PLAVIX) 75 MG tablet TAKE 1 TABLET (75 MG TOTAL) BY MOUTH DAILY.  30 tablet 3  . cyclobenzaprine (FLEXERIL) 10 MG tablet Take 0.5-1 tablets (5-10 mg total) by mouth every 6 (six) hours as needed for muscle spasms. 30 tablet 5  . diphenhydrAMINE (BENADRYL) 25 MG tablet Take 50 mg by mouth every 8 (eight) hours as needed. Patient may take 50 mg three times a day as needed for allergies    . docusate sodium (COLACE) 100 MG capsule Take 100 mg by mouth 3 (three) times daily with meals.     . famotidine (PEPCID) 20 MG tablet TAKE 1 TABLET BY MOUTH TWICE A DAY BEFORE A MEAL *4/29* 180 tablet 3  . fesoterodine (TOVIAZ) 4 MG TB24 tablet Take 1 tablet (4 mg total) by mouth daily. [place]  tablet 2  . fexofenadine (ALLEGRA) 180 MG tablet Take 180 mg by mouth daily after breakfast.     . furosemide (LASIX) 40 MG tablet Take 1 tablet (40 mg total) by mouth 2 (two) times daily. 180 tablet 3  . Garlic Oil 123XX123 MG CAPS Take 1,000 mg by mouth daily after breakfast. Reported on 06/03/2015    . guaiFENesin (MUCINEX) 600 MG 12 hr tablet Take 400 mg by mouth daily.     . hydrochlorothiazide (MICROZIDE) 12.5 MG capsule Take 1 capsule (12.5 mg total) by mouth daily. 90 capsule 3  . Ipratropium-Albuterol (COMBIVENT RESPIMAT) 20-100 MCG/ACT AERS respimat Inhale 2 puffs into the lungs 4 (four) times daily.    Marland Kitchen levETIRAcetam (KEPPRA) 250 MG tablet Take 1 tablet (250 mg total) by mouth 2 (two) times daily. 180 tablet 3  . lidocaine-prilocaine (EMLA) cream Apply 1 application topically as needed (to numb the phlebotomy site). 30 g 6  . lisinopril (PRINIVIL,ZESTRIL) 10 MG tablet Take 10 mg by mouth 2 (two) times daily as needed. Reported on 06/03/2015    . magnesium oxide (MAG-OX) 400 MG tablet Take 1,200 mg by mouth daily.    . mineral oil liquid Take 60 mLs by mouth at bedtime. With Juice    . Misc Natural Products (TART CHERRY ADVANCED PO) Take 1,200 mg by mouth 2 (two) times daily. Reported on 06/03/2015    . nitroGLYCERIN (NITROSTAT) 0.4 MG SL tablet Place 0.4 mg under the tongue every 5 (five)  minutes as needed for chest pain.    . NONFORMULARY OR COMPOUNDED ITEM Cmp, lipid, cbcd , , tsh.magnesium -- dx hypothyroidism, hx cva, hypokalemia, 1 each 0  . ondansetron (ZOFRAN) 4 MG tablet Take 4 mg by mouth every 8 (eight) hours as needed for nausea or vomiting.    Marland Kitchen oxyCODONE (OXYCONTIN) 10 mg 12 hr tablet Take 1 tablet (10 mg total) by mouth 2 (two) times daily. Take one tablet by mouth at 8am and one tablet by mouth at 4pm 60 tablet 0  . oxyCODONE (OXYCONTIN) 20 mg 12 hr tablet Take 1 tablet (20 mg total) by mouth 3 (three) times daily. Take 1 tablet by mouth at 12noon, 8pm and 4am 90 tablet 0  . oxyCODONE (ROXICODONE) 15 MG immediate release tablet Take 1 tablet (15 mg total) by mouth every 6 (six) hours as needed for pain. 30 tablet 0  . potassium chloride (MICRO-K) 10 MEQ CR capsule Take 1 tablet by mouth 9 times a day for 2 weeks and then 1 tablet by mouth eight times daily 254 capsule 5  . promethazine (PHENERGAN) 25 MG tablet Take 25 mg by mouth every 6 (six) hours as needed for nausea.    . Sennosides (SENNA LAX PO) Take 1 tablet by mouth 3 (three) times daily after meals.     . temazepam (RESTORIL) 30 MG capsule TAKE 1 CAPSULE BY MOUTH AT BEDTIME AS NEEDED FOR SLEEP 90 capsule 1  . thyroid (ARMOUR) 120 MG tablet Take 150 mg by mouth daily before breakfast.    . triamcinolone cream (KENALOG) 0.1 % Apply 1 application topically 2 (two) times daily as needed (to skin).    . Vitamin D, Cholecalciferol, 1000 UNITS TABS Take 4,000 Units by mouth every morning.     No current facility-administered medications for this visit.     Review of Systems : See HPI for pertinent positives and negatives.  Physical Examination  Vitals:   01/15/16 1155 01/15/16 1201  BP: 120/60 Marland Kitchen)  122/56  Pulse: 66   Resp: 20   Temp: 98.1 F (36.7 C)   TempSrc: Oral   SpO2: 100%   Weight: 163 lb (73.9 kg)   Height: 5\' 3"  (1.6 m)    Body mass index is 28.87 kg/m.  General:WDWN female in NAD  GAIT:  normal  Eyes: PERRLA  Pulmonary: Respirations are non-labored, CTAB, no rales, rhonchi, or wheezing. Decreased air movement in right base.  Cardiac: regular rhythm and rate, positive  murmur.   VASCULAR EXAM  Carotid Bruits  Left  Right    tranasmitted cardiac murmur Transmitted cardiac murmur  Aorta is not palpable.  Radial pulses are 1+ palpable and equal.   LE Pulses  LEFT  RIGHT   POPLITEAL  not palpable  not palpable   POSTERIOR TIBIAL  Faintly palpable  Faintly palpable   DORSALIS PEDIS  ANTERIOR TIBIAL  Faintly palpable  Faintly palpable    Gastrointestinal: no tenderness, no masses palpated.  Musculoskeletal: No muscle atrophy/wasting. M/S 4/5 throughout, Extremities without ischemic changes. 1+ non pitting edema in both ankles and feet. Neurologic: A&O X 3; Appropriate Affect,  Speech is normal  CN 2-12 except mildly slow expression of speech, Pain and light touch intact in extremities, Motor exam as listed above.    Assessment: DEBBE AFABLE is a 70 y.o. female who is s/p  left carotid endarterectomy on October 10, 2012. She had a stroke in 2001, an embolus that went to left eye and damaged the retina, has some peripheral vision in left eye.  She had a second stroke July 7 or 9, 2014, then had the left CEA., she has had no further stroke or TIA activity.  DATA Today's carotid Duplex suggests 40 - 59% stenosis in the right internal carotid artery, with no plaque visualized and a patent left carotid endarterectomy site with no evidence of restenosis. No significant change since prior exam of 11/14/14.   Plan: Follow-up in 1 year with Carotid Duplex scan.   I discussed in depth with the patient the nature of atherosclerosis, and emphasized the importance of maximal medical management including strict control of blood pressure, blood glucose, and lipid levels, obtaining regular exercise, and continued cessation of smoking.  The patient is aware  that without maximal medical management the underlying atherosclerotic disease process will progress, limiting the benefit of any interventions. The patient was given information about stroke prevention and what symptoms should prompt the patient to seek immediate medical care. Thank you for allowing [place] to participate in this patient's care.  Clemon Chambers, RN, MSN, FNP-C [facility] Office: [phone]  Clinic Physician: Oneida Alar  01/15/16 12:31 PM

## 2016-01-15 NOTE — Patient Instructions (Signed)
Stroke Prevention Some medical conditions and behaviors are associated with an increased chance of having a stroke. You may prevent a stroke by making healthy choices and managing medical conditions. HOW CAN I REDUCE MY RISK OF HAVING A STROKE?   Stay physically active. Get at least 30 minutes of activity on most or all days.  Do not smoke. It may also be helpful to avoid exposure to secondhand smoke.  Limit alcohol use. Moderate alcohol use is considered to be:  No more than 2 drinks per day for men.  No more than 1 drink per day for nonpregnant women.  Eat healthy foods. This involves:  Eating 5 or more servings of fruits and vegetables a day.  Making dietary changes that address high blood pressure (hypertension), high cholesterol, diabetes, or obesity.  Manage your cholesterol levels.  Making food choices that are high in fiber and low in saturated fat, trans fat, and cholesterol may control cholesterol levels.  Take any prescribed medicines to control cholesterol as directed by your health care provider.  Manage your diabetes.  Controlling your carbohydrate and sugar intake is recommended to manage diabetes.  Take any prescribed medicines to control diabetes as directed by your health care provider.  Control your hypertension.  Making food choices that are low in salt (sodium), saturated fat, trans fat, and cholesterol is recommended to manage hypertension.  Ask your health care provider if you need treatment to lower your blood pressure. Take any prescribed medicines to control hypertension as directed by your health care provider.  If you are 18-39 years of age, have your blood pressure checked every 3-5 years. If you are 40 years of age or older, have your blood pressure checked every year.  Maintain a healthy weight.  Reducing calorie intake and making food choices that are low in sodium, saturated fat, trans fat, and cholesterol are recommended to manage  weight.  Stop drug abuse.  Avoid taking birth control pills.  Talk to your health care provider about the risks of taking birth control pills if you are over 35 years old, smoke, get migraines, or have ever had a blood clot.  Get evaluated for sleep disorders (sleep apnea).  Talk to your health care provider about getting a sleep evaluation if you snore a lot or have excessive sleepiness.  Take medicines only as directed by your health care provider.  For some people, aspirin or blood thinners (anticoagulants) are helpful in reducing the risk of forming abnormal blood clots that can lead to stroke. If you have the irregular heart rhythm of atrial fibrillation, you should be on a blood thinner unless there is a good reason you cannot take them.  Understand all your medicine instructions.  Make sure that other conditions (such as anemia or atherosclerosis) are addressed. SEEK IMMEDIATE MEDICAL CARE IF:   You have sudden weakness or numbness of the face, arm, or leg, especially on one side of the body.  Your face or eyelid droops to one side.  You have sudden confusion.  You have trouble speaking (aphasia) or understanding.  You have sudden trouble seeing in one or both eyes.  You have sudden trouble walking.  You have dizziness.  You have a loss of balance or coordination.  You have a sudden, severe headache with no known cause.  You have new chest pain or an irregular heartbeat. Any of these symptoms may represent a serious problem that is an emergency. Do not wait to see if the symptoms will   go away. Get medical help at once. Call your local emergency services (911 in [place] Do not drive yourself to the hospital.   This information is not intended to replace advice given to you by your health care provider. Make sure you discuss any questions you have with your health care provider.   Document Released: 04/22/2004 Document Revised: 04/05/2014 Document Reviewed:  09/15/2012 Elsevier Interactive Patient Education 2016 Elsevier Inc.  

## 2016-01-20 ENCOUNTER — Other Ambulatory Visit: Payer: Self-pay | Admitting: *Deleted

## 2016-01-20 DIAGNOSIS — D45 Polycythemia vera: Secondary | ICD-10-CM

## 2016-01-21 ENCOUNTER — Other Ambulatory Visit: Payer: Self-pay | Admitting: *Deleted

## 2016-01-21 ENCOUNTER — Other Ambulatory Visit (HOSPITAL_BASED_OUTPATIENT_CLINIC_OR_DEPARTMENT_OTHER): Payer: Medicare Other

## 2016-01-21 ENCOUNTER — Telehealth: Payer: Self-pay | Admitting: *Deleted

## 2016-01-21 DIAGNOSIS — I119 Hypertensive heart disease without heart failure: Secondary | ICD-10-CM | POA: Diagnosis not present

## 2016-01-21 DIAGNOSIS — D45 Polycythemia vera: Secondary | ICD-10-CM

## 2016-01-21 LAB — COMPREHENSIVE METABOLIC PANEL
ALBUMIN: 3.5 g/dL (ref 3.5–5.0)
ALK PHOS: 116 U/L (ref 40–150)
ALT: 6 U/L (ref 0–55)
AST: 12 U/L (ref 5–34)
Anion Gap: 7 mEq/L (ref 3–11)
BUN: 9.7 mg/dL (ref 7.0–26.0)
CO2: 30 mEq/L — ABNORMAL HIGH (ref 22–29)
CREATININE: 0.7 mg/dL (ref 0.6–1.1)
Calcium: 9.1 mg/dL (ref 8.4–10.4)
Chloride: 107 mEq/L (ref 98–109)
EGFR: 81 mL/min/{1.73_m2} — ABNORMAL LOW (ref >90)
GLUCOSE: 108 mg/dL (ref 70–140)
POTASSIUM: 3.8 meq/L (ref 3.5–5.1)
SODIUM: 143 meq/L (ref 136–145)
Total Bilirubin: 0.61 mg/dL (ref 0.20–1.20)
Total Protein: 6.7 g/dL (ref 6.4–8.3)

## 2016-01-21 LAB — CBC WITH DIFFERENTIAL (CANCER CENTER ONLY)
BASO#: 0 10*3/uL (ref 0.0–0.2)
BASO%: 0.5 % (ref 0.0–2.0)
EOS%: 6.4 % (ref 0.0–7.0)
Eosinophils Absolute: 0.2 10*3/uL (ref 0.0–0.5)
HCT: 22.6 % — ABNORMAL LOW (ref 34.8–46.6)
HEMOGLOBIN: 5.6 g/dL — AB (ref 11.6–15.9)
LYMPH#: 0.5 10*3/uL — AB (ref 0.9–3.3)
LYMPH%: 14.1 % (ref 14.0–48.0)
MCH: 17.4 pg — ABNORMAL LOW (ref 26.0–34.0)
MCHC: 24.8 g/dL — AB (ref 32.0–36.0)
MCV: 70 fL — ABNORMAL LOW (ref 81–101)
MONO#: 0.3 10*3/uL (ref 0.1–0.9)
MONO%: 8.8 % (ref 0.0–13.0)
NEUT%: 70.2 % (ref 39.6–80.0)
NEUTROS ABS: 2.6 10*3/uL (ref 1.5–6.5)
Platelets: 160 10*3/uL (ref 145–400)
RBC: 3.21 10*6/uL — AB (ref 3.70–5.32)
RDW: 20.7 % — ABNORMAL HIGH (ref 11.1–15.7)
WBC: 3.8 10*3/uL — AB (ref 3.9–10.0)

## 2016-01-21 LAB — MAGNESIUM: MAGNESIUM: 2.2 mg/dL (ref 1.5–2.5)

## 2016-01-21 NOTE — Telephone Encounter (Signed)
Critical Value HGB 5.6 Laverna Peace NP notified. No orders at this time.

## 2016-01-22 LAB — LIPID PANEL
CHOLESTEROL TOTAL: 78 mg/dL — AB (ref 100–199)
Chol/HDL Ratio: 2.4 ratio units (ref 0.0–4.4)
HDL: 33 mg/dL — ABNORMAL LOW (ref >39)
LDL CALC: 34 mg/dL (ref 0–99)
Triglycerides: 54 mg/dL (ref 0–149)
VLDL CHOLESTEROL CAL: 11 mg/dL (ref 5–40)

## 2016-01-23 ENCOUNTER — Other Ambulatory Visit: Payer: Self-pay | Admitting: Hematology & Oncology

## 2016-01-23 DIAGNOSIS — N390 Urinary tract infection, site not specified: Secondary | ICD-10-CM

## 2016-01-27 ENCOUNTER — Other Ambulatory Visit: Payer: Self-pay | Admitting: *Deleted

## 2016-01-27 DIAGNOSIS — I358 Other nonrheumatic aortic valve disorders: Secondary | ICD-10-CM

## 2016-01-27 DIAGNOSIS — D45 Polycythemia vera: Secondary | ICD-10-CM

## 2016-01-27 DIAGNOSIS — M797 Fibromyalgia: Secondary | ICD-10-CM

## 2016-01-27 MED ORDER — OXYCODONE HCL ER 20 MG PO T12A: 20.0000 mg | EXTENDED_RELEASE_TABLET | Freq: Three times a day (TID) | ORAL | 0 refills | Status: DC

## 2016-01-27 MED ORDER — OXYCODONE HCL 15 MG PO TABS: 15.0000 mg | ORAL_TABLET | Freq: Four times a day (QID) | ORAL | 0 refills | Status: DC | PRN

## 2016-01-27 MED ORDER — OXYCODONE HCL ER 10 MG PO T12A
10.0000 mg | EXTENDED_RELEASE_TABLET | Freq: Two times a day (BID) | ORAL | 0 refills | Status: DC
Start: 2016-01-27 — End: 2016-02-23

## 2016-01-28 ENCOUNTER — Ambulatory Visit (HOSPITAL_BASED_OUTPATIENT_CLINIC_OR_DEPARTMENT_OTHER): Payer: Medicare Other | Admitting: Hematology & Oncology

## 2016-01-28 ENCOUNTER — Other Ambulatory Visit (HOSPITAL_BASED_OUTPATIENT_CLINIC_OR_DEPARTMENT_OTHER): Payer: Medicare Other

## 2016-01-28 ENCOUNTER — Other Ambulatory Visit: Payer: Medicare Other

## 2016-01-28 VITALS — BP 129/41 | HR 81 | Temp 98.0°F | Resp 20 | Wt 165.1 lb

## 2016-01-28 DIAGNOSIS — D45 Polycythemia vera: Secondary | ICD-10-CM | POA: Diagnosis not present

## 2016-01-28 DIAGNOSIS — Z8673 Personal history of transient ischemic attack (TIA), and cerebral infarction without residual deficits: Secondary | ICD-10-CM | POA: Diagnosis not present

## 2016-01-28 LAB — CBC WITH DIFFERENTIAL (CANCER CENTER ONLY)
BASO#: 0 10*3/uL (ref 0.0–0.2)
BASO%: 0.2 % (ref 0.0–2.0)
EOS%: 11.1 % — AB (ref 0.0–7.0)
Eosinophils Absolute: 0.6 10*3/uL — ABNORMAL HIGH (ref 0.0–0.5)
HEMATOCRIT: 23.3 % — AB (ref 34.8–46.6)
HEMOGLOBIN: 5.9 g/dL — AB (ref 11.6–15.9)
LYMPH#: 1 10*3/uL (ref 0.9–3.3)
LYMPH%: 18.3 % (ref 14.0–48.0)
MCH: 17.8 pg — ABNORMAL LOW (ref 26.0–34.0)
MCHC: 25.3 g/dL — ABNORMAL LOW (ref 32.0–36.0)
MCV: 70 fL — ABNORMAL LOW (ref 81–101)
MONO#: 0.7 10*3/uL (ref 0.1–0.9)
MONO%: 12.4 % (ref 0.0–13.0)
NEUT%: 58 % (ref 39.6–80.0)
NEUTROS ABS: 3.2 10*3/uL (ref 1.5–6.5)
Platelets: 192 10*3/uL (ref 145–400)
RBC: 3.31 10*6/uL — ABNORMAL LOW (ref 3.70–5.32)
RDW: 20.1 % — ABNORMAL HIGH (ref 11.1–15.7)
WBC: 5.6 10*3/uL (ref 3.9–10.0)

## 2016-01-28 NOTE — Progress Notes (Signed)
Hematology and Oncology Follow Up Visit  [name] [id number] Sep 04, 1945 70 y.o. 01/28/2016   Principle Diagnosis:  Polycythemia vera -- hyperviscosity variant. 2. 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  Current Therapy:    Phlebotomy to maintain hemoglobin below 6.0     Interim History:  [name] is back for followup. She was phlebotomized 10 weeks ago. She really felt tired. She felt very sluggish. This is very typical of her when her blood count gets "too high" for her. After she gets phlebotomized, she feels a whole lot better.  She's had a little bit of a headache. She's had no visual problems.  There's no cough. She's had no shortness of breath. She does get fatigued more easily. She is able to cook a very nice dinner last night. This really made her feel better.  She's had no change in bowel or bladder habits.  She's had no leg swelling. She does have fibromyalgia but this is not "flared up".  There's not been any issues with rashes. She's had no fever. She's had no problem with infections. She is prone to UTIs.  She is incredibly iron deficient by nature of her phlebotomies. We have to keep her iron levels down because she will definitely increase her hemoglobin if her iron level started going up. This would be quite risky for her.   Overall, her performance status is ECOG 1.   Medications:Allergies:  Allergies  Allergen Reactions  . Ethrane [Enflurane] Nausea And Vomiting  . Codeine Nausea Only  . Synthroid [Levothyroxine Sodium] Other (See Comments)    Not effective. Causes excessive sleepiness. Takes armour thyroid  . Calcium Channel Blockers Swelling, Palpitations and Hypertension  . Penicillins Rash    Past Medical History, Surgical history, Social history, and Family History were reviewed and updated.  Review of Systems: As above  Physical Exam:  weight is 165 lb 1.9 oz (74.9 kg). Her oral temperature is 98 F  (36.7 C). Her blood pressure is 129/41 (abnormal) and her pulse is 81. Her respiration is 20.   Well-developed and well-nourished white female in no obvious distress. Head and neck exam shows pale conjunctiva. There is no oral lesions. There is no adenopathy in the neck. Lungs are clear bilaterally. Cardiac exam regular rate and rhythm with a 1/6 systolic ejection murmur. Abdomen is soft. Has good bowel sounds. There is no palpable liver or spleen tip appreciative well-healed laparotomy scars. Back exam no tenderness over the spine ribs or hips. Extremities shows no clubbing cyanosis or edema. Neurological exam shows no focal neurological deficits. Skin exam no rashes, ecchymoses or petechia.  Lab Results  Component Value Date   WBC 5.6 01/28/2016   HGB 5.9 (LL) 01/28/2016   HCT 23.3 (L) 01/28/2016   MCV 70 (L) 01/28/2016   PLT 192 01/28/2016     Chemistry      Component Value Date/Time   NA 143 01/21/2016 1114   K 3.8 01/21/2016 1114   CL 94 (L) 12/31/2015 1315   CO2 30 (H) 01/21/2016 1114   BUN 9.7 01/21/2016 1114   CREATININE 0.7 01/21/2016 1114      Component Value Date/Time   CALCIUM 9.1 01/21/2016 1114   ALKPHOS 116 01/21/2016 1114   AST 12 01/21/2016 1114   ALT 6 01/21/2016 1114   BILITOT 0.61 01/21/2016 1114         Impression and Plan: [name] is 70 year old female with polycythemia vera. She  is hyperviscous.  She will be phlebotomized this week. It is been about 10 weeks she has been phlebotomized. This is a good interval for her.   We will continue to follow her along and check her blood counts every 2 weeks.   I will see her back in another 6 weeks    . Volanda Napoleon, MD 11/1/20173:25 PM

## 2016-01-29 ENCOUNTER — Ambulatory Visit (HOSPITAL_BASED_OUTPATIENT_CLINIC_OR_DEPARTMENT_OTHER): Payer: Medicare Other

## 2016-01-29 ENCOUNTER — Other Ambulatory Visit: Payer: Self-pay | Admitting: Family

## 2016-01-29 VITALS — BP 131/46 | HR 70 | Temp 97.6°F | Resp 20

## 2016-01-29 DIAGNOSIS — D45 Polycythemia vera: Secondary | ICD-10-CM

## 2016-01-29 NOTE — Patient Instructions (Signed)

## 2016-01-29 NOTE — Progress Notes (Signed)
[name] presents today for phlebotomy per MD orders. Phlebotomy procedure started at 1448 and ended at 1453 Approximately 500 grams removed. Patient observed for 30 minutes after procedure without any incident. Patient tolerated procedure well. IV needle removed intact.

## 2016-02-10 NOTE — Addendum Note (Signed)
Addended by: Mena Goes on: 02/10/2016 04:15 PM   Modules accepted: Orders

## 2016-02-11 ENCOUNTER — Telehealth: Payer: Self-pay | Admitting: *Deleted

## 2016-02-11 ENCOUNTER — Other Ambulatory Visit (HOSPITAL_BASED_OUTPATIENT_CLINIC_OR_DEPARTMENT_OTHER): Payer: Medicare Other

## 2016-02-11 DIAGNOSIS — D45 Polycythemia vera: Secondary | ICD-10-CM | POA: Diagnosis present

## 2016-02-11 LAB — CBC WITH DIFFERENTIAL (CANCER CENTER ONLY)
BASO#: 0 10*3/uL (ref 0.0–0.2)
BASO%: 0.2 % (ref 0.0–2.0)
EOS%: 4.8 % (ref 0.0–7.0)
Eosinophils Absolute: 0.2 10*3/uL (ref 0.0–0.5)
HEMATOCRIT: 21.5 % — AB (ref 34.8–46.6)
LYMPH#: 0.8 10*3/uL — AB (ref 0.9–3.3)
LYMPH%: 16.1 % (ref 14.0–48.0)
MCH: 17.9 pg — ABNORMAL LOW (ref 26.0–34.0)
MCHC: 25.1 g/dL — AB (ref 32.0–36.0)
MCV: 71 fL — ABNORMAL LOW (ref 81–101)
MONO#: 0.7 10*3/uL (ref 0.1–0.9)
MONO%: 13.8 % — AB (ref 0.0–13.0)
NEUT%: 65.1 % (ref 39.6–80.0)
NEUTROS ABS: 3.1 10*3/uL (ref 1.5–6.5)
Platelets: 135 10*3/uL — ABNORMAL LOW (ref 145–400)
RBC: 3.01 10*6/uL — AB (ref 3.70–5.32)
RDW: 21.2 % — ABNORMAL HIGH (ref 11.1–15.7)
WBC: 4.8 10*3/uL (ref 3.9–10.0)

## 2016-02-11 NOTE — Telephone Encounter (Signed)
Critical Value HGB 5.4 Dr Ennever notified. No orders at this time 

## 2016-02-23 ENCOUNTER — Other Ambulatory Visit: Payer: Self-pay | Admitting: *Deleted

## 2016-02-23 DIAGNOSIS — M797 Fibromyalgia: Secondary | ICD-10-CM

## 2016-02-23 DIAGNOSIS — D45 Polycythemia vera: Secondary | ICD-10-CM

## 2016-02-23 DIAGNOSIS — I358 Other nonrheumatic aortic valve disorders: Secondary | ICD-10-CM

## 2016-02-23 MED ORDER — OXYCODONE HCL 15 MG PO TABS: 15.0000 mg | ORAL_TABLET | Freq: Four times a day (QID) | ORAL | 0 refills | Status: DC | PRN

## 2016-02-23 MED ORDER — OXYCODONE HCL ER 20 MG PO T12A: 20.0000 mg | EXTENDED_RELEASE_TABLET | Freq: Three times a day (TID) | ORAL | 0 refills | Status: DC

## 2016-02-23 MED ORDER — OXYCODONE HCL ER 10 MG PO T12A
10.0000 mg | EXTENDED_RELEASE_TABLET | Freq: Two times a day (BID) | ORAL | 0 refills | Status: DC
Start: 2016-02-23 — End: 2016-04-05

## 2016-02-25 ENCOUNTER — Telehealth: Payer: Self-pay | Admitting: *Deleted

## 2016-02-25 ENCOUNTER — Other Ambulatory Visit (HOSPITAL_BASED_OUTPATIENT_CLINIC_OR_DEPARTMENT_OTHER): Payer: Medicare Other

## 2016-02-25 DIAGNOSIS — D45 Polycythemia vera: Secondary | ICD-10-CM | POA: Diagnosis present

## 2016-02-25 LAB — CBC WITH DIFFERENTIAL (CANCER CENTER ONLY)
BASO#: 0 10*3/uL (ref 0.0–0.2)
BASO%: 0.2 % (ref 0.0–2.0)
EOS ABS: 0.2 10*3/uL (ref 0.0–0.5)
EOS%: 4.2 % (ref 0.0–7.0)
HEMATOCRIT: 21.5 % — AB (ref 34.8–46.6)
HEMOGLOBIN: 5.3 g/dL — AB (ref 11.6–15.9)
LYMPH#: 0.7 10*3/uL — ABNORMAL LOW (ref 0.9–3.3)
LYMPH%: 15.6 % (ref 14.0–48.0)
MCH: 18 pg — AB (ref 26.0–34.0)
MCHC: 24.7 g/dL — AB (ref 32.0–36.0)
MCV: 73 fL — AB (ref 81–101)
MONO#: 0.7 10*3/uL (ref 0.1–0.9)
MONO%: 15.2 % — AB (ref 0.0–13.0)
NEUT%: 64.8 % (ref 39.6–80.0)
NEUTROS ABS: 2.9 10*3/uL (ref 1.5–6.5)
Platelets: 143 10*3/uL — ABNORMAL LOW (ref 145–400)
RBC: 2.95 10*6/uL — AB (ref 3.70–5.32)
RDW: 20.6 % — ABNORMAL HIGH (ref 11.1–15.7)
WBC: 4.5 10*3/uL (ref 3.9–10.0)

## 2016-02-25 NOTE — Telephone Encounter (Signed)
Critical Value Hgb 5.3 Dr Marin Olp notified. No orders at this time

## 2016-03-10 ENCOUNTER — Telehealth: Payer: Self-pay | Admitting: *Deleted

## 2016-03-10 ENCOUNTER — Other Ambulatory Visit (HOSPITAL_BASED_OUTPATIENT_CLINIC_OR_DEPARTMENT_OTHER): Payer: Medicare Other

## 2016-03-10 DIAGNOSIS — D45 Polycythemia vera: Secondary | ICD-10-CM

## 2016-03-10 LAB — CBC WITH DIFFERENTIAL (CANCER CENTER ONLY)
BASO#: 0 10*3/uL (ref 0.0–0.2)
BASO%: 0 % (ref 0.0–2.0)
EOS ABS: 0.3 10*3/uL (ref 0.0–0.5)
EOS%: 7.1 % — ABNORMAL HIGH (ref 0.0–7.0)
HCT: 23 % — ABNORMAL LOW (ref 34.8–46.6)
HEMOGLOBIN: 5.8 g/dL — AB (ref 11.6–15.9)
LYMPH#: 0.8 10*3/uL — ABNORMAL LOW (ref 0.9–3.3)
LYMPH%: 19.5 % (ref 14.0–48.0)
MCH: 18.1 pg — AB (ref 26.0–34.0)
MCHC: 25.2 g/dL — AB (ref 32.0–36.0)
MCV: 72 fL — ABNORMAL LOW (ref 81–101)
MONO#: 0.6 10*3/uL (ref 0.1–0.9)
MONO%: 13.6 % — AB (ref 0.0–13.0)
NEUT%: 59.8 % (ref 39.6–80.0)
NEUTROS ABS: 2.5 10*3/uL (ref 1.5–6.5)
Platelets: 164 10*3/uL (ref 145–400)
RBC: 3.21 10*6/uL — ABNORMAL LOW (ref 3.70–5.32)
RDW: 20.2 % — ABNORMAL HIGH (ref 11.1–15.7)
WBC: 4.2 10*3/uL (ref 3.9–10.0)

## 2016-03-10 NOTE — Telephone Encounter (Signed)
Critical Value HGB 5.8 Dr Ennever notified. No orders at this time.  

## 2016-03-17 ENCOUNTER — Ambulatory Visit (HOSPITAL_BASED_OUTPATIENT_CLINIC_OR_DEPARTMENT_OTHER): Payer: Medicare Other

## 2016-03-17 VITALS — BP 110/36 | HR 60 | Temp 98.0°F | Resp 20

## 2016-03-17 DIAGNOSIS — D45 Polycythemia vera: Secondary | ICD-10-CM

## 2016-03-17 NOTE — Patient Instructions (Signed)
     Therapeutic Phlebotomy, Care After Refer to this sheet in the next few weeks. These instructions provide you with information about caring for yourself after your procedure. Your health care provider may also give you more specific instructions. Your treatment has been planned according to current medical practices, but problems sometimes occur. Call your health care provider if you have any problems or questions after your procedure. What can I expect after the procedure? After the procedure, it is common to have:  Light-headedness or dizziness. You may feel faint.  Nausea.  Tiredness. Follow these instructions at home: Activity  Return to your normal activities as directed by your health care provider. Most people can go back to their normal activities right away.  Avoid strenuous physical activity and heavy lifting or pulling for about 5 hours after the procedure. Do not lift anything that is heavier than 10 lb (4.5 kg).  Athletes should avoid strenuous exercise for at least 12 hours.  Change positions slowly for the remainder of the day. This will help to prevent light-headedness or fainting.  If you feel light-headed, lie down until the feeling goes away. Eating and drinking  Be sure to eat well-balanced meals for the next 24 hours.  Drink enough fluid to keep your urine clear or pale yellow.  Avoid drinking alcohol on the day that you had the procedure. Care of the Needle Insertion Site  Keep your bandage dry. You can remove the bandage after about 5 hours or as directed by your health care provider.  If you have bleeding from the needle insertion site, elevate your arm and press firmly on the site until the bleeding stops.  If you have bruising at the site, apply ice to the area:  Put ice in a plastic bag.  Place a towel between your skin and the bag.  Leave the ice on for 20 minutes, 2-3 times a day for the first 24 hours.  If the swelling does not go away  after 24 hours, apply a warm, moist washcloth to the area for 20 minutes, 2-3 times a day. General instructions  Avoid smoking for at least 30 minutes after the procedure.  Keep all follow-up visits as directed by your health care provider. It is important to continue with further therapeutic phlebotomy treatments as directed. Contact a health care provider if:  You have redness, swelling, or pain at the needle insertion site.  You have fluid, blood, or pus coming from the needle insertion site.  You feel light-headed, dizzy, or nauseated, and the feeling does not go away.  You notice new bruising at the needle insertion site.  You feel weaker than normal.  You have a fever or chills. Get help right away if:  You have severe nausea or vomiting.  You have chest pain.  You have trouble breathing. This information is not intended to replace advice given to you by your health care provider. Make sure you discuss any questions you have with your health care provider. Document Released: 08/17/2010 Document Revised: 11/15/2015 Document Reviewed: 03/11/2014 Elsevier Interactive Patient Education  2017 Elsevier Inc.  

## 2016-03-17 NOTE — Progress Notes (Signed)
[name] presents today for phlebotomy per MD orders. Phlebotomy procedure started at 1224 and ended at 1231. 560 grams removed. Patient observed for 30 minutes after procedure without any incident. Patient tolerated procedure well. IV needle removed intact.

## 2016-03-24 ENCOUNTER — Other Ambulatory Visit (HOSPITAL_BASED_OUTPATIENT_CLINIC_OR_DEPARTMENT_OTHER): Payer: Medicare Other

## 2016-03-24 ENCOUNTER — Ambulatory Visit (HOSPITAL_BASED_OUTPATIENT_CLINIC_OR_DEPARTMENT_OTHER): Payer: Medicare Other | Admitting: Hematology & Oncology

## 2016-03-24 ENCOUNTER — Telehealth: Payer: Self-pay | Admitting: *Deleted

## 2016-03-24 VITALS — BP 121/40 | HR 82 | Temp 97.6°F | Resp 20 | Wt 164.1 lb

## 2016-03-24 DIAGNOSIS — D45 Polycythemia vera: Secondary | ICD-10-CM

## 2016-03-24 DIAGNOSIS — Z8673 Personal history of transient ischemic attack (TIA), and cerebral infarction without residual deficits: Secondary | ICD-10-CM | POA: Diagnosis not present

## 2016-03-24 LAB — COMPREHENSIVE METABOLIC PANEL
ALBUMIN: 3.7 g/dL (ref 3.5–5.0)
ALK PHOS: 114 U/L (ref 40–150)
ALT: 7 U/L (ref 0–55)
ANION GAP: 9 meq/L (ref 3–11)
AST: 15 U/L (ref 5–34)
BUN: 8.2 mg/dL (ref 7.0–26.0)
CALCIUM: 9.2 mg/dL (ref 8.4–10.4)
CHLORIDE: 99 meq/L (ref 98–109)
CO2: 31 mEq/L — ABNORMAL HIGH (ref 22–29)
Creatinine: 0.8 mg/dL (ref 0.6–1.1)
EGFR: 70 mL/min/{1.73_m2} — AB (ref >90)
Glucose: 103 mg/dl (ref 70–140)
POTASSIUM: 3.3 meq/L — AB (ref 3.5–5.1)
Sodium: 138 mEq/L (ref 136–145)
Total Bilirubin: 0.83 mg/dL (ref 0.20–1.20)
Total Protein: 6.9 g/dL (ref 6.4–8.3)

## 2016-03-24 LAB — CBC WITH DIFFERENTIAL (CANCER CENTER ONLY)
BASO#: 0 10*3/uL (ref 0.0–0.2)
BASO%: 0.2 % (ref 0.0–2.0)
EOS ABS: 0.4 10*3/uL (ref 0.0–0.5)
EOS%: 8.4 % — ABNORMAL HIGH (ref 0.0–7.0)
HEMATOCRIT: 20.5 % — AB (ref 34.8–46.6)
HEMOGLOBIN: 5.1 g/dL — AB (ref 11.6–15.9)
LYMPH#: 0.8 10*3/uL — AB (ref 0.9–3.3)
LYMPH%: 18.6 % (ref 14.0–48.0)
MCH: 17.6 pg — ABNORMAL LOW (ref 26.0–34.0)
MCHC: 24.9 g/dL — AB (ref 32.0–36.0)
MCV: 71 fL — AB (ref 81–101)
MONO#: 0.5 10*3/uL (ref 0.1–0.9)
MONO%: 11.8 % (ref 0.0–13.0)
NEUT%: 61 % (ref 39.6–80.0)
NEUTROS ABS: 2.7 10*3/uL (ref 1.5–6.5)
Platelets: 184 10*3/uL (ref 145–400)
RBC: 2.9 10*6/uL — ABNORMAL LOW (ref 3.70–5.32)
RDW: 20.1 % — AB (ref 11.1–15.7)
WBC: 4.4 10*3/uL (ref 3.9–10.0)

## 2016-03-24 NOTE — Progress Notes (Signed)
Hematology and Oncology Follow Up Visit  [name] [id number] Aug 24, 1945 70 y.o. 03/24/2016   Principle Diagnosis:  Polycythemia vera -- hyperviscosity variant. 2. 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  Current Therapy:    Phlebotomy to maintain hemoglobin below 6.0     Interim History:  [name] is back for followup. She was phlebotomized 2 weeks ago. She really felt tired. She felt very sluggish. This is very typical of her when her blood count gets "too high" for her. After she gets phlebotomized, she feels a whole lot better.  She's had a little bit of a headache. She's had no visual problems.  There's no cough. She's had no shortness of breath. She does get fatigued more easily. She is able to cook a very nice dinner last night. This really made her feel better.  She's had no change in bowel or bladder habits.  She's had no leg swelling. She does have fibromyalgia but this is not "flared up".  There's not been any issues with rashes. She's had no fever. She's had no problem with infections. She is prone to UTIs.  She is incredibly iron deficient by nature of her phlebotomies. We have to keep her iron levels down because she will definitely increase her hemoglobin if her iron level started going up. This would be quite risky for her.   Overall, her performance status is ECOG 1.   Medications:Allergies:  Allergies  Allergen Reactions  . Ethrane [Enflurane] Nausea And Vomiting  . Codeine Nausea Only  . Synthroid [Levothyroxine Sodium] Other (See Comments)    Not effective. Causes excessive sleepiness. Takes armour thyroid  . Calcium Channel Blockers Swelling, Palpitations and Hypertension  . Penicillins Rash    Past Medical History, Surgical history, Social history, and Family History were reviewed and updated.  Review of Systems: As above  Physical Exam:  weight is 164 lb 1.9 oz (74.4 kg). Her oral temperature is 97.6  F (36.4 C). Her blood pressure is 121/40 (abnormal) and her pulse is 82. Her respiration is 20.   Well-developed and well-nourished white female in no obvious distress. Head and neck exam shows pale conjunctiva. There is no oral lesions. There is no adenopathy in the neck. Lungs are clear bilaterally. Cardiac exam regular rate and rhythm with a 1/6 systolic ejection murmur. Abdomen is soft. Has good bowel sounds. There is no palpable liver or spleen tip appreciative well-healed laparotomy scars. Back exam no tenderness over the spine ribs or hips. Extremities shows no clubbing cyanosis or edema. Neurological exam shows no focal neurological deficits. Skin exam no rashes, ecchymoses or petechia.  Lab Results  Component Value Date   WBC 4.4 03/24/2016   HGB 5.1 (LL) 03/24/2016   HCT 20.5 (L) 03/24/2016   MCV 71 (L) 03/24/2016   PLT 184 Platelet count confirmed by slide estimate 03/24/2016     Chemistry      Component Value Date/Time   NA 143 01/21/2016 1114   K 3.8 01/21/2016 1114   CL 94 (L) 12/31/2015 1315   CO2 30 (H) 01/21/2016 1114   BUN 9.7 01/21/2016 1114   CREATININE 0.7 01/21/2016 1114      Component Value Date/Time   CALCIUM 9.1 01/21/2016 1114   ALKPHOS 116 01/21/2016 1114   AST 12 01/21/2016 1114   ALT 6 01/21/2016 1114   BILITOT 0.61 01/21/2016 1114         Impression and Plan: [name] is  70 year old female with polycythemia vera. She is very hyperviscous.   We will continue to follow her along and check her blood counts every 2 weeks.   I will see her back in another 6 weeks    . Volanda Napoleon, MD 12/27/20173:14 PM

## 2016-03-24 NOTE — Telephone Encounter (Signed)
Critical Value HGB 5.1 Dr Marin Olp notified. No orders at this time.

## 2016-03-25 LAB — IRON AND TIBC
%SAT: 3 % — ABNORMAL LOW (ref 21–57)
Iron: 15 ug/dL — ABNORMAL LOW (ref 41–142)
TIBC: 509 ug/dL — AB (ref 236–444)
UIBC: 493 ug/dL — AB (ref 120–384)

## 2016-03-25 LAB — FERRITIN: Ferritin: <4 ng/ml — ABNORMAL LOW (ref 9–269)

## 2016-03-30 ENCOUNTER — Other Ambulatory Visit: Payer: Self-pay | Admitting: Family Medicine

## 2016-03-30 DIAGNOSIS — F411 Generalized anxiety disorder: Secondary | ICD-10-CM

## 2016-03-30 NOTE — Telephone Encounter (Signed)
Received Rx request for Xanax 0.5mg  tablets (#60; 1RF; Take 1 tablet po q6h PRN for anxiety).  Last Rf: 12/09/2015 Last Ov: 12/09/2015 Next Ov: 06/08/2016 UDS: None  Forwarded to Provider for review, approval or denial.

## 2016-03-30 NOTE — Telephone Encounter (Signed)
Faxed hard copy of Refill Request to [facility] [phone]; [phone] [place]).

## 2016-04-05 ENCOUNTER — Other Ambulatory Visit: Payer: Self-pay | Admitting: *Deleted

## 2016-04-05 DIAGNOSIS — D45 Polycythemia vera: Secondary | ICD-10-CM

## 2016-04-05 DIAGNOSIS — I358 Other nonrheumatic aortic valve disorders: Secondary | ICD-10-CM

## 2016-04-05 DIAGNOSIS — M797 Fibromyalgia: Secondary | ICD-10-CM

## 2016-04-05 MED ORDER — OXYCODONE HCL 15 MG PO TABS: 15.0000 mg | ORAL_TABLET | Freq: Four times a day (QID) | ORAL | 0 refills | Status: DC | PRN

## 2016-04-05 MED ORDER — OXYCODONE HCL ER 20 MG PO T12A: 20.0000 mg | EXTENDED_RELEASE_TABLET | Freq: Three times a day (TID) | ORAL | 0 refills | Status: DC

## 2016-04-05 MED ORDER — OXYCODONE HCL ER 10 MG PO T12A: 10.0000 mg | EXTENDED_RELEASE_TABLET | Freq: Two times a day (BID) | ORAL | 0 refills | Status: DC

## 2016-04-07 ENCOUNTER — Telehealth: Payer: Self-pay | Admitting: *Deleted

## 2016-04-07 ENCOUNTER — Other Ambulatory Visit (HOSPITAL_BASED_OUTPATIENT_CLINIC_OR_DEPARTMENT_OTHER): Payer: Medicare Other

## 2016-04-07 ENCOUNTER — Ambulatory Visit: Payer: Medicare Other | Admitting: Hematology & Oncology

## 2016-04-07 DIAGNOSIS — D45 Polycythemia vera: Secondary | ICD-10-CM

## 2016-04-07 LAB — CBC WITH DIFFERENTIAL (CANCER CENTER ONLY)
BASO#: 0 10*3/uL (ref 0.0–0.2)
BASO%: 0.3 % (ref 0.0–2.0)
EOS ABS: 0.3 10*3/uL (ref 0.0–0.5)
EOS%: 7.3 % — ABNORMAL HIGH (ref 0.0–7.0)
HEMATOCRIT: 21.1 % — AB (ref 34.8–46.6)
HEMOGLOBIN: 5.2 g/dL — AB (ref 11.6–15.9)
LYMPH#: 0.9 10*3/uL (ref 0.9–3.3)
LYMPH%: 23.6 % (ref 14.0–48.0)
MCH: 17.6 pg — AB (ref 26.0–34.0)
MCHC: 24.6 g/dL — ABNORMAL LOW (ref 32.0–36.0)
MCV: 71 fL — AB (ref 81–101)
MONO#: 0.5 10*3/uL (ref 0.1–0.9)
MONO%: 14.1 % — ABNORMAL HIGH (ref 0.0–13.0)
NEUT#: 2 10*3/uL (ref 1.5–6.5)
NEUT%: 54.7 % (ref 39.6–80.0)
Platelets: 111 10*3/uL — ABNORMAL LOW (ref 145–400)
RBC: 2.96 10*6/uL — ABNORMAL LOW (ref 3.70–5.32)
RDW: 20.1 % — ABNORMAL HIGH (ref 11.1–15.7)
WBC: 3.7 10*3/uL — AB (ref 3.9–10.0)

## 2016-04-07 NOTE — Telephone Encounter (Signed)
Critical Value HGB 5.2 Dr Ennever notified. No orders at this time 

## 2016-04-21 ENCOUNTER — Other Ambulatory Visit (HOSPITAL_BASED_OUTPATIENT_CLINIC_OR_DEPARTMENT_OTHER): Payer: Medicare Other

## 2016-04-21 ENCOUNTER — Telehealth: Payer: Self-pay | Admitting: *Deleted

## 2016-04-21 DIAGNOSIS — D45 Polycythemia vera: Secondary | ICD-10-CM

## 2016-04-21 LAB — CBC WITH DIFFERENTIAL (CANCER CENTER ONLY)
BASO#: 0 10*3/uL (ref 0.0–0.2)
BASO%: 0.2 % (ref 0.0–2.0)
EOS ABS: 0.3 10*3/uL (ref 0.0–0.5)
EOS%: 6.1 % (ref 0.0–7.0)
HCT: 23 % — ABNORMAL LOW (ref 34.8–46.6)
HEMOGLOBIN: 5.8 g/dL — AB (ref 11.6–15.9)
LYMPH#: 0.8 10*3/uL — ABNORMAL LOW (ref 0.9–3.3)
LYMPH%: 19.1 % (ref 14.0–48.0)
MCH: 17.6 pg — AB (ref 26.0–34.0)
MCHC: 25.2 g/dL — ABNORMAL LOW (ref 32.0–36.0)
MCV: 70 fL — ABNORMAL LOW (ref 81–101)
MONO#: 0.7 10*3/uL (ref 0.1–0.9)
MONO%: 16.7 % — AB (ref 0.0–13.0)
NEUT%: 57.9 % (ref 39.6–80.0)
NEUTROS ABS: 2.4 10*3/uL (ref 1.5–6.5)
PLATELETS: 145 10*3/uL (ref 145–400)
RBC: 3.29 10*6/uL — AB (ref 3.70–5.32)
RDW: 20.2 % — ABNORMAL HIGH (ref 11.1–15.7)
WBC: 4.1 10*3/uL (ref 3.9–10.0)

## 2016-04-21 LAB — TECHNOLOGIST REVIEW CHCC SATELLITE

## 2016-04-21 NOTE — Telephone Encounter (Signed)
Critical Value HGB 5.8 Dr Ennever notified. No orders at this time.  

## 2016-04-29 ENCOUNTER — Other Ambulatory Visit (HOSPITAL_BASED_OUTPATIENT_CLINIC_OR_DEPARTMENT_OTHER): Payer: Medicare Other

## 2016-04-29 DIAGNOSIS — D5700 Hb-SS disease with crisis, unspecified: Secondary | ICD-10-CM | POA: Diagnosis present

## 2016-04-29 DIAGNOSIS — D45 Polycythemia vera: Secondary | ICD-10-CM

## 2016-04-29 LAB — CBC WITH DIFFERENTIAL (CANCER CENTER ONLY)
BASO#: 0 10*3/uL (ref 0.0–0.2)
BASO%: 0 % (ref 0.0–2.0)
EOS%: 8.8 % — ABNORMAL HIGH (ref 0.0–7.0)
Eosinophils Absolute: 0.4 10*3/uL (ref 0.0–0.5)
HEMATOCRIT: 22.3 % — AB (ref 34.8–46.6)
HGB: 5.5 g/dL — CL (ref 11.6–15.9)
LYMPH#: 0.7 10*3/uL — AB (ref 0.9–3.3)
LYMPH%: 16.5 % (ref 14.0–48.0)
MCH: 17.5 pg — ABNORMAL LOW (ref 26.0–34.0)
MCHC: 24.7 g/dL — ABNORMAL LOW (ref 32.0–36.0)
MCV: 71 fL — AB (ref 81–101)
MONO#: 0.6 10*3/uL (ref 0.1–0.9)
MONO%: 14.9 % — ABNORMAL HIGH (ref 0.0–13.0)
NEUT#: 2.6 10*3/uL (ref 1.5–6.5)
NEUT%: 59.8 % (ref 39.6–80.0)
Platelets: 148 10*3/uL (ref 145–400)
RBC: 3.14 10*6/uL — ABNORMAL LOW (ref 3.70–5.32)
RDW: 20.4 % — AB (ref 11.1–15.7)
WBC: 4.3 10*3/uL (ref 3.9–10.0)

## 2016-05-03 ENCOUNTER — Other Ambulatory Visit: Payer: Self-pay | Admitting: *Deleted

## 2016-05-03 DIAGNOSIS — D45 Polycythemia vera: Secondary | ICD-10-CM

## 2016-05-03 DIAGNOSIS — M797 Fibromyalgia: Secondary | ICD-10-CM

## 2016-05-03 DIAGNOSIS — I358 Other nonrheumatic aortic valve disorders: Secondary | ICD-10-CM

## 2016-05-03 MED ORDER — OXYCODONE HCL ER 10 MG PO T12A: 10.0000 mg | EXTENDED_RELEASE_TABLET | Freq: Two times a day (BID) | ORAL | 0 refills | Status: DC

## 2016-05-03 MED ORDER — OXYCODONE HCL 15 MG PO TABS: 15.0000 mg | ORAL_TABLET | Freq: Four times a day (QID) | ORAL | 0 refills | Status: DC | PRN

## 2016-05-03 MED ORDER — OXYCODONE HCL ER 20 MG PO T12A: 20.0000 mg | EXTENDED_RELEASE_TABLET | Freq: Three times a day (TID) | ORAL | 0 refills | Status: DC

## 2016-05-04 ENCOUNTER — Other Ambulatory Visit (HOSPITAL_BASED_OUTPATIENT_CLINIC_OR_DEPARTMENT_OTHER): Payer: Medicare Other

## 2016-05-04 DIAGNOSIS — D45 Polycythemia vera: Secondary | ICD-10-CM | POA: Diagnosis present

## 2016-05-04 LAB — CBC WITH DIFFERENTIAL (CANCER CENTER ONLY)
BASO#: 0 10*3/uL (ref 0.0–0.2)
BASO%: 0.3 % (ref 0.0–2.0)
EOS ABS: 0.5 10*3/uL (ref 0.0–0.5)
EOS%: 13 % — ABNORMAL HIGH (ref 0.0–7.0)
HCT: 22.8 % — ABNORMAL LOW (ref 34.8–46.6)
HGB: 5.6 g/dL — CL (ref 11.6–15.9)
LYMPH#: 0.7 10*3/uL — ABNORMAL LOW (ref 0.9–3.3)
LYMPH%: 16.8 % (ref 14.0–48.0)
MCH: 17.6 pg — AB (ref 26.0–34.0)
MCHC: 24.6 g/dL — AB (ref 32.0–36.0)
MCV: 72 fL — AB (ref 81–101)
MONO#: 0.4 10*3/uL (ref 0.1–0.9)
MONO%: 11.2 % (ref 0.0–13.0)
NEUT#: 2.3 10*3/uL (ref 1.5–6.5)
NEUT%: 58.7 % (ref 39.6–80.0)
PLATELETS: 122 10*3/uL — AB (ref 145–400)
RBC: 3.18 10*6/uL — ABNORMAL LOW (ref 3.70–5.32)
RDW: 20.8 % — ABNORMAL HIGH (ref 11.1–15.7)
WBC: 3.9 10*3/uL (ref 3.9–10.0)

## 2016-05-18 ENCOUNTER — Telehealth: Payer: Self-pay | Admitting: *Deleted

## 2016-05-18 ENCOUNTER — Other Ambulatory Visit (HOSPITAL_BASED_OUTPATIENT_CLINIC_OR_DEPARTMENT_OTHER): Payer: Medicare Other

## 2016-05-18 DIAGNOSIS — D45 Polycythemia vera: Secondary | ICD-10-CM | POA: Diagnosis present

## 2016-05-18 LAB — CBC WITH DIFFERENTIAL (CANCER CENTER ONLY)
BASO#: 0 10*3/uL (ref 0.0–0.2)
BASO%: 0.2 % (ref 0.0–2.0)
EOS ABS: 0.4 10*3/uL (ref 0.0–0.5)
EOS%: 8.9 % — ABNORMAL HIGH (ref 0.0–7.0)
HEMATOCRIT: 23 % — AB (ref 34.8–46.6)
HEMOGLOBIN: 5.7 g/dL — AB (ref 11.6–15.9)
LYMPH#: 0.8 10*3/uL — AB (ref 0.9–3.3)
LYMPH%: 16.8 % (ref 14.0–48.0)
MCH: 17.5 pg — AB (ref 26.0–34.0)
MCHC: 24.8 g/dL — ABNORMAL LOW (ref 32.0–36.0)
MCV: 71 fL — ABNORMAL LOW (ref 81–101)
MONO#: 0.6 10*3/uL (ref 0.1–0.9)
MONO%: 13.4 % — ABNORMAL HIGH (ref 0.0–13.0)
NEUT%: 60.7 % (ref 39.6–80.0)
NEUTROS ABS: 2.8 10*3/uL (ref 1.5–6.5)
Platelets: 132 10*3/uL — ABNORMAL LOW (ref 145–400)
RBC: 3.25 10*6/uL — AB (ref 3.70–5.32)
RDW: 20.3 % — ABNORMAL HIGH (ref 11.1–15.7)
WBC: 4.6 10*3/uL (ref 3.9–10.0)

## 2016-05-18 NOTE — Telephone Encounter (Signed)
Critical Value HGB 5.7 Dr Marin Olp notified. No orders at this time

## 2016-05-19 ENCOUNTER — Other Ambulatory Visit: Payer: Medicare Other

## 2016-05-19 ENCOUNTER — Ambulatory Visit: Payer: Medicare Other | Admitting: Hematology & Oncology

## 2016-05-24 ENCOUNTER — Ambulatory Visit (HOSPITAL_BASED_OUTPATIENT_CLINIC_OR_DEPARTMENT_OTHER): Payer: Medicare Other | Admitting: Hematology & Oncology

## 2016-05-24 ENCOUNTER — Other Ambulatory Visit (HOSPITAL_BASED_OUTPATIENT_CLINIC_OR_DEPARTMENT_OTHER): Payer: Medicare Other

## 2016-05-24 ENCOUNTER — Telehealth: Payer: Self-pay | Admitting: *Deleted

## 2016-05-24 ENCOUNTER — Ambulatory Visit (HOSPITAL_BASED_OUTPATIENT_CLINIC_OR_DEPARTMENT_OTHER): Payer: Medicare Other

## 2016-05-24 VITALS — BP 134/47 | HR 83 | Temp 98.0°F | Resp 18 | Wt 163.0 lb

## 2016-05-24 VITALS — BP 115/44 | HR 68 | Resp 20

## 2016-05-24 DIAGNOSIS — D45 Polycythemia vera: Secondary | ICD-10-CM

## 2016-05-24 DIAGNOSIS — I6302 Cerebral infarction due to thrombosis of basilar artery: Secondary | ICD-10-CM | POA: Diagnosis not present

## 2016-05-24 LAB — CBC WITH DIFFERENTIAL (CANCER CENTER ONLY)
BASO#: 0 10*3/uL (ref 0.0–0.2)
BASO%: 0 % (ref 0.0–2.0)
EOS ABS: 0.3 10*3/uL (ref 0.0–0.5)
EOS%: 7.4 % — AB (ref 0.0–7.0)
HCT: 24.4 % — ABNORMAL LOW (ref 34.8–46.6)
HGB: 6.1 g/dL — CL (ref 11.6–15.9)
LYMPH#: 0.8 10*3/uL — ABNORMAL LOW (ref 0.9–3.3)
LYMPH%: 18.4 % (ref 14.0–48.0)
MCH: 17.1 pg — AB (ref 26.0–34.0)
MCHC: 25 g/dL — ABNORMAL LOW (ref 32.0–36.0)
MCV: 69 fL — ABNORMAL LOW (ref 81–101)
MONO#: 0.5 10*3/uL (ref 0.1–0.9)
MONO%: 11.5 % (ref 0.0–13.0)
NEUT#: 2.7 10*3/uL (ref 1.5–6.5)
NEUT%: 62.7 % (ref 39.6–80.0)
PLATELETS: 126 10*3/uL — AB (ref 145–400)
RBC: 3.56 10*6/uL — AB (ref 3.70–5.32)
RDW: 19.9 % — ABNORMAL HIGH (ref 11.1–15.7)
WBC: 4.3 10*3/uL (ref 3.9–10.0)

## 2016-05-24 LAB — COMPREHENSIVE METABOLIC PANEL (CC13)
ALT: 6 IU/L (ref 0–32)
AST: 12 IU/L (ref 0–40)
Albumin, Serum: 4.2 g/dL (ref 3.5–4.8)
Albumin/Globulin Ratio: 1.4 (ref 1.2–2.2)
Alkaline Phosphatase, S: 104 IU/L (ref 39–117)
BILIRUBIN TOTAL: 0.6 mg/dL (ref 0.0–1.2)
BUN/Creatinine Ratio: 17 (ref 12–28)
BUN: 14 mg/dL (ref 8–27)
CALCIUM: 9.7 mg/dL (ref 8.7–10.3)
CHLORIDE: 94 mmol/L — AB (ref 96–106)
Carbon Dioxide, Total: 34 mmol/L — ABNORMAL HIGH (ref 18–29)
Creatinine, Ser: 0.84 mg/dL (ref 0.57–1.00)
GFR calc Af Amer: 80 mL/min/{1.73_m2} (ref >59)
GFR, EST NON AFRICAN AMERICAN: 70 mL/min/{1.73_m2} (ref >59)
Globulin, Total: 3 g/dL (ref 1.5–4.5)
Glucose: 139 mg/dL — ABNORMAL HIGH (ref 65–99)
POTASSIUM: 3.2 mmol/L — AB (ref 3.5–5.2)
Sodium: 135 mmol/L (ref 134–144)
Total Protein: 7.2 g/dL (ref 6.0–8.5)

## 2016-05-24 LAB — TECHNOLOGIST REVIEW CHCC SATELLITE

## 2016-05-24 LAB — LACTATE DEHYDROGENASE: LDH: 166 U/L (ref 125–245)

## 2016-05-24 NOTE — Progress Notes (Signed)
Hematology and Oncology Follow Up Visit  [name] [id number] 28-Dec-1945 71 y.o. 05/24/2016   Principle Diagnosis:  Polycythemia vera -- hyperviscosity variant. 2. 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  Current Therapy:    Phlebotomy to maintain hemoglobin below 6.0     Interim History:  [name] is back for followup. She is not feeling all that well. Is probably being close to 10 weeks since she had a phlebotomy. I told her that the reason for this is at her iron levels are still low and that when her iron level start creeping up, her blood count comes up a little bit.  She's had no headache. She's had no fever. She's had no cough. She's had no change in bowel or bladder habits.  She does have some leg swelling. This is chronic.   Overall, her performance status is ECOG 1.   Medications:Allergies:  Allergies  Allergen Reactions  . Ethrane [Enflurane] Nausea And Vomiting  . Codeine Nausea Only  . Synthroid [Levothyroxine Sodium] Other (See Comments)    Not effective. Causes excessive sleepiness. Takes armour thyroid  . Calcium Channel Blockers Swelling, Palpitations and Hypertension  . Penicillins Rash    Past Medical History, Surgical history, Social history, and Family History were reviewed and updated.  Review of Systems: As above  Physical Exam:  weight is 163 lb (73.9 kg). Her oral temperature is 98 F (36.7 C). Her blood pressure is 134/47 (abnormal) and her pulse is 83. Her respiration is 18 and oxygen saturation is 99%.   Well-developed and well-nourished white female in no obvious distress. Head and neck exam shows pale conjunctiva. There is no oral lesions. There is no adenopathy in the neck. Lungs are clear bilaterally. Cardiac exam regular rate and rhythm with a 1/6 systolic ejection murmur. Abdomen is soft. Has good bowel sounds. There is no palpable liver or spleen tip appreciative well-healed laparotomy scars.  Back exam no tenderness over the spine ribs or hips. Extremities shows no clubbing cyanosis or edema. Neurological exam shows no focal neurological deficits. Skin exam no rashes, ecchymoses or petechia.  Lab Results  Component Value Date   WBC 4.3 05/24/2016   HGB 6.1 (LL) 05/24/2016   HCT 24.4 (L) 05/24/2016   MCV 69 (L) 05/24/2016   PLT 126 (L) 05/24/2016     Chemistry      Component Value Date/Time   NA 138 03/24/2016 1306   K 3.3 (L) 03/24/2016 1306   CL 94 (L) 12/31/2015 1315   CO2 31 (H) 03/24/2016 1306   BUN 8.2 03/24/2016 1306   CREATININE 0.8 03/24/2016 1306      Component Value Date/Time   CALCIUM 9.2 03/24/2016 1306   ALKPHOS 114 03/24/2016 1306   AST 15 03/24/2016 1306   ALT 7 03/24/2016 1306   BILITOT 0.83 03/24/2016 1306         Impression and Plan: [name] is 71 year old female with polycythemia vera. She is very hyperviscous.We will definitely phlebotomize her. We can actually phlebotomize her today.  We are checking her blood counts every 2 weeks. I will then plan to get her back to see me in about 8 weeks.   Volanda Napoleon, MD 2/26/20183:14 PM

## 2016-05-24 NOTE — Progress Notes (Signed)
[name] presents today for phlebotomy per MD orders. Phlebotomy procedure started at 1528 and ended at 1535. 525 grams removed. Patient observed for 30 minutes after procedure without any incident. Patient tolerated procedure well. IV needle removed intact.

## 2016-05-24 NOTE — Telephone Encounter (Signed)
Critical Value HGB 6.1 Dr Marin Olp notified. No orders at this time

## 2016-05-24 NOTE — Patient Instructions (Signed)
     Therapeutic Phlebotomy, Care After Refer to this sheet in the next few weeks. These instructions provide you with information about caring for yourself after your procedure. Your health care provider may also give you more specific instructions. Your treatment has been planned according to current medical practices, but problems sometimes occur. Call your health care provider if you have any problems or questions after your procedure. What can I expect after the procedure? After the procedure, it is common to have:  Light-headedness or dizziness. You may feel faint.  Nausea.  Tiredness. Follow these instructions at home: Activity  Return to your normal activities as directed by your health care provider. Most people can go back to their normal activities right away.  Avoid strenuous physical activity and heavy lifting or pulling for about 5 hours after the procedure. Do not lift anything that is heavier than 10 lb (4.5 kg).  Athletes should avoid strenuous exercise for at least 12 hours.  Change positions slowly for the remainder of the day. This will help to prevent light-headedness or fainting.  If you feel light-headed, lie down until the feeling goes away. Eating and drinking  Be sure to eat well-balanced meals for the next 24 hours.  Drink enough fluid to keep your urine clear or pale yellow.  Avoid drinking alcohol on the day that you had the procedure. Care of the Needle Insertion Site  Keep your bandage dry. You can remove the bandage after about 5 hours or as directed by your health care provider.  If you have bleeding from the needle insertion site, elevate your arm and press firmly on the site until the bleeding stops.  If you have bruising at the site, apply ice to the area:  Put ice in a plastic bag.  Place a towel between your skin and the bag.  Leave the ice on for 20 minutes, 2-3 times a day for the first 24 hours.  If the swelling does not go away  after 24 hours, apply a warm, moist washcloth to the area for 20 minutes, 2-3 times a day. General instructions  Avoid smoking for at least 30 minutes after the procedure.  Keep all follow-up visits as directed by your health care provider. It is important to continue with further therapeutic phlebotomy treatments as directed. Contact a health care provider if:  You have redness, swelling, or pain at the needle insertion site.  You have fluid, blood, or pus coming from the needle insertion site.  You feel light-headed, dizzy, or nauseated, and the feeling does not go away.  You notice new bruising at the needle insertion site.  You feel weaker than normal.  You have a fever or chills. Get help right away if:  You have severe nausea or vomiting.  You have chest pain.  You have trouble breathing. This information is not intended to replace advice given to you by your health care provider. Make sure you discuss any questions you have with your health care provider. Document Released: 08/17/2010 Document Revised: 11/15/2015 Document Reviewed: 03/11/2014 Elsevier Interactive Patient Education  2017 Elsevier Inc.  

## 2016-05-27 ENCOUNTER — Other Ambulatory Visit: Payer: Self-pay | Admitting: Hematology & Oncology

## 2016-05-27 ENCOUNTER — Encounter: Payer: Self-pay | Admitting: Family Medicine

## 2016-05-27 ENCOUNTER — Telehealth: Payer: Self-pay | Admitting: Family Medicine

## 2016-05-27 DIAGNOSIS — F411 Generalized anxiety disorder: Secondary | ICD-10-CM

## 2016-05-27 DIAGNOSIS — N390 Urinary tract infection, site not specified: Secondary | ICD-10-CM

## 2016-05-27 NOTE — Telephone Encounter (Signed)
Received Rx request for Xanax 0.5mg  (take 1 tab po q6h prn for anxiety)  Last Rf: 04/27/2016 Last Ov: 12/09/2015 Next Ov: 06/08/2016 UDS: None  Forwarded to Provider for review, approval or denial.

## 2016-05-27 NOTE — Telephone Encounter (Signed)
Called again and line vusy.

## 2016-05-27 NOTE — Telephone Encounter (Signed)
Called to inform hardcopy is ready for pickup/line busy. Needs uds/contract.

## 2016-05-28 NOTE — Telephone Encounter (Signed)
Called the patient again. No answer/no voice mail.

## 2016-05-31 ENCOUNTER — Other Ambulatory Visit: Payer: Self-pay | Admitting: *Deleted

## 2016-05-31 ENCOUNTER — Other Ambulatory Visit: Payer: Self-pay | Admitting: Family Medicine

## 2016-05-31 DIAGNOSIS — M797 Fibromyalgia: Secondary | ICD-10-CM

## 2016-05-31 DIAGNOSIS — F411 Generalized anxiety disorder: Secondary | ICD-10-CM

## 2016-05-31 DIAGNOSIS — I358 Other nonrheumatic aortic valve disorders: Secondary | ICD-10-CM

## 2016-05-31 DIAGNOSIS — D45 Polycythemia vera: Secondary | ICD-10-CM

## 2016-05-31 MED ORDER — OXYCODONE HCL 15 MG PO TABS: 15.0000 mg | ORAL_TABLET | Freq: Four times a day (QID) | ORAL | 0 refills | Status: DC | PRN

## 2016-05-31 MED ORDER — OXYCODONE HCL ER 20 MG PO T12A: 20.0000 mg | EXTENDED_RELEASE_TABLET | Freq: Three times a day (TID) | ORAL | 0 refills | Status: DC

## 2016-05-31 MED ORDER — OXYCODONE HCL ER 10 MG PO T12A: 10.0000 mg | EXTENDED_RELEASE_TABLET | Freq: Two times a day (BID) | ORAL | 0 refills | Status: DC

## 2016-05-31 NOTE — Telephone Encounter (Signed)
Patient notified that rx was sent to pharmacy and we get the UDS and contract signed on 06/08/16

## 2016-05-31 NOTE — Telephone Encounter (Signed)
Pt's spouse called in to follow up on refill request. Advised of the message below. He says that they live about 30 minutes from office and is unable to come in to sign UDS/contract or pick up Rx. He would like to have sent in to pharmacy. He would like to know if Rx  Could just be called in and they sign on scheduled visit which is on 06/08/16?   Informed him that I will ask and have someone to give him a call back     CB: [phone]

## 2016-05-31 NOTE — Telephone Encounter (Addendum)
Spouse calling back regarding message below, please advise Robers,David best [phone]

## 2016-06-02 ENCOUNTER — Other Ambulatory Visit (HOSPITAL_BASED_OUTPATIENT_CLINIC_OR_DEPARTMENT_OTHER): Payer: Medicare Other

## 2016-06-02 ENCOUNTER — Telehealth: Payer: Self-pay | Admitting: *Deleted

## 2016-06-02 DIAGNOSIS — D45 Polycythemia vera: Secondary | ICD-10-CM | POA: Diagnosis present

## 2016-06-02 LAB — CBC WITH DIFFERENTIAL (CANCER CENTER ONLY)
BASO#: 0 10*3/uL (ref 0.0–0.2)
BASO%: 0.2 % (ref 0.0–2.0)
EOS%: 8 % — AB (ref 0.0–7.0)
Eosinophils Absolute: 0.4 10*3/uL (ref 0.0–0.5)
HCT: 22 % — ABNORMAL LOW (ref 34.8–46.6)
HEMOGLOBIN: 5.5 g/dL — AB (ref 11.6–15.9)
LYMPH#: 0.9 10*3/uL (ref 0.9–3.3)
LYMPH%: 18.4 % (ref 14.0–48.0)
MCH: 17.6 pg — ABNORMAL LOW (ref 26.0–34.0)
MCHC: 25 g/dL — ABNORMAL LOW (ref 32.0–36.0)
MCV: 70 fL — ABNORMAL LOW (ref 81–101)
MONO#: 0.7 10*3/uL (ref 0.1–0.9)
MONO%: 13.5 % — AB (ref 0.0–13.0)
NEUT%: 59.9 % (ref 39.6–80.0)
NEUTROS ABS: 3.1 10*3/uL (ref 1.5–6.5)
Platelets: 174 10*3/uL (ref 145–400)
RBC: 3.13 10*6/uL — AB (ref 3.70–5.32)
RDW: 20.5 % — ABNORMAL HIGH (ref 11.1–15.7)
WBC: 5.1 10*3/uL (ref 3.9–10.0)

## 2016-06-02 NOTE — Telephone Encounter (Signed)
Critical Value HGB 5.5 Dr Marin Olp notified. No orders at this time

## 2016-06-02 NOTE — Telephone Encounter (Signed)
Husband called about xanax again.  Pharmacy did not get fax.  Spoke with pharmacist and gave her rx.  She stated that patient is taking temazepam, pain meds, and also the alprazolam.  She was concerned about patient could stop breathing and can we may send in narcan.  Please advise.

## 2016-06-03 ENCOUNTER — Other Ambulatory Visit: Payer: Self-pay | Admitting: Hematology & Oncology

## 2016-06-03 NOTE — Telephone Encounter (Signed)
There is a black box warning with temazapam, oxycodone and xanax.  They can not be given together  We will need to discuss meds with pt and may need to try to get her off of some I will send to Dr Marin Olp for his input as well

## 2016-06-04 NOTE — Telephone Encounter (Signed)
Spoke with Roselyn Reef and she said that Dr. Marin Olp is aware of the situation and would like to speak to Dr. Etter Sjogren.  Roselyn Reef said she would have Dr. Marin Olp call Dr. Etter Sjogren to discuss further.

## 2016-06-08 ENCOUNTER — Encounter: Payer: Self-pay | Admitting: Family Medicine

## 2016-06-08 ENCOUNTER — Other Ambulatory Visit: Payer: Self-pay | Admitting: Family Medicine

## 2016-06-08 ENCOUNTER — Ambulatory Visit (INDEPENDENT_AMBULATORY_CARE_PROVIDER_SITE_OTHER): Payer: Medicare Other | Admitting: Family Medicine

## 2016-06-08 VITALS — BP 122/58 | HR 68 | Temp 98.4°F | Resp 16 | Ht 63.0 in | Wt 163.2 lb

## 2016-06-08 DIAGNOSIS — E038 Other specified hypothyroidism: Secondary | ICD-10-CM

## 2016-06-08 DIAGNOSIS — F419 Anxiety disorder, unspecified: Secondary | ICD-10-CM | POA: Diagnosis not present

## 2016-06-08 DIAGNOSIS — D45 Polycythemia vera: Secondary | ICD-10-CM | POA: Diagnosis not present

## 2016-06-08 DIAGNOSIS — G4700 Insomnia, unspecified: Secondary | ICD-10-CM | POA: Diagnosis not present

## 2016-06-08 LAB — TSH: TSH: 0.03 u[IU]/mL — AB (ref 0.35–4.50)

## 2016-06-08 MED ORDER — ZOSTER VAC RECOMB ADJUVANTED 50 MCG/0.5ML IM SUSR: 0.5000 mL | Freq: Once | INTRAMUSCULAR | 1 refills | Status: DC

## 2016-06-08 MED ORDER — ESCITALOPRAM OXALATE 10 MG PO TABS: 10.0000 mg | ORAL_TABLET | Freq: Every day | ORAL | 2 refills | Status: DC

## 2016-06-08 MED ORDER — ZOSTER VAC RECOMB ADJUVANTED 50 MCG/0.5ML IM SUSR: 0.5000 mL | Freq: Once | INTRAMUSCULAR | 1 refills | Status: AC

## 2016-06-08 MED ORDER — TEMAZEPAM 30 MG PO CAPS: ORAL_CAPSULE | ORAL | 0 refills | Status: DC

## 2016-06-08 NOTE — Assessment & Plan Note (Signed)
Start lexapro 10 mg daily Wean off xanax

## 2016-06-08 NOTE — Assessment & Plan Note (Signed)
Discussed the problem of taking pain meds with xanax and temazepam ----  Will wean off xanax over next 3 months and then cut down on temazepam Pt is aware and agreeable

## 2016-06-08 NOTE — Progress Notes (Signed)
Patient ID: [name], female   DOB: May 24, 1945, 71 y.o.   MRN: [id number]     Subjective:  I acted as a Education administrator for Dr. Carollee Herter.  [name], [name]   Patient ID: [name], female    DOB: Jun 19, 1945, 71 y.o.   MRN: [id number]  Chief Complaint  Patient presents with  . Hyperlipidemia  . Hypertension    HPI  Patient is in today for follow up blood pressure and cholesterol. Takes xanax for back pain.  Only takes the xanax 1-2 times a day, mostly in the afternoon and at bedtime.  Also takes the the temazepam at bedtime.  Oncology has been writing the oxycodone.    Patient Care Team: Ann Held, DO as PCP - General (Family Medicine) Volanda Napoleon, MD as Attending Physician (Hematology) Judeth Horn, MD (General Surgery) Hoyt Koch, MD as Referring Physician (Obstetrics and Gynecology) Marica Otter, OD (Optometry) Druscilla Brownie, MD as Consulting Physician (Dermatology)   Past Medical History:  Diagnosis Date  . Acute cholecystitis 10/07/2012   s/p pec drain due to recent CVA, pending chole 11/2012  . Anemia    "due to polycythemia vera" (12/28/2012)  . Anxiety   . Aortic valve sclerosis    by echocardiogram 12/04/2009  . Asthma    "wheeze occasionally; I don't have asthma" (12/28/2012)  . Chronic back pain   . Chronic constipation    takes Mineral Oil,Juice,Enema(prn),and Miralax(Prn) and Cascara nightly  . Complex partial seizure disorder ([place]) 10/15/2012  . CVA (cerebral infarction) 10-05-12   rHP, improved, complicated by sz event x 1  . Difficult intubation    grade 3 airway  . Exertional dyspnea   . Fibromyalgia    chronic pain syndrome  . GERD (gastroesophageal reflux disease)   . Heart murmur    "flow mumur" (12/28/2012)  . History of blood transfusion 09/2012; 12/28/2012  . Hyperlipidemia    "not since carotid OR" (12/28/2012)  . Hypertension    "not since carotid OR" (12/28/2012)  . Hypothyroidism   . Insomnia    takes restoril nightly and Xanax   . 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 Occlusion and stenosis of carotid artery without mention of cerebral infarction 10/26/2012  . PAC (premature atrial contraction)   . 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 PONV (postoperative nausea and vomiting)    "I vomit for 5 days straight w/certain RX w/thanes" (12/28/2012)  . Postmenopausal state    on hormone replacement therapy  . Seasonal allergies    takes Allegra in am and Benadryl at night  . Seizures ([place])    only seizure was 10/14/12 "after carotid endarterectomy";takes Keppra daily  . Stroke [facility]) 09/2012   "after they put drain in my gallbladder"; residual is "haven't felt well enough to tell since stroke to tell; weak already; weaker when I'm tired" (12/28/2012)    Past Surgical History:  Procedure Laterality Date  . ABDOMINAL HYSTERECTOMY  1996  . CAROTID ENDARTERECTOMY Left 10-10-12  . CHOLECYSTECTOMY N/A 11/29/2012   Procedure: ATTEMPTED LAPAROSCOPIC CHOLECYSTECTOMY ;  Surgeon: Gwenyth Ober, MD;  Location: [place];  Service: General;  Laterality: N/A;  . CHOLECYSTECTOMY N/A 11/29/2012   Procedure: CHOLECYSTECTOMY, COMMON BILE DUCT EXPLORATION, T-TUBE PLACEMENT;  Surgeon: Gwenyth Ober, MD;  Location: [place];  Service: General;  Laterality: N/A;  . COLONOSCOPY    . COLONOSCOPY N/A 05/14/2013   Procedure: COLONOSCOPY;  Surgeon: Inda Castle, MD;  Location: [facility];  Service: Endoscopy;  Laterality: N/A;  . ENDARTERECTOMY Left 10/10/2012   Procedure: ENDARTERECTOMY CAROTID- LEFT NECK WITH GREATER SAPHENOUS VEIN PATCH LEFT LEG;  Surgeon: Elam Dutch, MD;  Location: [facility] OR;  Service: Vascular;  Laterality: Left;  . ESOPHAGOGASTRODUODENOSCOPY N/A 01/02/2013   Procedure: ESOPHAGOGASTRODUODENOSCOPY (EGD);  Surgeon: Inda Castle, MD;  Location: [place];  Service: Endoscopy;  Laterality: N/A;  . insertion of drain  10/02/12   right low abdomen and draining pus  . NASAL SEPTUM SURGERY     late 70's  .  PICC line placed     10/15/12  . TONSILLECTOMY     age 49  . TUBAL LIGATION  ~ 1972    Family History  Problem Relation Age of Onset  . Heart attack Father   . Heart disease Father     Heart Disease before age 51  . Coronary artery disease Mother     had aortic valve replacement  . Heart disease Mother   . Heart attack Mother   . Hyperlipidemia Sister   . Hypertension Sister     Social History   Social History  . Marital status: Married    Spouse name: [name]  . Number of children: 0  . Years of education: college   Occupational History  . retired    Social History Main Topics  . Smoking status: Never Smoker  . Smokeless tobacco: Never Used     Comment: never used tobacco  . Alcohol use No  . Drug use: No  . Sexual activity: Not Currently   Other Topics Concern  . Not on file   Social History Narrative  . No narrative on file    Outpatient Medications Prior to Visit  Medication Sig Dispense Refill  . ALPRAZolam (XANAX) 0.5 MG tablet TAKE 1 TABLET BY MOUTH EVERY 6 HOURS AS NEEDED FOR ANXIETY 60 tablet 1  . aspirin 325 MG tablet Take 325 mg by mouth daily after breakfast. "to thin  Blood"    . bisoprolol (ZEBETA) 10 MG tablet Take 1 tablet (10 mg total) by mouth daily. 90 tablet 3  . Cascara Sagr[place] 450 MG CAPS Take 900 mg by mouth at bedtime.    . ciprofloxacin (CIPRO) 500 MG tablet TAKE 1 TABLET BY MOUTH TWICE A DAY FOR 14 DAYS 28 tablet 2  . clindamycin (CLEOCIN) 300 MG capsule Take 2 capsules by mouth as directed.    . clopidogrel (PLAVIX) 75 MG tablet TAKE 1 TABLET (75 MG TOTAL) BY MOUTH DAILY. 30 tablet 3  . cyclobenzaprine (FLEXERIL) 10 MG tablet Take 0.5-1 tablets (5-10 mg total) by mouth every 6 (six) hours as needed for muscle spasms. 30 tablet 5  . diphenhydrAMINE (BENADRYL) 25 MG tablet Take 50 mg by mouth every 8 (eight) hours as needed. Patient may take 50 mg three times a day as needed for allergies    . docusate sodium (COLACE) 100 MG capsule Take  100 mg by mouth 3 (three) times daily with meals.     . famotidine (PEPCID) 20 MG tablet TAKE 1 TABLET BY MOUTH TWICE A DAY BEFORE A MEAL *4/29* 180 tablet 3  . fesoterodine (TOVIAZ) 4 MG TB24 tablet Take 1 tablet (4 mg total) by mouth daily. 30 tablet 2  . fexofenadine (ALLEGRA) 180 MG tablet Take 180 mg by mouth daily after breakfast.     . furosemide (LASIX) 40 MG tablet Take 1 tablet (40 mg total) by mouth 2 (two) times  daily. 180 tablet 3  . Garlic Oil 2585 MG CAPS Take 1,000 mg by mouth daily after breakfast. Reported on 06/03/2015    . guaiFENesin (MUCINEX) 600 MG 12 hr tablet Take 400 mg by mouth daily.     . hydrochlorothiazide (MICROZIDE) 12.5 MG capsule Take 1 capsule (12.5 mg total) by mouth daily. 90 capsule 3  . Ipratropium-Albuterol (COMBIVENT RESPIMAT) 20-100 MCG/ACT AERS respimat Inhale 2 puffs into the lungs 4 (four) times daily.    Marland Kitchen levETIRAcetam (KEPPRA) 250 MG tablet Take 1 tablet (250 mg total) by mouth 2 (two) times daily. 180 tablet 3  . lidocaine-prilocaine (EMLA) cream Apply 1 application topically as needed (to numb the phlebotomy site). 30 g 6  . lisinopril (PRINIVIL,ZESTRIL) 10 MG tablet Take 10 mg by mouth 2 (two) times daily as needed. Reported on 06/03/2015    . magnesium oxide (MAG-OX) 400 MG tablet Take 1,200 mg by mouth daily.    . mineral oil liquid Take 60 mLs by mouth at bedtime. With Juice    . Misc Natural Products (TART CHERRY ADVANCED PO) Take 1,200 mg by mouth 2 (two) times daily. Reported on 06/03/2015    . nitroGLYCERIN (NITROSTAT) 0.4 MG SL tablet Place 0.4 mg under the tongue every 5 (five) minutes as needed for chest pain.    . NONFORMULARY OR COMPOUNDED ITEM Cmp, lipid, cbcd , , tsh.magnesium -- dx hypothyroidism, hx cva, hypokalemia, 1 each 0  . ondansetron (ZOFRAN) 4 MG tablet Take 4 mg by mouth every 8 (eight) hours as needed for nausea or vomiting.    Marland Kitchen oxyCODONE (OXYCONTIN) 10 mg 12 hr tablet Take 1 tablet (10 mg total) by mouth 2 (two) times daily.  Take one tablet by mouth at 8am and one tablet by mouth at 4pm 60 tablet 0  . oxyCODONE (OXYCONTIN) 20 mg 12 hr tablet Take 1 tablet (20 mg total) by mouth 3 (three) times daily. Take 1 tablet by mouth at 12noon, 8pm and 4am 90 tablet 0  . oxyCODONE (ROXICODONE) 15 MG immediate release tablet Take 1 tablet (15 mg total) by mouth every 6 (six) hours as needed for pain. 90 tablet 0  . potassium chloride (MICRO-K) 10 MEQ CR capsule Take 1 tablet by mouth 9 times a day for 2 weeks and then 1 tablet by mouth eight times daily 254 capsule 5  . promethazine (PHENERGAN) 25 MG tablet Take 25 mg by mouth every 6 (six) hours as needed for nausea.    . Sennosides (SENNA LAX PO) Take 1 tablet by mouth 3 (three) times daily after meals.     Marland Kitchen thyroid (ARMOUR) 120 MG tablet Take 150 mg by mouth daily before breakfast.    . triamcinolone cream (KENALOG) 0.1 % Apply 1 application topically 2 (two) times daily as needed (to skin).    . Vitamin D, Cholecalciferol, 1000 UNITS TABS Take 4,000 Units by mouth every morning.    . temazepam (RESTORIL) 30 MG capsule TAKE 1 CAPSULE BY MOUTH AT BEDTIME AS NEEDED FOR SLEEP 90 capsule 1   No facility-administered medications prior to visit.     Allergies  Allergen Reactions  . Ethrane [Enflurane] Nausea And Vomiting  . Codeine Nausea Only  . Synthroid [Levothyroxine Sodium] Other (See Comments)    Not effective. Causes excessive sleepiness. Takes armour thyroid  . Calcium Channel Blockers Swelling, Palpitations and Hypertension  . Penicillins Rash    Review of Systems  Constitutional: Positive for malaise/fatigue. Negative for fever.  HENT: Negative  for congestion.   Eyes: Negative for blurred vision.  Respiratory: Negative for cough and shortness of breath.   Cardiovascular: Negative for chest pain, palpitations and leg swelling.  Gastrointestinal: Negative for vomiting.  Skin: Negative for rash.  Neurological: Negative for loss of consciousness and headaches.        Objective:    Physical Exam  Constitutional: She is oriented to person, place, and time. She appears well-developed and well-nourished. No distress.  HENT:  Head: Normocephalic and atraumatic.  Eyes: Conjunctivae are normal.  Neck: Normal range of motion. No thyromegaly present.  Cardiovascular: Normal rate and regular rhythm.   Pulmonary/Chest: Effort normal and breath sounds normal. She has no wheezes.  Abdominal: Soft. Bowel sounds are normal. There is no tenderness.  Musculoskeletal: Normal range of motion. She exhibits no edema or deformity.  Neurological: She is alert and oriented to person, place, and time.  Skin: Skin is warm and dry. She is not diaphoretic.  Psychiatric: She has a normal mood and affect.    BP (!) 122/58 (BP Location: Left Arm, Cuff Size: Normal)   Pulse 68   Temp 98.4 F (36.9 C) (Oral)   Resp 16   Ht '5\' 3"'$  (1.6 m)   Wt 163 lb 3.2 oz (74 kg)   SpO2 95%   BMI 28.91 kg/m  Wt Readings from Last 3 Encounters:  06/08/16 163 lb 3.2 oz (74 kg)  05/24/16 163 lb (73.9 kg)  03/24/16 164 lb 1.9 oz (74.4 kg)     Lab Results  Component Value Date   WBC 5.1 06/02/2016   HGB 5.5 (LL) 06/02/2016   HCT 22.0 (L) 06/02/2016   PLT 174 06/02/2016   GLUCOSE 139 (H) 05/24/2016   CHOL 78 (L) 01/21/2016   TRIG 54 01/21/2016   HDL 33 (L) 01/21/2016   LDLCALC 34 01/21/2016   ALT 6 05/24/2016   AST 12 05/24/2016   NA 135 05/24/2016   K 3.2 (L) 05/24/2016   CL 94 (L) 05/24/2016   CREATININE 0.84 05/24/2016   BUN 14 05/24/2016   CO2 34 (H) 05/24/2016   TSH 0.03 (L) 06/08/2016   INR 1.15 11/21/2012   HGBA1C 5.5 10/07/2012    Lab Results  Component Value Date   TSH 0.03 (L) 06/08/2016   Lab Results  Component Value Date   WBC 5.1 06/02/2016   HGB 5.5 (LL) 06/02/2016   HCT 22.0 (L) 06/02/2016   MCV 70 (L) 06/02/2016   PLT 174 06/02/2016   Lab Results  Component Value Date   NA 135 05/24/2016   K 3.2 (L) 05/24/2016   CHLORIDE 99 03/24/2016    CO2 34 (H) 05/24/2016   GLUCOSE 139 (H) 05/24/2016   BUN 14 05/24/2016   CREATININE 0.84 05/24/2016   BILITOT 0.6 05/24/2016   ALKPHOS 104 05/24/2016   AST 12 05/24/2016   ALT 6 05/24/2016   PROT 7.2 05/24/2016   ALBUMIN 4.2 05/24/2016   CALCIUM 9.7 05/24/2016   ANIONGAP 9 03/24/2016   EGFR 70 (L) 03/24/2016   GFR 94.60 11/10/2011   Lab Results  Component Value Date   CHOL 78 (L) 01/21/2016   Lab Results  Component Value Date   HDL 33 (L) 01/21/2016   Lab Results  Component Value Date   LDLCALC 34 01/21/2016   Lab Results  Component Value Date   TRIG 54 01/21/2016   Lab Results  Component Value Date   CHOLHDL 2.4 01/21/2016   Lab Results  Component Value Date  HGBA1C 5.5 10/07/2012       Assessment & Plan:  Start decrease on  Xanax  Month #1  0.'25mg'$   twice a day as needed  #60 Month #2  0.'125mg'$   twice a day #30 Month #3  0.'125mg'$   once a day #15  Start lexapro '10mg'$  at bedtime.    Problem List Items Addressed This Visit      Unprioritized   Anxiety    Start lexapro 10 mg daily Wean off xanax      Relevant Medications   escitalopram (LEXAPRO) 10 MG tablet   Insomnia    Discussed the problem of taking pain meds with xanax and temazepam ----  Will wean off xanax over next 3 months and then cut down on temazepam Pt is aware and agreeable       Relevant Medications   temazepam (RESTORIL) 30 MG capsule   Other specified hypothyroidism - Primary   Relevant Medications   ARMOUR THYROID 30 MG tablet   thyroid (ARMOUR) 120 MG tablet   Other Relevant Orders   TSH (Completed)   Polycythemia vera (HCC)    tx per oncology Did d/w Dr Marin Olp her meds--- oxycodone, temazepam and xanax Will wean off xanax over next few months and then temazepam Will start lexapro today for anxiety / sleep rto 3 months or sooner prn          I am having Ms. Eschbach start on escitalopram. I am also having her maintain her fexofenadine, aspirin, diphenhydrAMINE,  docusate sodium, mineral oil, Cascara Sagr[place], Garlic Oil, magnesium oxide, promethazine, lisinopril, triamcinolone cream, nitroGLYCERIN, Sennosides (SENNA LAX PO), Vitamin D (Cholecalciferol), lidocaine-prilocaine, Misc Natural Products (TART CHERRY ADVANCED PO), Ipratropium-Albuterol, guaiFENesin, famotidine, clindamycin, NONFORMULARY OR COMPOUNDED ITEM, cyclobenzaprine, bisoprolol, furosemide, hydrochlorothiazide, levETIRAcetam, fesoterodine, potassium chloride, thyroid, ondansetron, ciprofloxacin, ALPRAZolam, oxyCODONE, oxyCODONE, oxyCODONE, clopidogrel, ARMOUR THYROID, thyroid, temazepam, and Zoster Vac Recomb Adjuvanted.  Meds ordered this encounter  Medications  . ARMOUR THYROID 30 MG tablet    Sig: TAKE 1 TABLET (30 MG TOTAL) BY MOUTH DAILY BEFORE BREAKFAST.    Refill:  1  . thyroid (ARMOUR) 120 MG tablet    Sig: Take 120 mg by mouth daily before breakfast.  . DISCONTD: temazepam (RESTORIL) 30 MG capsule    Sig: TAKE 1 CAPSULE BY MOUTH AT BEDTIME AS NEEDED FOR SLEEP    Dispense:  90 capsule    Refill:  0    Not to exceed 4 additional fills before 02/25/2016.  Marland Kitchen escitalopram (LEXAPRO) 10 MG tablet    Sig: Take 1 tablet (10 mg total) by mouth daily.    Dispense:  30 tablet    Refill:  2  . DISCONTD: Zoster Vac Recomb Adjuvanted (SHINGRIX) 50 MCG SUSR    Sig: Inject 0.5 mLs into the muscle once.    Dispense:  1 each    Refill:  1  . temazepam (RESTORIL) 30 MG capsule    Sig: TAKE 1 CAPSULE BY MOUTH AT BEDTIME AS NEEDED FOR SLEEP    Dispense:  90 capsule    Refill:  0    Not to exceed 4 additional fills before 02/25/2016.  Marland Kitchen Zoster Vac Recomb Adjuvanted (SHINGRIX) 50 MCG SUSR    Sig: Inject 0.5 mLs into the muscle once.    Dispense:  1 each    Refill:  1    CMA served as scribe during this visit. History, Physical and Plan performed by medical provider. Documentation and orders reviewed and attested to.  Rosalita Chessman  Chase, DO

## 2016-06-08 NOTE — Patient Instructions (Addendum)
Start decrease on  Xanax  Month #1  0.25mg   twice a day as needed Month #2  0.125mg   twice a day Month #3  0.125mg   once a day  Start lexapro 10mg  at bedtime

## 2016-06-08 NOTE — Assessment & Plan Note (Signed)
tx per oncology Did d/w Dr Marin Olp her meds--- oxycodone, temazepam and xanax Will wean off xanax over next few months and then temazepam Will start lexapro today for anxiety / sleep rto 3 months or sooner prn

## 2016-06-08 NOTE — Progress Notes (Signed)
Pre visit review using our clinic review tool, if applicable. No additional management support is needed unless otherwise documented below in the visit note. 

## 2016-06-09 ENCOUNTER — Other Ambulatory Visit: Payer: Self-pay | Admitting: Family Medicine

## 2016-06-09 MED ORDER — ARMOUR THYROID 30 MG PO TABS: 30.0000 mg | ORAL_TABLET | Freq: Every day | ORAL | 0 refills | Status: AC

## 2016-06-15 DIAGNOSIS — L814 Other melanin hyperpigmentation: Secondary | ICD-10-CM | POA: Diagnosis not present

## 2016-06-15 DIAGNOSIS — L738 Other specified follicular disorders: Secondary | ICD-10-CM | POA: Diagnosis not present

## 2016-06-15 DIAGNOSIS — L821 Other seborrheic keratosis: Secondary | ICD-10-CM | POA: Diagnosis not present

## 2016-06-15 DIAGNOSIS — D225 Melanocytic nevi of trunk: Secondary | ICD-10-CM | POA: Diagnosis not present

## 2016-06-15 DIAGNOSIS — Z85820 Personal history of malignant melanoma of skin: Secondary | ICD-10-CM | POA: Diagnosis not present

## 2016-06-16 ENCOUNTER — Telehealth: Payer: Self-pay | Admitting: Family Medicine

## 2016-06-16 ENCOUNTER — Telehealth: Payer: Self-pay | Admitting: *Deleted

## 2016-06-16 ENCOUNTER — Other Ambulatory Visit (HOSPITAL_BASED_OUTPATIENT_CLINIC_OR_DEPARTMENT_OTHER): Payer: Medicare Other

## 2016-06-16 DIAGNOSIS — D45 Polycythemia vera: Secondary | ICD-10-CM | POA: Diagnosis present

## 2016-06-16 LAB — CBC WITH DIFFERENTIAL (CANCER CENTER ONLY)
BASO#: 0 10*3/uL (ref 0.0–0.2)
BASO%: 0.2 % (ref 0.0–2.0)
EOS%: 8.7 % — AB (ref 0.0–7.0)
Eosinophils Absolute: 0.5 10*3/uL (ref 0.0–0.5)
HCT: 22 % — ABNORMAL LOW (ref 34.8–46.6)
HGB: 5.4 g/dL — CL (ref 11.6–15.9)
LYMPH#: 0.8 10*3/uL — ABNORMAL LOW (ref 0.9–3.3)
LYMPH%: 15.4 % (ref 14.0–48.0)
MCH: 17.3 pg — ABNORMAL LOW (ref 26.0–34.0)
MCHC: 24.5 g/dL — ABNORMAL LOW (ref 32.0–36.0)
MCV: 71 fL — AB (ref 81–101)
MONO#: 0.7 10*3/uL (ref 0.1–0.9)
MONO%: 12.4 % (ref 0.0–13.0)
NEUT#: 3.4 10*3/uL (ref 1.5–6.5)
NEUT%: 63.3 % (ref 39.6–80.0)
Platelets: 188 10*3/uL (ref 145–400)
RBC: 3.12 10*6/uL — ABNORMAL LOW (ref 3.70–5.32)
RDW: 20.6 % — AB (ref 11.1–15.7)
WBC: 5.3 10*3/uL (ref 3.9–10.0)

## 2016-06-16 NOTE — Telephone Encounter (Signed)
Critical Value HGB 5.4 Dr Marin Olp notified. No orders at this time

## 2016-06-16 NOTE — Telephone Encounter (Signed)
Pt's spouse dropped off document for provider to have on file and to be informed as well, document in a white small envelope. Document put at front office tray.

## 2016-06-17 NOTE — Telephone Encounter (Signed)
Form received and forwarded to PCP for review.

## 2016-06-18 ENCOUNTER — Telehealth: Payer: Self-pay | Admitting: Family Medicine

## 2016-06-18 NOTE — Telephone Encounter (Signed)
Noted-- will discuss at next ov

## 2016-06-18 NOTE — Telephone Encounter (Signed)
PCP received letter/Log from husband regarding patient. Called the patient to inform the patient/husband of PCP's response to letter. PCP stated "Hyperthyroid- PCP wanted to decrease dose of thyroid medication but patient has been reluctant in the past.  She would do 90 mg daily #30 with 2 refills and recheck in 2 months, can refer to endo if she would like. If she is having trouble with xanax can refer back to neuro to see if there is another way around that" (Copied from PCP handwritten messsge on letter)  The husband stated that this phone call only caused him confusion.  He stated they have an appt. With dr. Etter Sjogren in 90 days and he did ask for my name and stated he would let her know that this phone call only confused him. Letter/log labeled properly and sent to scan.

## 2016-06-22 DIAGNOSIS — H25813 Combined forms of age-related cataract, bilateral: Secondary | ICD-10-CM | POA: Diagnosis not present

## 2016-06-22 DIAGNOSIS — H5211 Myopia, right eye: Secondary | ICD-10-CM | POA: Diagnosis not present

## 2016-06-22 DIAGNOSIS — H348122 Central retinal vein occlusion, left eye, stable: Secondary | ICD-10-CM | POA: Diagnosis not present

## 2016-06-22 DIAGNOSIS — H52221 Regular astigmatism, right eye: Secondary | ICD-10-CM | POA: Diagnosis not present

## 2016-06-22 DIAGNOSIS — H04123 Dry eye syndrome of bilateral lacrimal glands: Secondary | ICD-10-CM | POA: Diagnosis not present

## 2016-06-24 ENCOUNTER — Other Ambulatory Visit: Payer: Self-pay | Admitting: Family Medicine

## 2016-06-24 DIAGNOSIS — K219 Gastro-esophageal reflux disease without esophagitis: Secondary | ICD-10-CM

## 2016-06-26 ENCOUNTER — Other Ambulatory Visit: Payer: Self-pay | Admitting: Family Medicine

## 2016-06-26 DIAGNOSIS — G4700 Insomnia, unspecified: Secondary | ICD-10-CM

## 2016-06-26 DIAGNOSIS — R600 Localized edema: Secondary | ICD-10-CM

## 2016-06-26 DIAGNOSIS — K219 Gastro-esophageal reflux disease without esophagitis: Secondary | ICD-10-CM

## 2016-06-28 NOTE — Telephone Encounter (Signed)
Faxed hardcopy for Temazepam to [facility] in [place]

## 2016-06-30 ENCOUNTER — Other Ambulatory Visit (HOSPITAL_BASED_OUTPATIENT_CLINIC_OR_DEPARTMENT_OTHER): Payer: Medicare Other

## 2016-06-30 ENCOUNTER — Telehealth: Payer: Self-pay | Admitting: Family Medicine

## 2016-06-30 DIAGNOSIS — D45 Polycythemia vera: Secondary | ICD-10-CM | POA: Diagnosis present

## 2016-06-30 LAB — CBC WITH DIFFERENTIAL (CANCER CENTER ONLY)
BASO#: 0 10*3/uL (ref 0.0–0.2)
BASO%: 0.3 % (ref 0.0–2.0)
EOS%: 11.8 % — ABNORMAL HIGH (ref 0.0–7.0)
Eosinophils Absolute: 0.5 10*3/uL (ref 0.0–0.5)
HCT: 22.1 % — ABNORMAL LOW (ref 34.8–46.6)
HGB: 5.3 g/dL — CL (ref 11.6–15.9)
LYMPH#: 0.6 10*3/uL — ABNORMAL LOW (ref 0.9–3.3)
LYMPH%: 15.8 % (ref 14.0–48.0)
MCH: 17.4 pg — ABNORMAL LOW (ref 26.0–34.0)
MCHC: 24 g/dL — ABNORMAL LOW (ref 32.0–36.0)
MCV: 73 fL — AB (ref 81–101)
MONO#: 0.5 10*3/uL (ref 0.1–0.9)
MONO%: 12.3 % (ref 0.0–13.0)
NEUT#: 2.4 10*3/uL (ref 1.5–6.5)
NEUT%: 59.8 % (ref 39.6–80.0)
Platelets: 162 10*3/uL (ref 145–400)
RBC: 3.05 10*6/uL — AB (ref 3.70–5.32)
RDW: 21 % — AB (ref 11.1–15.7)
WBC: 4 10*3/uL (ref 3.9–10.0)

## 2016-06-30 NOTE — Telephone Encounter (Signed)
Pt's spouse dropped off document for PCP to have on chart for pt. Document in a white envelope sealed. Document put at front office tray.

## 2016-07-02 MED ORDER — ALPRAZOLAM 0.25 MG PO TABS: 0.2500 mg | ORAL_TABLET | Freq: Two times a day (BID) | ORAL | 0 refills | Status: DC

## 2016-07-02 NOTE — Telephone Encounter (Signed)
PCP received letter. Patients husband having difficulty breaking the current alprazolam prescription and request to change.  PCP advised to change alprazolam to 0.25 mg po bid  #60 with 0 refills.  Will do and faxed to pharmacy [facility]. At [phone], Received fax confirmation and is stapled to original.. Called the patient to inform but line busy. [phone]

## 2016-07-02 NOTE — Telephone Encounter (Signed)
Called the patient no answer/line busy.

## 2016-07-03 ENCOUNTER — Other Ambulatory Visit: Payer: Self-pay | Admitting: Family Medicine

## 2016-07-03 DIAGNOSIS — E039 Hypothyroidism, unspecified: Secondary | ICD-10-CM

## 2016-07-12 DIAGNOSIS — Z1289 Encounter for screening for malignant neoplasm of other sites: Secondary | ICD-10-CM | POA: Diagnosis not present

## 2016-07-12 DIAGNOSIS — Z1212 Encounter for screening for malignant neoplasm of rectum: Secondary | ICD-10-CM | POA: Diagnosis not present

## 2016-07-12 DIAGNOSIS — K644 Residual hemorrhoidal skin tags: Secondary | ICD-10-CM | POA: Diagnosis not present

## 2016-07-14 ENCOUNTER — Other Ambulatory Visit (HOSPITAL_BASED_OUTPATIENT_CLINIC_OR_DEPARTMENT_OTHER): Payer: Medicare Other

## 2016-07-14 ENCOUNTER — Ambulatory Visit (HOSPITAL_BASED_OUTPATIENT_CLINIC_OR_DEPARTMENT_OTHER): Payer: Medicare Other | Admitting: Hematology & Oncology

## 2016-07-14 ENCOUNTER — Telehealth: Payer: Self-pay | Admitting: *Deleted

## 2016-07-14 DIAGNOSIS — Z8673 Personal history of transient ischemic attack (TIA), and cerebral infarction without residual deficits: Secondary | ICD-10-CM

## 2016-07-14 DIAGNOSIS — G4700 Insomnia, unspecified: Secondary | ICD-10-CM

## 2016-07-14 DIAGNOSIS — D45 Polycythemia vera: Secondary | ICD-10-CM | POA: Diagnosis present

## 2016-07-14 DIAGNOSIS — M62838 Other muscle spasm: Secondary | ICD-10-CM

## 2016-07-14 DIAGNOSIS — M797 Fibromyalgia: Secondary | ICD-10-CM

## 2016-07-14 DIAGNOSIS — I358 Other nonrheumatic aortic valve disorders: Secondary | ICD-10-CM

## 2016-07-14 LAB — CBC WITH DIFFERENTIAL (CANCER CENTER ONLY)
BASO#: 0 10*3/uL (ref 0.0–0.2)
BASO%: 0.3 % (ref 0.0–2.0)
EOS%: 13 % — AB (ref 0.0–7.0)
Eosinophils Absolute: 1 10*3/uL — ABNORMAL HIGH (ref 0.0–0.5)
HEMATOCRIT: 25.4 % — AB (ref 34.8–46.6)
HGB: 6.2 g/dL — CL (ref 11.6–15.9)
LYMPH#: 1.2 10*3/uL (ref 0.9–3.3)
LYMPH%: 15.4 % (ref 14.0–48.0)
MCH: 17.4 pg — ABNORMAL LOW (ref 26.0–34.0)
MCHC: 24.4 g/dL — AB (ref 32.0–36.0)
MCV: 71 fL — AB (ref 81–101)
MONO#: 0.9 10*3/uL (ref 0.1–0.9)
MONO%: 12.5 % (ref 0.0–13.0)
NEUT#: 4.4 10*3/uL (ref 1.5–6.5)
NEUT%: 58.8 % (ref 39.6–80.0)
RBC: 3.56 10*6/uL — ABNORMAL LOW (ref 3.70–5.32)
RDW: 20.3 % — AB (ref 11.1–15.7)
WBC: 7.5 10*3/uL (ref 3.9–10.0)

## 2016-07-14 LAB — CMP (CANCER CENTER ONLY)
ALT(SGPT): 16 U/L (ref 10–47)
AST: 23 U/L (ref 11–38)
Albumin: 3.7 g/dL (ref 3.3–5.5)
Alkaline Phosphatase: 114 U/L — ABNORMAL HIGH (ref 26–84)
BUN: 15 mg/dL (ref 7–22)
CALCIUM: 8.7 mg/dL (ref 8.0–10.3)
CO2: 31 meq/L (ref 18–33)
Chloride: 97 mEq/L — ABNORMAL LOW (ref 98–108)
Creat: 1 mg/dl (ref 0.6–1.2)
GLUCOSE: 94 mg/dL (ref 73–118)
POTASSIUM: 3.3 meq/L (ref 3.3–4.7)
Sodium: 137 mEq/L (ref 128–145)
Total Bilirubin: 0.8 mg/dl (ref 0.20–1.60)
Total Protein: 6.6 g/dL (ref 6.4–8.1)

## 2016-07-14 MED ORDER — CYCLOBENZAPRINE HCL 10 MG PO TABS: 5.0000 mg | ORAL_TABLET | Freq: Four times a day (QID) | ORAL | 5 refills | Status: AC | PRN

## 2016-07-14 MED ORDER — TEMAZEPAM 30 MG PO CAPS: 30.0000 mg | ORAL_CAPSULE | Freq: Every evening | ORAL | 0 refills | Status: DC | PRN

## 2016-07-14 MED ORDER — OXYCODONE HCL ER 20 MG PO T12A: 20.0000 mg | EXTENDED_RELEASE_TABLET | Freq: Three times a day (TID) | ORAL | 0 refills | Status: DC

## 2016-07-14 MED ORDER — OXYCODONE HCL ER 10 MG PO T12A: 10.0000 mg | EXTENDED_RELEASE_TABLET | Freq: Two times a day (BID) | ORAL | 0 refills | Status: DC

## 2016-07-14 MED ORDER — ALPRAZOLAM 0.25 MG PO TABS: 0.2500 mg | ORAL_TABLET | Freq: Two times a day (BID) | ORAL | 0 refills | Status: DC

## 2016-07-14 MED ORDER — OXYCODONE HCL 15 MG PO TABS: 15.0000 mg | ORAL_TABLET | Freq: Four times a day (QID) | ORAL | 0 refills | Status: DC | PRN

## 2016-07-14 NOTE — Telephone Encounter (Signed)
Critical Value HGB 6.2  Dr Marin Olp notified. No orders at this time

## 2016-07-14 NOTE — Progress Notes (Signed)
Hematology and Oncology Follow Up Visit  [name] [id number] [birth year] 71 y.o. 07/14/2016   Principle Diagnosis:  Polycythemia vera -- hyperviscosity variant. 2. 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  Current Therapy:    Phlebotomy to maintain hemoglobin below 6.0     Interim History:  [name] is back for followup. She is not feeling all that well. She can ease be phlebotomized today.  She has not been sleeping all that well. She really gets anxious when she does not sleep. She gets quite emotional when she does not sleep.  She is definitely iron deficient. We have, as this by phlebotomizing Sarah.   She has had no problems with nausea or vomiting. She's had no change in bowel or bladder habits. She's had no chest discomfort. She's had no shortness of breath. Thankfully, she's had no issues with influenza.   She has occasional leg swelling. She has occasional palpitations.    Overall, Sarah performance status is ECOG 1.   Medications:Allergies:  Allergies  Allergen Reactions  . Ethrane [Enflurane] Nausea And Vomiting  . Codeine Nausea Only  . Synthroid [Levothyroxine Sodium] Other (See Comments)    Not effective. Causes excessive sleepiness. Takes armour thyroid  . Calcium Channel Blockers Swelling, Palpitations and Hypertension  . Penicillins Rash    Past Medical History, Surgical history, Social history, and Family History were reviewed and updated.  Review of Systems: As above  Physical Exam:  weight is 164 lb (74.4 kg). Sarah oral temperature is 98.1 F (36.7 C). Sarah blood pressure is 117/30 (abnormal) and Sarah pulse is 71. Sarah respiration is 19 and oxygen saturation is 100%.   Well-developed and well-nourished white female in no obvious distress. Head and neck exam shows pale conjunctiva. There is no oral lesions. There is no adenopathy in the neck. Lungs are clear bilaterally. Cardiac exam regular rate and rhythm with a  1/6 systolic ejection murmur. Abdomen is soft. Has good bowel sounds. There is no palpable liver or spleen tip appreciative well-healed laparotomy scars. Back exam no tenderness over the spine ribs or hips. Extremities shows no clubbing cyanosis or edema. Neurological exam shows no focal neurological deficits. Skin exam no rashes, ecchymoses or petechia.  Lab Results  Component Value Date   WBC 7.5 07/14/2016   HGB 6.2 (LL) 07/14/2016   HCT 25.4 (L) 07/14/2016   MCV 71 (L) 07/14/2016   PLT 178 Platelet count confirmed by slide estimate 07/14/2016     Chemistry      Component Value Date/Time   NA 137 07/14/2016 1256   NA 138 03/24/2016 1306   K 3.3 07/14/2016 1256   K 3.3 (L) 03/24/2016 1306   CL 97 (L) 07/14/2016 1256   CO2 31 07/14/2016 1256   CO2 31 (H) 03/24/2016 1306   BUN 15 07/14/2016 1256   BUN 8.2 03/24/2016 1306   CREATININE 1.0 07/14/2016 1256   CREATININE 0.8 03/24/2016 1306      Component Value Date/Time   CALCIUM 8.7 07/14/2016 1256   CALCIUM 9.2 03/24/2016 1306   ALKPHOS 114 (H) 07/14/2016 1256   ALKPHOS 114 03/24/2016 1306   AST 23 07/14/2016 1256   AST 15 03/24/2016 1306   ALT 16 07/14/2016 1256   ALT 7 03/24/2016 1306   BILITOT 0.80 07/14/2016 1256   BILITOT 0.83 03/24/2016 1306         Impression and Plan: [name] is 71 year old female with polycythemia vera. She is  very hyperviscous.We will definitely phlebotomize Sarah.   Sarah main problem is Sarah medications. Apparently, there are some issues with Sarah medicines. I, personally, I have no problems with what she takes. I think that she has been very judicious with Sarah medications. I realize that she is on quite a few. However, Sarah quality of life is clearly much better. She is much more functional. She is able to rest at night. She is able to take care of [name] who is somewhat disabled.   I told Sarah that I would be more than willing to take over prescribing Sarah controlled substances. I have known  Sarah for 18 years. I have never had an issue with Sarah with respect to Sarah medications. Again, she is quite conscientious about taking them and realizes that these are highly controlled substances that can be abused and have been abused by many people. We are in a situation in this country where the government is taking a very hard line on opioids, as they should be for those patients who are abusing them for recreational purposes.   I put Sarah mind to ease. She was just incredibly anxious over what would happen to Sarah if Sarah medications were not given to Sarah. Again, since I have known Sarah well for 18 years, or maybe even longer, I know Sarah very well and absolutely trust Sarah using Sarah medications. She clearly understands the consequences of misuse of these medicines.    I spent about 45 minutes with she and [name] today discussing this whole issue. Again she is at peace and is finally able to have some relief emotionally.   We will continue to check Sarah blood counts every 2 weeks. I will see Sarah back in 2 months.   Volanda Napoleon, MD 4/18/20184:06 PM

## 2016-07-15 ENCOUNTER — Ambulatory Visit (HOSPITAL_BASED_OUTPATIENT_CLINIC_OR_DEPARTMENT_OTHER): Payer: Medicare Other

## 2016-07-15 VITALS — BP 112/47 | HR 72 | Resp 18

## 2016-07-15 DIAGNOSIS — D45 Polycythemia vera: Secondary | ICD-10-CM | POA: Diagnosis present

## 2016-07-15 LAB — IRON AND TIBC
%SAT: 4 % — ABNORMAL LOW (ref 21–57)
IRON: 22 ug/dL — AB (ref 41–142)
TIBC: 524 ug/dL — ABNORMAL HIGH (ref 236–444)
UIBC: 501 ug/dL — ABNORMAL HIGH (ref 120–384)

## 2016-07-15 LAB — FERRITIN: Ferritin: 4 ng/ml — ABNORMAL LOW (ref 9–269)

## 2016-07-15 MED FILL — OxyCONTIN 20 MG T12A: 20 | 30 days supply | Qty: 90 | Fill #0

## 2016-07-15 NOTE — Progress Notes (Signed)
Patient came for therapeutic phlebotomy and 560g removed via 16G to L AC and began at 1422 and ended at 45 . Patient tolerated well and diet and nutrition offered. Bandage applied.

## 2016-07-15 NOTE — Patient Instructions (Signed)
     Therapeutic Phlebotomy, Care After Refer to this sheet in the next few weeks. These instructions provide you with information about caring for yourself after your procedure. Your health care provider may also give you more specific instructions. Your treatment has been planned according to current medical practices, but problems sometimes occur. Call your health care provider if you have any problems or questions after your procedure. What can I expect after the procedure? After the procedure, it is common to have:  Light-headedness or dizziness. You may feel faint.  Nausea.  Tiredness. Follow these instructions at home: Activity  Return to your normal activities as directed by your health care provider. Most people can go back to their normal activities right away.  Avoid strenuous physical activity and heavy lifting or pulling for about 5 hours after the procedure. Do not lift anything that is heavier than 10 lb (4.5 kg).  Athletes should avoid strenuous exercise for at least 12 hours.  Change positions slowly for the remainder of the day. This will help to prevent light-headedness or fainting.  If you feel light-headed, lie down until the feeling goes away. Eating and drinking  Be sure to eat well-balanced meals for the next 24 hours.  Drink enough fluid to keep your urine clear or pale yellow.  Avoid drinking alcohol on the day that you had the procedure. Care of the Needle Insertion Site  Keep your bandage dry. You can remove the bandage after about 5 hours or as directed by your health care provider.  If you have bleeding from the needle insertion site, elevate your arm and press firmly on the site until the bleeding stops.  If you have bruising at the site, apply ice to the area:  Put ice in a plastic bag.  Place a towel between your skin and the bag.  Leave the ice on for 20 minutes, 2-3 times a day for the first 24 hours.  If the swelling does not go away  after 24 hours, apply a warm, moist washcloth to the area for 20 minutes, 2-3 times a day. General instructions  Avoid smoking for at least 30 minutes after the procedure.  Keep all follow-up visits as directed by your health care provider. It is important to continue with further therapeutic phlebotomy treatments as directed. Contact a health care provider if:  You have redness, swelling, or pain at the needle insertion site.  You have fluid, blood, or pus coming from the needle insertion site.  You feel light-headed, dizzy, or nauseated, and the feeling does not go away.  You notice new bruising at the needle insertion site.  You feel weaker than normal.  You have a fever or chills. Get help right away if:  You have severe nausea or vomiting.  You have chest pain.  You have trouble breathing. This information is not intended to replace advice given to you by your health care provider. Make sure you discuss any questions you have with your health care provider. Document Released: 08/17/2010 Document Revised: 11/15/2015 Document Reviewed: 03/11/2014 Elsevier Interactive Patient Education  2017 Elsevier Inc.  

## 2016-07-22 MED FILL — TEMAZEPAM 30 MG CAPSULE: 30 | 30 days supply | Qty: 30 | Fill #0

## 2016-07-28 ENCOUNTER — Telehealth: Payer: Self-pay | Admitting: *Deleted

## 2016-07-28 ENCOUNTER — Other Ambulatory Visit: Payer: Medicare Other

## 2016-07-28 DIAGNOSIS — D45 Polycythemia vera: Secondary | ICD-10-CM

## 2016-07-28 LAB — CBC WITH DIFFERENTIAL (CANCER CENTER ONLY)
BASO#: 0 10*3/uL (ref 0.0–0.2)
BASO%: 0.2 % (ref 0.0–2.0)
EOS%: 13.3 % — AB (ref 0.0–7.0)
Eosinophils Absolute: 0.6 10*3/uL — ABNORMAL HIGH (ref 0.0–0.5)
HCT: 21.7 % — ABNORMAL LOW (ref 34.8–46.6)
HGB: 5.2 g/dL — CL (ref 11.6–15.9)
LYMPH#: 0.7 10*3/uL — AB (ref 0.9–3.3)
LYMPH%: 14.7 % (ref 14.0–48.0)
MCH: 17 pg — ABNORMAL LOW (ref 26.0–34.0)
MCHC: 24 g/dL — ABNORMAL LOW (ref 32.0–36.0)
MCV: 71 fL — ABNORMAL LOW (ref 81–101)
MONO#: 0.6 10*3/uL (ref 0.1–0.9)
MONO%: 12.7 % (ref 0.0–13.0)
NEUT%: 59.1 % (ref 39.6–80.0)
NEUTROS ABS: 2.7 10*3/uL (ref 1.5–6.5)
Platelets: 139 10*3/uL — ABNORMAL LOW (ref 145–400)
RBC: 3.06 10*6/uL — ABNORMAL LOW (ref 3.70–5.32)
RDW: 20.2 % — AB (ref 11.1–15.7)
WBC: 4.5 10*3/uL (ref 3.9–10.0)

## 2016-07-28 NOTE — Telephone Encounter (Signed)
Critical Value HGB 5.2 Dr Marin Olp notified. No orders at this time

## 2016-08-09 ENCOUNTER — Other Ambulatory Visit: Payer: Self-pay | Admitting: *Deleted

## 2016-08-09 DIAGNOSIS — D45 Polycythemia vera: Secondary | ICD-10-CM

## 2016-08-09 DIAGNOSIS — I358 Other nonrheumatic aortic valve disorders: Secondary | ICD-10-CM

## 2016-08-09 DIAGNOSIS — M797 Fibromyalgia: Secondary | ICD-10-CM

## 2016-08-09 DIAGNOSIS — G4700 Insomnia, unspecified: Secondary | ICD-10-CM

## 2016-08-09 DIAGNOSIS — M62838 Other muscle spasm: Secondary | ICD-10-CM

## 2016-08-09 MED ORDER — OXYCODONE HCL ER 20 MG PO T12A: 20.0000 mg | EXTENDED_RELEASE_TABLET | Freq: Three times a day (TID) | ORAL | 0 refills | Status: DC

## 2016-08-09 MED ORDER — OXYCODONE HCL 15 MG PO TABS: 15.0000 mg | ORAL_TABLET | Freq: Four times a day (QID) | ORAL | 0 refills | Status: DC | PRN

## 2016-08-09 MED ORDER — OXYCODONE HCL ER 10 MG PO T12A
10.0000 mg | EXTENDED_RELEASE_TABLET | Freq: Two times a day (BID) | ORAL | 0 refills | Status: DC
Start: 2016-08-09 — End: 2016-09-06

## 2016-08-11 ENCOUNTER — Other Ambulatory Visit: Payer: Self-pay | Admitting: Hematology & Oncology

## 2016-08-11 ENCOUNTER — Other Ambulatory Visit (HOSPITAL_BASED_OUTPATIENT_CLINIC_OR_DEPARTMENT_OTHER): Payer: Medicare Other

## 2016-08-11 DIAGNOSIS — G4700 Insomnia, unspecified: Secondary | ICD-10-CM

## 2016-08-11 DIAGNOSIS — M797 Fibromyalgia: Secondary | ICD-10-CM

## 2016-08-11 DIAGNOSIS — M62838 Other muscle spasm: Secondary | ICD-10-CM

## 2016-08-11 DIAGNOSIS — Z1231 Encounter for screening mammogram for malignant neoplasm of breast: Secondary | ICD-10-CM

## 2016-08-11 DIAGNOSIS — D45 Polycythemia vera: Secondary | ICD-10-CM

## 2016-08-11 DIAGNOSIS — I358 Other nonrheumatic aortic valve disorders: Secondary | ICD-10-CM

## 2016-08-11 LAB — CBC WITH DIFFERENTIAL (CANCER CENTER ONLY)
BASO#: 0 10*3/uL (ref 0.0–0.2)
BASO%: 0.4 % (ref 0.0–2.0)
EOS%: 10.8 % — ABNORMAL HIGH (ref 0.0–7.0)
Eosinophils Absolute: 0.6 10*3/uL — ABNORMAL HIGH (ref 0.0–0.5)
HCT: 21.3 % — ABNORMAL LOW (ref 34.8–46.6)
HGB: 5.1 g/dL — CL (ref 11.6–15.9)
LYMPH#: 0.8 10*3/uL — ABNORMAL LOW (ref 0.9–3.3)
LYMPH%: 13.3 % — AB (ref 14.0–48.0)
MCH: 16.9 pg — ABNORMAL LOW (ref 26.0–34.0)
MCHC: 23.9 g/dL — AB (ref 32.0–36.0)
MCV: 71 fL — ABNORMAL LOW (ref 81–101)
MONO#: 0.8 10*3/uL (ref 0.1–0.9)
MONO%: 13.3 % — ABNORMAL HIGH (ref 0.0–13.0)
NEUT#: 3.5 10*3/uL (ref 1.5–6.5)
NEUT%: 62.2 % (ref 39.6–80.0)
Platelets: 144 10*3/uL — ABNORMAL LOW (ref 145–400)
RBC: 3.01 10*6/uL — ABNORMAL LOW (ref 3.70–5.32)
RDW: 20.9 % — AB (ref 11.1–15.7)
WBC: 5.7 10*3/uL (ref 3.9–10.0)

## 2016-08-11 MED FILL — ALPRAZolam 0.25 MG TABS: 0.25 | 30 days supply | Qty: 60 | Fill #0

## 2016-08-12 ENCOUNTER — Ambulatory Visit (HOSPITAL_BASED_OUTPATIENT_CLINIC_OR_DEPARTMENT_OTHER)
Admission: RE | Admit: 2016-08-12 | Discharge: 2016-08-12 | Disposition: A | Discharge status: Home / Self Care | Payer: Medicare Other | Source: Ambulatory Visit | Attending: Hematology & Oncology | Admitting: Hematology & Oncology

## 2016-08-12 DIAGNOSIS — Z1231 Encounter for screening mammogram for malignant neoplasm of breast: Secondary | ICD-10-CM

## 2016-08-12 MED FILL — OxyCONTIN 10 MG T12A: 10 | 30 days supply | Qty: 60 | Fill #0

## 2016-08-13 MED FILL — CYCLOBENZAPRINE 10 MG TAB: 10 | 15 days supply | Qty: 60 | Fill #0

## 2016-08-19 MED FILL — TEMAZEPAM 30 MG CAPSULE: 30 | 30 days supply | Qty: 30 | Fill #0

## 2016-08-25 ENCOUNTER — Telehealth: Payer: Self-pay | Admitting: *Deleted

## 2016-08-25 ENCOUNTER — Other Ambulatory Visit (HOSPITAL_BASED_OUTPATIENT_CLINIC_OR_DEPARTMENT_OTHER): Payer: Medicare Other

## 2016-08-25 DIAGNOSIS — D45 Polycythemia vera: Secondary | ICD-10-CM | POA: Diagnosis present

## 2016-08-25 LAB — CBC WITH DIFFERENTIAL (CANCER CENTER ONLY)
BASO#: 0 10*3/uL (ref 0.0–0.2)
BASO%: 0.2 % (ref 0.0–2.0)
EOS%: 4.3 % (ref 0.0–7.0)
Eosinophils Absolute: 0.2 10*3/uL (ref 0.0–0.5)
HEMATOCRIT: 22.8 % — AB (ref 34.8–46.6)
HGB: 5.5 g/dL — CL (ref 11.6–15.9)
LYMPH#: 0.8 10*3/uL — AB (ref 0.9–3.3)
LYMPH%: 17.1 % (ref 14.0–48.0)
MCH: 16.8 pg — ABNORMAL LOW (ref 26.0–34.0)
MCHC: 24.1 g/dL — ABNORMAL LOW (ref 32.0–36.0)
MCV: 70 fL — AB (ref 81–101)
MONO#: 0.7 10*3/uL (ref 0.1–0.9)
MONO%: 15.3 % — ABNORMAL HIGH (ref 0.0–13.0)
NEUT%: 63.1 % (ref 39.6–80.0)
NEUTROS ABS: 2.9 10*3/uL (ref 1.5–6.5)
Platelets: 128 10*3/uL — ABNORMAL LOW (ref 145–400)
RBC: 3.27 10*6/uL — AB (ref 3.70–5.32)
RDW: 21.1 % — AB (ref 11.1–15.7)
WBC: 4.6 10*3/uL (ref 3.9–10.0)

## 2016-08-25 NOTE — Telephone Encounter (Signed)
Critical Value Hgb 5.5 Dr Ennever notified. No orders at this time 

## 2016-09-06 ENCOUNTER — Other Ambulatory Visit: Payer: Self-pay | Admitting: *Deleted

## 2016-09-06 DIAGNOSIS — G4700 Insomnia, unspecified: Secondary | ICD-10-CM

## 2016-09-06 DIAGNOSIS — I358 Other nonrheumatic aortic valve disorders: Secondary | ICD-10-CM

## 2016-09-06 DIAGNOSIS — M797 Fibromyalgia: Secondary | ICD-10-CM

## 2016-09-06 DIAGNOSIS — D45 Polycythemia vera: Secondary | ICD-10-CM

## 2016-09-06 DIAGNOSIS — M62838 Other muscle spasm: Secondary | ICD-10-CM

## 2016-09-06 MED ORDER — OXYCODONE HCL ER 20 MG PO T12A: 20.0000 mg | EXTENDED_RELEASE_TABLET | Freq: Three times a day (TID) | ORAL | 0 refills | Status: DC

## 2016-09-06 MED ORDER — OXYCODONE HCL 15 MG PO TABS: 15.0000 mg | ORAL_TABLET | Freq: Four times a day (QID) | ORAL | 0 refills | Status: DC | PRN

## 2016-09-06 MED ORDER — OXYCODONE HCL ER 10 MG PO T12A: 10.0000 mg | EXTENDED_RELEASE_TABLET | Freq: Two times a day (BID) | ORAL | 0 refills | Status: DC

## 2016-09-08 ENCOUNTER — Ambulatory Visit (HOSPITAL_BASED_OUTPATIENT_CLINIC_OR_DEPARTMENT_OTHER): Payer: Medicare Other | Admitting: Hematology & Oncology

## 2016-09-08 ENCOUNTER — Other Ambulatory Visit: Payer: Medicare Other

## 2016-09-08 ENCOUNTER — Other Ambulatory Visit (HOSPITAL_BASED_OUTPATIENT_CLINIC_OR_DEPARTMENT_OTHER): Payer: Medicare Other

## 2016-09-08 ENCOUNTER — Telehealth: Payer: Self-pay | Admitting: *Deleted

## 2016-09-08 DIAGNOSIS — Z8673 Personal history of transient ischemic attack (TIA), and cerebral infarction without residual deficits: Secondary | ICD-10-CM

## 2016-09-08 DIAGNOSIS — M62838 Other muscle spasm: Secondary | ICD-10-CM

## 2016-09-08 DIAGNOSIS — G4700 Insomnia, unspecified: Secondary | ICD-10-CM

## 2016-09-08 DIAGNOSIS — D45 Polycythemia vera: Secondary | ICD-10-CM | POA: Diagnosis not present

## 2016-09-08 DIAGNOSIS — M797 Fibromyalgia: Secondary | ICD-10-CM

## 2016-09-08 DIAGNOSIS — I358 Other nonrheumatic aortic valve disorders: Secondary | ICD-10-CM

## 2016-09-08 LAB — CMP (CANCER CENTER ONLY)
ALK PHOS: 104 U/L — AB (ref 26–84)
ALT: 19 U/L (ref 10–47)
AST: 24 U/L (ref 11–38)
Albumin: 3.5 g/dL (ref 3.3–5.5)
BUN: 9 mg/dL (ref 7–22)
CO2: 30 mEq/L (ref 18–33)
CREATININE: 0.9 mg/dL (ref 0.6–1.2)
Calcium: 8.9 mg/dL (ref 8.0–10.3)
Chloride: 102 mEq/L (ref 98–108)
GLUCOSE: 134 mg/dL — AB (ref 73–118)
Potassium: 3.4 mEq/L (ref 3.3–4.7)
SODIUM: 140 meq/L (ref 128–145)
TOTAL PROTEIN: 6.7 g/dL (ref 6.4–8.1)
Total Bilirubin: 0.9 mg/dl (ref 0.20–1.60)

## 2016-09-08 LAB — CBC WITH DIFFERENTIAL (CANCER CENTER ONLY)
BASO#: 0 10*3/uL (ref 0.0–0.2)
BASO%: 0.2 % (ref 0.0–2.0)
EOS%: 7.3 % — AB (ref 0.0–7.0)
Eosinophils Absolute: 0.4 10*3/uL (ref 0.0–0.5)
HCT: 24.6 % — ABNORMAL LOW (ref 34.8–46.6)
HGB: 6 g/dL — CL (ref 11.6–15.9)
LYMPH#: 1 10*3/uL (ref 0.9–3.3)
LYMPH%: 17.3 % (ref 14.0–48.0)
MCH: 17 pg — ABNORMAL LOW (ref 26.0–34.0)
MCHC: 24.4 g/dL — ABNORMAL LOW (ref 32.0–36.0)
MCV: 70 fL — ABNORMAL LOW (ref 81–101)
MONO#: 0.6 10*3/uL (ref 0.1–0.9)
MONO%: 10.4 % (ref 0.0–13.0)
NEUT#: 3.6 10*3/uL (ref 1.5–6.5)
NEUT%: 64.8 % (ref 39.6–80.0)
RBC: 3.52 10*6/uL — AB (ref 3.70–5.32)
RDW: 21.4 % — ABNORMAL HIGH (ref 11.1–15.7)
WBC: 5.5 10*3/uL (ref 3.9–10.0)

## 2016-09-08 LAB — TECHNOLOGIST REVIEW CHCC SATELLITE

## 2016-09-08 MED ORDER — ALPRAZOLAM 0.5 MG PO TABS: 0.5000 mg | ORAL_TABLET | Freq: Two times a day (BID) | ORAL | 0 refills | Status: DC

## 2016-09-08 NOTE — Progress Notes (Signed)
Hematology and Oncology Follow Up Visit  RHEDA KASSAB [id number] Aug 10, 1945 71 y.o. 09/08/2016   Principle Diagnosis:  Polycythemia vera -- hyperviscosity variant. 2. 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  Current Therapy:    Phlebotomy to maintain hemoglobin below 6.0     Interim History:  Ms.  Krauss is back for followup. She is not feeling all that well. She feels that she needs be phlebotomized.  She has not been sleeping all that well. She really gets anxious when she does not sleep. She gets quite emotional when she does not sleep.  She is definitely iron deficient. We have, as this by phlebotomizing her.   She has had no problems with nausea or vomiting. She's had no change in bowel or bladder habits. She's had no chest discomfort. She's had no shortness of breath. Thankfully, she's had no issues with influenza.   She has occasional leg swelling. She has occasional palpitations.    Overall, her performance status is ECOG 1.   Medications:Allergies:  Allergies  Allergen Reactions  . Ethrane [Enflurane] Nausea And Vomiting  . Codeine Nausea Only  . Synthroid [Levothyroxine Sodium] Other (See Comments)    Not effective. Causes excessive sleepiness. Takes armour thyroid  . Calcium Channel Blockers Swelling, Palpitations and Hypertension  . Penicillins Rash    Past Medical History, Surgical history, Social history, and Family History were reviewed and updated.  Review of Systems: As above  Physical Exam:  weight is 162 lb (73.5 kg). Her oral temperature is 98.4 F (36.9 C). Her blood pressure is 138/50 (abnormal) and her pulse is 79. Her respiration is 20 and oxygen saturation is 100%.   Well-developed and well-nourished white female in no obvious distress. Head and neck exam shows pale conjunctiva. There is no oral lesions. There is no adenopathy in the neck. Lungs are clear bilaterally. Cardiac exam regular rate and rhythm  with a 1/6 systolic ejection murmur. Abdomen is soft. Has good bowel sounds. There is no palpable liver or spleen tip appreciative well-healed laparotomy scars. Back exam no tenderness over the spine ribs or hips. Extremities shows no clubbing cyanosis or edema. Neurological exam shows no focal neurological deficits. Skin exam no rashes, ecchymoses or petechia.  Lab Results  Component Value Date   WBC 5.5 09/08/2016   HGB 6.0 (LL) 09/08/2016   HCT 24.6 (L) 09/08/2016   MCV 70 (L) 09/08/2016   PLT 167 Large & giant platelets 09/08/2016     Chemistry      Component Value Date/Time   NA 140 09/08/2016 1336   NA 138 03/24/2016 1306   K 3.4 09/08/2016 1336   K 3.3 (L) 03/24/2016 1306   CL 102 09/08/2016 1336   CO2 30 09/08/2016 1336   CO2 31 (H) 03/24/2016 1306   BUN 9 09/08/2016 1336   BUN 8.2 03/24/2016 1306   CREATININE 0.9 09/08/2016 1336   CREATININE 0.8 03/24/2016 1306      Component Value Date/Time   CALCIUM 8.9 09/08/2016 1336   CALCIUM 9.2 03/24/2016 1306   ALKPHOS 104 (H) 09/08/2016 1336   ALKPHOS 114 03/24/2016 1306   AST 24 09/08/2016 1336   AST 15 03/24/2016 1306   ALT 19 09/08/2016 1336   ALT 7 03/24/2016 1306   BILITOT 0.90 09/08/2016 1336   BILITOT 0.83 03/24/2016 1306         Impression and Plan: Ms. Decelle is 71 year old female with polycythemia vera. She is very hyperviscous.We  will definitely phlebotomize her.   We will continue to check her blood counts every 2 weeks. I will see her back in 2 months.   Volanda Napoleon, MD 6/13/20183:35 PM

## 2016-09-08 NOTE — Telephone Encounter (Signed)
Critical Value Hgb 6.0 Dr Ennever notified. No orders at this time 

## 2016-09-10 ENCOUNTER — Ambulatory Visit (HOSPITAL_BASED_OUTPATIENT_CLINIC_OR_DEPARTMENT_OTHER): Payer: Medicare Other

## 2016-09-10 ENCOUNTER — Other Ambulatory Visit: Payer: Self-pay

## 2016-09-10 VITALS — BP 96/56 | HR 60

## 2016-09-10 DIAGNOSIS — D45 Polycythemia vera: Secondary | ICD-10-CM

## 2016-09-10 MED ORDER — METOCLOPRAMIDE HCL 10 MG PO TABS: 10.0000 mg | ORAL_TABLET | Freq: Four times a day (QID) | ORAL | 3 refills | Status: DC | PRN

## 2016-09-10 MED FILL — OxyCONTIN 10 MG T12A: 10 | 30 days supply | Qty: 60 | Fill #0

## 2016-09-10 NOTE — Patient Instructions (Signed)
Therapeutic Phlebotomy Therapeutic phlebotomy is the controlled removal of blood from a person's body for the purpose of treating a medical condition. The procedure is similar to donating blood. Usually, about a pint (470 mL, or 0.47L) of blood is removed. The average adult has 9-12 pints (4.3-5.7 L) of blood. Therapeutic phlebotomy may be used to treat the following medical conditions:  Hemochromatosis. This is a condition in which the blood contains too much iron.  Polycythemia vera. This is a condition in which the blood contains too many red blood cells.  Porphyria cutanea tarda. This is a disease in which an important part of hemoglobin is not made properly. It results in the buildup of abnormal amounts of porphyrins in the body.  Sickle cell disease. This is a condition in which the red blood cells form an abnormal crescent shape rather than a round shape.  Tell a health care provider about:  Any allergies you have.  All medicines you are taking, including vitamins, herbs, eye drops, creams, and over-the-counter medicines.  Any problems you or family members have had with anesthetic medicines.  Any blood disorders you have.  Any surgeries you have had.  Any medical conditions you have. What are the risks? Generally, this is a safe procedure. However, problems may occur, including:  Nausea or light-headedness.  Low blood pressure.  Soreness, bleeding, swelling, or bruising at the needle insertion site.  Infection.  What happens before the procedure?  Follow instructions from your health care provider about eating or drinking restrictions.  Ask your health care provider about changing or stopping your regular medicines. This is especially important if you are taking diabetes medicines or blood thinners.  Wear clothing with sleeves that can be raised above the elbow.  Plan to have someone take you home after the procedure.  You may have a blood sample taken. What  happens during the procedure?  A needle will be inserted into one of your veins.  Tubing and a collection bag will be attached to that needle.  Blood will flow through the needle and tubing into the collection bag.  You may be asked to open and close your hand slowly and continually during the entire collection.  After the specified amount of blood has been removed from your body, the collection bag and tubing will be clamped.  The needle will be removed from your vein.  Pressure will be held on the site of the needle insertion to stop the bleeding.  A bandage (dressing) will be placed over the needle insertion site. The procedure may vary among health care providers and hospitals. What happens after the procedure?  Your recovery will be assessed and monitored.  You can return to your normal activities as directed by your health care provider. This information is not intended to replace advice given to you by your health care provider. Make sure you discuss any questions you have with your health care provider. Document Released: 08/17/2010 Document Revised: 11/15/2015 Document Reviewed: 03/11/2014 Elsevier Interactive Patient Education  2018 [employer]  

## 2016-09-10 NOTE — Progress Notes (Signed)
[name] presents today for phlebotomy per MD orders. Phlebotomy procedure started at 1110 and ended at 1120 500 grams removed. Patient observed for 30 minutes after procedure without any incident. Patient tolerated procedure well. IV needle removed intact.

## 2016-09-13 ENCOUNTER — Other Ambulatory Visit: Payer: Self-pay | Admitting: *Deleted

## 2016-09-13 DIAGNOSIS — M62838 Other muscle spasm: Secondary | ICD-10-CM

## 2016-09-13 DIAGNOSIS — M797 Fibromyalgia: Secondary | ICD-10-CM

## 2016-09-13 DIAGNOSIS — D45 Polycythemia vera: Secondary | ICD-10-CM

## 2016-09-13 DIAGNOSIS — G4700 Insomnia, unspecified: Secondary | ICD-10-CM

## 2016-09-13 DIAGNOSIS — I358 Other nonrheumatic aortic valve disorders: Secondary | ICD-10-CM

## 2016-09-13 MED ORDER — TEMAZEPAM 30 MG PO CAPS: ORAL_CAPSULE | ORAL | 0 refills | Status: DC

## 2016-09-13 MED FILL — TEMAZEPAM 30 MG CAPSULE: 30 | 30 days supply | Qty: 30 | Fill #0

## 2016-09-14 ENCOUNTER — Encounter: Payer: Self-pay | Admitting: Family Medicine

## 2016-09-14 ENCOUNTER — Ambulatory Visit (INDEPENDENT_AMBULATORY_CARE_PROVIDER_SITE_OTHER): Payer: Medicare Other | Admitting: Family Medicine

## 2016-09-14 ENCOUNTER — Other Ambulatory Visit: Payer: Self-pay | Admitting: Hematology & Oncology

## 2016-09-14 VITALS — BP 98/56 | HR 72 | Temp 98.4°F | Resp 16 | Ht 63.0 in | Wt 164.4 lb

## 2016-09-14 DIAGNOSIS — R600 Localized edema: Secondary | ICD-10-CM

## 2016-09-14 DIAGNOSIS — E049 Nontoxic goiter, unspecified: Secondary | ICD-10-CM

## 2016-09-14 DIAGNOSIS — E876 Hypokalemia: Secondary | ICD-10-CM | POA: Diagnosis not present

## 2016-09-14 DIAGNOSIS — N390 Urinary tract infection, site not specified: Secondary | ICD-10-CM

## 2016-09-14 MED ORDER — POTASSIUM CHLORIDE ER 10 MEQ PO TBCR: EXTENDED_RELEASE_TABLET | ORAL | 2 refills | Status: DC

## 2016-09-14 NOTE — Patient Instructions (Signed)

## 2016-09-14 NOTE — Progress Notes (Signed)
Patient ID: DEIRDRA HEUMANN, female    DOB: Apr 17, 1945  Age: 71 y.o. MRN: [id number]    Subjective:  Subjective  HPI KIVA NORLAND presents for   Review of Systems  Constitutional: Positive for fatigue. Negative for appetite change, diaphoresis and unexpected weight change.  Eyes: Negative for pain, redness and visual disturbance.  Respiratory: Negative for cough, chest tightness, shortness of breath and wheezing.   Cardiovascular: Negative for chest pain, palpitations and leg swelling.  Endocrine: Negative for cold intolerance, heat intolerance, polydipsia, polyphagia and polyuria.  Genitourinary: Negative for difficulty urinating, dysuria and frequency.  Neurological: Negative for dizziness, light-headedness, numbness and headaches.    History Past Medical History:  Diagnosis Date  . Acute cholecystitis 10/07/2012   s/p pec drain due to recent CVA, pending chole 11/2012  . Anemia    "due to polycythemia vera" (12/28/2012)  . Anxiety   . Aortic valve sclerosis    by echocardiogram 12/04/2009  . Asthma    "wheeze occasionally; I don't have asthma" (12/28/2012)  . Chronic back pain   . Chronic constipation    takes Mineral Oil,Juice,Enema(prn),and Miralax(Prn) and Cascara nightly  . Complex partial seizure disorder ([place]) 10/15/2012  . CVA (cerebral infarction) 10-05-12   rHP, improved, complicated by sz event x 1  . Difficult intubation    grade 3 airway  . Exertional dyspnea   . Fibromyalgia    chronic pain syndrome  . GERD (gastroesophageal reflux disease)   . Heart murmur    "flow mumur" (12/28/2012)  . History of blood transfusion 09/2012; 12/28/2012  . Hyperlipidemia    "not since carotid OR" (12/28/2012)  . Hypertension    "not since carotid OR" (12/28/2012)  . Hypothyroidism   . Insomnia    takes restoril nightly and Xanax  . 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 Occlusion and stenosis of carotid artery without mention of cerebral infarction 10/26/2012  . PAC  (premature atrial contraction)   . 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 PONV (postoperative nausea and vomiting)    "I vomit for 5 days straight w/certain RX w/thanes" (12/28/2012)  . Postmenopausal state    on hormone replacement therapy  . Seasonal allergies    takes Allegra in am and Benadryl at night  . Seizures ([place])    only seizure was 10/14/12 "after carotid endarterectomy";takes Keppra daily  . Stroke [facility]) 09/2012   "after they put drain in my gallbladder"; residual is "haven't felt well enough to tell since stroke to tell; weak already; weaker when I'm tired" (12/28/2012)    She has a past surgical history that includes Endarterectomy (Left, 10/10/2012); Carotid endarterectomy (Left, 10-10-12); Abdominal hysterectomy (1996); Tonsillectomy; Nasal septum surgery; Colonoscopy; insertion of drain (10/02/12); PICC line placed; Tubal ligation (~ 1972); Esophagogastroduodenoscopy (N/A, 01/02/2013); Cholecystectomy (N/A, 11/29/2012); Cholecystectomy (N/A, 11/29/2012); and Colonoscopy (N/A, 05/14/2013).   Her family history includes Coronary artery disease in her mother; Heart attack in her father and mother; Heart disease in her father and mother; Hyperlipidemia in her sister; Hypertension in her sister.She reports that she has never smoked. She has never used smokeless tobacco. She reports that she does not drink alcohol or use drugs.  Current Outpatient Prescriptions on File Prior to Visit  Medication Sig Dispense Refill  . ALPRAZolam (XANAX) 0.5 MG tablet Take 1 tablet (0.5 mg total) by mouth 2 (two) times daily. 60 tablet 0  . ARMOUR THYROID 120 MG tablet TAKE 1 TABLET (120 MG TOTAL)  BY MOUTH DAILY BEFORE BREAKFAST. 90 tablet 2  . ARMOUR THYROID 30 MG tablet Take 1 tablet (30 mg total) by mouth daily. 90 tablet 0  . aspirin 325 MG tablet Take 325 mg by mouth daily after breakfast. "to thin  Blood"    . bisoprolol (ZEBETA) 10 MG tablet Take 1 tablet (10 mg total) by mouth daily. 90  tablet 3  . Cascara [place] 450 MG CAPS Take 900 mg by mouth at bedtime.    . clindamycin (CLEOCIN) 300 MG capsule Take 2 capsules by mouth as directed.    . clopidogrel (PLAVIX) 75 MG tablet TAKE 1 TABLET (75 MG TOTAL) BY MOUTH DAILY. 30 tablet 3  . cyclobenzaprine (FLEXERIL) 10 MG tablet Take 0.5-1 tablets (5-10 mg total) by mouth every 6 (six) hours as needed for muscle spasms. 60 tablet 5  . diphenhydrAMINE (BENADRYL) 25 MG tablet Take 50 mg by mouth every 8 (eight) hours as needed. Patient may take 50 mg three times a day as needed for allergies    . docusate sodium (COLACE) 100 MG capsule Take 100 mg by mouth 3 (three) times daily with meals.     Marland Kitchen escitalopram (LEXAPRO) 10 MG tablet Take 1 tablet (10 mg total) by mouth daily. 30 tablet 2  . famotidine (PEPCID) 20 MG tablet TAKE 1 TABLET BY MOUTH TWICE A DAY BEFORE A MEAL *4/29* 180 tablet 3  . famotidine (PEPCID) 20 MG tablet TAKE 1 TABLET BY MOUTH TWICE A DAY BEFORE A MEAL *4/29* 180 tablet 3  . fesoterodine (TOVIAZ) 4 MG TB24 tablet Take 1 tablet (4 mg total) by mouth daily. 30 tablet 2  . fexofenadine (ALLEGRA) 180 MG tablet Take 180 mg by mouth daily after breakfast.     . furosemide (LASIX) 40 MG tablet Take 1 tablet (40 mg total) by mouth 2 (two) times daily. 180 tablet 3  . Garlic Oil 6301 MG CAPS Take 1,000 mg by mouth daily after breakfast. Reported on 06/03/2015    . guaiFENesin (MUCINEX) 600 MG 12 hr tablet Take 400 mg by mouth daily.     . hydrochlorothiazide (MICROZIDE) 12.5 MG capsule Take 1 capsule (12.5 mg total) by mouth daily. 90 capsule 3  . Ipratropium-Albuterol (COMBIVENT RESPIMAT) 20-100 MCG/ACT AERS respimat Inhale 2 puffs into the lungs 4 (four) times daily.    Marland Kitchen levETIRAcetam (KEPPRA) 250 MG tablet Take 1 tablet (250 mg total) by mouth 2 (two) times daily. 180 tablet 3  . lidocaine-prilocaine (EMLA) cream Apply 1 application topically as needed (to numb the phlebotomy site). 30 g 6  . lisinopril (PRINIVIL,ZESTRIL) 10  MG tablet Take 10 mg by mouth 2 (two) times daily as needed. Reported on 06/03/2015    . magnesium oxide (MAG-OX) 400 MG tablet Take 1,200 mg by mouth daily.    . metoCLOPramide (REGLAN) 10 MG tablet Take 1 tablet (10 mg total) by mouth every 6 (six) hours as needed for nausea. 90 tablet 3  . mineral oil liquid Take 60 mLs by mouth at bedtime. With Juice    . Misc Natural Products (TART CHERRY ADVANCED PO) Take 1,200 mg by mouth 2 (two) times daily. Reported on 06/03/2015    . nitroGLYCERIN (NITROSTAT) 0.4 MG SL tablet Place 0.4 mg under the tongue every 5 (five) minutes as needed for chest pain.    . NONFORMULARY OR COMPOUNDED ITEM Cmp, lipid, cbcd , , tsh.magnesium -- dx hypothyroidism, hx cva, hypokalemia, 1 each 0  . ondansetron (ZOFRAN) 4 MG tablet  Take 4 mg by mouth every 8 (eight) hours as needed for nausea or vomiting.    Marland Kitchen oxyCODONE (OXYCONTIN) 10 mg 12 hr tablet Take 1 tablet (10 mg total) by mouth 2 (two) times daily. Take one tablet by mouth at 8am and one tablet by mouth at 4pm 60 tablet 0  . oxyCODONE (OXYCONTIN) 20 mg 12 hr tablet Take 1 tablet (20 mg total) by mouth 3 (three) times daily. Take 1 tablet by mouth at 12noon, 8pm and 4am 90 tablet 0  . oxyCODONE (ROXICODONE) 15 MG immediate release tablet Take 1 tablet (15 mg total) by mouth every 6 (six) hours as needed for pain. 90 tablet 0  . potassium chloride (MICRO-K) 10 MEQ CR capsule TAKE 1 TABLET BY MOUTH 9 TIMES A DAY FOR 2 WEEKS AND THEN 1 TABLET BY MOUTH EIGHT TIMES DAILY 254 capsule 4  . promethazine (PHENERGAN) 25 MG tablet Take 25 mg by mouth every 6 (six) hours as needed for nausea.    . Sennosides (SENNA LAX PO) Take 1 tablet by mouth 3 (three) times daily after meals.     . temazepam (RESTORIL) 30 MG capsule TAKE 1 CAPSULE BY MOUTH AT BEDTIME AS NEEDED FOR SLEEP 30 capsule 0  . thyroid (ARMOUR) 120 MG tablet Take 150 mg by mouth daily before breakfast.    . thyroid (ARMOUR) 120 MG tablet Take 120 mg by mouth daily before  breakfast.    . triamcinolone cream (KENALOG) 0.1 % Apply 1 application topically 2 (two) times daily as needed (to skin).    . Vitamin D, Cholecalciferol, 1000 UNITS TABS Take 4,000 Units by mouth every morning.     No current facility-administered medications on file prior to visit.      Objective:  Objective  Physical Exam  Constitutional: She is oriented to person, place, and time. She appears well-developed and well-nourished.  HENT:  Head: Normocephalic and atraumatic.  Eyes: Conjunctivae and EOM are normal.  Neck: Normal range of motion. Neck supple. No JVD present. Carotid bruit is not present. No thyromegaly present.  Cardiovascular: Normal rate, regular rhythm and normal heart sounds.   No murmur heard. Pulmonary/Chest: Effort normal and breath sounds normal. No respiratory distress. She has no wheezes. She has no rales. She exhibits no tenderness.  Musculoskeletal: She exhibits no edema.  Neurological: She is alert and oriented to person, place, and time.  Psychiatric: She has a normal mood and affect. Her behavior is normal. Thought content normal.  Nursing note and vitals reviewed.  BP (!) 98/56 (BP Location: Right Arm, Patient Position: Sitting, Cuff Size: Normal)   Pulse 72   Temp 98.4 F (36.9 C) (Oral)   Resp 16   Ht 5\' 3"  (1.6 m)   Wt 164 lb 6.4 oz (74.6 kg)   SpO2 99%   BMI 29.12 kg/m  Wt Readings from Last 3 Encounters:  09/14/16 164 lb 6.4 oz (74.6 kg)  09/08/16 162 lb (73.5 kg)  07/14/16 164 lb (74.4 kg)     Lab Results  Component Value Date   WBC 5.5 09/08/2016   HGB 6.0 (LL) 09/08/2016   HCT 24.6 (L) 09/08/2016   PLT 167 Large & giant platelets 09/08/2016   GLUCOSE 134 (H) 09/08/2016   CHOL 78 (L) 01/21/2016   TRIG 54 01/21/2016   HDL 33 (L) 01/21/2016   LDLCALC 34 01/21/2016   ALT 19 09/08/2016   AST 24 09/08/2016   NA 140 09/08/2016   K 3.4 09/08/2016  CL 102 09/08/2016   CREATININE 0.9 09/08/2016   BUN 9 09/08/2016   CO2 30  09/08/2016   TSH 0.03 (L) 06/08/2016   INR 1.15 11/21/2012   HGBA1C 5.5 10/07/2012    Mm Screening Breast Tomo Bilateral  Result Date: 08/13/2016 CLINICAL DATA:  Screening. EXAM: 2D DIGITAL SCREENING BILATERAL MAMMOGRAM WITH CAD AND ADJUNCT TOMO COMPARISON:  Previous exam(s). ACR Breast Density Category b: There are scattered areas of fibroglandular density. FINDINGS: There are no findings suspicious for malignancy. Images were processed with CAD. IMPRESSION: No mammographic evidence of malignancy. A result letter of this screening mammogram will be mailed directly to the patient. RECOMMENDATION: Screening mammogram in one year. (Code:SM-B-01Y) BI-RADS CATEGORY  1: Negative. Electronically Signed   By: Franki Cabot M.D.   On: 08/13/2016 16:01     Assessment & Plan:  Plan  I am having Ms. Dusek start on potassium chloride. I am also having her maintain her fexofenadine, aspirin, diphenhydrAMINE, docusate sodium, mineral oil, Cascara [place], Garlic Oil, magnesium oxide, promethazine, lisinopril, triamcinolone cream, nitroGLYCERIN, Sennosides (SENNA LAX PO), Vitamin D (Cholecalciferol), lidocaine-prilocaine, Misc Natural Products (TART CHERRY ADVANCED PO), Ipratropium-Albuterol, guaiFENesin, clindamycin, NONFORMULARY OR COMPOUNDED ITEM, bisoprolol, furosemide, hydrochlorothiazide, levETIRAcetam, fesoterodine, thyroid, ondansetron, clopidogrel, thyroid, escitalopram, ARMOUR THYROID, famotidine, famotidine, potassium chloride, ARMOUR THYROID, cyclobenzaprine, oxyCODONE, oxyCODONE, oxyCODONE, ALPRAZolam, metoCLOPramide, temazepam, and ciprofloxacin.  Meds ordered this encounter  Medications  . potassium chloride (K-DUR) 10 MEQ tablet    Sig: KCL 10 meq usher smith or Any MFT, 8 po qd    Dispense:  240 tablet    Refill:  2    Problem List Items Addressed This Visit    None    Visit Diagnoses    Hypokalemia    -  Primary   Relevant Medications   potassium chloride (K-DUR) 10 MEQ tablet    Bilateral edema of lower extremity          Follow-up: Return in about 6 months (around 03/16/2017).  Ann Held, DO

## 2016-09-21 ENCOUNTER — Other Ambulatory Visit: Payer: Self-pay | Admitting: Hematology & Oncology

## 2016-09-22 ENCOUNTER — Other Ambulatory Visit (HOSPITAL_BASED_OUTPATIENT_CLINIC_OR_DEPARTMENT_OTHER): Payer: Medicare Other

## 2016-09-22 ENCOUNTER — Telehealth: Payer: Self-pay | Admitting: *Deleted

## 2016-09-22 DIAGNOSIS — D45 Polycythemia vera: Secondary | ICD-10-CM

## 2016-09-22 LAB — CBC WITH DIFFERENTIAL (CANCER CENTER ONLY)
BASO#: 0 10*3/uL (ref 0.0–0.2)
BASO%: 0.2 % (ref 0.0–2.0)
EOS%: 3.2 % (ref 0.0–7.0)
Eosinophils Absolute: 0.2 10*3/uL (ref 0.0–0.5)
HEMATOCRIT: 20.6 % — AB (ref 34.8–46.6)
HEMOGLOBIN: 5 g/dL — AB (ref 11.6–15.9)
LYMPH#: 0.7 10*3/uL — AB (ref 0.9–3.3)
LYMPH%: 14.5 % (ref 14.0–48.0)
MCH: 17.2 pg — ABNORMAL LOW (ref 26.0–34.0)
MCHC: 24.3 g/dL — ABNORMAL LOW (ref 32.0–36.0)
MCV: 71 fL — ABNORMAL LOW (ref 81–101)
MONO#: 0.7 10*3/uL (ref 0.1–0.9)
MONO%: 14.3 % — AB (ref 0.0–13.0)
NEUT%: 67.8 % (ref 39.6–80.0)
NEUTROS ABS: 3.1 10*3/uL (ref 1.5–6.5)
Platelets: 128 10*3/uL — ABNORMAL LOW (ref 145–400)
RBC: 2.91 10*6/uL — ABNORMAL LOW (ref 3.70–5.32)
RDW: 21.4 % — ABNORMAL HIGH (ref 11.1–15.7)
WBC: 4.6 10*3/uL (ref 3.9–10.0)

## 2016-09-22 NOTE — Telephone Encounter (Signed)
Critical Value Hgb 5.0 Dr Marin Olp notified. No orders at this time

## 2016-10-04 ENCOUNTER — Other Ambulatory Visit: Payer: Self-pay | Admitting: *Deleted

## 2016-10-04 ENCOUNTER — Ambulatory Visit (HOSPITAL_BASED_OUTPATIENT_CLINIC_OR_DEPARTMENT_OTHER): Payer: Medicare Other

## 2016-10-04 DIAGNOSIS — M797 Fibromyalgia: Secondary | ICD-10-CM

## 2016-10-04 DIAGNOSIS — D45 Polycythemia vera: Secondary | ICD-10-CM

## 2016-10-04 DIAGNOSIS — M62838 Other muscle spasm: Secondary | ICD-10-CM

## 2016-10-04 DIAGNOSIS — G4700 Insomnia, unspecified: Secondary | ICD-10-CM

## 2016-10-04 DIAGNOSIS — I358 Other nonrheumatic aortic valve disorders: Secondary | ICD-10-CM

## 2016-10-04 MED ORDER — OXYCODONE HCL ER 20 MG PO T12A: 20.0000 mg | EXTENDED_RELEASE_TABLET | Freq: Three times a day (TID) | ORAL | 0 refills | Status: DC

## 2016-10-04 MED ORDER — OXYCODONE HCL 15 MG PO TABS: 15.0000 mg | ORAL_TABLET | Freq: Four times a day (QID) | ORAL | 0 refills | Status: DC | PRN

## 2016-10-04 MED ORDER — OXYCODONE HCL ER 10 MG PO T12A: 10.0000 mg | EXTENDED_RELEASE_TABLET | Freq: Two times a day (BID) | ORAL | 0 refills | Status: DC

## 2016-10-05 ENCOUNTER — Telehealth: Payer: Self-pay | Admitting: *Deleted

## 2016-10-05 NOTE — Telephone Encounter (Signed)
Patient mailed letter with RE: Complaint; she stated that when she was in office for appointment with PCP on 09/14/16 that she "updated her medication list" with detailed information. She then states that when she received her AVS that her medication list "was not updated" and that she "strenuously objects to requesting her input and then ignoring her response"Called patient. Advised patient of provider's approval for requested procedure, as well as any comments/instructions from provider. Patient has enclosed updated copy of her medication and requesting the information "be entered into her file, veriifed as correct, and be returned to her". Patient then states that "attention to this complaint, and the correction of this complaint is required for reception of proper health care"; forwarded to [place] for resolve/SLS 07/10

## 2016-10-06 ENCOUNTER — Ambulatory Visit (HOSPITAL_BASED_OUTPATIENT_CLINIC_OR_DEPARTMENT_OTHER)
Admission: RE | Admit: 2016-10-06 | Discharge: 2016-10-06 | Disposition: A | Discharge status: Home / Self Care | Payer: Medicare Other | Source: Ambulatory Visit | Attending: Family Medicine | Admitting: Family Medicine

## 2016-10-06 ENCOUNTER — Other Ambulatory Visit: Payer: Self-pay | Admitting: *Deleted

## 2016-10-06 ENCOUNTER — Other Ambulatory Visit (HOSPITAL_BASED_OUTPATIENT_CLINIC_OR_DEPARTMENT_OTHER): Payer: Medicare Other

## 2016-10-06 ENCOUNTER — Telehealth: Payer: Self-pay | Admitting: *Deleted

## 2016-10-06 DIAGNOSIS — D45 Polycythemia vera: Secondary | ICD-10-CM | POA: Diagnosis present

## 2016-10-06 DIAGNOSIS — I358 Other nonrheumatic aortic valve disorders: Secondary | ICD-10-CM

## 2016-10-06 DIAGNOSIS — M62838 Other muscle spasm: Secondary | ICD-10-CM

## 2016-10-06 DIAGNOSIS — M797 Fibromyalgia: Secondary | ICD-10-CM

## 2016-10-06 DIAGNOSIS — E049 Nontoxic goiter, unspecified: Secondary | ICD-10-CM

## 2016-10-06 DIAGNOSIS — G4700 Insomnia, unspecified: Secondary | ICD-10-CM

## 2016-10-06 DIAGNOSIS — E042 Nontoxic multinodular goiter: Secondary | ICD-10-CM | POA: Insufficient documentation

## 2016-10-06 DIAGNOSIS — E039 Hypothyroidism, unspecified: Secondary | ICD-10-CM | POA: Diagnosis not present

## 2016-10-06 LAB — CBC WITH DIFFERENTIAL (CANCER CENTER ONLY)
BASO#: 0 10*3/uL (ref 0.0–0.2)
BASO%: 0.2 % (ref 0.0–2.0)
EOS ABS: 0.4 10*3/uL (ref 0.0–0.5)
EOS%: 6.9 % (ref 0.0–7.0)
HEMATOCRIT: 20.8 % — AB (ref 34.8–46.6)
HEMOGLOBIN: 5 g/dL — AB (ref 11.6–15.9)
LYMPH#: 0.8 10*3/uL — AB (ref 0.9–3.3)
LYMPH%: 15.1 % (ref 14.0–48.0)
MCH: 16.6 pg — AB (ref 26.0–34.0)
MCHC: 24 g/dL — ABNORMAL LOW (ref 32.0–36.0)
MCV: 69 fL — AB (ref 81–101)
MONO#: 0.7 10*3/uL (ref 0.1–0.9)
MONO%: 13.4 % — ABNORMAL HIGH (ref 0.0–13.0)
NEUT#: 3.6 10*3/uL (ref 1.5–6.5)
NEUT%: 64.4 % (ref 39.6–80.0)
RBC: 3.01 10*6/uL — AB (ref 3.70–5.32)
RDW: 20.7 % — ABNORMAL HIGH (ref 11.1–15.7)
WBC: 5.5 10*3/uL (ref 3.9–10.0)

## 2016-10-06 MED ORDER — TEMAZEPAM 30 MG PO CAPS: ORAL_CAPSULE | ORAL | 1 refills | Status: DC

## 2016-10-06 MED FILL — ALPRAZolam 0.5 MG TABS: 0.5 | 30 days supply | Qty: 60 | Fill #0

## 2016-10-06 NOTE — Telephone Encounter (Signed)
Critical Value Hgb 5.0 Dr Marin Olp notified. No orders at this time

## 2016-10-08 ENCOUNTER — Telehealth: Payer: Self-pay

## 2016-10-08 MED FILL — OxyCONTIN 10 MG T12A: 10 | 30 days supply | Qty: 60 | Fill #0

## 2016-10-08 NOTE — Telephone Encounter (Addendum)
Received letter from patient requesting that her medication list be updated.  Medication list reviewed and updated as requested.  Called patient and made both her and her husband aware.  Updated list mailed.

## 2016-10-13 MED FILL — TEMAZEPAM 30 MG CAPSULE: 30 | 30 days supply | Qty: 30 | Fill #0

## 2016-10-20 ENCOUNTER — Other Ambulatory Visit (HOSPITAL_BASED_OUTPATIENT_CLINIC_OR_DEPARTMENT_OTHER): Payer: Medicare Other

## 2016-10-20 ENCOUNTER — Telehealth: Payer: Self-pay | Admitting: *Deleted

## 2016-10-20 DIAGNOSIS — D45 Polycythemia vera: Secondary | ICD-10-CM | POA: Diagnosis present

## 2016-10-20 LAB — CBC WITH DIFFERENTIAL (CANCER CENTER ONLY)
BASO#: 0 10*3/uL (ref 0.0–0.2)
BASO%: 0.2 % (ref 0.0–2.0)
EOS%: 7.5 % — AB (ref 0.0–7.0)
Eosinophils Absolute: 0.3 10*3/uL (ref 0.0–0.5)
HCT: 20.9 % — ABNORMAL LOW (ref 34.8–46.6)
HEMOGLOBIN: 5.1 g/dL — AB (ref 11.6–15.9)
LYMPH#: 0.7 10*3/uL — ABNORMAL LOW (ref 0.9–3.3)
LYMPH%: 16.6 % (ref 14.0–48.0)
MCH: 17.3 pg — AB (ref 26.0–34.0)
MCHC: 24.4 g/dL — AB (ref 32.0–36.0)
MCV: 71 fL — ABNORMAL LOW (ref 81–101)
MONO#: 0.6 10*3/uL (ref 0.1–0.9)
MONO%: 13.6 % — AB (ref 0.0–13.0)
NEUT%: 62.1 % (ref 39.6–80.0)
NEUTROS ABS: 2.7 10*3/uL (ref 1.5–6.5)
PLATELETS: 114 10*3/uL — AB (ref 145–400)
RBC: 2.94 10*6/uL — ABNORMAL LOW (ref 3.70–5.32)
RDW: 22 % — ABNORMAL HIGH (ref 11.1–15.7)
WBC: 4.3 10*3/uL (ref 3.9–10.0)

## 2016-10-20 NOTE — Telephone Encounter (Signed)
Critical Value Hgb 5.1 Dr Marin Olp notified. No order at this time

## 2016-11-01 ENCOUNTER — Other Ambulatory Visit: Payer: Self-pay | Admitting: *Deleted

## 2016-11-01 DIAGNOSIS — I358 Other nonrheumatic aortic valve disorders: Secondary | ICD-10-CM

## 2016-11-01 DIAGNOSIS — D45 Polycythemia vera: Secondary | ICD-10-CM

## 2016-11-01 DIAGNOSIS — M797 Fibromyalgia: Secondary | ICD-10-CM

## 2016-11-01 DIAGNOSIS — M62838 Other muscle spasm: Secondary | ICD-10-CM

## 2016-11-01 DIAGNOSIS — G4700 Insomnia, unspecified: Secondary | ICD-10-CM

## 2016-11-01 MED ORDER — OXYCODONE HCL 15 MG PO TABS: 15.0000 mg | ORAL_TABLET | Freq: Four times a day (QID) | ORAL | 0 refills | Status: DC | PRN

## 2016-11-01 MED ORDER — OXYCODONE HCL ER 20 MG PO T12A: 20.0000 mg | EXTENDED_RELEASE_TABLET | Freq: Three times a day (TID) | ORAL | 0 refills | Status: DC

## 2016-11-01 MED ORDER — OXYCODONE HCL ER 10 MG PO T12A: 10.0000 mg | EXTENDED_RELEASE_TABLET | Freq: Two times a day (BID) | ORAL | 0 refills | Status: DC

## 2016-11-03 ENCOUNTER — Other Ambulatory Visit: Payer: Self-pay | Admitting: Hematology & Oncology

## 2016-11-03 ENCOUNTER — Ambulatory Visit (HOSPITAL_BASED_OUTPATIENT_CLINIC_OR_DEPARTMENT_OTHER): Payer: Medicare Other | Admitting: Hematology & Oncology

## 2016-11-03 ENCOUNTER — Telehealth: Payer: Self-pay | Admitting: *Deleted

## 2016-11-03 ENCOUNTER — Other Ambulatory Visit (HOSPITAL_BASED_OUTPATIENT_CLINIC_OR_DEPARTMENT_OTHER): Payer: Medicare Other

## 2016-11-03 VITALS — BP 119/45 | HR 76 | Temp 98.3°F | Resp 17 | Wt 164.0 lb

## 2016-11-03 DIAGNOSIS — D45 Polycythemia vera: Secondary | ICD-10-CM

## 2016-11-03 DIAGNOSIS — M797 Fibromyalgia: Secondary | ICD-10-CM

## 2016-11-03 DIAGNOSIS — D649 Anemia, unspecified: Secondary | ICD-10-CM | POA: Diagnosis not present

## 2016-11-03 DIAGNOSIS — I358 Other nonrheumatic aortic valve disorders: Secondary | ICD-10-CM

## 2016-11-03 DIAGNOSIS — G4700 Insomnia, unspecified: Secondary | ICD-10-CM | POA: Diagnosis not present

## 2016-11-03 DIAGNOSIS — M62838 Other muscle spasm: Secondary | ICD-10-CM

## 2016-11-03 DIAGNOSIS — Z8673 Personal history of transient ischemic attack (TIA), and cerebral infarction without residual deficits: Secondary | ICD-10-CM | POA: Diagnosis not present

## 2016-11-03 DIAGNOSIS — E611 Iron deficiency: Secondary | ICD-10-CM | POA: Diagnosis not present

## 2016-11-03 DIAGNOSIS — M7989 Other specified soft tissue disorders: Secondary | ICD-10-CM

## 2016-11-03 LAB — CMP (CANCER CENTER ONLY)
ALT: 13 U/L (ref 10–47)
AST: 24 U/L (ref 11–38)
Albumin: 3.4 g/dL (ref 3.3–5.5)
Alkaline Phosphatase: 90 U/L — ABNORMAL HIGH (ref 26–84)
BUN: 11 mg/dL (ref 7–22)
CHLORIDE: 101 meq/L (ref 98–108)
CO2: 33 mEq/L (ref 18–33)
CREATININE: 0.9 mg/dL (ref 0.6–1.2)
Calcium: 9.2 mg/dL (ref 8.0–10.3)
GLUCOSE: 93 mg/dL (ref 73–118)
POTASSIUM: 3.6 meq/L (ref 3.3–4.7)
SODIUM: 138 meq/L (ref 128–145)
TOTAL PROTEIN: 6.5 g/dL (ref 6.4–8.1)
Total Bilirubin: 0.9 mg/dl (ref 0.20–1.60)

## 2016-11-03 LAB — CBC WITH DIFFERENTIAL (CANCER CENTER ONLY)
BASO#: 0 10*3/uL (ref 0.0–0.2)
BASO%: 0.2 % (ref 0.0–2.0)
EOS%: 4.7 % (ref 0.0–7.0)
Eosinophils Absolute: 0.2 10*3/uL (ref 0.0–0.5)
HCT: 22.2 % — ABNORMAL LOW (ref 34.8–46.6)
HGB: 5.5 g/dL — CL (ref 11.6–15.9)
LYMPH#: 0.6 10*3/uL — ABNORMAL LOW (ref 0.9–3.3)
LYMPH%: 13.4 % — AB (ref 14.0–48.0)
MCH: 17.2 pg — AB (ref 26.0–34.0)
MCHC: 24.8 g/dL — ABNORMAL LOW (ref 32.0–36.0)
MCV: 70 fL — AB (ref 81–101)
MONO#: 0.5 10*3/uL (ref 0.1–0.9)
MONO%: 12.1 % (ref 0.0–13.0)
NEUT#: 3.1 10*3/uL (ref 1.5–6.5)
NEUT%: 69.6 % (ref 39.6–80.0)
PLATELETS: 156 10*3/uL (ref 145–400)
RBC: 3.19 10*6/uL — AB (ref 3.70–5.32)
RDW: 21.7 % — AB (ref 11.1–15.7)
WBC: 4.5 10*3/uL (ref 3.9–10.0)

## 2016-11-03 LAB — LACTATE DEHYDROGENASE: LDH: 179 U/L (ref 125–245)

## 2016-11-03 NOTE — Telephone Encounter (Signed)
Critical Value Hgb 5.5 Dr Ennever notified. No orders at this time 

## 2016-11-03 NOTE — Progress Notes (Signed)
Hematology and Oncology Follow Up Visit  [name] [id number] 08-26-1945 71 y.o. 09/08/2016   Principle Diagnosis:  Polycythemia vera -- hyperviscosity variant. 2. 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  Current Therapy:    Phlebotomy to maintain hemoglobin below 6.0     Interim History:  [name] is back for followup. She is feeling better. One of the problems that she is having is that there is some swelling in her legs. I suspect this might be from her being anemic. However, given her history of polycythemia, I think we have to make sure she does not have thromboembolic disease. We will set her up for Dopplers of the lower legs tomorrow.  She is having some problems with fibromyalgia. Because of the hot, humid and rainy weather, her pharmacy now to has flared up on her on several occasions.  She is sleeping pretty well. This is very important for her.  She is clearly iron deficient. We have made her iron deficient from our phlebotomies.  Her appetite is good. She's had no nausea or vomiting. She's had no change in bowel or bladder habits.   Overall, her performance status is ECOG 1.   Medications:Allergies:  Allergies  Allergen Reactions  . Ethrane [Enflurane] Nausea And Vomiting  . Codeine Nausea Only  . Synthroid [Levothyroxine Sodium] Other (See Comments)    Not effective. Causes excessive sleepiness. Takes armour thyroid  . Calcium Channel Blockers Swelling, Palpitations and Hypertension  . Penicillins Rash    Past Medical History, Surgical history, Social history, and Family History were reviewed and updated.  Review of Systems: As above  Physical Exam:  weight is 162 lb (73.5 kg). Her oral temperature is 98.4 F (36.9 C). Her blood pressure is 138/50 (abnormal) and her pulse is 79. Her respiration is 20 and oxygen saturation is 100%.   Well-developed and well-nourished white female in no obvious distress. Head and  neck exam shows pale conjunctiva. There is no oral lesions. There is no adenopathy in the neck. Lungs are clear bilaterally. Cardiac exam regular rate and rhythm with a 1/6 systolic ejection murmur. Abdomen is soft. Has good bowel sounds. There is no palpable liver or spleen tip appreciative well-healed laparotomy scars. Back exam no tenderness over the spine ribs or hips. Extremities shows no clubbing cyanosis or edema. Neurological exam shows no focal neurological deficits. Skin exam no rashes, ecchymoses or petechia.  Lab Results  Component Value Date   WBC 5.5 09/08/2016   HGB 6.0 (LL) 09/08/2016   HCT 24.6 (L) 09/08/2016   MCV 70 (L) 09/08/2016   PLT 167 Large & giant platelets 09/08/2016     Chemistry      Component Value Date/Time   NA 140 09/08/2016 1336   NA 138 03/24/2016 1306   K 3.4 09/08/2016 1336   K 3.3 (L) 03/24/2016 1306   CL 102 09/08/2016 1336   CO2 30 09/08/2016 1336   CO2 31 (H) 03/24/2016 1306   BUN 9 09/08/2016 1336   BUN 8.2 03/24/2016 1306   CREATININE 0.9 09/08/2016 1336   CREATININE 0.8 03/24/2016 1306      Component Value Date/Time   CALCIUM 8.9 09/08/2016 1336   CALCIUM 9.2 03/24/2016 1306   ALKPHOS 104 (H) 09/08/2016 1336   ALKPHOS 114 03/24/2016 1306   AST 24 09/08/2016 1336   AST 15 03/24/2016 1306   ALT 19 09/08/2016 1336   ALT 7 03/24/2016 1306   BILITOT  0.90 09/08/2016 1336   BILITOT 0.83 03/24/2016 1306         Impression and Plan: [name] is 71 year old female with polycythemia vera. She is very hyperviscous.  She does not need to be phlebotomized today.  We check her every 2 weeks.  I truly see her back in 2 months.   As always, I spent about 35 minutes with she and her husband. We was have very good fellowship..   . Volanda Napoleon, MD 6/13/20183:35 PM

## 2016-11-04 ENCOUNTER — Ambulatory Visit (HOSPITAL_BASED_OUTPATIENT_CLINIC_OR_DEPARTMENT_OTHER)
Admission: RE | Admit: 2016-11-04 | Discharge: 2016-11-04 | Disposition: A | Discharge status: Home / Self Care | Payer: Medicare Other | Source: Ambulatory Visit | Attending: Hematology & Oncology | Admitting: Hematology & Oncology

## 2016-11-04 DIAGNOSIS — D45 Polycythemia vera: Secondary | ICD-10-CM | POA: Diagnosis not present

## 2016-11-04 DIAGNOSIS — M7989 Other specified soft tissue disorders: Secondary | ICD-10-CM | POA: Insufficient documentation

## 2016-11-04 LAB — RETICULOCYTES: RETICULOCYTE COUNT: 1.5 % (ref 0.6–2.6)

## 2016-11-05 ENCOUNTER — Telehealth: Payer: Self-pay | Admitting: *Deleted

## 2016-11-05 MED FILL — OxyCONTIN 10 MG T12A: 10 | 30 days supply | Qty: 60 | Fill #0

## 2016-11-05 MED FILL — ALPRAZolam 0.5 MG TABS: 0.5 | 30 days supply | Qty: 60 | Fill #0

## 2016-11-05 NOTE — Telephone Encounter (Addendum)
Patient aware of results  ----- Message from Volanda Napoleon, MD sent at 11/04/2016  2:05 PM EDT ----- Call - NO blood clot in the legs!!  pete

## 2016-11-10 MED FILL — TEMAZEPAM 30 MG CAPSULE: 30 | 30 days supply | Qty: 30 | Fill #1

## 2016-11-17 ENCOUNTER — Telehealth: Payer: Self-pay | Admitting: *Deleted

## 2016-11-17 ENCOUNTER — Other Ambulatory Visit (HOSPITAL_BASED_OUTPATIENT_CLINIC_OR_DEPARTMENT_OTHER): Payer: Medicare Other

## 2016-11-17 DIAGNOSIS — M7989 Other specified soft tissue disorders: Secondary | ICD-10-CM | POA: Diagnosis not present

## 2016-11-17 DIAGNOSIS — D45 Polycythemia vera: Secondary | ICD-10-CM

## 2016-11-17 LAB — CMP (CANCER CENTER ONLY)
ALK PHOS: 91 U/L — AB (ref 26–84)
ALT: 10 U/L (ref 10–47)
AST: 22 U/L (ref 11–38)
Albumin: 3.6 g/dL (ref 3.3–5.5)
BILIRUBIN TOTAL: 1.1 mg/dL (ref 0.20–1.60)
BUN, Bld: 14 mg/dL (ref 7–22)
CALCIUM: 8.9 mg/dL (ref 8.0–10.3)
CO2: 33 mEq/L (ref 18–33)
Chloride: 100 mEq/L (ref 98–108)
Creat: 1.1 mg/dl (ref 0.6–1.2)
GLUCOSE: 93 mg/dL (ref 73–118)
Potassium: 3.9 mEq/L (ref 3.3–4.7)
Sodium: 144 mEq/L (ref 128–145)
TOTAL PROTEIN: 6.8 g/dL (ref 6.4–8.1)

## 2016-11-17 LAB — CBC WITH DIFFERENTIAL (CANCER CENTER ONLY)
BASO#: 0 10*3/uL (ref 0.0–0.2)
BASO%: 0.2 % (ref 0.0–2.0)
EOS%: 3.8 % (ref 0.0–7.0)
Eosinophils Absolute: 0.2 10*3/uL (ref 0.0–0.5)
HEMATOCRIT: 23.2 % — AB (ref 34.8–46.6)
HEMOGLOBIN: 5.7 g/dL — AB (ref 11.6–15.9)
LYMPH#: 0.7 10*3/uL — AB (ref 0.9–3.3)
LYMPH%: 14.7 % (ref 14.0–48.0)
MCH: 17.2 pg — ABNORMAL LOW (ref 26.0–34.0)
MCHC: 24.6 g/dL — ABNORMAL LOW (ref 32.0–36.0)
MCV: 70 fL — ABNORMAL LOW (ref 81–101)
MONO#: 0.5 10*3/uL (ref 0.1–0.9)
MONO%: 12.2 % (ref 0.0–13.0)
NEUT%: 69.1 % (ref 39.6–80.0)
NEUTROS ABS: 3.1 10*3/uL (ref 1.5–6.5)
Platelets: 110 10*3/uL — ABNORMAL LOW (ref 145–400)
RBC: 3.32 10*6/uL — AB (ref 3.70–5.32)
RDW: 21.4 % — AB (ref 11.1–15.7)
WBC: 4.4 10*3/uL (ref 3.9–10.0)

## 2016-11-17 NOTE — Telephone Encounter (Signed)
Critical Value Hgb 5.7 Dr Ennever notified. No orders at this time 

## 2016-11-18 ENCOUNTER — Ambulatory Visit (HOSPITAL_BASED_OUTPATIENT_CLINIC_OR_DEPARTMENT_OTHER): Payer: Medicare Other

## 2016-11-18 VITALS — BP 97/71 | HR 62 | Temp 97.7°F | Resp 16

## 2016-11-18 DIAGNOSIS — D45 Polycythemia vera: Secondary | ICD-10-CM

## 2016-11-18 LAB — IRON AND TIBC
%SAT: 4 % — AB (ref 21–57)
Iron: 17 ug/dL — ABNORMAL LOW (ref 41–142)
TIBC: 473 ug/dL — ABNORMAL HIGH (ref 236–444)
UIBC: 456 ug/dL — ABNORMAL HIGH (ref 120–384)

## 2016-11-18 LAB — FERRITIN

## 2016-11-18 LAB — RETICULOCYTES: RETICULOCYTE COUNT: 1.6 % (ref 0.6–2.6)

## 2016-11-18 NOTE — Progress Notes (Signed)
Fabio Pierce presents today for phlebotomy per MD orders. Phlebotomy procedure started at 1437 and ended at 1445. 257 grams removed from lt [name] using 16g phlebotomy kit. Patient observed for 30 minutes after procedure without any incident. Patient tolerated procedure well. IV needle removed intact.

## 2016-11-18 NOTE — Patient Instructions (Signed)
Therapeutic Phlebotomy Therapeutic phlebotomy is the controlled removal of blood from a person's body for the purpose of treating a medical condition. The procedure is similar to donating blood. Usually, about a pint (470 mL, or 0.47L) of blood is removed. The average adult has 9-12 pints (4.3-5.7 L) of blood. Therapeutic phlebotomy may be used to treat the following medical conditions:  Hemochromatosis. This is a condition in which the blood contains too much iron.  Polycythemia vera. This is a condition in which the blood contains too many red blood cells.  Porphyria cutanea tarda. This is a disease in which an important part of hemoglobin is not made properly. It results in the buildup of abnormal amounts of porphyrins in the body.  Sickle cell disease. This is a condition in which the red blood cells form an abnormal crescent shape rather than a round shape.  Tell a health care provider about:  Any allergies you have.  All medicines you are taking, including vitamins, herbs, eye drops, creams, and over-the-counter medicines.  Any problems you or family members have had with anesthetic medicines.  Any blood disorders you have.  Any surgeries you have had.  Any medical conditions you have. What are the risks? Generally, this is a safe procedure. However, problems may occur, including:  Nausea or light-headedness.  Low blood pressure.  Soreness, bleeding, swelling, or bruising at the needle insertion site.  Infection.  What happens before the procedure?  Follow instructions from your health care provider about eating or drinking restrictions.  Ask your health care provider about changing or stopping your regular medicines. This is especially important if you are taking diabetes medicines or blood thinners.  Wear clothing with sleeves that can be raised above the elbow.  Plan to have someone take you home after the procedure.  You may have a blood sample taken. What  happens during the procedure?  A needle will be inserted into one of your veins.  Tubing and a collection bag will be attached to that needle.  Blood will flow through the needle and tubing into the collection bag.  You may be asked to open and close your hand slowly and continually during the entire collection.  After the specified amount of blood has been removed from your body, the collection bag and tubing will be clamped.  The needle will be removed from your vein.  Pressure will be held on the site of the needle insertion to stop the bleeding.  A bandage (dressing) will be placed over the needle insertion site. The procedure may vary among health care providers and hospitals. What happens after the procedure?  Your recovery will be assessed and monitored.  You can return to your normal activities as directed by your health care provider. This information is not intended to replace advice given to you by your health care provider. Make sure you discuss any questions you have with your health care provider. Document Released: 08/17/2010 Document Revised: 11/15/2015 Document Reviewed: 03/11/2014 Elsevier Interactive Patient Education  2018 [employer]  

## 2016-11-26 ENCOUNTER — Other Ambulatory Visit: Payer: Self-pay | Admitting: *Deleted

## 2016-11-30 ENCOUNTER — Other Ambulatory Visit: Payer: Self-pay | Admitting: *Deleted

## 2016-11-30 DIAGNOSIS — I358 Other nonrheumatic aortic valve disorders: Secondary | ICD-10-CM

## 2016-11-30 DIAGNOSIS — G4700 Insomnia, unspecified: Secondary | ICD-10-CM

## 2016-11-30 DIAGNOSIS — M62838 Other muscle spasm: Secondary | ICD-10-CM

## 2016-11-30 DIAGNOSIS — M797 Fibromyalgia: Secondary | ICD-10-CM

## 2016-11-30 DIAGNOSIS — D45 Polycythemia vera: Secondary | ICD-10-CM

## 2016-11-30 MED ORDER — OXYCODONE HCL 15 MG PO TABS: 15.0000 mg | ORAL_TABLET | Freq: Four times a day (QID) | ORAL | 0 refills | Status: DC | PRN

## 2016-11-30 MED ORDER — OXYCODONE HCL ER 10 MG PO T12A: 10.0000 mg | EXTENDED_RELEASE_TABLET | Freq: Two times a day (BID) | ORAL | 0 refills | Status: DC

## 2016-11-30 MED ORDER — OXYCODONE HCL ER 20 MG PO T12A: 20.0000 mg | EXTENDED_RELEASE_TABLET | Freq: Three times a day (TID) | ORAL | 0 refills | Status: DC

## 2016-12-01 ENCOUNTER — Other Ambulatory Visit: Payer: Self-pay | Admitting: Hematology & Oncology

## 2016-12-01 ENCOUNTER — Other Ambulatory Visit (HOSPITAL_BASED_OUTPATIENT_CLINIC_OR_DEPARTMENT_OTHER): Payer: Medicare Other

## 2016-12-01 DIAGNOSIS — M797 Fibromyalgia: Secondary | ICD-10-CM

## 2016-12-01 DIAGNOSIS — M62838 Other muscle spasm: Secondary | ICD-10-CM

## 2016-12-01 DIAGNOSIS — D45 Polycythemia vera: Secondary | ICD-10-CM

## 2016-12-01 DIAGNOSIS — I358 Other nonrheumatic aortic valve disorders: Secondary | ICD-10-CM

## 2016-12-01 DIAGNOSIS — G4700 Insomnia, unspecified: Secondary | ICD-10-CM

## 2016-12-01 LAB — CBC WITH DIFFERENTIAL (CANCER CENTER ONLY)
BASO#: 0 10*3/uL (ref 0.0–0.2)
BASO%: 0.2 % (ref 0.0–2.0)
EOS ABS: 0.2 10*3/uL (ref 0.0–0.5)
EOS%: 4.6 % (ref 0.0–7.0)
HCT: 22.9 % — ABNORMAL LOW (ref 34.8–46.6)
HEMOGLOBIN: 5.6 g/dL — AB (ref 11.6–15.9)
LYMPH#: 0.6 10*3/uL — ABNORMAL LOW (ref 0.9–3.3)
LYMPH%: 11.6 % — ABNORMAL LOW (ref 14.0–48.0)
MCH: 17 pg — AB (ref 26.0–34.0)
MCHC: 24.5 g/dL — ABNORMAL LOW (ref 32.0–36.0)
MCV: 70 fL — AB (ref 81–101)
MONO#: 0.7 10*3/uL (ref 0.1–0.9)
MONO%: 13.7 % — ABNORMAL HIGH (ref 0.0–13.0)
NEUT#: 3.4 10*3/uL (ref 1.5–6.5)
NEUT%: 69.9 % (ref 39.6–80.0)
Platelets: 128 10*3/uL — ABNORMAL LOW (ref 145–400)
RBC: 3.29 10*6/uL — AB (ref 3.70–5.32)
RDW: 21.3 % — ABNORMAL HIGH (ref 11.1–15.7)
WBC: 4.8 10*3/uL (ref 3.9–10.0)

## 2016-12-01 LAB — TECHNOLOGIST REVIEW CHCC SATELLITE

## 2016-12-02 ENCOUNTER — Other Ambulatory Visit: Payer: Self-pay | Admitting: *Deleted

## 2016-12-02 DIAGNOSIS — M62838 Other muscle spasm: Secondary | ICD-10-CM

## 2016-12-02 DIAGNOSIS — G4700 Insomnia, unspecified: Secondary | ICD-10-CM

## 2016-12-02 DIAGNOSIS — I358 Other nonrheumatic aortic valve disorders: Secondary | ICD-10-CM

## 2016-12-02 DIAGNOSIS — D45 Polycythemia vera: Secondary | ICD-10-CM

## 2016-12-02 DIAGNOSIS — M797 Fibromyalgia: Secondary | ICD-10-CM

## 2016-12-03 MED FILL — OxyCONTIN 10 MG T12A: 10 | 30 days supply | Qty: 60 | Fill #0

## 2016-12-09 ENCOUNTER — Ambulatory Visit (HOSPITAL_BASED_OUTPATIENT_CLINIC_OR_DEPARTMENT_OTHER): Payer: Medicare Other

## 2016-12-09 ENCOUNTER — Telehealth: Payer: Self-pay | Admitting: *Deleted

## 2016-12-09 ENCOUNTER — Other Ambulatory Visit (HOSPITAL_BASED_OUTPATIENT_CLINIC_OR_DEPARTMENT_OTHER): Payer: Medicare Other

## 2016-12-09 VITALS — BP 118/86 | HR 62 | Temp 98.0°F | Resp 16

## 2016-12-09 DIAGNOSIS — D45 Polycythemia vera: Secondary | ICD-10-CM

## 2016-12-09 LAB — CBC WITH DIFFERENTIAL (CANCER CENTER ONLY)
BASO#: 0 10*3/uL (ref 0.0–0.2)
BASO%: 0.2 % (ref 0.0–2.0)
EOS%: 5.2 % (ref 0.0–7.0)
Eosinophils Absolute: 0.3 10*3/uL (ref 0.0–0.5)
HCT: 23.3 % — ABNORMAL LOW (ref 34.8–46.6)
HGB: 5.8 g/dL — CL (ref 11.6–15.9)
LYMPH#: 0.6 10*3/uL — ABNORMAL LOW (ref 0.9–3.3)
LYMPH%: 11.8 % — AB (ref 14.0–48.0)
MCH: 17.6 pg — AB (ref 26.0–34.0)
MCHC: 24.9 g/dL — ABNORMAL LOW (ref 32.0–36.0)
MCV: 71 fL — ABNORMAL LOW (ref 81–101)
MONO#: 0.6 10*3/uL (ref 0.1–0.9)
MONO%: 13.3 % — AB (ref 0.0–13.0)
NEUT#: 3.4 10*3/uL (ref 1.5–6.5)
NEUT%: 69.5 % (ref 39.6–80.0)
PLATELETS: 109 10*3/uL — AB (ref 145–400)
RBC: 3.29 10*6/uL — AB (ref 3.70–5.32)
RDW: 21.7 % — ABNORMAL HIGH (ref 11.1–15.7)
WBC: 4.8 10*3/uL (ref 3.9–10.0)

## 2016-12-09 MED FILL — TEMAZEPAM 30 MG CAPSULE: 30 | 30 days supply | Qty: 30 | Fill #0

## 2016-12-09 MED FILL — ALPRAZolam 0.5 MG TABS: 0.5 | 30 days supply | Qty: 60 | Fill #0

## 2016-12-09 NOTE — Progress Notes (Signed)
[name] presents today for phlebotomy per MD orders. Phlebotomy procedure started at 1210 and ended at 1220. 375 grams removed. Patient observed for 30 minutes after procedure without any incident. Patient tolerated procedure well. IV needle removed intact.  1200  OK to do phlebotomy on patient with Hgb of 5.8

## 2016-12-09 NOTE — Telephone Encounter (Signed)
Critical Value Hgb 5.8 Dr Marin Olp notifed. Phlebotomy okayed per Dr Marin Olp

## 2016-12-09 NOTE — Patient Instructions (Signed)
Therapeutic Phlebotomy Therapeutic phlebotomy is the controlled removal of blood from a person's body for the purpose of treating a medical condition. The procedure is similar to donating blood. Usually, about a pint (470 mL, or 0.47L) of blood is removed. The average adult has 9-12 pints (4.3-5.7 L) of blood. Therapeutic phlebotomy may be used to treat the following medical conditions:  Hemochromatosis. This is a condition in which the blood contains too much iron.  Polycythemia vera. This is a condition in which the blood contains too many red blood cells.  Porphyria cutanea tarda. This is a disease in which an important part of hemoglobin is not made properly. It results in the buildup of abnormal amounts of porphyrins in the body.  Sickle cell disease. This is a condition in which the red blood cells form an abnormal crescent shape rather than a round shape.  Tell a health care provider about:  Any allergies you have.  All medicines you are taking, including vitamins, herbs, eye drops, creams, and over-the-counter medicines.  Any problems you or family members have had with anesthetic medicines.  Any blood disorders you have.  Any surgeries you have had.  Any medical conditions you have. What are the risks? Generally, this is a safe procedure. However, problems may occur, including:  Nausea or light-headedness.  Low blood pressure.  Soreness, bleeding, swelling, or bruising at the needle insertion site.  Infection.  What happens before the procedure?  Follow instructions from your health care provider about eating or drinking restrictions.  Ask your health care provider about changing or stopping your regular medicines. This is especially important if you are taking diabetes medicines or blood thinners.  Wear clothing with sleeves that can be raised above the elbow.  Plan to have someone take you home after the procedure.  You may have a blood sample taken. What  happens during the procedure?  A needle will be inserted into one of your veins.  Tubing and a collection bag will be attached to that needle.  Blood will flow through the needle and tubing into the collection bag.  You may be asked to open and close your hand slowly and continually during the entire collection.  After the specified amount of blood has been removed from your body, the collection bag and tubing will be clamped.  The needle will be removed from your vein.  Pressure will be held on the site of the needle insertion to stop the bleeding.  A bandage (dressing) will be placed over the needle insertion site. The procedure may vary among health care providers and hospitals. What happens after the procedure?  Your recovery will be assessed and monitored.  You can return to your normal activities as directed by your health care provider. This information is not intended to replace advice given to you by your health care provider. Make sure you discuss any questions you have with your health care provider. Document Released: 08/17/2010 Document Revised: 11/15/2015 Document Reviewed: 03/11/2014 Elsevier Interactive Patient Education  2018 [employer]  

## 2016-12-15 ENCOUNTER — Other Ambulatory Visit: Payer: Self-pay | Admitting: Family Medicine

## 2016-12-15 ENCOUNTER — Other Ambulatory Visit (HOSPITAL_BASED_OUTPATIENT_CLINIC_OR_DEPARTMENT_OTHER): Payer: Medicare Other

## 2016-12-15 ENCOUNTER — Telehealth: Payer: Self-pay | Admitting: *Deleted

## 2016-12-15 DIAGNOSIS — R600 Localized edema: Secondary | ICD-10-CM

## 2016-12-15 DIAGNOSIS — D45 Polycythemia vera: Secondary | ICD-10-CM | POA: Diagnosis present

## 2016-12-15 DIAGNOSIS — I639 Cerebral infarction, unspecified: Secondary | ICD-10-CM

## 2016-12-15 LAB — CBC WITH DIFFERENTIAL (CANCER CENTER ONLY)
BASO#: 0 10*3/uL (ref 0.0–0.2)
BASO%: 0.2 % (ref 0.0–2.0)
EOS ABS: 0.2 10*3/uL (ref 0.0–0.5)
EOS%: 5.1 % (ref 0.0–7.0)
HCT: 22 % — ABNORMAL LOW (ref 34.8–46.6)
HGB: 5.4 g/dL — CL (ref 11.6–15.9)
LYMPH#: 0.6 10*3/uL — ABNORMAL LOW (ref 0.9–3.3)
LYMPH%: 14.4 % (ref 14.0–48.0)
MCH: 17.3 pg — AB (ref 26.0–34.0)
MCHC: 24.5 g/dL — ABNORMAL LOW (ref 32.0–36.0)
MCV: 70 fL — AB (ref 81–101)
MONO#: 0.6 10*3/uL (ref 0.1–0.9)
MONO%: 14.1 % — AB (ref 0.0–13.0)
NEUT#: 2.7 10*3/uL (ref 1.5–6.5)
NEUT%: 66.2 % (ref 39.6–80.0)
PLATELETS: 134 10*3/uL — AB (ref 145–400)
RBC: 3.13 10*6/uL — AB (ref 3.70–5.32)
RDW: 21.4 % — AB (ref 11.1–15.7)
WBC: 4.1 10*3/uL (ref 3.9–10.0)

## 2016-12-15 NOTE — Telephone Encounter (Signed)
Critical Value Hgb 5.4 Dr Ennever notified. No orders at this time. 

## 2016-12-16 ENCOUNTER — Other Ambulatory Visit: Payer: Self-pay | Admitting: Family Medicine

## 2016-12-16 ENCOUNTER — Other Ambulatory Visit: Payer: Self-pay | Admitting: Hematology & Oncology

## 2016-12-16 DIAGNOSIS — I639 Cerebral infarction, unspecified: Secondary | ICD-10-CM

## 2016-12-16 DIAGNOSIS — I10 Essential (primary) hypertension: Secondary | ICD-10-CM

## 2016-12-16 DIAGNOSIS — R600 Localized edema: Secondary | ICD-10-CM

## 2016-12-16 DIAGNOSIS — N390 Urinary tract infection, site not specified: Secondary | ICD-10-CM

## 2016-12-18 ENCOUNTER — Other Ambulatory Visit: Payer: Self-pay | Admitting: Family Medicine

## 2016-12-18 DIAGNOSIS — I10 Essential (primary) hypertension: Secondary | ICD-10-CM
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
## 2016-12-27 ENCOUNTER — Other Ambulatory Visit: Payer: Self-pay | Admitting: *Deleted

## 2016-12-27 DIAGNOSIS — M62838 Other muscle spasm: Secondary | ICD-10-CM

## 2016-12-27 DIAGNOSIS — D45 Polycythemia vera: Secondary | ICD-10-CM

## 2016-12-27 DIAGNOSIS — M797 Fibromyalgia: Secondary | ICD-10-CM

## 2016-12-27 DIAGNOSIS — I358 Other nonrheumatic aortic valve disorders: Secondary | ICD-10-CM

## 2016-12-27 DIAGNOSIS — G4700 Insomnia, unspecified: Secondary | ICD-10-CM

## 2016-12-27 MED ORDER — OXYCODONE HCL 15 MG PO TABS: 15.0000 mg | ORAL_TABLET | Freq: Four times a day (QID) | ORAL | 0 refills | Status: DC | PRN

## 2016-12-27 MED ORDER — OXYCODONE HCL ER 10 MG PO T12A: 10.0000 mg | EXTENDED_RELEASE_TABLET | Freq: Two times a day (BID) | ORAL | 0 refills | Status: DC

## 2016-12-27 MED ORDER — OXYCODONE HCL ER 20 MG PO T12A: 20.0000 mg | EXTENDED_RELEASE_TABLET | Freq: Three times a day (TID) | ORAL | 0 refills | Status: DC

## 2016-12-29 ENCOUNTER — Other Ambulatory Visit: Payer: Self-pay | Admitting: Hematology & Oncology

## 2016-12-29 ENCOUNTER — Telehealth: Payer: Self-pay | Admitting: *Deleted

## 2016-12-29 ENCOUNTER — Other Ambulatory Visit: Payer: Self-pay | Admitting: *Deleted

## 2016-12-29 ENCOUNTER — Other Ambulatory Visit: Payer: Self-pay | Admitting: Family Medicine

## 2016-12-29 ENCOUNTER — Ambulatory Visit (HOSPITAL_BASED_OUTPATIENT_CLINIC_OR_DEPARTMENT_OTHER): Payer: Medicare Other | Admitting: Hematology & Oncology

## 2016-12-29 ENCOUNTER — Other Ambulatory Visit (HOSPITAL_BASED_OUTPATIENT_CLINIC_OR_DEPARTMENT_OTHER): Payer: Medicare Other

## 2016-12-29 VITALS — BP 112/34 | HR 80 | Temp 98.1°F | Resp 18 | Wt 162.0 lb

## 2016-12-29 DIAGNOSIS — D45 Polycythemia vera: Secondary | ICD-10-CM

## 2016-12-29 DIAGNOSIS — M797 Fibromyalgia: Secondary | ICD-10-CM

## 2016-12-29 DIAGNOSIS — M62838 Other muscle spasm: Secondary | ICD-10-CM

## 2016-12-29 DIAGNOSIS — I358 Other nonrheumatic aortic valve disorders: Secondary | ICD-10-CM

## 2016-12-29 DIAGNOSIS — Z8673 Personal history of transient ischemic attack (TIA), and cerebral infarction without residual deficits: Secondary | ICD-10-CM

## 2016-12-29 DIAGNOSIS — G4700 Insomnia, unspecified: Secondary | ICD-10-CM

## 2016-12-29 DIAGNOSIS — E876 Hypokalemia: Secondary | ICD-10-CM

## 2016-12-29 LAB — CBC WITH DIFFERENTIAL (CANCER CENTER ONLY)
BASO#: 0 10*3/uL (ref 0.0–0.2)
BASO%: 0.4 % (ref 0.0–2.0)
EOS ABS: 0.4 10*3/uL (ref 0.0–0.5)
EOS%: 7.4 % — ABNORMAL HIGH (ref 0.0–7.0)
HCT: 22.6 % — ABNORMAL LOW (ref 34.8–46.6)
HEMOGLOBIN: 5.6 g/dL — AB (ref 11.6–15.9)
LYMPH#: 0.6 10*3/uL — ABNORMAL LOW (ref 0.9–3.3)
LYMPH%: 12.3 % — ABNORMAL LOW (ref 14.0–48.0)
MCH: 17.3 pg — AB (ref 26.0–34.0)
MCHC: 24.8 g/dL — ABNORMAL LOW (ref 32.0–36.0)
MCV: 70 fL — ABNORMAL LOW (ref 81–101)
MONO#: 0.4 10*3/uL (ref 0.1–0.9)
MONO%: 8.4 % (ref 0.0–13.0)
NEUT%: 71.5 % (ref 39.6–80.0)
NEUTROS ABS: 3.7 10*3/uL (ref 1.5–6.5)
Platelets: 137 10*3/uL — ABNORMAL LOW (ref 145–400)
RBC: 3.23 10*6/uL — AB (ref 3.70–5.32)
RDW: 20.1 % — ABNORMAL HIGH (ref 11.1–15.7)
WBC: 5.1 10*3/uL (ref 3.9–10.0)

## 2016-12-29 LAB — CMP (CANCER CENTER ONLY)
ALK PHOS: 91 U/L — AB (ref 26–84)
ALT: 18 U/L (ref 10–47)
AST: 24 U/L (ref 11–38)
Albumin: 3.4 g/dL (ref 3.3–5.5)
BUN: 15 mg/dL (ref 7–22)
CO2: 32 mEq/L (ref 18–33)
CREATININE: 0.8 mg/dL (ref 0.6–1.2)
Calcium: 9.2 mg/dL (ref 8.0–10.3)
Chloride: 101 mEq/L (ref 98–108)
GLUCOSE: 107 mg/dL (ref 73–118)
POTASSIUM: 3.5 meq/L (ref 3.3–4.7)
SODIUM: 143 meq/L (ref 128–145)
TOTAL PROTEIN: 6.5 g/dL (ref 6.4–8.1)
Total Bilirubin: 0.8 mg/dl (ref 0.20–1.60)
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
## 2016-12-29 NOTE — Telephone Encounter (Signed)
Critical Value Hgb 5.6 Dr Marin Olp notified. No orders at this time

## 2016-12-30 LAB — FERRITIN: Ferritin: 4 ng/ml — ABNORMAL LOW (ref 9–269)

## 2016-12-30 LAB — IRON AND TIBC
%SAT: 3 % — AB (ref 21–57)
Iron: 17 ug/dL — ABNORMAL LOW (ref 41–142)
TIBC: 481 ug/dL — ABNORMAL HIGH (ref 236–444)
UIBC: 465 ug/dL — AB (ref 120–384)

## 2016-12-30 LAB — RETICULOCYTES: RETICULOCYTE COUNT: 1.3 % (ref 0.6–2.6)

## 2016-12-31 MED FILL — OxyCONTIN 10 MG T12A: 10 | 30 days supply | Qty: 60 | Fill #0

## 2017-01-03 MED FILL — TEMAZEPAM 30 MG CAPSULE: 30 | 30 days supply | Qty: 30 | Fill #0

## 2017-01-06 ENCOUNTER — Telehealth: Payer: Self-pay | Admitting: *Deleted

## 2017-01-06 ENCOUNTER — Other Ambulatory Visit (HOSPITAL_BASED_OUTPATIENT_CLINIC_OR_DEPARTMENT_OTHER): Payer: Medicare Other

## 2017-01-06 ENCOUNTER — Other Ambulatory Visit: Payer: Self-pay | Admitting: *Deleted

## 2017-01-06 DIAGNOSIS — D45 Polycythemia vera: Secondary | ICD-10-CM | POA: Diagnosis not present

## 2017-01-06 LAB — CBC WITH DIFFERENTIAL (CANCER CENTER ONLY)
BASO#: 0 10*3/uL (ref 0.0–0.2)
BASO%: 0.2 % (ref 0.0–2.0)
EOS ABS: 0.3 10*3/uL (ref 0.0–0.5)
EOS%: 7 % (ref 0.0–7.0)
HEMATOCRIT: 22.1 % — AB (ref 34.8–46.6)
HEMOGLOBIN: 5.4 g/dL — AB (ref 11.6–15.9)
LYMPH#: 0.7 10*3/uL — AB (ref 0.9–3.3)
LYMPH%: 15.2 % (ref 14.0–48.0)
MCH: 17.1 pg — AB (ref 26.0–34.0)
MCHC: 24.4 g/dL — ABNORMAL LOW (ref 32.0–36.0)
MCV: 70 fL — ABNORMAL LOW (ref 81–101)
MONO#: 0.6 10*3/uL (ref 0.1–0.9)
MONO%: 12.4 % (ref 0.0–13.0)
NEUT%: 65.2 % (ref 39.6–80.0)
NEUTROS ABS: 2.9 10*3/uL (ref 1.5–6.5)
Platelets: 119 10*3/uL — ABNORMAL LOW (ref 145–400)
RBC: 3.16 10*6/uL — AB (ref 3.70–5.32)
RDW: 20.6 % — ABNORMAL HIGH (ref 11.1–15.7)
WBC: 4.4 10*3/uL (ref 3.9–10.0)

## 2017-01-06 LAB — TECHNOLOGIST REVIEW CHCC SATELLITE

## 2017-01-06 MED FILL — CYCLOBENZAPRINE 10 MG TAB: 10 | 15 days supply | Qty: 60 | Fill #1

## 2017-01-06 NOTE — Telephone Encounter (Signed)
Critical Value Hgb 5.4 Dr Ennever notified. No orders at this time. 

## 2017-01-11 ENCOUNTER — Other Ambulatory Visit: Payer: Self-pay | Admitting: *Deleted

## 2017-01-11 DIAGNOSIS — D45 Polycythemia vera: Secondary | ICD-10-CM

## 2017-01-12 ENCOUNTER — Other Ambulatory Visit: Payer: Self-pay | Admitting: Hematology & Oncology

## 2017-01-12 ENCOUNTER — Telehealth: Payer: Self-pay | Admitting: *Deleted

## 2017-01-12 ENCOUNTER — Other Ambulatory Visit (HOSPITAL_BASED_OUTPATIENT_CLINIC_OR_DEPARTMENT_OTHER): Payer: Medicare Other

## 2017-01-12 DIAGNOSIS — D45 Polycythemia vera: Secondary | ICD-10-CM

## 2017-01-12 LAB — CBC WITH DIFFERENTIAL (CANCER CENTER ONLY)
BASO#: 0 10*3/uL (ref 0.0–0.2)
BASO%: 0.2 % (ref 0.0–2.0)
EOS%: 12 % — AB (ref 0.0–7.0)
Eosinophils Absolute: 0.6 10*3/uL — ABNORMAL HIGH (ref 0.0–0.5)
HCT: 23.1 % — ABNORMAL LOW (ref 34.8–46.6)
HGB: 5.7 g/dL — CL (ref 11.6–15.9)
LYMPH#: 0.8 10*3/uL — ABNORMAL LOW (ref 0.9–3.3)
LYMPH%: 16.7 % (ref 14.0–48.0)
MCH: 17.2 pg — ABNORMAL LOW (ref 26.0–34.0)
MCHC: 24.7 g/dL — ABNORMAL LOW (ref 32.0–36.0)
MCV: 70 fL — AB (ref 81–101)
MONO#: 0.6 10*3/uL (ref 0.1–0.9)
MONO%: 13.2 % — ABNORMAL HIGH (ref 0.0–13.0)
NEUT#: 2.8 10*3/uL (ref 1.5–6.5)
NEUT%: 57.9 % (ref 39.6–80.0)
PLATELETS: 135 10*3/uL — AB (ref 145–400)
RBC: 3.32 10*6/uL — ABNORMAL LOW (ref 3.70–5.32)
RDW: 20.9 % — AB (ref 11.1–15.7)
WBC: 4.9 10*3/uL (ref 3.9–10.0)

## 2017-01-12 NOTE — Telephone Encounter (Signed)
Critical Value Hgb 5.7 Dr Ennever notified. No orders at this time 

## 2017-01-20 ENCOUNTER — Ambulatory Visit (INDEPENDENT_AMBULATORY_CARE_PROVIDER_SITE_OTHER): Payer: Medicare Other | Admitting: Family

## 2017-01-20 ENCOUNTER — Encounter (HOSPITAL_COMMUNITY): Payer: Medicare Other

## 2017-01-20 ENCOUNTER — Ambulatory Visit (HOSPITAL_COMMUNITY)
Admission: RE | Admit: 2017-01-20 | Discharge: 2017-01-20 | Disposition: A | Discharge status: Home / Self Care | Payer: Medicare Other | Source: Ambulatory Visit | Attending: Family | Admitting: Family

## 2017-01-20 ENCOUNTER — Encounter: Payer: Self-pay | Admitting: Family

## 2017-01-20 ENCOUNTER — Ambulatory Visit: Payer: Medicare Other | Admitting: Family

## 2017-01-20 VITALS — BP 113/59 | HR 68 | Temp 97.0°F | Resp 16 | Ht 63.0 in | Wt 162.2 lb

## 2017-01-20 DIAGNOSIS — I6523 Occlusion and stenosis of bilateral carotid arteries: Secondary | ICD-10-CM | POA: Diagnosis not present

## 2017-01-20 DIAGNOSIS — Z9889 Other specified postprocedural states: Secondary | ICD-10-CM | POA: Insufficient documentation

## 2017-01-20 LAB — VAS US CAROTID
LCCADSYS: 99 cm/s
LCCAPDIAS: 28 cm/s
LCCAPSYS: 140 cm/s
LEFT ECA DIAS: -19 cm/s
LICAPDIAS: -12 cm/s
Left CCA dist dias: 14 cm/s
Left ICA dist dias: -32 cm/s
Left ICA dist sys: -99 cm/s
Left ICA prox sys: -63 cm/s
RCCADSYS: -126 cm/s
RCCAPDIAS: -20 cm/s
RCCAPSYS: -130 cm/s
RIGHT CCA MID DIAS: 22 cm/s
RIGHT ECA DIAS: -16 cm/s
RIGHT VERTEBRAL DIAS: 16 cm/s

## 2017-01-20 NOTE — Progress Notes (Signed)
Chief Complaint: Follow up Extracranial Carotid Artery Stenosis   History of Present Illness  MAHAILA TISCHER is a 71 y.o. female returns for followup after left carotid endarterectomy on October 10, 2012 by Dr. Oneida Alar. This was done with a saphenous vein patch. She had a seizure several days after discharge from the hospital. She was readmitted [facility] hospital and placed on Keppra. She has recovered since then with no further seizures. She still has some weakness and clumsiness of her right hand.  She states her memory has improved since the left CEA.  Had a stroke in 2001, had a clot that went to left eye and damaged the retina, has some peripheral vision in left eye.  Had a second stroke July 7 or 9, 2014, then had the left CEA., she has had no further stroke or TIA activity.  She states that her blood pressure gets high at times, she takes her blood pressure medication when her pressure is high, as directed by her cardiologist pt reports. She gets phlebotomized when needed, has polycythemia vera. She also has fibromyalgia.   Has mild right sided weakness since the second stroke.  She has no expressive aphasia.   Pt Diabetic: No Pt smoker: non-smoker  Pt meds include:  Statin : no  ASA: Yes  Other anticoagulants/antiplatelets: Plavix   Past Medical History:  Diagnosis Date  . Acute cholecystitis 10/07/2012   s/p pec drain due to recent CVA, pending chole 11/2012  . Anemia    "due to polycythemia vera" (12/28/2012)  . Anxiety   . Aortic valve sclerosis    by echocardiogram 12/04/2009  . Asthma    "wheeze occasionally; I don't have asthma" (12/28/2012)  . Chronic back pain   . Chronic constipation    takes Mineral Oil,Juice,Enema(prn),and Miralax(Prn) and Cascara nightly  . 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 CVA (cerebral infarction) 10-05-12   rHP, improved, complicated by sz event x 1  . Difficult intubation    grade 3 airway  . Exertional  dyspnea   . Fibromyalgia    chronic pain syndrome  . GERD (gastroesophageal reflux disease)   . Heart murmur    "flow mumur" (12/28/2012)  . History of blood transfusion 09/2012; 12/28/2012  . Hyperlipidemia    "not since carotid OR" (12/28/2012)  . Hypertension    "not since carotid OR" (12/28/2012)  . Hypothyroidism   . Insomnia    takes restoril nightly and Xanax  . 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 Occlusion and stenosis of carotid artery without mention of cerebral infarction 10/26/2012  . PAC (premature atrial contraction)   . 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 PONV (postoperative nausea and vomiting)    "I vomit for 5 days straight w/certain RX w/thanes" (12/28/2012)  . Postmenopausal state    on hormone replacement therapy  . Seasonal allergies    takes Allegra in am and Benadryl at night  . Seizures ([place])    only seizure was 10/14/12 "after carotid endarterectomy";takes Keppra daily  . Stroke [facility]) 09/2012   "after they put drain in my gallbladder"; residual is "haven't felt well enough to tell since stroke to tell; weak already; weaker when I'm tired" (12/28/2012)    Social History Social History  Substance Use Topics  . Smoking status: Never Smoker  . Smokeless tobacco: Never Used     Comment: never used tobacco  . Alcohol use No    Family History Family  History  Problem Relation Age of Onset  . Heart attack Father   . Heart disease Father        Heart Disease before age 71  . Coronary artery disease Mother        had aortic valve replacement  . Heart disease Mother   . Heart attack Mother   . Hyperlipidemia Sister   . Hypertension Sister     Surgical History Past Surgical History:  Procedure Laterality Date  . ABDOMINAL HYSTERECTOMY  1996  . CAROTID ENDARTERECTOMY Left 10-10-12  . CHOLECYSTECTOMY N/A 11/29/2012   Procedure: ATTEMPTED LAPAROSCOPIC CHOLECYSTECTOMY ;  Surgeon: Gwenyth Ober, MD;  Location: [place];  Service: General;   Laterality: N/A;  . CHOLECYSTECTOMY N/A 11/29/2012   Procedure: CHOLECYSTECTOMY, COMMON BILE DUCT EXPLORATION, T-TUBE PLACEMENT;  Surgeon: Gwenyth Ober, MD;  Location: [place];  Service: General;  Laterality: N/A;  . COLONOSCOPY    . COLONOSCOPY N/A 05/14/2013   Procedure: COLONOSCOPY;  Surgeon: Inda Castle, MD;  Location: [facility];  Service: Endoscopy;  Laterality: N/A;  . ENDARTERECTOMY Left 10/10/2012   Procedure: ENDARTERECTOMY CAROTID- LEFT NECK WITH GREATER SAPHENOUS VEIN PATCH LEFT LEG;  Surgeon: Elam Dutch, MD;  Location: [facility] OR;  Service: Vascular;  Laterality: Left;  . ESOPHAGOGASTRODUODENOSCOPY N/A 01/02/2013   Procedure: ESOPHAGOGASTRODUODENOSCOPY (EGD);  Surgeon: Inda Castle, MD;  Location: [place];  Service: Endoscopy;  Laterality: N/A;  . insertion of drain  10/02/12   right low abdomen and draining pus  . NASAL SEPTUM SURGERY     late 70's  . PICC line placed     10/15/12  . TONSILLECTOMY     age 25  . TUBAL LIGATION  ~ 1972    Allergies  Allergen Reactions  . Ethrane [Enflurane] Nausea And Vomiting  . Codeine Nausea Only  . Lexapro [Escitalopram Oxalate]   . Synthroid [Levothyroxine Sodium] Other (See Comments)    Not effective. Causes excessive sleepiness. Takes armour thyroid  . Calcium Channel Blockers Swelling, Palpitations and Hypertension  . Penicillins Rash    Current Outpatient Prescriptions  Medication Sig Dispense Refill  . ALPRAZolam (XANAX) 0.5 MG tablet TAKE 1 TABLET BY MOUTH TWICE DAILY 60 tablet 0  . ARMOUR THYROID 120 MG tablet TAKE 1 TABLET (120 MG TOTAL) BY MOUTH DAILY BEFORE BREAKFAST. 90 tablet 2  . ARMOUR THYROID 30 MG tablet Take 1 tablet (30 mg total) by mouth daily. 90 tablet 0  . aspirin 325 MG tablet Take 325 mg by mouth daily after breakfast. "to thin  Blood"    . bisoprolol (ZEBETA) 10 MG tablet TAKE 1 TABLET EVERY DAY 90 tablet 2  . Cascara [place] 450 MG CAPS Take 900 mg by mouth at bedtime.    . ciprofloxacin  (CIPRO) 500 MG tablet TAKE 1 TABLET BY MOUTH TWICE A DAY FOR 14 DAYS 28 tablet 2  . clindamycin (CLEOCIN) 300 MG capsule Take 2 capsules by mouth as directed.    . clopidogrel (PLAVIX) 75 MG tablet TAKE 1 TABLET (75 MG TOTAL) BY MOUTH DAILY. 30 tablet 3  . cyclobenzaprine (FLEXERIL) 10 MG tablet Take 0.5-1 tablets (5-10 mg total) by mouth every 6 (six) hours as needed for muscle spasms. 60 tablet 5  . diphenhydrAMINE (BENADRYL) 25 MG tablet Take 50 mg by mouth every 8 (eight) hours as needed. Patient may take 50 mg three times a day as needed for allergies    . docusate sodium (COLACE) 100 MG capsule Take 100 mg  by mouth 3 (three) times daily with meals.     . famotidine (PEPCID) 20 MG tablet TAKE 1 TABLET BY MOUTH TWICE A DAY BEFORE A MEAL *4/29* 180 tablet 3  . fexofenadine (ALLEGRA) 180 MG tablet Take 180 mg by mouth daily after breakfast.     . furosemide (LASIX) 40 MG tablet TAKE 1 TABLET BY MOUTH TWICE DAILY 180 tablet 2  . Garlic Oil 8119 MG CAPS Take 1,000 mg by mouth daily after breakfast. Reported on 06/03/2015    . guaiFENesin (MUCINEX) 600 MG 12 hr tablet Take 400 mg by mouth daily.     . hydrochlorothiazide (MICROZIDE) 12.5 MG capsule TAKE 1 CAPSULE BY MOUTH DAILY 90 capsule 3  . Ipratropium-Albuterol (COMBIVENT RESPIMAT) 20-100 MCG/ACT AERS respimat Inhale 2 puffs into the lungs 4 (four) times daily.    Marland Kitchen levETIRAcetam (KEPPRA) 250 MG tablet TAKE 1 TABLET TWICE A DAY 180 tablet 2  . lidocaine-prilocaine (EMLA) cream Apply 1 application topically as needed (to numb the phlebotomy site). 30 g 6  . lisinopril (PRINIVIL,ZESTRIL) 10 MG tablet Take 10 mg by mouth 2 (two) times daily as needed. Reported on 06/03/2015    . magnesium oxide (MAG-OX) 400 MG tablet Take 1,200 mg by mouth daily.    . mineral oil liquid Take 60 mLs by mouth at bedtime. With Juice    . Misc Natural Products (TART CHERRY ADVANCED PO) Take 1,200 mg by mouth 2 (two) times daily. Reported on 06/03/2015    . nitroGLYCERIN  (NITROSTAT) 0.4 MG SL tablet Place 0.4 mg under the tongue every 5 (five) minutes as needed for chest pain.    . NONFORMULARY OR COMPOUNDED ITEM Cmp, lipid, cbcd , , tsh.magnesium -- dx hypothyroidism, hx cva, hypokalemia, 1 each 0  . ondansetron (ZOFRAN) 4 MG tablet Take 4 mg by mouth every 8 (eight) hours as needed for nausea or vomiting.    Marland Kitchen oxyCODONE (OXYCONTIN) 10 mg 12 hr tablet Take 1 tablet (10 mg total) by mouth 2 (two) times daily. Take one tablet by mouth at 8am and one tablet by mouth at 4pm 60 tablet 0  . oxyCODONE (OXYCONTIN) 20 mg 12 hr tablet Take 1 tablet (20 mg total) by mouth 3 (three) times daily. Take 1 tablet by mouth at 12noon, 8pm and 4am 90 tablet 0  . oxyCODONE (ROXICODONE) 15 MG immediate release tablet Take 1 tablet (15 mg total) by mouth every 6 (six) hours as needed for pain. 90 tablet 0  . potassium chloride (K-DUR) 10 MEQ tablet TAKE 8 TABLETS BY MOUTH DAILY ([id number] USHER SMITH OR AMY MFT) 240 tablet 2  . promethazine (PHENERGAN) 25 MG tablet Take 25 mg by mouth every 6 (six) hours as needed for nausea.    . Sennosides (SENNA LAX PO) Take 1 tablet by mouth 3 (three) times daily after meals.     . temazepam (RESTORIL) 30 MG capsule TAKE 1 CAPSULE BY MOUTH AT BEDTIME AS NEEDED FOR SLEEP 30 capsule 0  . triamcinolone cream (KENALOG) 0.1 % Apply 1 application topically 2 (two) times daily as needed (to skin).    . Vitamin D, Cholecalciferol, 1000 UNITS TABS Take 4,000 Units by mouth every morning.     No current facility-administered medications for this visit.     Review of Systems : See HPI for pertinent positives and negatives.  Physical Examination  Vitals:   01/20/17 1337 01/20/17 1341  BP: 108/63 (!) 113/59  Pulse: 68   Resp: 16  Temp: (!) 97 F (36.1 C)   TempSrc: Oral   SpO2: 100%   Weight: 162 lb 3.2 oz (73.6 kg)   Height: 5\' 3"  (1.6 m)    Body mass index is 28.73 kg/m.  General: WDWN female in NAD  GAIT:using rolling walker Eyes:  PERRLA  Pulmonary: Respirations are non-labored, CTAB, no rales, rhonchi, or wheezing. Decreased air movement in right base.  Cardiac: regular rhythm and rate, positive murmur.   VASCULAR EXAM Carotid Bruits Left Right   Transmitted cardiac murmur positive  Aorta is not palpable.  Radial pulses are 1+ palpable and equal.   LE Pulses  LEFT  RIGHT   POPLITEAL  not palpable  not palpable  POSTERIOR TIBIAL  1+ palpable  Faintly palpable   DORSALIS PEDIS ANTERIOR TIBIAL  1+ palpable  Faintly palpable    Gastrointestinal: no tenderness, no masses palpated.  Musculoskeletal: No muscle atrophy/wasting. M/S 4/5 throughout, Extremities without ischemic changes. 1+ non pitting edema in both ankles and feet, is wearing knee high compression hose. Neurologic: A&O X 3; appropriate affect, speech is normal, CN 2-12 except mildly slow expression of speech, Pain and light touch intact in extremities, Motor exam as listed above    Assessment: AZHIA SIEFKEN is a 71 y.o. female who is s/p left carotid endarterectomy on October 10, 2012. She had a stroke in 2001, an embolus that went to left eye and damaged the retina, has some peripheral vision in left eye.  She had a second stroke July 7 or 9, 2014, then had the left CEA., she has had no subsequent stroke or TIA activity.  DATA Today's carotid Duplex suggests 40 -59% stenosis in the right internal carotid artery, with no plaque visualizedand a patent left carotid endarterectomy site with no evidence of restenosis.  Bilateral vertebral artery flow is antegrade.  Bilateral subclavian artery waveforms are normal.  No significant change since prior exams of 11/14/14 and 01/15/16.   Plan: Follow-up in 1 year with Carotid Duplex scan.    I discussed in depth with the patient the nature of atherosclerosis, and emphasized the importance of maximal medical management including strict control of blood pressure, blood  glucose, and lipid levels, obtaining regular exercise, and continued cessation of smoking.  The patient is aware that without maximal medical management the underlying atherosclerotic disease process will progress, limiting the benefit of any interventions. The patient was given information about stroke prevention and what symptoms should prompt the patient to seek immediate medical care. Thank you for allowing [place] to participate in this patient's care.  Clemon Chambers, RN, MSN, FNP-C [facility] Office: [place]: [place]  01/20/17 1:45 PM

## 2017-01-20 NOTE — Patient Instructions (Signed)
Stroke Prevention Some medical conditions and behaviors are associated with an increased chance of having a stroke. You may prevent a stroke by making healthy choices and managing medical conditions. How can I reduce my risk of having a stroke?  Stay physically active. Get at least 30 minutes of activity on most or all days.  Do not smoke. It may also be helpful to avoid exposure to secondhand smoke.  Limit alcohol use. Moderate alcohol use is considered to be:  No more than 2 drinks per day for men.  No more than 1 drink per day for nonpregnant women.  Eat healthy foods. This involves:  Eating 5 or more servings of fruits and vegetables a day.  Making dietary changes that address high blood pressure (hypertension), high cholesterol, diabetes, or obesity.  Manage your cholesterol levels.  Making food choices that are high in fiber and low in saturated fat, trans fat, and cholesterol may control cholesterol levels.  Take any prescribed medicines to control cholesterol as directed by your health care provider.  Manage your diabetes.  Controlling your carbohydrate and sugar intake is recommended to manage diabetes.  Take any prescribed medicines to control diabetes as directed by your health care provider.  Control your hypertension.  Making food choices that are low in salt (sodium), saturated fat, trans fat, and cholesterol is recommended to manage hypertension.  Ask your health care provider if you need treatment to lower your blood pressure. Take any prescribed medicines to control hypertension as directed by your health care provider.  If you are 18-39 years of age, have your blood pressure checked every 3-5 years. If you are 40 years of age or older, have your blood pressure checked every year.  Maintain a healthy weight.  Reducing calorie intake and making food choices that are low in sodium, saturated fat, trans fat, and cholesterol are recommended to manage  weight.  Stop drug abuse.  Avoid taking birth control pills.  Talk to your health care provider about the risks of taking birth control pills if you are over 35 years old, smoke, get migraines, or have ever had a blood clot.  Get evaluated for sleep disorders (sleep apnea).  Talk to your health care provider about getting a sleep evaluation if you snore a lot or have excessive sleepiness.  Take medicines only as directed by your health care provider.  For some people, aspirin or blood thinners (anticoagulants) are helpful in reducing the risk of forming abnormal blood clots that can lead to stroke. If you have the irregular heart rhythm of atrial fibrillation, you should be on a blood thinner unless there is a good reason you cannot take them.  Understand all your medicine instructions.  Make sure that other conditions (such as anemia or atherosclerosis) are addressed. Get help right away if:  You have sudden weakness or numbness of the face, arm, or leg, especially on one side of the body.  Your face or eyelid droops to one side.  You have sudden confusion.  You have trouble speaking (aphasia) or understanding.  You have sudden trouble seeing in one or both eyes.  You have sudden trouble walking.  You have dizziness.  You have a loss of balance or coordination.  You have a sudden, severe headache with no known cause.  You have new chest pain or an irregular heartbeat. Any of these symptoms may represent a serious problem that is an emergency. Do not wait to see if the symptoms will go away.   Get medical help at once. Call your local emergency services (911 in [place] Do not drive yourself to the hospital. This information is not intended to replace advice given to you by your health care provider. Make sure you discuss any questions you have with your health care provider. Document Released: 04/22/2004 Document Revised: 08/21/2015 Document Reviewed: 09/15/2012 Elsevier  Interactive Patient Education  2017 [employer] Inc.  

## 2017-01-24 ENCOUNTER — Other Ambulatory Visit: Payer: Self-pay | Admitting: *Deleted

## 2017-01-24 DIAGNOSIS — M62838 Other muscle spasm: Secondary | ICD-10-CM

## 2017-01-24 DIAGNOSIS — I358 Other nonrheumatic aortic valve disorders: Secondary | ICD-10-CM

## 2017-01-24 DIAGNOSIS — M797 Fibromyalgia: Secondary | ICD-10-CM

## 2017-01-24 DIAGNOSIS — G4700 Insomnia, unspecified: Secondary | ICD-10-CM

## 2017-01-24 DIAGNOSIS — D45 Polycythemia vera: Secondary | ICD-10-CM

## 2017-01-24 MED ORDER — OXYCODONE HCL ER 20 MG PO T12A: 20.0000 mg | EXTENDED_RELEASE_TABLET | Freq: Three times a day (TID) | ORAL | 0 refills | Status: DC

## 2017-01-24 MED ORDER — OXYCODONE HCL 15 MG PO TABS
15.0000 mg | ORAL_TABLET | Freq: Four times a day (QID) | ORAL | 0 refills | Status: DC | PRN
Start: 2017-01-24 — End: 2017-02-21

## 2017-01-24 MED ORDER — OXYCODONE HCL ER 10 MG PO T12A: 10.0000 mg | EXTENDED_RELEASE_TABLET | Freq: Two times a day (BID) | ORAL | 0 refills | Status: DC

## 2017-01-25 ENCOUNTER — Other Ambulatory Visit: Payer: Self-pay | Admitting: *Deleted

## 2017-01-25 DIAGNOSIS — D45 Polycythemia vera: Secondary | ICD-10-CM

## 2017-01-26 ENCOUNTER — Other Ambulatory Visit: Payer: Self-pay | Admitting: Hematology & Oncology

## 2017-01-26 ENCOUNTER — Ambulatory Visit (HOSPITAL_BASED_OUTPATIENT_CLINIC_OR_DEPARTMENT_OTHER): Payer: Medicare Other

## 2017-01-26 ENCOUNTER — Other Ambulatory Visit (HOSPITAL_BASED_OUTPATIENT_CLINIC_OR_DEPARTMENT_OTHER): Payer: Medicare Other

## 2017-01-26 ENCOUNTER — Telehealth: Payer: Self-pay | Admitting: *Deleted

## 2017-01-26 VITALS — BP 103/53 | HR 70 | Temp 98.1°F | Resp 20

## 2017-01-26 DIAGNOSIS — D45 Polycythemia vera: Secondary | ICD-10-CM | POA: Diagnosis present

## 2017-01-26 DIAGNOSIS — I358 Other nonrheumatic aortic valve disorders: Secondary | ICD-10-CM

## 2017-01-26 DIAGNOSIS — M797 Fibromyalgia: Secondary | ICD-10-CM

## 2017-01-26 DIAGNOSIS — G4700 Insomnia, unspecified: Secondary | ICD-10-CM

## 2017-01-26 DIAGNOSIS — M62838 Other muscle spasm: Secondary | ICD-10-CM

## 2017-01-26 LAB — TECHNOLOGIST REVIEW CHCC SATELLITE

## 2017-01-26 LAB — CBC WITH DIFFERENTIAL (CANCER CENTER ONLY)
BASO#: 0 10*3/uL (ref 0.0–0.2)
BASO%: 0.4 % (ref 0.0–2.0)
EOS%: 6.3 % (ref 0.0–7.0)
Eosinophils Absolute: 0.3 10*3/uL (ref 0.0–0.5)
HCT: 24.5 % — ABNORMAL LOW (ref 34.8–46.6)
HGB: 6 g/dL — CL (ref 11.6–15.9)
LYMPH#: 0.7 10*3/uL — ABNORMAL LOW (ref 0.9–3.3)
LYMPH%: 14.5 % (ref 14.0–48.0)
MCH: 16.7 pg — ABNORMAL LOW (ref 26.0–34.0)
MCHC: 24.5 g/dL — ABNORMAL LOW (ref 32.0–36.0)
MCV: 68 fL — AB (ref 81–101)
MONO#: 0.6 10*3/uL (ref 0.1–0.9)
MONO%: 12.1 % (ref 0.0–13.0)
NEUT#: 3.1 10*3/uL (ref 1.5–6.5)
NEUT%: 66.7 % (ref 39.6–80.0)
PLATELETS: 126 10*3/uL — AB (ref 145–400)
RBC: 3.59 10*6/uL — ABNORMAL LOW (ref 3.70–5.32)
RDW: 21.5 % — AB (ref 11.1–15.7)
WBC: 4.6 10*3/uL (ref 3.9–10.0)

## 2017-01-26 NOTE — Progress Notes (Signed)
1 unit Phlebotomy performed over 12 minutes using a 16 gauge phlebotomy set. Patient tolerated well. Nourishment provided.

## 2017-01-26 NOTE — Progress Notes (Signed)
[name] presents today for phlebotomy per MD orders. Phlebotomy procedure started at 1440 and ended at 1450. 500 grams removed. Patient observed for 30 minutes after procedure without any incident. Patient tolerated procedure well. IV needle removed intact.

## 2017-01-26 NOTE — Telephone Encounter (Signed)
Critical Value Hgb 6.0 Dr Marin Olp notified. Patient scheduled for phlebotomy

## 2017-01-26 NOTE — Patient Instructions (Signed)
Therapeutic Phlebotomy Therapeutic phlebotomy is the controlled removal of blood from a person's body for the purpose of treating a medical condition. The procedure is similar to donating blood. Usually, about a pint (470 mL, or 0.47L) of blood is removed. The average adult has 9-12 pints (4.3-5.7 L) of blood. Therapeutic phlebotomy may be used to treat the following medical conditions:  Hemochromatosis. This is a condition in which the blood contains too much iron.  Polycythemia vera. This is a condition in which the blood contains too many red blood cells.  Porphyria cutanea tarda. This is a disease in which an important part of hemoglobin is not made properly. It results in the buildup of abnormal amounts of porphyrins in the body.  Sickle cell disease. This is a condition in which the red blood cells form an abnormal crescent shape rather than a round shape.  Tell a health care provider about:  Any allergies you have.  All medicines you are taking, including vitamins, herbs, eye drops, creams, and over-the-counter medicines.  Any problems you or family members have had with anesthetic medicines.  Any blood disorders you have.  Any surgeries you have had.  Any medical conditions you have. What are the risks? Generally, this is a safe procedure. However, problems may occur, including:  Nausea or light-headedness.  Low blood pressure.  Soreness, bleeding, swelling, or bruising at the needle insertion site.  Infection.  What happens before the procedure?  Follow instructions from your health care provider about eating or drinking restrictions.  Ask your health care provider about changing or stopping your regular medicines. This is especially important if you are taking diabetes medicines or blood thinners.  Wear clothing with sleeves that can be raised above the elbow.  Plan to have someone take you home after the procedure.  You may have a blood sample taken. What  happens during the procedure?  A needle will be inserted into one of your veins.  Tubing and a collection bag will be attached to that needle.  Blood will flow through the needle and tubing into the collection bag.  You may be asked to open and close your hand slowly and continually during the entire collection.  After the specified amount of blood has been removed from your body, the collection bag and tubing will be clamped.  The needle will be removed from your vein.  Pressure will be held on the site of the needle insertion to stop the bleeding.  A bandage (dressing) will be placed over the needle insertion site. The procedure may vary among health care providers and hospitals. What happens after the procedure?  Your recovery will be assessed and monitored.  You can return to your normal activities as directed by your health care provider. This information is not intended to replace advice given to you by your health care provider. Make sure you discuss any questions you have with your health care provider. Document Released: 08/17/2010 Document Revised: 11/15/2015 Document Reviewed: 03/11/2014 Elsevier Interactive Patient Education  2018 [employer]  

## 2017-01-28 MED FILL — OxyCONTIN 10 MG T12A: 10 | 30 days supply | Qty: 60 | Fill #0

## 2017-02-02 MED FILL — TEMAZEPAM 30 MG CAPSULE: 30 | 30 days supply | Qty: 30 | Fill #0

## 2017-02-02 MED FILL — ALPRAZolam 0.5 MG TABS: 0.5 | 30 days supply | Qty: 60 | Fill #1

## 2017-02-08 ENCOUNTER — Other Ambulatory Visit: Payer: Self-pay | Admitting: *Deleted

## 2017-02-08 DIAGNOSIS — D45 Polycythemia vera: Secondary | ICD-10-CM

## 2017-02-09 ENCOUNTER — Telehealth: Payer: Self-pay | Admitting: *Deleted

## 2017-02-09 ENCOUNTER — Other Ambulatory Visit (HOSPITAL_BASED_OUTPATIENT_CLINIC_OR_DEPARTMENT_OTHER): Payer: Medicare Other

## 2017-02-09 DIAGNOSIS — D45 Polycythemia vera: Secondary | ICD-10-CM | POA: Diagnosis present

## 2017-02-09 LAB — CBC WITH DIFFERENTIAL (CANCER CENTER ONLY)
BASO#: 0 10*3/uL (ref 0.0–0.2)
BASO%: 0.2 % (ref 0.0–2.0)
EOS ABS: 0.2 10*3/uL (ref 0.0–0.5)
EOS%: 5.2 % (ref 0.0–7.0)
HCT: 22.1 % — ABNORMAL LOW (ref 34.8–46.6)
HEMOGLOBIN: 5.5 g/dL — AB (ref 11.6–15.9)
LYMPH#: 0.8 10*3/uL — ABNORMAL LOW (ref 0.9–3.3)
LYMPH%: 17.8 % (ref 14.0–48.0)
MCH: 17.1 pg — ABNORMAL LOW (ref 26.0–34.0)
MCHC: 24.9 g/dL — ABNORMAL LOW (ref 32.0–36.0)
MCV: 69 fL — AB (ref 81–101)
MONO#: 0.6 10*3/uL (ref 0.1–0.9)
MONO%: 13.5 % — ABNORMAL HIGH (ref 0.0–13.0)
NEUT%: 63.3 % (ref 39.6–80.0)
NEUTROS ABS: 2.8 10*3/uL (ref 1.5–6.5)
Platelets: 133 10*3/uL — ABNORMAL LOW (ref 145–400)
RBC: 3.22 10*6/uL — AB (ref 3.70–5.32)
RDW: 21.2 % — ABNORMAL HIGH (ref 11.1–15.7)
WBC: 4.4 10*3/uL (ref 3.9–10.0)

## 2017-02-09 LAB — CMP (CANCER CENTER ONLY)
ALBUMIN: 3.3 g/dL (ref 3.3–5.5)
ALK PHOS: 96 U/L — AB (ref 26–84)
ALT(SGPT): 18 U/L (ref 10–47)
AST: 21 U/L (ref 11–38)
BUN, Bld: 8 mg/dL (ref 7–22)
CALCIUM: 8.9 mg/dL (ref 8.0–10.3)
CHLORIDE: 98 meq/L (ref 98–108)
CO2: 33 mEq/L (ref 18–33)
Creat: 1.1 mg/dl (ref 0.6–1.2)
Glucose, Bld: 112 mg/dL (ref 73–118)
POTASSIUM: 3.5 meq/L (ref 3.3–4.7)
Sodium: 142 mEq/L (ref 128–145)
TOTAL PROTEIN: 6.4 g/dL (ref 6.4–8.1)
Total Bilirubin: 0.8 mg/dl (ref 0.20–1.60)

## 2017-02-09 NOTE — Telephone Encounter (Signed)
Critical Value Hgb 5.5 Dr Marin Olp notified. No orders at this time

## 2017-02-21 ENCOUNTER — Other Ambulatory Visit: Payer: Self-pay | Admitting: *Deleted

## 2017-02-21 DIAGNOSIS — D45 Polycythemia vera: Secondary | ICD-10-CM

## 2017-02-21 DIAGNOSIS — G4700 Insomnia, unspecified: Secondary | ICD-10-CM

## 2017-02-21 DIAGNOSIS — I358 Other nonrheumatic aortic valve disorders: Secondary | ICD-10-CM

## 2017-02-21 DIAGNOSIS — M797 Fibromyalgia: Secondary | ICD-10-CM

## 2017-02-21 DIAGNOSIS — M62838 Other muscle spasm: Secondary | ICD-10-CM

## 2017-02-21 MED ORDER — OXYCODONE HCL ER 20 MG PO T12A: 20.0000 mg | EXTENDED_RELEASE_TABLET | Freq: Three times a day (TID) | ORAL | 0 refills | Status: DC

## 2017-02-21 MED ORDER — OXYCODONE HCL ER 10 MG PO T12A: 10.0000 mg | EXTENDED_RELEASE_TABLET | Freq: Two times a day (BID) | ORAL | 0 refills | Status: DC

## 2017-02-21 MED ORDER — OXYCODONE HCL 15 MG PO TABS: 15.0000 mg | ORAL_TABLET | Freq: Four times a day (QID) | ORAL | 0 refills | Status: DC | PRN

## 2017-02-23 ENCOUNTER — Other Ambulatory Visit (HOSPITAL_BASED_OUTPATIENT_CLINIC_OR_DEPARTMENT_OTHER): Payer: Medicare Other

## 2017-02-23 ENCOUNTER — Telehealth: Payer: Self-pay | Admitting: *Deleted

## 2017-02-23 ENCOUNTER — Other Ambulatory Visit: Payer: Self-pay

## 2017-02-23 ENCOUNTER — Encounter: Payer: Self-pay | Admitting: Hematology & Oncology

## 2017-02-23 ENCOUNTER — Ambulatory Visit (HOSPITAL_BASED_OUTPATIENT_CLINIC_OR_DEPARTMENT_OTHER): Payer: Medicare Other | Admitting: Hematology & Oncology

## 2017-02-23 ENCOUNTER — Ambulatory Visit (HOSPITAL_BASED_OUTPATIENT_CLINIC_OR_DEPARTMENT_OTHER): Payer: Medicare Other

## 2017-02-23 VITALS — BP 111/36 | HR 70 | Temp 97.7°F | Resp 18 | Wt 165.0 lb

## 2017-02-23 VITALS — BP 97/47 | HR 72 | Resp 20

## 2017-02-23 DIAGNOSIS — E611 Iron deficiency: Secondary | ICD-10-CM

## 2017-02-23 DIAGNOSIS — M797 Fibromyalgia: Secondary | ICD-10-CM | POA: Diagnosis not present

## 2017-02-23 DIAGNOSIS — Z8673 Personal history of transient ischemic attack (TIA), and cerebral infarction without residual deficits: Secondary | ICD-10-CM | POA: Diagnosis not present

## 2017-02-23 DIAGNOSIS — D45 Polycythemia vera: Secondary | ICD-10-CM

## 2017-02-23 LAB — CBC WITH DIFFERENTIAL (CANCER CENTER ONLY)
BASO#: 0 10*3/uL (ref 0.0–0.2)
BASO%: 0.5 % (ref 0.0–2.0)
EOS ABS: 0.3 10*3/uL (ref 0.0–0.5)
EOS%: 7.4 % — AB (ref 0.0–7.0)
HEMATOCRIT: 23.6 % — AB (ref 34.8–46.6)
HEMOGLOBIN: 5.8 g/dL — AB (ref 11.6–15.9)
LYMPH#: 0.7 10*3/uL — AB (ref 0.9–3.3)
LYMPH%: 17.1 % (ref 14.0–48.0)
MCH: 16.5 pg — AB (ref 26.0–34.0)
MCHC: 24.6 g/dL — AB (ref 32.0–36.0)
MCV: 67 fL — ABNORMAL LOW (ref 81–101)
MONO#: 0.5 10*3/uL (ref 0.1–0.9)
MONO%: 12.8 % (ref 0.0–13.0)
NEUT%: 62.2 % (ref 39.6–80.0)
NEUTROS ABS: 2.4 10*3/uL (ref 1.5–6.5)
Platelets: 120 10*3/uL — ABNORMAL LOW (ref 145–400)
RBC: 3.51 10*6/uL — ABNORMAL LOW (ref 3.70–5.32)
RDW: 21.3 % — AB (ref 11.1–15.7)
WBC: 3.9 10*3/uL (ref 3.9–10.0)

## 2017-02-23 LAB — CMP (CANCER CENTER ONLY)
ALBUMIN: 3.4 g/dL (ref 3.3–5.5)
ALT(SGPT): 15 U/L (ref 10–47)
AST: 24 U/L (ref 11–38)
Alkaline Phosphatase: 106 U/L — ABNORMAL HIGH (ref 26–84)
BUN: 10 mg/dL (ref 7–22)
CHLORIDE: 100 meq/L (ref 98–108)
CO2: 31 mEq/L (ref 18–33)
Calcium: 9.2 mg/dL (ref 8.0–10.3)
Creat: 0.8 mg/dl (ref 0.6–1.2)
Glucose, Bld: 114 mg/dL (ref 73–118)
POTASSIUM: 3.7 meq/L (ref 3.3–4.7)
Sodium: 142 mEq/L (ref 128–145)
TOTAL PROTEIN: 6.6 g/dL (ref 6.4–8.1)
Total Bilirubin: 0.8 mg/dl (ref 0.20–1.60)

## 2017-02-23 LAB — RETICULOCYTES: RETICULOCYTE COUNT: 1.3 % (ref 0.6–2.6)

## 2017-02-23 MED ORDER — METOLAZONE 2.5 MG PO TABS: ORAL_TABLET | ORAL | 3 refills | Status: DC

## 2017-02-23 NOTE — Telephone Encounter (Signed)
Critical Value Hgb 5.8 Dr Ennever notified. No orders at this time 

## 2017-02-23 NOTE — Patient Instructions (Signed)
Therapeutic Phlebotomy Therapeutic phlebotomy is the controlled removal of blood from a person's body for the purpose of treating a medical condition. The procedure is similar to donating blood. Usually, about a pint (470 mL, or 0.47L) of blood is removed. The average adult has 9-12 pints (4.3-5.7 L) of blood. Therapeutic phlebotomy may be used to treat the following medical conditions:  Hemochromatosis. This is a condition in which the blood contains too much iron.  Polycythemia vera. This is a condition in which the blood contains too many red blood cells.  Porphyria cutanea tarda. This is a disease in which an important part of hemoglobin is not made properly. It results in the buildup of abnormal amounts of porphyrins in the body.  Sickle cell disease. This is a condition in which the red blood cells form an abnormal crescent shape rather than a round shape.  Tell a health care provider about:  Any allergies you have.  All medicines you are taking, including vitamins, herbs, eye drops, creams, and over-the-counter medicines.  Any problems you or family members have had with anesthetic medicines.  Any blood disorders you have.  Any surgeries you have had.  Any medical conditions you have. What are the risks? Generally, this is a safe procedure. However, problems may occur, including:  Nausea or light-headedness.  Low blood pressure.  Soreness, bleeding, swelling, or bruising at the needle insertion site.  Infection.  What happens before the procedure?  Follow instructions from your health care provider about eating or drinking restrictions.  Ask your health care provider about changing or stopping your regular medicines. This is especially important if you are taking diabetes medicines or blood thinners.  Wear clothing with sleeves that can be raised above the elbow.  Plan to have someone take you home after the procedure.  You may have a blood sample taken. What  happens during the procedure?  A needle will be inserted into one of your veins.  Tubing and a collection bag will be attached to that needle.  Blood will flow through the needle and tubing into the collection bag.  You may be asked to open and close your hand slowly and continually during the entire collection.  After the specified amount of blood has been removed from your body, the collection bag and tubing will be clamped.  The needle will be removed from your vein.  Pressure will be held on the site of the needle insertion to stop the bleeding.  A bandage (dressing) will be placed over the needle insertion site. The procedure may vary among health care providers and hospitals. What happens after the procedure?  Your recovery will be assessed and monitored.  You can return to your normal activities as directed by your health care provider. This information is not intended to replace advice given to you by your health care provider. Make sure you discuss any questions you have with your health care provider. Document Released: 08/17/2010 Document Revised: 11/15/2015 Document Reviewed: 03/11/2014 Elsevier Interactive Patient Education  2018 [employer]  

## 2017-02-23 NOTE — Progress Notes (Signed)
Hematology and Oncology Follow Up Visit  [name] [id number] 1945/12/21 71 y.o. 09/08/2016   Principle Diagnosis:  Polycythemia vera -- hyperviscosity variant. 2. 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  Current Therapy:    Phlebotomy to maintain hemoglobin below 6.0     Interim History:  [name] is back for followup. She is doing okay. She and her husband live up in [place].  He will have a partial nephrectomy in July because of a tumor found on his right kidney.  This would be done at [facility].    He did have a nice Thanksgiving.  There were expanded with their neighbors.    She is feeling tired.  She is having more swelling in her legs.  This typically happens when she gets a little bit hyperviscous.    She is definitely iron deficient.  Her ferritin is 4 with an iron saturation less than 5%.   She is having no cough.  She does have bad fibromyalgia.   She is worried about her memory.  Again this can happen when her blood gets slightly thick.  There is also, she feels, might be from the fibromyalgia.  Personally, I think this might be from all the medications that she takes.    She has had no fever.  She has had no nausea or vomiting.  She has had no visual disturbances.  She does have occasional headaches.    Overall, her performance status of ECOG 1.    Medications:Allergies:  Allergies  Allergen Reactions  . Ethrane [Enflurane] Nausea And Vomiting  . Codeine Nausea Only  . Synthroid [Levothyroxine Sodium] Other (See Comments)    Not effective. Causes excessive sleepiness. Takes armour thyroid  . Calcium Channel Blockers Swelling, Palpitations and Hypertension  . Penicillins Rash    Past Medical History, Surgical history, Social history, and Family History were reviewed and updated.  Review of Systems: As stated in the interim history  Physical Exam:  weight is 162 lb (73.5 kg). Her oral temperature is 98.4  F (36.9 C). Her blood pressure is 138/50 (abnormal) and her pulse is 79. Her respiration is 20 and oxygen saturation is 100%.   Physical Exam  Constitutional: She is oriented to person, place, and time.  HENT:  Head: Normocephalic and atraumatic.  Mouth/Throat: Oropharynx is clear and moist.  Eyes: EOM are normal. Pupils are equal, round, and reactive to light.  Neck: Normal range of motion.  Cardiovascular: Normal rate, regular rhythm and normal heart sounds.  Pulmonary/Chest: Effort normal and breath sounds normal.  Abdominal: Soft. Bowel sounds are normal.  Musculoskeletal: Normal range of motion. She exhibits no edema, tenderness or deformity.  Lymphadenopathy:    She has no cervical adenopathy.  Neurological: She is alert and oriented to person, place, and time.  Skin: Skin is warm and dry. No rash noted. No erythema.  Psychiatric: She has a normal mood and affect. Her behavior is normal. Judgment and thought content normal.  Vitals reviewed.  .  Lab Results  Component Value Date   WBC 5.5 09/08/2016   HGB 6.0 (LL) 09/08/2016   HCT 24.6 (L) 09/08/2016   MCV 70 (L) 09/08/2016   PLT 167 Large & giant platelets 09/08/2016     Chemistry      Component Value Date/Time   NA 140 09/08/2016 1336   NA 138 03/24/2016 1306   K 3.4 09/08/2016 1336   K 3.3 (L) 03/24/2016 1306  CL 102 09/08/2016 1336   CO2 30 09/08/2016 1336   CO2 31 (H) 03/24/2016 1306   BUN 9 09/08/2016 1336   BUN 8.2 03/24/2016 1306   CREATININE 0.9 09/08/2016 1336   CREATININE 0.8 03/24/2016 1306      Component Value Date/Time   CALCIUM 8.9 09/08/2016 1336   CALCIUM 9.2 03/24/2016 1306   ALKPHOS 104 (H) 09/08/2016 1336   ALKPHOS 114 03/24/2016 1306   AST 24 09/08/2016 1336   AST 15 03/24/2016 1306   ALT 19 09/08/2016 1336   ALT 7 03/24/2016 1306   BILITOT 0.90 09/08/2016 1336   BILITOT 0.83 03/24/2016 1306      Impression and Plan: [name] is 71 year old female with polycythemia vera. She  is very hyperviscous.  We will go ahead and phlebotomize her today.  I think that her symptoms justify her being phlebotomized.  We will continue to check her blood counts every 2 weeks.  I will plan to see her back in 8 weeks.   I did give her a prescription for Zaroxolyn to take before her Lasix to see if this would help with some of the peripheral edema in her legs.  Sarah Napoleon, MD

## 2017-02-23 NOTE — Addendum Note (Signed)
Addended by: Lianne Cure A on: 02/23/2017 04:08 PM   Modules accepted: Orders

## 2017-02-23 NOTE — Progress Notes (Signed)
[place] presents today for phlebotomy per MD orders. Phlebotomy procedure started at 1420 and ended at 1430. 500 grams removed. Patient observed for 30 minutes after procedure without any incident. Patient tolerated procedure well. IV needle removed intact.  1520 Patient discharged.  Patient feels fine.  Discharged with her husband.

## 2017-02-24 LAB — FERRITIN: FERRITIN: 6 ng/mL — AB (ref 9–269)

## 2017-02-24 LAB — IRON AND TIBC
%SAT: 3 % — AB (ref 21–57)
IRON: 13 ug/dL — AB (ref 41–142)
TIBC: 508 ug/dL — ABNORMAL HIGH (ref 236–444)
UIBC: 494 ug/dL — ABNORMAL HIGH (ref 120–384)

## 2017-02-25 ENCOUNTER — Other Ambulatory Visit: Payer: Self-pay | Admitting: *Deleted

## 2017-02-25 DIAGNOSIS — M797 Fibromyalgia: Secondary | ICD-10-CM

## 2017-02-25 DIAGNOSIS — D45 Polycythemia vera: Secondary | ICD-10-CM

## 2017-02-25 DIAGNOSIS — M62838 Other muscle spasm: Secondary | ICD-10-CM

## 2017-02-25 DIAGNOSIS — G4700 Insomnia, unspecified: Secondary | ICD-10-CM

## 2017-02-25 DIAGNOSIS — I358 Other nonrheumatic aortic valve disorders: Secondary | ICD-10-CM

## 2017-02-25 MED ORDER — TEMAZEPAM 30 MG PO CAPS: ORAL_CAPSULE | ORAL | 2 refills | Status: DC

## 2017-02-26 MED FILL — OxyCONTIN 10 MG T12A: 10 | 30 days supply | Qty: 60 | Fill #0

## 2017-03-03 ENCOUNTER — Other Ambulatory Visit: Payer: Self-pay | Admitting: Family Medicine

## 2017-03-03 ENCOUNTER — Other Ambulatory Visit: Payer: Self-pay | Admitting: Hematology & Oncology

## 2017-03-03 DIAGNOSIS — N390 Urinary tract infection, site not specified: Secondary | ICD-10-CM

## 2017-03-03 DIAGNOSIS — E876 Hypokalemia: Secondary | ICD-10-CM

## 2017-03-03 MED FILL — TEMAZEPAM 30 MG CAPSULE: 30 | 30 days supply | Qty: 30 | Fill #0

## 2017-03-03 MED FILL — CYCLOBENZAPRINE 10 MG TABLE: 10 | 15 days supply | Qty: 60 | Fill #2

## 2017-03-03 MED FILL — ALPRAZolam 0.5 MG TABS: 0.5 | 30 days supply | Qty: 60 | Fill #2

## 2017-03-04 ENCOUNTER — Other Ambulatory Visit: Payer: Self-pay | Admitting: Hematology & Oncology

## 2017-03-07 IMAGING — MR MR HEAD WO/W CM
10 of 13 series · 33 of 48 positions shown · IV contrast (MULTIHANCE)
Comparison: CT head 12/03/2012

CLINICAL DATA: Basilar artery thrombosis. Polycythemia Multon.
Recurrent TIA. Question atrophy. Prior carotid endarterectomy.
Headaches.

EXAM:
MRI HEAD WITHOUT AND WITH CONTRAST
TECHNIQUE: Multiplanar, multiecho pulse sequences of the brain and surrounding
structures were obtained without and with intravenous contrast.
CONTRAST:  15 mL MultiHance IV

[Series 2: T1 · sagittal · 5.0mm · 0.45mm/px · 1 of 23 slices shown]
[im 1/23]
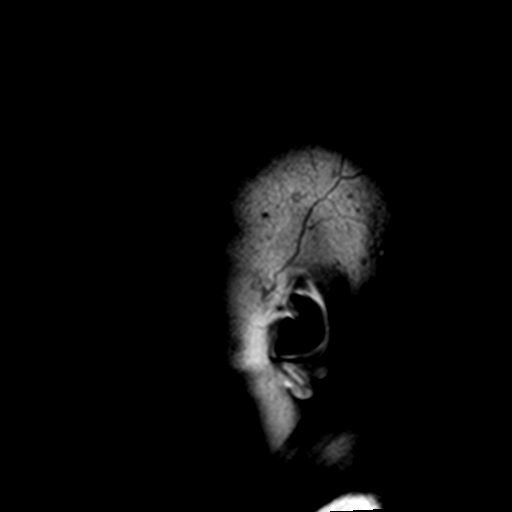

[Series 3: DWI · axial · 3.0mm · 2.19mm/px · z∈[-64,+91]mm · 7 of 95 slices shown (1 of 4)]
[im 1/95]
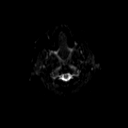
[im 16/95]
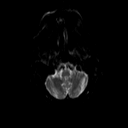
[im 32/95]
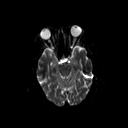
[im 48/95]
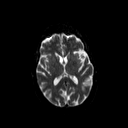
[im 63/95]
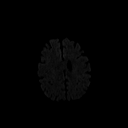
[im 79/95]
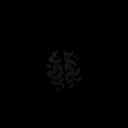
[im 95/95]
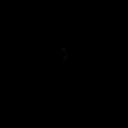

[Series 4: DWI · axial · 3.0mm · 2.19mm/px · z∈[-64,+91]mm · 4 of 48 slices shown (2 of 4)]
[im 1/48]
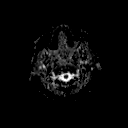
[im 16/48]
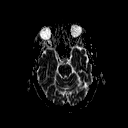
[im 32/48]
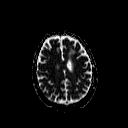
[im 48/48]
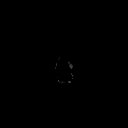

[Series 5: T2 · axial · 5.0mm · 0.45mm/px · z∈[-53,+101]mm · 2 of 23 slices shown (1 of 2)]
[im 1/23]
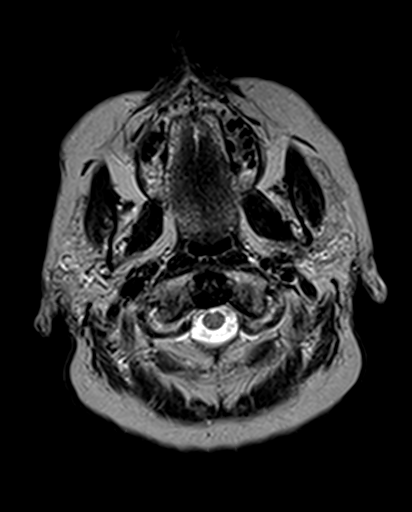
[im 23/23]
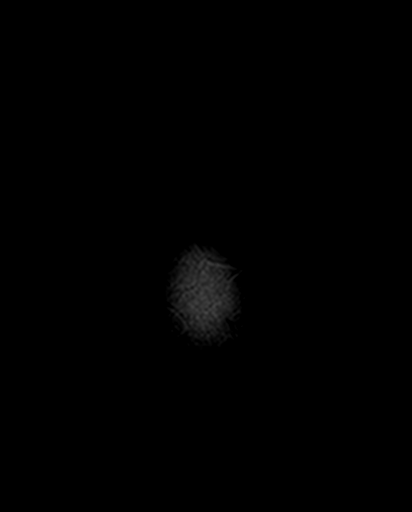

[Series 6: T2 · axial · 5.0mm · 0.45mm/px · z∈[-53,+101]mm · 2 of 23 slices shown (2 of 2)]
[im 1/23]
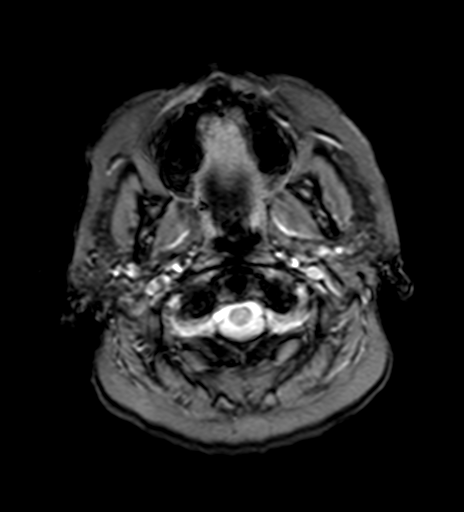
[im 23/23]
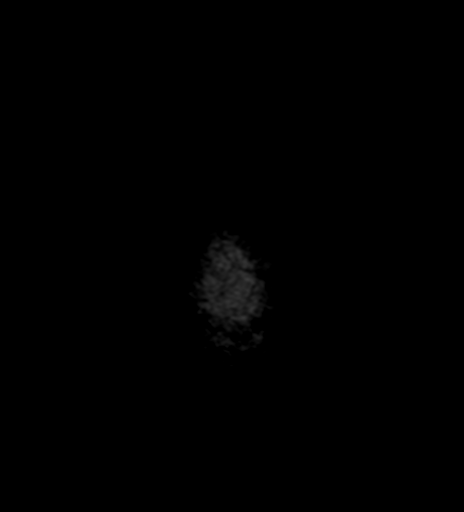

[Series 7: FLAIR · axial · 5.0mm · 0.45mm/px · z∈[-53,+101]mm · 2 of 23 slices shown]
[im 1/23]
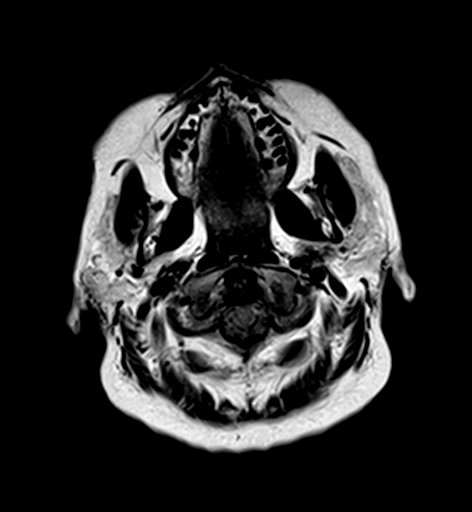
[im 23/23]
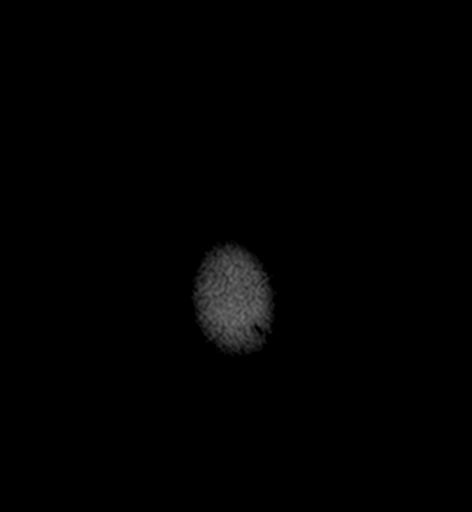

[Series 9: DWI · coronal · 3.0mm · 1.46mm/px · 7 of 92 slices shown (3 of 4)]
[im 1/92]
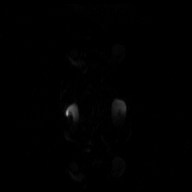
[im 16/92]
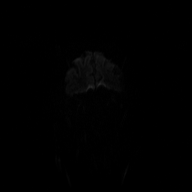
[im 31/92]
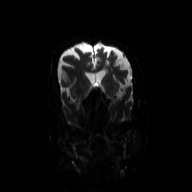
[im 46/92]
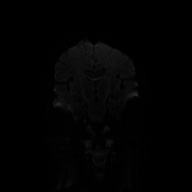
[im 61/92]
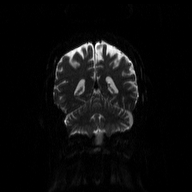
[im 76/92]
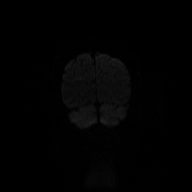
[im 92/92]
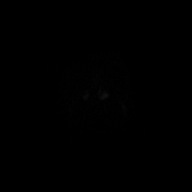

[Series 10: DWI · coronal · 3.0mm · 1.46mm/px · 4 of 46 slices shown (4 of 4)]
[im 1/46]
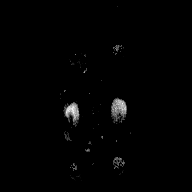
[im 16/46]
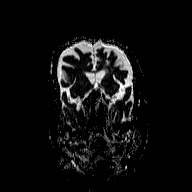
[im 31/46]
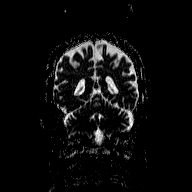
[im 46/46]
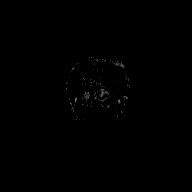

[Series 12: T2 post-contrast · coronal · 5.0mm · 0.45mm/px · 2 of 28 slices shown]
[im 1/28]
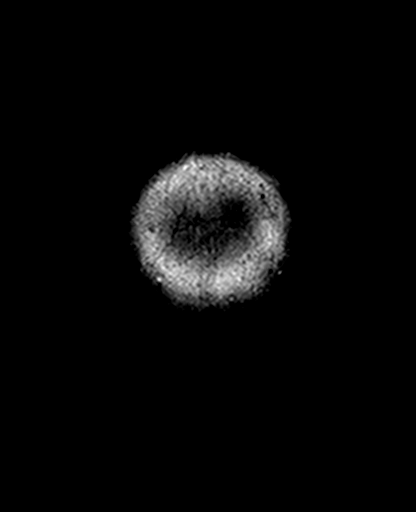
[im 28/28]
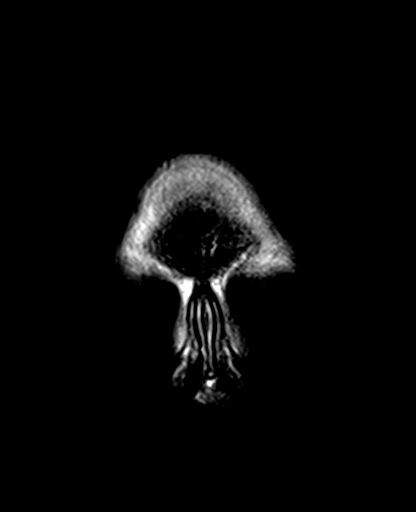

[Series 14: T1 post-contrast · coronal · 5.0mm · 0.45mm/px · 2 of 28 slices shown]
[im 1/28]
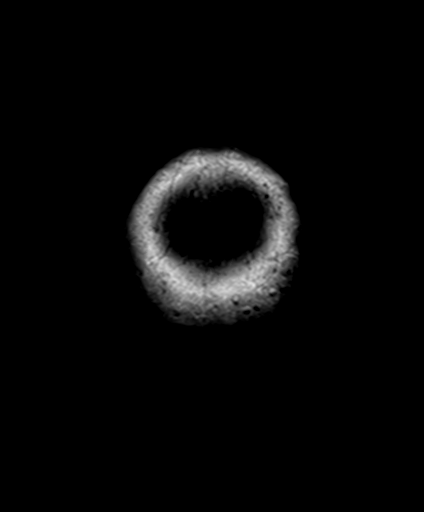
[im 28/28]
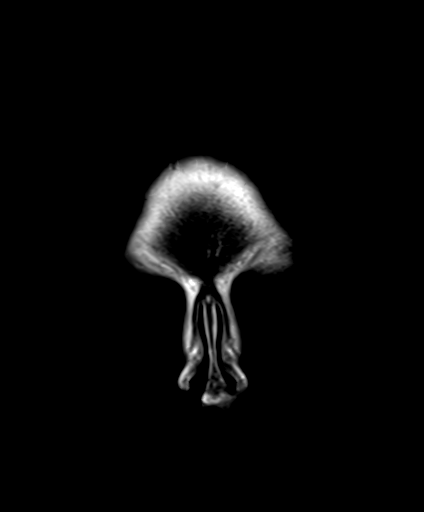

[33 of 48 positions shown; findings below may reference images not displayed]

FINDINGS: Negative for acute infarct.

Cerebral volume and ventricles are appropriate for the patient's
age.

Chronic infarct in the left frontal white matter. This may represent
watershed infarct. Chronic infarct in the head of caudate on the
right which is unchanged. Brainstem and cerebellum intact.

Negative for intracranial hemorrhage.  Negative for mass or edema.

Postcontrast imaging reveals a small punctate area of enhancement in
the left frontal lobe adjacent to an area of chronic infarction.
This is only seen well on the coronal postcontrast images. This
could represent a subacute infarct but does not show restricted
diffusion. Given its location adjacent to an area of chronic
infarction, metastatic disease not considered likely.
IMPRESSION: Negative for acute infarct.

Age-appropriate cerebral volume loss

Chronic infarct in the left frontal white matter, probable watershed
infarct.

Punctate area of enhancement in the left frontal lobe adjacent inter
chronic infarct. This may be an area of subacute infarction

## 2017-03-09 ENCOUNTER — Other Ambulatory Visit: Payer: Medicare Other

## 2017-03-17 ENCOUNTER — Other Ambulatory Visit: Payer: Self-pay | Admitting: *Deleted

## 2017-03-17 DIAGNOSIS — G4700 Insomnia, unspecified: Secondary | ICD-10-CM

## 2017-03-17 DIAGNOSIS — D45 Polycythemia vera: Secondary | ICD-10-CM

## 2017-03-17 DIAGNOSIS — I358 Other nonrheumatic aortic valve disorders: Secondary | ICD-10-CM

## 2017-03-17 DIAGNOSIS — M62838 Other muscle spasm: Secondary | ICD-10-CM

## 2017-03-17 DIAGNOSIS — M797 Fibromyalgia: Secondary | ICD-10-CM

## 2017-03-17 MED ORDER — OXYCODONE HCL ER 10 MG PO T12A: 10.0000 mg | EXTENDED_RELEASE_TABLET | Freq: Two times a day (BID) | ORAL | 0 refills | Status: DC

## 2017-03-17 MED ORDER — OXYCODONE HCL 15 MG PO TABS: 15.0000 mg | ORAL_TABLET | Freq: Four times a day (QID) | ORAL | 0 refills | Status: DC | PRN

## 2017-03-17 MED ORDER — OXYCODONE HCL ER 20 MG PO T12A: 20.0000 mg | EXTENDED_RELEASE_TABLET | Freq: Three times a day (TID) | ORAL | 0 refills | Status: DC

## 2017-03-23 ENCOUNTER — Other Ambulatory Visit (HOSPITAL_BASED_OUTPATIENT_CLINIC_OR_DEPARTMENT_OTHER): Payer: Medicare Other

## 2017-03-23 ENCOUNTER — Other Ambulatory Visit: Payer: Self-pay | Admitting: *Deleted

## 2017-03-23 ENCOUNTER — Telehealth: Payer: Self-pay | Admitting: *Deleted

## 2017-03-23 DIAGNOSIS — I358 Other nonrheumatic aortic valve disorders: Secondary | ICD-10-CM

## 2017-03-23 DIAGNOSIS — D45 Polycythemia vera: Secondary | ICD-10-CM

## 2017-03-23 DIAGNOSIS — M797 Fibromyalgia: Secondary | ICD-10-CM

## 2017-03-23 DIAGNOSIS — M62838 Other muscle spasm: Secondary | ICD-10-CM

## 2017-03-23 DIAGNOSIS — G4700 Insomnia, unspecified: Secondary | ICD-10-CM

## 2017-03-23 LAB — CBC WITH DIFFERENTIAL (CANCER CENTER ONLY)
BASO#: 0 10*3/uL (ref 0.0–0.2)
BASO%: 0.4 % (ref 0.0–2.0)
EOS%: 4.9 % (ref 0.0–7.0)
Eosinophils Absolute: 0.2 10*3/uL (ref 0.0–0.5)
HCT: 22.9 % — ABNORMAL LOW (ref 34.8–46.6)
HEMOGLOBIN: 5.6 g/dL — AB (ref 11.6–15.9)
LYMPH#: 0.8 10*3/uL — ABNORMAL LOW (ref 0.9–3.3)
LYMPH%: 16.5 % (ref 14.0–48.0)
MCH: 16.2 pg — ABNORMAL LOW (ref 26.0–34.0)
MCHC: 24.5 g/dL — AB (ref 32.0–36.0)
MCV: 66 fL — ABNORMAL LOW (ref 81–101)
MONO#: 0.6 10*3/uL (ref 0.1–0.9)
MONO%: 12 % (ref 0.0–13.0)
NEUT%: 66.2 % (ref 39.6–80.0)
NEUTROS ABS: 3.2 10*3/uL (ref 1.5–6.5)
PLATELETS: 141 10*3/uL — AB (ref 145–400)
RBC: 3.45 10*6/uL — ABNORMAL LOW (ref 3.70–5.32)
RDW: 21.1 % — AB (ref 11.1–15.7)
WBC: 4.9 10*3/uL (ref 3.9–10.0)

## 2017-03-23 MED ORDER — ALPRAZOLAM 0.5 MG PO TABS: 0.5000 mg | ORAL_TABLET | Freq: Two times a day (BID) | ORAL | 0 refills | Status: DC

## 2017-03-23 MED ORDER — TEMAZEPAM 30 MG PO CAPS: ORAL_CAPSULE | ORAL | 2 refills | Status: AC

## 2017-03-23 NOTE — Telephone Encounter (Signed)
03/17/2017 - Received request from patient's husband to refill patient's OxyContin 20mg . This is in addition to OxyContin 10mg . Refill escribed to [facility] that afternoon.  03/18/2017 - Received call from [place] from [facility] wanting clarification on dosing. Per Suezanne Jacquet, the pharmacy hadn't filled the OxyContin 20mg  since 06/2016. They had been filling the 10mg  monthly. He also searched the database and the patient hadn't filled the OxyContin at any other pharmacy. On review of our records, the patient had been supplied with a paper script for OxyContin 20mg  monthly from April 2018 to present.  03/18/2017 1630 - Made several attempts to call patient to inquire on why the office had been supplying prescriptions that were not being filled. Finally got through to patient at 1630. Patient immediately requested that I speak to her husband regarding the situation as "he handles everything with my medication"  When the patient's husband, Shanon Brow, was presented with the question he immediately became very defensive. At first he was upset that the medication was escribed. He stated that by doing this we were putting them into the donut hole and putting them in financial peril. I explained that the prescriptions were on hold and would not be filled until they requested the pharmacy to do so. Also explained the new process for narcotics. That all of them would now be escribed and paper prescriptions would not longer be given.  He then attempted to explain why the prescription hadn't been filled since April, saying the patient didn't take them on days she had phlebotomies. When I tried to probe with further questions - she received 90 pills in April and should have needed a refill long before now even with holding on the day of phlebotomies, he became more defensive stating "you're getting in the middle of things where you don't belong". I explained several times that we weren't trying to stop proviging the  medication, but the office needed to better understand why so many unfilled OxyContin prescriptions had been provided. (7 paper prescriptions have been provided to the patient since April that have gone unfilled). Explained that OxyContin was a highly controlled substance and that we should only be providing new prescriptions when the patient needed a new supply of said medication. Shanon Brow then started saying that with the pain clinic rules, you need to fill the prescription every four weeks as prescribed or that you'd be in violation of the contract. I explained that this office is not a pain clinic and that we have never had a contract with his wife, or required that they request a new script every 4 weeks. Again, explained that the ONLY time he should be making a refill request is when the patient needs a new supply of medication, and that ideally the patient should be taking the medications as prescribed by his provider. At that point, Shanon Brow stated he had enough supply of OxyContin 20mg , OxyContin 10mg , and Oxycodone 15mg  at home to use until his wife's appointment with Dr Marin Olp and he wouldn't fill any of it until he spoke with Dr Marin Olp. He stated he was no longer going to speak to me regarding this issue.   Note, that all these prescriptions were last provided on 02/21/2017 and if taken as prescribed, the patient should not have enough supply to last until 04/20/2017.  Clinic Director Hilario Quarry, and Dr Marin Olp notified of above.

## 2017-03-23 NOTE — Telephone Encounter (Signed)
Critical Value Hgb 5.6 Dr Marin Olp notified. No orders at this time

## 2017-03-26 ENCOUNTER — Other Ambulatory Visit: Payer: Self-pay | Admitting: Family Medicine

## 2017-03-26 DIAGNOSIS — E039 Hypothyroidism, unspecified: Secondary | ICD-10-CM

## 2017-04-01 MED FILL — TEMAZEPAM 30 MG CAPSULE: 30 | 30 days supply | Qty: 30 | Fill #0

## 2017-04-01 MED FILL — CYCLOBENZAPRINE HCL 10 MG T: 10 | 15 days supply | Qty: 60 | Fill #3

## 2017-04-01 MED FILL — ALPRAZolam 0.5 MG TABS: 0.5 | 30 days supply | Qty: 60 | Fill #0

## 2017-04-05 ENCOUNTER — Other Ambulatory Visit: Payer: Self-pay | Admitting: *Deleted

## 2017-04-05 DIAGNOSIS — D45 Polycythemia vera: Secondary | ICD-10-CM

## 2017-04-06 ENCOUNTER — Inpatient Hospital Stay: Payer: Medicare Other | Attending: Hematology & Oncology

## 2017-04-06 ENCOUNTER — Telehealth: Payer: Self-pay | Admitting: *Deleted

## 2017-04-06 DIAGNOSIS — M549 Dorsalgia, unspecified: Secondary | ICD-10-CM | POA: Insufficient documentation

## 2017-04-06 DIAGNOSIS — D45 Polycythemia vera: Secondary | ICD-10-CM | POA: Diagnosis not present

## 2017-04-06 DIAGNOSIS — R51 Headache: Secondary | ICD-10-CM | POA: Insufficient documentation

## 2017-04-06 DIAGNOSIS — Z8673 Personal history of transient ischemic attack (TIA), and cerebral infarction without residual deficits: Secondary | ICD-10-CM | POA: Insufficient documentation

## 2017-04-06 DIAGNOSIS — M2550 Pain in unspecified joint: Secondary | ICD-10-CM | POA: Diagnosis not present

## 2017-04-06 DIAGNOSIS — M791 Myalgia, unspecified site: Secondary | ICD-10-CM | POA: Diagnosis not present

## 2017-04-06 DIAGNOSIS — M542 Cervicalgia: Secondary | ICD-10-CM | POA: Diagnosis not present

## 2017-04-06 LAB — CBC WITH DIFFERENTIAL (CANCER CENTER ONLY)
Abs Granulocyte: 2.6 10*3/uL (ref 1.5–6.5)
BASOS PCT: 0 %
Basophils Absolute: 0 10*3/uL (ref 0.0–0.1)
EOS ABS: 0.3 10*3/uL (ref 0.0–0.5)
Eosinophils Relative: 7 %
HCT: 23.3 % — ABNORMAL LOW (ref 34.8–46.6)
HEMOGLOBIN: 5.6 g/dL — AB (ref 11.6–15.9)
Lymphocytes Relative: 17 %
Lymphs Abs: 0.7 10*3/uL — ABNORMAL LOW (ref 0.9–3.3)
MCH: 16 pg — ABNORMAL LOW (ref 26.0–34.0)
MCHC: 24 g/dL — AB (ref 32.0–36.0)
MCV: 66.6 fL — ABNORMAL LOW (ref 81.0–101.0)
MONOS PCT: 13 %
Monocytes Absolute: 0.5 10*3/uL (ref 0.1–0.9)
Neutro Abs: 2.6 10*3/uL (ref 1.5–6.5)
Neutrophils Relative %: 63 %
Platelet Count: 145 10*3/uL (ref 145–400)
RBC: 3.5 MIL/uL — ABNORMAL LOW (ref 3.70–5.32)
RDW: 21.3 % — AB (ref 11.1–15.7)
WBC: 4.2 10*3/uL (ref 4.0–10.3)

## 2017-04-06 LAB — CMP (CANCER CENTER ONLY)
ALBUMIN: 3.7 g/dL (ref 3.5–5.0)
ALT: 9 U/L (ref 0–55)
ANION GAP: 7 (ref 3–11)
AST: 19 U/L (ref 5–34)
Alkaline Phosphatase: 96 U/L (ref 40–150)
BUN: 11 mg/dL (ref 7–26)
CO2: 30 mmol/L — AB (ref 22–29)
Calcium: 9 mg/dL (ref 8.4–10.4)
Chloride: 101 mmol/L (ref 98–109)
Creatinine: 0.87 mg/dL (ref 0.70–1.30)
GFR, Est AFR Am: >60 mL/min (ref >60)
GFR, Estimated: >60 mL/min (ref >60)
GLUCOSE: 108 mg/dL (ref 70–140)
POTASSIUM: 4 mmol/L (ref 3.3–4.7)
SODIUM: 138 mmol/L (ref 136–145)
Total Bilirubin: 0.7 mg/dL (ref 0.2–1.2)
Total Protein: 6.7 g/dL (ref 6.4–8.3)

## 2017-04-06 NOTE — Telephone Encounter (Signed)
Critical Value Hgb 6.0 Dr Ennever notified. No orders at this time 

## 2017-04-12 ENCOUNTER — Ambulatory Visit (INDEPENDENT_AMBULATORY_CARE_PROVIDER_SITE_OTHER): Payer: Medicare Other | Admitting: Family Medicine

## 2017-04-12 ENCOUNTER — Encounter: Payer: Self-pay | Admitting: Family Medicine

## 2017-04-12 VITALS — BP 123/46 | HR 71 | Temp 98.1°F | Resp 16 | Ht 62.99 in | Wt 164.0 lb

## 2017-04-12 DIAGNOSIS — E039 Hypothyroidism, unspecified: Secondary | ICD-10-CM

## 2017-04-12 NOTE — Progress Notes (Signed)
Patient ID: [name], female    DOB: 05-29-1945  Age: 72 y.o. MRN: [id number]    Subjective:  Subjective  HPI VELMER BROADFOOT presents for f/u thyroid   Review of Systems  Constitutional: Negative for activity change, appetite change, fatigue and unexpected weight change.  Respiratory: Negative for cough and shortness of breath.   Cardiovascular: Negative for chest pain and palpitations.  Psychiatric/Behavioral: Negative for behavioral problems and dysphoric mood. The patient is not nervous/anxious.     History Past Medical History:  Diagnosis Date  . Acute cholecystitis 10/07/2012   s/p pec drain due to recent CVA, pending chole 11/2012  . Anemia    "due to polycythemia vera" (12/28/2012)  . Anxiety   . Aortic valve sclerosis    by echocardiogram 12/04/2009  . Asthma    "wheeze occasionally; I don't have asthma" (12/28/2012)  . Chronic back pain   . Chronic constipation    takes Mineral Oil,Juice,Enema(prn),and Miralax(Prn) and Cascara nightly  . Complex partial seizure disorder ([place]) 10/15/2012  . CVA (cerebral infarction) 10-05-12   rHP, improved, complicated by sz event x 1  . Difficult intubation    grade 3 airway  . Exertional dyspnea   . Fibromyalgia    chronic pain syndrome  . GERD (gastroesophageal reflux disease)   . Heart murmur    "flow mumur" (12/28/2012)  . History of blood transfusion 09/2012; 12/28/2012  . Hyperlipidemia    "not since carotid OR" (12/28/2012)  . Hypertension    "not since carotid OR" (12/28/2012)  . Hypothyroidism   . Insomnia    takes restoril nightly and Xanax  . 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 Occlusion and stenosis of carotid artery without mention of cerebral infarction 10/26/2012  . PAC (premature atrial contraction)   . 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 PONV (postoperative nausea and vomiting)    "I vomit for 5 days straight w/certain RX w/thanes" (12/28/2012)  . Postmenopausal state    on hormone  replacement therapy  . Seasonal allergies    takes Allegra in am and Benadryl at night  . Seizures ([place])    only seizure was 10/14/12 "after carotid endarterectomy";takes Keppra daily  . Stroke [facility]) 09/2012   "after they put drain in my gallbladder"; residual is "haven't felt well enough to tell since stroke to tell; weak already; weaker when I'm tired" (12/28/2012)    She has a past surgical history that includes Endarterectomy (Left, 10/10/2012); Carotid endarterectomy (Left, 10-10-12); Abdominal hysterectomy (1996); Tonsillectomy; Nasal septum surgery; Colonoscopy; insertion of drain (10/02/12); PICC line placed; Tubal ligation (~ 1972); Esophagogastroduodenoscopy (N/A, 01/02/2013); Cholecystectomy (N/A, 11/29/2012); Cholecystectomy (N/A, 11/29/2012); and Colonoscopy (N/A, 05/14/2013).   Her family history includes Coronary artery disease in her mother; Heart attack in her father and mother; Heart disease in her father and mother; Hyperlipidemia in her sister; Hypertension in her sister.She reports that  has never smoked. she has never used smokeless tobacco. She reports that she does not drink alcohol or use drugs.  Current Outpatient Medications on File Prior to Visit  Medication Sig Dispense Refill  . ALPRAZolam (XANAX) 0.5 MG tablet Take 1 tablet (0.5 mg total) by mouth 2 (two) times daily. 60 tablet 0  . ARMOUR THYROID 120 MG tablet TAKE 1 TABLET BY MOUTH DAILY BEFORE BREAKFAST. 90 tablet 2  . ARMOUR THYROID 30 MG tablet Take 1 tablet (30 mg total) by mouth daily. 90 tablet 0  . aspirin 325  MG tablet Take 325 mg by mouth daily after breakfast. "to thin  Blood"    . bisoprolol (ZEBETA) 10 MG tablet TAKE 1 TABLET EVERY DAY 90 tablet 2  . Cascara [place] 450 MG CAPS Take 900 mg by mouth at bedtime.    . ciprofloxacin (CIPRO) 500 MG tablet TAKE 1 TABLET BY MOUTH TWICE A DAY FOR 14 DAYS 28 tablet 2  . clindamycin (CLEOCIN) 300 MG capsule Take 2 capsules by mouth as directed.    . clopidogrel (PLAVIX)  75 MG tablet TAKE 1 TABLET (75 MG TOTAL) BY MOUTH DAILY. 30 tablet 3  . cyclobenzaprine (FLEXERIL) 10 MG tablet Take 0.5-1 tablets (5-10 mg total) by mouth every 6 (six) hours as needed for muscle spasms. 60 tablet 5  . diphenhydrAMINE (BENADRYL) 25 MG tablet Take 50 mg by mouth every 8 (eight) hours as needed. Patient may take 50 mg three times a day as needed for allergies    . docusate sodium (COLACE) 100 MG capsule Take 100 mg by mouth 3 (three) times daily with meals.     . famotidine (PEPCID) 20 MG tablet TAKE 1 TABLET BY MOUTH TWICE A DAY BEFORE A MEAL *4/29* 180 tablet 3  . fexofenadine (ALLEGRA) 180 MG tablet Take 180 mg by mouth daily after breakfast.     . furosemide (LASIX) 40 MG tablet TAKE 1 TABLET BY MOUTH TWICE DAILY 180 tablet 2  . Garlic Oil 2683 MG CAPS Take 1,000 mg by mouth daily after breakfast. Reported on 06/03/2015    . guaiFENesin (MUCINEX) 600 MG 12 hr tablet Take 400 mg by mouth daily.     . hydrochlorothiazide (MICROZIDE) 12.5 MG capsule TAKE 1 CAPSULE BY MOUTH DAILY 90 capsule 3  . Ipratropium-Albuterol (COMBIVENT RESPIMAT) 20-100 MCG/ACT AERS respimat Inhale 2 puffs into the lungs 4 (four) times daily.    Marland Kitchen levETIRAcetam (KEPPRA) 250 MG tablet TAKE 1 TABLET TWICE A DAY 180 tablet 2  . lidocaine-prilocaine (EMLA) cream Apply 1 application topically as needed (to numb the phlebotomy site). 30 g 6  . lisinopril (PRINIVIL,ZESTRIL) 10 MG tablet Take 10 mg by mouth 2 (two) times daily as needed. Reported on 06/03/2015    . magnesium oxide (MAG-OX) 400 MG tablet Take 1,200 mg by mouth daily.    . mineral oil liquid Take 60 mLs by mouth at bedtime. With Juice    . Misc Natural Products (TART CHERRY ADVANCED PO) Take 1,200 mg by mouth 2 (two) times daily. Reported on 06/03/2015    . nitroGLYCERIN (NITROSTAT) 0.4 MG SL tablet Place 0.4 mg under the tongue every 5 (five) minutes as needed for chest pain.    . NONFORMULARY OR COMPOUNDED ITEM Cmp, lipid, cbcd , , tsh.magnesium -- dx  hypothyroidism, hx cva, hypokalemia, 1 each 0  . ondansetron (ZOFRAN-ODT) 4 MG disintegrating tablet TAKE 1 TABLET (4 MG TOTAL) BY MOUTH EVERY 8 (EIGHT) HOURS AS NEEDED FOR NAUSEA OR VOMITING (NAUSEA). 30 tablet 2  . oxyCODONE (OXYCONTIN) 10 mg 12 hr tablet Take 1 tablet (10 mg total) by mouth 2 (two) times daily. Take one tablet by mouth at 8am and one tablet by mouth at 4pm 60 tablet 0  . oxyCODONE (OXYCONTIN) 20 mg 12 hr tablet Take 1 tablet (20 mg total) by mouth 3 (three) times daily. Take 1 tablet by mouth at 12noon, 8pm and 4am 90 tablet 0  . oxyCODONE (ROXICODONE) 15 MG immediate release tablet Take 1 tablet (15 mg total) by mouth every 6 (six)  hours as needed for pain. 90 tablet 0  . potassium chloride (K-DUR) 10 MEQ tablet TAKE 8 TABLETS BY MOUTH DAILY ([id number] USHER SMITH OR AMY MFT) 240 tablet 2  . promethazine (PHENERGAN) 25 MG tablet Take 25 mg by mouth every 6 (six) hours as needed for nausea.    . Sennosides (SENNA LAX PO) Take 1 tablet by mouth 3 (three) times daily after meals.     . temazepam (RESTORIL) 30 MG capsule TAKE 1 CAPSULE BY MOUTH AT BEDTIME AS NEEDED FOR SLEEP 30 capsule 2  . triamcinolone cream (KENALOG) 0.1 % Apply 1 application topically 2 (two) times daily as needed (to skin).    . Vitamin D, Cholecalciferol, 1000 UNITS TABS Take 4,000 Units by mouth every morning.     No current facility-administered medications on file prior to visit.      Objective:  Objective  Physical Exam  Constitutional: She is oriented to person, place, and time. She appears well-developed and well-nourished.  HENT:  Head: Normocephalic and atraumatic.  Eyes: Conjunctivae and EOM are normal.  Neck: Normal range of motion. Neck supple. No JVD present. Carotid bruit is not present. No thyromegaly present.  Cardiovascular: Normal rate, regular rhythm and normal heart sounds.  No murmur heard. Pulmonary/Chest: Effort normal and breath sounds normal. No respiratory distress. She has no  wheezes. She has no rales. She exhibits no tenderness.  Musculoskeletal: She exhibits no edema.  Neurological: She is alert and oriented to person, place, and time.  Psychiatric: She has a normal mood and affect.  Nursing note and vitals reviewed.  BP (!) 123/46 (BP Location: Left Arm, Patient Position: Sitting, Cuff Size: Normal)   Pulse 71   Temp 98.1 F (36.7 C) (Oral)   Resp 16   Ht 5' 2.99" (1.6 m)   Wt 164 lb (74.4 kg)   SpO2 99%   BMI 29.06 kg/m  Wt Readings from Last 3 Encounters:  04/12/17 164 lb (74.4 kg)  02/23/17 165 lb (74.8 kg)  01/20/17 162 lb 3.2 oz (73.6 kg)     Lab Results  Component Value Date   WBC 4.9 03/23/2017   HGB 5.6 (LL) 03/23/2017   HCT 23.3 (L) 04/06/2017   PLT 141 (L) 03/23/2017   GLUCOSE 108 04/06/2017   CHOL 78 (L) 01/21/2016   TRIG 54 01/21/2016   HDL 33 (L) 01/21/2016   LDLCALC 34 01/21/2016   ALT 9 04/06/2017   AST 19 04/06/2017   NA 138 04/06/2017   K 4.0 04/06/2017   CL 101 04/06/2017   CREATININE 0.8 02/23/2017   BUN 11 04/06/2017   CO2 30 (H) 04/06/2017   TSH 0.03 (L) 06/08/2016   INR 1.15 11/21/2012   HGBA1C 5.5 10/07/2012    No results found.   Assessment & Plan:  Plan  I have discontinued Aracely Rickett. Zeiter's metolazone. I am also having her maintain her fexofenadine, aspirin, diphenhydrAMINE, docusate sodium, mineral oil, Cascara [place], Garlic Oil, magnesium oxide, promethazine, lisinopril, triamcinolone cream, nitroGLYCERIN, Sennosides (SENNA LAX PO), Vitamin D (Cholecalciferol), lidocaine-prilocaine, Misc Natural Products (TART CHERRY ADVANCED PO), Ipratropium-Albuterol, guaiFENesin, clindamycin, NONFORMULARY OR COMPOUNDED ITEM, ARMOUR THYROID, famotidine, cyclobenzaprine, furosemide, bisoprolol, hydrochlorothiazide, levETIRAcetam, clopidogrel, potassium chloride, ciprofloxacin, ondansetron, oxyCODONE, oxyCODONE, oxyCODONE, ALPRAZolam, temazepam, and ARMOUR THYROID.  No orders of the defined types were placed in this  encounter.   Problem List Items Addressed This Visit    None    Visit Diagnoses    Hypothyroidism, unspecified type    -  Primary  Relevant Orders   TSH   Lipid panel   Comprehensive metabolic panel   T3, free   T4, free    pt will have labs done in oncologist office   Follow-up: Return in about 6 months (around 10/10/2017), or if symptoms worsen or fail to improve, for awv  soon.  Ann Held, DO

## 2017-04-12 NOTE — Patient Instructions (Signed)
Thyroid-Stimulating Hormone Test Why am I having this test? A thyroid-stimulating hormone (TSH) test is a blood test that is done to measure the level of TSH, also known as thyrotropin, in your blood. TSH is produced by the pituitary gland. The pituitary gland is a small organ located just below the brain, behind your eyes and nasal passages. It is part of a system that monitors and maintains thyroid hormone levels and thyroid gland function. Thyroid hormones affect many body parts and systems, including the system that affects how quickly your body burns fuel for energy. Your health care provider may recommend testing your TSH level if you have signs and symptoms of abnormal thyroid hormone levels. Knowing the level of TSH in your blood can help your health care provider:  Diagnose a thyroid gland or pituitary gland disorder.  Manage your condition and treatment if you have hypothyroidism or hyperthyroidism.  What kind of sample is taken? A blood sample is required for this test. It is usually collected by inserting a needle into a vein. How do I prepare for this test? There is no preparation required for this test. What are the reference ranges? Reference rangesare considered healthy rangesestablished after testing a large group of healthy people. Reference rangesmay vary among different people, labs, and hospitals. It is your responsibility to obtain your test results. Ask the lab or department performing the test when and how you will get your results. Range of Normal Values:  Adult: 0.3-5 microunits/mL or 0.3-5 milliunits/L (SI units).  Newborn: 3-18 microunits/mL or 3-18 milliunits/L.  Cord: 3-12 microunits/mL or 3-12 milliunits/L.  What do the results mean? A high level of TSH may mean:  Your thyroid gland is not making enough thyroid hormones. When the thyroid gland does not make enough thyroid hormones, the pituitary gland releases TSH into the bloodstream. The  higher-than-normal levels of TSH prompt the thyroid gland to release more thyroid hormones.  You are getting an insufficient level of thyroid hormone medicine, if you are receiving this type of treatment.  There is a problem with the pituitary gland (rare).  A low level of TSH can indicate a problem with the pituitary gland. Talk with your health care provider to discuss your results, treatment options, and if necessary, the need for more tests. Talk with your health care provider if you have any questions about your results. Talk with your health care provider to discuss your results, treatment options, and if necessary, the need for more tests. Talk with your health care provider if you have any questions about your results. This information is not intended to replace advice given to you by your health care provider. Make sure you discuss any questions you have with your health care provider. Document Released: 04/09/2004 Document Revised: 11/16/2015 Document Reviewed: 08/08/2013 Elsevier Interactive Patient Education  2018 [employer]  

## 2017-04-20 ENCOUNTER — Other Ambulatory Visit: Payer: Self-pay

## 2017-04-20 ENCOUNTER — Inpatient Hospital Stay: Payer: Medicare Other

## 2017-04-20 ENCOUNTER — Inpatient Hospital Stay (HOSPITAL_BASED_OUTPATIENT_CLINIC_OR_DEPARTMENT_OTHER): Payer: Medicare Other

## 2017-04-20 ENCOUNTER — Telehealth: Payer: Self-pay | Admitting: *Deleted

## 2017-04-20 ENCOUNTER — Inpatient Hospital Stay (HOSPITAL_BASED_OUTPATIENT_CLINIC_OR_DEPARTMENT_OTHER): Payer: Medicare Other | Admitting: Hematology & Oncology

## 2017-04-20 VITALS — BP 118/72 | HR 80 | Resp 16

## 2017-04-20 VITALS — BP 131/35 | HR 81 | Temp 98.2°F | Resp 16 | Wt 167.0 lb

## 2017-04-20 DIAGNOSIS — D45 Polycythemia vera: Secondary | ICD-10-CM | POA: Diagnosis not present

## 2017-04-20 DIAGNOSIS — Z8673 Personal history of transient ischemic attack (TIA), and cerebral infarction without residual deficits: Secondary | ICD-10-CM | POA: Diagnosis not present

## 2017-04-20 DIAGNOSIS — M791 Myalgia, unspecified site: Secondary | ICD-10-CM

## 2017-04-20 DIAGNOSIS — M542 Cervicalgia: Secondary | ICD-10-CM | POA: Diagnosis not present

## 2017-04-20 DIAGNOSIS — M549 Dorsalgia, unspecified: Secondary | ICD-10-CM | POA: Diagnosis not present

## 2017-04-20 DIAGNOSIS — R51 Headache: Secondary | ICD-10-CM | POA: Diagnosis not present

## 2017-04-20 DIAGNOSIS — M2550 Pain in unspecified joint: Secondary | ICD-10-CM

## 2017-04-20 DIAGNOSIS — E038 Other specified hypothyroidism: Secondary | ICD-10-CM

## 2017-04-20 LAB — CMP (CANCER CENTER ONLY)
ALBUMIN: 3.7 g/dL (ref 3.5–5.0)
ALT: 14 U/L (ref 0–55)
AST: 26 U/L (ref 5–34)
Alkaline Phosphatase: 82 U/L (ref 26–84)
Anion gap: 8 (ref 5–15)
BUN: 9 mg/dL (ref 7–22)
CALCIUM: 9 mg/dL (ref 8.0–10.3)
CO2: 32 mmol/L (ref 18–33)
CREATININE: 0.8 mg/dL (ref 0.60–1.10)
Chloride: 102 mmol/L (ref 98–108)
Glucose, Bld: 140 mg/dL — ABNORMAL HIGH (ref 73–118)
Potassium: 3.3 mmol/L — ABNORMAL LOW (ref 3.5–5.1)
Sodium: 142 mmol/L (ref 128–145)
TOTAL PROTEIN: 7.1 g/dL (ref 6.4–8.1)
Total Bilirubin: 0.7 mg/dL (ref 0.2–1.2)

## 2017-04-20 LAB — CBC WITH DIFFERENTIAL (CANCER CENTER ONLY)
BASOS ABS: 0 10*3/uL (ref 0.0–0.1)
BASOS PCT: 0 %
Eosinophils Absolute: 0.4 10*3/uL (ref 0.0–0.5)
Eosinophils Relative: 8 %
HEMATOCRIT: 25.9 % — AB (ref 34.8–46.6)
HEMOGLOBIN: 6.3 g/dL — AB (ref 11.6–15.9)
LYMPHS PCT: 14 %
Lymphs Abs: 0.7 10*3/uL — ABNORMAL LOW (ref 0.9–3.3)
MCH: 16.4 pg — ABNORMAL LOW (ref 26.0–34.0)
MCHC: 24.3 g/dL — ABNORMAL LOW (ref 32.0–36.0)
MCV: 67.4 fL — AB (ref 81.0–101.0)
Monocytes Absolute: 0.5 10*3/uL (ref 0.1–0.9)
Monocytes Relative: 11 %
NEUTROS ABS: 3.2 10*3/uL (ref 1.5–6.5)
NEUTROS PCT: 67 %
Platelet Count: 144 10*3/uL — ABNORMAL LOW (ref 145–400)
RBC: 3.84 MIL/uL (ref 3.70–5.32)
RDW: 22.1 % — ABNORMAL HIGH (ref 11.1–15.7)
WBC: 4.8 10*3/uL (ref 3.9–10.3)

## 2017-04-20 MED ORDER — LIDOCAINE-PRILOCAINE 2.5-2.5 % EX CREA: 1.0000 "application " | TOPICAL_CREAM | CUTANEOUS | 6 refills | Status: AC | PRN

## 2017-04-20 MED FILL — OxyCONTIN 20 MG T12A: 20 | 30 days supply | Qty: 90 | Fill #0

## 2017-04-20 MED FILL — OxyCONTIN 10 MG T12A: 10 | 30 days supply | Qty: 60 | Fill #0

## 2017-04-20 NOTE — Progress Notes (Signed)
[name] presents today for phlebotomy per MD orders. Phlebotomy procedure started at 1400 and ended at 1415. 500 grams removed. Patient observed for 30 minutes after procedure without any incident. Patient tolerated procedure well. IV needle removed intact.

## 2017-04-20 NOTE — Telephone Encounter (Signed)
Critical Value Hgb 6.3 Dr Marin Olp notified. Orders will be placed

## 2017-04-20 NOTE — Patient Instructions (Signed)
Therapeutic Phlebotomy Therapeutic phlebotomy is the controlled removal of blood from a person's body for the purpose of treating a medical condition. The procedure is similar to donating blood. Usually, about a pint (470 mL, or 0.47L) of blood is removed. The average adult has 9-12 pints (4.3-5.7 L) of blood. Therapeutic phlebotomy may be used to treat the following medical conditions:  Hemochromatosis. This is a condition in which the blood contains too much iron.  Polycythemia vera. This is a condition in which the blood contains too many red blood cells.  Porphyria cutanea tarda. This is a disease in which an important part of hemoglobin is not made properly. It results in the buildup of abnormal amounts of porphyrins in the body.  Sickle cell disease. This is a condition in which the red blood cells form an abnormal crescent shape rather than a round shape.  Tell a health care provider about:  Any allergies you have.  All medicines you are taking, including vitamins, herbs, eye drops, creams, and over-the-counter medicines.  Any problems you or family members have had with anesthetic medicines.  Any blood disorders you have.  Any surgeries you have had.  Any medical conditions you have. What are the risks? Generally, this is a safe procedure. However, problems may occur, including:  Nausea or light-headedness.  Low blood pressure.  Soreness, bleeding, swelling, or bruising at the needle insertion site.  Infection.  What happens before the procedure?  Follow instructions from your health care provider about eating or drinking restrictions.  Ask your health care provider about changing or stopping your regular medicines. This is especially important if you are taking diabetes medicines or blood thinners.  Wear clothing with sleeves that can be raised above the elbow.  Plan to have someone take you home after the procedure.  You may have a blood sample taken. What  happens during the procedure?  A needle will be inserted into one of your veins.  Tubing and a collection bag will be attached to that needle.  Blood will flow through the needle and tubing into the collection bag.  You may be asked to open and close your hand slowly and continually during the entire collection.  After the specified amount of blood has been removed from your body, the collection bag and tubing will be clamped.  The needle will be removed from your vein.  Pressure will be held on the site of the needle insertion to stop the bleeding.  A bandage (dressing) will be placed over the needle insertion site. The procedure may vary among health care providers and hospitals. What happens after the procedure?  Your recovery will be assessed and monitored.  You can return to your normal activities as directed by your health care provider. This information is not intended to replace advice given to you by your health care provider. Make sure you discuss any questions you have with your health care provider. Document Released: 08/17/2010 Document Revised: 11/15/2015 Document Reviewed: 03/11/2014 Elsevier Interactive Patient Education  2018 [employer]  

## 2017-04-20 NOTE — Progress Notes (Signed)
Hematology and Oncology Follow Up Visit  [name] [id number] 06-27-1945 72 y.o. 09/08/2016   Principle Diagnosis:  Polycythemia vera -- hyperviscosity variant. 2. 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  Current Therapy:    Phlebotomy to maintain hemoglobin below 6.0     Interim History:  [name] is back for followup.  She got through the holidays without any difficulties.  Her husband is going to have the partial nephrectomy in February.  She does feel a little tired.  She has had no headaches.  She has had no shortness of breath.  There is been no nausea or vomiting.  She has had no fever.  There is been no rashes.  She has occasional palpitations.  She is markedly iron deficient.  We done this with phlebotomies.  Currently, her performance status is ECOG 1.    Medications:Allergies:  Allergies  Allergen Reactions  . Ethrane [Enflurane] Nausea And Vomiting  . Codeine Nausea Only  . Synthroid [Levothyroxine Sodium] Other (See Comments)    Not effective. Causes excessive sleepiness. Takes armour thyroid  . Calcium Channel Blockers Swelling, Palpitations and Hypertension  . Penicillins Rash    Past Medical History, Surgical history, Social history, and Family History were reviewed and updated.  Review of Systems: Review of Systems  Constitutional: Negative.   HENT: Negative.   Eyes: Negative.   Respiratory: Negative.   Cardiovascular: Negative.   Gastrointestinal: Negative.   Genitourinary: Negative.   Musculoskeletal: Positive for back pain, joint pain, myalgias and neck pain.  Skin: Negative.   Neurological: Positive for headaches.  Endo/Heme/Allergies: Negative.   Psychiatric/Behavioral: Negative.     Physical Exam:  weight is 162 lb (73.5 kg). Her oral temperature is 98.4 F (36.9 C). Her blood pressure is 138/50 (abnormal) and her pulse is 79. Her respiration is 20 and oxygen saturation is 100%.    Physical Exam  Constitutional: She is oriented to person, place, and time.  HENT:  Head: Normocephalic and atraumatic.  Mouth/Throat: Oropharynx is clear and moist.  Eyes: EOM are normal. Pupils are equal, round, and reactive to light.  Neck: Normal range of motion.  Cardiovascular: Normal rate, regular rhythm and normal heart sounds.  Pulmonary/Chest: Effort normal and breath sounds normal.  Abdominal: Soft. Bowel sounds are normal.  Musculoskeletal: Normal range of motion. She exhibits no edema, tenderness or deformity.  Lymphadenopathy:    She has no cervical adenopathy.  Neurological: She is alert and oriented to person, place, and time.  Skin: Skin is warm and dry. No rash noted. No erythema.  Psychiatric: She has a normal mood and affect. Her behavior is normal. Judgment and thought content normal.  Vitals reviewed.  .  Lab Results  Component Value Date   WBC 5.5 09/08/2016   HGB 6.0 (LL) 09/08/2016   HCT 24.6 (L) 09/08/2016   MCV 70 (L) 09/08/2016   PLT 167 Large & giant platelets 09/08/2016     Chemistry      Component Value Date/Time   NA 140 09/08/2016 1336   NA 138 03/24/2016 1306   K 3.4 09/08/2016 1336   K 3.3 (L) 03/24/2016 1306   CL 102 09/08/2016 1336   CO2 30 09/08/2016 1336   CO2 31 (H) 03/24/2016 1306   BUN 9 09/08/2016 1336   BUN 8.2 03/24/2016 1306   CREATININE 0.9 09/08/2016 1336   CREATININE 0.8 03/24/2016 1306      Component Value Date/Time   CALCIUM  8.9 09/08/2016 1336   CALCIUM 9.2 03/24/2016 1306   ALKPHOS 104 (H) 09/08/2016 1336   ALKPHOS 114 03/24/2016 1306   AST 24 09/08/2016 1336   AST 15 03/24/2016 1306   ALT 19 09/08/2016 1336   ALT 7 03/24/2016 1306   BILITOT 0.90 09/08/2016 1336   BILITOT 0.83 03/24/2016 1306      Impression and Plan: [name] is 72 year old female with polycythemia vera. She is very hyperviscous.  We will phlebotomize her today.  We had to make sure that we work around her husband's partial  nephrectomy.  We will plan to see her back in another 8 weeks.  She comes in every 2 weeks for lab work.  Volanda Napoleon, MD

## 2017-04-21 ENCOUNTER — Other Ambulatory Visit: Payer: Self-pay | Admitting: *Deleted

## 2017-04-21 DIAGNOSIS — I358 Other nonrheumatic aortic valve disorders: Secondary | ICD-10-CM

## 2017-04-21 DIAGNOSIS — G4700 Insomnia, unspecified: Secondary | ICD-10-CM

## 2017-04-21 DIAGNOSIS — D45 Polycythemia vera: Secondary | ICD-10-CM

## 2017-04-21 DIAGNOSIS — M62838 Other muscle spasm: Secondary | ICD-10-CM

## 2017-04-21 DIAGNOSIS — M797 Fibromyalgia: Secondary | ICD-10-CM

## 2017-04-21 LAB — IRON AND TIBC
Iron: 13 ug/dL — ABNORMAL LOW (ref 41–142)
Saturation Ratios: 3 % — ABNORMAL LOW (ref 21–57)
TIBC: 483 ug/dL — ABNORMAL HIGH (ref 236–444)
UIBC: 470 ug/dL

## 2017-04-21 LAB — RETICULOCYTES
RBC.: 3.95 MIL/uL (ref 3.70–5.45)
RETIC COUNT ABSOLUTE: 63.2 10*3/uL (ref 33.7–90.7)
Retic Ct Pct: 1.6 % (ref 0.7–2.1)

## 2017-04-21 LAB — FERRITIN: FERRITIN: 5 ng/mL — AB (ref 9–269)

## 2017-04-21 MED ORDER — OXYCODONE HCL ER 10 MG PO T12A: 10.0000 mg | EXTENDED_RELEASE_TABLET | Freq: Two times a day (BID) | ORAL | 0 refills | Status: AC

## 2017-04-21 MED ORDER — ALPRAZOLAM 0.5 MG PO TABS: 0.5000 mg | ORAL_TABLET | Freq: Two times a day (BID) | ORAL | 2 refills | Status: AC

## 2017-04-21 MED ORDER — OXYCODONE HCL ER 20 MG PO T12A: 20.0000 mg | EXTENDED_RELEASE_TABLET | Freq: Three times a day (TID) | ORAL | 0 refills | Status: AC

## 2017-04-21 MED ORDER — OXYCODONE HCL 15 MG PO TABS
15.0000 mg | ORAL_TABLET | Freq: Four times a day (QID) | ORAL | 0 refills | Status: AC | PRN
Start: 2017-04-21 — End: ?

## 2017-05-02 ENCOUNTER — Telehealth: Payer: Self-pay | Admitting: Family Medicine

## 2017-05-02 NOTE — Telephone Encounter (Signed)
[facility] called to inform Dr. Etter Sjogren that her patient was found deceased this am.

## 2017-05-04 ENCOUNTER — Inpatient Hospital Stay: Payer: Medicare Other

## 2017-05-05 NOTE — Telephone Encounter (Signed)
Noted Death certificate dropped off today 2/7 and filled out

## 2017-05-18 ENCOUNTER — Ambulatory Visit: Payer: Medicare Other | Admitting: Hematology & Oncology

## 2017-05-18 ENCOUNTER — Inpatient Hospital Stay: Payer: Medicare Other

## 2017-05-27 DIAGNOSIS — 419620001 Death: Secondary | SNOMED CT | POA: Diagnosis not present

## 2017-05-27 DEATH — deceased

## 2017-06-01 ENCOUNTER — Inpatient Hospital Stay: Payer: Medicare Other

## 2017-06-09 ENCOUNTER — Other Ambulatory Visit: Payer: Self-pay | Admitting: Nurse Practitioner

## 2017-06-15 ENCOUNTER — Ambulatory Visit: Payer: Medicare Other | Admitting: Hematology & Oncology

## 2017-06-15 ENCOUNTER — Inpatient Hospital Stay: Payer: Medicare Other

## 2017-06-29 ENCOUNTER — Other Ambulatory Visit: Payer: Medicare Other

## 2017-07-13 ENCOUNTER — Other Ambulatory Visit: Payer: Medicare Other

## 2017-07-27 ENCOUNTER — Other Ambulatory Visit: Payer: Medicare Other

## 2017-08-10 ENCOUNTER — Other Ambulatory Visit: Payer: Medicare Other

## 2017-08-24 ENCOUNTER — Other Ambulatory Visit: Payer: Medicare Other

## 2017-09-07 ENCOUNTER — Other Ambulatory Visit: Payer: Medicare Other

## 2017-09-21 ENCOUNTER — Other Ambulatory Visit: Payer: Medicare Other

## 2017-10-05 ENCOUNTER — Other Ambulatory Visit: Payer: Medicare Other

## 2017-10-11 ENCOUNTER — Ambulatory Visit: Payer: Medicare Other | Admitting: Family Medicine

## 2017-10-19 ENCOUNTER — Other Ambulatory Visit: Payer: Medicare Other

## 2017-11-02 ENCOUNTER — Other Ambulatory Visit: Payer: Medicare Other

## 2017-11-16 ENCOUNTER — Other Ambulatory Visit: Payer: Medicare Other

## 2017-11-30 ENCOUNTER — Other Ambulatory Visit: Payer: Medicare Other

## 2017-12-14 ENCOUNTER — Other Ambulatory Visit: Payer: Medicare Other

## 2017-12-28 ENCOUNTER — Other Ambulatory Visit: Payer: Medicare Other

## 2018-01-11 ENCOUNTER — Other Ambulatory Visit: Payer: Medicare Other

## 2018-01-25 ENCOUNTER — Other Ambulatory Visit: Payer: Medicare Other

## 2018-02-08 ENCOUNTER — Other Ambulatory Visit: Payer: Medicare Other

## 2018-02-22 ENCOUNTER — Other Ambulatory Visit: Payer: Medicare Other

## 2018-03-08 ENCOUNTER — Other Ambulatory Visit: Payer: Medicare Other

## 2018-03-23 ENCOUNTER — Other Ambulatory Visit: Payer: Medicare Other
